# Patient Record
Sex: Male | Born: 2000 | Race: Black or African American | Hispanic: No | Marital: Single
Health system: Southern US, Academic
[De-identification: ages and names within clinical notes are randomized; demographics above are authoritative.]

## PROBLEM LIST (undated history)

## (undated) ENCOUNTER — Encounter

## (undated) ENCOUNTER — Ambulatory Visit: Payer: PRIVATE HEALTH INSURANCE

## (undated) ENCOUNTER — Telehealth: Attending: Pediatrics | Primary: Pediatrics

## (undated) ENCOUNTER — Telehealth

## (undated) ENCOUNTER — Telehealth: Attending: Pediatric Gastroenterology | Primary: Pediatric Gastroenterology

## (undated) ENCOUNTER — Encounter: Attending: "Endocrinology | Primary: "Endocrinology

## (undated) ENCOUNTER — Non-Acute Institutional Stay
Payer: PRIVATE HEALTH INSURANCE | Attending: Student in an Organized Health Care Education/Training Program | Primary: Student in an Organized Health Care Education/Training Program

## (undated) ENCOUNTER — Ambulatory Visit

## (undated) ENCOUNTER — Telehealth: Attending: Family | Primary: Family

## (undated) ENCOUNTER — Inpatient Hospital Stay

## (undated) ENCOUNTER — Encounter
Attending: Student in an Organized Health Care Education/Training Program | Primary: Student in an Organized Health Care Education/Training Program

## (undated) ENCOUNTER — Encounter: Payer: PRIVATE HEALTH INSURANCE | Attending: Clinical | Primary: Clinical

## (undated) ENCOUNTER — Telehealth: Attending: Pediatric Pulmonology | Primary: Pediatric Pulmonology

## (undated) ENCOUNTER — Encounter: Attending: Family | Primary: Family

## (undated) ENCOUNTER — Encounter
Payer: PRIVATE HEALTH INSURANCE | Attending: Student in an Organized Health Care Education/Training Program | Primary: Student in an Organized Health Care Education/Training Program

## (undated) ENCOUNTER — Encounter: Attending: Pediatrics | Primary: Pediatrics

## (undated) ENCOUNTER — Ambulatory Visit
Payer: PRIVATE HEALTH INSURANCE | Attending: Student in an Organized Health Care Education/Training Program | Primary: Student in an Organized Health Care Education/Training Program

## (undated) ENCOUNTER — Encounter: Payer: PRIVATE HEALTH INSURANCE | Attending: Pediatric Gastroenterology | Primary: Pediatric Gastroenterology

## (undated) ENCOUNTER — Non-Acute Institutional Stay: Payer: PRIVATE HEALTH INSURANCE

## (undated) ENCOUNTER — Encounter: Attending: Pediatric Gastroenterology | Primary: Pediatric Gastroenterology

## (undated) ENCOUNTER — Encounter: Payer: PRIVATE HEALTH INSURANCE | Attending: Nutritionist | Primary: Nutritionist

## (undated) ENCOUNTER — Ambulatory Visit: Payer: PRIVATE HEALTH INSURANCE | Attending: Pediatric Gastroenterology | Primary: Pediatric Gastroenterology

## (undated) ENCOUNTER — Encounter: Attending: Anesthesiology | Primary: Anesthesiology

## (undated) ENCOUNTER — Ambulatory Visit: Payer: PRIVATE HEALTH INSURANCE | Attending: Family | Primary: Family

## (undated) ENCOUNTER — Telehealth: Attending: Certified Registered" | Primary: Certified Registered"

## (undated) ENCOUNTER — Encounter: Payer: PRIVATE HEALTH INSURANCE | Attending: Dermatology | Primary: Dermatology

## (undated) ENCOUNTER — Encounter: Attending: Certified Registered" | Primary: Certified Registered"

## (undated) ENCOUNTER — Encounter: Payer: PRIVATE HEALTH INSURANCE | Attending: Family | Primary: Family

## (undated) ENCOUNTER — Encounter: Payer: PRIVATE HEALTH INSURANCE | Attending: "Endocrinology | Primary: "Endocrinology

## (undated) DIAGNOSIS — I8500 Esophageal varices without bleeding: Secondary | ICD-10-CM

## (undated) DIAGNOSIS — K7460 Unspecified cirrhosis of liver: Secondary | ICD-10-CM

## (undated) DIAGNOSIS — Z944 Liver transplant status: Secondary | ICD-10-CM

## (undated) DIAGNOSIS — K200 Eosinophilic esophagitis: Secondary | ICD-10-CM

## (undated) DIAGNOSIS — R740 Nonspecific elevation of levels of transaminase and lactic acid dehydrogenase [LDH]: Secondary | ICD-10-CM

## (undated) DIAGNOSIS — J302 Other seasonal allergic rhinitis: Secondary | ICD-10-CM

## (undated) DIAGNOSIS — I8501 Esophageal varices with bleeding: Secondary | ICD-10-CM

## (undated) DIAGNOSIS — D573 Sickle-cell trait: Secondary | ICD-10-CM

## (undated) DIAGNOSIS — R7401 Elevation of levels of liver transaminase levels: Secondary | ICD-10-CM

## (undated) DIAGNOSIS — F329 Major depressive disorder, single episode, unspecified: Secondary | ICD-10-CM

## (undated) DIAGNOSIS — F32A Depression, unspecified: Secondary | ICD-10-CM

## (undated) DIAGNOSIS — T50902A Poisoning by unspecified drugs, medicaments and biological substances, intentional self-harm, initial encounter: Secondary | ICD-10-CM

## (undated) HISTORY — PX: TIPS PROCEDURE: SHX808

## (undated) HISTORY — PX: LIVER TRANSPLANT: SHX410

## (undated) HISTORY — PX: OTHER SURGICAL HISTORY: SHX169

---
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
## 2019-01-06 ENCOUNTER — Other Ambulatory Visit (HOSPITAL_COMMUNITY)
Admission: AD | Admit: 2019-01-06 | Discharge: 2019-01-06 | Disposition: A | Discharge status: Home / Self Care | Payer: Medicaid Other | Source: Ambulatory Visit | Attending: Pediatric Gastroenterology | Admitting: Pediatric Gastroenterology

## 2019-01-06 ENCOUNTER — Other Ambulatory Visit (HOSPITAL_COMMUNITY)
Admission: AD | Admit: 2019-01-06 | Payer: Medicaid Other | Source: Ambulatory Visit | Admitting: Pediatric Gastroenterology

## 2019-01-06 DIAGNOSIS — Z944 Liver transplant status: Secondary | ICD-10-CM | POA: Diagnosis present

## 2019-01-06 LAB — CBC WITH DIFFERENTIAL/PLATELET
Abs Immature Granulocytes: 0 10*3/uL (ref 0.00–0.07)
Basophils Absolute: 0 10*3/uL (ref 0.0–0.1)
Basophils Relative: 1 %
EOS ABS: 0 10*3/uL (ref 0.0–0.5)
EOS PCT: 1 %
HCT: 43.2 % (ref 39.0–52.0)
Hemoglobin: 14 g/dL (ref 13.0–17.0)
Immature Granulocytes: 0 %
Lymphocytes Relative: 29 %
Lymphs Abs: 0.4 10*3/uL — ABNORMAL LOW (ref 0.7–4.0)
MCH: 24.2 pg — ABNORMAL LOW (ref 26.0–34.0)
MCHC: 32.4 g/dL (ref 30.0–36.0)
MCV: 74.7 fL — ABNORMAL LOW (ref 80.0–100.0)
Monocytes Absolute: 0.3 10*3/uL (ref 0.1–1.0)
Monocytes Relative: 20 %
Neutro Abs: 0.7 10*3/uL — ABNORMAL LOW (ref 1.7–7.7)
Neutrophils Relative %: 49 %
Platelets: 190 10*3/uL (ref 150–400)
RBC: 5.78 MIL/uL (ref 4.22–5.81)
RDW: 13.2 % (ref 11.5–15.5)
WBC Morphology: INCREASED
WBC: 1.5 10*3/uL — ABNORMAL LOW (ref 4.0–10.5)
nRBC: 0 % (ref 0.0–0.2)

## 2019-01-06 LAB — COMPREHENSIVE METABOLIC PANEL
ALT: 109 U/L — ABNORMAL HIGH (ref 0–44)
AST: 89 U/L — ABNORMAL HIGH (ref 15–41)
Albumin: 4.1 g/dL (ref 3.5–5.0)
Alkaline Phosphatase: 293 U/L — ABNORMAL HIGH (ref 38–126)
Anion gap: 11 (ref 5–15)
BUN: 5 mg/dL — ABNORMAL LOW (ref 6–20)
CO2: 25 mmol/L (ref 22–32)
Calcium: 9.2 mg/dL (ref 8.9–10.3)
Chloride: 104 mmol/L (ref 98–111)
Creatinine, Ser: 1.15 mg/dL (ref 0.61–1.24)
GFR calc Af Amer: >60 mL/min (ref >60)
GFR calc non Af Amer: >60 mL/min (ref >60)
Glucose, Bld: 107 mg/dL — ABNORMAL HIGH (ref 70–99)
POTASSIUM: 4 mmol/L (ref 3.5–5.1)
Sodium: 140 mmol/L (ref 135–145)
Total Bilirubin: 0.8 mg/dL (ref 0.3–1.2)
Total Protein: 7.5 g/dL (ref 6.5–8.1)

## 2019-01-06 LAB — MAGNESIUM: Magnesium: 1.5 mg/dL — ABNORMAL LOW (ref 1.7–2.4)

## 2019-01-06 LAB — PHOSPHORUS: Phosphorus: 4.6 mg/dL (ref 2.5–4.6)

## 2019-01-06 LAB — GAMMA GT: GGT: 132 U/L — ABNORMAL HIGH (ref 7–50)

## 2019-01-08 LAB — TACROLIMUS LEVEL: Tacrolimus (FK506) - LabCorp: 14.7 ng/mL (ref 2.0–20.0)

## 2019-01-08 LAB — PATHOLOGIST SMEAR REVIEW

## 2019-01-11 ENCOUNTER — Encounter: Payer: Self-pay | Admitting: Pediatrics

## 2019-01-11 ENCOUNTER — Ambulatory Visit (INDEPENDENT_AMBULATORY_CARE_PROVIDER_SITE_OTHER): Payer: Medicaid Other | Admitting: Pediatrics

## 2019-01-11 ENCOUNTER — Emergency Department (HOSPITAL_COMMUNITY): Payer: Medicaid Other

## 2019-01-11 ENCOUNTER — Emergency Department (HOSPITAL_COMMUNITY)
Admission: EM | Admit: 2019-01-11 | Discharge: 2019-01-11 | Disposition: A | Discharge status: Home / Self Care | Payer: Medicaid Other | Attending: Emergency Medicine | Admitting: Emergency Medicine

## 2019-01-11 ENCOUNTER — Other Ambulatory Visit: Payer: Self-pay

## 2019-01-11 ENCOUNTER — Encounter (HOSPITAL_COMMUNITY): Payer: Self-pay

## 2019-01-11 VITALS — BP 98/78 | HR 112 | Temp 98.2°F | Resp 24 | Wt 122.2 lb

## 2019-01-11 DIAGNOSIS — D849 Immunodeficiency, unspecified: Secondary | ICD-10-CM

## 2019-01-11 DIAGNOSIS — Z7982 Long term (current) use of aspirin: Secondary | ICD-10-CM | POA: Diagnosis not present

## 2019-01-11 DIAGNOSIS — J069 Acute upper respiratory infection, unspecified: Secondary | ICD-10-CM | POA: Insufficient documentation

## 2019-01-11 DIAGNOSIS — R509 Fever, unspecified: Secondary | ICD-10-CM | POA: Diagnosis not present

## 2019-01-11 DIAGNOSIS — D899 Disorder involving the immune mechanism, unspecified: Secondary | ICD-10-CM

## 2019-01-11 DIAGNOSIS — Z79899 Other long term (current) drug therapy: Secondary | ICD-10-CM | POA: Insufficient documentation

## 2019-01-11 LAB — URINALYSIS, ROUTINE W REFLEX MICROSCOPIC
Bilirubin Urine: NEGATIVE
Glucose, UA: NEGATIVE mg/dL
Hgb urine dipstick: NEGATIVE
Ketones, ur: NEGATIVE mg/dL
Leukocytes,Ua: NEGATIVE
Nitrite: NEGATIVE
Protein, ur: NEGATIVE mg/dL
Specific Gravity, Urine: 1.012 (ref 1.005–1.030)
pH: 5 (ref 5.0–8.0)

## 2019-01-11 LAB — CBC WITH DIFFERENTIAL/PLATELET
Abs Immature Granulocytes: 0 10*3/uL (ref 0.00–0.07)
Basophils Absolute: 0 10*3/uL (ref 0.0–0.1)
Basophils Relative: 0 %
Eosinophils Absolute: 0 10*3/uL (ref 0.0–0.5)
Eosinophils Relative: 1 %
HCT: 42 % (ref 39.0–52.0)
Hemoglobin: 13.7 g/dL (ref 13.0–17.0)
Lymphocytes Relative: 46 %
Lymphs Abs: 0.9 10*3/uL (ref 0.7–4.0)
MCH: 24.2 pg — ABNORMAL LOW (ref 26.0–34.0)
MCHC: 32.6 g/dL (ref 30.0–36.0)
MCV: 74.1 fL — ABNORMAL LOW (ref 80.0–100.0)
Monocytes Absolute: 0.2 10*3/uL (ref 0.1–1.0)
Monocytes Relative: 9 %
NRBC: 0 % (ref 0.0–0.2)
Neutro Abs: 0.9 10*3/uL — ABNORMAL LOW (ref 1.7–7.7)
Neutrophils Relative %: 44 %
Platelets: 101 10*3/uL — ABNORMAL LOW (ref 150–400)
RBC: 5.67 MIL/uL (ref 4.22–5.81)
RDW: 13.2 % (ref 11.5–15.5)
WBC: 2 10*3/uL — ABNORMAL LOW (ref 4.0–10.5)

## 2019-01-11 LAB — COMPREHENSIVE METABOLIC PANEL
ALT: 93 U/L — ABNORMAL HIGH (ref 0–44)
AST: 100 U/L — ABNORMAL HIGH (ref 15–41)
Albumin: 3.7 g/dL (ref 3.5–5.0)
Alkaline Phosphatase: 314 U/L — ABNORMAL HIGH (ref 38–126)
Anion gap: 8 (ref 5–15)
BUN: 5 mg/dL — ABNORMAL LOW (ref 6–20)
CO2: 27 mmol/L (ref 22–32)
Calcium: 8.9 mg/dL (ref 8.9–10.3)
Chloride: 102 mmol/L (ref 98–111)
Creatinine, Ser: 1.2 mg/dL (ref 0.61–1.24)
GFR calc non Af Amer: >60 mL/min (ref >60)
Glucose, Bld: 96 mg/dL (ref 70–99)
POTASSIUM: 3.3 mmol/L — AB (ref 3.5–5.1)
Sodium: 137 mmol/L (ref 135–145)
Total Bilirubin: 1.5 mg/dL — ABNORMAL HIGH (ref 0.3–1.2)
Total Protein: 7 g/dL (ref 6.5–8.1)

## 2019-01-11 LAB — RESPIRATORY PANEL BY PCR
Adenovirus: NOT DETECTED
Bordetella pertussis: NOT DETECTED
CHLAMYDOPHILA PNEUMONIAE-RVPPCR: NOT DETECTED
Coronavirus 229E: NOT DETECTED
Coronavirus HKU1: NOT DETECTED
Coronavirus NL63: NOT DETECTED
Coronavirus OC43: NOT DETECTED
Influenza A: NOT DETECTED
Influenza B: NOT DETECTED
Metapneumovirus: NOT DETECTED
Mycoplasma pneumoniae: NOT DETECTED
Parainfluenza Virus 1: NOT DETECTED
Parainfluenza Virus 2: NOT DETECTED
Parainfluenza Virus 3: NOT DETECTED
Parainfluenza Virus 4: NOT DETECTED
RESPIRATORY SYNCYTIAL VIRUS-RVPPCR: NOT DETECTED
Rhinovirus / Enterovirus: NOT DETECTED

## 2019-01-11 LAB — PROTIME-INR
INR: 1.1 (ref 0.8–1.2)
Prothrombin Time: 14.3 seconds (ref 11.4–15.2)

## 2019-01-11 LAB — LACTIC ACID, PLASMA: LACTIC ACID, VENOUS: 1.4 mmol/L (ref 0.5–1.9)

## 2019-01-11 LAB — POC INFLUENZA A&B (BINAX/QUICKVUE)
Influenza A, POC: NEGATIVE
Influenza B, POC: NEGATIVE

## 2019-01-11 LAB — GROUP A STREP BY PCR: Group A Strep by PCR: NOT DETECTED

## 2019-01-11 LAB — MONONUCLEOSIS SCREEN: Mono Screen: NEGATIVE

## 2019-01-11 LAB — AMMONIA: Ammonia: 42 umol/L — ABNORMAL HIGH (ref 9–35)

## 2019-01-11 MED ORDER — SODIUM CHLORIDE 0.9 % IV SOLN
500.0000 mg | Freq: Once | INTRAVENOUS | Status: AC
  Administered 2019-01-11: 500 mg via INTRAVENOUS
  Filled 2019-01-11: qty 500

## 2019-01-11 MED ORDER — ONDANSETRON HCL 4 MG/2ML IJ SOLN
4.0000 mg | Freq: Once | INTRAMUSCULAR | Status: AC
  Administered 2019-01-11: 4 mg via INTRAVENOUS
  Filled 2019-01-11: qty 2

## 2019-01-11 MED ORDER — SODIUM CHLORIDE 0.9 % IV SOLN
1.0000 g | Freq: Once | INTRAVENOUS | Status: AC
  Administered 2019-01-11: 1 g via INTRAVENOUS
  Filled 2019-01-11: qty 10

## 2019-01-11 MED ORDER — AZITHROMYCIN 250 MG PO TABS: 250.0000 mg | ORAL_TABLET | Freq: Every day | ORAL | 0 refills | Status: DC

## 2019-01-11 MED ORDER — SODIUM CHLORIDE 0.9 % IV BOLUS
1000.0000 mL | Freq: Once | INTRAVENOUS | Status: AC
  Administered 2019-01-11: 1000 mL via INTRAVENOUS

## 2019-01-11 NOTE — Addendum Note (Signed)
Addended by: Theadore Nan on: 01/11/2019 02:11 PM   Modules accepted: Level of Service

## 2019-01-11 NOTE — ED Notes (Signed)
ED Provider at bedside. 

## 2019-01-11 NOTE — Discharge Instructions (Addendum)
Call the transplant center at [name] for a follow-up appointment on Thursday, January 13, 2019 or sooner as needed  Continue all of your other medications including your antirejection medications

## 2019-01-11 NOTE — ED Triage Notes (Addendum)
Pt here with mom- has been running fever- hx of liver transplant in 2017. Fever this morning 101.2. Liver enzymes were elevated per mom last week. Mother gave 325 of tylenol around 0830.

## 2019-01-11 NOTE — ED Notes (Signed)
Patient verbalizes understanding of discharge instructions. Opportunity for questioning and answers were provided. Armband removed by staff, pt discharged from ED ambulatory to home.  

## 2019-01-11 NOTE — Assessment & Plan Note (Signed)
-  Stable to transport by wheel-chair to ED

## 2019-01-11 NOTE — Progress Notes (Signed)
  Subjective:     Patient ID: Joel Peterson, male   DOB: Jul 01, 2001, 18 y.o.   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 He is accompanied by his mother today who provided the majority of history.  Mother says Joel Peterson has avoided the hospital for the last 6 months. His last appointment with [facility] ped GI was in December of 2019.  He is adherent to his Prograf and Myfortic for autoimmune hepatitis.  Onset of general malaise, severe sore thraot began 2 days ago. He also felt chills and mother reports a TMAX of 101.56F 2 hours prior to being seen (she did give 325 mg Tylenol prior to arrival). She called the transplant clinic and was advised to be seen here. Ronrico denies SOB, CP, N/V/D, abdominal pain.     Objective:   Vitals:   01/11/19 1006  BP: 98/78  Pulse: (!) 112  Resp: (!) 24  Temp: 98.2 F (36.8 C)   General: well nourished, well developed, NAD with non-toxic appearance HEENT: normocephalic, atraumatic, moist mucous membranes Neck: supple, non-tender without lymphadenopathy Cardiovascular: regular rate and rhythm without murmurs, rubs, or gallops Lungs: clear to auscultation bilaterally with normal work of breathing Abdomen: soft, non-tender, non-distended, normoactive bowel sounds Skin: warm, dry, no rashes or lesions, cap refill < 2 seconds Extremities: warm and well perfused, normal tone, no edema    Assessment:     Adair is presenting with fever with immunosuppression. He has generalized symptoms of feeling unwell with clear signs of infect based on vitals with tachycardia and tachypnea, MAP 85. He is afebrile at present but has received antipyretics. No history of volume loss. He is clearly tired appearing and needs evaluation in the ED for infectious work-up.    Plan:      Fever of unknown origin -Stable to transport by wheel-chair to ED  Durward Parcel, DO [facility], PGY-3

## 2019-01-11 NOTE — ED Provider Notes (Signed)
[facility] EMERGENCY DEPARTMENT Provider Note   CSN: [id number] Arrival date & time: 01/11/19  1100    History   Chief Complaint Chief Complaint  Patient presents with  . Fever    HPI Joel Peterson is a 18 y.o. male.     HPI Patient is an 18 year old male status post liver transplant in 2017 who presents the emergency department with complaints of feeling poorly over the past several days and was found this morning to have a fever of 101.2.  He was given Tylenol by mom prior to arrival.  He reports generalized fatigue and feeling poor.  He has mild sore throat.  Some cough.  No shortness of breath.  Denies nausea vomiting diarrhea.  Denies abdominal pain.  No urinary symptoms.  No recent sick contacts.  Patient was seen in his clinic and tested for flu and was flu negative.  Patient sent to the ER by his primary care team.  No recent change in his medications.  He is compliant with his antirejection meds.   Past Medical History:  Diagnosis Date  . Cirrhosis (HCC)   . Depression   . Eosinophilic esophagitis   . Esophageal varices with bleeding (HCC)   . Seasonal allergies   . Sickle cell trait (HCC)   . Status post liver transplant (HCC)   . Suicide attempt by drug ingestion (HCC)   . Transaminitis   . Varices, esophageal [facility])     Patient Active Problem List   Diagnosis Date Noted  . Immunocompromised (HCC) 01/11/2019  . Fever of unknown origin 01/11/2019  . Electrolyte disturbance 02/06/2018  . Ingestion of substance 01/07/2018  . Acute rejection of liver transplant (HCC) 08/20/2017  . History of suicide attempt 03/30/2017  . Liver transplant recipient [facility])   . Urinary retention   . Overdose 01/20/2017  . Severe episode of recurrent major depressive disorder, without psychotic features (HCC)   . Suicide attempt (HCC)   . Eosinophilic esophagitis 09/24/2016  . Status post liver transplant (HCC) 09/22/2016  . Adjustment disorder with  anxious mood 05/20/2016  . Anemia 05/12/2016  . Hypotension due to blood loss 05/12/2016  . Bleeding esophageal varices (HCC)   . Lactic acidosis   . Cirrhosis (HCC) 05/04/2016  . Hepatic cirrhosis (HCC) 05/03/2016  . Hematemesis 05/02/2016  . Orthostatic hypotension 05/02/2016  . Transaminitis 05/02/2016  . Depression 03/19/2016  . Sickle cell trait (HCC) 06/08/2014  . BMI (body mass index), pediatric, 5% to less than 85% for age 53/16/2015  . Failed vision screen 05/25/2014  . Acne 05/25/2014  . Allergic rhinitis 02/26/2014    Past Surgical History:  Procedure Laterality Date  . esophageal banding    . LIVER TRANSPLANT    . TIPS PROCEDURE          Home Medications    Prior to Admission medications   Medication Sig Start Date End Date Taking? Authorizing Provider  aspirin EC 81 MG tablet Take 81 mg by mouth daily.    [provider]  cholestyramine light (PREVALITE) 4 g packet Take 1 packet (4 g total) by mouth 2 (two) times daily. Patient not taking: Reported on 01/19/2017 08/08/16   Earley Favor, NP  escitalopram (LEXAPRO) 5 MG tablet Take 5 mg by mouth daily. 01/09/17 04/09/17  [provider]  lactulose (CHRONULAC) 10 GM/15ML solution Take by mouth 3 (three) times daily.    [provider]  mycophenolate (MYFORTIC) 360 MG TBEC EC tablet Take 720 mg  by mouth 2 (two) times daily.    [provider]  omeprazole (PRILOSEC) 20 MG capsule Take 40 mg by mouth. 05/06/16 01/19/17  [provider]  pantoprazole (PROTONIX) 40 MG tablet Take 1 tablet (40 mg total) by mouth daily. Patient not taking: Reported on 01/19/2017 05/02/16   Mittie Bodo, MD  ranitidine (ZANTAC) 150 MG tablet Take 1 tablet (150 mg total) by mouth at bedtime. Patient not taking: Reported on 01/19/2017 05/02/16   Mittie Bodo, MD  rifaximin (XIFAXAN) 550 MG TABS tablet Take 550 mg by mouth.    [provider]  spironolactone (ALDACTONE) 50 MG tablet  Take 50 mg by mouth. 05/06/16 06/05/16  [provider]  sucralfate (CARAFATE) 1 g tablet Take 1 tablet (1 g total) by mouth 4 (four) times daily -  with meals and at bedtime. Patient not taking: Reported on 01/19/2017 05/02/16   Mittie Bodo, MD  sulfamethoxazole-trimethoprim (BACTRIM,SEPTRA) 400-80 MG tablet Take 1 tablet by mouth 3 (three) times a week. Monday Wednesday Friday    [provider]  tacrolimus (PROGRAF) 1 MG capsule Take 4-5 mg by mouth 2 (two) times daily. 5 mg in the morning; 4 mg in evening    [provider]    Family History Family History  Problem Relation Age of Onset  . Hypertension Maternal Grandmother   . Diabetes Maternal Grandmother   . Hypertension Maternal Grandfather   . Diabetes Maternal Grandfather   . Diabetes Paternal Grandmother   . Hypertension Paternal Grandmother   . Diabetes Paternal Grandfather   . Hypertension Paternal Grandfather     Social History Social History   Tobacco Use  . Smoking status: Never Smoker  . Smokeless tobacco: Never Used  Substance Use Topics  . Alcohol use: No    Alcohol/week: 0.0 standard drinks    Frequency: Never  . Drug use: No     Allergies   Pollen extract   Review of Systems Review of Systems  All other systems reviewed and are negative.    Physical Exam Updated Vital Signs BP 103/73   Pulse 67   Temp 98.3 F (36.8 C) (Oral)   Resp (!) 23   SpO2 100%   Physical Exam Vitals signs and nursing note reviewed.  Constitutional:      Appearance: He is well-developed.  HENT:     Head: Normocephalic and atraumatic.     Comments: Uvula midline.  Posterior pharynx with mild erythema.  No tonsillar exudate.  Tolerating secretions.  Oral airway patent.  Anterior neck normal. Neck:     Musculoskeletal: Normal range of motion.  Cardiovascular:     Rate and Rhythm: Normal rate and regular rhythm.     Heart sounds: Normal heart sounds.  Pulmonary:     Effort:  Pulmonary effort is normal. No respiratory distress.     Breath sounds: Normal breath sounds.  Abdominal:     General: There is no distension.     Palpations: Abdomen is soft.     Tenderness: There is no abdominal tenderness.  Musculoskeletal: Normal range of motion.  Skin:    General: Skin is warm and dry.  Neurological:     Mental Status: He is alert and oriented to person, place, and time.  Psychiatric:        Judgment: Judgment normal.      ED Treatments / Results  Labs (all labs ordered are listed, but only abnormal results are displayed) Labs Reviewed  COMPREHENSIVE METABOLIC  PANEL - Abnormal; Notable for the following components:      Result Value   Potassium 3.3 (*)    BUN 5 (*)    AST 100 (*)    ALT 93 (*)    Alkaline Phosphatase 314 (*)    Total Bilirubin 1.5 (*)    All other components within normal limits  CBC WITH DIFFERENTIAL/PLATELET - Abnormal; Notable for the following components:   WBC 2.0 (*)    MCV 74.1 (*)    MCH 24.2 (*)    Platelets 101 (*)    Neutro Abs 0.9 (*)    All other components within normal limits  AMMONIA - Abnormal; Notable for the following components:   Ammonia 42 (*)    All other components within normal limits  GROUP A STREP BY PCR  CULTURE, BLOOD (ROUTINE X 2)  CULTURE, BLOOD (ROUTINE X 2)  CULTURE, GROUP A STREP ([facility])  RESPIRATORY PANEL BY PCR  PROTIME-INR  LACTIC ACID, PLASMA  MONONUCLEOSIS SCREEN  URINALYSIS, ROUTINE W REFLEX MICROSCOPIC    EKG None  Radiology Dg Chest 2 View  Result Date: 01/11/2019 CLINICAL DATA:  Fever with immunosuppression. Weakness. EXAM: CHEST - 2 VIEW COMPARISON:  Thoracic spine radiographs 05/16/2013 FINDINGS: The cardiomediastinal silhouette is within normal limits. The lungs are well inflated. There is mild hazy right infrahilar density. The left lung is clear. No pleural effusion or pneumothorax is identified. Surgical clips are present in the upper abdomen. No acute osseous abnormality is  seen. IMPRESSION: Hazy right infrahilar density which could reflect early pneumonia. Electronically Signed   By: Sebastian Ache M.D.   On: 01/11/2019 12:25    Procedures Procedures (including critical care time)  Medications Ordered in ED Medications  sodium chloride 0.9 % bolus 1,000 mL (1,000 mLs Intravenous New Bag/Given 01/11/19 1228)  ondansetron (ZOFRAN) injection 4 mg (4 mg Intravenous Given 01/11/19 1227)     Initial Impression / Assessment and Plan / ED Course  I have reviewed the triage vital signs and the nursing notes.  Pertinent labs & imaging results that were available during my care of the patient were reviewed by me and considered in my medical decision making (see chart for details).        Come the emergency department demonstrates possible hazy opacity which could represent pneumonia in the setting of fever cough and immunosuppression.  IV antibiotics now.  Case will be discussed with his transplant team at [facility].  Awaiting callback at this time.  Patient is flu negative.  Strep negative.  Mono negative.  Respiratory viral panel pending.  4:07 PM Spoke to transplant team at [facility]. They will follow up the patient on Thursday March 5th or sooner as needed.  They agree with azithromycin as an outpatient.  Viral respiratory panel pending.     Final Clinical Impressions(s) / ED Diagnoses   Final diagnoses:  Fever, unspecified fever cause  Upper respiratory tract infection, unspecified type    ED Discharge Orders    None       Azalia Bilis, MD 01/11/19 1609

## 2019-01-11 NOTE — Progress Notes (Signed)
History of liver transplant on immunosuppressive therapy with new fever documented today.  On exam patient seems tired does not currently have a fever. Review of recent labs shows ANC less than 1000, increasing heart rate increasing respiratory rate.   High risk patient for sepsis due to immunosuppression.  Transfer to emergency room for further evaluation, treatment and probable admission

## 2019-01-16 LAB — CULTURE, BLOOD (ROUTINE X 2)
Culture: NO GROWTH
Culture: NO GROWTH
Special Requests: ADEQUATE

## 2019-01-21 ENCOUNTER — Encounter
Admit: 2019-01-21 | Discharge: 2019-01-22 | Payer: PRIVATE HEALTH INSURANCE | Attending: Pediatric Gastroenterology | Primary: Pediatric Gastroenterology

## 2019-01-21 DIAGNOSIS — J302 Other seasonal allergic rhinitis: Principal | ICD-10-CM

## 2019-01-21 DIAGNOSIS — D573 Sickle-cell trait: Principal | ICD-10-CM

## 2019-01-21 DIAGNOSIS — F329 Major depressive disorder, single episode, unspecified: Principal | ICD-10-CM

## 2019-01-21 DIAGNOSIS — Z944 Liver transplant status: Principal | ICD-10-CM

## 2019-01-21 DIAGNOSIS — T8641 Liver transplant rejection: Principal | ICD-10-CM

## 2019-01-21 DIAGNOSIS — Z4823 Encounter for aftercare following liver transplant: Principal | ICD-10-CM

## 2019-01-26 DIAGNOSIS — Z79899 Other long term (current) drug therapy: Principal | ICD-10-CM

## 2019-01-26 DIAGNOSIS — B259 Cytomegaloviral disease, unspecified: Principal | ICD-10-CM

## 2019-01-26 DIAGNOSIS — Z944 Liver transplant status: Principal | ICD-10-CM

## 2019-01-26 MED ORDER — MYCOPHENOLATE SODIUM 180 MG TABLET,DELAYED RELEASE
ORAL_TABLET | Freq: Two times a day (BID) | ORAL | 11 refills | 0 days | Status: CP
Start: 2019-01-26 — End: ?

## 2019-01-26 MED ORDER — VALGANCICLOVIR 450 MG TABLET
ORAL_TABLET | Freq: Two times a day (BID) | ORAL | 2 refills | 0 days | Status: CP
Start: 2019-01-26 — End: 2019-04-26

## 2019-01-31 DIAGNOSIS — Z944 Liver transplant status: Principal | ICD-10-CM

## 2019-01-31 DIAGNOSIS — B259 Cytomegaloviral disease, unspecified: Principal | ICD-10-CM

## 2019-01-31 DIAGNOSIS — Z79899 Other long term (current) drug therapy: Principal | ICD-10-CM

## 2019-02-07 DIAGNOSIS — Z944 Liver transplant status: Principal | ICD-10-CM

## 2019-02-07 DIAGNOSIS — B259 Cytomegaloviral disease, unspecified: Principal | ICD-10-CM

## 2019-02-07 DIAGNOSIS — Z79899 Other long term (current) drug therapy: Principal | ICD-10-CM

## 2019-03-04 ENCOUNTER — Ambulatory Visit (INDEPENDENT_AMBULATORY_CARE_PROVIDER_SITE_OTHER): Payer: Medicaid Other | Admitting: Pediatrics

## 2019-03-04 ENCOUNTER — Other Ambulatory Visit: Payer: Self-pay

## 2019-03-04 ENCOUNTER — Encounter: Payer: Self-pay | Admitting: Pediatrics

## 2019-03-04 DIAGNOSIS — J029 Acute pharyngitis, unspecified: Secondary | ICD-10-CM | POA: Diagnosis not present

## 2019-03-04 MED ORDER — FLUTICASONE PROPIONATE 50 MCG/ACT NA SUSP: 1.0000 | Freq: Every day | NASAL | 12 refills | Status: AC

## 2019-03-04 MED ORDER — CETIRIZINE HCL 10 MG PO TABS: 10.0000 mg | ORAL_TABLET | Freq: Every day | ORAL | 11 refills | Status: AC | PRN

## 2019-03-04 NOTE — Progress Notes (Signed)
Virtual Visit via Video Note  I connected with Joel Peterson on 03/04/19 at  3:00 PM EDT by a video enabled telemedicine application and verified that I am speaking with the correct person using two identifiers.   Location of patient/parent: home   I discussed the limitations of evaluation and management by telemedicine and the availability of in person appointments.  I discussed that the purpose of this phone visit is to provide medical care while limiting exposure to the novel coronavirus.  The patient expressed understanding and agreed to proceed.  Reason for visit: sore throat  History of Present Illness: Throat hurts when he talks for the past week or so.  No pain with swallowing. He is hoarse also.  No  Cough, congestion, runny nose or fever.  No headaches or stomachaches.  No change in appetite or activity. He has been drinking tea with honey which helps a little.  Mom thinks he might have allergies. No medications tried at home.  Mom is worried because he has a history of liver transplant and is on chronic immunosuppression.  Mom reports that Joel Peterson recently was sick with fever and diagnosed with pneumonia about 2 months ago.  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  Mom also reports that Joel Peterson doesn't talk to her about his health or tell her what is going on until his symptoms get worse.   Observations/Objective: Well appearing adolescent male.  Limited exam of oropharynx with video shows mild erythema of the posterior oropharynx and no exudates.  Otherwise normal oral mucosa.  No nasal discharge.  Assessment and Plan: 18 year old male with history of liver transplant and chronic immune suppresion now with symptoms consistent with laryngitis - may be due to viral illness vs allergies.  Given history of allergic rhinitis, will trial cetirizine and flonase for possible allergic trigger.  No sign of systemic infection at this time.  Supportive cares, return precautions, and emergency procedures reviewed.  Follow Up Instructions: prn    I discussed the assessment and treatment plan with the patient and/or parent/guardian. They were provided an opportunity to ask questions and all were answered. They agreed with the plan and demonstrated an understanding of the instructions.   They were advised to call back or seek an in-person evaluation in the emergency room if the symptoms worsen or if the condition fails to improve as anticipated.  I provided 20 minutes of non-face-to-face time and 4 minutes of care coordination during this encounter I was located at clinic during this encounter.  Clifton Custard, MD

## 2019-07-18 DIAGNOSIS — Z944 Liver transplant status: Secondary | ICD-10-CM

## 2019-07-25 DIAGNOSIS — Z944 Liver transplant status: Secondary | ICD-10-CM

## 2019-07-25 DIAGNOSIS — B259 Cytomegaloviral disease, unspecified: Secondary | ICD-10-CM

## 2019-07-25 DIAGNOSIS — Z79899 Other long term (current) drug therapy: Secondary | ICD-10-CM

## 2019-07-26 DIAGNOSIS — Z944 Liver transplant status: Secondary | ICD-10-CM

## 2019-07-26 DIAGNOSIS — B259 Cytomegaloviral disease, unspecified: Secondary | ICD-10-CM

## 2019-07-26 DIAGNOSIS — Z79899 Other long term (current) drug therapy: Secondary | ICD-10-CM

## 2019-07-26 MED ORDER — TACROLIMUS 1 MG CAPSULE
ORAL_CAPSULE | Freq: Two times a day (BID) | ORAL | 11 refills | 30.00000 days | Status: CP
Start: 2019-07-26 — End: 2019-08-02

## 2019-07-26 MED ORDER — TACROLIMUS 5 MG CAPSULE
ORAL_CAPSULE | Freq: Two times a day (BID) | ORAL | 11 refills | 30.00000 days | Status: CP
Start: 2019-07-26 — End: 2019-08-02

## 2019-07-26 MED ORDER — MYCOPHENOLATE SODIUM 180 MG TABLET,DELAYED RELEASE
ORAL_TABLET | Freq: Two times a day (BID) | ORAL | 11 refills | 30.00000 days | Status: CP
Start: 2019-07-26 — End: 2019-08-02

## 2019-08-02 DIAGNOSIS — B259 Cytomegaloviral disease, unspecified: Secondary | ICD-10-CM

## 2019-08-02 DIAGNOSIS — Z944 Liver transplant status: Secondary | ICD-10-CM

## 2019-08-02 DIAGNOSIS — Z79899 Other long term (current) drug therapy: Secondary | ICD-10-CM

## 2019-08-02 MED ORDER — TACROLIMUS 1 MG CAPSULE
ORAL_CAPSULE | Freq: Two times a day (BID) | ORAL | 11 refills | 30.00000 days | Status: CP
Start: 2019-08-02 — End: 2019-08-03
  Filled 2019-08-03: qty 60, 30d supply, fill #0

## 2019-08-02 MED ORDER — MYCOPHENOLATE SODIUM 180 MG TABLET,DELAYED RELEASE
ORAL_TABLET | Freq: Two times a day (BID) | ORAL | 11 refills | 30.00000 days | Status: CP
Start: 2019-08-02 — End: 2019-08-03
  Filled 2019-08-03: qty 180, 30d supply, fill #0

## 2019-08-02 MED ORDER — TACROLIMUS 5 MG CAPSULE
ORAL_CAPSULE | Freq: Two times a day (BID) | ORAL | 11 refills | 30 days | Status: CP
Start: 2019-08-02 — End: 2019-08-03

## 2019-08-03 DIAGNOSIS — Z79899 Other long term (current) drug therapy: Secondary | ICD-10-CM

## 2019-08-03 DIAGNOSIS — B259 Cytomegaloviral disease, unspecified: Secondary | ICD-10-CM

## 2019-08-03 DIAGNOSIS — Z944 Liver transplant status: Secondary | ICD-10-CM

## 2019-08-03 MED ORDER — MYCOPHENOLATE SODIUM 180 MG TABLET,DELAYED RELEASE
ORAL_TABLET | Freq: Two times a day (BID) | ORAL | 11 refills | 30.00000 days | Status: SS
Start: 2019-08-03 — End: ?

## 2019-08-03 MED ORDER — TACROLIMUS 5 MG CAPSULE
ORAL_CAPSULE | Freq: Two times a day (BID) | ORAL | 11 refills | 30 days | Status: SS
Start: 2019-08-03 — End: ?

## 2019-08-03 MED FILL — TACROLIMUS 5 MG CAPSULE: 30 days supply | Qty: 60 | Fill #0 | Status: AC

## 2019-08-03 MED FILL — MYCOPHENOLATE SODIUM 180 MG TABLET,DELAYED RELEASE: 30 days supply | Qty: 180 | Fill #0 | Status: AC

## 2019-08-03 NOTE — Unmapped (Signed)
[facility]   Patient Onboarding/Medication Counseling    [name] Gross is a 18 y.o. male with liver transplant who I am counseling today on continuation of therapy.  I am speaking to the patient.    Verified patient's date of birth / HIPAA.    Specialty medication(s) to be sent: Transplant:  mycophenolic acid 180mg  and tacrolimus 5mg       Non-specialty medications/supplies to be sent: n/a      Medications not needed at this time: na         Myfortic (mycophenolic acid)    Medication & Administration     Dosage:   ? rx sent over says: 180mg  tablets take 2 tablets (360mg ) twice daily  ? Pt states he takes 180mg  tablets: 3 tablets (540mg ) twice daily  ? Contacting clinic to clarify today    Administration:   ? Take with or without food, although taking with food helps minimize GI side effects.  ? Swallow the pills whole, do not chew or crush    Adherence/Missed dose instructions:  ? Take a missed dose as soon as you think about it.  ? If it is less than 2 hours until your next dose, skip the missed dose and go back to your normal time.  ? Do not take 2 doses at the same time or extra doses.    Goals of Therapy     ? To prevent organ rejection    Side Effects & Monitoring Parameters     ? Common side effects  ? Back or joint pain  ? Constipation  ? Headache/dizziness  ? Not hungry  ? Stomach pain, diarrhea, constipation, gas, upset stomach, vomiting, nausea  ? Feeling tired or weak  ? Shakiness  ? Trouble sleeping  ? Increased risk of infection    ? The following side effects should be reported to the provider:  ? Allergic reaction  ? High blood sugar (confusion, feeling sleepy, more thirst, more hungry, passing urine more often, flushing, fast breathing, or breath that smells like fruit)  ? Electrolyte issues (mood changes, confusion, muscle pain or weakness, a heartbeat that does not feel normal, seizures, not hungry, or very bad upset stomach or throwing up)  ? High or low blood pressure (bad headache or dizziness, passing out, or change in eyesight)  ? Kidney issues (unable to pass urine, change in how much urine is passed, blood in the urine, or a big weight gain)  ? Skin (oozing, heat, swelling, redness, or pain), UTI and other infections   ? Chest pain or pressure  ? Abnormal heartbeat  ? Unexplained bleeding or bruising  ? Abnormal burning, numbness, or tingling  ? Muscle cramps,  ? Yellowing of skin or eyes    ? Monitoring parameters  ? Pregnancy test initially prior to treatment and 8-10 days later then as needed)  ? CBC weekly for first month then twice monthly for next 2 months, then monthly)  ? Monitor Renal and liver functions  ? Signs of organ rejection    Contraindications, Warnings, & Precautions     ? *This is a REMS drug and an FDA-approved patient medication guide will be printed with each dispensation  ? Black Box Warning: Infections   ? Black Box Warning: Lymphoproliferative disorders - risk of development of lymphoma and skin malignancy is increased  ? Black Box Warning: Use during pregnancy is associated with increased risks of first trimester pregnancy loss and congenital malformations.   ? Black  Box Warning: Females of reproductive potential should use contraception during treatment and for 6 weeks after therapy is discontinued  ? CNS depression  ? New or reactivated viral infections  ? Neutropenia  ? Male patients and/or their male partners should use effective contraception during treatment of the male patient and for at least 3 months after last dose.  ? Breastfeeding is not recommended during therapy and for 6 weeks after last dose    Drug/Food Interactions     ? Medication list reviewed in Epic. The patient was instructed to inform the care team before taking any new medications or supplements. no interactions noted that clinic is not already monitoring.   ? Do not take Echinacea while on this medication  ? Check with your doctor before getting any vaccinations (live or inactivated)    Storage, Handling Precautions, & Disposal     ? Store at room temperature  ? Keep away from children and pets  ? This drug is considered hazardous and should be handled as little as possible.  Wash hands before and after touching pills. If someone else helps with medication administration, they should wear gloves.      Current Medications (including OTC/herbals), Comorbidities and Allergies     Current Outpatient Medications   Medication Sig Dispense Refill   ??? ACCU-CHEK FASTCLIX LANCET DRUM Misc U TO CHECK BS 6 TIMES D  3   ??? blood sugar diagnostic Strp Dispense 100 blood glucose test strips, ok to sub any brand preferred by insurance/patient, use 3x/day.  Patient has accuchek guide. 100 strip 12   ??? famotidine (PEPCID) 20 MG tablet Take 1 tablet (20 mg total) by mouth Two (2) times a day. 60 tablet 0   ??? insulin glargine (LANTUS) 100 unit/mL injection Inject 0.08 mL (8 Units total) under the skin nightly. 3 mL 3   ??? insulin lispro (HUMALOG) 100 unit/mL injection Inject 0.12 mL (12 Units total) under the skin Three (3) times a day before meals. Take 6u with breakfast, 12u with lunch, & 8u with dinner. 9 mL 3   ??? insulin syringe-needle U-100 1 mL 31 gauge x 5/16 Syrg U QID  12   ??? lactulose (CHRONULAC) 10 gram/15 mL solution Take by mouth Three (3) times a day.     ??? magnesium oxide (MAG-OX) 400 mg (241.3 mg magnesium) tablet Take 2 tablets (800 mg total) by mouth Two (2) times a day. (Patient not taking: Reported on 10/13/2018) 120 tablet 11   ??? multivitamins, therapeutic with minerals 9 mg iron-400 mcg tablet Take 1 tablet by mouth daily. (Patient not taking: Reported on 10/13/2018) 60 tablet 0   ??? mycophenolate (MYFORTIC) 180 MG EC tablet Take 2 tablets (360 mg total) by mouth two (2) times a day. 120 tablet 11   ??? nystatin (MYCOSTATIN) 100,000 unit/mL suspension Take 5 mL (500,000 Units total) by mouth Four (4) times a day. (Patient not taking: Reported on 10/13/2018) 60 mL 0   ??? predniSONE (DELTASONE) 20 MG tablet Take 2 tablets (40 mg total) by mouth daily. (Patient not taking: Reported on 10/13/2018) 20 tablet 0   ??? tacrolimus (PROGRAF) 1 MG capsule Take 1 capsule (1 mg total) by mouth two (2) times a day. Take with 1 (5 mg) capsule for a total dose of 6 mg two times a day. 60 capsule 11   ??? tacrolimus (PROGRAF) 5 MG capsule Take 1 capsule (5 mg total) by mouth two (2) times a day. 60 capsule 11  Current Facility-Administered Medications   Medication Dose Route Frequency Provider Last Rate Last Dose   ??? albuterol (PROVENTIL HFA;VENTOLIN HFA) 90 mcg/actuation inhaler 2-4 puff  2-4 puff Inhalation Once Glorianne Manchester, MD           Allergies   Allergen Reactions   ??? Bee Pollen Rash     Pt has seasonal allergies that cause excessive sneezing and running nose- [name], NAII   ??? Pollen Extracts Rash     Pt has seasonal allergies that cause excessive sneezing and running noseCardell Peach       Patient Active Problem List   Diagnosis   ??? Adjustment disorder with anxious mood   ??? Recurrent major depressive disorder, in partial remission (CMS-HCC)   ??? Malnutrition of mild degree (CMS-HCC)   ??? History of liver transplant (CMS-HCC)   ??? Allergic rhinitis   ??? Sickle cell trait (CMS-HCC)   ??? Transaminitis   ??? History of suicide attempt   ??? 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 Acute rejection of liver transplant (CMS-HCC)   ??? Ingestion of multiple medications   ??? Liver transplant rejection (CMS-HCC)   ??? Anemia   ??? AKI (acute kidney injury) (CMS-HCC)   ??? Electrolyte disturbance   ??? Hypoalbuminemia   ??? H/O liver transplant (CMS-HCC)   ??? 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 High serum gamma glutamyl transferase (GGT)   ??? Steroid-induced hyperglycemia   ??? Pityrosporum folliculitis   ??? Bilateral knee swelling   ??? Cholangitis of transplanted liver (CMS-HCC)       Reviewed and up to date in Epic. Appropriateness of Therapy     Is medication and dose appropriate based on diagnosis? Yes    Baseline Quality of Life Assessment      How many days over the past month did your transplant keep you from your normal activities? 0    Financial Information     Medication Assistance provided: None Required    Anticipated copay of $0 reviewed with patient. Verified delivery address.    Delivery Information     Scheduled delivery date: 08/04/2019, however pt is aware that since shipping out of state could be longer    Expected start date: pt is currently already taking    Medication will be delivered via UPS to the prescription address in Epic [place].  This shipment will not require a signature. Got ok from [facility] and [facility] to ship to [place] address.     Explained the services we provide at [facility] and that each month we would call to set up refills.  Stressed importance of returning phone calls so that we could ensure they receive their medications in time each month.  Informed patient that we should be setting up refills 7-10 days prior to when they will run out of medication.  A pharmacist will reach out to perform a clinical assessment periodically.  Informed patient that a welcome packet and a drug information handout will be sent.      Patient verbalized understanding of the above information as well as how to contact the pharmacy at [phone] option 4 with any questions/concerns.  The pharmacy is open Monday through Friday 8:30am-4:30pm.  A pharmacist is available 24/7 via pager to answer any clinical questions they may have.    Patient Specific Needs     ? Does the patient have any physical,  cognitive, or cultural barriers? No    ? Patient prefers to have medications discussed with  Patient     ? Is the patient able to read and understand education materials at a high school level or above? Yes    ? Patient's primary language is  English     ? Is the patient high risk? Yes, patient taking a REMS drug     ? Does the patient require a Care Management Plan? No     ? Does the patient require physician intervention or other additional services (i.e. nutrition, smoking cessation, social work)? No      Thad Ranger  [facility] Specialty Pharmacist

## 2019-08-03 NOTE — Unmapped (Signed)
Call received from patient stating that he thought he needed to be admitted to hospital.  He reports black stools, feeling lightheaded and tired x 1 week.  He recently left his parent's house in [place] to stay with someone in [place].  Earlier today I coordinated with [facility] pharmacy to provide his medications while he resided there.      I discussed his options given that his insurance is [place] [employer]; discussed that if he went to a local ED they will see him and treat him regardless of insurance.  Alternatively if he felt he could tolerate the trip he could come to a [facility] hospital.  He confirmed that someone could drive him.  I identified [facility] in [place] as the nearest [facility] hospital to his current location, about 1 hour [address].  He stated he preferred this option.  I advised him to report to the ED there and to inform them of his [facility] transplant status.

## 2019-08-03 NOTE — Unmapped (Signed)
[facility]   Patient Onboarding/Medication Counseling    [name] Gross is a 18 y.o. male with liver transplant who I am counseling today on continuation of therapy.  I am speaking to the patient.    Verified patient's date of birth / HIPAA.    Specialty medication(s) to be sent: Transplant:  mycophenolic acid 180mg  and tacrolimus 5mg       Non-specialty medications/supplies to be sent: na      Medications not needed at this time: na       Prograf (tacrolimus)      [facility] Pharmacist Intervention    Type of intervention: mfg change of nti drug    Medication: tacrolimus    Problem: pt is unsure of current tac mfg    Intervention: counseled patient that we only have accord mfg available - he will need to contact clinic for possible labwork on date he switches to our mfg. I will message clinic as well    Follow up needed: n/a    Approximate time spent: 10 minutes    Thad Ranger   [facility] Specialty Pharmacist        Medication & Administration     Dosage:   rxs sent in say: 6mg  bid using 1mg  and 5mg  capsules  Pt states he only takes 5mg  bid using 5mg  caps only  Messaging coordinator and cpp now     Administration:   ? May take with or without food  ? Take 12 hours apart    Adherence/Missed dose instructions:  ? Take a missed dose as soon as you think about it.  ? If it is close to the time for your next dose, skip the missed dose and go back to your normal time.  ? Do not take 2 doses at the same time or extra doses.    Goals of Therapy     ? To prevent organ rejection    Side Effects & Monitoring Parameters     ? Common side effects  ? Dizziness  ? Fatigue  ? Headache  ? Stuffy nose or sore throat  ? Nausea, vomiting, stomach pain, diarrhea, constipation  ? Heartburn  ? Back or joint pain  ? Increased risk of infection    ? The following side effects should be reported to the provider:  ? Allergic reaction  ? Kidney issues (change in quantity or urine passed, blood in urine, or weight gain)  ? High blood pressure (dizziness, change in eyesight, headache)  ? Electrolyte issues (change in mood, confusion, muscle pain, or weakness)  ? Abnormal breathing  ? Shakiness  ? Unexplained bleeding or bruising (gums bleeding, blood in urine, nosebleeds, any abnormal bleeding)  ? Signs of infection  ? Skin changes (sores, paleness, new or changed bumps or moles)    ? Monitoring Parameters  ? Renal function  ? Liver function  ? Glucose levels  ? Blood pressure  ? Tacrolimus trough levels  ? Cardiac monitoring (for QT prolongation)      Contraindications, Warnings, & Precautions     ? Black Box Warning: Infections - immunosuppressant agents increase the risk of infection that may lead to hospitalization or death  ? Black Box Warning: Malignancy - immunosuppressant agents may be associated with the development of malignancies that may lead to hospitalization or death  ? Limit or avoid sun and ultraviolet light exposure, use appropriate sun protection  ? Myocardial hypertrophy -avoid use  in patients with congenital long QT syndrome  ? Diabetes mellitus - the risk for new-onset diabetes and insulin-dependent post-transplant diabetes mellitus is increased with tacrolimus use after transplantation  ? GI perforation  ? Hyperkalemia  ? Hypertension  ? Nephrotoxicity  ? Neurotoxicity  ? This is a narrow therapeutic index drug. Do not switch manufacturers without first talking to the provider.    Drug/Food Interactions     ? Medication list reviewed in Epic. The patient was instructed to inform the care team before taking any new medications or supplements. no interactions noted that clinic is not already monitoring.   ? Avoid alcohol  ? Avoid grapefruit or grapefruit juice  ? Avoid live vaccines    Storage, Handling Precautions, & Disposal     ? Store at room temperature  ? Keep away from children and pets      Current Medications (including OTC/herbals), Comorbidities and Allergies     Current Outpatient Medications   Medication Sig Dispense Refill   ??? ACCU-CHEK FASTCLIX LANCET DRUM Misc U TO CHECK BS 6 TIMES D  3   ??? blood sugar diagnostic Strp Dispense 100 blood glucose test strips, ok to sub any brand preferred by insurance/patient, use 3x/day.  Patient has accuchek guide. 100 strip 12   ??? famotidine (PEPCID) 20 MG tablet Take 1 tablet (20 mg total) by mouth Two (2) times a day. 60 tablet 0   ??? insulin glargine (LANTUS) 100 unit/mL injection Inject 0.08 mL (8 Units total) under the skin nightly. 3 mL 3   ??? insulin lispro (HUMALOG) 100 unit/mL injection Inject 0.12 mL (12 Units total) under the skin Three (3) times a day before meals. Take 6u with breakfast, 12u with lunch, & 8u with dinner. 9 mL 3   ??? insulin syringe-needle U-100 1 mL 31 gauge x 5/16 Syrg U QID  12   ??? lactulose (CHRONULAC) 10 gram/15 mL solution Take by mouth Three (3) times a day.     ??? magnesium oxide (MAG-OX) 400 mg (241.3 mg magnesium) tablet Take 2 tablets (800 mg total) by mouth Two (2) times a day. (Patient not taking: Reported on 10/13/2018) 120 tablet 11   ??? multivitamins, therapeutic with minerals 9 mg iron-400 mcg tablet Take 1 tablet by mouth daily. (Patient not taking: Reported on 10/13/2018) 60 tablet 0   ??? mycophenolate (MYFORTIC) 180 MG EC tablet Take 2 tablets (360 mg total) by mouth two (2) times a day. 120 tablet 11   ??? nystatin (MYCOSTATIN) 100,000 unit/mL suspension Take 5 mL (500,000 Units total) by mouth Four (4) times a day. (Patient not taking: Reported on 10/13/2018) 60 mL 0   ??? predniSONE (DELTASONE) 20 MG tablet Take 2 tablets (40 mg total) by mouth daily. (Patient not taking: Reported on 10/13/2018) 20 tablet 0   ??? tacrolimus (PROGRAF) 1 MG capsule Take 1 capsule (1 mg total) by mouth two (2) times a day. Take with 1 (5 mg) capsule for a total dose of 6 mg two times a day. 60 capsule 11   ??? tacrolimus (PROGRAF) 5 MG capsule Take 1 capsule (5 mg total) by mouth two (2) times a day. 60 capsule 11     Current Facility-Administered Medications   Medication Dose Route Frequency Provider Last Rate Last Dose   ??? albuterol (PROVENTIL HFA;VENTOLIN HFA) 90 mcg/actuation inhaler 2-4 puff  2-4 puff Inhalation Once Glorianne Manchester, MD           Allergies   Allergen  Reactions   ??? Bee Pollen Rash     Pt has seasonal allergies that cause excessive sneezing and running nose- [name], NAII   ??? Pollen Extracts Rash     Pt has seasonal allergies that cause excessive sneezing and running noseCardell Peach       Patient Active Problem List   Diagnosis   ??? Adjustment disorder with anxious mood   ??? Recurrent major depressive disorder, in partial remission (CMS-HCC)   ??? Malnutrition of mild degree (CMS-HCC)   ??? History of liver transplant (CMS-HCC)   ??? Allergic rhinitis   ??? Sickle cell trait (CMS-HCC)   ??? Transaminitis   ??? History of suicide attempt   ??? 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 Acute rejection of liver transplant (CMS-HCC)   ??? Ingestion of multiple medications   ??? Liver transplant rejection (CMS-HCC)   ??? Anemia   ??? AKI (acute kidney injury) (CMS-HCC)   ??? Electrolyte disturbance   ??? Hypoalbuminemia   ??? H/O liver transplant (CMS-HCC)   ??? 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 High serum gamma glutamyl transferase (GGT)   ??? Steroid-induced hyperglycemia   ??? Pityrosporum folliculitis   ??? Bilateral knee swelling   ??? Cholangitis of transplanted liver (CMS-HCC)       Reviewed and up to date in Epic.    Appropriateness of Therapy     Is medication and dose appropriate based on diagnosis? Yes    Baseline Quality of Life Assessment      How many days over the past month did your transplant keep you from your normal activities? none    Financial Information     Medication Assistance provided: None Required    Anticipated copay of $0 reviewed with patient. Verified delivery address.    Delivery Information     Scheduled delivery date: 08/04/2019- however pt is aware that med is going out of state so could possibly be longer    Expected start date: pt is currently already taking    Medication will be delivered via UPS to the prescription address in Epic [place].  This shipment will not require a signature.    Verified per [facility] and [facility] that ok to ship to [place] address for pt    Explained the services we provide at [facility] and that each month we would call to set up refills.  Stressed importance of returning phone calls so that we could ensure they receive their medications in time each month.  Informed patient that we should be setting up refills 7-10 days prior to when they will run out of medication.  A pharmacist will reach out to perform a clinical assessment periodically.  Informed patient that a welcome packet and a drug information handout will be sent.      Patient verbalized understanding of the above information as well as how to contact the pharmacy at [phone] option 4 with any questions/concerns.  The pharmacy is open Monday through Friday 8:30am-4:30pm.  A pharmacist is available 24/7 via pager to answer any clinical questions they may have.    Patient Specific Needs     ? Does the patient have any physical, cognitive, or cultural barriers? No    ? Patient prefers to have medications discussed with  Patient     ? Is the patient able to read and understand education materials at a high school level  or above? Yes    ? Patient's primary language is  English     ? Is the patient high risk? Yes, patient taking a REMS drug     ? Does the patient require a Care Management Plan? No     ? Does the patient require physician intervention or other additional services (i.e. nutrition, smoking cessation, social work)? No      Thad Ranger  [facility] Specialty Pharmacist

## 2019-08-03 NOTE — Unmapped (Signed)
[facility] Specialty Medication Referral: No PA required    Medication (Brand/Generic): Mycophenolate 180 mg tablets and Tacrolimus 1 mg and 5 mg capsules    Initial Benefits Investigation Claim completed with resulted information below:  No PA required  Patient ABLE to fill at [facility] Company:  [facility]  Anticipated Copay: $0.00    As Co-pay is under $25 defined limit, per policy there will be no further investigation of need for financial assistance at this time unless patient requests. This referral has been communicated to the provider and handed off to the [facility] team for further processing and filling of prescribed medication.   ______________________________________________________________________  Please utilize this referral for viewing purposes as it will serve as the central location for all relevant documentation and updates.

## 2019-08-03 NOTE — Unmapped (Signed)
Pt request for RX Refill

## 2019-08-04 ENCOUNTER — Encounter
Admit: 2019-08-04 | Discharge: 2019-09-10 | Disposition: A | Discharge status: Home / Self Care | Payer: PRIVATE HEALTH INSURANCE | Source: Other Acute Inpatient Hospital | Attending: Student in an Organized Health Care Education/Training Program | Admitting: Pediatric Gastroenterology

## 2019-08-04 ENCOUNTER — Encounter
Admit: 2019-08-04 | Discharge: 2019-09-10 | Disposition: A | Discharge status: Home / Self Care | Payer: PRIVATE HEALTH INSURANCE | Source: Other Acute Inpatient Hospital | Attending: Anesthesiology | Admitting: Pediatric Gastroenterology

## 2019-08-04 ENCOUNTER — Ambulatory Visit
Admit: 2019-08-04 | Discharge: 2019-09-10 | Disposition: A | Discharge status: Home / Self Care | Payer: PRIVATE HEALTH INSURANCE | Source: Other Acute Inpatient Hospital | Admitting: Pediatric Gastroenterology

## 2019-08-04 ENCOUNTER — Encounter
Admit: 2019-08-04 | Discharge: 2019-09-10 | Disposition: A | Discharge status: Home / Self Care | Payer: PRIVATE HEALTH INSURANCE | Source: Other Acute Inpatient Hospital | Admitting: Pediatric Gastroenterology

## 2019-08-04 LAB — CBC W/ AUTO DIFF
BASOPHILS ABSOLUTE COUNT: 0 10*9/L (ref 0.0–0.1)
EOSINOPHILS ABSOLUTE COUNT: 0.2 10*9/L (ref 0.0–0.4)
EOSINOPHILS RELATIVE PERCENT: 2.6 %
HEMATOCRIT: 37.1 % — ABNORMAL LOW (ref 41.0–53.0)
HEMOGLOBIN: 12 g/dL — ABNORMAL LOW (ref 13.5–17.5)
LARGE UNSTAINED CELLS: 2 % (ref 0–4)
LYMPHOCYTES ABSOLUTE COUNT: 2.6 10*9/L (ref 1.5–5.0)
LYMPHOCYTES RELATIVE PERCENT: 44.3 %
MEAN CORPUSCULAR HEMOGLOBIN CONC: 32.4 g/dL (ref 31.0–37.0)
MEAN CORPUSCULAR HEMOGLOBIN: 25.7 pg — ABNORMAL LOW (ref 26.0–34.0)
MEAN CORPUSCULAR VOLUME: 79.4 fL — ABNORMAL LOW (ref 80.0–100.0)
MEAN PLATELET VOLUME: 8.1 fL (ref 7.0–10.0)
MONOCYTES ABSOLUTE COUNT: 0.4 10*9/L (ref 0.2–0.8)
MONOCYTES RELATIVE PERCENT: 7.4 %
NEUTROPHILS ABSOLUTE COUNT: 2.5 10*9/L (ref 2.0–7.5)
NEUTROPHILS RELATIVE PERCENT: 43 %
PLATELET COUNT: 195 10*9/L (ref 150–440)
RED CELL DISTRIBUTION WIDTH: 21.9 % — ABNORMAL HIGH (ref 12.0–15.0)
WBC ADJUSTED: 5.9 10*9/L (ref 4.5–11.0)

## 2019-08-04 LAB — COMPREHENSIVE METABOLIC PANEL
ALBUMIN: 2.5 g/dL — ABNORMAL LOW (ref 3.5–5.0)
ALKALINE PHOSPHATASE: 1499 U/L — ABNORMAL HIGH (ref 65–260)
ALT (SGPT): 424 U/L — ABNORMAL HIGH (ref <50)
ANION GAP: 5 mmol/L — ABNORMAL LOW (ref 7–15)
AST (SGOT): 748 U/L — ABNORMAL HIGH (ref 19–55)
BILIRUBIN TOTAL: 15.5 mg/dL — ABNORMAL HIGH (ref 0.0–1.2)
BLOOD UREA NITROGEN: 8 mg/dL (ref 7–21)
BUN / CREAT RATIO: 12
CHLORIDE: 104 mmol/L (ref 98–107)
CO2: 23 mmol/L (ref 22.0–30.0)
CREATININE: 0.66 mg/dL — ABNORMAL LOW (ref 0.70–1.30)
EGFR CKD-EPI NON-AA MALE: >90 mL/min/{1.73_m2} (ref >=60)
GLUCOSE RANDOM: 78 mg/dL (ref 70–179)
POTASSIUM: 3.3 mmol/L — ABNORMAL LOW (ref 3.5–5.0)
PROTEIN TOTAL: 9.8 g/dL — ABNORMAL HIGH (ref 6.5–8.3)
SODIUM: 132 mmol/L — ABNORMAL LOW (ref 135–145)

## 2019-08-04 LAB — URINALYSIS
BLOOD UA: NEGATIVE
KETONES UA: NEGATIVE
LEUKOCYTE ESTERASE UA: NEGATIVE
NITRITE UA: NEGATIVE
PH UA: 6 (ref 5.0–9.0)
RBC UA: 2 /HPF (ref <=3)
SPECIFIC GRAVITY UA: 1.024 (ref 1.003–1.030)
SQUAMOUS EPITHELIAL: <1 /HPF (ref 0–5)
UROBILINOGEN UA: 4 — AB
WBC UA: 2 /HPF (ref <=2)

## 2019-08-04 LAB — BASIC METABOLIC PANEL
ANION GAP: 4 mmol/L — ABNORMAL LOW (ref 7–15)
BLOOD UREA NITROGEN: 8 mg/dL (ref 7–21)
BUN / CREAT RATIO: 12
CALCIUM: 7.9 mg/dL — ABNORMAL LOW (ref 8.5–10.2)
CO2: 24 mmol/L (ref 22.0–30.0)
CREATININE: 0.67 mg/dL — ABNORMAL LOW (ref 0.70–1.30)
EGFR CKD-EPI AA MALE: >90 mL/min/{1.73_m2} (ref >=60)
EGFR CKD-EPI NON-AA MALE: >90 mL/min/{1.73_m2} (ref >=60)
GLUCOSE RANDOM: 96 mg/dL (ref 70–179)
SODIUM: 135 mmol/L (ref 135–145)

## 2019-08-04 LAB — MAGNESIUM
Magnesium:MCnc:Pt:Ser/Plas:Qn:: 1.8
Magnesium:MCnc:Pt:Ser/Plas:Qn:: 2

## 2019-08-04 LAB — COLOR

## 2019-08-04 LAB — PHOSPHORUS: Phosphate:MCnc:Pt:Ser/Plas:Qn:: 3.6

## 2019-08-04 LAB — MEAN PLATELET VOLUME: Lab: 8.1

## 2019-08-04 LAB — CO2: Carbon dioxide:SCnc:Pt:Ser/Plas:Qn:: 23

## 2019-08-04 LAB — TARGET CELLS

## 2019-08-04 LAB — SLIDE REVIEW

## 2019-08-04 LAB — SODIUM: Sodium:SCnc:Pt:Ser/Plas:Qn:: 135

## 2019-08-04 LAB — PROTIME-INR: PROTIME: 19 s — ABNORMAL HIGH (ref 10.2–13.1)

## 2019-08-04 LAB — HEPATITIS B CORE IGM ANTIBODY: Hepatitis B virus core Ab.IgM:PrThr:Pt:Ser:Ord:: NONREACTIVE

## 2019-08-04 LAB — PROTIME: Coagulation tissue factor induced:Time:Pt:PPP:Qn:Coag: 19 — ABNORMAL HIGH

## 2019-08-04 LAB — HEPATITIS PANEL, ACUTE: HEPATITIS A IGM ANTIBODY: NONREACTIVE

## 2019-08-04 NOTE — Unmapped (Signed)
Pediatric Transfer Center Request Note    Requesting (OSH) Physician: [facility]: [facility]    Requested Service: peds GI    Reason for Transfer: jaundice, elevated bilirubin, homelessness, noncompliance    ----------------------------------------------------------------------------------------------------------------------  Brief History/Clinical Course: [name] Gross is a 18 year old followed by Peds GI s/p liver transplant in 2017, been homeless for the last 5 months, noncompliant with medications (takes every 3-4 days) who is now jaundiced with ascites in belly. Hypotensive (SBP 80) on arrival, reportedly not eating or drinking well (nothing in last 3 days). Given 1L NS bolus via IV and some potassium supplementation. Did endorse blood in stool, but hemoccult negative.    Objective Data:  Weight: ?55-60kg? No weight in computer  Vitals: HR 84, RR 18, T 99.58F, sats 99%, BP 98/52  Current respiratory support: RA  Access: PIV    Labs: bilirubin 16.6, AST 572, ALT 338, alk phos 1046, ammonia 43, CBC: WBC 6, Hgb 12, Plt 185, PT 17.4, INR 1.4, PTT 35.9, albumin 2.3, K 2.8, rapid COVID pending, marijuana positive  Imaging: CT abdomen: trace ascites, peripheral adenopathy    ----------------------------------------------------------------------------------------------------------------------  Transport method: ground    Plan Upon Arrival: likely will need rejection protocol, will need to talk over with Dr. Melrose Nakayama; will also need discussion of risk factors while homeless (drug use, sexual history, etc), will need HIV screen, UDS, nutrition consult for malnutrition/concern of refeeding    Bed Type: 5CH, acute    Accepting Service: PMG, Dr. Lesia Sago, MD  Pediatric Hospital Medicine  Pager [phone]    August 03, 2019 11:27 PM

## 2019-08-04 NOTE — Unmapped (Signed)
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 Of note, he has a hx of homelessness x past ~5 months, difficulty with medication adherence and food access.     Anthropometrics:  Current Wt: 51.8 kg (114 lb 3.2 oz) 2 %ile (Z= -2.16) based on CDC (Boys, 2-20 Years) weight-for-age data using vitals from 08/04/2019.  Length or Height: 174.6 cm (5' 8.74) 38 %ile (Z= -0.30) based on CDC (Boys, 2-20 Years) Stature-for-age data based on Stature recorded on 08/04/2019.  Current BMI:  Body mass index is 16.99 kg/m??. <1 %ile (Z= -2.87) based on CDC (Boys, 2-20 Years) BMI-for-age based on BMI available as of 08/04/2019.     Admission Wt: 51.8 kg (114 lb 3.2 oz)  Ideal Body Wt: 69.2 kg (BMI/age at 50%ile)    Growth velocity/trends: -6.7 kg (11%) since 10/13/2018    Nutrition-Focused Physical Exam:    Nutrition Evaluation  Overall Impressions: Unable to perform Nutrition-Focused Physical Exam at this time due to (comment)(deferred until next visit) (08/04/19 1147)    Nutritionally Relevant Data:  Meds: Nutritionally-relevant medications reviewed. Medications include famotidine, methylprednisolone, vitamin K 10 mg x 1.   IV Fluids: D5NS at 100 mL/hr  Labs:  Reviewed 08/04/2019 labs. Noted Na 132 (L), K 3.3 (L), total bilirubin 15.5 (H), alk phos 1499 (H), PT 19.0 (H), Ca 7.7 (L), albumin 2.5    Emesis: none documented   Stools: none documented  UOP: none documented    Home Nutrition History:  Food Allergies/Intolerances: NKFA per chart review     PO Diet: Regular diet PO ad lib with hx of food insecurity and poor appetite    Current Medical Nutrition Therapy:  Enteral/Parenteral Access: PIV    Pt is currently NPO    Estimated Nutrient Needs:  Nutrition Calculation Weight: Current  Calculation Method: Catch up growth  Calculation Consideration : Catch up growth, Disease State    Estimated Energy Needs: (45-50 kcal/kg)  Total Protein Estimated Needs: 1.5-2 grams/kg  Total Fluid Estimated Needs: Per Holliday-Segar(41 mL/kg or per medical team)    Weight and Growth Goal:  Recommended body mass index (BMI): 25-50 %    Pediatric AND/ASPEN Malnutrition Screening:  Primary Indicator of Malnutrition  Weight loss (43-59 years of age): 10% of usual body weight, indicating SEVERE protein-calorie malnutrition    Chronicity: Chronic (>/equal to 3 months)    Etiology  Illness-related: Decreased nutrient intake, Increased nutrient requirement  Non-Illness related: Decreased nutrient intake due to limited resources, Decreased nutrient intake due to limited access to food    Overall Impression: Patient meets criteria for SEVERE protein-calorie malnutrition (08/04/19 1120)    Nutrition Assessment:  ?? Pt is not meeting established goals for growth. He meets criteria for severe malnutrition in the setting of poor intake, possibly compounded by decreased appetite in the setting of liver disease. Nutrition status may be worse than appears from BMI/age given ascites.   ?? Pt is at mild risk of refeeding syndrome given BMI/age z-score change >1, mild hypokalemia on admission, and hx of food insecurity x past several months. Micronutrient supplementation  and lyte monitoring indicated.   ?? Reviewed labs, noted elevated alk phos likely due to liver disease vs metabolic bone disease. Pt receiving vitamin K supplementation for elevated PT. Team planning on checking INR tonight.   ?? Pt is at increased risk of fat-soluble vitamin deficiencies. For now will plan to start regular adult multivitamin to provide more minerals due to risk of refeeding, and will determine need to transition to water-miscible formulation based on additional labs.   ?? Pt has increased Ca++ and vitamin D needs with steroid therapy.    Nutrition Interventions/Recommendations:   ?? Advance diet when medically feasible.   ?? Refeeding syndrome prophylaxis/monitoring:   ?? Daily adult MVI with minerals tablet  ?? Thiamine 100 mg/day x 3 days  ?? Daily BMP, mag, phos x 3 days. Replete lytes aggressively.   ?? Consider additional K+ repletion today and adjust dose PRN  ?? Additional micronutrient supplementation:  ?? Cholecalciferol 1000 units/day  ?? Calcium 300 mg BID  ?? Additional recommended nutrition labs: 25-OH vitamin D  ?? Daily weights.   ?? Consider trending abdominal girth.   ?? Consider social work consult.   ?? Service RD will continue to monitor hospital course and update nutrition POC as needed.     Loraine Maple, MPH, RD, LDN  Pager: [phone]

## 2019-08-04 NOTE — Unmapped (Signed)
[place] INTERVENTIONAL RADIOLOGY - General Biopsy Consultation Note      Requesting Attending Physician: Glorianne Manchester, *  Service Requesting Consult: Ped Gastroenterology [facility])    Date of Service: 08/04/2019  Consulting Interventional Radiologist: Dr. Melynda Ripple    Subjective:       Biopsy Site:  Transplant liver, non-target    HPI:  Mr. Duplantis is a 18 y.o. male with hx of OLT (10/17) c/b multiple episodes of rejection (last biopsy confirmed 06/30/18). Hx of primary sclerosing cholangitis. He presents this admission with multiple episodes black stools, abdominal pain and jaundice concerning for acute rejection. He has had dispositional problems and has not been taking his transplant medications for the last month. VIR consulted for repeat biopsy.    Objective:      Pertinent Imaging:  [place] 08/04/19, reviewed by Dr. Melynda Ripple    Pertinent Laboratory Values:  WBC   Date Value Ref Range Status   08/04/2019 5.9 4.5 - 11.0 10*9/L Final   02/11/2019 2.8 (L) 3.4 - 10.8 x10E3/uL Final     HGB   Date Value Ref Range Status   08/04/2019 12.0 (L) 13.5 - 17.5 g/dL Final   08/65/7846 96.2 13.0 - 17.7 g/dL Final     Hemoglobin   Date Value Ref Range Status   09/22/2016 9.6 (L) 13.5 - 17.5 g/dL Final     Comment:     Point of Care Testing performed at the point of care by trained personnel per documented policies.     HCT   Date Value Ref Range Status   08/04/2019 37.1 (L) 41.0 - 53.0 % Final   02/11/2019 40.4 37.5 - 51.0 % Final     Platelet   Date Value Ref Range Status   08/04/2019 195 150 - 440 10*9/L Final   02/11/2019 197 150 - 450 x10E3/uL Final     INR   Date Value Ref Range Status   08/04/2019 1.64  Final     Creatinine   Date Value Ref Range Status   08/04/2019 0.66 (L) 0.70 - 1.30 mg/dL Final   95/28/4132 4.40 (H) 0.76 - 1.27 mg/dL Final       Anticoaguation: NO  If yes, type of anticoagulation     Allergies:     Allergies   Allergen Reactions   ??? Bee Pollen Rash     Pt has seasonal allergies that cause excessive sneezing and running nose- [name], NAII   ??? Pollen Extracts Rash     Pt has seasonal allergies that cause excessive sneezing and running noseCardell Peach       Physical Exam:    Vitals:    08/04/19 0825   BP: 90/48   Pulse: 70   Resp: 16   Temp: 37.2 ??C (99 ??F)   SpO2: 100%       Assessment:     Mr. Russey is a 18 y.o. male with history of liver transplant and multiple episodes of rejection. He presents with concern for rejection, VIR consulted for repeat non-target liver biopsy.  Pertinent history, imaging and laboratory values in patient's medical record have been reviewed.     Plan/Recommendations:         - VIR recommends proceeding with biopsy of liver percutaneous with Ultrasound.  - Anticipated procedure date: 08/04/19  - Please make NPO night prior to procedure  - Please ensure recent CBC, Creatinine, and INR are available    Informed Consent:  This procedure has been fully reviewed  with the patient/patient???s authorized representative. The risks, benefits and alternatives have been explained, and the patient/patient???s authorized representative has consented to the procedure.  --The patient will accept blood products in an emergent situation.  --The patient does not have a Do Not Resuscitate order in effect.    The patient was discussed with Dr. Mittie Bodo, August 04, 2019, 12:26 PM

## 2019-08-04 NOTE — Unmapped (Addendum)
Care Management  Initial Transition Planning Assessment    CAT: SW met with patient in room. SW wore mask and face shield. SW waiting for confirmation as to whether patient will be covered by Transplant team for SW/CM or not. Patient was dancing in his room when SW entered. He was engaged in conversation with this Clinical research associate. Patient has significant MH Hx but at this time does not endorse any MH issues or SI.               General  Care Manager assessed the patient by : In person interview with patient, Medical record review  Orientation Level: Oriented X4  Who provides care at home?: N/A  Reason for referral: Discharge Planning, Psychosocial/Community Resource Concerns    Contact/Decision Maker  Patient wants all emergency contacts removed on file, keeping only his personal cell phone number and no emergency contacts.    Patient Information  Lives with: Other (Comment)(Currently Homeless)   Per patient, he was living with his grandmother, whose address he put on file today for mail/M'caid purposes. He left her home about a 2 weeks ago with intent to move to [place] but it did not work out. He is now back in [place] with no source of income, no family support, and no friends, and nowhere to live.     When asked about his plans, patient requested homeless shelter information.     Type of Residence: Homeless     Location/Detail: Patient requests his Grandmother's home be put on file for mail receipt. [address] in [place]    Support Systems: None    Responsibilities/Dependents at home?: No    Home Care services in place prior to admission?: No    Equipment Currently Used at Home: none    Currently receiving outpatient dialysis?: No    Financial Information    Need for financial assistance?: Yes(PAtient reports no source of income.)  Type of financial assistance required: [employer]    Social Determinants of Health  Social Determinants of Health were addressed in provider documentation.  Please refer to patient history.    Discharge Needs Assessment  Concerns to be Addressed: basic needs, discharge planning, financial/insurance, homelessness, mental health    Clinical Risk Factors: Principal Diagnosis: Cancer, Stroke, COPD, Heart Failure, AMI, Pneumonia, Joint Replacment    Barriers to taking medications: Yes (Comment)(Patient has not had money to eat and has been travelling with all his possessions in suitcases - homeless. Patient has not been taking meds due to moving around all the time and having no food.)    Prior overnight hospital stay or ED visit in last 90 days: No    Readmission Within the Last 30 Days: no previous admission in last 30 days    Anticipated Changes Related to Illness: (At this time, patient is unable to care for his basic needs.)    Equipment Needed After Discharge: none    Discharge Facility/Level of Care Needs: other (see comments)(TBD)    Readmission  Risk of Unplanned Readmission Score: UNPLANNED READMISSION SCORE: 10%  Predictive Model Details           10% (Low) Factors Contributing to Score   Calculated 08/04/2019 12:04 26% Number of active Rx orders is 22   [facility] Risk of Unplanned Readmission Model 16% Latest calcium is low (7.7 mg/dL)     57% Diagnosis of electrolyte disorder is present     11% Imaging order is present in last 6 months  10% Latest hemoglobin is low (12.0 g/dL)     8% Diagnosis of deficiency anemia is present     7% Active corticosteroid Rx order is present     4% Charlson Comorbidity Index is 2     Readmitted Within the Last 30 Days? (No if blank)   Patient at risk for readmission?: Yes    Discharge Plan  Screen findings are: Care Manager reviewed the plan of the patient's care with the Multidisciplinary Team. No discharge planning needs identified at this time. Care Manager will continue to manage plan and monitor patient's progress with the team.    Patient and/or family were provided with choice of facilities / services that are available and appropriate to meet post hospital care needs?: N/A     Initial Assessment complete?: Yes

## 2019-08-04 NOTE — Unmapped (Signed)
Pediatric History and Physical      Assessment/Plan:   Principal Problem:    Liver transplant rejection (CMS-HCC)  Active Problems:    Recurrent major depressive disorder, in partial remission (CMS-HCC)    Malnutrition of mild degree (CMS-HCC)    History of liver transplant (CMS-HCC)    Sickle cell trait (CMS-HCC)    Transaminitis    Anemia    Hypoalbuminemia    Cholangitis of transplanted liver (CMS-HCC)    Abdominal pain    Melena    Homeless    Dark stools  Resolved Problems:    * No resolved hospital problems. [name] Gross is a 18 y.o. male with history of primary sclerosing cholangitis s/p liver transplant in 2017 with multiple threats of rejection who now presents with black stools, abdominal pain, and icteric sclera most likely due to acute liver transplant rejection/failure. Given Zebedee' history of difficulty with liver transplant medication adherence due to social concerns and mood changes, hepatosplenomegaly on exam, mild tenderness to palpation in LUQ, transaminitis, hyperbilirubinemia, and icteric sclera his is likely experiencing acute vs subacute rejection of his transplanted liver. Other items on the differential include portal hypertensive gastropathy (typically less likely cause of bleeding compared to varices), gastroenteritis (unlikely given lack of fever and normal WBC), peptic ulcer (less likely given that pain localizes mostly to LLQ and LUQ), AVM, and hemobilia 2/2 liver tissue damage. Could also consider viral hepatitis (Hep A, B, C, EBV) but these are less likely without a fever or other signs of infection. Budd-Chiari is another possible cause of his transaminitis and hepatosplenomegaly, especially given his increased risk due to sickle cell trait, but is less likely at his age and compared to the more likely dx of acute rejection. [name] Gross also has a history of depression. He would likely benefit from inpatient psych consult, as his depressed mood is also likely contributing to medication non-adherence.     He requires care in the hospital for transplant rejection protocol treatment.    # Abdominal Pain, dark stools  -Repeat FOBT    # Hx Liver Transplant - Subacute/Acute Liver Failure:   -GI/transplant following, appreciate recs  -Solumedrol 40mg  IV qd  -Mycophenolate 540mg  bid  -Tacrolimus 5mg  bid   -possible liver biopsy  -AM labs: CMP, INR, CBC    #FEN/GI  -NPO for liver biopsy  -can resume normal diet after procedure    # Depression: Stable. Marland Kitchen He denies any SI, passive suicidality, self-harm, or HI at this time.  -Consider psych consult    # Homelessness - Social Concerns:   -[name] Gross has experienced homelessness for ~5 months and though he denies cost barriers to his medication, he often skips days and it is not clear whether it is indeed related to cost. He has not eaten in 2-4 days due to decreased appetite and cost concerns. Additionally, he is routinely exposed to alcohol and drugs (marijuana, cocaine, etc.) second-hand through his friendships though he denies current use himself.   -Nutrition Consult   -Consider social work/case management consult    Access: PIV    Discharge criteria: liver transplant work-up    History:   Primary Care Provider: [name], MD    History provided by: patient    An interpreter was not used during the visit.     I have personally reviewed outside and/or ED records.     Chief Complaint: dark stools    HPI:     [name] Gross is a  18 yo male with PMHx of PSC s/p liver transplant with acute rejection presenting with 1 day history of melena.     He first noted black stools without obvious blood this morning. He had two bowel movements that were black. He then called his transplant coordinator who told him to go to the hospital. Has endorses pain with bowel movements, rated as 5/10, that has been going on for ~1 week. He also reports night sweats (1 month), nausea (few days), diarrhea (3 wks), jaundiced conjunctiva (5 months). He states that he has not gone to the provider for these concerns because he does not like going to the hospital.    Regarding his mood, he reports feeling down, not wanting to do anything, and lack of energy more days than not. He states that other days he feels like he has pent-up anxiety that is overwhelming. Has previously tried therapy but never found one that he liked (tried fifteen therapists). He does not endorse any SI, self-harm, or HI.     Has been skipping meals due to low appetite as well as limited access to food. His last meal prior to arriving at the hospital was 2-4 days ago. He was able to eat 1 meal at the outside hospital. He reports that he does not have a stable place to stay. He had recently traveled to [place] to stay with an ex-partner, but there was a recent falling out/conflict and he was not sure where he was going to sleep tonight. He states that he has been homeless, transitioning between various friend's homes for ~5 months.     He reports taking his medications around 4 out of 7 days.     He denies fever, cough, SOB, constipation, LOC, abdominal swelling, or skin changes. He denies any alcohol, tobacco, vaping, or elicit substance use. States positive UDS at OSH [place] likely because he spends time with lots of people who smoke.     He initially presented to [facility]) yesterday evening (08/23). There, he had intermittent tachypnea (RR 25-35) with other VS wnl and stable. CBC had WBC 6.00, Hb 12.0 (L), Hct 32.5 (L), MCV 74.9 (L), MCHC 36.9 (H), and RDW 23.2 (H) consistent with microcytic anemia. Monocytes were elevated to 13.  CMP revealed hyponatremia (128), hypokalemia (2.8), hypocalcemia (7.5), transaminitis (AST 572, ALT 338, AlkPhos 1046), hyperbilirubinemia (16.6), and hypoalbuminemia (2.3). Ammonia was elevated to 43. Lactate was negative. PT was elevated to 17.4, INR wnl,  APTT wnl. Urinalysis had proteinuria (100 mg/dl) and hematuria (54 RBC). Hemoccult stool test was negative. UDS was positive for cannabis.   An EKG was done and was NSR with T wave flattening. A CT abdomen and pelvis revealed trace ascites, periportal adenopathy up to 14mm, and no evidence of acute changes. Negative covid test.     At OSH, he was provided bolus NS, 1L bolus NS, iohexol 35k IV, 10 meq KCl x3.       PAST MEDICAL HISTORY:   Past Medical History:   Diagnosis Date   ??? 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 Depression    ??? Seasonal allergies    ??? Sickle cell trait (CMS-HCC)        PAST SURGICAL HISTORY:  Past Surgical History:   Procedure Laterality Date   ??? FRENULECTOMY, LINGUAL     ???  PR ERCP REMOVE FOREIGN BODY/STENT BILIARY/PANC DUCT N/A 08/20/2017    Procedure: ENDOSCOPIC RETROGRADE CHOLANGIOPANCREATOGRAPHY (ERCP); W/ REMOVAL OF FOREIGN BODY/STENT FROM BILIARY/PANCREATIC DUCT(S);  Surgeon: Vonda Antigua, MD;  Location: GI PROCEDURES MEMORIAL [facility];  Service: Gastroenterology   ??? PR ERCP REMOVE FOREIGN BODY/STENT BILIARY/PANC DUCT N/A 06/04/2018    Procedure: ENDOSCOPIC RETROGRADE CHOLANGIOPANCREATOGRAPHY (ERCP); W/ REMOVAL OF FOREIGN BODY/STENT FROM BILIARY/PANCREATIC DUCT(S);  Surgeon: Chriss Driver, MD;  Location: GI PROCEDURES MEMOR[facility];  Service: Gastroenterology   ??? PR ERCP STENT PLACEMENT BILIARY/PANCREATIC DUCT N/A 11/21/2016    Procedure: ENDOSCOPIC RETROGRADE CHOLANGIOPANCREATOGRAPHY (ERCP); WITH PLACEMENT OF ENDOSCOPIC STENT INTO BILIARY OR PANCREATIC DUCT;  Surgeon: Vonda Antigua, MD;  Location: GI PROCEDURES MEMOR[facility];  Service: Gastroenterology   ??? PR ERCP,W/REMOVAL STONE,BIL/PANCR DUCTS N/A 08/20/2017    Procedure: ERCP; W/ENDOSCOPIC RETROGRADE REMOVAL OF CALCULUS/CALCULI FROM BILIARY &/OR PANCREATIC DUCTS;  Surgeon: Vonda Antigua, MD;  Location: GI PROCEDURES MEMORIAL [facility];  Service: Gastroenterology   ??? PR ERCP,W/REMOVAL STONE,BIL/PANCR DUCTS N/A 06/04/2018    Procedure: ERCP; W/ENDOSCOPIC RETROGRADE REMOVAL OF CALCULUS/CALCULI FROM BILIARY &/OR PANCREATIC DUCTS;  Surgeon: Chriss Driver, MD;  Location: GI PROCEDURES [facility]OR[facility];  Service: Gastroenterology   ??? PR TRANSPLANT LIVER,ALLOTRANSPLANT N/A 09/22/2016    Procedure: LIVER ALLOTRANSPLANTATION; ORTHOTOPIC, PARTIAL OR WHOLE, FROM CADAVER OR LIVING DONOR, ANY AGE;  Surgeon: Doyce Loose, MD;  Location: MAIN OR [facility];  Service: Transplant   ??? PR TRANSPLANT,PREP DONOR LIVER, WHOLE N/A 09/22/2016    Procedure: [facility] STD PREP CAD DONOR WHOLE LIVER GFT PRIOR TNSPLNT,INC CHOLE,DISS/REM SURR TISSU WO TRISEG/LOBE SPLT;  Surgeon: Doyce Loose, MD;  Location: MAIN OR [facility];  Service: Transplant   ??? PR UPGI ENDOSCOPY W/US FN BX N/A 08/20/2017    Procedure: UGI W/ TRANSENDOSCOPIC ULTRASOUND GUIDED INTRAMURAL/TRANSMURAL FINE NEEDLE ASPIRATION/BIOPSY(S), ESOPHAGUS;  Surgeon: Vonda Antigua, MD;  Location: GI PROCEDURES MEMOR[facility];  Service: Gastroenterology   ??? PR UPPER GI ENDOSCOPY,BIOPSY N/A 07/15/2016    Procedure: UGI ENDOSCOPY; WITH BIOPSY, SINGLE OR MULTIPLE;  Surgeon: Arnold Long Mir, MD;  Location: PEDS PROCEDURE ROOM [facility];  Service: Gastroenterology   ??? PR UPPER GI ENDOSCOPY,CTRL BLEED Left 05/05/2016    Procedure: UGI ENDOSCOPY; WITH CONTROL OF BLEEDING, ANY METHOD;  Surgeon: Arnold Long Mir, MD;  Location: PEDS PROCEDURE ROOM [facility];  Service: Gastroenterology   ??? PR UPPER GI ENDOSCOPY,CTRL BLEED N/A 05/12/2016    Procedure: UGI ENDOSCOPY; WITH CONTROL OF BLEEDING, ANY METHOD;  Surgeon: Annie Paras, MD;  Location: CHILDRENS OR [facility];  Service: Gastroenterology       FAMILY HISTORY:  Family History   Problem Relation Age of Onset   ??? Diabetes Maternal Grandmother    ??? Diabetes Paternal Grandmother    ??? Hypertension Maternal Grandfather    ??? Hypertension Paternal Grandfather    ??? Asthma Brother    ??? Anesthesia problems Neg Hx        SOCIAL HISTORY:  Homeless.  Denies smoking marijuana, says he's around people who do. Denies other recreational drugs. Not currently sexually active. Previous male sexual partner. Denies alcohol use, reports accidentally drinking tequila once at a party recently. Denies tobacco and vaping use.     ALLERGIES:  Bee pollen and Pollen extracts     MEDICATIONS:  Current Facility-Administered Medications   Medication Dose Route Frequency Provider Last Rate Last Dose   ??? dextrose 5 % and sodium chloride 0.45 % infusion  100 mL/hr Intravenous Continuous David Stall, MD 100 mL/hr at  08/04/19 0500 100 mL/hr at 08/04/19 0500   ??? famotidine (PEPCID) tablet 20 mg  20 mg Oral BID David Stall, MD       ??? methylPREDNISolone sodium succinate (PF) (Solu-MEDROL) injection 40 mg  40 mg Intravenous Daily David Stall, MD   40 mg at 08/04/19 0535   ??? mycophenolate (MYFORTIC) EC tablet 540 mg  540 mg Oral BID David Stall, MD       ??? tacrolimus (PROGRAF) capsule 5 mg  5 mg Oral BID David Stall, MD           IMMUNIZATIONS: up to date and documented    ROS:  The remainder of 10 systems reviewed were negative except as mentioned in the HPI.       Physical:   Vital signs: BP 93/64  - Pulse 88  - Temp 37.1 ??C (Axillary)  - Resp 18  - Ht 174.6 cm (5' 8.74)  - Wt 51.8 kg (114 lb 3.2 oz)  - SpO2 100%  - BMI 16.99 kg/m??   Vitals:    08/04/19 0345   Weight: 51.8 kg (114 lb 3.2 oz)   , 2 %ile (Z= -2.16) based on CDC (Boys, 2-20 Years) weight-for-age data using vitals from 08/04/2019.   Ht Readings from Last 1 Encounters:   08/04/19 174.6 cm (5' 8.74) (38 %, Z= -0.30)*     * Growth percentiles are based on CDC (Boys, 2-20 Years) data.   , 38 %ile (Z= -0.30) based on CDC (Boys, 2-20 Years) Stature-for-age data based on Stature recorded on 08/04/2019.  HC Readings from Last 1 Encounters:   No data found for [facility]    No head circumference on file for this encounter.  Body mass index is 16.99 kg/m??., <1 %ile (Z= -2.87) based on CDC (Boys, 2-20 Years) BMI-for-age based on BMI available as of 08/04/2019.  General:   alert, active, in no acute distress, affect was  down  Head:  atraumatic and normocephalic  Eyes:   pupils equal, round, reactive to light and sclera icteric   Oropharynx:   moist mucous membranes without erythema, exudates or petechiae  Lungs:   clear to auscultation, no wheezing, crackles or rhonchi, breathing unlabored  Heart:   Normal PMI. regular rate and rhythm, normal S1, S2, no murmurs or gallops.  Abdomen:   soft, umbilicus normal, symmetric, spleen non-palpable, scar from prior liver transplant present, hepatomegaly, liver edge palpable 2 below costal margin  Extremities:   moves all extremities equally, capillary refill:  fair , no swelling  Rectal:  not examined  Skin:   warm, no rashes, no ecchymosis and jaundice (noticeable at fingertips)    Labs/Studies:  Labs and Studies from the last 24hrs per EMR and Reviewed and All lab results last 24 hours:    Recent Results (from the past 24 hour(s))   Comprehensive Metabolic Panel    Collection Time: 08/04/19  4:56 AM   Result Value Ref Range    Sodium 132 (L) 135 - 145 mmol/L    Potassium 3.3 (L) 3.5 - 5.0 mmol/L    Chloride 104 98 - 107 mmol/L    Anion Gap 5 (L) 7 - 15 mmol/L    CO2 23.0 22.0 - 30.0 mmol/L    BUN 8 7 - 21 mg/dL    Creatinine 1.61 (L) 0.70 - 1.30 mg/dL    BUN/Creatinine Ratio 12     EGFR CKD-EPI Non-African American, Male >90 >=60 mL/min/1.20m2    EGFR  CKD-EPI African American, Male >90 >=60 mL/min/1.53m2    Glucose 78 70 - 179 mg/dL    Calcium 7.7 (L) 8.5 - 10.2 mg/dL    Albumin 2.5 (L) 3.5 - 5.0 g/dL    Total Protein 9.8 (H) 6.5 - 8.3 g/dL    Total Bilirubin 08.6 (H) 0.0 - 1.2 mg/dL    AST 578 (H) 19 - 55 U/L    ALT 424 (H) <50 U/L    Alkaline Phosphatase 1,499 (H) 65 - 260 U/L   PT-INR    Collection Time: 08/04/19  4:56 AM   Result Value Ref Range PT 19.0 (H) 10.2 - 13.1 sec    INR 1.64    CBC w/ Differential    Collection Time: 08/04/19  4:56 AM   Result Value Ref Range    WBC 5.9 4.5 - 11.0 10*9/L    RBC 4.67 4.50 - 5.90 10*12/L    HGB 12.0 (L) 13.5 - 17.5 g/dL    HCT 46.9 (L) 62.9 - 53.0 %    MCV 79.4 (L) 80.0 - 100.0 fL    MCH 25.7 (L) 26.0 - 34.0 pg    MCHC 32.4 31.0 - 37.0 g/dL    RDW 52.8 (H) 41.3 - 15.0 %    MPV 8.1 7.0 - 10.0 fL    Platelet 195 150 - 440 10*9/L    Neutrophils % 43.0 %    Lymphocytes % 44.3 %    Monocytes % 7.4 %    Eosinophils % 2.6 %    Basophils % 0.4 %    Absolute Neutrophils 2.5 2.0 - 7.5 10*9/L    Absolute Lymphocytes 2.6 1.5 - 5.0 10*9/L    Absolute Monocytes 0.4 0.2 - 0.8 10*9/L    Absolute Eosinophils 0.2 0.0 - 0.4 10*9/L    Absolute Basophils 0.0 0.0 - 0.1 10*9/L    Large Unstained Cells 2 0 - 4 %    Microcytosis Marked (A) Not Present    Anisocytosis Moderate (A) Not Present         Herbie Saxon, MS3    I attest that I have reviewed the student note and that the components of the history of the present illness, the physical exam, and the assessment and plan documented were performed by me or were performed in my presence by the student where I verified the documentation and performed (or re-performed) the exam and medical decision making.     Gerrie Nordmann, MD   Pediatrics, PGY-1  Pager Number [phone]

## 2019-08-04 NOTE — Unmapped (Addendum)
Social Work  Psychosocial Assessment    PSA: SW met with patient at the bedside. He was engaged in conversation with SW and was pleasant. Conversation cut short as patient needed to be transported to ultrasound. SW wore mask and face shield. Patient was not masked.     Patient Name: [name] Gross   Medical Record Number: [id number]   Date of Birth: [birth year]  Sex: Male     Referral  Referred by: Nurse  Reason for Referral: Homeless Patient / Family    PATIENT WANTS ALL THE FOLLOWING EMERGENCY CONTACTS REMOVED FROM Epic. He wants only his phone number to remain in chart. Due to patient's MH issues, and current health issues, SW is following patient's request but is saving info in this note from chart at this time in case patient changes mind during admission.    Extended Emergency Contact Information  Mother: Diggs,[name]  Address: [address]           [place], [place] [address] [place]  Mobile Phone: [phone]    GrandparentIrene Shipper  Mobile Phone: [phone]    Father: Hetty Ely III  Home Phone: [phone]  Mobile Phone: [phone]    Discharge Planning  Discharge Planning Information: TBD    Type of Residence   Homeless: Homeless shelter list being provided.    Financial Information   Primary Insurance: Payor: MEDICAID [place] / Plan: MEDICAID [place] ACCESS / Product Type: *No Product type* /    Secondary Insurance: Secondary Insurance  MEDICAID [facility]*   Prescription Coverage: Medicaid   Preferred Pharmacy: [id number] - [place], [place] - [address] AT [facility] OF [address]  [facility] - [place] - [address]  [facility] - [place] - [address]  [facility] [phone] - [place], [place] - [address] AT [facility] OF [address]  [place] [facility]    Barriers to taking medication: Yes - Financial barriers, transportation barriers, food insecurity     Transition Home   Transportation at time of discharge: Other: Patient has no form of transportation    Anticipated changes related to Illness: inability to care for self   Services in place prior to admission: [facility]: Unknown when this service lapsed.   Services anticipated for DC:     Hemodialysis Prior to Admission: No    Readmission  Risk of Unplanned Readmission Score: UNPLANNED READMISSION SCORE: 10%  Readmitted Within the Last 30 Days?   Readmission Factors include: lack of support    Social Determinants of Health  Social Determinants of Health were addressed in provider documentation.  Please refer to patient history. Patient is food insecure and homeless.    Social History  Support Systems: None  Patient would not go into detail about social history except to say that he had severed all ties with his family and wished all contact information needed to be removed from Epic except for his phone number. Patient reports that he has no options for returning to the home of any family members nor receiving any support. Patient states he was living with his MGM in [place] prior to moving to [place] where he lived with friends for a couple of weeks. Patient did not have money for food and was unable to secure medications. He was transferred to [facility] from OSH in [place]. PAtient  reports travelling on busses and generally on the move for a while. SW placed MGM's address on file at patient's request so he can get his mail.     At this time, patient has no plans except to get set up in a homeless shelter and look for a job.  Medical and Psychiatric History     Psychological Issues/Information: Comment(Patient has significant MH Hx in Epic. Per patient, he claims to have no SI or MH needs at this time.)       Comment: Hx of depression and SI per chart review, however patient does not endorse these issues currently.  Chemical Dependency: None      Legal: No legal issues Ability to [employer]: Unfamiliar with options/procedures for obtaining      Patient has significant psych history and [facility] psych consult was placed by team.    Interventions:  Homeless shelter lists provided to patient.

## 2019-08-04 NOTE — Unmapped (Signed)
[facility]  Initial Psychiatry Consult Note     Service Date: August 04, 2019  LOS:  LOS: 0 days      Assessment:   [name] Gross is a 18 y.o. male with pertinent past medical and psychiatric diagnoses of primary sclerosing cholangitis s/p liver transplant in 2017 with multiple threats of rejection, and depression, anxiety, and suicide attempt (x2) admitted 08/04/2019  3:39 AM for black stools, abdominal pain, and icteric sclera most likely due to acute liver transplant rejection/failure.  Patient was seen in consultation by Psychiatry at the request of Glorianne Manchester, * with [facility]) for evaluation of Depression.     The patient's current presentation of depressed mood, poor sleep, poor appetite, feelings of isolation, and nightmares is most consistent with a known diagnosis of majory depressive disorder and likely exacerbated by current primary medical illness. Patient has a history of intentional overdose on fluoxetine in the past, then more recently on tamiflu, prednisone, ibuprofen, and vitamin D. He was hospitalized on the Adolescent Psychiatry unit at this center for the latter attempt, and discharged 03/03/18 on Lexapro 20 mg, melatonin 9 mg for depression and insomnia. He reports mental health care has been unhelpful in the past and he is not interested in medication for depression or therapy- he has seen Psychology previously as inpatient. He has not engaged with mental health resources or taken medication for depression or insomnia in 8-10 months. He denies suicidal ideation or worsening depression. Recommend melatonin for sleep regulation, to which patient is amenable. Will continue to work with patient this admission to build rapport and insight, given psychiatric history, difficult social situation, lack of social support, and serious illness. Patient would likely benefit from therapy and will continue to offer resources and provide therapeutic validation and support. Please see below for detailed recommendations.    Diagnoses:   Active Hospital problems:  Principal Problem:    Liver transplant rejection (CMS-HCC)  Active Problems:    Recurrent major depressive disorder, in partial remission (CMS-HCC)    Malnutrition of mild degree (CMS-HCC)    History of liver transplant (CMS-HCC)    Sickle cell trait (CMS-HCC)    Transaminitis    Anemia    Hypoalbuminemia    Cholangitis of transplanted liver (CMS-HCC)    Abdominal pain    Melena    Homeless    Dark stools     Problems edited/added by me:  No problems updated.    Safety Risk Assessment:  A suicide and violence risk assessment was performed as part of this evaluation. Risk factors for self-harm/suicide: previous suicide attempt(s), lack of social support, sense of isolation, current diagnosis of depression, history of depression, poor adherence to treatment , male age 35-35 and chronic severe medical condition.  Protective factors against self-harm/suicide:  lack of active SI, no known access to weapons or firearms, no previous suicide attempts in the last 6 months, has access to clinical interventions and support and supportive family.  Risk factors for harm to others: N/A.  Protective factors against harm to others: no known history of violence towards others, no known homicidal ideation in the last 6 months, no commands hallucinations to harm others in the last 6 months, no active symptoms of psychosis, no active symptoms of mania, no previous acts of violence in current setting and positive social orientation.  While future psychiatric events cannot be accurately predicted, the patient is not currently at elevated acute risk, and is at elevated chronic risk of  harm to self and is not currently at elevated acute risk, and is not at elevated chronic risk of harm to others.       Recommendations:   ## Safety:   -- No acute safey concerns.  Please see safety assessment for further discussion.  -- If pt attempts to leave against medical advice and it is felt to be unsafe for them to leave, please call a Behavioral Response and page Psychiatry at [phone].    ## Medications:   -- Defer initiation of SSRI or other antidepressant as patient is not interested today, will continue to assess  -- START Melatonin 3 mg q1800 for sleep and circadian rhythm regulation.    ## Medical Decision Making Capacity:   -- A formal capacity assessement was not performed as a part of this evaluation.  If specific capacity questions arise, please contact our team as below.     ## Further Work-up:   -- No recommendations at this time.    ## Disposition:   -- There are no psychiatric contraindications to discharging this patient when medically appropriate.  -- We will continue to encourage patient to consider outpatient mental health care    ## Behavioral / Environmental:   -- No specific recommendations at this time.    Thank you for this consult request. Recommendations have been communicated to the primary team.  We will follow as needed at this time. Please page [phone] or [phone] (after hours)  for any questions or concerns.     I saw the patient in person or via face-to-face video conference.    Discussed with and seen by Attending, Breck Coons, MD, who agrees with the assessment and plan.    Nichola Sizer, MD      I saw and evaluated the patient, participating in the key portions of the service.  I reviewed the resident???s note.  I agree with the resident???s findings and plan.     Cloria Spring, MD        History:   Relevant Aspects of Hospital Course:   Admitted on 08/04/2019 for c/f acute liver transplant rejection. Patient had family removed from emergency contacts, told SW their relationship cannot be repaired. Long hospitalization anticipated, pt requested shelter information for housing options. Medication non-adherence may play a role in current presentation, social concerns affecting medication access and mood changes are concerns. Patient Report: The patient was seen in person by the resident.  Patient reports he feels content. Came to hospital for dark stools, on his own. Validated patient making this choice and coming to care. Was staying in [place] with a friend, has been getting rides from kind strangers, didn't realize how unreliable these friends would be. They couldn't cover rent, water, power. Had trouble accessing food outside the hospital, and at times had poor appetite due he believes to illness. Was studying at [employer] near his family's home, wants to find a place to stay and work.     Hasn't been in mental health care in 8-10 months. Not interested in therapy or medication today. Does not report benefit when recalling interactions with psychiatry and psychology at this center in the past. Writes stories on his computer and finds this helpful. Recalls taking Remeron and Lexapro, did not find them helpful. Describes wanting to be independent adult, felt like he was caged by family expectations, couldn't do anything that he wanted as a kid. He describes dad and paternal grandmother as negative, not caring, trying  to get access to him, manipulative or with an agenda. Feels maybe mom cares about him, sister and cousin care genuinely about him. These two relate to his feeling pressure as an only child, reports he always has to be the most mature. Mom's oldest son is irresponsible and difficult, part of the reason he left home. Does not wish to discuss family further. Not in contact now. Not ok to call them.     Open to Melatonin for sleep. Denies SI/HI, denies AH/VH here, in the past saw shadows in the context of nightmares, before his transplant surgery while in the ICU and in hypovolemic shock. Now nightmares r/t falling off buildings, giant spiders, death by MVA, sometimes watching himself from a distance.     No tobacco, no cannabis reports 2nd hand smoke from others using around him, no other substance use or recent misuse of prescription medications. Addresses prior intentional overdoses, states he is not currently misusing prescribed meds, which he has with him in his luggage- multiple suitcases at bedside.     ROS:   All systems reviewed as negative/unremarkable aside from the following pertinent positives and negatives: no acute pain or discomfort, some nausea leading to reduced appetite.    Collateral information:   - Reviewed medical records in Epic  - Patient prefers that team not contact family at this time.    Psychiatric History:   Prior psychiatric diagnoses: anxiety, depression  Psychiatric hospitalizations:  Adolescent Unit 2019  Suicide attempts / Non-suicidal self-injury: two previous, by intentional overdose  Medication trials/compliance: Lexapro, Remeron, melatonin, Prozac  Most recent regimen: Lexapro 20 mg daily, melatonin 9 mg nightly  Family history unknown    Medical History:  Past Medical History:   Diagnosis Date   ??? 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 Depression    ??? Seasonal allergies    ??? Sickle cell trait (CMS-HCC)        Surgical History:  Past Surgical History:   Procedure Laterality Date   ??? FRENULECTOMY, LINGUAL     ??? PR ERCP REMOVE FOREIGN BODY/STENT BILIARY/PANC DUCT N/A 08/20/2017    Procedure: ENDOSCOPIC RETROGRADE CHOLANGIOPANCREATOGRAPHY (ERCP); W/ REMOVAL OF FOREIGN BODY/STENT FROM BILIARY/PANCREATIC DUCT(S);  Surgeon: Vonda Antigua, MD;  Location: GI PROCEDURES MEMORIAL [facility];  Service: Gastroenterology   ??? PR ERCP REMOVE FOREIGN BODY/STENT BILIARY/PANC DUCT N/A 06/04/2018    Procedure: ENDOSCOPIC RETROGRADE CHOLANGIOPANCREATOGRAPHY (ERCP); W/ REMOVAL OF FOREIGN BODY/STENT FROM BILIARY/PANCREATIC DUCT(S);  Surgeon: Chriss Driver, MD;  Location: GI PROCEDURES MEMORIAL [facility];  Service: Gastroenterology   ??? PR ERCP STENT PLACEMENT BILIARY/PANCREATIC DUCT N/A 11/21/2016    Procedure: ENDOSCOPIC RETROGRADE CHOLANGIOPANCREATOGRAPHY (ERCP); WITH PLACEMENT OF ENDOSCOPIC STENT INTO BILIARY OR PANCREATIC DUCT;  Surgeon: Vonda Antigua, MD;  Location: GI PROCEDURES MEMORIAL [facility];  Service: Gastroenterology   ??? PR ERCP,W/REMOVAL STONE,BIL/PANCR DUCTS N/A 08/20/2017    Procedure: ERCP; W/ENDOSCOPIC RETROGRADE REMOVAL OF CALCULUS/CALCULI FROM BILIARY &/OR PANCREATIC DUCTS;  Surgeon: Vonda Antigua, MD;  Location: GI PROCEDURES [facility];  Service: Gastroenterology   ??? PR ERCP,W/REMOVAL STONE,BIL/PANCR DUCTS N/A 06/04/2018    Procedure: ERCP; W/ENDOSCOPIC RETROGRADE REMOVAL OF CALCULUS/CALCULI FROM BILIARY &/OR PANCREATIC DUCTS;  Surgeon: Chriss Driver, MD;  Location: GI PROCEDURES [facility];  Service: Gastroenterology   ??? PR TRANSPLANT LIVER,ALLOTRANSPLANT N/A 09/22/2016    Procedure: LIVER ALLOTRANSPLANTATION; ORTHOTOPIC,  PARTIAL OR WHOLE, FROM CADAVER OR LIVING DONOR, ANY AGE;  Surgeon: Doyce Loose, MD;  Location: MAIN OR [facility];  Service: Transplant   ??? PR TRANSPLANT,PREP DONOR LIVER, WHOLE N/A 09/22/2016    Procedure: [facility] STD PREP CAD DONOR WHOLE LIVER GFT PRIOR TNSPLNT,INC CHOLE,DISS/REM SURR TISSU WO TRISEG/LOBE SPLT;  Surgeon: Doyce Loose, MD;  Location: MAIN OR [facility];  Service: Transplant   ??? PR UPGI ENDOSCOPY W/US FN BX N/A 08/20/2017    Procedure: UGI W/ TRANSENDOSCOPIC ULTRASOUND GUIDED INTRAMURAL/TRANSMURAL FINE NEEDLE ASPIRATION/BIOPSY(S), ESOPHAGUS;  Surgeon: Vonda Antigua, MD;  Location: GI PROCEDURES [facility];  Service: Gastroenterology   ??? PR UPPER GI ENDOSCOPY,BIOPSY N/A 07/15/2016    Procedure: UGI ENDOSCOPY; WITH BIOPSY, SINGLE OR MULTIPLE;  Surgeon: Arnold Long Mir, MD;  Location: PEDS PROCEDURE ROOM [facility];  Service: Gastroenterology   ??? PR UPPER GI ENDOSCOPY,CTRL BLEED Left 05/05/2016    Procedure: UGI ENDOSCOPY; WITH CONTROL OF BLEEDING, ANY METHOD;  Surgeon: Arnold Long Mir, MD;  Location: PEDS PROCEDURE ROOM [facility];  Service: Gastroenterology   ??? PR UPPER GI ENDOSCOPY,CTRL BLEED N/A 05/12/2016    Procedure: UGI ENDOSCOPY; WITH CONTROL OF BLEEDING, ANY METHOD;  Surgeon: Annie Paras, MD;  Location: CHILDRENS OR [facility];  Service: Gastroenterology       Medications:     Current Facility-Administered Medications:   ???  calcium carbonate (OS-CAL) tablet 300 mg elem calcium, 300 mg elem calcium, Oral, BID, Glorianne Manchester, MD, 300 mg elem calcium at 08/04/19 1232  ???  cholecalciferol (vitamin D3) tablet 1,000 Units, 1,000 Units, Oral, Daily, Glorianne Manchester, MD, 1,000 Units at 08/04/19 1225  ???  dextrose 5 % and sodium chloride 0.9 % infusion, 100 mL/hr, Intravenous, Continuous, Ejiofor A Lucky Cowboy., MD, Last Rate: 100 mL/hr at 08/04/19 0854, 100 mL/hr at 08/04/19 0854  ???  famotidine (PEPCID) tablet 20 mg, 20 mg, Oral, BID, David Stall, MD, 20 mg at 08/04/19 1610  ???  influenza vaccine quad (FLUARIX, FLULAVAL, FLUZONE) (6 MOS & UP) 2020-21, 0.5 mL, Intramuscular, During hospitalization, Glorianne Manchester, MD  ???  lidocaine (LMX) 4 % cream, , , ,   ???  methylPREDNISolone sodium succinate (PF) (Solu-MEDROL) injection 40 mg, 40 mg, Intravenous, Daily, David Stall, MD, 40 mg at 08/04/19 0535  ???  multivitamins, therapeutic with minerals tablet 1 tablet, 1 tablet, Oral, Daily, Glorianne Manchester, MD, 1 tablet at 08/04/19 1225  ???  mycophenolate (MYFORTIC) EC tablet 540 mg, 540 mg, Oral, BID, David Stall, MD, 540 mg at 08/04/19 0943  ???  tacrolimus (PROGRAF) capsule 5 mg, 5 mg, Oral, BID, David Stall, MD, 5 mg at 08/04/19 9604  ???  thiamine (B-1) tablet 100 mg, 100 mg, Oral, Daily, Glorianne Manchester, MD, 100 mg at 08/04/19 1226    Allergies:  Allergies   Allergen Reactions   ??? Bee Pollen Rash     Pt has seasonal allergies that cause excessive sneezing and running nose- [name], NAII   ??? Pollen Extracts Rash     Pt has seasonal allergies that cause excessive sneezing and running nose- [name]       Social History:   Living situation: the patient is homeless.  Guardian/Payee: None  Relationship Status: Single   Education: Some community college    Tobacco use: Denies historical use.  Alcohol use: Denies current use.  Drug use: Denies use of marijuana, heroin, cocaine or recent misuse of prescription drugs.    Family History:  Reviewed and updated  The patient's family history includes Asthma in his brother; Diabetes in his maternal grandmother and paternal grandmother; Hypertension in his maternal grandfather and paternal grandfather..      Objective:   Vital signs:   Temp:  [37.1 ??C-37.2 ??C] 37.2 ??C  Heart Rate:  [70-88] 70  Resp:  [16-18] 16  BP: (90-93)/(48-64) 90/48  MAP (mmHg):  [62-74] 62  SpO2:  [100 %] 100 %  BMI (Calculated):  [16.99] 16.99    Physical Exam:  Gen: No acute distress.  Pulm: Normal work of breathing.  Neuro/MSK: Reduced muscle bulk and tone, thin appearing, gait deferred.  Skin: not assessed.   HEENT: sleral icterus noted    Mental Status Exam:  Appearance:  appears stated age, well-developed, unkept, lying in bed, ill-appearing and poorly nourished   Attitude:   calm, cooperative and polite   Behavior/Psychomotor:  appropriate eye contact and no abnormal movements   Speech/Language:   normal rate, volume, tone, fluency and language intact, well formed   Mood:  ???content???   Affect:  full, mood congruent and generally euthymic, laughing at times   Thought process:  logical, linear, clear, coherent, goal directed   Thought content:    denies thoughts of self-harm. Denies SI, plans, or intent. Denies HI.  No grandiose, self-referential, persecutory, or paranoid delusions noted.   Perceptual disturbances:   denies auditory and visual hallucinations and behavior not concerning for response to internal stimuli   Attention:  able to fully attend without fluctuations in consciousness   Concentration:  Able to fully concentrate and attend   Orientation:  Oriented to person, place, city, day, month, year and situation.   Memory:  not formally tested, but grossly intact   Fund of knowledge:   not formally assessed   Insight:    Impaired   Judgment:   Impaired   Impulse Control:  Fair     Data Reviewed:  I reviewed labs from the last 24 hours.  I obtained history from the collateral sources noted above in the history.    Additional Psychometric Testing:  Not applicable.

## 2019-08-05 LAB — PHOSPHORUS: Phosphate:MCnc:Pt:Ser/Plas:Qn:: 3

## 2019-08-05 LAB — CMV QUANT LOG10: Lab: 0

## 2019-08-05 LAB — COMPREHENSIVE METABOLIC PANEL
ALBUMIN: 2 g/dL — ABNORMAL LOW (ref 3.5–5.0)
ALKALINE PHOSPHATASE: 1192 U/L — ABNORMAL HIGH (ref 65–260)
ALT (SGPT): 319 U/L — ABNORMAL HIGH (ref <50)
ANION GAP: 3 mmol/L — ABNORMAL LOW (ref 7–15)
AST (SGOT): 450 U/L — ABNORMAL HIGH (ref 19–55)
BILIRUBIN TOTAL: 14 mg/dL — ABNORMAL HIGH (ref 0.0–1.2)
BUN / CREAT RATIO: 17
CALCIUM: 7.4 mg/dL — ABNORMAL LOW (ref 8.5–10.2)
CHLORIDE: 111 mmol/L — ABNORMAL HIGH (ref 98–107)
CO2: 25 mmol/L (ref 22.0–30.0)
CREATININE: 0.84 mg/dL (ref 0.70–1.30)
EGFR CKD-EPI NON-AA MALE: >90 mL/min/{1.73_m2} (ref >=60)
GLUCOSE RANDOM: 116 mg/dL (ref 70–179)
POTASSIUM: 4.2 mmol/L (ref 3.5–5.0)
PROTEIN TOTAL: 8 g/dL (ref 6.5–8.3)
SODIUM: 139 mmol/L (ref 135–145)

## 2019-08-05 LAB — MAGNESIUM: Magnesium:MCnc:Pt:Ser/Plas:Qn:: 2.3 — ABNORMAL HIGH

## 2019-08-05 LAB — POTASSIUM: Potassium:SCnc:Pt:Ser/Plas:Qn:: 4.2

## 2019-08-05 LAB — GAMMA GLUTAMYL TRANSFERASE: Gamma glutamyl transferase:CCnc:Pt:Ser/Plas:Qn:: 88

## 2019-08-05 LAB — CMV DNA, QUANTITATIVE, PCR

## 2019-08-05 LAB — PROTIME: Coagulation tissue factor induced:Time:Pt:PPP:Qn:Coag: 16.9 — ABNORMAL HIGH

## 2019-08-05 NOTE — Unmapped (Addendum)
[name] Gross is a 18 y.o. 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     # Hx Liver Transplant - Subacute/Acute Liver Failure:   Patient had not been taking his medications regularly since leaving home over a month prior to admission. On admission, he was started on solumedrol, mycophenolate, and tacrolimus. He was also given IV Vit K.  INR was 1.6 on admission. Liver Transplant US showed patent vasculature, liver heterogeneous in echotexture with periportal edema, which is a nonspecific finding that can be seen in hepatitis or rejection. Small volume ascites was also present. Biopsy was performed on 9/25 and showed plasma cell-rich hepatitis/severe acute cellular rejection with periportal, perivenular, and focal confluent hepatocyte necrosis.Tacrolimus was adjusted throughout hospital stay to remain within therapeutic range (8-10). Liver labs were not improving, so on 10/1 he was starting on Thymoglobulin w/ Methylprednisolone, he remained on this regimen from 10/1 to 10/12. Thymoglobulin was held starting 10/13 in light of his strep mitis bacteremia (see ID section). He was placed on Valganciclovir, Bactrim, and Nystatin for infection prophylaxis. EBV, HIV, CMV negative. MRCP(10/9) showed central hepatic bile ducts slightly more prominent than previous w/out obvious stenosis or stricture; heterogeneous enhancement of liver, porta hepatis lymph nodes, large volume ascites. He was treated with a 3 day course of vitamin K (IV) 10/12-10/14 given his elevated INR. He was then continued on daily IV vitamin K until 10/25. He was treated with Rifaximin for a moderately high ammonia level in the setting of his liver dysfunction, however he did not have encephalopathy. On 10/13 his steroid dose was tapered given his CLABSI infection, he was weaned to 60mg  PO prednisone (10/17), then 40mg  on 10/19. His tacrolimus and Myfortic were held as well, and restarted on 10/19. He was taken for a repeat liver biopsy with VIR on 10/21 and showed lymphoplasmacytic infiltrate was markedly decreased compared to biopsy 9/25. Patient had ascites 2/2 his liver dysfunction. Ascites still present but much improved s/p 25 g albumin and 40 mg lasix 10/26 and stable leading up to discharge.    #Transplant planning:10/21 liver transplant committee determined patient should proceed with complete liver transplant evaluation. 10/28 liver transplant team met with patient and grandmother and Alexandar would like to proceed with preparations for liver transplant. Patient will require PFTs outpatient. Jamey Reas, RN, is his transplant coordinator: [phone]. Luna Kitchens is in charge of appointment scheduling for transplant: [phone]. For urgent calls he can call [phone] and ask for the on-call liver transplant coordinator - they have 24/7 coverage.     #Dark stools:  Repeat fecal occult blood test was negative x 2. His dark stool resolved shortly after admission.     #Severe Malnutrition:  Patient had not had a stable source of food and before presenting to [facility], and he had not eaten in 3-4 days. He is not meeting established goals for growth. He meets criteria for severe malnutrition in the setting of poor intake, possibly compounded by decreased appetite in the setting of liver disease. Nutrition status may be worse than appears from BMI/age given ascites. Elevated alk phos likely due to liver disease vs metabolic bone disease. Pt received IV vitamin K supplementation for elevated PT/INR.  Pt is at increased risk of fat-soluble vitamin deficiencies. We started the patient on regular adult multivitamin. Also started Ca and  Vit D supplementation given patient has increased Ca++, hypophosphatemia, and vitamin D needs with steroid therapy. He was treated with also treated with IV magnesium as needed for low magnesium and was started on oral magnesium supplements. He was also started on oral k-phos (10/16) given low phosphorous levels. He was treated with Aquadeks multivitamin daily.     #Thrombocytopenia   Platelet steadily downtrended during hospital stay, attributed to liver disease and to course of Unasyn for bacteremia as Unasyn can cause myelosuppression. He was given platelet transfusion 10/12 and 10/15 for platelets in the 50k. Platelet count was uptrending on last CBC (Drawn 2 days after completing unasyn course), plt 40 10/29 (from 16 10/26).    #Infectious Disease  On 10/13 Hades was noted to have a sharp increase in his wbc count from normal to abnormal without any localizing sings of infection. His workup included blood cultures (peripheral and PICC line culture) both of which rsulted positive for strep mitis at 12 hours concerning for a true infection. He was started on empiric Ceftriaxone, Vancomycin, Micafungin, and Flagyl (10/13-10/16) given his ascites and immunosuppressed state (given thymoglobulin, steroid, tacrolimus, and Myfortic treatment). His cultures grew strep mitis and one peripheral culture with MSSA at which point his antibiotics were switched from Ceftriaxone, Vancomycin, and Flagyl to Unasyn monotherapy (10/16-10/27). His PICC line was removed on day of discharge, 10/31.     #Endocrine: Hyperglycemia  Isidoro was normoglycemic on admission, and had worsening hyperglycemia during his hospital stay secondary to treatment with high dose steroids. Adult Endocrinology was consulted and he was managed on Lantus 10 units nighty, 3-6 units with meals (3 units if he ate 1/2 of his meal), and sliding scale with meals. Finley and his grandmother received diabetes education prior to discharge. Manford was able to demonstrate proper administration of his insulin to staff prior to discharge, and grandmother was trained to administer insulin as well.     #AKI  Creatinine bumped at beginning of hospitalization, multifactorial etiology. Nephrology was consulted and he was found to have ATN, which resolved with hydration (IV and oral). His creatinine slowly improved and was monitored closely during his hospital stay. On 10/3, 10/4, and 10/14 he received 5% albumin infusion. Creatinine on day of discharge was improved, 0.88, but not back to baseline. Continued to encourage patient to drink PO fluids.      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   ??  Irritant contact dermatitis, hands treated with ammonium lactate 12% lotion twice daily to hands.   ??  Impetigo of the right superior auricular sulcus, crusted papule on the left neck and right ear with MSSA on culture treated with topical mupirocin until healed.     Oral Thrush: Nystatin oral suspension 1,000,000 units TID    #Major Depressive Disorder  Has history of attempted overdoses. Psych consulted, per patient preference, only likes to be seen by adult Psych team. His Melatonin dose was increased to 5mg  nightly. On 10/30 he was started Effexor 24 hr capsule 37.5 mg with plans to titrate it up outpatient (please see instructions on PCP f/u at bottom of this note).     #Homelessness: Kysen has experienced homelessness for ~5 months when he left his mother's house. He  has severed all ties with his family and states his relationship with his mother cannot be reconciled. He also requested contact information of family members to be erased from his chart (this has been completed, but social worker did drop contact info into her note on 9/24) and he more recently has reconciled with some family members. ??In addition to food insecurity, he has not been able to secure medications. He is routinely exposed to alcohol and drugs (marijuana, cocaine, etc.) second-hand through his friendships though he denies current use himself. On urine drug screen, he tested positive for marijuana only. He designated a personal friend [name]) and grandfather to be his HCPOAs (Friend is the Primary). He spoke with sister on 10/16 (has not been in regular contact). He will be discharged in the care of his paternal grandmother.      Artist and his grandmother were given a medication schedule by our pharmacist and a pill bottle was prepared for help with a smooth transition to home and to try to help with medical compliance.

## 2019-08-05 NOTE — Unmapped (Signed)
[name] Gross has been doing well today. No c/o pain or nausea. His VSS. He has been eating and drinking well. He had an liver ultrasound today. His PIV is infusing D5NS@100 . Urinalysis and occult stool sent today. Urine is dark. Stool is black/brown and solid. No family at bedside, plan continue to monitor.

## 2019-08-05 NOTE — Unmapped (Signed)
3 cores taken, bedrest until 1900. To pacu with anesthesia.

## 2019-08-05 NOTE — Unmapped (Signed)
Pt remained afebrile, VSS throughout shift. No complaints of pain. L AC PIV c.d.I, infusing IVF as ordered. NPO at midnight for liver biopsy in AM. Sclera remain jaundice. L ab incision c.d.I. Mom called for update, pt refused. Will continue to monitor.    Problem: Adult Inpatient Plan of Care  Goal: Plan of Care Review  Outcome: Ongoing - Unchanged  Goal: Patient-Specific Goal (Individualization)  Outcome: Ongoing - Unchanged  Goal: Absence of Hospital-Acquired Illness or Injury  Outcome: Ongoing - Unchanged  Goal: Optimal Comfort and Wellbeing  Outcome: Ongoing - Unchanged  Goal: Readiness for Transition of Care  Outcome: Ongoing - Unchanged  Goal: Rounds/Family Conference  Outcome: Ongoing - Unchanged     Problem: Wound  Goal: Optimal Wound Healing  Outcome: Ongoing - Unchanged

## 2019-08-05 NOTE — Unmapped (Signed)
[facility] Interventional Radiology  Post-procedure Note    Patient: [name] Gross    DOB: September 25, 2001  Medical Record Number: [id number]   Note Date/Time: August 05, 2019 4:52 PM     Procedure: Ultrasound-guided percutaneous liver biopsy (non-targeted)    Diagnosis: Rejection    Attending: Orlando Penner, MD, PhD     Fellow/Resident: Coletta Memos    Time out: Prior to the procedure, a time out was performed with all team members present.  During the time out, the patient, procedure and procedure site when applicable were verbally verified by the team members including Dr. Orlando Penner.      Anesthesia:  General Anesthesia    Complications: NONE    Estimated Blood Loss:  5 mL     Specimens: Surgical pathology.     Major Findings: Successful percutaneous liver biopsy with 3 core samples obtained under ultrasound guidance.  Hemostasis of the tract was achieved using Gelfoam slurry.    Plan: Two hours bedrest. Then, per referring provider.    The patient tolerated the procedure well without incident or complication.     Please see detailed procedure note with images in PACS.    Georgian Co, MD, PhD  August 05, 2019 4:52 PM

## 2019-08-05 NOTE — Unmapped (Signed)
Pediatric Daily Progress Note     Assessment/Plan:     Principal Problem:    Liver transplant rejection (CMS-HCC)  Active Problems:    Recurrent major depressive disorder, in partial remission (CMS-HCC)    Malnutrition of mild degree (CMS-HCC)    History of liver transplant (CMS-HCC)    Sickle cell trait (CMS-HCC)    Transaminitis    Anemia    Hypoalbuminemia    Cholangitis of transplanted liver (CMS-HCC)    Abdominal pain    Melena    Homeless    Dark stools  Resolved Problems:    * No resolved hospital problems. [name] Gross is a 18 y.o. male with history of unconfirmed primary sclerosing cholangitis s/p liver transplant (2017) with multiple threats of rejection who now presents with black stools, abdominal pain, and icteric sclera most likely due to acute liver transplant rejection/failure.   He requires care in the hospital for transplant rejection protocol treatment.    # Hx Liver Transplant - Subacute/Acute Liver Failure:  Given [name] Gross' history of difficulty with liver transplant medication adherence due to social concerns and mood changes, hepatosplenomegaly on exam, mild tenderness to palpation in LUQ, transaminitis, hyperbilirubinemia, and icteric sclera his is likely experiencing acute vs subacute rejection of his transplanted liver. Viral hepatitis was also a consideration, but hepatitis panel was negative.  Given recent weight loss and night sweats, HIV testing is underway.  GGT wnl so less likely biliary involvement.      -GI/transplant following, appreciate recs   -Solumedrol 40mg  IV qd   -Mycophenolate 540mg  bid   -Tacrolimus 5mg  bid   - liver biopsy 9/25   - Vit K injection 10 mg 1x 9/24 (INR 1.6 on 9/24 --> 1.46 on 9/25)  - HIV pending   -  CMV viral load not detected, EBV pending   -  Vit D level pending  - Daily BMP, Mg, Ph   - Tacrolimus trough scheduled for 9/26   - MELD score 21 - estimated 3 month mortality of 19.6%    # Abdominal Pain, dark stools:  Unclear etiology, as FOBT x2 negative.  One possibility is recent use of bismuth-containing medications.  Patient did not assess stool color during last BM    #Severe Malnutrition: ??Patient has been skipping meals due to poor appetite and food insecurity. He had not eaten for 2-4 days prior to arriving at [facility].  Low risk of refeeding syndrome   - Nutrition consult, appreciate recs  - Multivitamin, thiamine, Ca 300 mg BID, vit D 1000u QD   - Monitor phosphorous level.  Ph wnl 9/24, 9/25    # Major Depressive Disorder: Stable. Has hx of attempted overdoses, with the last attempt in April 2019 at which point he was hospitalized in the Adolescent Psychiatry unit and discharged on Lexapro 20 mg, melatonin 9 mg.  He has not engaged with mental health resources or taken medication for depression or insomnia in 8-10 months.  Denies SI, passive suicidality, self-harm, or HI at this time.   - Psych consult   -  Melatonin nightly  - consider c/s supportive care     # Homelessness - Social Concerns: [name] Gross has experienced homelessness for ~5 months when he left his mother's house.  He has severed all ties ties with his family and states his relationship with his mother cannot be reconciled.  He also requested contact information of family members to be erased from his chart (this has been completed, but social  worker did drop contact info into her note on 9/24 in case he changes his mind during admission).  In addition to food insecurity, he has not been able to secure medications.  He is routinely exposed to alcohol and drugs (marijuana, cocaine, etc.) second-hand through his friendships though he denies current use himself.  On urine drug screen, he tested positive for marijuana only.  -  Patient plans to be discharged to a local homeless shelter and begin job hunting  -  SW will Chief Technology Officer for [employer] and homeless shelters in [place], [place], and [place]    Access: PIV  ??  Discharge criteria: liver transplant work-up    Plan of care discussed with caregiver(s) at bedside.      Subjective:     Interval History: Had BM yesterday but did not check color.  Denies pain.  No overnight events.    Objective:     Vital signs in last 24 hours:  Temp:  [36.4 ??C (97.5 ??F)-36.7 ??C (98.1 ??F)] 36.4 ??C (97.5 ??F)  Heart Rate:  [74-83] 78  SpO2 Pulse:  [81] 81  Resp:  [16-20] 16  BP: (95-98)/(53-59) 98/59  MAP (mmHg):  [66-69] 69  SpO2:  [99 %-100 %] 100 %  Intake/Output last 3 shifts:  I/O last 3 completed shifts:  In: 2576 [P.O.:1206; I.V.:1370]  Out: 951 [Urine:951]    Physical Exam:  General:   alert, active, in no acute distress, cachetic  Head:  atraumatic and normocephalic  Eyes:   pupils equal, round, reactive to light and sclera icteric   Oropharynx:   moist mucous membranes without erythema, exudates or petechiae  Lungs:   clear to auscultation, no wheezing, crackles or rhonchi, breathing unlabored  Heart:   regular rate and rhythm, normal S1, S2, no murmurs or gallops.  Abdomen:   soft, umbilicus normal, symmetric, spleen non-palpable, scar from prior liver transplant present, hepatomegaly, liver edge palpable 2 cm below costal margin  Extremities:   moves all extremities equally, capillary refill:  fair , no swelling  Rectal:  not examined  Skin:   warm, no rashes, no ecchymosis, jaundice noticeable at fingertips    Active Medications reviewed and KEY Medications include:     Current Facility-Administered Medications:   ???  calcium carbonate (OS-CAL) tablet 300 mg elem calcium, 300 mg elem calcium, Oral, BID, Anastasia Pall, MD, 300 mg elem calcium at 08/05/19 1002  ???  cholecalciferol (vitamin D3) tablet 1,000 Units, 1,000 Units, Oral, Daily, Anastasia Pall, MD, 1,000 Units at 08/05/19 1003  ???  dextrose 5 % and sodium chloride 0.9 % infusion, 100 mL/hr, Intravenous, Continuous, Anastasia Pall, MD, Last Rate: 100 mL/hr at 08/05/19 1448, 100 mL/hr at 08/05/19 1448  ???  famotidine (PEPCID) tablet 20 mg, 20 mg, Oral, BID, Anastasia Pall, MD, 20 mg at 08/05/19 1003  ???  influenza vaccine quad (FLUARIX, FLULAVAL, FLUZONE) (6 MOS & UP) 2020-21, 0.5 mL, Intramuscular, During hospitalization, Anastasia Pall, MD  ???  melatonin tablet 3 mg, 3 mg, Oral, QPM, Anastasia Pall, MD  ???  methylPREDNISolone sodium succinate (PF) (Solu-MEDROL) injection 40 mg, 40 mg, Intravenous, Daily, Anastasia Pall, MD, 40 mg at 08/05/19 1003  ???  multivitamins, therapeutic with minerals tablet 1 tablet, 1 tablet, Oral, Daily, Anastasia Pall, MD, 1 tablet at 08/05/19 1003  ???  mycophenolate (MYFORTIC) EC tablet 540 mg, 540 mg, Oral, BID, Anastasia Pall, MD, 540 mg at 08/05/19 1002  ???  tacrolimus (PROGRAF) capsule 5 mg, 5 mg, Oral, BID, Anastasia Pall, MD, 5 mg at 08/05/19 1002  ???  thiamine (B-1) tablet 100 mg, 100 mg, Oral, Daily, Anastasia Pall, MD, 100 mg at 08/05/19 1002        Studies: Personally reviewed and interpreted.  Labs/Studies:  Labs and Studies from the last 24hrs per EMR and Reviewed     Liver Transplant [place] 9/24:    IMPRESSION:  -  Patent hepatic transplant vasculature.  -  Mild interval increase in the left hepatic transplant artery resistive index, now with all hepatic transplant artery resistive indices within normal limits.   -  The liver is heterogeneous in echotexture with periportal edema, nonspecific however can be seen in hepatitis or rejection.  -  Small volume ascites.    ========================================    Peggye Pitt, MS3    I attest that I have reviewed the student note and that the components of the history of the present illness, the physical exam, and the assessment and plan documented were performed by me or were performed in my presence by the student where I verified the documentation and performed (or re-performed) the exam and medical decision making.   Fayola Meckes A [employer].

## 2019-08-06 LAB — COMPREHENSIVE METABOLIC PANEL
ALBUMIN: 2.1 g/dL — ABNORMAL LOW (ref 3.5–5.0)
ALBUMIN: 1.9 g/dL — ABNORMAL LOW (ref 3.5–5.0)
ALKALINE PHOSPHATASE: 1275 U/L — ABNORMAL HIGH (ref 65–260)
ALKALINE PHOSPHATASE: 1193 U/L — ABNORMAL HIGH (ref 65–260)
ALT (SGPT): 319 U/L — ABNORMAL HIGH (ref <50)
ALT (SGPT): 342 U/L — ABNORMAL HIGH (ref <50)
ANION GAP: 5 mmol/L — ABNORMAL LOW (ref 7–15)
ANION GAP: 4 mmol/L — ABNORMAL LOW (ref 7–15)
AST (SGOT): 380 U/L — ABNORMAL HIGH (ref 19–55)
AST (SGOT): 457 U/L — ABNORMAL HIGH (ref 19–55)
BILIRUBIN TOTAL: 14 mg/dL — ABNORMAL HIGH (ref 0.0–1.2)
BILIRUBIN TOTAL: 12.8 mg/dL — ABNORMAL HIGH (ref 0.0–1.2)
BLOOD UREA NITROGEN: 15 mg/dL (ref 7–21)
BLOOD UREA NITROGEN: 15 mg/dL (ref 7–21)
BUN / CREAT RATIO: 20
BUN / CREAT RATIO: 19
CALCIUM: 7.5 mg/dL — ABNORMAL LOW (ref 8.5–10.2)
CALCIUM: 7.3 mg/dL — ABNORMAL LOW (ref 8.5–10.2)
CO2: 20 mmol/L — ABNORMAL LOW (ref 22.0–30.0)
CO2: 22 mmol/L (ref 22.0–30.0)
CREATININE: 0.75 mg/dL (ref 0.70–1.30)
CREATININE: 0.8 mg/dL (ref 0.70–1.30)
EGFR CKD-EPI AA MALE: >90 mL/min/{1.73_m2} (ref >=60)
EGFR CKD-EPI AA MALE: >90 mL/min/{1.73_m2} (ref >=60)
EGFR CKD-EPI NON-AA MALE: >90 mL/min/{1.73_m2} (ref >=60)
EGFR CKD-EPI NON-AA MALE: >90 mL/min/{1.73_m2} (ref >=60)
GLUCOSE RANDOM: 227 mg/dL — ABNORMAL HIGH (ref 70–179)
GLUCOSE RANDOM: 124 mg/dL (ref 70–179)
POTASSIUM: 3.7 mmol/L (ref 3.5–5.0)
POTASSIUM: 3.6 mmol/L (ref 3.5–5.0)
PROTEIN TOTAL: 8.2 g/dL (ref 6.5–8.3)
PROTEIN TOTAL: 7.4 g/dL (ref 6.5–8.3)
SODIUM: 138 mmol/L (ref 135–145)

## 2019-08-06 LAB — BASIC METABOLIC PANEL
ANION GAP: 8 mmol/L (ref 7–15)
BLOOD UREA NITROGEN: 15 mg/dL (ref 7–21)
BUN / CREAT RATIO: 16
CALCIUM: 7.9 mg/dL — ABNORMAL LOW (ref 8.5–10.2)
CO2: 21 mmol/L — ABNORMAL LOW (ref 22.0–30.0)
CREATININE: 0.95 mg/dL (ref 0.70–1.30)
EGFR CKD-EPI AA MALE: >90 mL/min/{1.73_m2} (ref >=60)
EGFR CKD-EPI NON-AA MALE: >90 mL/min/{1.73_m2} (ref >=60)
POTASSIUM: 3.4 mmol/L — ABNORMAL LOW (ref 3.5–5.0)
SODIUM: 142 mmol/L (ref 135–145)

## 2019-08-06 LAB — MAGNESIUM
Magnesium:MCnc:Pt:Ser/Plas:Qn:: 2.2
Magnesium:MCnc:Pt:Ser/Plas:Qn:: 2.3 — ABNORMAL HIGH

## 2019-08-06 LAB — CBC
HEMATOCRIT: 34.1 % — ABNORMAL LOW (ref 41.0–53.0)
HEMOGLOBIN: 11 g/dL — ABNORMAL LOW (ref 13.5–17.5)
MEAN CORPUSCULAR HEMOGLOBIN CONC: 32.1 g/dL (ref 31.0–37.0)
MEAN CORPUSCULAR HEMOGLOBIN: 26 pg (ref 26.0–34.0)
MEAN CORPUSCULAR VOLUME: 81 fL (ref 80.0–100.0)
MEAN PLATELET VOLUME: 8.4 fL (ref 7.0–10.0)
PLATELET COUNT: 265 10*9/L (ref 150–440)
RED BLOOD CELL COUNT: 4.21 10*12/L — ABNORMAL LOW (ref 4.50–5.90)
WBC ADJUSTED: 10.2 10*9/L (ref 4.5–11.0)

## 2019-08-06 LAB — VITAMIN D, TOTAL (25OH): Lab: 4.7 — ABNORMAL LOW

## 2019-08-06 LAB — PHOSPHORUS
Phosphate:MCnc:Pt:Ser/Plas:Qn:: 2.5 — ABNORMAL LOW
Phosphate:MCnc:Pt:Ser/Plas:Qn:: 3.3

## 2019-08-06 LAB — CHLORIDE: Chloride:SCnc:Pt:Ser/Plas:Qn:: 111 — ABNORMAL HIGH

## 2019-08-06 LAB — SPECIFIC GRAVITY UA: Specific gravity:Rden:Pt:Urine:Qn:: 1.015

## 2019-08-06 LAB — GLUCOSE RANDOM: Glucose:MCnc:Pt:Ser/Plas:Qn:: 227 — ABNORMAL HIGH

## 2019-08-06 LAB — SODIUM URINE: Lab: 65

## 2019-08-06 LAB — TACROLIMUS, TROUGH: Lab: 9

## 2019-08-06 LAB — OSMOLALITY URINE: Lab: 509

## 2019-08-06 LAB — EBV VIRAL LOAD RESULT: Lab: NOT DETECTED

## 2019-08-06 LAB — CREATININE, URINE: Lab: 102

## 2019-08-06 LAB — BLOOD UREA NITROGEN: Urea nitrogen:MCnc:Pt:Ser/Plas:Qn:: 15

## 2019-08-06 LAB — PLATELET COUNT: Platelets:NCnc:Pt:Bld:Qn:Automated count: 265

## 2019-08-06 NOTE — Unmapped (Signed)
Status post liver biopsy yesterday, returned to unit @ 1900, alert and responsive with no c/o discomfort. Was able to drink and eat 100% of meal. Three hours after eating meal patient had large emesis of undigested food while in bathroom having a bowel movement, noted to be weak and trembling requiring two nurse assist back to bed. VS obtained B/P noted to be elevated above baseline. MD made aware and to bedside assessing patient. Ordered for AM labs to be collected at time of event and IV Zofran ordered and administered after second episode of emesis noted. Nausea slowly improving, and patient able to fall asleep. Liver biopsy site remained dry and intact, no c/o tenderness noted to site. Maintained on IVF @ 100 ml/hr. No further events noted throughout the night, rested quietly, will continue to monitor.   Problem: Adult Inpatient Plan of Care  Goal: Plan of Care Review  Outcome: Progressing  Goal: Patient-Specific Goal (Individualization)  Outcome: Progressing  Goal: Absence of Hospital-Acquired Illness or Injury  Outcome: Progressing  Goal: Optimal Comfort and Wellbeing  Outcome: Progressing  Goal: Readiness for Transition of Care  Outcome: Progressing  Goal: Rounds/Family Conference  Outcome: Progressing     Problem: Wound  Goal: Optimal Wound Healing  Outcome: Progressing

## 2019-08-06 NOTE — Unmapped (Signed)
[name] Gross has been doing well today. No c/o pain or nausea. His VSS. Scleras yellow. He has been NPO today for his liver biopsy. His PIV is infusing D5NS@100 . No family at bedside, plan continue to monitor.

## 2019-08-06 NOTE — Unmapped (Signed)
Pediatric Daily Progress Note     Assessment/Plan:     Principal Problem:    Liver transplant rejection (CMS-HCC)  Active Problems:    Recurrent major depressive disorder, in partial remission (CMS-HCC)    Malnutrition of mild degree (CMS-HCC)    History of liver transplant (CMS-HCC)    Sickle cell trait (CMS-HCC)    Transaminitis    Anemia    Hypoalbuminemia    Cholangitis of transplanted liver (CMS-HCC)    Abdominal pain    Melena    Homeless    Dark stools  Resolved Problems:    * No resolved hospital problems. [name] Gross is a 18 y.o. 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 He remains clinically stable however fluid electrolyte shifts continue to be managed while immune suppression increases in light of significant liver injury. He requires care in the hospital for transplant rejection protocol treatment.    # Hx Liver Transplant - Subacute/Acute Liver Failure:  Given [name] Gross' history of difficulty with liver transplant medication adherence due to social concerns and mood changes, hepatosplenomegaly on exam, mild tenderness to palpation in LUQ, transaminitis, hyperbilirubinemia, and icteric sclera his is likely experiencing acute vs subacute rejection of his transplanted liver. Viral hepatitis was also a consideration, but hepatitis panel was negative.  Given recent weight loss and night sweats, HIV testing is underway.  GGT wnl so less likely biliary involvement.      -GI/transplant following, appreciate recs   -Solumedrol 40mg  IV qd --> Increased to 540 mg pulse  -Mycophenolate 540mg  bid   - Tacrolimus trough 9/26 elevated. Reduced Tacrolimus 5mg  bid ->3mg  BID  - liver biopsy completed 9/25; RAI 9/9  - 3x Vit K injection 10 mg. Give Vit K today 9/26 and tomorrow 9/27. Recheck INR on 9/27.   - HIV pending   - CMV viral load not detected, EBV pending   - Vit D level pending  - Daily BMP, Mg, Ph   - MELD score 21 - estimated 3 month mortality of 19.6%    # Abdominal Pain, dark stools:  Unclear etiology, as FOBT x2 negative.  One possibility is recent use of bismuth-containing medications.  Patient did not assess stool color during last BM    #Severe Malnutrition: ??Patient has been skipping meals due to poor appetite and food insecurity. He had not eaten for 2-4 days prior to arriving at [facility].  Low risk of refeeding syndrome   - Nutrition consult, appreciate recs  - Thiamine completed 9/26  - Multivitamin, Ca 300 mg BID, vit D 1000u QD   - Monitor phosphorous level. Replenish with nutriphos today. Now normalized  - Refeeding labs to continue  - Consider EKG repeat    # Major Depressive Disorder: Stable. Has hx of attempted overdoses, with the last attempt in April 2019 at which point he was hospitalized in the Adolescent Psychiatry unit and discharged on Lexapro 20 mg, melatonin 9 mg.  He has not engaged with mental health resources or taken medication for depression or insomnia in 8-10 months.  Denies SI, passive suicidality, self-harm, or HI at this time.   - Psych consulted   - Melatonin nightly  - consider c/s supportive care     # Homelessness - Social Concerns: [name] Gross has experienced homelessness  for ~5 months when he left his mother's house.  He has severed all ties ties with his family and states his relationship with his mother cannot be reconciled.  He also requested contact information of family members to be erased from his chart (this has been completed, but social worker did drop contact info into her note on 9/24 in case he changes his mind during admission).  In addition to food insecurity, he has not been able to secure medications.  He is routinely exposed to alcohol and drugs (marijuana, cocaine, etc.) second-hand through his friendships though he denies current use himself.  On urine drug screen, he tested positive for marijuana only.  - Patient plans to be discharged to a local homeless shelter and begin job hunting  - SW will Chief Technology Officer for [employer] and homeless shelters in [place], [place], and [place]    FEN/GI:??Abnormal electrolyte levels in setting of recent emesis and normal saline (patients with liver disease should be on 1/2NS). Will discontinue fluids, encourage PO intake, and recheck chem10 in the afternoon.   - DISCONTINUE fluids today and encourage PO intake. Monitor fluid status and daily weights. Low threshold for ab U/S to assess for ascites  - Recheck Chem10 in PM and tomorrow am  - Renal work up for possible Liver disease/TAC kidney injury explaining electrolyte derangements. Urine lytes and serum labs ordered this evening however Tevon refused. Will retry in the AM.   - Avoid D5 fluids, as patient has developed diabetes in the past (bg 227 on night of 9/25)  -Zofran q8 hours as needed  -Regular diet     Access: PIV  ??  Discharge criteria: liver transplant work-up    Plan of care discussed with caregiver(s) at bedside.      Subjective:     Interval History: Had episode of emesis overnight after eating large meal.  Solid food was visible in emesis. Switched maintenance IV fluids from D5NS to??D5 1/2NS due to hyperchloremia/acidemia.      Objective:     Vital signs in last 24 hours:  Temp:  [36.2 ??C-36.6 ??C] 36.6 ??C  Heart Rate:  [73-100] 76  SpO2 Pulse:  [76] 76  Resp:  [16-34] 16  BP: (92-123)/(54-77) 115/58  MAP (mmHg):  [65-74] 74  SpO2:  [100 %] 100 %  Intake/Output last 3 shifts:  I/O last 3 completed shifts:  In: 4155.7 [P.O.:2040; I.V.:2115.7]  Out: 1501 [Urine:1500; Emesis/NG output:1]    Physical Exam:  General:   alert, active, in no acute distress, cachetic  Head:  atraumatic and normocephalic  Eyes:   pupils equal, round, reactive to light and sclera icteric   Oropharynx:   moist mucous membranes without erythema, exudates or petechiae  Lungs:   clear to auscultation, no wheezing, crackles or rhonchi, breathing unlabored  Heart:   regular rate and rhythm, normal S1, S2, no murmurs or gallops.  Abdomen:   soft, umbilicus normal, symmetric, spleen non-palpable, scar from prior liver transplant present, hepatomegaly, liver edge palpable 2 cm below costal margin  Extremities:   moves all extremities equally, capillary refill:  fair , no swelling  Rectal:  not examined  Skin:   warm, no rashes, no ecchymosis, jaundice noticeable at fingertips    Active Medications reviewed and KEY Medications include:     Current Facility-Administered Medications:   ???  calcium carbonate (OS-CAL) tablet 300 mg elem calcium, 300 mg elem calcium, Oral, BID, Anastasia Pall, MD, 300 mg elem calcium at 08/06/19  2956  ???  cholecalciferol (vitamin D3) tablet 1,000 Units, 1,000 Units, Oral, Daily, Anastasia Pall, MD, 1,000 Units at 08/06/19 0956  ???  influenza vaccine quad (FLUARIX, FLULAVAL, FLUZONE) (6 MOS & UP) 2020-21, 0.5 mL, Intramuscular, During hospitalization, Anastasia Pall, MD  ???  melatonin tablet 3 mg, 3 mg, Oral, QPM, Anastasia Pall, MD, 3 mg at 08/06/19 0014  ???  methylPREDNISolone sodium succinate (PF) (Solu-MEDROL) injection 500 mg, 500 mg, Intravenous, Once, Almon Register, MD  ???  multivitamins, therapeutic with minerals tablet 1 tablet, 1 tablet, Oral, Daily, Anastasia Pall, MD, 1 tablet at 08/06/19 2130  ???  mycophenolate (MYFORTIC) EC tablet 540 mg, 540 mg, Oral, BID, Anastasia Pall, MD, 540 mg at 08/06/19 1009  ???  pantoprazole (PROTONIX) EC tablet 40 mg, 40 mg, Oral, Daily, Almon Register, MD  ???  [START ON 08/07/2019] phytonadione (vitamin K1) (AQUA-MEPHYTON) injection 10 mg, 10 mg, Intramuscular, Once, Curtis Sites, MD  ???  potassium & sodium phosphates 250mg  (PHOS-NAK/NEUTRA PHOS) packet 1 packet, 1 packet, Oral, 4x Daily, Val Eagle, 1 packet at 08/06/19 1653  ???  tacrolimus (PROGRAF) capsule 3 mg, 3 mg, Oral, BID, Almon Register, MD        Studies: Personally reviewed and interpreted.  Labs/Studies:  Labs and Studies from the last 24hrs per EMR and Reviewed     Liver Transplant [place] 9/24:    IMPRESSION:  -  Patent hepatic transplant vasculature.  -  Mild interval increase in the left hepatic transplant artery resistive index, now with all hepatic transplant artery resistive indices within normal limits.   -  The liver is heterogeneous in echotexture with periportal edema, nonspecific however can be seen in hepatitis or rejection.  -  Small volume ascites.    ========================================    Peggye Pitt, MS3    RESIDENT ATTESTATION  I attest that I have reviewed the student note and that the components of the history of the present illness, the physical exam, and the assessment and plan documented were performed by me or were performed in my presence by the student where I verified the documentation and performed (or re-performed) the exam and medical decision making.     Salvatore Decent Belva Chimes, MD  PGY-1, Anesthesiology   [phone]    I supervised the medical student and resident physician on subsequent day care who spent at least 35 minutes on the floor or unit in direct patient care. The direct patient care time included face-to-face time with the patient, reviewing the patient's chart, communicating with the family and/or other professionals and coordinating care. Greater than 50% of this time was spent in counseling or coordinating care with the patient regarding management of liver rejection with associated malnutrition and electrolyte derangements. I was available throughout care provided.    Pablo Ledger MD, MPH, MMSc.  Attending Physician; Peds GI/Hepatology/Nutrition  [facility]

## 2019-08-06 NOTE — Unmapped (Signed)
Pediatric Tacrolimus Therapeutic Monitoring Pharmacy Note    [name] Gross is a 18 y.o. male continuing tacrolimus.     Indication: Liver transplant     Date of Transplant: 09/22/2016      Prior Dosing Information: Tacrolimus capsules 5 mg (0.09 mg/kg/DOSE) PO every 12 hours    Dosing Weight: 55.9 kg    Goals:  Therapeutic Drug Levels  Tacrolimus trough goal: 5-7 ng/mL    Additional Clinical Monitoring/Outcomes  ?? Monitor renal function (SCr and urine output) and liver function (LFTs)  ?? Monitor for signs/symptoms of adverse events (e.g., hyperglycemia, hyperkalemia, hypomagnesemia, hypertension, headache, tremor)    Results:   Tacrolimus level: 9 ng/mL, drawn slightly late (~12.6 hours post-dose). True 12-hour trough is likely higher and above the desired range of 5-7 ng/mL.    Longitudinal Dose Monitoring:  Date Dose (mg)  AM / PM AM Scr (mg/dL) Level  (ng/mL) Key Drug Interactions   08/06/19 5 mg / 3 mg 0.8 9 None     Pharmacokinetic Considerations and Significant Drug Interactions:  ? Concurrent hepatotoxic medications: None identified  ? Concurrent CYP3A4 substrates/inhibitors: None identified  ? Concurrent nephrotoxic medications: None identified    Assessment/Plan:  Recommendedation(s)  ?? Decrease regimen to tacrolimus capsules 3 mg (0.05 mg/kg/DOSE) PO every 12 hours  ?? Repeat tacrolimus level in 2-3 days to ensure steady state concentration         Follow-up  ? Next level ordered: 08/08/19 at 1000  ? A pharmacist will continue to monitor and recommend levels as appropriate    The above plan was discussed and approved by Blenda Mounts, MD    Please page service pharmacist with questions/clarifications.    Aura Fey, PharmD  Pediatric Clinical Pharmacist  [phone] (pager)  [phone] (pediatric pharmacy)

## 2019-08-07 LAB — INR: Lab: 1.31

## 2019-08-07 LAB — CREATININE
CREATININE: 1.38 mg/dL — ABNORMAL HIGH (ref 0.70–1.30)
Creatinine:MCnc:Pt:Ser/Plas:Qn:: 1.38 — ABNORMAL HIGH

## 2019-08-07 LAB — COMPREHENSIVE METABOLIC PANEL
ALBUMIN: 2.2 g/dL — ABNORMAL LOW (ref 3.5–5.0)
ALKALINE PHOSPHATASE: 1441 U/L — ABNORMAL HIGH (ref 65–260)
ALT (SGPT): 358 U/L — ABNORMAL HIGH (ref <50)
ANION GAP: 2 mmol/L — ABNORMAL LOW (ref 7–15)
AST (SGOT): 352 U/L — ABNORMAL HIGH (ref 19–55)
BILIRUBIN TOTAL: 14.1 mg/dL — ABNORMAL HIGH (ref 0.0–1.2)
BLOOD UREA NITROGEN: 23 mg/dL — ABNORMAL HIGH (ref 7–21)
CALCIUM: 7.5 mg/dL — ABNORMAL LOW (ref 8.5–10.2)
CHLORIDE: 112 mmol/L — ABNORMAL HIGH (ref 98–107)
CO2: 23 mmol/L (ref 22.0–30.0)
CREATININE: 1.38 mg/dL — ABNORMAL HIGH (ref 0.70–1.30)
EGFR CKD-EPI AA MALE: 85 mL/min/{1.73_m2} (ref >=60)
EGFR CKD-EPI NON-AA MALE: 74 mL/min/{1.73_m2} (ref >=60)
GLUCOSE RANDOM: 182 mg/dL — ABNORMAL HIGH (ref 70–179)
POTASSIUM: 4.3 mmol/L (ref 3.5–5.0)
PROTEIN TOTAL: 8 g/dL (ref 6.5–8.3)
SODIUM: 137 mmol/L (ref 135–145)

## 2019-08-07 LAB — CREATININE, URINE
CREATININE, URINE: 77.2 mg/dL
Lab: 77.2

## 2019-08-07 LAB — URINALYSIS
BLOOD UA: NEGATIVE
GLUCOSE UA: NEGATIVE
KETONES UA: NEGATIVE
LEUKOCYTE ESTERASE UA: NEGATIVE
NITRITE UA: NEGATIVE
PH UA: 5 (ref 5.0–9.0)
PROTEIN UA: NEGATIVE
RBC UA: <1 /HPF (ref <=3)
SPECIFIC GRAVITY UA: 1.016 (ref 1.003–1.030)
SQUAMOUS EPITHELIAL: 1 /HPF (ref 0–5)
UROBILINOGEN UA: 4 — AB
WBC UA: 3 /HPF — ABNORMAL HIGH (ref <=2)

## 2019-08-07 LAB — PROTIME-INR: PROTIME: 15.1 s — ABNORMAL HIGH (ref 10.2–13.1)

## 2019-08-07 LAB — UREA NITROGEN URINE: Lab: 560

## 2019-08-07 LAB — OSMOLALITY MEASURED: Osmolality:Osmol:Pt:Ser/Plas:Qn:: 296 — ABNORMAL HIGH

## 2019-08-07 LAB — MAGNESIUM: Magnesium:MCnc:Pt:Ser/Plas:Qn:: 2.1

## 2019-08-07 LAB — HIV ANTIGEN/ANTIBODY COMBO: HIV 1+2 Ab+HIV1 p24 Ag:PrThr:Pt:Ser/Plas:Ord:IA: NONREACTIVE

## 2019-08-07 LAB — ALKALINE PHOSPHATASE: Alkaline phosphatase:CCnc:Pt:Ser/Plas:Qn:: 1441 — ABNORMAL HIGH

## 2019-08-07 LAB — PHOSPHORUS: Phosphate:MCnc:Pt:Ser/Plas:Qn:: 4.9 — ABNORMAL HIGH

## 2019-08-07 LAB — SODIUM URINE: Lab: 62

## 2019-08-07 LAB — UREA NITROGEN, URINE: UREA NITROGEN URINE: 560 mg/dL

## 2019-08-07 LAB — PROTEIN UA: Protein:MCnc:Pt:Urine:Qn:Test strip: NEGATIVE

## 2019-08-07 NOTE — Unmapped (Signed)
Pediatric Daily Progress Note     Assessment/Plan:     Principal Problem:    Liver transplant rejection (CMS-HCC)  Active Problems:    Recurrent major depressive disorder, in partial remission (CMS-HCC)    Malnutrition of mild degree (CMS-HCC)    History of liver transplant (CMS-HCC)    Sickle cell trait (CMS-HCC)    Transaminitis    Anemia    Hypoalbuminemia    Cholangitis of transplanted liver (CMS-HCC)    Abdominal pain    Melena    Homeless    Dark stools  Resolved Problems:    * No resolved hospital problems. 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 He??requires care in the hospital for transplant rejection protocol treatment.  ??  # Hx Liver Transplant - Subacute/Acute Liver Failure:  Pt has biopsy confirmed severe acute rejection.     -GI/transplant following, appreciate recs   -Solumedrol 40mg  IV qd --> Increased to 560 mg pulse today 9/27  -Mycophenolate 540mg  bid   -Tacrolimus trough 9/26 elevated. Reduced Tacrolimus 5mg  bid ->3mg  BID  -Liver biopsy completed 9/25; RAI 9/9  - 3x Vit K injection 10 mg. last dose 9/27. INR improved from 1.6 on admission to 1.3 (9/27),  Recheck INR on 9/28.   - HIV negative   - CMV viral load not detected, EBV pending   - Vit D level pending  - Daily CMP, Mg, Ph -- Discussed the utility of a PICC placement  - MELD score 21 - estimated 3 month mortality of 19.6%  ??  # Abdominal Pain, dark stools:  Unclear etiology, as FOBT x2 negative.  One possibility is recent use of bismuth-containing medications.      #Severe Malnutrition: ??Patient has been skipping meals due to poor appetite and food insecurity. He had not eaten for 2-4 days prior to arriving at [facility].   - Nutrition consult, appreciate recs  - Thiamine completed 9/26  - Multivitamin, Ca 300 mg BID, vit D 1000u QD   - Monitor phosphorous level. Replenish with nutriphos today. Now normalized  - Refeeding labs to continue  - Consider EKG repeat  ??  # Major Depressive Disorder: Stable. Has hx of attempted overdoses, with the last attempt in April 2019 at which point he was hospitalized in the Adolescent Psychiatry unit and discharged on Lexapro 20 mg, melatonin 9 mg.  He has not engaged with mental health resources or taken medication for depression or insomnia in 8-10 months.  Denies SI, passive suicidality, self-harm, or HI at this time.   - Psych consulted   - Melatonin nightly  - consider c/s supportive care   ??  # Homelessness - Social Concerns:??Yahye has experienced homelessness for ~5 months when he left his mother's house.  He has severed all ties ties with his family and states his relationship with his mother cannot be reconciled.  He also requested contact information of family members to be erased from his chart (this has been completed, but social worker did drop contact info into her note on 9/24 in case he changes his mind during admission).  In addition to food insecurity, he has not been able to secure medications.  He is routinely exposed to alcohol and drugs (marijuana, cocaine, etc.) second-hand through his friendships though he denies current use himself.  On urine drug screen, he tested positive for marijuana only.  - Patient plans to be discharged to a local homeless shelter and begin job hunting  - SW will Chief Technology Officer for [employer] and homeless shelters in [place], [place], and [place]  ??  FEN/GI:??Abnormal electrolyte levels in setting of recent emesis and normal saline (patients with liver disease should be on 1/2NS). Will discontinue fluids, encourage PO intake, and recheck chem10 in the afternoon.   - Monitor fluid status and daily weights closely. Low threshold for ab U/S to assess for ascites  - Daily chemistries  - Renal work up for possible Liver disease/TAC kidney injury explaining electrolyte derangements. Creatinine up-trending  Urine lytes and serum labs ordered. Will request for a nephrology consult  - Avoid D5 fluids, as patient has developed diabetes in the past (bg 227 on night of 9/25)  -Zofran q8 hours as needed  -Protonix 40 mg daily  -Regular diet   ??  Access:??PIV  ??  Discharge criteria:??liver transplant work-up and treatment of acute rejection      Subjective:     Interval History: No acute events overnight.     Objective:     Vital signs in last 24 hours:  Temp:  [36.3 ??C-36.9 ??C] 36.9 ??C  Heart Rate:  [72-85] 72  Resp:  [16-18] 16  BP: (92-115)/(54-58) 112/57  MAP (mmHg):  [65-74] 73  SpO2:  [100 %] 100 %  Intake/Output last 3 shifts:  I/O last 3 completed shifts:  In: 3458.7 [P.O.:2040; I.V.:1418.7]  Out: 1251 [Urine:1250; Emesis/NG output:1]    Physical Exam:  Gen: Thin young male lying in bed in no acute distress  Eyes: Sclera icteric, conjunctivae non-injected.  ENT: External ear normal, mucous membranes moist  Heart: Regular rate and rhythm without murmurs, rubs, or gallops.  S1 and S2 normal. 2+ radial pulses   Pulmonary: Good air movement. Clear to auscultation bilaterally. Normal work of breathing  Skin: No rashes  Abd: Normal bowel sounds, soft, non-tender. Hepatomegaly with right upper quadrant tenderness on exam.   Ext: Warm and well perfused without cyanosis, clubbing, or edema.  Neuro: No focal findings      Active Medications reviewed and KEY Medications include:       Current Facility-Administered Medications:   ???  calcium carbonate (OS-CAL) tablet 300 mg elem calcium, 300 mg elem calcium, Oral, BID, Anastasia Pall, MD, 300 mg elem calcium at 08/07/19 0920  ??? cholecalciferol (vitamin D3) tablet 1,000 Units, 1,000 Units, Oral, Daily, Anastasia Pall, MD, 1,000 Units at 08/06/19 0956  ???  diphenhydrAMINE (BENADRYL) capsule/tablet 50 mg, 50 mg, Oral, Nightly PRN, Audrie Lia, MD, 50 mg at 08/06/19 2230  ???  influenza vaccine quad (FLUARIX, FLULAVAL, FLUZONE) (6 MOS & UP) 2020-21, 0.5 mL, Intramuscular, During hospitalization, Anastasia Pall, MD  ???  melatonin tablet 3 mg, 3 mg, Oral, QPM, Anastasia Pall, MD, 3 mg at 08/06/19 2300  ???  multivitamins, therapeutic with minerals tablet 1 tablet, 1 tablet, Oral, Daily, Anastasia Pall, MD, 1 tablet at 08/07/19 0919  ???  mycophenolate (MYFORTIC) EC tablet 540 mg, 540 mg, Oral, BID, Anastasia Pall, MD, 540 mg at 08/07/19 0920  ???  pantoprazole (PROTONIX) EC tablet 40 mg, 40 mg, Oral, Daily, Pennie Rushing  Sherre Scarlet, MD, 40 mg at 08/06/19 2211  ???  tacrolimus (PROGRAF) capsule 3 mg, 3 mg, Oral, BID, Almon Register, MD, 3 mg at 08/07/19 1610        Studies: Personally reviewed and interpreted.  Labs/Studies:  Labs and Studies from the last 24hrs per EMR and Reviewed and   All lab results last 24 hours:    Recent Results (from the past 24 hour(s))   Basic Metabolic Panel    Collection Time: 08/06/19  2:40 PM   Result Value Ref Range    Sodium 142 135 - 145 mmol/L    Potassium 3.4 (L) 3.5 - 5.0 mmol/L    Chloride 113 (H) 98 - 107 mmol/L    CO2 21.0 (L) 22.0 - 30.0 mmol/L    Anion Gap 8 7 - 15 mmol/L    BUN 15 7 - 21 mg/dL    Creatinine 9.60 4.54 - 1.30 mg/dL    BUN/Creatinine Ratio 16     EGFR CKD-EPI Non-African American, Male >90 >=60 mL/min/1.16m2    EGFR CKD-EPI African American, Male >90 >=60 mL/min/1.80m2    Glucose 163 70 - 179 mg/dL    Calcium 7.9 (L) 8.5 - 10.2 mg/dL   Magnesium Level    Collection Time: 08/06/19  2:40 PM   Result Value Ref Range    Magnesium 2.2 1.6 - 2.2 mg/dL   Phosphorus Level    Collection Time: 08/06/19  2:40 PM   Result Value Ref Range    Phosphorus 3.3 2.9 - 4.7 mg/dL   Specific Gravity, Urine Collection Time: 08/06/19  5:14 PM   Result Value Ref Range    Specific Gravity, UA 1.015 1.003 - 1.030   Sodium, Random Urine    Collection Time: 08/06/19  5:14 PM   Result Value Ref Range    Sodium, Ur 65 Undefined mmol/L   Creatinine, Urine    Collection Time: 08/06/19  5:14 PM   Result Value Ref Range    Creat U 102.0 Undefined mg/dL   Osmolality, Random Urine    Collection Time: 08/06/19  5:14 PM   Result Value Ref Range    Osmolality, Ur 509 mOsm/kg   POCT Glucose    Collection Time: 08/06/19 10:31 PM   Result Value Ref Range    Glucose, POC 141 70 - 179 mg/dL   ECG 12 Lead    Collection Time: 08/07/19 10:10 AM   Result Value Ref Range    EKG Systolic BP  mmHg    EKG Diastolic BP  mmHg    EKG Ventricular Rate 76 BPM    EKG Atrial Rate 76 BPM    EKG P-R Interval 136 ms    EKG QRS Duration 86 ms    EKG Q-T Interval 416 ms    EKG QTC Calculation 468 ms    EKG Calculated P Axis 72 degrees    EKG Calculated R Axis 65 degrees    EKG Calculated T Axis 49 degrees    QTC Fredericia 449 ms   POCT Glucose    Collection Time: 08/07/19 10:59 AM   Result Value Ref Range    Glucose, POC 113 70 - 179 mg/dL   Magnesium Level    Collection Time: 08/07/19 12:50 PM   Result Value Ref Range    Magnesium 2.1 1.6 - 2.2 mg/dL   Phosphorus Level    Collection Time: 08/07/19 12:50 PM   Result Value Ref Range    Phosphorus 4.9 (H) 2.9 -  4.7 mg/dL   PT-INR    Collection Time: 08/07/19 12:50 PM   Result Value Ref Range    PT 15.1 (H) 10.2 - 13.1 sec    INR 1.31    Creatinine    Collection Time: 08/07/19 12:50 PM   Result Value Ref Range    Creatinine 1.38 (H) 0.70 - 1.30 mg/dL    EGFR CKD-EPI Non-African American, Male 77 >=60 mL/min/1.43m2    EGFR CKD-EPI African American, Male 76 >=60 mL/min/1.55m2   Osmolality, Serum    Collection Time: 08/07/19 12:50 PM   Result Value Ref Range    Osmolality Meas 296 (H) 275 - 295 mOsm/kg   Comprehensive Metabolic Panel    Collection Time: 08/07/19 12:50 PM   Result Value Ref Range    Sodium 137 135 - 145 mmol/L    Potassium 4.3 3.5 - 5.0 mmol/L    Chloride 112 (H) 98 - 107 mmol/L    Anion Gap 2 (L) 7 - 15 mmol/L    CO2 23.0 22.0 - 30.0 mmol/L    BUN 23 (H) 7 - 21 mg/dL    Creatinine 8.11 (H) 0.70 - 1.30 mg/dL    BUN/Creatinine Ratio 17     EGFR CKD-EPI Non-African American, Male 69 >=60 mL/min/1.35m2    EGFR CKD-EPI African American, Male 78 >=60 mL/min/1.7m2    Glucose 182 (H) 70 - 179 mg/dL    Calcium 7.5 (L) 8.5 - 10.2 mg/dL    Albumin 2.2 (L) 3.5 - 5.0 g/dL    Total Protein 8.0 6.5 - 8.3 g/dL    Total Bilirubin 91.4 (H) 0.0 - 1.2 mg/dL    AST 782 (H) 19 - 55 U/L    ALT 358 (H) <50 U/L    Alkaline Phosphatase 1,441 (H) 65 - 260 U/L     ========================================  Ejiofor Ezekwe Jr. MD PhD  Pediatrics PGY3  Pager: [phone]      I supervised the resident physician on subsequent day of care who spent at least 35 minutes on the floor or unit in direct patient care. The direct patient care time included face-to-face time with the patient, reviewing the patient's chart, communicating with the family and/or other professionals and coordinating care. Greater than 50% of this time was spent in counseling or coordinating care with the patient regarding worsening liver function with severe fibrosis now on high-dose immune suppression in hopes of turning liver status around. Electrolyte derangements continue with Khoi refusing labs this morning. We were able to collect labs this afternoon and Creatinine continues to rise increasing my concerns for Hepato-renal syndrome. Nephrology consult requested. I was available throughout care provided.     Pablo Ledger MD, MPH, MMSc.  Attending Physician; Peds GI/Hepatology/Nutrition  [facility]

## 2019-08-07 NOTE — Unmapped (Signed)
[name] Gross remains afebrile and VSS. Bendaryl given once for itching overnight. Complained of abdominal pain 6/10 however due to history of liver transplant no pain meds ordered at this time. PIV saline locked. Urine continues to be dark amber and stool some grayish and black in color noted. Accu check @ hs 141mg /dL. Refused am labs despite encouragement of importance. Team notified. Will monitor closely and follow plan of care.    Problem: Wound  Goal: Optimal Wound Healing  Outcome: Progressing     Problem: Adult Inpatient Plan of Care  Goal: Plan of Care Review  Outcome: Progressing  Goal: Patient-Specific Goal (Individualization)  Outcome: Progressing  Goal: Absence of Hospital-Acquired Illness or Injury  Outcome: Progressing  Goal: Optimal Comfort and Wellbeing  Outcome: Progressing  Goal: Readiness for Transition of Care  Outcome: Progressing  Goal: Rounds/Family Conference  Outcome: Progressing

## 2019-08-07 NOTE — Unmapped (Addendum)
PT VSS and afebrile. Pt refused labs in am. PIV c/d/i and saline locked. Pt had x1 BM and when he wiped there was blood. MD notified. Md came to bedside to assess. No PRNs given. No pain or nausea. Decreased appetite. Urine output dark amber.Pt went for his renal ultrasound. UA sent. No one at bedside. Will continue to monitor.       Problem: Adult Inpatient Plan of Care  Goal: Plan of Care Review  Outcome: Ongoing - Unchanged  Goal: Patient-Specific Goal (Individualization)  Outcome: Ongoing - Unchanged  Goal: Absence of Hospital-Acquired Illness or Injury  Outcome: Ongoing - Unchanged  Goal: Optimal Comfort and Wellbeing  Outcome: Ongoing - Unchanged  Goal: Readiness for Transition of Care  Outcome: Ongoing - Unchanged  Goal: Rounds/Family Conference  Outcome: Ongoing - Unchanged     Problem: Wound  Goal: Optimal Wound Healing  Outcome: Ongoing - Unchanged
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
## 2019-08-07 NOTE — Unmapped (Signed)
Afebrile. BP outside parameters x2 this shift, MD aware, otherwise VSS. S/P liver biopsy on 9/25. Dressing to incision site remains intact w/ scant old drainage on assessment. Pt reported no pain this shift, no PRNs given. IV fluids d/c'd per orders. Continue to encourage PO intake. UO adequate; urine remains amber. BM x2 this shift. No family at bedside; pt continues to allow no visitors. Pt remains free from falls and additional injury. WCM.       Problem: Adult Inpatient Plan of Care  Goal: Plan of Care Review  Outcome: Ongoing - Unchanged  Goal: Patient-Specific Goal (Individualization)  Outcome: Ongoing - Unchanged  Goal: Absence of Hospital-Acquired Illness or Injury  Outcome: Ongoing - Unchanged  Goal: Readiness for Transition of Care  Outcome: Ongoing - Unchanged  Goal: Rounds/Family Conference  Outcome: Ongoing - Unchanged     Problem: Adult Inpatient Plan of Care  Goal: Optimal Comfort and Wellbeing  Outcome: Progressing     Problem: Wound  Goal: Optimal Wound Healing  Outcome: Progressing

## 2019-08-08 LAB — MAGNESIUM: Magnesium:MCnc:Pt:Ser/Plas:Qn:: 2

## 2019-08-08 LAB — PHOSPHORUS
PHOSPHORUS: 5.1 mg/dL — ABNORMAL HIGH (ref 2.9–4.7)
Phosphate:MCnc:Pt:Ser/Plas:Qn:: 5.1 — ABNORMAL HIGH

## 2019-08-08 LAB — COMPREHENSIVE METABOLIC PANEL
ALBUMIN: 2.2 g/dL — ABNORMAL LOW (ref 3.5–5.0)
ALKALINE PHOSPHATASE: 1444 U/L — ABNORMAL HIGH (ref 65–260)
ALT (SGPT): 346 U/L — ABNORMAL HIGH (ref <50)
ANION GAP: 9 mmol/L (ref 7–15)
AST (SGOT): 268 U/L — ABNORMAL HIGH (ref 19–55)
BILIRUBIN TOTAL: 15 mg/dL — ABNORMAL HIGH (ref 0.0–1.2)
BLOOD UREA NITROGEN: 27 mg/dL — ABNORMAL HIGH (ref 7–21)
BUN / CREAT RATIO: 18
CALCIUM: 7.4 mg/dL — ABNORMAL LOW (ref 8.5–10.2)
CHLORIDE: 106 mmol/L (ref 98–107)
CO2: 22 mmol/L (ref 22.0–30.0)
CREATININE: 1.5 mg/dL — ABNORMAL HIGH (ref 0.70–1.30)
EGFR CKD-EPI AA MALE: 77 mL/min/{1.73_m2} (ref >=60)
EGFR CKD-EPI NON-AA MALE: 67 mL/min/{1.73_m2} (ref >=60)
GLUCOSE RANDOM: 177 mg/dL (ref 70–179)
POTASSIUM: 4.5 mmol/L (ref 3.5–5.0)

## 2019-08-08 LAB — AST (SGOT): Aspartate aminotransferase:CCnc:Pt:Ser/Plas:Qn:: 268 — ABNORMAL HIGH

## 2019-08-08 LAB — PROTIME: Coagulation tissue factor induced:Time:Pt:PPP:Qn:Coag: 15.3 — ABNORMAL HIGH

## 2019-08-08 LAB — TACROLIMUS, TROUGH: Lab: 11.9

## 2019-08-08 NOTE — Unmapped (Signed)
Pediatric Daily Progress Note     Assessment/Plan:     Principal Problem:    Liver transplant rejection (CMS-HCC)  Active Problems:    Recurrent major depressive disorder, in partial remission (CMS-HCC)    Malnutrition of mild degree (CMS-HCC)    History of liver transplant (CMS-HCC)    Sickle cell trait (CMS-HCC)    Transaminitis    Anemia    Hypoalbuminemia    Cholangitis of transplanted liver (CMS-HCC)    Abdominal pain    Melena    Homeless    Dark stools  Resolved Problems:    * No resolved hospital problems. 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 He??requires care in the hospital for transplant rejection protocol treatment.    # Hx Liver Transplant - Subacute/Acute Liver Failure:  Pt has biopsy confirmed severe acute rejection.     - GI/transplant following, appreciate recs   - Solumedrol 40mg  IV qd --> Continued 560 mg pulse dose today 9/28   - Mycophenolate 540mg  bid    - Tacrolimus trough 9/26 elevated. Reduced Tacrolimus 5mg  bid ->3mg  BID  - May require anti-thymoglobulin if no improvement on pulse dose steroid   - Liver biopsy completed 9/25; RAI 9/9  - 3x Vit K injection 10 mg. last dose 9/27. INR improved from 1.6 on admission to 1.3 (9/28)  - HIV negative   - CMV viral load not detected, EBV pending   - Vit D level pending  - MELD score 24 - estimated 3 month mortality of 19.6%  - PICC placement today     FEN/GI:??Abnormal electrolyte levels in setting of ongoing liver disease and recent kidney injury  - Monitor fluid status and daily weights closely. Low threshold for ab U/S to assess for ascites  - Avoid D5 fluids, as patient has developed diabetes in the past (bg 227 on night of 9/25)  -Zofran q8 hours as needed  -Protonix 40 mg daily  -Regular diet   -Daily CMP, Mg, Ph  -giving Neutrophos today  -giving Ergocalciferol   -providing ensures BID  -starting aquadex multivitamin    ??  # AKI:  - Creatinine trending upward throughout hospital stay. Cr 1.50 on 9/28 however appearing to plateau  - Etiology uncertain, possibly 2/2 Liver disease, TAC kidney, poor PO intake, hypotension during biopsy, etc.    - Peds nehprology following: notes that Rate of creatinine rise between yesterday and today is very reassuring. Creatinine will likely plateau in the next 48H and start getting better shortly thereafter   - Encouraging PO intake (>1.6L of fluids PO/day)   - STRICT I/Os      # Abdominal Pain, dark stools:  Unclear etiology, as FOBT x2 negative.  One possibility is recent use of bismuth-containing medications.      #Severe Malnutrition: ??Prior to admission, patient has been skipping meals due to poor appetite and food insecurity. He had not eaten for 2-4 days prior to arriving at [facility].   - Nutrition consult, appreciate recs  - Thiamine completed 9/26  - Multivitamin, Ca 300 mg BID,  vit D 1000u QD   - Monitor phosphorous level. Replenish with neutrophos again today  - Repeat EKG later in admission  ??  # Major Depressive Disorder: Stable. Has hx of attempted overdoses, with the last attempt in April 2019 at which point he was hospitalized in the Adolescent Psychiatry unit and discharged on Lexapro 20 mg, melatonin 9 mg.  He has not engaged with mental health resources or taken medication for depression or insomnia in 8-10 months.  Denies SI, passive suicidality, self-harm, or HI at this time.   - Psych consulted   - Melatonin nightly  - c/s supportive care   ??  # Homelessness - Social Concerns:??[name] has experienced homelessness for ~5 months when he left his mother's house.  He has severed all ties ties with his family and states his relationship with his mother cannot be reconciled.  He also requested contact information of family members to be erased from his chart (this has been completed, but social worker did drop contact info into her note on 9/24 in case he changes his mind during admission).  In addition to food insecurity, he has not been able to secure medications.  He is routinely exposed to alcohol and drugs (marijuana, cocaine, etc.) second-hand through his friendships though he denies current use himself.  On urine drug screen, he tested positive for marijuana only.  - Patient plans to be discharged to a local homeless shelter and begin job hunting  - SW will Chief Technology Officer for [employer] and homeless shelters in [place], [place], and [employer]:??PIV  ??  Discharge criteria:??liver transplant work-up and treatment of acute rejection      Subjective:     Interval History: No acute events overnight.     Objective:     Vital signs in last 24 hours:  Temp:  [36.6 ??C-36.9 ??C] 36.9 ??C  Heart Rate:  [72-84] 73  Resp:  [18] 18  BP: (104-122)/(52-69) 104/52  MAP (mmHg):  [70-85] 78  SpO2:  [99 %-100 %] 99 %  Intake/Output last 3 shifts:  I/O last 3 completed shifts:  In: 1170 [P.O.:1080; IV Piggyback:90]  Out: 1180 [Urine:1180]    Physical Exam:  Gen: Thin young male lying in bed in no acute distress  Eyes: Sclera icteric, conjunctivae non-injected.  ENT: External ear normal, mucous membranes moist  Heart: Regular rate and rhythm without murmurs, rubs, or gallops.  S1 and S2 normal. 2+ radial pulses   Pulmonary: Good air movement. Clear to auscultation bilaterally. Normal work of breathing  Skin: No rashes  Abd: Normal bowel sounds, soft, non-tender. Hepatomegaly with right upper quadrant tenderness on exam.   Ext: Warm and well perfused without cyanosis, clubbing, or edema.  Neuro: No focal findings    Active Medications reviewed and KEY Medications include:       Current Facility-Administered Medications:   ???  calcium carbonate (OS-CAL) tablet 300 mg elem calcium, 300 mg elem calcium, Oral, BID, Anastasia Pall, MD, 300 mg elem calcium at 08/08/19 1033  ???  diphenhydrAMINE (BENADRYL) capsule/tablet 50 mg, 50 mg, Oral, Nightly PRN, Audrie Lia, MD, 50 mg at 08/06/19 2230  ???  ergocalciferol (DRISDOL) capsule 50,000 Units, 50,000 Units, Oral, Weekly, Ejiofor A Lucky Cowboy., MD, 50,000 Units at 08/08/19 1214  ???  influenza vaccine quad (FLUARIX, FLULAVAL, FLUZONE) (6 MOS & UP) 2020-21, 0.5 mL, Intramuscular, During hospitalization, Anastasia Pall, MD  ???  melatonin tablet 3 mg, 3 mg, Oral, QPM, Ellyn Hack  A Dagher, MD, 3 mg at 08/07/19 2124  ???  multivitamin, with zinc (AQUADEKS) chewable tablet, 2 tablet, Oral, Daily, Ejiofor A Lucky Cowboy., MD  ???  mycophenolate (MYFORTIC) EC tablet 540 mg, 540 mg, Oral, BID, Anastasia Pall, MD, 540 mg at 08/08/19 1033  ???  pantoprazole (PROTONIX) EC tablet 40 mg, 40 mg, Oral, Daily, Almon Register, MD, 40 mg at 08/07/19 2124  ???  tacrolimus (PROGRAF) capsule 3 mg, 3 mg, Oral, BID, Almon Register, MD, 3 mg at 08/08/19 1032        Studies: Personally reviewed and interpreted.  Labs/Studies:  Labs and Studies from the last 24hrs per EMR and Reviewed and   All lab results last 24 hours:    Recent Results (from the past 24 hour(s))   Urinalysis    Collection Time: 08/07/19  5:47 PM   Result Value Ref Range    Color, UA Amber     Clarity, UA Clear     Specific Gravity, UA 1.016 1.003 - 1.030    pH, UA 5.0 5.0 - 9.0    Leukocyte Esterase, UA Negative Negative    Nitrite, UA Negative Negative    Protein, UA Negative Negative    Glucose, UA Negative Negative    Ketones, UA Negative Negative    Urobilinogen, UA 4.0 mg/dL (A) 0.2 mg/dL, 1.0 mg/dL    Bilirubin, UA Large (A) Negative    Blood, UA Negative Negative    RBC, UA <1 <=3 /HPF    WBC, UA 3 (H) <=2 /HPF    Squam Epithel, UA 1 0 - 5 /HPF    Bacteria, UA Rare (A) None Seen /HPF    Amorphous Crystal, UA Rare /HPF    Mucus, UA Rare (A) None Seen /HPF   Sodium, Random Urine    Collection Time: 08/07/19  6:55 PM   Result Value Ref Range    Sodium, Ur 62 Undefined mmol/L   Creatinine, Urine    Collection Time: 08/07/19  6:55 PM   Result Value Ref Range    Creat U 77.2 Undefined mg/dL   Urea Nitrogen, Random Urine    Collection Time: 08/07/19  6:55 PM   Result Value Ref Range    Urea Nitrogen, Ur 560 Undefined mg/dL   POCT Glucose    Collection Time: 08/07/19  7:36 PM   Result Value Ref Range    Glucose, POC 162 70 - 179 mg/dL   POCT Glucose    Collection Time: 08/07/19 11:12 PM   Result Value Ref Range    Glucose, POC 147 70 - 179 mg/dL   Comprehensive Metabolic Panel    Collection Time: 08/08/19  6:36 AM   Result Value Ref Range    Sodium 137 135 - 145 mmol/L    Potassium 4.5 3.5 - 5.0 mmol/L    Chloride 106 98 - 107 mmol/L    Anion Gap 9 7 - 15 mmol/L    CO2 22.0 22.0 - 30.0 mmol/L    BUN 27 (H) 7 - 21 mg/dL    Creatinine 9.81 (H) 0.70 - 1.30 mg/dL    BUN/Creatinine Ratio 18     EGFR CKD-EPI Non-African American, Male 66 >=60 mL/min/1.34m2    EGFR CKD-EPI African American, Male 66 >=60 mL/min/1.42m2    Glucose 177 70 - 179 mg/dL    Calcium 7.4 (L) 8.5 - 10.2 mg/dL    Albumin 2.2 (L) 3.5 - 5.0 g/dL    Total Protein 8.0 6.5 -  8.3 g/dL    Total Bilirubin 46.9 (H) 0.0 - 1.2 mg/dL    AST 629 (H) 19 - 55 U/L    ALT 346 (H) <50 U/L    Alkaline Phosphatase 1,444 (H) 65 - 260 U/L   Magnesium Level    Collection Time: 08/08/19  6:36 AM   Result Value Ref Range    Magnesium 2.0 1.6 - 2.2 mg/dL   Phosphorus Level    Collection Time: 08/08/19  6:36 AM   Result Value Ref Range    Phosphorus 5.1 (H) 2.9 - 4.7 mg/dL   PT-INR    Collection Time: 08/08/19  6:36 AM   Result Value Ref Range    PT 15.3 (H) 10.2 - 13.1 sec    INR 1.32    POCT Glucose    Collection Time: 08/08/19 10:04 AM   Result Value Ref Range    Glucose, POC 125 70 - 179 mg/dL     ========================================  Val Eagle, MD  Anesthesiology, PGY-1  [phone]      I supervised the resident physician on subsequent day care who spent at least 35 minutes on the floor or unit in direct patient care. The direct patient care time included face-to-face time with the patient, reviewing the patient's chart, communicating with the family and/or other professionals and coordinating care. Greater than 50% of this time was spent in counseling or coordinating care with the patient regarding treatment of liver rejection complicated by acute liver injury and electrolyte abnormalities. PICC placement today to help facilitate blood draws in light of on going need for daily blood work. I was available throughout care provided.     Pablo Ledger MD, MPH, MMSc.  Attending Physician; Peds GI/Hepatology/Nutrition  [facility]

## 2019-08-08 NOTE — Unmapped (Addendum)
VSS. Remain on RA. PIV SL. Tacrolimus trough was drawn prior to dose given. IV solumedrol was given. Eating well. The team spoke to patient about drinking 2 grey containers of oral fluid to help with his kidneys. Patient agreed. PICC line was placed and ready to used. Patient vomit after the procedure (4mg  zofran). Patient stated that when he stools (9/27) that he see some blood on tissue the team aware when they came to room today. Patient stated that it was improving when the team was present. WCTM.  Problem: Adult Inpatient Plan of Care  Goal: Plan of Care Review  Outcome: Progressing  Goal: Patient-Specific Goal (Individualization)  Outcome: Progressing  Goal: Absence of Hospital-Acquired Illness or Injury  Outcome: Progressing  Goal: Optimal Comfort and Wellbeing  Outcome: Progressing  Goal: Readiness for Transition of Care  Outcome: Progressing  Goal: Rounds/Family Conference  Outcome: Progressing     Problem: Wound  Goal: Optimal Wound Healing  Outcome: Progressing

## 2019-08-08 NOTE — Unmapped (Signed)
[place]' VSS, afebrile. PIV c/d/I, saline locked. No complaints of pain. Sclera remains yellow. Blood sugar checks WNL. Poor PO  intake. Drinking some. No BM this shift. No one present at bedside. Will continue to monitor.         Problem: Adult Inpatient Plan of Care  Goal: Plan of Care Review  Outcome: Ongoing - Unchanged  Goal: Patient-Specific Goal (Individualization)  Outcome: Ongoing - Unchanged  Goal: Absence of Hospital-Acquired Illness or Injury  Outcome: Ongoing - Unchanged  Goal: Optimal Comfort and Wellbeing  Outcome: Ongoing - Unchanged  Goal: Readiness for Transition of Care  Outcome: Ongoing - Unchanged  Goal: Rounds/Family Conference  Outcome: Ongoing - Unchanged     Problem: Wound  Goal: Optimal Wound Healing  Outcome: Ongoing - Unchanged

## 2019-08-08 NOTE — Unmapped (Signed)
Rate of creatinine rise between yesterday and today is very reassuring. Creatinine will likely plateau in the next 48H and start getting better shortly thereafter.

## 2019-08-08 NOTE — Unmapped (Addendum)
PICC LINE INSERTION PROCEDURE NOTE    Indications:  Poor Vascular Access and Long Term Therapy    Consent/Time Out:    Risks, benefits and alternatives discussed with patient.  Written consent was obtained prior to the procedure and is detailed in the medical record.  Prior to the start of the procedure, a time out was taken and the identity of the patient was confirmed via name, medical record number and  date of birth. The availability of the correct equipment was verified.    Procedure Details:  The vein was identified using ultrasound guidance and measured for appropriate catheter length. Maximum sterile techniques was utilized.  Sterile field prepared with necessary supplies and equipment. Insertion site was prepped with chlorhexidine solution and allowed to dry. Vessel accessed using steel introducer, guidewire inserted without difficulty. Lidocaine 2 mL subcutaneously and intradermally administered to insertion site. Small incision made using scalpel. Vein dilated using modified seldinger technique. The catheter was primed with normal saline.  A 4 FR Single lumen was inserted to the R Basilic vein with 2 insertion attempt(s).  Catheter aspirated, blood return present. 3CG technology used. Peaked p-waves obtained. The catheter was then flushed with 20 mL of normal saline.   Insertion site cleansed, and sterile CHG dressing applied per manufacturer guidelines and labeled with date, time, and initials. Heparin, 20 units instilled into line. The Central Line Checklist was referenced.  The catheter was inserted without difficulty by Serena Croissant RN and assisted by Herschell Dimes RN. Procedure performed in Procedure Room. Anxiolysis given. Pt tolerated procedure very well.       Findings:  Manufacturer:  Bard  Lot #:  [id number]  CT Injectable (power):  yes  Arm Circumference:  23  cm  Total catheter length:  35 cm.    External length:  0 cm.  Catheter trimmed:  Yes         Vein Size:   Right arm true basilic vein compressible: yes , measurement: 3.85mm with 35% occupancy of vessel.                             Tip Placement Verification:   Chest Xray confirmed at SVC/right atrial junction.    Workup Time:    30 minutes    Recommendations:  Chest x-ray ordered to verify placement. Primary team and primary RN made aware PICC is ok for immediate use.      See vein image below: (above)

## 2019-08-09 LAB — COMPREHENSIVE METABOLIC PANEL
ALBUMIN: 2.1 g/dL — ABNORMAL LOW (ref 3.5–5.0)
ALKALINE PHOSPHATASE: 1286 U/L — ABNORMAL HIGH (ref 65–260)
ALT (SGPT): 351 U/L — ABNORMAL HIGH (ref <50)
ANION GAP: 3 mmol/L — ABNORMAL LOW (ref 7–15)
AST (SGOT): 303 U/L — ABNORMAL HIGH (ref 19–55)
BILIRUBIN TOTAL: 14.9 mg/dL — ABNORMAL HIGH (ref 0.0–1.2)
BLOOD UREA NITROGEN: 25 mg/dL — ABNORMAL HIGH (ref 7–21)
BUN / CREAT RATIO: 19
CALCIUM: 7.3 mg/dL — ABNORMAL LOW (ref 8.5–10.2)
CHLORIDE: 110 mmol/L — ABNORMAL HIGH (ref 98–107)
CO2: 23 mmol/L (ref 22.0–30.0)
CREATININE: 1.29 mg/dL (ref 0.70–1.30)
EGFR CKD-EPI AA MALE: >90 mL/min/{1.73_m2} (ref >=60)
EGFR CKD-EPI NON-AA MALE: 80 mL/min/{1.73_m2} (ref >=60)
POTASSIUM: 4.7 mmol/L (ref 3.5–5.0)
PROTEIN TOTAL: 7.5 g/dL (ref 6.5–8.3)
SODIUM: 136 mmol/L (ref 135–145)

## 2019-08-09 LAB — MAGNESIUM: Magnesium:MCnc:Pt:Ser/Plas:Qn:: 2.1

## 2019-08-09 LAB — PHOSPHORUS: Phosphate:MCnc:Pt:Ser/Plas:Qn:: 3.3

## 2019-08-09 LAB — ALKALINE PHOSPHATASE: Alkaline phosphatase:CCnc:Pt:Ser/Plas:Qn:: 1286 — ABNORMAL HIGH

## 2019-08-09 NOTE — Unmapped (Addendum)
Pt spent most of the day quiet and in bed. VSS and BS remained in normal range. PICC line in R arm, PIV in L arm, dressings dry and intact. Pt remains on room air with crackles in bases bilaterally. Abdomen rounded on assessment with pt complaints of bloating. Pt educated on incentive spirometer. At 1450, pt complained of nausea (PMG notified) and later appeared tremulous with second episode of nausea. PRN Zofran administered. Started on Pepcid r/t IV Solumedrol. Pt seen my supportive care team.  Abdominal ultrasound done. Around 1700, patient was having chills and started that he was cold. Checked temp 36.9 oral (PMG intern was Dr. Belva Chimes was notified). Benadryl x 1 for itching (night nurse will notify MD about changing benadryl dose prior to solumedrol due to patient requests)   Encouraged pt to drink 1600 mLs of fluid and ambulate in room. WCTM.  Problem: Adult Inpatient Plan of Care  Goal: Plan of Care Review  Outcome: Progressing  Goal: Patient-Specific Goal (Individualization)  Outcome: Progressing  Goal: Absence of Hospital-Acquired Illness or Injury  Outcome: Progressing  Goal: Optimal Comfort and Wellbeing  Outcome: Progressing  Goal: Readiness for Transition of Care  Outcome: Progressing  Goal: Rounds/Family Conference  Outcome: Progressing     Problem: Wound  Goal: Optimal Wound Healing  Outcome: Progressing

## 2019-08-09 NOTE — Unmapped (Addendum)
Pediatric Palliative Care Consult Note:    [name] Gross is a 18 y.o. male seen for pediatric palliative care consultation at the request of Dr. Curtis Sites for goals of care, advance care planning and family support    Primary Care Provider: [name], MD    History provided by: patient    Assessment:    [name] Gross is a 18 y.o. 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 His hospital course has been complicated by AKI, however this has appeared to start to improve today. His liver function is not yet responding to the transplant rejection treatment, and his current MELD score of 23 is concerning with associated ~20% mortality over 90 days. Palliative care consult requested for goals of care, advance care planning and family support.  [name] Gross has limited social supports, and has cut himself off from most of his family. We discussed the importance of him naming a surrogate decision maker, in the event he cannot make decisions for himself.  This is particularly pressing given his clinical status.  He would benefit from family meeting to discuss prognosis and plan of care.     Summary of Discussion and Recommendations:     1. SYMPTOMS:   - Introduced role of our team in assisting with symptom management.   - Nausea: currently on scheduled Protonix 40mg  daily, Pepcid 20mg  BID also added today, Zofran PRN   - Insomnia: Currently on scheduled melatonin nightly, also provided Filimon with ear plugs to help drown out sounds from babies crying in rooms next to his room.  - Depression: Psych following, history of suicide attempt in April 2019, denied SI to other providers during this hospital stay.   - Episodes of disorientation/difficulty with memory: concerning as possible sign of hepatic encephalopathy, however may also be symptom of severe depression. Consider discussing further with psych team and continue to monitor his mental status.      2. GOALS:   - Discussed goals of care, aligning medical decisions with care goals.   - [name] Gross hopes to make a recovery, find housing, and find a job. He is particularly interested in help finding housing in the area.   - [name] Gross has long term goals which include going back to school and becoming a Runner, broadcasting/film/video or nurse.     3. DECISIONS:   - Discussed role of our team in supporting children and families in treatment decision-making.  - Given risk of hepatic encephalopathy, and risk of his condition deteriorating, recommended Levon think about who he would want to be his surrogate decision maker if he was unable to make decisions for himself (see ACP below)   - [name] Gross likes to receive information directly. He would like to know more about what to expect if his liver does not respond to the current treatment for rejection.      4. ADVANCE CARE PLANNING  - Introduced concept of advance care planning, provided Voicing My Choices booklet.  - Recommended [name] Gross think about who he would want to be his surrogate decision maker if he was unable to make decisions for himself, informed him that if he did not, then legally his parents would be his next of kin. [name] Gross will think about this further and we will revisit.   - Code status: full code, not discussed today  - Prognosis: Guarded,  his current MELD score of 23 has an estimated 3 month mortality rate of ~20%  - Primary team mentioned that he may not be a candidate for another transplant given his history of a suicide attempt in the past, if this is true recommend that this information be shared with [name]. He would like to know what to expect if he does not recover from this rejection episode.     5. SUPPORT:   - [name] Gross describes limited personal supports including an aunt and maternal grandparents, and friends. He has not spoken to any friends/family since being admitted to the hospital. He has chosen to leave his phone off but is open to turning it back on soon.   - Discussed hospital based resources.    6. CARE COORDINATION:   - Care coordinated with primary team, nursing, and child life.       Total time spent with patient for evaluation & management (excluding ACP documented separately): 90 minutes (2-3:30).  Greater than 50% of this time spent on counseling/coordination of care: Yes.  See ACP Note from today for additional billable service: No, 0 minutes.        We appreciate the consult. The Children's Supportive Care Team can be reached by pager [name]) or email ([email or web address]).       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 His hospital course has been complicated by AKI, although this is improving today. His liver function has not yet responded to pulse dose steroids, primary team concerned about overall prognosis.     Today, [name] reports that what brought him to the hospital was initially some dark-colored stools.  He was concerned that his liver function was worsening when this happened.  He presented to an outside ED.  He notes that he did advocate for himself and told the OSH ED that he would like to be transferred to [facility].  At [facility] there was concern for acute liver failure due to rejection, and he was started on high dose steroids.    [name] Gross describes being very good about taking his liver transplant medications until a few months ago.  For a long time he had a very good organization schedule for taking his medicines at the same time.  However, recently he notes that he has fallen off of his schedule for taking his medications on time. This may be related in part to his struggles with homelessness.  He was most recently living with a friend in [place] prior to admission.     He reports that the primary reason he cut himself off from his family/moved out was that his family was too overbearing. He notes that in particular his mom and paternal grandmother were sheltering him/excessively overbearing after he received his transplant.  He felt like he was put in lockdown.  And he notes that he got very angry about being so sheltered.  He says that he reclaimed his freedom at age 21 when he decided to move out of the house.  However this has led to periods of homelessness and difficulties with food security.    Prior to this hospital stay he was still in contact with his aunt and his maternal grandparents.  He has been upset that his sister notified the rest  of his family that he was in the hospital during this admission.  He did not want the rest of his family to know that he was in the hospital and he especially did not want that someone else to be the wanted notified them.  Because of this he has kept his phone off for most of this hospital stay and has not been in contact with outside family and friends.     In terms of symptoms during this hospitalization, he reports that since he started the steroids he has had an upset stomach and some nausea.  When he is not having nausea, he reports that his appetite is just okay.  He is also bothered by the swelling in his stomach and in his feet.  Itching has been fairly well managed with Benadryl.  He has some issues falling asleep but is currently on scheduled melatonin.  However, he notes that the babies crying in the rooms nearby are also bothersome to him when he is trying to sleep at night.  He reports that he primarily gets through tough times by just putting his head down and getting through them.  Unable to discuss many coping mechanisms with our team today.  He feels as though he has significant fatigue although does have small bursts of energy.  He did mention that sometimes he feels like he is mentally here but mentally not here. For example he states that if he were to get up and do something right now he may not remember doing that thing. These episodes can be scary, and they have been happening for about a month.  He had not previously told his primary team that he is having the symptoms.  He told our team it was okay to pass along this information to his primary team.    He was able to tell our team that he is currently hospitalized due to issues with his liver, and liver rejection.  Also notes that there are problems there have been concerns with his kidney function.  And that team needs to monitor his blood work closely.  However he is not sure what to expect, and would like to learn more about what to expect if current treatments are not successful for this current rejection episode.  He has hopes for the future.  He hopes that his hospitalization will lead to a full recovery.  He is also interested in obtaining permanent housing.  He also would like to get a job.  Long-term he is interested in going back to school, thinking about becoming a Engineer, maintenance.    Past Medical History:   Past Medical History:   Diagnosis Date   ??? 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 Depression    ??? Seasonal allergies    ??? Sickle cell trait (CMS-HCC)        Past Surgical History:   Procedure Laterality Date   ??? FRENULECTOMY, LINGUAL     ??? PR ERCP REMOVE FOREIGN BODY/STENT BILIARY/PANC DUCT N/A 08/20/2017    Procedure: ENDOSCOPIC RETROGRADE CHOLANGIOPANCREATOGRAPHY (ERCP); W/ REMOVAL OF FOREIGN BODY/STENT FROM BILIARY/PANCREATIC DUCT(S);  Surgeon: Vonda Antigua, MD;  Location: GI PROC[facility]URES [facility];  Service: Gastroenterology   ??? PR ERCP REMOVE FOREIGN BODY/STENT BILIARY/PANC DUCT N/A 06/04/2018    Procedure: ENDOSCOPIC RETROGRADE CHOLANGIOPANCREATOGRAPHY (ERCP); W/ REMOVAL OF  FOREIGN BODY/STENT FROM BILIARY/PANCREATIC DUCT(S);  Surgeon: Chriss Driver, MD;  Location: GI PROC[facility]URES [facility];  Service: Gastroenterology   ??? PR ERCP STENT PLACEMENT BILIARY/PANCREATIC DUCT N/A 11/21/2016    Procedure: ENDOSCOPIC RETROGRADE CHOLANGIOPANCREATOGRAPHY (ERCP); WITH PLACEMENT OF ENDOSCOPIC STENT INTO BILIARY OR PANCREATIC DUCT;  Surgeon: Vonda Antigua, MD;  Location: GI PROC[facility]URES [facility];  Service: Gastroenterology   ??? PR ERCP,W/REMOVAL STONE,BIL/PANCR DUCTS N/A 08/20/2017    Procedure: ERCP; W/ENDOSCOPIC RETROGRADE REMOVAL OF CALCULUS/CALCULI FROM BILIARY &/OR PANCREATIC DUCTS;  Surgeon: Vonda Antigua, MD;  Location: [facility]URES [facility];  Service: Gastroenterology   ??? PR ERCP,W/REMOVAL STONE,BIL/PANCR DUCTS N/A 06/04/2018    Procedure: ERCP; W/ENDOSCOPIC RETROGRADE REMOVAL OF CALCULUS/CALCULI FROM BILIARY &/OR PANCREATIC DUCTS;  Surgeon: Chriss Driver, MD;  Location: GI [facility]URES [facility];  Service: Gastroenterology   ??? PR TRANSPLANT LIVER,ALLOTRANSPLANT N/A 09/22/2016    Procedure: LIVER ALLOTRANSPLANTATION; ORTHOTOPIC, PARTIAL OR WHOLE, FROM CADAVER OR LIVING DONOR, ANY AGE;  Surgeon: Doyce Loose, MD;  Location: MAIN OR [facility];  Service: Transplant   ??? PR TRANSPLANT,PREP DONOR LIVER, WHOLE N/A 09/22/2016    Procedure: [facility] STD PREP CAD DONOR WHOLE LIVER GFT PRIOR TNSPLNT,INC CHOLE,DISS/REM SURR TISSU WO TRISEG/LOBE SPLT;  Surgeon: Doyce Loose, MD;  Location: MAIN OR [facility];  Service: Transplant   ??? PR UPGI ENDOSCOPY W/US FN BX N/A 08/20/2017    Procedure: UGI W/ TRANSENDOSCOPIC ULTRASOUND GUIDED INTRAMURAL/TRANSMURAL FINE NEEDLE ASPIRATION/BIOPSY(S), ESOPHAGUS;  Surgeon: Vonda Antigua, MD;  Location: GI PROC[facility]URES [facility];  Service: Gastroenterology   ??? PR UPPER GI ENDOSCOPY,BIOPSY N/A 07/15/2016    Procedure: UGI ENDOSCOPY; WITH BIOPSY, SINGLE OR MULTIPLE;  Surgeon: Arnold Long Mir, MD;  Location: PEDS PROCEDURE ROOM [facility];  Service: Gastroenterology   ??? PR UPPER GI ENDOSCOPY,CTRL BLEED Left 05/05/2016    Procedure: UGI ENDOSCOPY; WITH CONTROL OF BLEEDING, ANY METHOD;  Surgeon: Arnold Long Mir, MD;  Location: PEDS PROCEDURE ROOM [facility];  Service: Gastroenterology   ??? PR UPPER GI ENDOSCOPY,CTRL BLEED N/A 05/12/2016    Procedure: UGI ENDOSCOPY; WITH CONTROL OF BLEEDING, ANY METHOD;  Surgeon: Annie Paras, MD;  Location: CHILDRENS OR [facility];  Service: Gastroenterology         Family History:   Family History   Problem Relation Age of Onset   ??? Diabetes Maternal Grandmother    ??? Diabetes Paternal Grandmother    ??? Hypertension Maternal Grandfather    ??? Hypertension Paternal Grandfather    ??? Asthma Brother    ??? Anesthesia problems Neg Hx        Social History: He has had struggles with homelessness, he would like help finding housing.  Most recently has been living with different friends. He has cut himself off from most of his family. Still in contact with 1 aunt and maternal grandparents.  Not in contact with his parents.  Currently unemployed but is interested in getting a job. He graduated high school. He is interested in going back to school, considering becoming a Runner, broadcasting/film/video or nurse.    Allergies:  Bee pollen and Pollen extracts    Medications:  Scheduled Meds:  ??? calcium carbonate  300 mg elem calcium Oral BID   ??? ergocalciferol  50,000 Units Oral Weekly   ??? famotidine  20 mg Oral BID   ??? heparin, porcine (PF)       ??? flu vacc [id number] 6mos up(PF)  0.5 mL Intramuscular During hospitalization   ??? melatonin  3 mg Oral QPM   ???  multivitamin, with zinc  2 tablet Oral Daily   ??? mycophenolate  540 mg Oral BID   ??? pantoprazole  40 mg Oral Daily   ??? tacrolimus  1 mg Oral BID     Continuous Infusions:  PRN Meds:.diphenhydrAMINE, heparin, porcine (PF)    ROS: +nausea, +itching, +insomnia, +fatigue, +depressed appetite, +LE edema, +abdominal swelling       Physical Exam:   Temp:  [35.3 ??C-36.7 ??C] 36.7 ??C  Heart Rate:  [72-84] 73  SpO2 Pulse:  [79] 79  Resp:  [16-18] 16  SpO2:  [100 %] 100 %  BP: (107-116)/(50-78) 107/50  MAP (mmHg):  [67-90] 67     Gen: Laying in bed, initially resting comfortably, later had some nausea and retching.   Skin: warm and dry.  Resp: Comfortable WOB on RA.  EXT: B/l pedal edema to just above ankles.   Psych: Flat affect, mental status appeared to be appropriate for age.      Data: Reviewed test results in Epic    Harold Hedge, MD, MPH  PGY-2 Pediatrics    Patient seen with   Jennette Dubin, DNP, CPNP  Children's Supportive Care Team  [phone] (pager)      Collaborating Physician Attestation    I was the supervising physician in the delivery of the service. I discussed Dijon's case with [facility], CPNP and was involved with making the recommendations contained in this note.     Waldon Reining, MD, MPH  Attending Physician, Children's Supportive Care Team

## 2019-08-09 NOTE — Unmapped (Signed)
Pediatric Tacrolimus Therapeutic Monitoring Pharmacy Note    [name] Gross is a 18 y.o. male continuing tacrolimus.     Indication: Liver transplant     Date of Transplant: 09/22/2016      Prior Dosing Information: Tacrolimus capsules 3 mg (0.05 mg/kg/DOSE) PO every 12 hours    Dosing Weight: 55.9 kg    Goals:  Therapeutic Drug Levels  Tacrolimus trough goal: 5-7 ng/mL    Additional Clinical Monitoring/Outcomes  ?? Monitor renal function (SCr and urine output) and liver function (LFTs)  ?? Monitor for signs/symptoms of adverse events (e.g., hyperglycemia, hyperkalemia, hypomagnesemia, hypertension, headache, tremor)    Results:   Tacrolimus level: 11.9 ng/mL, drawn late (~12.78 hours post-dose). Level obtained today is above the desired range of 5-7 ng/mL. This is likely due to drug accumulation/reduced clearance due to liver dysfunction.    Longitudinal Dose Monitoring:  Date Dose (mg)  AM / PM AM Scr (mg/dL) Level  (ng/mL) Key Drug Interactions   08/08/19 3 mg / HOLD 1.5 11.9 None   08/06/19 5 mg / 3 mg 0.8 9 None     Pharmacokinetic Considerations and Significant Drug Interactions:  ? Concurrent hepatotoxic medications: None identified  ? Concurrent CYP3A4 substrates/inhibitors: None identified  ? Concurrent nephrotoxic medications: None identified    Assessment/Plan:  Recommendedation(s)  ?? HOLD tacrolimus dose tonight (08/08/19)  ?? Restart tacrolimus on 08/09/19 at a reduced dose of 1 mg (0.017 mg/kg/DOSE) PO every 12 hours  ?? Obtain tacrolimus level after 2-3 doses to ensure level is trending down     Follow-up  ? Next level ordered: 08/10/19 at 1000  ? A pharmacist will continue to monitor and recommend levels as appropriate    The above plan was discussed and approved by Gildardo Griffes, MD    Please page service pharmacist with questions/clarifications.    Aura Fey, PharmD  Pediatric Clinical Pharmacist  [phone] (pager)  [phone] (pediatric pharmacy)

## 2019-08-09 NOTE — Unmapped (Signed)
[name] Gross' VSS, afebrile. PIV c/d/I, saline locked. PICC c/d/I, labs drawn per order, heparin locked. Pt eating some, drinking more than last night. Blood sugar checks in 180s. No complaints of pain this shift, no PRNs given. Will continue to monitor.         Problem: Adult Inpatient Plan of Care  Goal: Plan of Care Review  Outcome: Progressing  Goal: Patient-Specific Goal (Individualization)  Outcome: Progressing  Goal: Absence of Hospital-Acquired Illness or Injury  Outcome: Progressing  Goal: Optimal Comfort and Wellbeing  Outcome: Progressing  Goal: Readiness for Transition of Care  Outcome: Progressing  Goal: Rounds/Family Conference  Outcome: Progressing     Problem: Wound  Goal: Optimal Wound Healing  Outcome: Progressing

## 2019-08-10 LAB — COMPREHENSIVE METABOLIC PANEL
ALBUMIN: 2.4 g/dL — ABNORMAL LOW (ref 3.5–5.0)
ALKALINE PHOSPHATASE: 1235 U/L — ABNORMAL HIGH (ref 65–260)
ANION GAP: 9 mmol/L (ref 7–15)
AST (SGOT): 439 U/L — ABNORMAL HIGH (ref 19–55)
BILIRUBIN TOTAL: 17.1 mg/dL — ABNORMAL HIGH (ref 0.0–1.2)
BLOOD UREA NITROGEN: 29 mg/dL — ABNORMAL HIGH (ref 7–21)
BUN / CREAT RATIO: 21
CALCIUM: 7.8 mg/dL — ABNORMAL LOW (ref 8.5–10.2)
CHLORIDE: 108 mmol/L — ABNORMAL HIGH (ref 98–107)
CO2: 22 mmol/L (ref 22.0–30.0)
CREATININE: 1.4 mg/dL — ABNORMAL HIGH (ref 0.70–1.30)
EGFR CKD-EPI AA MALE: 84 mL/min/{1.73_m2} (ref >=60)
EGFR CKD-EPI NON-AA MALE: 72 mL/min/{1.73_m2} (ref >=60)
GLUCOSE RANDOM: 184 mg/dL — ABNORMAL HIGH (ref 70–179)
POTASSIUM: 5.1 mmol/L — ABNORMAL HIGH (ref 3.5–5.0)
PROTEIN TOTAL: 8.4 g/dL — ABNORMAL HIGH (ref 6.5–8.3)
SODIUM: 139 mmol/L (ref 135–145)

## 2019-08-10 LAB — URINALYSIS
BLOOD UA: NEGATIVE
GLUCOSE UA: NEGATIVE
KETONES UA: NEGATIVE
LEUKOCYTE ESTERASE UA: NEGATIVE
NITRITE UA: NEGATIVE
PH UA: 5 (ref 5.0–9.0)
PROTEIN UA: NEGATIVE
RBC UA: <1 /HPF (ref <=3)
SQUAMOUS EPITHELIAL: <1 /HPF (ref 0–5)
UROBILINOGEN UA: 4 — AB
WBC UA: 2 /HPF (ref <=2)

## 2019-08-10 LAB — UREA NITROGEN URINE: Lab: 927

## 2019-08-10 LAB — CREATININE, URINE: Lab: 73.6

## 2019-08-10 LAB — PHOSPHORUS: Phosphate:MCnc:Pt:Ser/Plas:Qn:: 4.3

## 2019-08-10 LAB — EGFR CKD-EPI NON-AA MALE: Lab: 72

## 2019-08-10 LAB — BACTERIA

## 2019-08-10 LAB — TACROLIMUS, TROUGH: Lab: 10.5

## 2019-08-10 LAB — SODIUM, URINE, RANDOM: SODIUM URINE: 45 mmol/L

## 2019-08-10 LAB — SODIUM URINE: Lab: 45

## 2019-08-10 LAB — AMMONIA
AMMONIA: 31 umol/L (ref 9–33)
Ammonia:SCnc:Pt:Plas:Qn:: 31

## 2019-08-10 LAB — MAGNESIUM
MAGNESIUM: 2.3 mg/dL — ABNORMAL HIGH (ref 1.6–2.2)
Magnesium:MCnc:Pt:Ser/Plas:Qn:: 2.3 — ABNORMAL HIGH

## 2019-08-10 NOTE — Unmapped (Signed)
[name]' VSS, afebrile. PICC c/d/I, capped per report. PIV c/d/I, saline locked. Pt's abdomen distended and bilateral feet slightly edematous. Drinking and eating some, encouraging pt to drink more. No complaints of pain or nausea. No one at bedside. Mom called unit but pt did not want to talk. Will continue to monitor.         Problem: Adult Inpatient Plan of Care  Goal: Plan of Care Review  Outcome: Ongoing - Unchanged  Goal: Patient-Specific Goal (Individualization)  Outcome: Ongoing - Unchanged  Goal: Absence of Hospital-Acquired Illness or Injury  Outcome: Ongoing - Unchanged  Goal: Optimal Comfort and Wellbeing  Outcome: Ongoing - Unchanged  Goal: Readiness for Transition of Care  Outcome: Ongoing - Unchanged  Goal: Rounds/Family Conference  Outcome: Ongoing - Unchanged     Problem: Wound  Goal: Optimal Wound Healing  Outcome: Ongoing - Unchanged

## 2019-08-10 NOTE — Unmapped (Signed)
Pediatric Palliative Care Consult Note:  ??  [name] Gross is a 18 y.o. male seen for pediatric palliative care consultation at the request of Dr. Curtis Sites for goals of care, advance care planning and family support  ??  Primary Care Provider: [name], MD  ??  History provided by: patient  ??  Assessment:  ??  [name] Gross is a 18 y.o. 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 His hospital course has been complicated by AKI, initially thought to be improving, however Cr was worsening today. His liver function is not yet responding to the high dose steroids, and his MELD score of 23 is concerning with its associated ~20% mortality over 90 days. Palliative care consult requested for goals of care, advance care planning and family support.  [name] Gross has limited social supports, and has cut himself off from most of his family. We have discussed the importance of him naming a surrogate decision maker, in the event he cannot make decisions for himself.  This is particularly pressing given his clinical status.  He would benefit from family meeting to discuss prognosis and plan of care. Ideally his nausea would be under better control prior to this meeting. We also suggested [name] Gross think about having a support person at a meeting with his providers.   ??  Summary of Discussion and Recommendations:   ??  1. SYMPTOMS:   - Introduced role of our team in assisting with symptom management.   - Nausea: currently on scheduled Protonix 40mg  daily, Pepcid 20mg  BID, Zofran 4mg  Q8hr PRN, if ok with primary team, consider trial of low dose phenergan (6.25mg  IV Q6 hours PRN) - he is nauseated but hungry and hopes to feel well enough to eat something    - Insomnia: Scheduled melatonin nightly, also provided [name] Gross with ear plugs to help drown out sounds from babies crying in rooms next to his room.  - Depression: Psych following, history of suicide attempt in Apirl 2019, denied SI to other providers during this hospital stay.   - Episodes of disorientation/difficulty with memory: concerning as possible sign of hepatic encephalopathy, however may also be symptom of severe depression. Consider discussing further with psych team and continue to monitor his mental status.    ??  2. GOALS:   - Discussed goals of care, aligning medical decisions with care goals.   - [name] Gross hopes to make a recovery, find housing, and find a job. He is particularly interested in help finding housing in the area.   - [name] Gross has long term goals which include going back to school and becoming a Runner, broadcasting/film/video or nurse.   ??  3. DECISIONS:   - Given risk of hepatic encephalopathy, and risk of his condition deteriorating, recommended [name] Gross think about who he would want to be his surrogate decision maker if he was unable to make decisions for himself (see ACP below)   - [name] Gross likes to receive information directly. He would like to know more about what to expect if his liver does not respond to the current treatment for rejection.    - Recommend family meeting once his nausea is under better control   ??  4. ADVANCE CARE PLANNING  - Introduced concept of advance care planning, provided Voicing My Choices booklet at initial visit  - Recommended [place] think about  who he would want to be his surrogate decision maker if he was unable to make decisions for himself, informed him that if he did not, then legally his parents would be his next of kin. [name] Gross will think about this further and we will revisit.   - Code status: full code, not discussed today  - Prognosis: Guarded, his current MELD score of 23 has an estimated 3 month mortality rate of ~20%  - Primary team mentioned that he may not be a candidate for another transplant given his history of a suicide attempt in the past, if this is true recommend that this information be shared with [name] Gross. He would like to know what to expect if he does not recover from this rejection episode.   ??  5. SUPPORT:   - [name] Gross describes limited personal supports including an aunt and maternal grandparents, and friends. He has not spoken to any friends/family since being admitted to the hospital. He has chosen to leave his phone off but is open to turning it back on soon.   - Discussed hospital based resources.  ??  6. CARE COORDINATION:   - Care coordinated with primary team, nursing, and child life.   - Recommend meeting with key medical providers and support person(s) of [name] Gross's choice as we learn more about his condition and treatment options. He is in agreement that this would be helpful.   ??  ??  Total time spent with patient for evaluation & management (excluding ACP documented separately): 30 minutes (2:30-3:00).  Greater than 50% of this time spent on counseling/coordination of care: Yes.  See ACP Note from today for additional billable service: No, 0 minutes.        We appreciate the consult. The Children's Supportive Care Team can be reached by pager [name]) or email ([email or web address]).   ??  ??  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 His hospital course has been complicated by AKI. His liver function has not yet responded to pulse dose steroids, primary team concerned about overall prognosis.   old his primary team that he is having the symptoms.  He told our team it was okay to pass along this information to his primary team.  ??    Interval events: [name] Gross has continued to have nausea. He notes that his nausea made it difficult to pay attention to the information the team provided him on rounds. He says his abdominal swelling/discomfort has improved.   His creatinine rose from 9/29, nephrology following.   ??    Allergies:  Bee pollen and Pollen extracts  ??  Medications:  Scheduled Meds:  ??? calcium carbonate  300 mg elem calcium Oral BID   ??? calcium carbonate  300 mg elem calcium Oral Once   ??? ergocalciferol  50,000 Units Oral Weekly   ??? famotidine  20 mg Oral Once   ??? famotidine  20 mg Oral BID   ??? flu vacc [id number] 6mos up(PF)  0.5 mL Intramuscular During hospitalization   ??? melatonin  3 mg Oral QPM   ??? multivitamin, with zinc  2 tablet Oral Daily   ??? mycophenolate  540 mg Oral BID   ??? ondansetron  4 mg Intravenous [facility]   ??? ondansetron  4 mg  Intravenous Once   ??? pantoprazole  40 mg Oral Daily   ??? [START ON 08/11/2019] tacrolimus  0.5 mg Oral Once     Continuous Infusions:  ??? sodium chloride 0.45 % 95 mL/hr (08/10/19 1145)     PRN Meds:.diphenhydrAMINE, diphenhydrAMINE, heparin, porcine (PF)      Physical Exam:   Temp:  [35.3 ??C-36.7 ??C] 36.7 ??C  Heart Rate:  [72-84] 73  SpO2 Pulse:  [79] 79  Resp:  [16-18] 16  SpO2:  [100 %] 100 %  BP: (107-116)/(50-78) 107/50  MAP (mmHg):  [67-90] 67   ??  Gen: Laying in bed, tired, nontoxic appearing. Endorsing nausea   HEENT: +scleral icterus   Psych: Flat affect, mental status appeared to be appropriate for age.    ??  Data: Reviewed test results in Epic  ??  Harold Hedge, MD, MPH  PGY-2 Pediatrics        Teaching Physician Attestation:    I saw the patient and counseled as above. I discussed the findings, assessment and plan with Dr. Alma Downs and agree with the findings and plan as documented in Dr. Deanne Coffer note. 30 min face to face as above. Care coordinated with Dr. Derry Lory from primary team.     Waldon Reining, MD, MPH  Attending Physician, Children's Supportive Care Team

## 2019-08-10 NOTE — Unmapped (Signed)
Pediatric Daily Progress Note     Assessment/Plan:     Principal Problem:    Liver transplant rejection (CMS-HCC)  Active Problems:    Recurrent major depressive disorder, in partial remission (CMS-HCC)    Malnutrition of mild degree (CMS-HCC)    History of liver transplant (CMS-HCC)    Sickle cell trait (CMS-HCC)    Transaminitis    Anemia    Hypoalbuminemia    Cholangitis of transplanted liver (CMS-HCC)    Abdominal pain    Melena    Homeless    Dark stools  Resolved Problems:    * No resolved hospital problems. 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 He??requires care in the hospital for transplant rejection protocol treatment.    # Hx Liver Transplant - Subacute/Acute Liver Failure:  Pt has biopsy confirmed severe acute rejection. Liver biopsy completed 9/25; RAI 9/9  - GI/transplant following, appreciate recs   - Solumedrol 40mg  IV qd --> Continued 560 mg pulse dose today (9/29)  - Mycophenolate 540mg  bid    - Reduced Tacrolimus 3mg  bid ->1mg  BID  - May require anti-thymoglobulin if no improvement on pulse dose steroid  - 3x Vit K injection 10 mg. last dose 9/27. INR improved from 1.6 on admission to 1.3 (9/28)  - HIV negative   - CMV viral load not detected, EBV pending   - Vit D level pending  - MELD score 24 - estimated 3 month mortality of 19.6%  - PICC line place 9/28  - Ordering ammonia tomorrow   - Seen by Supportive Care today     FEN/GI:??Abnormal electrolyte levels in setting of ongoing liver disease and recent kidney injury  - Monitor fluid status and daily weights closely.   - went for U/S today. Will f/u results   - Avoid D5 fluids, as patient has developed diabetes in the past (bg 227 on night of 9/25)  -Zofran q8 hours as needed  -Protonix 40 mg daily  -Regular diet   -Daily CMP, Mg, Ph  -giving Ergocalciferol   -providing ensures BID  -aquadex multivitamin      # AKI:  - Creatinine trending upward throughout early hospital stay. Now on the decline reflecting improving renal function  - Etiology uncertain, possibly 2/2 Liver disease, TAC kidney, poor PO intake, hypotension during biopsy, etc.    - Encouraging PO intake (>1.6L of fluids PO/day)   - STRICT I/Os      # Abdominal Pain, dark stools:  Unclear etiology, as FOBT x2 negative.  One possibility is recent use of bismuth-containing medications.      #Severe Malnutrition: ??Prior to admission, patient has been skipping meals due to poor appetite and food insecurity. He had not eaten for 2-4 days prior to arriving at [facility].   - Nutrition consult, appreciate recs  - Thiamine completed 9/26  - Multivitamin, Ca 300 mg BID, vit D 1000u QD   - Monitor phosphorous level. Replenish with neutrophos again today  - Repeat EKG later in admission  ??  # Major Depressive Disorder: Stable. Has hx  of attempted overdoses, with the last attempt in April 2019 at which point he was hospitalized in the Adolescent Psychiatry unit and discharged on Lexapro 20 mg, melatonin 9 mg.  He has not engaged with mental health resources or taken medication for depression or insomnia in 8-10 months.  Denies SI, passive suicidality, self-harm, or HI at this time.   - Psych consulted   - Melatonin nightly  - c/s supportive care   ??  # Homelessness - Social Concerns:??[name] Gross has experienced homelessness for ~5 months when he left his mother's house.  He has severed all ties ties with his family and states his relationship with his mother cannot be reconciled. He also requested contact information of family members to be erased from his chart (this has been completed, but social worker did drop contact info into her note on 9/24 in case he changes his mind during admission).  In addition to food insecurity, he has not been able to secure medications.  He is routinely exposed to alcohol and drugs (marijuana, cocaine, etc.) second-hand through his friendships though he denies current use himself.  On urine drug screen, he tested positive for marijuana only.  - Patient plans to be discharged to a local homeless shelter and begin job hunting  - SW will Chief Technology Officer for [employer] and homeless shelters in [place], [place], and [employer]:??PIV  ??  Discharge criteria:??liver transplant work-up and treatment of acute rejection      Subjective:     Interval History: No acute events overnight. Continues to have blood with BMs. Denies any ongoing abdominal pain. Continues to have nausea.    Objective:     Vital signs in last 24 hours:  Temp:  [36.4 ??C-36.9 ??C] 36.9 ??C  Heart Rate:  [72-73] 73  Resp:  [16-18] 16  BP: (107-110)/(50-59) 107/50  MAP (mmHg):  [67-74] 67  SpO2:  [100 %] 100 %  Intake/Output last 3 shifts:  I/O last 3 completed shifts:  In: 2006 [P.O.:2006]  Out: 1100 [Urine:1100]    Physical Exam:  Gen: Thin young male lying in bed in no acute distress  Eyes: Sclera icteric, conjunctivae non-injected.  ENT: External ear normal, mucous membranes moist  Heart: Regular rate and rhythm without murmurs, rubs, or gallops.  S1 and S2 normal. 2+ radial pulses   Pulmonary: Good air movement. Clear to auscultation bilaterally. Normal work of breathing  Skin: No rashes  Abd: Normal bowel sounds, soft, non-tender, non distended.  Ext: Warm and well perfused without cyanosis, clubbing, or edema.  Neuro: No focal findings    Active Medications reviewed and KEY Medications include:       Current Facility-Administered Medications:   ???  calcium carbonate (OS-CAL) tablet 300 mg elem calcium, 300 mg elem calcium, Oral, BID, Anastasia Pall, MD, 300 mg elem calcium at 08/09/19 0915  ???  diphenhydrAMINE (BENADRYL) capsule/tablet 50 mg, 50 mg, Oral, Nightly PRN, Audrie Lia, MD, 50 mg at 08/09/19 1713  ???  ergocalciferol (DRISDOL) capsule 50,000 Units, 50,000 Units, Oral, Weekly, Ejiofor A Lucky Cowboy., MD, 50,000 Units at 08/08/19 1214  ???  famotidine (PEPCID) tablet 20 mg, 20 mg, Oral, BID, Val Eagle, 20 mg at 08/09/19 1613  ???  heparin preservative-free injection 10 units/mL syringe (HEPARIN LOCK FLUSH), 20 Units, Intravenous, Daily PRN, Anastasia Pall, MD, 20 Units at 08/09/19 0507  ???  influenza vaccine quad (FLUARIX, FLULAVAL, FLUZONE) (6 MOS & UP) 2020-21, 0.5 mL, Intramuscular, During hospitalization,  Anastasia Pall, MD  ???  melatonin tablet 3 mg, 3 mg, Oral, QPM, Anastasia Pall, MD, 3 mg at 08/08/19 2113  ???  multivitamin, with zinc (AQUADEKS) chewable tablet, 2 tablet, Oral, Daily, Ejiofor A Lucky Cowboy., MD, 2 tablet at 08/09/19 0914  ???  mycophenolate (MYFORTIC) EC tablet 540 mg, 540 mg, Oral, BID, Anastasia Pall, MD, 540 mg at 08/09/19 0914  ???  pantoprazole (PROTONIX) EC tablet 40 mg, 40 mg, Oral, Daily, Almon Register, MD, 40 mg at 08/08/19 2113  ???  tacrolimus (PROGRAF) capsule 1 mg, 1 mg, Oral, BID, David Stall, MD, 1 mg at 08/09/19 1610        Studies: Personally reviewed and interpreted.  Labs/Studies:  Labs and Studies from the last 24hrs per EMR and Reviewed and   All lab results last 24 hours:    Recent Results (from the past 24 hour(s))   POCT Glucose    Collection Time: 08/09/19  1:11 AM   Result Value Ref Range    Glucose, POC 182 (H) 70 - 179 mg/dL   Comprehensive Metabolic Panel    Collection Time: 08/09/19  5:07 AM   Result Value Ref Range    Sodium 136 135 - 145 mmol/L    Potassium 4.7 3.5 - 5.0 mmol/L    Chloride 110 (H) 98 - 107 mmol/L    Anion Gap 3 (L) 7 - 15 mmol/L    CO2 23.0 22.0 - 30.0 mmol/L    BUN 25 (H) 7 - 21 mg/dL Creatinine 9.60 4.54 - 1.30 mg/dL    BUN/Creatinine Ratio 19     EGFR CKD-EPI Non-African American, Male 80 >=60 mL/min/1.32m2    EGFR CKD-EPI African American, Male >90 >=60 mL/min/1.14m2    Glucose 145 70 - 179 mg/dL    Calcium 7.3 (L) 8.5 - 10.2 mg/dL    Albumin 2.1 (L) 3.5 - 5.0 g/dL    Total Protein 7.5 6.5 - 8.3 g/dL    Total Bilirubin 09.8 (H) 0.0 - 1.2 mg/dL    AST 119 (H) 19 - 55 U/L    ALT 351 (H) <50 U/L    Alkaline Phosphatase 1,286 (H) 65 - 260 U/L   Magnesium Level    Collection Time: 08/09/19  5:07 AM   Result Value Ref Range    Magnesium 2.1 1.6 - 2.2 mg/dL   Phosphorus Level    Collection Time: 08/09/19  5:07 AM   Result Value Ref Range    Phosphorus 3.3 2.9 - 4.7 mg/dL   POCT Glucose    Collection Time: 08/09/19 10:55 AM   Result Value Ref Range    Glucose, POC 132 70 - 179 mg/dL   POCT Glucose    Collection Time: 08/09/19  2:39 PM   Result Value Ref Range    Glucose, POC 167 70 - 179 mg/dL   POCT Glucose    Collection Time: 08/09/19  5:30 PM   Result Value Ref Range    Glucose, POC 202 (H) 70 - 179 mg/dL     ========================================  Val Eagle, MD  Anesthesiology, PGY-1  [phone]    I supervised the resident physician on subsequent day care who spent at least 35 minutes on the floor or unit in direct patient care. The direct patient care time included face-to-face time with the patient, reviewing the patient's chart, communicating with the family and/or other professionals and coordinating care. Greater than 50% of this time was spent in counseling or coordinating  care with the patient regarding acute liver rejection following medication non compliance and difficult social/living situation. [name] Gross's liver status remains tenuous though stable while renal function appears to be improving while we continue maximum immune suppression and support hydration. I was available throughout care provided.     Pablo Ledger MD, MPH, MMSc.  Attending Physician; Peds GI/Hepatology/Nutrition  [facility]

## 2019-08-10 NOTE — Unmapped (Signed)
Pediatric Tacrolimus Therapeutic Monitoring Pharmacy Note  ??  [name] Gross is a 18 y.o. male continuing tacrolimus.   ??  Indication: Liver transplant   ??  Date of Transplant: 09/22/2016      Prior Dosing Information: Tacrolimus capsules 1 mg (0.017 mg/kg/DOSE) PO every 12 hours  ??  Dosing Weight: 55.9 kg  ??  Goals:  Therapeutic Drug Levels  Tacrolimus trough goal: 5-7 ng/mL  ??  Additional Clinical Monitoring/Outcomes  ?? Monitor renal function (SCr and urine output) and liver function (LFTs)  ?? Monitor for signs/symptoms of adverse events (e.g., hyperglycemia, hyperkalemia, hypomagnesemia, hypertension, headache, tremor)  ??  Results:   Tacrolimus level: 10.5 ng/mL, drawn appropriately. 9/28 PM dose held after 11.9 trough then regimen changed to 1mg  bid which started 9/29 AM. After 2 doses of 1mg  bid trough decreased very little. This is likely due to drug accumulation/reduced clearance due to liver dysfunction.  ??  Longitudinal Dose Monitoring:  Date Dose (mg)  AM / PM AM Scr (mg/dL) Level  (ng/mL) Key Drug Interactions   08/11/19 0.5 mg /      08/10/19 1 mg / HOLD 1.4 10.5 None   08/09/19 1 mg / 1 mg 1.29 --- None   08/08/19 3 mg / HOLD 1.5 11.9 None   08/06/19 5 mg / 3 mg 0.8 9 None   ??  Pharmacokinetic Considerations and Significant Drug Interactions:  ?? Concurrent hepatotoxic medications: None identified  ?? Concurrent CYP3A4 substrates/inhibitors: None identified  ?? Concurrent nephrotoxic medications: None identified  ??  Assessment/Plan:  Recommendedation(s)  ?? HOLD tacrolimus dose tonight (08/10/19)  ?? Give tacrolimus 0.5mg  PO on 08/11/19 at 1000 x 1 dose only  ??  Follow-up  ?? Next level ordered: 08/11/19 at 1000 before AM dose given  ?? A pharmacist will continue to monitor and recommend levels as appropriate  ??  The above plan was discussed and approved by Anastasia Pall, MD  ??  Please page service pharmacist with questions/clarifications.  ??  Fransico Meadow, PharmD  Pediatric Clinical Pharmacist  [phone] (pediatric pharmacy)

## 2019-08-10 NOTE — Unmapped (Addendum)
Pediatric Daily Progress Note     Assessment/Plan:     Principal Problem:    Liver transplant rejection (CMS-HCC)  Active Problems:    Recurrent major depressive disorder, in partial remission (CMS-HCC)    Malnutrition of mild degree (CMS-HCC)    History of liver transplant (CMS-HCC)    Sickle cell trait (CMS-HCC)    Transaminitis    Anemia    Hypoalbuminemia    Cholangitis of transplanted liver (CMS-HCC)    Abdominal pain    Melena    Homeless    Dark stools  Resolved Problems:    * No resolved hospital problems. 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 He??requires care in the hospital for transplant rejection protocol treatment. Is continually nauseous, making fluid intake difficult. Rise in creatinine overnight concerning for kidney un-resolved ATN, nephrology to re-examine urine and urine studies repeated today.    # Hx Liver Transplant - Subacute/Acute Liver Failure:  Pt has biopsy confirmed severe acute rejection. Liver biopsy completed 9/25; RAI 9/9  - GI/transplant following, appreciate recs   - Solumedrol 40mg  IV qd --> Last dose of 560 mg pulse dose today (9/30)  - Mycophenolate 540mg  bid    - HOLD tacrolimus dose tonight (08/10/19). Give tacrolimus 0.5mg  PO on 08/11/19 at 1000 x 1 dose only  - Considering anti-thymoglobulin if no improvement on pulse dose steroid  - 3x Vit K injection 10 mg. last dose 9/27. INR improved from 1.6 on admission to 1.3 (9/28)   -  EBV pending, HIV negative, CMV viral load not detected  - Vit D level pending  - MELD score 24 - estimated 3 month mortality of 20%  - PICC line place 9/28  - Ammonia level of 31    FEN/GI:??Abnormal electrolyte levels in setting of ongoing liver disease and recent kidney injury  - Monitor fluid status and daily weights closely.   - U/S read: 're-demonstrated heterogenous liver w/ periportal edema, slightly decreased. Small ascites, increased from prior. Right pleural effusion. Mild splenomegaly.'  - Avoid D5 fluids, as patient has developed diabetes in the past (bg 227 on night of 9/25)  -Zofran q8 hours as needed  -Protonix 40 mg daily  -Regular diet   -Daily CMP, Mg, Ph  -giving Ergocalciferol   -providing ensures BID  -aquadex multivitamin      # AKI:  - Nephro following  - Creatinine rose overnight. Nephro getting repeat UA, Urine Na, Urine Cr, Urine urea. Dr. Willis Modena will examine urine sample under microscopy.   - Etiology uncertain, possibly 2/2 Liver disease, TAC kidney, poor PO intake, hypotension during biopsy, etc.    - started on 1/2 NS maintenence  - Encouraging PO intake (>1.6L of fluids PO/day)   - STRICT I/Os      # Abdominal Pain, dark stools:  Unclear etiology, as FOBT x2 negative.  One possibility is recent use of bismuth-containing medications.      #Severe Malnutrition: ??Prior to admission, patient has been skipping meals due to poor appetite and food insecurity.  - Nutrition consult, appreciate recs  -  Thiamine completed 9/26  - Multivitamin, Ca 300 mg BID, vit D 1000u QD   - Monitor phosphorous level. Replenish with neutrophos again today  - Repeat EKG later in admission  ??  # Major Depressive Disorder: Stable. Has hx of attempted overdoses, with the last attempt in April 2019 at which point he was hospitalized in the Adolescent Psychiatry unit and discharged on Lexapro 20 mg, melatonin 9 mg.  He has not engaged with mental health resources or taken medication for depression or insomnia in 8-10 months.  Denies SI, passive suicidality, self-harm, or HI at this time.   - Psych consulted   - Melatonin nightly  - c/s supportive care   ??  # Homelessness - Social Concerns:??[name] Gross has experienced homelessness for ~5 months when he left his mother's house.  He has severed all ties ties with his family and states his relationship with his mother cannot be reconciled.  He also requested contact information of family members to be erased from his chart (this has been completed, but social worker did drop contact info into her note on 9/24 in case he changes his mind during admission).  In addition to food insecurity, he has not been able to secure medications.  He is routinely exposed to alcohol and drugs (marijuana, cocaine, etc.) second-hand through his friendships though he denies current use himself.  On urine drug screen, he tested positive for marijuana only.  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 Would like [name] Gross to work on naming a Runner, broadcasting/film/video in the event he cannot make decisions for himself.     Access:??PIV  ??  Discharge criteria:??liver transplant work-up and treatment of acute rejection      Subjective:     Interval History: No acute events overnight. Had an episode of emesis while we were in the room today. State he's still nauseous.     Objective:     Vital signs in last 24 hours:  Temp:  [36.8 ??C-36.9 ??C] 36.8 ??C  Heart Rate:  [74] 74  Resp:  [18] 18  BP: (105-114)/(44-56) 105/44  MAP (mmHg):  [62-74] 62  SpO2:  [99 %] 99 %  Intake/Output last 3 shifts:  I/O last 3 completed shifts:  In: 2120 [P.O.:2120]  Out: 1025 [Urine:1025]    Physical Exam:  Gen: Sitting upright in bed, uncomfortable and vomiting.  Eyes: EOMI, scleral icterus  Heart: regular rate and rhythm. No murmurs appreciated on auscultation   Pulmonary: clear breath sounds bilaterally, no increased work of breathing. No wheezing appreciated on auscultation.  Skin: no rashes  Abd: soft, mildly tender. Mildly distended. Bowel sounds present.  Ext: full ROM in all extremities  Neuro: no focal neurological deficits noted.     Active Medications reviewed and KEY Medications include:       Current Facility-Administered Medications:   ???  calcium carbonate (OS-CAL) tablet 300 mg elem calcium, 300 mg elem calcium, Oral, BID, Anastasia Pall, MD, 300 mg elem calcium at 08/10/19 1012  ???  diphenhydrAMINE (BENADRYL) capsule/tablet 25 mg, 25 mg, Oral, Daily PRN, Anastasia Pall, MD, 25 mg at 08/10/19 1218  ???  diphenhydrAMINE (BENADRYL) capsule/tablet 50 mg, 50 mg, Oral, Nightly PRN, Audrie Lia, MD, 50 mg at 08/09/19 1713  ???  ergocalciferol (DRISDOL) capsule 50,000 Units, 50,000 Units, Oral,  Weekly, Ejiofor A Lucky Cowboy., MD, 50,000 Units at 08/08/19 1214  ???  famotidine (PEPCID) tablet 20 mg, 20 mg, Oral, BID, Val Eagle, 20 mg at 08/10/19 1012  ???  heparin preservative-free injection 10 units/mL syringe (HEPARIN LOCK FLUSH), 20 Units, Intravenous, Daily PRN, Anastasia Pall, MD, 20 Units at 08/10/19 1012  ???  influenza vaccine quad (FLUARIX, FLULAVAL, FLUZONE) (6 MOS & UP) 2020-21, 0.5 mL, Intramuscular, During hospitalization, Anastasia Pall, MD  ???  melatonin tablet 3 mg, 3 mg, Oral, QPM, Anastasia Pall, MD, 3 mg at 08/09/19 2118  ???  multivitamin, with zinc (AQUADEKS) chewable tablet, 2 tablet, Oral, Daily, Ejiofor A Lucky Cowboy., MD, 2 tablet at 08/10/19 1011  ???  mycophenolate (MYFORTIC) EC tablet 540 mg, 540 mg, Oral, BID, Anastasia Pall, MD, 540 mg at 08/10/19 1011  ???  ondansetron (ZOFRAN) injection 4 mg, 4 mg, Intravenous, Q8H SCH, Ejiofor A Lucky Cowboy., MD, 4 mg at 08/10/19 1111  ???  ondansetron (ZOFRAN) injection 4 mg, 4 mg, Intravenous, Once, Ejiofor A Lucky Cowboy., MD  ???  pantoprazole (PROTONIX) EC tablet 40 mg, 40 mg, Oral, Daily, Almon Register, MD, 40 mg at 08/09/19 2118  ???  sodium chloride 0.45% (1/2 NS) infusion, 95 mL/hr, Intravenous, Continuous, Ejiofor A Lucky Cowboy., MD, Last Rate: 95 mL/hr at 08/10/19 1145, 95 mL/hr at 08/10/19 1145  ???  tacrolimus (PROGRAF) capsule 1 mg, 1 mg, Oral, BID, David Stall, MD, 1 mg at 08/10/19 1012        Studies: Personally reviewed and interpreted.  Labs/Studies:  Labs and Studies from the last 24hrs per EMR and Reviewed and   All lab results last 24 hours:    Recent Results (from the past 24 hour(s))   POCT Glucose    Collection Time: 08/09/19  2:39 PM   Result Value Ref Range    Glucose, POC 167 70 - 179 mg/dL   POCT Glucose    Collection Time: 08/09/19  5:30 PM   Result Value Ref Range    Glucose, POC 202 (H) 70 - 179 mg/dL   POCT Glucose    Collection Time: 08/09/19  9:34 PM   Result Value Ref Range    Glucose, POC 165 70 - 179 mg/dL   Tacrolimus Level, Trough    Collection Time: 08/10/19 10:09 AM   Result Value Ref Range    Tacrolimus, Trough 10.5 5.0 - 15.0 ng/mL   Magnesium Level    Collection Time: 08/10/19 10:09 AM   Result Value Ref Range    Magnesium 2.3 (H) 1.6 - 2.2 mg/dL   Phosphorus Level    Collection Time: 08/10/19 10:09 AM   Result Value Ref Range    Phosphorus 4.3 2.9 - 4.7 mg/dL   Comprehensive Metabolic Panel    Collection Time: 08/10/19 10:09 AM   Result Value Ref Range    Sodium 139 135 - 145 mmol/L    Potassium 5.1 (H) 3.5 - 5.0 mmol/L    Chloride 108 (H) 98 - 107 mmol/L    Anion Gap 9 7 - 15 mmol/L    CO2 22.0 22.0 - 30.0 mmol/L    BUN 29 (H) 7 - 21 mg/dL    Creatinine 1.61 (H) 0.70 - 1.30 mg/dL    BUN/Creatinine Ratio 21     EGFR CKD-EPI Non-African American, Male 72 >=60 mL/min/1.42m2    EGFR CKD-EPI African American, Male 84 >=60 mL/min/1.14m2    Glucose 184 (H) 70 - 179 mg/dL  Calcium 7.8 (L) 8.5 - 10.2 mg/dL    Albumin 2.4 (L) 3.5 - 5.0 g/dL    Total Protein 8.4 (H) 6.5 - 8.3 g/dL    Total Bilirubin 09.8 (H) 0.0 - 1.2 mg/dL    AST 119 (H) 19 - 55 U/L    ALT 480 (H) <50 U/L Alkaline Phosphatase 1,235 (H) 65 - 260 U/L   Ammonia    Collection Time: 08/10/19 10:09 AM   Result Value Ref Range    Ammonia 31 9 - 33 umol/L   POCT Glucose    Collection Time: 08/10/19 10:11 AM   Result Value Ref Range    Glucose, POC 180 (H) 70 - 179 mg/dL     ========================================  Gerrie Nordmann, MD  [facility], PGY-1  [phone]    I supervised the resident physician on subsequent day care who spent at least 35 minutes on the floor or unit in direct patient care. The direct patient care time included face-to-face time with the patient, reviewing the patient's chart, communicating with the family and/or other professionals and coordinating care. Greater than 50% of this time was spent in counseling or coordinating care with the patient regarding ongoing liver rejection management with worsening of renal function overnight. 1/2NS fluids started to ensure hydration in light of [name] Gross's nausea and vomiting this morning. He responded to Zofran. Next step for ongoing liver rejection will likely involve Thymoglobulin. Also discussed OOB and sitting upright with incentive spirometry.  I was available throughout care provided.     Pablo Ledger MD, MPH, MMSc.  Attending Physician; Peds GI/Hepatology/Nutrition  [facility]

## 2019-08-10 NOTE — Unmapped (Signed)
Creatinine rose overnight, which is not the typical pattern for resolving ATN.     Suspect tacrolimus may be high again, which could explain the creatinine rise, but would still collect a new urine sample and send for urinalysis, urine sodium, urine creatinine, and urine urea in case tacrolimus toxicity is not the reason. Please save 10ml of urine at the bedside for me to look at under the microscope as well and page me when it's there and I'll come get it. [phone]

## 2019-08-11 LAB — COMPREHENSIVE METABOLIC PANEL
ALBUMIN: 2 g/dL — ABNORMAL LOW (ref 3.5–5.0)
ALKALINE PHOSPHATASE: 1288 U/L — ABNORMAL HIGH (ref 65–260)
ANION GAP: 5 mmol/L — ABNORMAL LOW (ref 7–15)
AST (SGOT): 339 U/L — ABNORMAL HIGH (ref 19–55)
BILIRUBIN TOTAL: 15.4 mg/dL — ABNORMAL HIGH (ref 0.0–1.2)
BLOOD UREA NITROGEN: 28 mg/dL — ABNORMAL HIGH (ref 7–21)
BUN / CREAT RATIO: 22
CALCIUM: 7.4 mg/dL — ABNORMAL LOW (ref 8.5–10.2)
CHLORIDE: 106 mmol/L (ref 98–107)
CO2: 25 mmol/L (ref 22.0–30.0)
CREATININE: 1.26 mg/dL (ref 0.70–1.30)
EGFR CKD-EPI AA MALE: >90 mL/min/{1.73_m2} (ref >=60)
EGFR CKD-EPI NON-AA MALE: 82 mL/min/{1.73_m2} (ref >=60)
GLUCOSE RANDOM: 178 mg/dL (ref 70–179)
POTASSIUM: 4.9 mmol/L (ref 3.5–5.0)
PROTEIN TOTAL: 6.9 g/dL (ref 6.5–8.3)
SODIUM: 136 mmol/L (ref 135–145)

## 2019-08-11 LAB — CALCIUM: Calcium:MCnc:Pt:Ser/Plas:Qn:: 7.4 — ABNORMAL LOW

## 2019-08-11 LAB — PHOSPHORUS: Phosphate:MCnc:Pt:Ser/Plas:Qn:: 3.6

## 2019-08-11 LAB — TACROLIMUS, TROUGH: Lab: 10.1

## 2019-08-11 LAB — MAGNESIUM: Magnesium:MCnc:Pt:Ser/Plas:Qn:: 2.1

## 2019-08-11 NOTE — Unmapped (Signed)
Pt VSS and afebrile this shift; increased nausea, scheduled zofran started and phenergan x1; threw up pills this AM, discussed with team and they worked with pharmacy to reorder; tacro trough drawn as ordered; pt with BMx2 today, states they have a small amount of blood; UA and urine labs sent; started on IV fluids for hydration and encouraging drinking; will continue to monitor       Problem: Adult Inpatient Plan of Care  Goal: Plan of Care Review  Outcome: Ongoing - Unchanged  Goal: Patient-Specific Goal (Individualization)  Outcome: Ongoing - Unchanged  Goal: Absence of Hospital-Acquired Illness or Injury  Outcome: Ongoing - Unchanged  Goal: Optimal Comfort and Wellbeing  Outcome: Ongoing - Unchanged  Goal: Readiness for Transition of Care  Outcome: Ongoing - Unchanged  Goal: Rounds/Family Conference  Outcome: Ongoing - Unchanged     Problem: Wound  Goal: Optimal Wound Healing  Outcome: Ongoing - Unchanged

## 2019-08-11 NOTE — Unmapped (Signed)
Pediatric Tacrolimus Therapeutic Monitoring Pharmacy Note  ??  [name] Grossis a 18 y.o.??male??continuing??tacrolimus.   ??  Indication:??Liver transplant??  ??  Date of Transplant: 09/22/2016??  ??  Prior Dosing Information: Tacrolimus 0.5mg  cap (0.008??mg/kg/DOSE) PO given x1 08/11/19 @ 0955  ??  Dosing Weight: 55.9 kg  ??  Goals:  Therapeutic Drug Levels  Tacrolimus trough goal: 5-7 ng/mL  ??  Additional Clinical Monitoring/Outcomes  ?? Monitor renal function (SCr and urine output) and liver function (LFTs)  ?? Monitor for signs/symptoms of adverse events (e.g., hyperglycemia, hyperkalemia, hypomagnesemia, hypertension, headache, tremor)  ??  Results:   Tacrolimus level:??10.1 ng/mL after HOLDING the 08/10/19 PM dose. Tacrolimus 0.5mg  PO x1 was given 08/11/19 AM after level was drawn. Slow clearance is likely due to drug accumulation/reduced clearance due to liver dysfunction.  ??  Longitudinal Dose Monitoring:  Date Dose (mg)  AM / PM AM Scr (mg/dL) Level  (ng/mL) Key Drug Interactions   08/11/19 0.5 mg / --- ??1.26 ??10.1 ??None   08/10/19 1 mg / HOLD 1.4 10.5 None   08/09/19 1 mg / 1 mg 1.29 --- None   08/08/19 3 mg / HOLD 1.5 11.9 None   08/06/19 5 mg / 3 mg 0.8 9 None   ??  Pharmacokinetic Considerations and Significant Drug Interactions:  ?? Concurrent hepatotoxic medications: None identified  ?? Concurrent CYP3A4 substrates/inhibitors: None identified  ?? Concurrent nephrotoxic medications: None identified  ??  Assessment/Plan:  Recommendedation(s)  ?? HOLD ALL tacrolimus doses for now  ?? Order DAILY tacrolimus levels until trending toward goal range of 5-7  ?? Note, patient started on thymoglobulin and high dose steroids 08/11/19  ??  Follow-up  ?? Next level ordered: 08/12/19 at 1000 and same time daily.  ?? A pharmacist will continue to monitor and recommend levels as appropriate  ??  The above plan was discussed and approved by??Blenda Mounts, MD  ??  Please page service pharmacist with questions/clarifications.  ??  Fransico Meadow, PharmD  Pediatric Clinical Pharmacist  [phone] (pediatric pharmacy)

## 2019-08-11 NOTE — Unmapped (Signed)
Pt VSS and afebrile. PIV c/d/I and saline locked. PICC c/d/I and infusing IVF. Pt complained of tingling in his feet. Md notified. Pt had pain with BM and wiping. MD notified and came to bedside to assess. Tac level drawn at 1000. Pt given fluid goal. Scheduled medications given. Pt complained of nausea x1. Scheduled zofran given. Pt had large emesis. Tan/ pink. MD notified and came to bedside.No one at bedside. Will continue to monitor.       Problem: Adult Inpatient Plan of Care  Goal: Plan of Care Review  Outcome: Ongoing - Unchanged  Goal: Patient-Specific Goal (Individualization)  Outcome: Ongoing - Unchanged  Goal: Absence of Hospital-Acquired Illness or Injury  Outcome: Ongoing - Unchanged  Goal: Optimal Comfort and Wellbeing  Outcome: Ongoing - Unchanged  Goal: Readiness for Transition of Care  Outcome: Ongoing - Unchanged  Goal: Rounds/Family Conference  Outcome: Ongoing - Unchanged     Problem: Wound  Goal: Optimal Wound Healing  Outcome: Ongoing - Unchanged

## 2019-08-11 NOTE — Unmapped (Signed)
Pediatric Daily Progress Note     Assessment/Plan:     Principal Problem:    Liver transplant rejection (CMS-HCC)  Active Problems:    Recurrent major depressive disorder, in partial remission (CMS-HCC)    Malnutrition of mild degree (CMS-HCC)    History of liver transplant (CMS-HCC)    Sickle cell trait (CMS-HCC)    Transaminitis    Anemia    Hypoalbuminemia    Cholangitis of transplanted liver (CMS-HCC)    Abdominal pain    Melena    Homeless    Dark stools  Resolved Problems:    * No resolved hospital problems. 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 He??requires care in the hospital for transplant rejection protocol treatment. Numan seemed much improved this morning, was more engaged and sitting upright in chair although this afternoon continued to have nausea and vomiting. Will closely monitor his continued N/V and have a low threshold to get a head CT to rule out a central process. IV fluids running to maintain hydration in the setting of variable oral intake.    # Hx Liver Transplant - Subacute/Acute Liver Failure:  Pt has biopsy confirmed severe acute rejection. Liver biopsy completed 9/25; RAI 9/9  - GI/transplant following, appreciate recs   - Begin 1.5 mg/kg anti-thymoglobulin with 1 gm MethylPred per rejection protocol  - s/p Solumedrol 560 mg IV qd. Will adjust dose this afternoon if starting thymoglobulin  - Mycophenolate 540mg  bid    - 0.5mg  Tacro given this AM. Will hold off on future doses for now per Pharmacy recommendations given elevated troughs.   - Checking Tacro levels daily until in goal range of 5-7  - 3x Vit K injection 10 mg. last dose 9/27. INR improved from 1.6 on admission to 1.3 (9/28)   - EBV pending, HIV negative, CMV viral load not detected  - Vit D level pending  - MELD score 24 - estimated 3 month mortality of 20%  - PICC line place 9/28  - Ammonia level of 31    FEN/GI:??Abnormal electrolyte levels in setting of ongoing liver disease and recent kidney injury  - Monitor fluid status and daily weights closely.   - Avoid D5 fluids, as patient has developed diabetes in the past (bg 227 on night of 9/25).   -Zofran q8 hours scheduled  -Protonix 40 mg daily  -Regular diet   -Daily CMP, Mg, Ph  -giving Ergocalciferol   -providing ensures BID  -aquadex multivitamin      # AKI:  - Nephro following  - Improved creatinine today  - Etiology uncertain, possibly 2/2 Liver disease, TAC kidney, poor PO intake, hypotension during biopsy.  - continue on 1/2 NS mIVF  - Encouraging PO intake (>1.6L of fluids PO/day)   - STRICT I/Os      # Abdominal Pain, dark stools:  Unclear etiology, as FOBT x2 negative.  One possibility is recent use of bismuth-containing medications.    - Perianal exam (10/1) within normal limits with mild perianal skin breakdown. No external hemorrhoids    #Severe Malnutrition: ??Prior to admission, patient has been  skipping meals due to poor appetite and food insecurity.  - Nutrition consult, appreciate recs  - Thiamine completed 9/26  - Multivitamin, Ca 300 mg BID, vit D 1000u QD   - Monitor phosphorous level. Replenish with neutrophos again today  - Repeat EKG later in admission  ??  # Major Depressive Disorder: Stable. Has hx of attempted overdoses, with the last attempt in April 2019 at which point he was hospitalized in the Adolescent Psychiatry unit and discharged on Lexapro 20 mg, melatonin 9 mg.  He has not engaged with mental health resources or taken medication for depression or insomnia in 8-10 months.  Denies SI, passive suicidality, self-harm, or HI at this time.   - Psych consulted   - Melatonin nightly  - c/s supportive care   ??  # Homelessness - Social Concerns:??Rio has experienced homelessness for ~5 months when he left his mother's house.  He has severed all ties ties with his family and states his relationship with his mother cannot be reconciled.  He also requested contact information of family members to be erased from his chart (this has been completed, but social worker did drop contact info into her note on 9/24 in case he changes his mind during admission).  In addition to food insecurity, he has not been able to secure medications.  He is routinely exposed to alcohol and drugs (marijuana, cocaine, etc.) second-hand through his friendships though he denies current use himself.  On urine drug screen, he tested positive for marijuana only.  - Patient plans to be discharged to a local homeless shelter and begin job hunting  - SW will Chief Technology Officer for [employer] and homeless shelters in [place], [place], and [employer]  -Supportive care following. Would like [place] to work on naming a Runner, broadcasting/film/video in the event he cannot make decisions for himself.     Access:??PIV  ??  Discharge criteria:??liver transplant work-up and treatment of acute rejection      Subjective:     Interval History: No acute events overnight, was able to take better PO. This morning he was doing much better, able to chat with [place] and was sitting upright in chair. Did complain of some numbness/tingling in his feet after standing up from going to the bathroom. Also noted some blood on wipes after BM. This afternoon, he experienced more nausea/vomiting.     Objective:     Vital signs in last 24 hours:  Temp:  [36.8 ??C] 36.8 ??C  Heart Rate:  [73-78] 73  Resp:  [16-18] 16  BP: (105-110)/(44-64) 110/56  MAP (mmHg):  [62-70] 70  SpO2:  [99 %] 99 %  Intake/Output last 3 shifts:  I/O last 3 completed shifts:  In: 2770 [P.O.:1520; I.V.:1250]  Out: 1700 [Urine:1700]    Physical Exam:  Gen: Sitting upright in in chair, in no acute distress  Eyes: EOMI, scleral icterus  Heart: regular rate and rhythm. No murmurs/rubs/gallops appreciated.  Pulmonary: clear breath sounds bilaterally, no increased work of breathing. No wheezes/crackles/rales  Skin: no rashes  Abd: soft, mildly tender. Mildly distended with hepatomegaly appreciated.  Ext: full ROM in all extremities  Neuro: no focal neurological deficits noted.     Active Medications reviewed and KEY Medications include:       Current Facility-Administered Medications:   ???  calcium carbonate (OS-CAL) tablet 300 mg elem calcium, 300 mg elem calcium, Oral, BID, Anastasia Pall, MD, 300 mg elem calcium at 08/10/19 2159  ???  calcium  carbonate (TUMS) chewable tablet 300 mg elem calcium, 300 mg elem calcium, Oral, Once, Anastasia Pall, MD  ???  diphenhydrAMINE (BENADRYL) capsule/tablet 25 mg, 25 mg, Oral, Daily PRN, Anastasia Pall, MD, 25 mg at 08/10/19 1218  ???  diphenhydrAMINE (BENADRYL) capsule/tablet 50 mg, 50 mg, Oral, Nightly PRN, Audrie Lia, MD, 50 mg at 08/09/19 1713  ???  ergocalciferol (DRISDOL) capsule 50,000 Units, 50,000 Units, Oral, Weekly, Ejiofor A Lucky Cowboy., MD, 50,000 Units at 08/08/19 1214  ???  famotidine (PEPCID) oral suspension, 20 mg, Oral, Once, Anastasia Pall, MD  ???  famotidine (PEPCID) tablet 20 mg, 20 mg, Oral, BID, Val Eagle, 20 mg at 08/10/19 2047  ???  heparin preservative-free injection 10 units/mL syringe (HEPARIN LOCK FLUSH), 20 Units, Intravenous, Daily PRN, Anastasia Pall, MD, 20 Units at 08/10/19 1012  ???  influenza vaccine quad (FLUARIX, FLULAVAL, FLUZONE) (6 MOS & UP) 2020-21, 0.5 mL, Intramuscular, During hospitalization, Anastasia Pall, MD  ???  melatonin tablet 3 mg, 3 mg, Oral, QPM, Anastasia Pall, MD, 3 mg at 08/10/19 2159  ???  multivitamin, with zinc (AQUADEKS) chewable tablet, 2 tablet, Oral, Daily, Ejiofor A Lucky Cowboy., MD, 2 tablet at 08/10/19 1011  ???  mycophenolate (MYFORTIC) EC tablet 540 mg, 540 mg, Oral, BID, Anastasia Pall, MD, 540 mg at 08/10/19 2346  ???  ondansetron (ZOFRAN) injection 4 mg, 4 mg, Intravenous, Q8H SCH, Ejiofor A Lucky Cowboy., MD, 4 mg at 08/11/19 0459  ???  ondansetron (ZOFRAN) injection 4 mg, 4 mg, Intravenous, Once, Ejiofor A Lucky Cowboy., MD  ???  pantoprazole (PROTONIX) EC tablet 40 mg, 40 mg, Oral, Daily, Almon Register, MD, 40 mg at 08/10/19 2159  ???  sodium chloride 0.45% (1/2 NS) infusion, 95 mL/hr, Intravenous, Continuous, Ejiofor A Lucky Cowboy., MD, Last Rate: 95 mL/hr at 08/10/19 2152, 95 mL/hr at 08/10/19 2152  ???  tacrolimus (PROGRAF) capsule 0.5 mg, 0.5 mg, Oral, Once, Ejiofor A Lucky Cowboy., MD        Studies: Personally reviewed and interpreted.  Labs/Studies:  Labs and Studies from the last 24hrs per EMR and Reviewed and   All lab results last 24 hours:      ========================================  Gerrie Nordmann, MD  [facility], PGY-1  [phone]    I supervised the resident physician on subsequent day care who spent at least 35 minutes on the floor or unit in direct patient care. The direct patient care time included face-to-face time with the patient, reviewing the patient's chart, communicating with the family and/or other professionals and coordinating care. Greater than 50% of this time was spent in counseling or coordinating care with the patient regarding management of liver rejection and slowly recovering kidney function. Decorian was more interactive this morning and well appearing while labs also slightly improved. N/V continued throughout the day and vitals remained stable. IV 1/2NS running while N/V hampers PO hydration. We will reassess in the morning. Low threshold for head CT to evaluate central causes for persistent N/V though suspicion is low. Perianal exam WNL with mild skin break down and no external hemorrhoids present in light of reports of BPR on toilet paper. I was available throughout care provided.     Pablo Ledger MD, MPH, MMSc.  Attending Physician; Peds GI/Hepatology/Nutrition  [facility]

## 2019-08-11 NOTE — Unmapped (Signed)
Andres's VSS on RA, remains afebrile. PICC C/D/I, infusing fluids. R PIV C/D/I, SL. Pt. Voiding well, no BM noted. Drinking well and encouraging PO intake. Pt. Did not complain of pain or nausea. Continued abdominal tenderness mainly on right side. Pt. Resting well overnight, no family at bedside.     Problem: Adult Inpatient Plan of Care  Goal: Plan of Care Review  Outcome: Ongoing - Unchanged  Goal: Patient-Specific Goal (Individualization)  Outcome: Ongoing - Unchanged  Goal: Absence of Hospital-Acquired Illness or Injury  Outcome: Ongoing - Unchanged  Goal: Optimal Comfort and Wellbeing  Outcome: Ongoing - Unchanged  Goal: Readiness for Transition of Care  Outcome: Ongoing - Unchanged  Goal: Rounds/Family Conference  Outcome: Ongoing - Unchanged     Problem: Wound  Goal: Optimal Wound Healing  Outcome: Ongoing - Unchanged

## 2019-08-12 LAB — COMPREHENSIVE METABOLIC PANEL
ALBUMIN: 2.1 g/dL — ABNORMAL LOW (ref 3.5–5.0)
ALKALINE PHOSPHATASE: 1187 U/L — ABNORMAL HIGH (ref 65–260)
ALT (SGPT): 487 U/L — ABNORMAL HIGH (ref <50)
ANION GAP: 1 mmol/L — ABNORMAL LOW (ref 7–15)
AST (SGOT): 396 U/L — ABNORMAL HIGH (ref 19–55)
BILIRUBIN TOTAL: 16.6 mg/dL — ABNORMAL HIGH (ref 0.0–1.2)
BLOOD UREA NITROGEN: 31 mg/dL — ABNORMAL HIGH (ref 7–21)
BUN / CREAT RATIO: 25
CALCIUM: 7.3 mg/dL — ABNORMAL LOW (ref 8.5–10.2)
CHLORIDE: 111 mmol/L — ABNORMAL HIGH (ref 98–107)
CO2: 24 mmol/L (ref 22.0–30.0)
CREATININE: 1.25 mg/dL (ref 0.70–1.30)
EGFR CKD-EPI AA MALE: >90 mL/min/{1.73_m2} (ref >=60)
EGFR CKD-EPI NON-AA MALE: 83 mL/min/{1.73_m2} (ref >=60)
POTASSIUM: 5 mmol/L (ref 3.5–5.0)
PROTEIN TOTAL: 7 g/dL (ref 6.5–8.3)
SODIUM: 136 mmol/L (ref 135–145)

## 2019-08-12 LAB — CBC W/ AUTO DIFF
BASOPHILS ABSOLUTE COUNT: 0 10*9/L (ref 0.0–0.1)
BASOPHILS RELATIVE PERCENT: 0.1 %
EOSINOPHILS ABSOLUTE COUNT: 0 10*9/L (ref 0.0–0.4)
EOSINOPHILS RELATIVE PERCENT: 0.1 %
HEMATOCRIT: 31.1 % — ABNORMAL LOW (ref 41.0–53.0)
HEMOGLOBIN: 10.2 g/dL — ABNORMAL LOW (ref 13.5–17.5)
LARGE UNSTAINED CELLS: 0 % (ref 0–4)
LYMPHOCYTES RELATIVE PERCENT: 9.8 %
MEAN CORPUSCULAR HEMOGLOBIN CONC: 32.7 g/dL (ref 31.0–37.0)
MEAN CORPUSCULAR HEMOGLOBIN: 26.1 pg (ref 26.0–34.0)
MEAN PLATELET VOLUME: 8.1 fL (ref 7.0–10.0)
MONOCYTES ABSOLUTE COUNT: 0.3 10*9/L (ref 0.2–0.8)
NEUTROPHILS ABSOLUTE COUNT: 11.4 10*9/L — ABNORMAL HIGH (ref 2.0–7.5)
NEUTROPHILS RELATIVE PERCENT: 87 %
PLATELET COUNT: 225 10*9/L (ref 150–440)
RED BLOOD CELL COUNT: 3.9 10*12/L — ABNORMAL LOW (ref 4.50–5.90)
RED CELL DISTRIBUTION WIDTH: 24.7 % — ABNORMAL HIGH (ref 12.0–15.0)
WBC ADJUSTED: 13.1 10*9/L — ABNORMAL HIGH (ref 4.5–11.0)

## 2019-08-12 LAB — MAGNESIUM: Magnesium:MCnc:Pt:Ser/Plas:Qn:: 2

## 2019-08-12 LAB — PHOSPHORUS: Phosphate:MCnc:Pt:Ser/Plas:Qn:: 3.3

## 2019-08-12 LAB — PROTEIN TOTAL: Protein:MCnc:Pt:Ser/Plas:Qn:: 7

## 2019-08-12 LAB — TACROLIMUS, TROUGH: Lab: 5.9

## 2019-08-12 LAB — SLIDE REVIEW

## 2019-08-12 LAB — SMEAR REVIEW

## 2019-08-12 LAB — HEMOGLOBIN: Hemoglobin:MCnc:Pt:Bld:Qn:: 10.2 — ABNORMAL LOW

## 2019-08-12 NOTE — Unmapped (Signed)
Argie's VSS on RA, remains afebrile throughout shift. R arm PICC C/D/I, infusing fluids. CBC and tac trough drawn. R PIV C/D/I, SL. Pt. Voiding well, brown urine. No BM noted. Pt drinking well with encouragement. Pt ate some bfast this morning, did not eat until later in afternoon. Not c/o of pain throughout shift. Pt nauseated after afternoon meds but refused intervention.  Thymoglogulin ordered in afternoon and is infusing currently. Pt tolerating well. Glucose checks continue ACHS. No family at bedside. Will ctm and follow POC.       Problem: Wound  Goal: Optimal Wound Healing  Outcome: Progressing     Problem: Adult Inpatient Plan of Care  Goal: Absence of Hospital-Acquired Illness or Injury  Outcome: Progressing     Problem: Adult Inpatient Plan of Care  Goal: Readiness for Transition of Care  Outcome: Progressing

## 2019-08-12 NOTE — Unmapped (Signed)
Pediatric Tacrolimus Therapeutic Monitoring Pharmacy Note  ??  [name]??Gross??is a 18 y.o.??male??continuing??tacrolimus.   ??  Indication:??Liver transplant??  ??  Date of Transplant: 09/22/2016??  ??  Prior Dosing Information: Tacrolimus 0.5mg  cap (0.008??mg/kg/DOSE) PO given x1 08/11/19 @ 0955  ??  Dosing Weight: 55.9 kg  ??  Goals:  Therapeutic Drug Levels  Tacrolimus trough goal: 5-7 ng/mL  ??  Additional Clinical Monitoring/Outcomes  ?? Monitor renal function (SCr and urine output) and liver function (LFTs)  ?? Monitor for signs/symptoms of adverse events (e.g., hyperglycemia, hyperkalemia, hypomagnesemia, hypertension, headache, tremor)  ??  Results:   Tacrolimus level:??5.9??ng/m. Slow clearance is likely due to drug accumulation/reduced clearance due to liver dysfunction.  ??  Longitudinal Dose Monitoring:  Date Dose (mg)  AM / PM AM Scr (mg/dL) Level  (ng/mL) Key Drug Interactions   08/12/19 --- / 0.25 mg 1.25 5.9 None   08/11/19 0.5 mg / --- ??1.26 ??10.1 ??None   08/10/19 1 mg / HOLD 1.4 10.5 None   08/09/19 1 mg / 1 mg 1.29 --- None   08/08/19 3 mg / HOLD 1.5 11.9 None   08/06/19 5 mg / 3 mg 0.8 9 None   ??  Pharmacokinetic Considerations and Significant Drug Interactions:  ?? Concurrent hepatotoxic medications: None identified  ?? Concurrent CYP3A4 substrates/inhibitors: None identified  ?? Concurrent nephrotoxic medications: None identified  ??  Assessment/Plan:  ?? Trough level within therapeutic range but has fallen markedly since 10/01    Recommendedation(s)  ?? Start tacrolimus 0.25 mg susp PO BID  ?? Order DAILY tacrolimus levels until consistently within goal range of 5-7  ?? Note, patient started on thymoglobulin and high dose steroids 08/11/19  ??  Follow-up  ?? Next level ordered:??08/13/19 at 0930 and same time daily.   ?? A pharmacist will continue to monitor and recommend levels as appropriate  ??  The above plan was discussed and approved by??Blenda Mounts, MD  ??  Please page service pharmacist with questions/clarifications. Mabeline Caras, PharmD, BCPPS

## 2019-08-12 NOTE — Unmapped (Signed)
Pediatric Palliative Care Consult Note:  ??  [name] Gross is a 18 y.o. male seen for pediatric palliative care consultation at the request of Dr. Curtis Sites for goals of care, advance care planning and family support  ??  Primary Care Provider: [name], MD  ??  History provided by: patient  ??  Assessment:  ??  [name] Gross is a 18 y.o. 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 His hospital course has been complicated by AKI, initially thought to be improving, now stabilized Cr. His liver function is not yet responding to the high dose steroids, and his MELD score of 23 is concerning with its associated ~20% mortality over 90 days. Primary team has discussed his lack of response to initial therapy, and is concerned he may require another liver transplant. Palliative care consult requested for goals of care, advance care planning and family support.  [name] Gross has limited social supports, and has cut himself off from most of his family. We have discussed the importance of him naming a surrogate decision maker, in the event he cannot make decisions for himself. He is considering naming a friend as a Runner, broadcasting/film/video and his maternal grandparents as designated care givers/source of housing.  He would benefit from family meeting to discuss prognosis and plan of care. We also suggested Castle think about having a support person at a meeting with his providers. We appreciate that Demarkus is seeking autonomy, however he needs supportive caregivers, and to identify a surrogate decision maker, during this time while he is critically ill, at risk for further decompensation.  ??  Summary of Discussion and Recommendations:   ??  1. SYMPTOMS:   - Nausea: currently on scheduled Protonix 40mg  daily, Pepcid 20mg  BID, Zofran 4mg  Q8hr sch; phenergan PRN  - Insomnia: Scheduled melatonin nightly  - Depression: Psych following, history of suicide attempt in Apirl 2019, denied SI to other providers during this hospital stay.   ??  2. GOALS:   - [name] Gross hopes to make a recovery, find housing, and find a job. He is particularly interested in help finding housing in the area.   - [name] Gross has long term goals which include going back to school and becoming a Runner, broadcasting/film/video or nurse.   ??  3. DECISIONS:   - Given risk of hepatic encephalopathy, and risk of his condition deteriorating, recommended [name] Gross think about who he would want to be his surrogate decision maker if he was unable to make decisions for himself (see ACP below)   - [name] Gross likes to receive information directly. He has been told that since his liver function has not responded to treatment to date that he is at risk for needing another liver transplant. [name] Gross is not sure if he would like to pursue a liver transplant, and is going to think about it.   - If he would like to pursue liver transplant, [name] Gross needs social supports including stable housing, transportation, etc to be determined. He is considering his maternal grandparents as care givers but is still thinking about this.   - Recommend family meeting next week.  - [facility] Decision Making Tool given and explained.   ??  4. ADVANCE CARE PLANNING  - [name] Gross of the Voicing My Choices booklet and offered to assist him with completing book if wanted/needed.Marland Kitchen  -  Provided [facility] decision making tool to help [name] Gross think about pursing another liver transplant.   - Recommended [name] Gross think about who he would want to be his surrogate decision maker if he was unable to make decisions for himself, he is considering choosing a friend. [name] Gross will think about this further and we will revisit.   - Code status: full code. - Prognosis: Guarded, his current MELD score of 23 has an estimated 3 month mortality rate of ~20%. Medical team concerned that his liver function did not respond to high dose steroids, currently being treated with thymoglobulin, which is not without significant risk  ??  5. SUPPORT:   - [name] Gross describes limited personal supports including an aunt and maternal grandparents, and friends. He has not spoken to any friends/family since being admitted to the hospital. He has chosen to leave his phone off but is open to turning it back on soon.   - Discussed hospital based resources including chaplains.  - [name] Gross agreed to begin SSI application as suggested by transplant social worker, as the process could take months to complete.  He was also told how to access his medical records.  ??  6. CARE COORDINATION:   - Care coordinated with primary team, nursing, child life, care manager, and transplant social worker.     - Recommend meeting with key medical providers and support person(s) of [name] Gross's choice as we learn more about his condition and treatment options. He is in agreement that this would be helpful. Planning for week of Oct 5th.   ??    Total time spent with patient for evaluation & management (excluding ACP documented separately): 75 minutes (10:30-11:45am).  Greater than 50% of this time spent on counseling/coordination of care: Yes.  See ACP Note from today for additional billable service: No, 0 minutes.        We appreciate the consult. The Children's Supportive Care Team can be reached by pager [name] Gross) or email ([email or web address]).   ??  ??  History of Present Illness:  Jobin??is a 18 y.o.??male??with history primary sclerosing cholangitis s/p liver transplant (2017) with multiple??epsiodes??of rejection, depression (history of suicide attempt via over dose in April 2019)??who??was admitted with reports of??black stools, abdominal pain, and icteric sclera??most likely due to acute liver transplant rejection/failure. His hospital course has been complicated by AKI. His liver function has not yet responded to pulse dose steroids, primary team concerned about overall prognosis.   old his primary team that he is having the symptoms.  He told our team it was okay to pass along this information to his primary team.  ??  Interval events: [name] Gross has continued to have nausea, however some improvement with scheduled Zofran. He has improved appetite, but notes that when he starts to eat he is full easily.   ??  During our visit, the liver transplant social worker stopped by, [name] Binning LCSW, and she informed the team that [name] Gross had been previously told that since his liver is not responding to current treatment there is a high likelihood that he would require another liver transplant.  [name] Gross was in agreement that he had been told this information before, during this hospital stay. [name] Gross explained that in order for him to be a candidate for another liver transplant he would need stable housing, reliable transportation, and he would need two designated caregivers.  [name] Gross had been previously thinking about his maternal grandparents as possible caregivers, and reported that they have reliable transportation, as well as a cousin  who can drive him sometimes.  His maternal grandparents live in [place].  [name] Gross shared that he had previous struggles with his family members who did not respect his privacy.  Particularly his paternal grandmother, who gave him a hard time about not eating enough food, being skinny, etc.  His paternal grandmother also pressured him to talk when he did not feel like talking.  There he says he is a very private person, has always been very private and does not like when his family does not respect his boundaries.    Our team talked to Savien about thinking more about whether he would even be interested in pursuing another liver transplant.  We also talked to him about thinking about who he would want his surrogate decision maker to be.  He mentioned that he has a friend who is in college who he is close with who he believes would be a good potential option for surrogate decision maker.  [name] Gross plans to reach out to this friend over the weekend.    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  He is not associated with any one religion, but is interested in spirituality, and is open to talking to a chaplain.       Allergies:  Bee pollen and Pollen extracts  ??  Medications:  Scheduled Meds:  ??? calcium carbonate  300 mg elem calcium Oral BID   ??? calcium carbonate  300 mg elem calcium Oral Once   ??? ergocalciferol  50,000 Units Oral Weekly   ??? famotidine  20 mg Oral Once   ??? flu vacc [id number] 6mos up(PF)  0.5 mL Intramuscular During hospitalization   ??? insulin lispro  0-2.5 Units Subcutaneous Nightly   ??? insulin lispro  0-5 Units Subcutaneous TID AC   ??? melatonin  3 mg Oral QPM   ??? multivitamin, with zinc  2 tablet Oral Daily   ??? mycophenolate  540 mg Oral BID   ??? ondansetron  4 mg Intravenous [facility]   ??? ondansetron  4 mg Intravenous Once   ??? pantoprazole  40 mg Oral Daily     Continuous Infusions:  ??? sodium chloride     ??? sodium chloride 0.45 % 95 mL/hr (08/12/19 0737)     PRN Meds:.dextrose 50 % in water (D50W), Implement **AND** Care order/instruction **AND** Vital signs **AND** Nursing oxygen orders / instructions **AND** sodium chloride **AND** sodium chloride 0.9% **AND** diphenhydrAMINE **AND** famotidine **AND** methylPREDNISolone sodium succinate (PF) **AND** EPINEPHrine, heparin, porcine (PF)    Physical Exam:   Vitals:    08/12/19 0745   BP: 99/50   Pulse: 69   Resp: 18   Temp: 36.8 ??C   SpO2: 99%   ??  Gen: Laying in bed, tired, nontoxic appearing. Appears to have more energy today relative to a few days ago.  HEENT: +scleral icterus.   Psych: Reduced range of affect, some moments of pressured speech, mental status appeared to be appropriate for age.    ??  Data: Reviewed test results in Epic  ??  Harold Hedge, MD, MPH  PGY-2 Pediatrics    [facility], [place], CPNP  Children's Supportive Care Team  [phone] (pager)        Collaborating Physician Attestation    I was the supervising physician in the delivery of the service. I discussed Brantly's case with [facility], CPNP and was involved with making the recommendations contained in this  note.     Waldon Reining, MD, MPH  Attending Physician, Children's Supportive Care Team

## 2019-08-12 NOTE — Unmapped (Signed)
Pediatric Daily Progress Note     Assessment/Plan:     Principal Problem:    Liver transplant rejection (CMS-HCC)  Active Problems:    Recurrent major depressive disorder, in partial remission (CMS-HCC)    Malnutrition of mild degree (CMS-HCC)    History of liver transplant (CMS-HCC)    Sickle cell trait (CMS-HCC)    Transaminitis    Anemia    Hypoalbuminemia    Cholangitis of transplanted liver (CMS-HCC)    Abdominal pain    Melena    Homeless    Dark stools  Resolved Problems:    * No resolved hospital problems. 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 He??requires care in the hospital for transplant rejection protocol treatment.   Overall, [name] Gross seemed to be doing and feeling better this morning. Will continue with his thymoglobulin regimen today. Social work and Palliative care have been seeing him frequently to continue to support him in planning for the future (living arrangements, medical decision makers, etc).     # Hx Liver Transplant - Subacute/Acute Liver Failure:  Pt has biopsy confirmed severe acute rejection. Liver biopsy completed 9/25; RAI 9/9  - GI/transplant following, appreciate recs   - Day 2 of 1.5 mg/kg Thymoglobulin with 1 gm MethylPred  - starting on Valgancyclovir 450mg  daily (3months) and Bactrim double strength tab MWF (42mo) for prophylaxis  - starting on Nystatin 10ml TID (25mo) prophylaxis  - New labs today: Liver/Kidney Microsome type 1 antibody, Anti-smooth muscle antibody, CD3 level (Mon)  - s/p Solumedrol 560 mg IV qd.   - Mycophenolate 540mg  bid    - Restarting Tacro as last level was within range. Will start at 0.25mg  BID tonight  - Daily tacro levels  - 3x Vit K injection 10 mg  - EBV pending, HIV negative, CMV viral load not detected  - Vit D level pending  - MELD score 24 - estimated 3 month mortality of 20%  - PICC line place 9/28    FEN/GI:??Abnormal electrolyte levels in setting of ongoing liver disease and recent kidney injury  - Monitor fluid status and daily weights closely.   -Sliding scale Lispro added for BG >200  -Zofran q8 hours scheduled  -Protonix 40 mg daily  -Regular diet   -Daily CMP, Mg, Ph  -giving Ergocalciferol   -providing ensures BID  -aquadex multivitamin      # AKI:  - Nephro following: consider 25% albumin  - Etiology uncertain, possibly 2/2 Liver disease, TAC kidney, poor PO intake, hypotension during biopsy.  - Creatinine remained stable today, around his baseline  - 1/2 NS 65ml/hr  - Encouraging PO intake (>1.6L of fluids PO/day)   - STRICT I/Os      # Abdominal Pain, dark stools:  Unclear etiology, as FOBT x2 negative.  One possibility is recent use of bismuth-containing medications.    - Perianal exam (10/1) within normal limits with mild perianal skin breakdown. No external hemorrhoids    #Severe Malnutrition: ??Prior to admission, patient has been skipping meals due to poor appetite and food insecurity.  - Nutrition consult, appreciate recs  -  Thiamine completed 9/26  - Multivitamin, Ca 300 mg BID, vit D 1000u QD   - Monitor phosphorous level. Replenish with neutrophos again today  - Repeat EKG later in admission  ??  # Major Depressive Disorder: Stable. Has hx of attempted overdoses, with the last attempt in April 2019 at which point he was hospitalized in the Adolescent Psychiatry unit and discharged on Lexapro 20 mg, melatonin 9 mg.  He has not engaged with mental health resources or taken medication for depression or insomnia in 8-10 months.  Denies SI, passive suicidality, self-harm, or HI at this time.   - Psych consulted   - Melatonin nightly  - c/s supportive care   ??  # Homelessness - Social Concerns:??[name] Gross has experienced homelessness for ~5 months when he left his mother's house.  He has severed all ties ties with his family and states his relationship with his mother cannot be reconciled.  He also requested contact information of family members to be erased from his chart (this has been completed, but social worker did drop contact info into her note on 9/24 in case he changes his mind during admission).  In addition to food insecurity, he has not been able to secure medications.  He is routinely exposed to alcohol and drugs (marijuana, cocaine, etc.) second-hand through his friendships though he denies current use himself.  On urine drug screen, he tested positive for marijuana only.  - Patient plans to be discharged to a local homeless shelter and begin job hunting  - SW will Chief Technology Officer for [employer] and homeless shelters in [place], [place], and [place]  -Supportive care following. Would like [name] Gross to work on naming a Runner, broadcasting/film/video in the event he cannot make decisions for himself.     Access:??PIV  ??  Discharge criteria:??liver transplant work-up and treatment of acute rejection      Subjective:     Interval History: No acute events overnight, no episodes of emesis. Was able to tolerate Thymoglobulin. Seemed to be in good spirits today, was watching youtube. Said he was feeling okay.      Objective:     Vital signs in last 24 hours:  Temp:  [36.4 ??C-36.9 ??C] 36.9 ??C  Heart Rate:  [66-92] 79  SpO2 Pulse:  [66-90] 81  Resp:  [13-27] 21  BP: (97-117)/(51-63) 111/55  MAP (mmHg):  [66-78] 71  SpO2:  [97 %-100 %] 99 %  Intake/Output last 3 shifts:  I/O last 3 completed shifts:  In: 3756 [P.O.:1493; I.V.:2263]  Out: 2301 [Urine:1750; Emesis/NG output:551]    Physical Exam:  Gen: Laying in bed watching Youtube, in no acute distress  Eyes: EOMI, scleral icterus  Heart: regular rate and rhythm. No murmurs/rubs/gallops appreciated.  Pulmonary: clear breath sounds bilaterally, no increased work of breathing. No wheezes/crackles/rales  Skin: no rashes  Abd: soft, mildly tender. Mildly distended with hepatomegaly appreciated.  Ext: full ROM in all extremities  Neuro: no focal neurological deficits noted.     Active Medications reviewed and KEY Medications include:       Current Facility-Administered Medications:   ???  calcium carbonate (OS-CAL) tablet 300 mg elem calcium, 300 mg elem calcium, Oral, BID, Anastasia Pall, MD, 300 mg elem calcium at 08/11/19 2232  ???  calcium carbonate (TUMS) chewable tablet 300 mg elem calcium, 300 mg elem calcium, Oral, Once, Anastasia Pall, MD  ???  Implement, , , Until Discontinued **AND** Care order/instruction, , , PRN **AND** Vital signs, , ,  PRN **AND** Nursing oxygen orders / instructions, , , PRN **AND** sodium chloride (NS) 0.9 % infusion, 20 mL/hr, Intravenous, Continuous PRN **AND** sodium chloride 0.9% (NS) bolus 1,000 mL, 1,000 mL, Intravenous, Daily PRN **AND** diphenhydrAMINE (BENADRYL) injection 25 mg, 25 mg, Intravenous, Q4H PRN **AND** famotidine (PEPCID) injection 20 mg, 20 mg, Intravenous, Q4H PRN **AND** methylPREDNISolone sodium succinate (PF) (Solu-MEDROL) injection 125 mg, 125 mg, Intravenous, Q4H PRN **AND** EPINEPHrine (EPIPEN) injection 0.3 mg, 0.3 mg, Intramuscular, Daily PRN, Almon Register, MD  ???  ergocalciferol (DRISDOL) capsule 50,000 Units, 50,000 Units, Oral, Weekly, Ejiofor A Lucky Cowboy., MD, 50,000 Units at 08/08/19 1214  ???  famotidine (PEPCID) oral suspension, 20 mg, Oral, Once, Anastasia Pall, MD  ???  famotidine (PEPCID) tablet 20 mg, 20 mg, Oral, BID, Val Eagle, 20 mg at 08/11/19 2014  ???  heparin preservative-free injection 10 units/mL syringe (HEPARIN LOCK FLUSH), 20 Units, Intravenous, Daily PRN, Anastasia Pall, MD, 20 Units at 08/10/19 1012  ???  influenza vaccine quad (FLUARIX, FLULAVAL, FLUZONE) (6 MOS & UP) 2020-21, 0.5 mL, Intramuscular, During hospitalization, Anastasia Pall, MD  ???  melatonin tablet 3 mg, 3 mg, Oral, QPM, Anastasia Pall, MD, 3 mg at 08/11/19 2231  ???  multivitamin, with zinc (AQUADEKS) chewable tablet, 2 tablet, Oral, Daily, Ejiofor A Lucky Cowboy., MD, 2 tablet at 08/11/19 1610  ???  mycophenolate (MYFORTIC) EC tablet 540 mg, 540 mg, Oral, BID, Anastasia Pall, MD, 540 mg at 08/11/19 2231  ???  ondansetron (ZOFRAN) injection 4 mg, 4 mg, Intravenous, Q8H SCH, Ejiofor A Lucky Cowboy., MD, 4 mg at 08/12/19 0459  ???  ondansetron (ZOFRAN) injection 4 mg, 4 mg, Intravenous, Once, Ejiofor A Lucky Cowboy., MD  ???  pantoprazole (PROTONIX) EC tablet 40 mg, 40 mg, Oral, Daily, Almon Register, MD, 40 mg at 08/11/19 2231  ???  sodium chloride 0.45% (1/2 NS) infusion, 95 mL/hr, Intravenous, Continuous, Ejiofor A Lucky Cowboy., MD, Last Rate: 95 mL/hr at 08/11/19 2248, 95 mL/hr at 08/11/19 2248        Studies: Personally reviewed and interpreted.  Labs/Studies:  Labs and Studies from the last 24hrs per EMR and Reviewed and   All lab results last 24 hours:      ========================================  Gerrie Nordmann, MD  [facility], PGY-1  [phone]      I supervised the resident physician on subsequent day care who spent at least 35 minutes on the floor or unit in direct patient care. The direct patient care time included face-to-face time with the patient, reviewing the patient's chart, communicating with the family and/or other professionals and coordinating care. Greater than 50% of this time was spent in counseling or coordinating care with the patient regarding continued treatment of liver rejection with day 2 of ATG (and associated prophylactic anti-infectives) and improving hydration to help turn around kidney injury. I was available throughout care provided.     Pablo Ledger MD, MPH, MMSc.  Attending Physician; Peds GI/Hepatology/Nutrition  [facility]

## 2019-08-12 NOTE — Unmapped (Signed)
Tivon's VSS on RA, remains afebrile. PICC C/D/I, infusing fluids. R PIV C/D/I, SL. Pt. Voiding well, no BM noted. Drinking well and increasing PO intake. Pt. Did not complain of pain or nausea/ vomiting overnight. Continued abdominal tenderness mainly on right side. Did not complain of tingling or burning in feet. Thymoglogulin complete and pt. Tolerated well. Glucose checks continue to stay in normal range. Pt. Resting well overnight, no family at bedside.     Problem: Adult Inpatient Plan of Care  Goal: Plan of Care Review  Outcome: Ongoing - Unchanged  Goal: Patient-Specific Goal (Individualization)  Outcome: Ongoing - Unchanged  Goal: Absence of Hospital-Acquired Illness or Injury  Outcome: Ongoing - Unchanged  Goal: Optimal Comfort and Wellbeing  Outcome: Ongoing - Unchanged  Goal: Readiness for Transition of Care  Outcome: Ongoing - Unchanged  Goal: Rounds/Family Conference  Outcome: Ongoing - Unchanged     Problem: Wound  Goal: Optimal Wound Healing  Outcome: Ongoing - Unchanged

## 2019-08-13 LAB — COMPREHENSIVE METABOLIC PANEL
ALBUMIN: 1.9 g/dL — ABNORMAL LOW (ref 3.5–5.0)
ALKALINE PHOSPHATASE: 1005 U/L — ABNORMAL HIGH (ref 65–260)
ANION GAP: 3 mmol/L — ABNORMAL LOW (ref 7–15)
AST (SGOT): 331 U/L — ABNORMAL HIGH (ref 19–55)
BILIRUBIN TOTAL: 16.3 mg/dL — ABNORMAL HIGH (ref 0.0–1.2)
BLOOD UREA NITROGEN: 31 mg/dL — ABNORMAL HIGH (ref 7–21)
BUN / CREAT RATIO: 24
CALCIUM: 7.4 mg/dL — ABNORMAL LOW (ref 8.5–10.2)
CHLORIDE: 109 mmol/L — ABNORMAL HIGH (ref 98–107)
CO2: 26 mmol/L (ref 22.0–30.0)
CREATININE: 1.28 mg/dL (ref 0.70–1.30)
EGFR CKD-EPI AA MALE: >90 mL/min/{1.73_m2} (ref >=60)
GLUCOSE RANDOM: 163 mg/dL (ref 70–179)
POTASSIUM: 5.1 mmol/L — ABNORMAL HIGH (ref 3.5–5.0)
PROTEIN TOTAL: 6.4 g/dL — ABNORMAL LOW (ref 6.5–8.3)
SODIUM: 138 mmol/L (ref 135–145)

## 2019-08-13 LAB — CBC W/ AUTO DIFF
BASOPHILS ABSOLUTE COUNT: 0 10*9/L (ref 0.0–0.1)
BASOPHILS RELATIVE PERCENT: 0.1 %
EOSINOPHILS ABSOLUTE COUNT: 0 10*9/L (ref 0.0–0.4)
EOSINOPHILS RELATIVE PERCENT: 0.1 %
HEMATOCRIT: 30.1 % — ABNORMAL LOW (ref 41.0–53.0)
HEMOGLOBIN: 9.7 g/dL — ABNORMAL LOW (ref 13.5–17.5)
LARGE UNSTAINED CELLS: 1 % (ref 0–4)
LYMPHOCYTES ABSOLUTE COUNT: 0.9 10*9/L — ABNORMAL LOW (ref 1.5–5.0)
LYMPHOCYTES RELATIVE PERCENT: 9.9 %
MEAN CORPUSCULAR HEMOGLOBIN CONC: 32.3 g/dL (ref 31.0–37.0)
MEAN CORPUSCULAR HEMOGLOBIN: 25.5 pg — ABNORMAL LOW (ref 26.0–34.0)
MEAN CORPUSCULAR VOLUME: 78.9 fL — ABNORMAL LOW (ref 80.0–100.0)
MEAN PLATELET VOLUME: 7.1 fL (ref 7.0–10.0)
MONOCYTES ABSOLUTE COUNT: 0.3 10*9/L (ref 0.2–0.8)
MONOCYTES RELATIVE PERCENT: 3.6 %
NEUTROPHILS RELATIVE PERCENT: 85.6 %
PLATELET COUNT: 211 10*9/L (ref 150–440)
RED BLOOD CELL COUNT: 3.81 10*12/L — ABNORMAL LOW (ref 4.50–5.90)
RED CELL DISTRIBUTION WIDTH: 23.8 % — ABNORMAL HIGH (ref 12.0–15.0)
WBC ADJUSTED: 9.2 10*9/L (ref 4.5–11.0)

## 2019-08-13 LAB — BASOPHILS ABSOLUTE COUNT: Lab: 0

## 2019-08-13 LAB — MAGNESIUM: Magnesium:MCnc:Pt:Ser/Plas:Qn:: 2.1

## 2019-08-13 LAB — CALCIUM: Calcium:MCnc:Pt:Ser/Plas:Qn:: 7.4 — ABNORMAL LOW

## 2019-08-13 LAB — TACROLIMUS LEVEL, TROUGH: TACROLIMUS, TROUGH: 5.5 ng/mL (ref 5.0–15.0)

## 2019-08-13 LAB — TACROLIMUS, TROUGH: Lab: 5.5

## 2019-08-13 LAB — GAMMAGLOBULIN; IGG: IgG:MCnc:Pt:Ser/Plas:Qn:: 3407 — ABNORMAL HIGH

## 2019-08-13 LAB — PHOSPHORUS: Phosphate:MCnc:Pt:Ser/Plas:Qn:: 3.7

## 2019-08-13 LAB — RETICULOCYTES: RETIC HGB CONTENT: 32.1 pg (ref 29.7–36.1)

## 2019-08-13 LAB — RETICULOCYTE ABSOLUTE COUNT: Lab: 117.6

## 2019-08-13 LAB — GAMMA GLUTAMYL TRANSFERASE: Gamma glutamyl transferase:CCnc:Pt:Ser/Plas:Qn:: 357 — ABNORMAL HIGH

## 2019-08-13 NOTE — Unmapped (Signed)
Pediatric Tacrolimus Therapeutic Monitoring Pharmacy Note  ??  [name]??Gross??is a 18 y.o.??male??continuing??tacrolimus.   ??  Indication:??Liver transplant??  ??  Date of Transplant: 09/22/2016??  ??  Current Dosing Information: Tacrolimus 0.25 mg suspentison (~0.004??mg/kg/DOSE) PO q12h  ??  Dosing Weight: 55.9 kg  ??  Goals:  Therapeutic Drug Levels  Tacrolimus trough goal: 5-7 ng/mL  ??  Additional Clinical Monitoring/Outcomes  ?? Monitor renal function (SCr and urine output) and liver function (LFTs)  ?? Monitor for signs/symptoms of adverse events (e.g., hyperglycemia, hyperkalemia, hypomagnesemia, hypertension, headache, tremor)  ??  Results:   Tacrolimus level:??5.5??ng/mL. Level drawn ~12.2 hours after the previous dose, true trough may be slightly higher. Slow clearance is likely due to drug accumulation/reduced clearance due to liver dysfunction.  ??  Longitudinal Dose Monitoring:  Date Dose (mg)  AM / PM AM Scr (mg/dL) Level  (ng/mL) Key Drug Interactions   08/13/19 0.25 mg/0.25 mg 1.28 5.5 None   08/12/19 --- / 0.25 mg 1.25 5.9 None   08/11/19 0.5 mg / --- ??1.26 ??10.1 ??None   08/10/19 1 mg / HOLD 1.4 10.5 None   08/09/19 1 mg / 1 mg 1.29 --- None   08/08/19 3 mg / HOLD 1.5 11.9 None   08/06/19 5 mg / 3 mg 0.8 9 None   ??  Pharmacokinetic Considerations and Significant Drug Interactions:  ?? Concurrent hepatotoxic medications: None identified  ?? Concurrent CYP3A4 substrates/inhibitors: None identified  ?? Concurrent nephrotoxic medications: Valganciclovir, SMP/TMX,   ??  Assessment/Plan:  ?? Trough level within therapeutic range, but tomorrow's level may be more representative of true steady state    Recommendedation(s)  ?? Continue tacrolimus 0.25 mg suspension PO BID  ?? Order DAILY tacrolimus levels until consistently within goal range of 5-7  ?? Note, patient started on thymoglobulin and high dose steroids 08/11/19  ??  Follow-up  ?? Next level ordered:??08/14/19 at 0930 and same time daily.   ?? A pharmacist will continue to monitor and recommend levels as appropriate  ??  Please page service pharmacist with questions/clarifications.    Kerman Passey, PharmD  Pediatric Clinical Pharmacist   [phone] office: [phone]

## 2019-08-13 NOTE — Unmapped (Signed)
Pt remained afebrile, VSS throughout shift. No complaints of pain or nausea. R PICC c/d/I, labs drawn, infusing IVF as ordered. ATG completed this shift, tolerated well. Good PO intake, pt voiding in toilet, asked to save in urinal w future voids. BG of 191 before bed, ordered for 0.5 units of insulin, MD notified of inability to find syringe capable of safely administering dose, pharmacy called. Per MD hold insulin dose, will discuss dosing in AM. Resting comfortably. Will continue to monitor.   Problem: Adult Inpatient Plan of Care  Goal: Plan of Care Review  Outcome: Ongoing - Unchanged  Goal: Patient-Specific Goal (Individualization)  Outcome: Ongoing - Unchanged  Goal: Absence of Hospital-Acquired Illness or Injury  Outcome: Ongoing - Unchanged  Goal: Optimal Comfort and Wellbeing  Outcome: Ongoing - Unchanged  Goal: Readiness for Transition of Care  Outcome: Ongoing - Unchanged  Goal: Rounds/Family Conference  Outcome: Ongoing - Unchanged     Problem: Wound  Goal: Optimal Wound Healing  Outcome: Ongoing - Unchanged

## 2019-08-13 NOTE — Unmapped (Signed)
Pediatric Daily Progress Note     Assessment/Plan:     Principal Problem:    Liver transplant rejection (CMS-HCC)  Active Problems:    Recurrent major depressive disorder, in partial remission (CMS-HCC)    Malnutrition of mild degree (CMS-HCC)    History of liver transplant (CMS-HCC)    Sickle cell trait (CMS-HCC)    Transaminitis    Anemia    Hypoalbuminemia    Cholangitis of transplanted liver (CMS-HCC)    Abdominal pain    Melena    Homeless    Dark stools  Resolved Problems:    * No resolved hospital problems. 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   Overall, Rafeal seemed to be doing and feeling better this morning. Will continue with his thymoglobulin regimen today. Social work and Palliative care have been seeing him frequently to continue to support him in planning for the future (living arrangements, medical decision makers, etc).   ??  # Hx Liver Transplant - Subacute/Acute Liver Failure: ??Pt has biopsy confirmed severe acute rejection.??Liver biopsy??completed??9/25;??RAI 9/9  - GI/transplant following, appreciate recs   - Day 3 of 1.5 mg/kg Thymoglobulin with 1 gm MethylPred  - started on Valgancyclovir 450mg  daily (3months) and Bactrim MWF (35mo) for prophylaxis (10/2)  - started on Nystatin 10ml TID (71mo) prophylaxis (10/2)  - pending labs sent yesterday: Liver/Kidney Microsome type 1 antibody, Anti-smooth muscle antibody, CD3 level (Mon)  - s/p Solumedrol 560 mg IV qd (9/27-9/30)and solumedrol 500 mg qd (9/26)  - Mycophenolate 540mg  bid    - Restarting Tacro as last level was within range. Will start at 0.25mg  BID tonight  - Daily tacro levels  - 3x??Vit K injection 10 mg  - EBV pending, HIV negative, CMV viral load not detected  - Vit D level pending  - MELD score 24 - estimated 3 month mortality of 20%  - PICC line place 9/28  - AM coags if prolonged will do factor V and VII levels  ??  FEN/GI:??Abnormal electrolyte levels in setting of ongoing liver disease and recent kidney injury  - Monitor fluid status and daily weights closely.   - sensitive Sliding scale Lispro added for BG >200  -Zofran??q8 hours scheduled  -Protonix 40 mg daily  -Regular diet??  -Daily CMP, Mg, Ph  -giving Ergocalciferol   -providing ensures BID  -aquadex multivitamin    ??  # AKI:  - Nephro following:  5% albumin (25g) today with afternoon weight check  - Etiology uncertain, possibly 2/2 Liver disease, TAC kidney, poor PO intake, hypotension during biopsy.  - Creatinine remained stable today, around his baseline  - 1/2 NS 38ml/hr  - Encouraging PO intake (>1.6L of fluids PO/day)   - STRICT I/Os    ??  # Abdominal Pain, dark stools: ??Unclear etiology, as FOBT x2 negative. ??One possibility is recent use of bismuth-containing medications. ??  - Perianal exam (10/1) within normal limits with mild perianal skin breakdown. No external hemorrhoids    #Severe Malnutrition:????Prior to admission, patient has been skipping meals due to poor appetite and  food insecurity.  - Nutrition consult, appreciate recs  - Thiamine completed 9/26  - Multivitamin, Ca 300 mg BID, vit D 1000u QD   - Monitor phosphorous level.  - Repeat EKG later in admission  ??  # Major Depressive Disorder: Stable. Has hx of attempted overdoses, with the last attempt in April 2019??at which point he was hospitalized in the Adolescent Psychiatry unit and discharged on Lexapro 20 mg, melatonin 9 mg. ??He has not engaged with mental health resources or taken medication for depression or insomnia in 8-10 months.????Denies SI, passive suicidality, self-harm, or HI at this time.   - Psych consulted??  - Melatonin nightly  - c/s supportive care   ??  # Homelessness - Social Concerns:??Thaddeaus has experienced homelessness for ~5 months when he left his mother's house. ??He has severed all ties ties with his family and states his relationship with his mother cannot be reconciled. ??He also requested contact information of family members to be erased from his chart (this has been completed, but social worker did drop contact info into her note on 9/24 in case he changes his mind during admission). ??In addition to food insecurity, he has not been able to secure medications. ??He is routinely exposed to alcohol and drugs (marijuana, cocaine, etc.) second-hand through his friendships though he denies current use himself. ??On urine drug screen, he tested positive for marijuana only.  - Patient plans to be discharged to a local homeless shelter and begin job hunting  - SW will Chief Technology Officer for [employer] and homeless shelters in [place], [place], and [employer]  -Supportive care following. Would like [place] to work on naming a Runner, broadcasting/film/video in the event he cannot make decisions for himself.   ??  Access:??PIV  ??  Discharge criteria:??liver transplant work-up and treatment of acute rejection    Subjective:     Interval History: No acute events overnight, received his second dose of ATG without issue.     Objective:     Vital signs in last 24 hours:  Temp:  [36.3 ??C-37.1 ??C] 36.9 ??C  Heart Rate:  [65-83] 75  SpO2 Pulse:  [64-83] 66  Resp:  [15-27] 20  BP: (99-116)/(48-68) 116/53  MAP (mmHg):  [65-80] 72  SpO2:  [98 %-100 %] 100 %  Intake/Output last 3 shifts:  I/O last 3 completed shifts:  In: 4791.9 [P.O.:2580; I.V.:1654.9; IV Piggyback:557]  Out: 3450 [Urine:3450]    Physical Exam:  General:   Sitting comfortably in bed in no acute distress   Head:  atraumatic and normocephalic  Eyes:   sclera icteric  Nose:   clear, no discharge  Lungs:   clear to auscultation, no wheezing, crackles or rhonchi, breathing unlabored  Heart:  regular rate and rhythm, no murmurs or gallops.  Abdomen:   Abdomen soft, tender to palpation of both right upper and lower quadrants.  BS normal.   Neuro:   normal without focal findings  Extremities:   moves all extremities equally, 1+ edema bilateral feet   Skin:   warm, no rashes, no ecchymosis    Active Medications reviewed and KEY Medications include:     Current Facility-Administered Medications:   ???  albumin human 5 % bottle 25 g, 25 g, Intravenous, Once, Bernadean Saling A Lucky Cowboy., MD  ???  calcium carbonate (OS-CAL) tablet 300 mg elem calcium, 300 mg elem calcium, Oral, BID, Anastasia Pall, MD, 300 mg elem calcium at 08/12/19 2142  ???  calcium carbonate (TUMS) chewable  tablet 300 mg elem calcium, 300 mg elem calcium, Oral, Once, Anastasia Pall, MD  ???  dextrose 50 % in water (D50W) 50 % solution 12.5 g, 12.5 g, Intravenous, Q10 Min PRN, Siren Porrata A Lucky Cowboy., MD  ???  Implement, , , Until Discontinued **AND** Care order/instruction, , , PRN **AND** Vital signs, , , PRN **AND** Nursing oxygen orders / instructions, , , PRN **AND** sodium chloride (NS) 0.9 % infusion, 20 mL/hr, Intravenous, Continuous PRN **AND** sodium chloride 0.9% (NS) bolus 1,000 mL, 1,000 mL, Intravenous, Daily PRN **AND** diphenhydrAMINE (BENADRYL) injection 25 mg, 25 mg, Intravenous, Q4H PRN **AND** famotidine (PEPCID) injection 20 mg, 20 mg, Intravenous, Q4H PRN **AND** methylPREDNISolone sodium succinate (PF) (Solu-MEDROL) injection 125 mg, 125 mg, Intravenous, Q4H PRN **AND** EPINEPHrine (EPIPEN) injection 0.3 mg, 0.3 mg, Intramuscular, Daily PRN, Almon Register, MD  ???  ergocalciferol (DRISDOL) capsule 50,000 Units, 50,000 Units, Oral, Weekly, Cristal Qadir A Lucky Cowboy., MD, 50,000 Units at 08/08/19 1214  ???  famotidine (PEPCID) oral suspension, 20 mg, Oral, Once, Anastasia Pall, MD  ???  heparin preservative-free injection 10 units/mL syringe (HEPARIN LOCK FLUSH), 20 Units, Intravenous, Daily PRN, Anastasia Pall, MD, 20 Units at 08/10/19 1012  ???  influenza vaccine quad (FLUARIX, FLULAVAL, FLUZONE) (6 MOS & UP) 2020-21, 0.5 mL, Intramuscular, During hospitalization, Anastasia Pall, MD  ???  insulin lispro (HumaLOG) injection 0-2.5 Units, 0-2.5 Units, Subcutaneous, Nightly, Madine Sarr A Lucky Cowboy., MD  ???  insulin lispro (HumaLOG) injection 0-5 Units, 0-5 Units, Subcutaneous, TID AC, Jubal Rademaker A Lucky Cowboy., MD, 1 Units at 08/12/19 2001  ???  melatonin tablet 3 mg, 3 mg, Oral, QPM, Anastasia Pall, MD, 3 mg at 08/12/19 2141  ???  multivitamin, with zinc (AQUADEKS) chewable tablet, 2 tablet, Oral, Daily, Loren Sawaya A Lucky Cowboy., MD, 2 tablet at 08/12/19 1610  ???  mycophenolate (MYFORTIC) EC tablet 540 mg, 540 mg, Oral, BID, Anastasia Pall, MD, 540 mg at 08/12/19 2141  ???  nystatin (MYCOSTATIN) oral suspension, 1,000,000 Units, Oral, TID, Anastasia Pall, MD, 1,000,000 Units at 08/12/19 2025  ???  ondansetron (ZOFRAN) injection 4 mg, 4 mg, Intravenous, Q8H SCH, Lam Mccubbins A Lucky Cowboy., MD, 4 mg at 08/13/19 0437  ???  ondansetron (ZOFRAN) injection 4 mg, 4 mg, Intravenous, Once, Euline Kimbler A Lucky Cowboy., MD  ???  pantoprazole (PROTONIX) EC tablet 40 mg, 40 mg, Oral, Daily, Almon Register, MD, 40 mg at 08/12/19 2142  ???  sodium chloride 0.45% (1/2 NS) infusion, 45 mL/hr, Intravenous, Continuous, Rayhaan Huster A Lucky Cowboy., MD, Last Rate: 45 mL/hr at 08/12/19 1206, 45 mL/hr at 08/12/19 1206  ???  [START ON 08/15/2019] sulfamethoxazole-trimethoprim (BACTRIM) 400-80 mg tablet 80 mg of trimethoprim, 1 tablet, Oral, Q MWF, Maleki Hippe A Lucky Cowboy., MD  ???  tacrolimus (PROGRAF) oral suspension, 0.25 mg, Oral, BID, Almon Register, MD, 0.25 mg at 08/12/19 2141  ???  valGANciclovir (VALCYTE) tablet 450 mg, 450 mg, Oral, Daily, Almon Register, MD, 450 mg at 08/12/19 1706        Studies: Personally reviewed and interpreted.  Labs/Studies:  Labs and Studies from the last 24hrs per EMR and Reviewed and   All lab results last 24 hours:    Recent Results (from the past 24 hour(s))   Tacrolimus Level, Trough    Collection Time: 08/12/19 10:02 AM   Result Value Ref Range    Tacrolimus, Trough 5.9 5.0 - 15.0 ng/mL   CBC w/ Differential    Collection Time:  08/12/19 10:02 AM   Result Value Ref Range    WBC 13.1 (H) 4.5 - 11.0 10*9/L    RBC 3.90 (L) 4.50 - 5.90 10*12/L    HGB 10.2 (L) 13.5 - 17.5 g/dL    HCT 16.1 (L) 09.6 - 53.0 %    MCV 79.8 (L) 80.0 - 100.0 fL    MCH 26.1 26.0 - 34.0 pg    MCHC 32.7 31.0 - 37.0 g/dL    RDW 04.5 (H) 40.9 - 15.0 %    MPV 8.1 7.0 - 10.0 fL    Platelet 225 150 - 440 10*9/L    Neutrophils % 87.0 %    Lymphocytes % 9.8 %    Monocytes % 2.6 %    Eosinophils % 0.1 %    Basophils % 0.1 %    Neutrophil Left Shift 1+ (A) Not Present    Absolute Neutrophils 11.4 (H) 2.0 - 7.5 10*9/L    Absolute Lymphocytes 1.3 (L) 1.5 - 5.0 10*9/L    Absolute Monocytes 0.3 0.2 - 0.8 10*9/L    Absolute Eosinophils 0.0 0.0 - 0.4 10*9/L    Absolute Basophils 0.0 0.0 - 0.1 10*9/L    Large Unstained Cells 0 0 - 4 %    Microcytosis Marked (A) Not Present    Macrocytosis Slight (A) Not Present    Anisocytosis Marked (A) Not Present    Hypochromasia Slight (A) Not Present   Morphology Review    Collection Time: 08/12/19 10:02 AM   Result Value Ref Range    Smear Review Comments See Comment (A) Undefined    Target Cells Moderate (A) Not Present    Poikilocytosis Moderate (A) Not Present   POCT Glucose    Collection Time: 08/12/19  6:56 PM   Result Value Ref Range    Glucose, POC 156 70 - 179 mg/dL   POCT Glucose    Collection Time: 08/12/19 11:41 PM   Result Value Ref Range    Glucose, POC 191 (H) 70 - 179 mg/dL   Magnesium Level    Collection Time: 08/13/19  4:35 AM   Result Value Ref Range    Magnesium 2.1 1.6 - 2.2 mg/dL   Phosphorus Level    Collection Time: 08/13/19  4:35 AM   Result Value Ref Range    Phosphorus 3.7 2.9 - 4.7 mg/dL   Comprehensive Metabolic Panel    Collection Time: 08/13/19  4:35 AM   Result Value Ref Range    Sodium 138 135 - 145 mmol/L    Potassium 5.1 (H) 3.5 - 5.0 mmol/L    Chloride 109 (H) 98 - 107 mmol/L    Anion Gap 3 (L) 7 - 15 mmol/L    CO2 26.0 22.0 - 30.0 mmol/L    BUN 31 (H) 7 - 21 mg/dL    Creatinine 8.11 9.14 - 1.30 mg/dL    BUN/Creatinine Ratio 24     EGFR CKD-EPI Non-African American, Male 81 >=60 mL/min/1.60m2    EGFR CKD-EPI African American, Male >90 >=60 mL/min/1.56m2    Glucose 163 70 - 179 mg/dL    Calcium 7.4 (L) 8.5 - 10.2 mg/dL    Albumin 1.9 (L) 3.5 - 5.0 g/dL    Total Protein 6.4 (L) 6.5 - 8.3 g/dL    Total Bilirubin 78.2 (H) 0.0 - 1.2 mg/dL    AST 956 (H) 19 - 55 U/L    ALT 480 (H) <50 U/L    Alkaline Phosphatase 1,005 (H) 65 - 260  U/L   IgG    Collection Time: 08/13/19  4:35 AM   Result Value Ref Range    Total IgG 3,407 (H) 600-1,700 mg/dL   Gamma GT (GGT)    Collection Time: 08/13/19  4:35 AM   Result Value Ref Range    GGT 357 (H) 12 - 109 U/L   CBC w/ Differential    Collection Time: 08/13/19  4:36 AM   Result Value Ref Range    WBC 9.2 4.5 - 11.0 10*9/L    RBC 3.81 (L) 4.50 - 5.90 10*12/L    HGB 9.7 (L) 13.5 - 17.5 g/dL    HCT 29.5 (L) 62.1 - 53.0 %    MCV 78.9 (L) 80.0 - 100.0 fL    MCH 25.5 (L) 26.0 - 34.0 pg    MCHC 32.3 31.0 - 37.0 g/dL    RDW 30.8 (H) 65.7 - 15.0 %    MPV 7.1 7.0 - 10.0 fL    Platelet 211 150 - 440 10*9/L    Neutrophils % 85.6 %    Lymphocytes % 9.9 %    Monocytes % 3.6 %    Eosinophils % 0.1 %    Basophils % 0.1 %    Neutrophil Left Shift 1+ (A) Not Present    Absolute Neutrophils 7.8 (H) 2.0 - 7.5 10*9/L    Absolute Lymphocytes 0.9 (L) 1.5 - 5.0 10*9/L    Absolute Monocytes 0.3 0.2 - 0.8 10*9/L    Absolute Eosinophils 0.0 0.0 - 0.4 10*9/L    Absolute Basophils 0.0 0.0 - 0.1 10*9/L    Large Unstained Cells 1 0 - 4 %    Microcytosis Marked (A) Not Present    Anisocytosis Marked (A) Not Present     ========================================  Chasitee Zenker Ezekwe Jr. MD PhD  Pediatrics PGY3  Pager: [phone]

## 2019-08-13 NOTE — Unmapped (Signed)
[name] Gross' creatinine is stable but fails to improve. Looking back, it appears he has had multiple episodes of AKI which may make his recovery from this episode of ATN slower than one would expect with healthy underlying kidneys.     One consideration might be infusion of 5% albumin to improve oncotic pressure and promote perfusion of the kidneys.     Avoiding further insults, I continue to expect his creatinine to gradually improve, but it may take some time to do so.

## 2019-08-14 LAB — COMPREHENSIVE METABOLIC PANEL
ALBUMIN: 2.3 g/dL — ABNORMAL LOW (ref 3.5–5.0)
ALKALINE PHOSPHATASE: 986 U/L — ABNORMAL HIGH (ref 65–260)
ALT (SGPT): 476 U/L — ABNORMAL HIGH (ref <50)
ANION GAP: 5 mmol/L — ABNORMAL LOW (ref 7–15)
BLOOD UREA NITROGEN: 27 mg/dL — ABNORMAL HIGH (ref 7–21)
BUN / CREAT RATIO: 25
CALCIUM: 7.9 mg/dL — ABNORMAL LOW (ref 8.5–10.2)
CHLORIDE: 111 mmol/L — ABNORMAL HIGH (ref 98–107)
CO2: 24 mmol/L (ref 22.0–30.0)
CREATININE: 1.06 mg/dL (ref 0.70–1.30)
EGFR CKD-EPI AA MALE: >90 mL/min/{1.73_m2} (ref >=60)
EGFR CKD-EPI NON-AA MALE: >90 mL/min/{1.73_m2} (ref >=60)
GLUCOSE RANDOM: 185 mg/dL — ABNORMAL HIGH (ref 70–179)
POTASSIUM: 5 mmol/L (ref 3.5–5.0)
PROTEIN TOTAL: 6.3 g/dL — ABNORMAL LOW (ref 6.5–8.3)
SODIUM: 140 mmol/L (ref 135–145)

## 2019-08-14 LAB — CBC W/ AUTO DIFF
BASOPHILS ABSOLUTE COUNT: 0 10*9/L (ref 0.0–0.1)
BASOPHILS RELATIVE PERCENT: 0.1 %
EOSINOPHILS ABSOLUTE COUNT: 0 10*9/L (ref 0.0–0.4)
EOSINOPHILS RELATIVE PERCENT: 0.1 %
HEMATOCRIT: 27.8 % — ABNORMAL LOW (ref 41.0–53.0)
HEMOGLOBIN: 9.5 g/dL — ABNORMAL LOW (ref 13.5–17.5)
LARGE UNSTAINED CELLS: 1 % (ref 0–4)
LYMPHOCYTES RELATIVE PERCENT: 15 %
MEAN CORPUSCULAR HEMOGLOBIN CONC: 34 g/dL (ref 31.0–37.0)
MEAN CORPUSCULAR HEMOGLOBIN: 27.1 pg (ref 26.0–34.0)
MEAN CORPUSCULAR VOLUME: 79.8 fL — ABNORMAL LOW (ref 80.0–100.0)
MEAN PLATELET VOLUME: 7.9 fL (ref 7.0–10.0)
MONOCYTES ABSOLUTE COUNT: 0.3 10*9/L (ref 0.2–0.8)
MONOCYTES RELATIVE PERCENT: 3.9 %
NEUTROPHILS ABSOLUTE COUNT: 5.5 10*9/L (ref 2.0–7.5)
NEUTROPHILS RELATIVE PERCENT: 79.7 %
PLATELET COUNT: 197 10*9/L (ref 150–440)
RED BLOOD CELL COUNT: 3.48 10*12/L — ABNORMAL LOW (ref 4.50–5.90)
RED CELL DISTRIBUTION WIDTH: 24.5 % — ABNORMAL HIGH (ref 12.0–15.0)
WBC ADJUSTED: 6.9 10*9/L (ref 4.5–11.0)

## 2019-08-14 LAB — BASOPHILS ABSOLUTE COUNT: Lab: 0

## 2019-08-14 LAB — PHOSPHORUS: Phosphate:MCnc:Pt:Ser/Plas:Qn:: 3.1

## 2019-08-14 LAB — PROTIME: Coagulation tissue factor induced:Time:Pt:PPP:Qn:Coag: 15.7 — ABNORMAL HIGH

## 2019-08-14 LAB — APTT: Coagulation surface induced:Time:Pt:PPP:Qn:Coag: 25 — ABNORMAL LOW

## 2019-08-14 LAB — TACROLIMUS, TROUGH: Lab: 4.4 — ABNORMAL LOW

## 2019-08-14 LAB — MAGNESIUM: Magnesium:MCnc:Pt:Ser/Plas:Qn:: 2.1

## 2019-08-14 LAB — CREATININE: Creatinine:MCnc:Pt:Ser/Plas:Qn:: 1.06

## 2019-08-14 LAB — GAMMA GLUTAMYL TRANSFERASE: Gamma glutamyl transferase:CCnc:Pt:Ser/Plas:Qn:: 399 — ABNORMAL HIGH

## 2019-08-14 NOTE — Unmapped (Signed)
Pediatric Daily Progress Note     Assessment/Plan:     Principal Problem:    Liver transplant rejection (CMS-HCC)  Active Problems:    Recurrent major depressive disorder, in partial remission (CMS-HCC)    Malnutrition of mild degree (CMS-HCC)    History of liver transplant (CMS-HCC)    Sickle cell trait (CMS-HCC)    Transaminitis    Anemia    Hypoalbuminemia    Cholangitis of transplanted liver (CMS-HCC)    Abdominal pain    Melena    Homeless    Dark stools  Resolved Problems:    * No resolved hospital problems. 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 Overall, labs have been trending in the right direction and AKI seems to be improving with albumin infusion. Will continue with Thymoglobulin regimen.   ??  # Hx Liver Transplant - Subacute/Acute Liver Failure: ??Pt has biopsy confirmed severe acute rejection.??Liver biopsy??completed??9/25;??RAI 9/9  - GI/transplant following, appreciate recs   - Day 4 of 1.5 mg/kg Thymoglobulin with 500 gm MethylPred  - started on Valgancyclovir 450mg  daily (3months) and Bactrim MWF (74mo) for prophylaxis (10/2)  - started on Nystatin 10ml TID (51mo) prophylaxis (10/2)  - pending labs: Liver/Kidney Microsome type 1 antibody, Anti-smooth muscle antibody  - s/p Solumedrol 560 mg IV qd (9/27-9/30)and solumedrol 500 mg qd (9/26)  - Mycophenolate 540mg  bid    - 0.25mg  BID [place]. Daily troughs  - 3x??Vit K injection 10 mg  - EBV pending, HIV negative, CMV viral load not detected  - Vit D level pending  - MELD score 24 - estimated 3 month mortality of 20%  - PICC line place 9/28  - AM coags if prolonged will do factor V and VII levels  ??  FEN/GI:??Abnormal electrolyte levels in setting of ongoing liver disease and recent kidney injury  - Monitor fluid status and daily weights closely.   - sensitive Sliding scale Lispro added for BG >200  -Zofran??q8 hours scheduled  -Protonix 40 mg daily  -Regular diet??  -Daily CMP, Mg, Ph  -giving Ergocalciferol   -providing ensures BID  -aquadex multivitamin    ??  # AKI:  - Nephro following:    - 2nd dose of 5% albumin (25g) today  - Improved creatinine to 1.06  - Etiology uncertain, possibly 2/2 Liver disease, TAC kidney, poor PO intake, hypotension during biopsy.  - Encouraging PO intake (>1.6L of fluids PO/day)   - STRICT I/Os    ??  # Abdominal Pain, dark stools: ??Unclear etiology, as FOBT x2 negative. ??One possibility is recent use of bismuth-containing medications. ??  - Perianal exam (10/1) within normal limits with mild perianal skin breakdown. No external hemorrhoids    #Severe Malnutrition:????Prior to admission, patient has been skipping meals due to poor appetite and food insecurity.  - Nutrition consult, appreciate recs  - Thiamine completed 9/26  - Multivitamin, Ca 300 mg BID, vit D 1000u QD   - Monitor phosphorous level.  - Repeat EKG later in admission  ??  # Major Depressive Disorder: Stable. Has hx of  attempted overdoses, with the last attempt in April 2019??at which point he was hospitalized in the Adolescent Psychiatry unit and discharged on Lexapro 20 mg, melatonin 9 mg. ??He has not engaged with mental health resources or taken medication for depression or insomnia in 8-10 months.????Denies SI, passive suicidality, self-harm, or HI at this time.   - Psych consulted??  - Melatonin nightly  - c/s supportive care   ??  # Homelessness - Social Concerns:??[name] Gross has experienced homelessness for ~5 months when he left his mother's house. ??He has severed all ties ties with his family and states his relationship with his mother cannot be reconciled. ??He also requested contact information of family members to be erased from his chart (this has been completed, but social worker did drop contact info into her note on 9/24 in case he changes his mind during admission). ??In addition to food insecurity, he has not been able to secure medications. ??He is routinely exposed to alcohol and drugs (marijuana, cocaine, etc.) second-hand through his friendships though he denies current use himself. ??On urine drug screen, he tested positive for marijuana only.  - Patient plans to be discharged to a local homeless shelter and begin job hunting  - SW will Chief Technology Officer for [employer] and homeless shelters in [place], [place], and [employer]  -Supportive care following. Would like [name] Gross to work on naming a Runner, broadcasting/film/video in the event he cannot make decisions for himself.   ??  Access:??PIV  ??  Discharge criteria:??liver transplant work-up and treatment of acute rejection    Subjective:     Interval History: No acute events overnight. [name] Gross was feeling well this morning, ordering breakfast. Did not have any emesis overnight and has not been feeling nauseous. Continues to take good PO    Objective:     Vital signs in last 24 hours:  Temp:  [36.4 ??C-36.9 ??C] 36.9 ??C  Heart Rate:  [62-92] 83  SpO2 Pulse:  [63-82] 82  Resp:  [14-28] 28  BP: (107-120)/(51-80) 107/51  MAP (mmHg):  [67-88] 67  SpO2:  [99 %-100 %] 99 %  Intake/Output last 3 shifts:  I/O last 3 completed shifts:  In: 4361.8 [P.O.:2280; I.V.:1301.8; Blood:557; IV Piggyback:223]  Out: 1500 [Urine:1500]    Physical Exam:  General:   Laying in bed, in no acute distress.  Head: normocephalic, atraumatic.  Eyes: EOMI. Scleral icterus. Orbital edema.  Nose:   clear, no discharge  Lungs: clear breath sounds bilaterally. No increased work of breathing. No wheezes/crackles.  Heart: regular rate and rhythm. No murmurs appreciated on auscultation.  Abdomen:  Abdomen soft, distended, and mildly tender in RUQ to palpation. Bowel sounds present.  Neuro: no focal deficits noted.  Extremities:  Full ROM in all extremities. Pedal edema.   Skin:   warm, no rashes, no ecchymosis    Active Medications reviewed and KEY Medications include:     Current Facility-Administered Medications:   ???  calcium carbonate (OS-CAL) tablet 300 mg elem calcium, 300 mg elem calcium, Oral, BID, Anastasia Pall, MD, 300 mg elem calcium at 08/13/19 2143  ???  calcium carbonate (TUMS) chewable tablet 300 mg elem calcium, 300 mg elem calcium, Oral, Once, Anastasia Pall, MD  ???  dextrose 50 % in water (D50W) 50 % solution 12.5 g, 12.5 g, Intravenous, Q10 Min PRN, Ejiofor A Lucky Cowboy., MD  ???  Implement, , , Until Discontinued **AND** Care order/instruction, , , PRN **AND** Vital signs, , , PRN **AND** Nursing  oxygen orders / instructions, , , PRN **AND** sodium chloride (NS) 0.9 % infusion, 20 mL/hr, Intravenous, Continuous PRN **AND** sodium chloride 0.9% (NS) bolus 1,000 mL, 1,000 mL, Intravenous, Daily PRN **AND** diphenhydrAMINE (BENADRYL) injection 25 mg, 25 mg, Intravenous, Q4H PRN **AND** famotidine (PEPCID) injection 20 mg, 20 mg, Intravenous, Q4H PRN **AND** methylPREDNISolone sodium succinate (PF) (Solu-MEDROL) injection 125 mg, 125 mg, Intravenous, Q4H PRN **AND** EPINEPHrine (EPIPEN) injection 0.3 mg, 0.3 mg, Intramuscular, Daily PRN, Almon Register, MD  ???  ergocalciferol (DRISDOL) capsule 50,000 Units, 50,000 Units, Oral, Weekly, Ejiofor A Lucky Cowboy., MD, 50,000 Units at 08/08/19 1214  ???  famotidine (PEPCID) oral suspension, 20 mg, Oral, Once, Anastasia Pall, MD  ???  heparin preservative-free injection 10 units/mL syringe (HEPARIN LOCK FLUSH), 20 Units, Intravenous, Daily PRN, Anastasia Pall, MD, 20 Units at 08/10/19 1012  ???  influenza vaccine quad (FLUARIX, FLULAVAL, FLUZONE) (6 MOS & UP) 2020-21, 0.5 mL, Intramuscular, During hospitalization, Anastasia Pall, MD  ???  insulin lispro (HumaLOG) injection 0-2.5 Units, 0-2.5 Units, Subcutaneous, Nightly, Ejiofor A Lucky Cowboy., MD, 1 Units at 08/14/19 0140  ???  insulin lispro (HumaLOG) injection 0-5 Units, 0-5 Units, Subcutaneous, TID AC, Ejiofor A Lucky Cowboy., MD, 1 Units at 08/13/19 2044  ???  melatonin tablet 3 mg, 3 mg, Oral, QPM, Anastasia Pall, MD, 3 mg at 08/13/19 2143  ???  multivitamin, with zinc (AQUADEKS) chewable tablet, 2 tablet, Oral, Daily, Ejiofor A Lucky Cowboy., MD, 2 tablet at 08/13/19 [phone]  ???  mycophenolate (MYFORTIC) EC tablet 540 mg, 540 mg, Oral, BID, Anastasia Pall, MD, 540 mg at 08/13/19 2143  ???  nystatin (MYCOSTATIN) oral suspension, 1,000,000 Units, Oral, TID, Anastasia Pall, MD, 1,000,000 Units at 08/13/19 2003  ???  ondansetron (ZOFRAN) injection 4 mg, 4 mg, Intravenous, Once, Ejiofor A Lucky Cowboy., MD  ???  ondansetron (ZOFRAN) injection 4 mg, 4 mg, Intravenous, Q8H SCH, Ejiofor A Lucky Cowboy., MD, 4 mg at 08/14/19 9562  ???  pantoprazole (PROTONIX) EC tablet 40 mg, 40 mg, Oral, Daily, Almon Register, MD, 40 mg at 08/13/19 2142  ???  sodium chloride 0.45% (1/2 NS) infusion, 45 mL/hr, Intravenous, Continuous, Ejiofor A Lucky Cowboy., MD, Last Rate: 45 mL/hr at 08/14/19 0145, 45 mL/hr at 08/14/19 0145  ???  [START ON 08/15/2019] sulfamethoxazole-trimethoprim (BACTRIM) 400-80 mg tablet 80 mg of trimethoprim, 1 tablet, Oral, Q MWF, Ejiofor A Lucky Cowboy., MD  ???  tacrolimus (PROGRAF) oral suspension, 0.25 mg, Oral, BID, Almon Register, MD, 0.25 mg at 08/13/19 2142  ???  valGANciclovir (VALCYTE) tablet 450 mg, 450 mg, Oral, Daily, Almon Register, MD, 450 mg at 08/13/19 1308        Studies: Personally reviewed and interpreted.  Labs/Studies:  Labs and Studies from the last 24hrs per EMR and Reviewed and   All lab results last 24 hours:    Recent Results (from the past 24 hour(s))   Tacrolimus Level, Trough    Collection Time: 08/13/19 10:02 AM Result Value Ref Range    Tacrolimus, Trough 5.5 5.0 - 15.0 ng/mL   POCT Glucose    Collection Time: 08/13/19 11:47 AM   Result Value Ref Range    Glucose, POC 175 70 - 179 mg/dL   POCT Glucose    Collection Time: 08/13/19  3:04 PM   Result Value Ref Range    Glucose, POC 159 70 - 179 mg/dL   POCT Glucose    Collection Time: 08/13/19  8:43  PM   Result Value Ref Range    Glucose, POC 155 70 - 179 mg/dL   POCT Glucose    Collection Time: 08/14/19  1:36 AM   Result Value Ref Range    Glucose, POC 220 (H) 70 - 179 mg/dL   Magnesium Level    Collection Time: 08/14/19  6:01 AM   Result Value Ref Range    Magnesium 2.1 1.6 - 2.2 mg/dL   Phosphorus Level    Collection Time: 08/14/19  6:01 AM   Result Value Ref Range    Phosphorus 3.1 2.9 - 4.7 mg/dL   Comprehensive Metabolic Panel    Collection Time: 08/14/19  6:01 AM   Result Value Ref Range    Sodium 140 135 - 145 mmol/L    Potassium 5.0 3.5 - 5.0 mmol/L    Chloride 111 (H) 98 - 107 mmol/L    Anion Gap 5 (L) 7 - 15 mmol/L    CO2 24.0 22.0 - 30.0 mmol/L    BUN 27 (H) 7 - 21 mg/dL    Creatinine 1.47 8.29 - 1.30 mg/dL    BUN/Creatinine Ratio 25     EGFR CKD-EPI Non-African American, Male >90 >=60 mL/min/1.45m2    EGFR CKD-EPI African American, Male >90 >=60 mL/min/1.22m2    Glucose 185 (H) 70 - 179 mg/dL    Calcium 7.9 (L) 8.5 - 10.2 mg/dL    Albumin 2.3 (L) 3.5 - 5.0 g/dL    Total Protein 6.3 (L) 6.5 - 8.3 g/dL    Total Bilirubin 56.2 (H) 0.0 - 1.2 mg/dL    AST 130 (H) 19 - 55 U/L    ALT 476 (H) <50 U/L    Alkaline Phosphatase 986 (H) 65 - 260 U/L   Gamma GT (GGT)    Collection Time: 08/14/19  6:01 AM   Result Value Ref Range    GGT 399 (H) 12 - 109 U/L   CBC w/ Differential    Collection Time: 08/14/19  6:01 AM   Result Value Ref Range    WBC 6.9 4.5 - 11.0 10*9/L    RBC 3.48 (L) 4.50 - 5.90 10*12/L    HGB 9.5 (L) 13.5 - 17.5 g/dL    HCT 86.5 (L) 78.4 - 53.0 %    MCV 79.8 (L) 80.0 - 100.0 fL    MCH 27.1 26.0 - 34.0 pg    MCHC 34.0 31.0 - 37.0 g/dL    RDW 69.6 (H) 29.5 - 15.0 %    MPV 7.9 7.0 - 10.0 fL    Platelet 197 150 - 440 10*9/L    Neutrophils % 79.7 %    Lymphocytes % 15.0 %    Monocytes % 3.9 %    Eosinophils % 0.1 %    Basophils % 0.1 %    Neutrophil Left Shift 1+ (A) Not Present    Absolute Neutrophils 5.5 2.0 - 7.5 10*9/L    Absolute Lymphocytes 1.0 (L) 1.5 - 5.0 10*9/L    Absolute Monocytes 0.3 0.2 - 0.8 10*9/L    Absolute Eosinophils 0.0 0.0 - 0.4 10*9/L    Absolute Basophils 0.0 0.0 - 0.1 10*9/L    Large Unstained Cells 1 0 - 4 %    Microcytosis Marked (A) Not Present    Anisocytosis Marked (A) Not Present    Hypochromasia Slight (A) Not Present     ========================================  Gerrie Nordmann, MD  [facility], PGY-1  [phone]

## 2019-08-14 NOTE — Unmapped (Signed)
Pt remained afebrile, VSS throughout shift. No complaints of pain or nausea. R PICC c/d/I, labs drawn, infusing IVF as ordered. ATG completed this shift, tolerated well. Not much Po intake, pt voiding in toilet, asked to save in urinal w future voids. ACHS accuchecks, insulin given sliding scale as ordered, see MAR. Resting comfortably. Will continue to monitor.     Problem: Adult Inpatient Plan of Care  Goal: Plan of Care Review  Outcome: Ongoing - Unchanged  Goal: Patient-Specific Goal (Individualization)  Outcome: Ongoing - Unchanged  Goal: Absence of Hospital-Acquired Illness or Injury  Outcome: Ongoing - Unchanged  Goal: Optimal Comfort and Wellbeing  Outcome: Ongoing - Unchanged  Goal: Readiness for Transition of Care  Outcome: Ongoing - Unchanged  Goal: Rounds/Family Conference  Outcome: Ongoing - Unchanged     Problem: Wound  Goal: Optimal Wound Healing  Outcome: Ongoing - Unchanged

## 2019-08-14 NOTE — Unmapped (Addendum)
[place]'s VSS on RA, remains afebrile throughout shift. R arm PICC C/D/I, infusing fluids.Tac trough drawn. R PIV C/D/I, SL. Pt. Voiding well, brown urine. No BM noted. C/o pain only when passing stool, RN visualized, small red tag by anus. Pt denied pain when sitting. Pt drinking well with encouragement. Not c/o of pain throughout shift. No c/o nausea. Albumin given. Pt tolerated infusios well. ATG infusing currently, premed with bendryl, tylenol, and solumedrol.  Pt tolerating well. Glucose checks continue ACHS, 1 unit insulin given for bfast and lunch. No family at bedside. Will ctm and follow POC.        Problem: Adult Inpatient Plan of Care  Goal: Rounds/Family Conference  Outcome: Progressing     Problem: Wound  Goal: Optimal Wound Healing  Outcome: Progressing     Problem: Adult Inpatient Plan of Care  Goal: Absence of Hospital-Acquired Illness or Injury  Outcome: Progressing

## 2019-08-14 NOTE — Unmapped (Signed)
Pediatric Tacrolimus Therapeutic Monitoring Pharmacy Note  ??  [name]??Gross??is a 18 y.o.??male??continuing??tacrolimus.   ??  Indication:??Liver transplant??  ??  Date of Transplant: 09/22/2016??  ??  Current Dosing Information: Tacrolimus 0.25 mg suspentison (~0.004??mg/kg/DOSE) PO q12h  ??  Dosing Weight: 55.9 kg  ??  Goals:  Therapeutic Drug Levels  Tacrolimus trough goal: 5-7 ng/mL  ??  Additional Clinical Monitoring/Outcomes  ?? Monitor renal function (SCr and urine output) and liver function (LFTs)  ?? Monitor for signs/symptoms of adverse events (e.g., hyperglycemia, hyperkalemia, hypomagnesemia, hypertension, headache, tremor)  ??  Results:   Tacrolimus level:??4.4??ng/mL, level drawn ~12 hours after previous dose and is approximately at steady steady state.   ??  Longitudinal Dose Monitoring:  Date Dose (mg)  AM / PM AM Scr (mg/dL) Level  (ng/mL) Key Drug Interactions   08/14/19 0.25 mg/ 1.06 4.4 None   08/13/19 0.25 mg/0.25 mg 1.28 5.5 None   08/12/19 --- / 0.25 mg 1.25 5.9 None   08/11/19 0.5 mg / --- ??1.26 ??10.1 ??None   08/10/19 1 mg / HOLD 1.4 10.5 None   08/09/19 1 mg / 1 mg 1.29 --- None   08/08/19 3 mg / HOLD 1.5 11.9 None   08/06/19 5 mg / 3 mg 0.8 9 None   ??  Pharmacokinetic Considerations and Significant Drug Interactions:  ?? Concurrent hepatotoxic medications: None identified  ?? Concurrent CYP3A4 substrates/inhibitors: None identified  ?? Concurrent nephrotoxic medications: Valganciclovir, SMP/TMX,   ??  Assessment/Plan:  Trough level is subtherapeutic. SCr is trending down and LFTs remain elevated.     Recommendedation(s)  ?? Change dose to 0.25 mg every morning and 0.5 mg every evening. Based on patients recent history of supratherapeutic levels would recommend increasing slowly.   ?? Order DAILY tacrolimus levels until consistently within goal range of 5-7, tomorrow's level will not be at steady state and should only be adjusted if above goal  ?? Note, patient started on thymoglobulin and high dose steroids 08/11/19  ??  Follow-up  ?? Next level ordered:??08/15/19 at 0930.  ?? A pharmacist will continue to monitor and recommend levels as appropriate    Plan discussed with [name] Lex, MD  ??  Please page service pharmacist with questions/clarifications.    Kerman Passey, PharmD  Pediatric Clinical Pharmacist   [phone] office: [phone]

## 2019-08-14 NOTE — Unmapped (Signed)
[place]'s VSS on RA, remains afebrile throughout shift. R arm PICC C/D/I, infusing medications. Tac trough drawn. R PIV C/D/I, SL. Pt. UO adequate, brown urine. No BM noted. Pt denied pain throughout shift. Pt drinking well. Not c/o of pain throughout shift. No c/o nausea.  Albumin given. Pt tolerated infusion well. Day 3 of ATG given, infusing currently, premed with bendryl, tylenol, and solumedrol.  Pt tolerating well. Glucose checks continue ACHS, 1 unit insulin given for bfast and 2 for lunch. No family at bedside. Will ctm and follow POC.          Problem: Adult Inpatient Plan of Care  Goal: Plan of Care Review  Outcome: Progressing     Problem: Wound  Goal: Optimal Wound Healing  Outcome: Progressing     Problem: Fall Injury Risk  Goal: Absence of Fall and Fall-Related Injury  Outcome: Progressing

## 2019-08-15 LAB — CBC W/ AUTO DIFF
BASOPHILS ABSOLUTE COUNT: 0 10*9/L (ref 0.0–0.1)
BASOPHILS RELATIVE PERCENT: 0.1 %
EOSINOPHILS ABSOLUTE COUNT: 0 10*9/L (ref 0.0–0.4)
EOSINOPHILS RELATIVE PERCENT: 0.1 %
HEMATOCRIT: 27.9 % — ABNORMAL LOW (ref 41.0–53.0)
HEMOGLOBIN: 9.2 g/dL — ABNORMAL LOW (ref 13.5–17.5)
LARGE UNSTAINED CELLS: 5 % — ABNORMAL HIGH (ref 0–4)
LYMPHOCYTES ABSOLUTE COUNT: 0.6 10*9/L — ABNORMAL LOW (ref 1.5–5.0)
MEAN CORPUSCULAR HEMOGLOBIN CONC: 33.1 g/dL (ref 31.0–37.0)
MEAN CORPUSCULAR HEMOGLOBIN: 26.7 pg (ref 26.0–34.0)
MEAN CORPUSCULAR VOLUME: 80.5 fL (ref 80.0–100.0)
MEAN PLATELET VOLUME: 7.9 fL (ref 7.0–10.0)
MONOCYTES ABSOLUTE COUNT: 0.4 10*9/L (ref 0.2–0.8)
NEUTROPHILS ABSOLUTE COUNT: 6.8 10*9/L (ref 2.0–7.5)
NEUTROPHILS RELATIVE PERCENT: 82.4 %
PLATELET COUNT: 181 10*9/L (ref 150–440)
RED BLOOD CELL COUNT: 3.46 10*12/L — ABNORMAL LOW (ref 4.50–5.90)
WBC ADJUSTED: 8.2 10*9/L (ref 4.5–11.0)

## 2019-08-15 LAB — MAGNESIUM: Magnesium:MCnc:Pt:Ser/Plas:Qn:: 1.9

## 2019-08-15 LAB — COMPREHENSIVE METABOLIC PANEL
ALKALINE PHOSPHATASE: 828 U/L — ABNORMAL HIGH (ref 65–260)
ALT (SGPT): 494 U/L — ABNORMAL HIGH (ref <50)
ANION GAP: 3 mmol/L — ABNORMAL LOW (ref 7–15)
BILIRUBIN TOTAL: 16.3 mg/dL — ABNORMAL HIGH (ref 0.0–1.2)
BLOOD UREA NITROGEN: 21 mg/dL (ref 7–21)
BUN / CREAT RATIO: 20
CALCIUM: 7.8 mg/dL — ABNORMAL LOW (ref 8.5–10.2)
CHLORIDE: 112 mmol/L — ABNORMAL HIGH (ref 98–107)
CO2: 24 mmol/L (ref 22.0–30.0)
CREATININE: 1.04 mg/dL (ref 0.70–1.30)
EGFR CKD-EPI AA MALE: >90 mL/min/{1.73_m2} (ref >=60)
EGFR CKD-EPI NON-AA MALE: >90 mL/min/{1.73_m2} (ref >=60)
GLUCOSE RANDOM: 217 mg/dL — ABNORMAL HIGH (ref 70–179)
PROTEIN TOTAL: 6.3 g/dL — ABNORMAL LOW (ref 6.5–8.3)
SODIUM: 139 mmol/L (ref 135–145)

## 2019-08-15 LAB — TACROLIMUS, TROUGH: Lab: 4.2 — ABNORMAL LOW

## 2019-08-15 LAB — CD3 TX WORKUP, FLOW
ABSOLUTE CD3 CNT: 141 {cells}/uL — ABNORMAL LOW (ref 915–3400)
ABSOLUTE CD8 CNT: 91 {cells}/uL — ABNORMAL LOW (ref 180–1520)
CD3% (T CELLS)": 14 % — ABNORMAL LOW (ref 61–86)
CD4% (T HELPER)": <1 % — ABNORMAL LOW (ref 34–58)
CD4:CD8 RATIO: <0.1 — ABNORMAL LOW (ref 0.9–4.8)

## 2019-08-15 LAB — BILIRUBIN DIRECT: Bilirubin.glucuronidated+Bilirubin.albumin bound:MCnc:Pt:Ser/Plas:Qn:: 13.1 — ABNORMAL HIGH

## 2019-08-15 LAB — PHOSPHORUS: Phosphate:MCnc:Pt:Ser/Plas:Qn:: 3.2

## 2019-08-15 LAB — GAMMA GT: GAMMA GLUTAMYL TRANSFERASE: 431 U/L — ABNORMAL HIGH (ref 12–109)

## 2019-08-15 LAB — GAMMA GLUTAMYL TRANSFERASE: Gamma glutamyl transferase:CCnc:Pt:Ser/Plas:Qn:: 431 — ABNORMAL HIGH

## 2019-08-15 LAB — BASOPHILS RELATIVE PERCENT: Lab: 0.1

## 2019-08-15 LAB — CD4% (T HELPER)": Cells.CD3+CD4+/100 cells:NFr:Pt:Bld:Qn:: <1 — ABNORMAL LOW

## 2019-08-15 LAB — BUN / CREAT RATIO: Urea nitrogen/Creatinine:MRto:Pt:Ser/Plas:Qn:: 20

## 2019-08-15 NOTE — Unmapped (Signed)
Pediatric Daily Progress Note     Assessment/Plan:     Principal Problem:    Liver transplant rejection (CMS-HCC)  Active Problems:    Recurrent major depressive disorder, in partial remission (CMS-HCC)    Malnutrition of mild degree (CMS-HCC)    History of liver transplant (CMS-HCC)    Sickle cell trait (CMS-HCC)    Transaminitis    Anemia    Hypoalbuminemia    Cholangitis of transplanted liver (CMS-HCC)    Abdominal pain    Melena    Homeless    Dark stools  Resolved Problems:    * No resolved hospital problems. 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     Overall, labs have been trending in the right direction and AKI seems to be improving s/p albumin infusion. Will continue with Thymoglobulin regimen. New lesion on right ear today, concerning as he is extremely immunocompromised. Derm consulted and will further evaluate this afternoon.   ??  # Hx Liver Transplant - Subacute/Acute Liver Failure: ??Pt has biopsy confirmed severe acute rejection.??Liver biopsy??completed??9/25;??RAI 9/9  - GI/transplant following, appreciate recs   - Day 5 of Thymoglobulin with MethylPred  - started on Valgancyclovir 450mg  daily (3months) and Bactrim MWF (74mo) for prophylaxis (10/2)  - started on Nystatin 10ml TID (88mo) prophylaxis (10/2)  - pending labs: Liver/Kidney Microsome type 1 antibody, Anti-smooth muscle antibody  - s/p Solumedrol 560 mg IV qd (9/27-9/30) and solumedrol 500 mg qd (9/26)  - Mycophenolate 540mg  bid    - 0.25mg  AM Tacro, 0.5mg  PM Tacro. Daily troughs  - 3x??Vit K injection 10 mg  - EBV pending, HIV negative, CMV viral load not detected  - Vit D level pending  - MELD score 24 - estimated 3 month mortality of 20%  - PICC line place 9/28  - AM coags if prolonged will do factor V and VII levels  ??  FEN/GI:??Abnormal electrolyte levels in setting of ongoing liver disease and recent kidney injury  - Monitor fluid status and daily weights closely.   - sensitive Sliding scale Lispro added for BG >200  -Zofran??q8 hours scheduled  -Protonix 40 mg daily  -Regular diet??  -Daily CMP, Mg, Ph  -giving Ergocalciferol   -providing ensures BID  -aquadex multivitamin      #Lesion on Right Ear  -Dermatology consulted, appreciate recs  -Mupirocin ointment  ??  # AKI:  - Nephro following:    - Creatinine improving  - Encouraging PO intake (>1.6L of fluids PO/day)   - STRICT I/Os    ??  # Abdominal Pain, dark stools: ??Unclear etiology, as FOBT x2 negative. ??One possibility is recent use of bismuth-containing medications. ??  - Perianal exam (10/1) within normal limits with mild perianal skin breakdown. No external hemorrhoids    #Severe Malnutrition:????Prior to admission, patient has been skipping meals due to poor appetite and food insecurity.  - Nutrition consult, appreciate recs  - Thiamine completed 9/26  - Multivitamin, Ca 300 mg BID, vit D 1000u QD   - Monitor phosphorous level.  - Repeat EKG later in  admission  ??  # Major Depressive Disorder: Stable. Has hx of attempted overdoses, with the last attempt in April 2019??at which point he was hospitalized in the Adolescent Psychiatry unit and discharged on Lexapro 20 mg, melatonin 9 mg. ??He has not engaged with mental health resources or taken medication for depression or insomnia in 8-10 months.????Denies SI, passive suicidality, self-harm, or HI at this time.   - Psych consulted??  - Melatonin nightly  - c/s supportive care   ??  # Homelessness - Social Concerns:??[name] has experienced homelessness for ~5 months when he left his mother's house. ??He has severed all ties ties with his family and states his relationship with his mother cannot be reconciled. ??He also requested contact information of family members to be erased from his chart (this has been completed, but social worker did drop contact info into her note on 9/24 in case he changes his mind during admission). ??In addition to food insecurity, he has not been able to secure medications. ??He is routinely exposed to alcohol and drugs (marijuana, cocaine, etc.) second-hand through his friendships though he denies current use himself. ??On urine drug screen, he tested positive for marijuana only.  - Patient plans to be discharged to a local homeless shelter and begin job hunting  - SW will Chief Technology Officer for [employer] and homeless shelters in [place], [place], and [place]  -Supportive care following. Would like [name] to work on naming a Runner, broadcasting/film/video in the event he cannot make decisions for himself.   ??  Access:??PIV  ??  Discharge criteria:??liver transplant work-up and treatment of acute rejection    Subjective:     Interval History: No acute events overnight. Did not complain of nausea or vomiting. Feels about the same as he did yesterday, says he's working hard on meeting his fluid goals.     Objective:     Vital signs in last 24 hours:  Temp:  [36.8 ??C-37.1 ??C] 37.1 ??C  Heart Rate:  [63-83] 70  SpO2 Pulse:  [78-81] 78  Resp:  [17-34] 20  BP: (113-122)/(58-69) 116/58  MAP (mmHg):  [74-83] 75  SpO2:  [98 %-100 %] 100 %  Intake/Output last 3 shifts:  I/O last 3 completed shifts:  In: 5070 [P.O.:2880; I.V.:1410; Blood:557; IV Piggyback:223]  Out: 2250 [Urine:2250]    Physical Exam:  General:   Laying in bed, watching video games on his laptop  Head: normocephalic, atraumatic.  Eyes: EOMI. Scleral icterus present. Mild orbital edema.  Nose:   clear, no discharge  Lungs: clear breath sounds bilaterally. No increased work of breathing. No wheezes/crackles.  Heart: regular rate and rhythm. No murmurs appreciated on auscultation.  Abdomen:  Abdomen soft, distended, and mildly tender in RUQ to palpation. Bowel sounds present.  Neuro: no focal deficits noted.  Extremities:  Full ROM in all extremities. Pedal edema.   Skin:   warm, no rashes, no ecchymosis    Active Medications reviewed and KEY Medications include:     Current Facility-Administered Medications:   ???  calcium carbonate (OS-CAL) tablet 300 mg elem calcium, 300 mg elem calcium, Oral, BID, Anastasia Pall, MD, 300 mg elem calcium at 08/14/19 2203  ???  calcium carbonate (TUMS) chewable tablet 300 mg elem calcium, 300 mg elem calcium, Oral, Once, Anastasia Pall, MD  ???  dextrose 50 % in water (D50W) 50 % solution 12.5 g, 12.5 g, Intravenous, Q10 Min PRN, Ejiofor A Lucky Cowboy., MD  ???  Implement, , ,  Until Discontinued **AND** Care order/instruction, , , PRN **AND** Vital signs, , , PRN **AND** Nursing oxygen orders / instructions, , , PRN **AND** sodium chloride (NS) 0.9 % infusion, 20 mL/hr, Intravenous, Continuous PRN **AND** sodium chloride 0.9% (NS) bolus 1,000 mL, 1,000 mL, Intravenous, Daily PRN **AND** diphenhydrAMINE (BENADRYL) injection 25 mg, 25 mg, Intravenous, Q4H PRN **AND** famotidine (PEPCID) injection 20 mg, 20 mg, Intravenous, Q4H PRN **AND** methylPREDNISolone sodium succinate (PF) (Solu-MEDROL) injection 125 mg, 125 mg, Intravenous, Q4H PRN **AND** EPINEPHrine (EPIPEN) injection 0.3 mg, 0.3 mg, Intramuscular, Daily PRN, Almon Register, MD  ???  ergocalciferol (DRISDOL) capsule 50,000 Units, 50,000 Units, Oral, Weekly, Ejiofor A Lucky Cowboy., MD, 50,000 Units at 08/08/19 1214  ???  famotidine (PEPCID) oral suspension, 20 mg, Oral, Once, Anastasia Pall, MD  ???  heparin preservative-free injection 10 units/mL syringe (HEPARIN LOCK FLUSH), 20 Units, Intravenous, Daily PRN, Anastasia Pall, MD, 20 Units at 08/10/19 1012  ???  influenza vaccine quad (FLUARIX, FLULAVAL, FLUZONE) (6 MOS & UP) 2020-21, 0.5 mL, Intramuscular, During hospitalization, Anastasia Pall, MD  ???  insulin lispro (HumaLOG) injection 0-2.5 Units, 0-2.5 Units, Subcutaneous, Nightly, Ejiofor A Lucky Cowboy., MD, 1 Units at 08/14/19 2113  ???  insulin lispro (HumaLOG) injection 0-5 Units, 0-5 Units, Subcutaneous, TID AC, Ejiofor A Lucky Cowboy., MD, 1 Units at 08/14/19 1523  ???  melatonin tablet 3 mg, 3 mg, Oral, QPM, Anastasia Pall, MD, 3 mg at 08/14/19 2208  ???  multivitamin, with zinc (AQUADEKS) chewable tablet, 2 tablet, Oral, Daily, Ejiofor A Lucky Cowboy., MD, 2 tablet at 08/14/19 [phone]  ???  mycophenolate (MYFORTIC) EC tablet 540 mg, 540 mg, Oral, BID, Anastasia Pall, MD, 540 mg at 08/14/19 2205  ???  nystatin (MYCOSTATIN) oral suspension, 1,000,000 Units, Oral, TID, Anastasia Pall, MD, 1,000,000 Units at 08/14/19 2116  ???  ondansetron [facility]) injection 4 mg, 4 mg, Intravenous, Once, Ejiofor A Lucky Cowboy., MD  ???  ondansetron (ZOFRAN) injection 4 mg, 4 mg, Intravenous, Q8H SCH, Ejiofor A Lucky Cowboy., MD, 4 mg at 08/14/19 2335  ???  pantoprazole (PROTONIX) EC tablet 40 mg, 40 mg, Oral, Daily, Almon Register, MD, 40 mg at 08/14/19 2203  ???  sodium chloride 0.45% (1/2 NS) infusion, 10 mL/hr, Intravenous, Continuous, Val Eagle, Last Rate: 10 mL/hr at 08/14/19 2249, 10 mL/hr at 08/14/19 2249  ???  sulfamethoxazole-trimethoprim (BACTRIM) 400-80 mg tablet 80 mg of trimethoprim, 1 tablet, Oral, Q MWF, Ejiofor A Lucky Cowboy., MD  ???  tacrolimus (PROGRAF) oral suspension, 0.25 mg, Oral, Daily, Prathipa Santhanam, MD  ???  tacrolimus (PROGRAF) oral suspension, 0.5 mg, Oral, Nightly (2000), Prathipa Santhanam, MD, 0.5 mg at 08/14/19 2203  ???  valGANciclovir (VALCYTE) tablet 450 mg, 450 mg, Oral, Daily, Almon Register, MD, 450 mg at 08/14/19 9604        Studies: Personally reviewed and interpreted.  Labs/Studies:  Labs and Studies from the last 24hrs per EMR and Reviewed and   All lab results last 24 hours:    Recent Results (from the past 24 hour(s))   POCT Glucose    Collection Time: 08/14/19  9:37 AM   Result Value Ref Range    Glucose, POC 151 70 - 179 mg/dL   Tacrolimus Level, Trough    Collection Time: 08/14/19  9:40 AM   Result Value Ref Range    Tacrolimus, Trough 4.4 (L) 5.0 - 15.0 ng/mL   APTT    Collection Time: 08/14/19  9:40 AM  Result Value Ref Range    APTT 25.0 (L) 25.3 - 37.1 sec    Heparin Correlation <0.2    PT-INR    Collection Time: 08/14/19  9:40 AM   Result Value Ref Range    PT 15.7 (H) 10.2 - 13.1 sec    INR 1.36    POCT Glucose    Collection Time: 08/14/19 11:48 AM   Result Value Ref Range    Glucose, POC 202 (H) 70 - 179 mg/dL   POCT Glucose    Collection Time: 08/14/19  3:20 PM   Result Value Ref Range    Glucose, POC 185 (H) 70 - 179 mg/dL   POCT Glucose    Collection Time: 08/14/19  8:31 PM   Result Value Ref Range    Glucose, POC 222 (H) 70 - 179 mg/dL     ========================================  Gerrie Nordmann, MD  [facility], PGY-1  [phone]

## 2019-08-15 NOTE — Unmapped (Signed)
Pediatric Tacrolimus Therapeutic Monitoring Pharmacy Note  ??  Firas??[name]??is a 18 y.o.??male??continuing??tacrolimus.   ??  Indication:??Liver transplant??  ??  Date of Transplant: 09/22/2016??  ??  Current Dosing Information: Tacrolimus??0.25 mg QAM / 0.5 mg QPM  ??  Dosing Weight: 55.9 kg  ??  Goals:  Therapeutic Drug Levels  Tacrolimus trough goal: 5-7 ng/mL  ??  Additional Clinical Monitoring/Outcomes  ?? Monitor renal function (SCr and urine output) and liver function (LFTs)  ?? Monitor for signs/symptoms of adverse events (e.g., hyperglycemia, hyperkalemia, hypomagnesemia, hypertension, headache, tremor)  ??  Results:   Tacrolimus level:??4.2??ng/mL, level drawn ~12 hours after previous dose and is approximately at steady steady state.   ??  Longitudinal Dose Monitoring:  Date Dose (mg)  AM / PM AM Scr (mg/dL) Level  (ng/mL) Key Drug Interactions   08/15/19 0.25 mg/  1.04 4.2 None   08/14/19 0.25 mg/0.5 mg 1.06 4.4 None   08/13/19 0.25 mg/0.25 mg 1.28 5.5 None   08/12/19 --- / 0.25 mg 1.25 5.9 None   08/11/19 0.5 mg /??--- ??1.26 ??10.1 ??None   08/10/19 1 mg / HOLD 1.4 10.5 None   08/09/19 1 mg / 1 mg 1.29 --- None   08/08/19 3 mg / HOLD 1.5 11.9 None   08/06/19 5 mg / 3 mg 0.8 9 None   ??  Pharmacokinetic Considerations and Significant Drug Interactions:  ?? Concurrent hepatotoxic medications: None identified  ?? Concurrent CYP3A4 substrates/inhibitors: None identified  ?? Concurrent nephrotoxic medications: Valganciclovir, SMP/TMX,   ??  Assessment/Plan:  Trough level is subtherapeutic. SCr is trending down and LFTs remain elevated. U/O is steady.    ??  Recommendedation(s)  ?? Continue current dosing at 0.25 mg every morning and 0.5 mg every evening. Based on patients recent history of supratherapeutic levels would recommend increasing slowly.   ?? Order DAILY tacrolimus levels until consistently within goal range of 5-7.   ?? Note, patient started on thymoglobulin and high dose steroids 08/11/19  ??  Follow-up  ?? Next level ordered:??08/16/19 at 0930.  ?? A pharmacist will continue to monitor and recommend levels as appropriate  ??  Please page service pharmacist with questions/clarifications.    Mabeline Caras, PharmD, BCPPS

## 2019-08-15 NOTE — Unmapped (Signed)
Pt afebrile and VSS throughout shift. Pt completed scheduled ATG and PICC CDI on KVO IVF. Labs drawn. BG checked as ordered-1 unit insulin given. Good I/O. No family at bedside. WCTM and intervene as needed.     Problem: Adult Inpatient Plan of Care  Goal: Plan of Care Review  Outcome: Progressing  Goal: Patient-Specific Goal (Individualization)  Outcome: Progressing  Goal: Absence of Hospital-Acquired Illness or Injury  Outcome: Progressing  Goal: Optimal Comfort and Wellbeing  Outcome: Progressing  Goal: Readiness for Transition of Care  Outcome: Progressing  Goal: Rounds/Family Conference  Outcome: Progressing     Problem: Wound  Goal: Optimal Wound Healing  Outcome: Progressing     Problem: Fall Injury Risk  Goal: Absence of Fall and Fall-Related Injury  Outcome: Progressing     Problem: Self-Care Deficit  Goal: Improved Ability to Complete Activities of Daily Living  Outcome: Progressing

## 2019-08-15 NOTE — Unmapped (Signed)
[name] Gross has remained afebrile, all other VSS. Tolerating all meds as ordered. ACHS bld glc, insulin x1 this shift. PICC site remains c/d/i. Eating well, voiding appropriately. Plan for ATG this evening after steroids and premeds. No family at bedside.           Problem: Adult Inpatient Plan of Care  Goal: Plan of Care Review  Outcome: Ongoing - Unchanged  Goal: Patient-Specific Goal (Individualization)  Outcome: Ongoing - Unchanged  Goal: Absence of Hospital-Acquired Illness or Injury  Outcome: Ongoing - Unchanged  Goal: Optimal Comfort and Wellbeing  Outcome: Ongoing - Unchanged  Goal: Readiness for Transition of Care  Outcome: Ongoing - Unchanged  Goal: Rounds/Family Conference  Outcome: Ongoing - Unchanged     Problem: Wound  Goal: Optimal Wound Healing  Outcome: Ongoing - Unchanged     Problem: Fall Injury Risk  Goal: Absence of Fall and Fall-Related Injury  Outcome: Ongoing - Unchanged     Problem: Self-Care Deficit  Goal: Improved Ability to Complete Activities of Daily Living  Outcome: Ongoing - Unchanged

## 2019-08-15 NOTE — Unmapped (Signed)
Pediatric Palliative Care Consult Note:  ??  [name] Gross is a 18 y.o. male seen for pediatric palliative care consultation at the request of [name] Gross for goals of care, advance care planning and family support  ??  [name] Care Provider: [name], MD  ??  History provided by: patient  ??  Assessment:  ??  [name] Gross is a 18 y.o. 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 His hospital course has been complicated by AKI, which is improving. His liver function did not respond to high dose steroids, now being tried with thymoglobulin, and his MELD score of 21 is concerning with its associated ~20% mortality over 90 days. [name] Gross has discussed his lack of response to initial therapy, and is concerned he may require another liver transplant. Palliative care consult requested for goals of care, advance care planning and family support.  [name] Gross has limited social supports, and has cut himself off from most of his family. We have discussed the importance of him naming a surrogate decision maker, in the event he cannot make decisions for himself; as well as need for caregivers if he chose to pursue liver transplant. He is considering naming a friend as a Runner, broadcasting/film/video and his maternal grandparents as designated care givers/source of housing. However, today he mentioned he is re-considering if he would sacrifice his autonomy to live with his maternal grandparents.  He would benefit from family meeting to discuss prognosis and plan of care. We also suggested [name] Gross think about having a support person at a meeting with his providers. We appreciate that [name] Gross is seeking autonomy, however he needs supportive caregivers, and to identify a surrogate decision maker, during this time while he is critically ill and at risk for further decompensation.  ??  Summary of Discussion and Recommendations:   ??  1. SYMPTOMS:   - Nausea: Reported to be improved on current regimen.  Appetite also increased.  Has been able to choose from a variety of foods off menu.  - Insomnia: Scheduled melatonin nightly.  Earplugs available at bedside.  - Depression: History of suicide attempt in Apirl 2019, denied SI to other providers during this hospital stay.  Amenable to Psychiatry consult from adult provider.  [name] stated that he was less open to pediatric providers based on previous experience/encounters.  ??  2. GOALS:   - [name] hopes to make a recovery, find housing, and find a job. He is particularly interested in help finding housing in the area.   - [name] Gross has long term goals which include going back to school and becoming a Runner, broadcasting/film/video or nurse.   ??  3. DECISIONS:   - Given risk of hepatic encephalopathy, and risk of his condition deteriorating, recommended [name] Gross think about who he would want to be his surrogate decision maker if he was unable to make decisions for himself (see ACP below)   - [name] Gross likes to receive information directly. He has been told that since his liver function has not responded to treatment to date that he is at risk for needing another liver transplant. [name] Gross is not sure if he would like to pursue a liver transplant, and is going to think about it.   - If he would like to be a candidate for redo liver transplant, [name] Gross needs social supports including  stable housing, transportation, etc to be determined. He is considering his maternal grandparents as care givers but is still thinking about this.   - Recommend family meeting later this week.  - [name] Children's Decision Making Tool given and explained.   ??  4. ADVANCE CARE PLANNING  - Reminded [name] of the Voicing My Choices booklet and offered to assist him with completing book if wanted/needed..  - Provided [name] Children's decision making tool to help [name] think about pursing another liver transplant.   - Recommended [name] think about who he would want to be his surrogate decision maker if he was unable to make decisions for himself, he is considering choosing a friend. [name] will think about this further and we will revisit.   - Code status: full code. - Prognosis: Guarded, his current MELD score of 21 has an estimated 3 month mortality rate of ~20%. Medical Gross concerned that his liver function did not respond to high dose steroids, currently being treated with thymoglobulin, which is not without significant risk  ??  5. SUPPORT:   - [name] Gross describes limited personal supports including an aunt and maternal grandparents, and friends. He has not spoken to any friends/family since being admitted to the hospital. He has chosen to leave his phone off but is open to turning it back on soon.   - Discussed hospital based resources including chaplains.  - [name] Gross agreed to begin SSI application as suggested by transplant social worker, as the process could take months to complete.  He has since decided to wait until he was on a secure network instead of the an open/public network at the hospital due to security concerns.  He was also told how to access his medical records.  ??  6. CARE COORDINATION:   - Care coordinated with [name] Gross and nursing.   - Recommend meeting with key medical providers and support person(s) of [name] Gross's choice as we learn more about his condition and treatment options. He is in agreement that this would be helpful. [name] Gross plans to reach out to people starting tomorrow (Tues Oct 6th), meeting potentially could occur later this week.    ??    Total time spent with patient for evaluation & management (excluding ACP documented separately): 75 minutes (2-3:15pm).  Greater than 50% of this time spent on counseling/coordination of care: Yes.  See ACP Note from today for additional billable service: No, 0 minutes.        We appreciate the consult. The [facility] can be reached by pager [name]) or email [email or web address]).   ??  ??  History of Present Illness:  [name] Gross??is a 18 y.o.??male??with history [name] sclerosing cholangitis s/p liver transplant (2017) with multiple??epsiodes??of rejection, depression (history of suicide attempt via over dose in April 2019)??who??was admitted with reports of??black stools, abdominal pain, and icteric sclera??most likely due to acute liver transplant rejection/failure. His hospital course has been complicated by AKI. His liver function has not yet responded to pulse dose steroids, [name] Gross concerned about overall prognosis and need for repeat liver transplant.   ??  Interval events:  [name] Gross reports improved nausea and appetite. His nausea is not completely resolved, comes/goes in waves, and he continues to feel full early but he did note that the Gross told him his PO intake has objectively improved.      At the end of our visit last week, [name] Gross was aware of multiple tasks recommended by his transplant Gross, including applying for disability, reaching out  to a potential surrogate decision maker (likely a friend) and reaching out to potential caregivers (likely maternal grandparents).  He reports that over the weekend he would not work on calling people because he is concerned and anxious about being agitated.  He is stressed about suddenly putting a lot on other people's plates in regards to his current medical condition, and assisting with medical decision making.  He set a goal to turn his phone on tomorrow, Tuesday (10/6), and plans to start discussions with family and friends.    Today [name] Gross expressed a lot of frustration with his family, again around his lack of independence.  He is reconsidering if he truly would like to live with his maternal grandparents, because he knows that they will likely inform his mother about his condition, and involve his mother in his care again.  He does not like the lack of independence that his family has given him in the past, and this is making him consider whether he would even want to pursue another liver transplant.  He is well aware of the psychosocial barriers that would preclude him from being considered for another liver transplant.      Our Gross offered to be present with [name] Gross to provide support and assist with language if needed when he calls family/friends.  He would need to identify windows of time so that we could make plans to be available.  He expressed appreciation and understanding.  Once family/friends had been contacted, we discussed addressing advance planning further as to not overwhelmed with too many tasks at once.      Allergies:  Bee pollen and Pollen extracts  ??  Medications:  Scheduled Meds:  ??? acetaminophen  650 mg Oral Once   ??? anti-thymocyte globulin (THYMOGLOBIN-rabbit) IVPB CENTRAL LINE FORMULATION  100 mg Intravenous Once   ??? calcium carbonate  300 mg elem calcium Oral BID   ??? calcium carbonate  300 mg elem calcium Oral Once   ??? diphenhydrAMINE  50 mg Oral Once   ??? ergocalciferol  50,000 Units Oral Weekly   ??? famotidine  20 mg Oral Once   ??? flu vacc [id number] 6mos up(PF)  0.5 mL Intramuscular During hospitalization   ??? insulin lispro  0-2.5 Units Subcutaneous Nightly   ??? insulin lispro  0-5 Units Subcutaneous TID AC   ??? melatonin  3 mg Oral QPM   ??? methylPREDNISolone sodium succinate  500 mg Intravenous Once   ??? multivitamin, with zinc  2 tablet Oral Daily   ??? mupirocin   Topical TID   ??? mycophenolate  540 mg Oral BID   ??? nystatin  1,000,000 Units Oral TID   ??? ondansetron  4 mg Intravenous Once   ??? ondansetron  4 mg Intravenous [facility]   ??? pantoprazole  40 mg Oral Daily   ??? sulfamethoxazole-trimethoprim  1 tablet Oral Q MWF   ??? Tacrolimus  0.25 mg Oral Daily   ??? Tacrolimus  0.5 mg Oral Nightly (2000)   ??? valGANciclovir  450 mg Oral Daily     Continuous Infusions:  ??? sodium chloride ??? sodium chloride 0.45 % 10 mL/hr (08/14/19 2249)     PRN Meds:.dextrose 50 % in water (D50W), Implement **AND** Care order/instruction **AND** Vital signs **AND** Nursing oxygen orders / instructions **AND** sodium chloride **AND** sodium chloride 0.9% **AND** diphenhydrAMINE **AND** famotidine **AND** methylPREDNISolone sodium succinate (PF) **AND** EPINEPHrine, heparin, porcine (PF)    Physical Exam:   Vitals:    08/15/19 1550  BP: 110/60   Pulse: 78   Resp: 16   Temp: 37.2 ??C   SpO2: 99%   ??  Gen: Sitting in bed, NAD. Appears to have more energy today relative to a few days ago.  HEENT: +scleral icterus.   Psych: Longer stretches of pressured speech, more challenging to interrupt/redirect today. Appeared oriented, did not complete full mental status testing.    ??  Data: Reviewed test results in Epic.  ??  Harold Hedge, MD, MPH  PGY-2 Pediatrics    [facility], [place], CPNP  [facility]  [phone] (pager)        Collaborating Physician Attestation    I was the supervising physician in the delivery of the service. I discussed Bluford's case at length with Dr. Alma Downs and [facility], [place] and was involved with making the recommendations contained in this note.     Waldon Reining, MD, MPH  Attending Physician, [facility]

## 2019-08-15 NOTE — Unmapped (Signed)
Dermatology Inpatient Consult Note    Requesting Attending Physician :  Lazaro Arms  Service Requesting Consult : Ped Gastroenterology [facility])  Consulting Attending Physician: Dr. Heloise Beecham    Reason for Consult: [name] Gross is seen in consultation today by Dr. Heloise Beecham at the request of Dr. Ciro Backer of the [facility] G[facility]enterology [facility]) service for evaluation of skin lesion of concer.    Assessment/Recommendations:    Impetigo of the R superior auricular sulcus   - aerobic culture from surface swab obtained today   - mupirocin ointment BID to AA until healed   - further treatment decisions based on culture results.    Thank you for the consult. Please page [phone] with any questions or concerns.  ______________________________________________________________________    History of Present Illness: :  [name] Gross is a 18 y.o. 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  We have been consulted to evaluate skin lesion of concern. Patient currently receiving thymoglobulin, Solumedrol, tacrolimus and Mycophenolate for acute rejection.      Patient reports a one week history of a sore on the R ear. This was initially itchy. He admits to scratching at it. Now it is more painful and draining. No treatments tried. No similar areas anywhere else on the body.    Allergies:  Bee pollen and Pollen extracts    Medications:   Current medication list reviewed in Epic.    Medical History:  Past Medical History:   Diagnosis Date   ??? 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 Depression    ??? Seasonal allergies    ??? Sickle cell trait (CMS-HCC)        Social History:  Social History     Tobacco Use   ??? Smoking status: Never Smoker   ??? Smokeless tobacco: Never Used   Substance Use Topics   ??? Alcohol use: Never     Alcohol/week: 0.0 standard drinks     Frequency: Never     Comment: 1/2 glass of wine on rare occasions   ??? Drug use: Never       Family History:  Negative for chronic skin disease.      Review of Systems:  Pertinent positives in HPI.  All systems were reviewed and were negative unless mentioned in HPI.       Objective: :    Vitals:  Vitals:    08/15/19 0933   BP: 112/61   Pulse: 62   Resp: 16   Temp: 36.9 ??C   SpO2:        Physical Exam:  GEN: Well-appearing in NAD  NEURO: Alert and oriented  SKIN: Examination of the scalp, face, neck, chest, back, abdomen, bilateral upper and lower extremities including palms, soles, and nails was performed today and unremarkable except as below:  - R superior auricular sulcus with fissure with yellow honey-colored crust  - All other areas not specifically commented on are within normal limits.        Labs:  Lab Results   Component Value Date    WBC 8.2 08/15/2019    HGB 9.2 (L) 08/15/2019    HCT 27.9 (L) 08/15/2019    PLT 181 08/15/2019  Lab Results   Component Value Date    NA 139 08/15/2019    K 4.5 08/15/2019    CL 112 (H) 08/15/2019    CO2 24.0 08/15/2019    BUN 21 08/15/2019    CREATININE 1.04 08/15/2019    GLU 217 (H) 08/15/2019    CALCIUM 7.8 (L) 08/15/2019    MG 1.9 08/15/2019    PHOS 3.2 08/15/2019       Lab Results   Component Value Date    BILITOT 16.3 (H) 08/15/2019    BILIDIR 0.40 01/21/2019    PROT 6.3 (L) 08/15/2019    ALBUMIN 2.3 (L) 08/15/2019    ALT 494 (H) 08/15/2019    AST 299 (H) 08/15/2019    ALKPHOS 828 (H) 08/15/2019    GGT 431 (H) 08/15/2019       Lab Results   Component Value Date    PT 15.7 (H) 08/14/2019    INR 1.36 08/14/2019    APTT 25.0 (L) 08/14/2019

## 2019-08-16 LAB — COMPREHENSIVE METABOLIC PANEL
ALBUMIN: 2.3 g/dL — ABNORMAL LOW (ref 3.5–5.0)
ALKALINE PHOSPHATASE: 851 U/L — ABNORMAL HIGH (ref 65–260)
ALT (SGPT): 498 U/L — ABNORMAL HIGH (ref <50)
ANION GAP: 3 mmol/L — ABNORMAL LOW (ref 7–15)
AST (SGOT): 248 U/L — ABNORMAL HIGH (ref 19–55)
BILIRUBIN TOTAL: 17.7 mg/dL — ABNORMAL HIGH (ref 0.0–1.2)
BLOOD UREA NITROGEN: 22 mg/dL — ABNORMAL HIGH (ref 7–21)
BUN / CREAT RATIO: 20
CALCIUM: 7.8 mg/dL — ABNORMAL LOW (ref 8.5–10.2)
CREATININE: 1.09 mg/dL (ref 0.70–1.30)
EGFR CKD-EPI AA MALE: >90 mL/min/{1.73_m2} (ref >=60)
EGFR CKD-EPI NON-AA MALE: >90 mL/min/{1.73_m2} (ref >=60)
POTASSIUM: 4.5 mmol/L (ref 3.5–5.0)
PROTEIN TOTAL: 6 g/dL — ABNORMAL LOW (ref 6.5–8.3)
SODIUM: 137 mmol/L (ref 135–145)

## 2019-08-16 LAB — CBC W/ AUTO DIFF
BASOPHILS ABSOLUTE COUNT: 0 10*9/L (ref 0.0–0.1)
BASOPHILS RELATIVE PERCENT: 0.2 %
EOSINOPHILS ABSOLUTE COUNT: 0 10*9/L (ref 0.0–0.4)
EOSINOPHILS RELATIVE PERCENT: 0.3 %
HEMATOCRIT: 28.5 % — ABNORMAL LOW (ref 41.0–53.0)
HEMOGLOBIN: 9.4 g/dL — ABNORMAL LOW (ref 13.5–17.5)
LARGE UNSTAINED CELLS: 1 % (ref 0–4)
LYMPHOCYTES ABSOLUTE COUNT: 0.9 10*9/L — ABNORMAL LOW (ref 1.5–5.0)
LYMPHOCYTES RELATIVE PERCENT: 9.6 %
MEAN CORPUSCULAR HEMOGLOBIN CONC: 33 g/dL (ref 31.0–37.0)
MEAN CORPUSCULAR HEMOGLOBIN: 26.3 pg (ref 26.0–34.0)
MEAN PLATELET VOLUME: 7.8 fL (ref 7.0–10.0)
MONOCYTES ABSOLUTE COUNT: 0.4 10*9/L (ref 0.2–0.8)
MONOCYTES RELATIVE PERCENT: 4.4 %
NEUTROPHILS RELATIVE PERCENT: 84.5 %
PLATELET COUNT: 171 10*9/L (ref 150–440)
RED BLOOD CELL COUNT: 3.58 10*12/L — ABNORMAL LOW (ref 4.50–5.90)
RED CELL DISTRIBUTION WIDTH: 23.9 % — ABNORMAL HIGH (ref 12.0–15.0)
WBC ADJUSTED: 8.9 10*9/L (ref 4.5–11.0)

## 2019-08-16 LAB — PHOSPHORUS: Phosphate:MCnc:Pt:Ser/Plas:Qn:: 2.9

## 2019-08-16 LAB — TACROLIMUS, TROUGH: Lab: 3.2 — ABNORMAL LOW

## 2019-08-16 LAB — ANION GAP: Anion gap 3:SCnc:Pt:Ser/Plas:Qn:: 3 — ABNORMAL LOW

## 2019-08-16 LAB — LIVER-KIDNEY MICROSOMAL AB: Liver kidney microsomal 1 Ab:ACnc:Pt:Ser:Qn:: <5

## 2019-08-16 LAB — MAGNESIUM: Magnesium:MCnc:Pt:Ser/Plas:Qn:: 1.8

## 2019-08-16 LAB — MEAN PLATELET VOLUME: Lab: 7.8

## 2019-08-16 NOTE — Unmapped (Signed)
Pediatric Palliative Care Consult Note:  ??  [name] Gross is a 18 y.o. male seen for pediatric palliative care consultation at the request of Dr. Curtis Sites for goals of care, advance care planning and family support  ??  Primary Care Provider: [name], MD  ??  History provided by: patient  ??  Assessment:  ??  [name] Gross is a 18 y.o. 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 His hospital course has been complicated by AKI, which is improving. His liver function did not respond to high dose steroids, now being tried with thymoglobulin.  He would benefit from ongoing discussions about prognosis and plan of care, ideally with a supportive caregiver present if one can be identified. We also suggested [name] Gross think about having a support person at a meeting with his providers, and he has reached out to two friends. We appreciate that [name] Gross is seeking autonomy, however he needs supportive caregivers, and to identify a surrogate decision maker, during this time while he is critically ill and at risk for further decompensation.    ??  Summary of Discussion and Recommendations:   ??  1. SYMPTOMS:   - Nausea: Reported to be improved on current regimen.  Appetite also increased.  Has been able to choose from a variety of foods off menu.  - Insomnia: Scheduled melatonin nightly.  Earplugs available at bedside.  - Depression: History of suicide attempt in Apirl 2019, denied SI to other providers during this hospital stay.  Amenable to Psychiatry consult from adult provide - to see today.  Cherry stated that he was less open to pediatric providers based on previous experience/encounters.  ??  2. GOALS:   - [name] Gross hopes to make a recovery, find housing, and find a job. He is particularly interested in help finding housing in the area.   - [name] Gross has long term goals which include going back to school and becoming a Runner, broadcasting/film/video or nurse.   ??  3. DECISIONS:   - Given risk of hepatic encephalopathy, and risk of his condition deteriorating, recommended [name] Gross think about who he would want to be his surrogate decision maker if he was unable to make decisions for himself (see ACP below)   - [name] Gross likes to receive information directly. He has been told that since his liver function has not responded to treatment to date that he is at risk for needing another liver transplant. [name] Gross is not sure if he would like to pursue a liver transplant even if he is a candidate, and is thinking about it. He has been told that if he would like to be a candidate for liver transplant, he needs social supports including stable housing, transportation, etc - all is to be determined. He is considering trying to engage his maternal grandparents as caregivers but is still thinking about this.   - [name] Gross Decision Making Tool given and explained at a previous visit   ??  4. ADVANCE CARE PLANNING  - [name] Gross of the Vo[employer] My Choices booklet and offered to assist him with completing book if wanted/needed.  - Recommended [name] Gross think about who he would want to be his surrogate decision maker if he was unable to make decisions for himself, he is considering choosing a friend. [name] Gross will think about this further and we will revisit.   -  Code status: full code.   - Prognosis: Guarded. Medical team concerned that his liver function did not respond to high dose steroids, currently being treated with thymoglobulin, which is not without significant risk and may not be successful.   ??  5. SUPPORT:   - [name] Gross describes limited personal supports including an aunt and maternal grandparents, and friends. He has not spoken to any friends/family since being admitted to the hospital. He has chosen to leave his phone off but is open to turning it back on soon.   - Discussed hospital based resources including chaplains.  - [name] Gross agreed to begin SSI application as suggested by transplant social worker, as the process could take months to complete.  He has since decided to wait until he was on a secure network instead of the an open/public network at the hospital due to security concerns.  He was also told how to access his medical records.  ??  6. CARE COORDINATION:   - Care coordinated with primary team and nursing.   - Recommend meeting with key medical providers and support person(s) of Valentine's choice as we learn more about his condition and treatment options. He is in agreement that this would be helpful. [name] Gross started to reach out to potential supports/surrogate decision makers.     ??    Total time spent with patient for evaluation & management (excluding ACP documented separately): 75 minutes (11-12:15pm).  Greater than 50% of this time spent on counseling/coordination of care: Yes.  See ACP Note from today for additional billable service: No, 0 minutes.        We appreciate the consult. The Gross Supportive Care Team can be reached by pager [name]) or email ([email or web address]).   ??  ??  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 His hospital course has been complicated by AKI. His liver function has not yet responded to pulse dose steroids, primary team concerned about overall prognosis and need for repeat liver transplant.   ??  Interval events:  [name] Gross denies any bothersome symptoms today.     He again shared with [place] that he is not entirely confident he would like to live with his grandparents again.  He is concerned that his maternal grandparents would share information with his mother even if he instructed them not to.  His concern is that even if his maternal grandmother tried not to share information with his mother, she would accidentally share some information, and he is not happy about this possibility.     We asked [name] Gross about the possibility of extended family who could potentially be caregivers, he denied any extended family connections.  We offered to help him have discussions with family, and to investigate if there could be a potential third-party mediator to help resolve some of these stresses with his family.  [name] Gross has heard that one of the reasons the caregiver situation is being emphasized is because it would be unfortunate for him to not be eligible for a liver transplant for this reason if he would otherwise choose to pursue transplant and no other contraindications are identified.    Today [name] Gross turned on his phone while we were in the room, and texted two friends who may be able to support him and  participate in his care. One friend, [name] Gross, is currently in [employer] for college. The other, Nyah, lives in [place], near [place]. He texted them that he would like to speak to them on the phone and asked when would be a good time to talk.     Ashad also shared concerns about the safety of his siblings, please see social work Geophysical data processor [place], [place]) documentation for further details.     Allergies:  Bee pollen and Pollen extracts  ??  Medications:  Scheduled Meds:  ??? anti-thymocyte globulin (THYMOGLOBIN-rabbit) IVPB CENTRAL LINE FORMULATION  100 mg Intravenous Once   ??? calcium carbonate  300 mg elem calcium Oral BID   ??? calcium carbonate  300 mg elem calcium Oral Once   ??? ergocalciferol  50,000 Units Oral Weekly   ??? flu vacc [id number] 6mos up(PF)  0.5 mL Intramuscular During hospitalization   ??? insulin lispro  0-2.5 Units Subcutaneous Nightly   ??? insulin lispro  0-5 Units Subcutaneous TID AC   ??? melatonin  3 mg Oral QPM   ??? multivitamin, with zinc  2 tablet Oral Daily   ??? mupirocin   Topical TID   ??? mycophenolate  540 mg Oral BID   ??? nystatin  1,000,000 Units Oral TID   ??? ondansetron  4 mg Intravenous Once   ??? pantoprazole  40 mg Oral Daily   ??? sulfamethoxazole-trimethoprim  1 tablet Oral Q MWF   ??? Tacrolimus  0.5 mg Oral BID   ??? valGANciclovir  450 mg Oral Daily     Continuous Infusions:  ??? sodium chloride     ??? sodium chloride 0.45 % 10 mL/hr (08/14/19 2249)     PRN Meds:.dextrose 50 % in water (D50W), Implement **AND** Care order/instruction **AND** Vital signs **AND** Nursing oxygen orders / instructions **AND** sodium chloride **AND** sodium chloride 0.9% **AND** diphenhydrAMINE **AND** famotidine **AND** methylPREDNISolone sodium succinate (PF) **AND** EPINEPHrine, heparin, porcine (PF), ondansetron    Physical Exam:   Vitals:    08/16/19 1600   BP: 105/67   Pulse: 76   Resp: 20   Temp: 36.8 ??C   SpO2: 100%   ??  Gen: Sitting up in bed, NAD.  HEENT: +scleral icterus.   Psych: Appropriate mental status for age. Normal range of affect. Less pressured speech today.   ??  Data: Reviewed test results in Epic.  ??  Harold Hedge, MD, MPH  PGY-2 Pediatrics    [facility], [place], CPNP  Gross Supportive Care Team  [phone] (pager)        Collaborating Physician Attestation    I was the supervising physician in the delivery of the service. I discussed Olamide's case with [facility], CPNP and was involved with making the recommendations contained in this note.     Waldon Reining, MD, MPH  Attending Physician, Gross Supportive Care Team

## 2019-08-16 NOTE — Unmapped (Addendum)
Pediatric Daily Progress Note     Assessment/Plan:     Principal Problem:    Liver transplant rejection (CMS-HCC)  Active Problems:    Recurrent major depressive disorder, in partial remission (CMS-HCC)    Malnutrition of mild degree (CMS-HCC)    History of liver transplant (CMS-HCC)    Sickle cell trait (CMS-HCC)    Transaminitis    Anemia    Hypoalbuminemia    Cholangitis of transplanted liver (CMS-HCC)    Abdominal pain    Melena    Homeless    Dark stools  Resolved Problems:    * No resolved hospital problems. *    [name] Gross??is a 18 y.o.??male??with history of??unconfirmed primary sclerosing cholangitis s/p liver transplant (2017) with multiple??epsiodes??of rejection??who??was admitted on 08/04/19 with reports of??black stools, abdominal pain, and icteric sclera??most likely due to acute liver transplant rejection/failure.??He is currently undergoing treatment for rejection with thyroglobulin and steroids, with no change in his liver labs to date. We will continue to monitor for changes daily with the course of thyroglobulin/steroid treatment to be determined based on response. If we fail to see a response, we will continue to work-up for alternative diagnoses for liver dysfunction. His active issues are a tenuous fluid balance, with maintaining intravascular volume and fluid overload. He has recently recovered from ATN, with normal renal function. He recently received albumin infusion for hypoalbuminemia and edema. He has worsening ascites over the past few days. We will encourage oral PO, and continue to monitor his fluid status for now. He has steroid-induced hyperglycemia, for which we have consulted adult Endocrine and will start meal coverage insulin. Today he has a new skin lesion seen on left neck today for which Derm was consulted, given his immunocompromised state.  ??  # Hx Liver Transplant - Subacute/Acute Liver Failure: ??Pt has biopsy confirmed severe acute rejection (9/25), RAI 9/9  - Day 6 of Thymoglobulin with MethylPrednisolone (10/1-)   - monitor labs daily for response (CBC, CMP, mag, phos, GGT)  - Increase current tacrolimus dosing to 0.5 mg twice daily (given subtherapeutic today), and obtain daily tacrolimus levels until consisently within goal range of 5-7 per pharmacy recommendations  - C4D staining of liver biopsy sent today; will confirm with Surg Pathology 10/8 that it was sent   - Continue Valgancyclovir 450mg  daily (3months) and Bactrim MWF (48mo) for prophylaxis (10/2-)  - Continue on Nystatin 10ml TID (6mo) prophylaxis (10/2-)  - f//u pending labs: Liver/Kidney Microsome type 1 antibody, Anti-smooth muscle antibody (at risk for autoimmune liver disease)  - Mycophenolate 540mg  bid    - MELD score 24 - estimated 3 month mortality of 20%  - PICC line placed 9/28  ??  FEN/GI:??Hyperchloridemia, elevated BUN, hyperglycemia on most recent electrolytes   - Monitor fluid status and daily weights  - Encouraging PO intake (>1.6L of fluids PO/day)   - STRICT I/Os    - Hyperglycemia    - Endo consulted 10/7    - Humalog 3 units w/meals, senstive sliding scale with meals only    - glucose checks qAC and HS  - Zofran??q8 hours PO PRN  - Protonix 40 mg PO daily  - Regular diet, ensure supplement BID  - Ergocalciferol qMon   - aquadex multivitamin    - Daily CMP, Mg, Ph    #Lesion on Right Ear and Left Neck  -Dermatology consulted, appreciate recs  -Mupirocin ointment   - f/u aerobic culture of ear lesion 10/5   - f/u derm recs for  neck lesion   ??  # Abdominal Pain, dark stools: ??Unclear etiology, as FOBT x2 negative. ??One possibility is recent use of bismuth-containing medications. ??  - Perianal exam (10/1) within normal limits with mild perianal skin breakdown. No external hemorrhoids    #Severe Malnutrition:????Prior to admission, patient has been skipping meals due to poor appetite and food insecurity.  - Nutrition consult, appreciate recs  - Thiamine completed 9/26  - Multivitamin, Ca 300 mg BID, vit D 1000u QD   - Monitor phosphorous level  ??  # Major Depressive Disorder, insomnia: Stable. Has hx of attempted overdoses, with the last attempt in April 2019??at which point he was hospitalized in the Adolescent Psychiatry unit and discharged on Lexapro 20 mg. ??He has not engaged with mental health resources or taken medication for depression or insomnia in 8-10 months.????Denies SI, passive suicidality, self-harm, or HI at this time.   - adult psychiatry consulted, seen 10/6    - patient amenable to see adult psych   - Melatonin qHS    # Homelessness - Social Concerns:??Gilberto has experienced homelessness for ~5 months when he left his mother's house. ??He has severed all ties with his family and states his relationship with his mother cannot be reconciled. ??He also requested contact information of family members to be erased from his chart (this has been completed, but social worker did drop contact info into her note on 9/24 in case he changes his mind during admission). ??In addition to food insecurity, he has not been able to secure medications. ??He is routinely exposed to alcohol and drugs (marijuana, cocaine, etc.) second-hand through his friendships though he denies current use himself. ??On urine drug screen, he tested positive for marijuana only.  - Patient plans to be discharged to a local homeless shelter and begin job hunting  - SW will Chief Technology Officer for [employer] and homeless shelters in [place], [place], and [place]  -Supportive care following. Would like [place] to work on naming a Runner, broadcasting/film/video in the event he cannot make decisions for himself. He has identified a few friends as possibilities.   ??  Access:??PIV, PICC (placed 9/28)  ??  Discharge criteria:??liver transplant work-up and treatment of acute rejection    Subjective:     Interval History: No acute events overnight. Patient states he has nausea but has not been vomiting, reports he has been able to eat and drink his usual amount despite nausea. Patient also pointed out a new skin lesion on his neck. States there is no itchiness or pain but that he noticed it last night while touching his neck. Patient continuing to use mupirocin ointment on his right ear. Supportive care is continuing to see the patient and encouraging him to name a surrogate decision maker. He endorses right-sided abdominal pain.       Objective:     Vital signs in last 24 hours:  Temp:  [36.8 ??C-37 ??C] 36.8 ??C  Heart Rate:  [70-89] 77  SpO2 Pulse:  [79-95] 79  Resp:  [18-20] 20  BP: (100-121)/(48-75) 118/75  MAP (mmHg):  [64-88] 88  SpO2:  [98 %-100 %] 100 %  Intake/Output last 3 shifts:  I/O last 3 completed shifts:  In: 1735.7 [P.O.:1080; I.V.:153.7; IV Piggyback:502]  Out: 2750 [Urine:2750]    Physical Exam:  General:   Laying in bed, watching video games on his laptop  Head: normocephalic, atraumatic.  Eyes: EOMI. Scleral icterus present. Mild orbital edema.  Nose:   clear, no  discharge  Lungs: clear breath sounds bilaterally. No increased work of breathing. No wheezes/crackles.  Heart: regular rate and rhythm. No murmurs appreciated on auscultation.  Abdomen:  Abdomen soft, distended, and tender to palpation in RUQ, RLQ. Bowel sounds present.  Neuro: no focal deficits noted. Liver and spleen not palpated d/t tenderness.   Extremities:  Full ROM in all extremities. Patient appeared to be having bilateral UE tremors during exam. Non-pitting pedal edema.   Skin:WWP Skin lesion present on right ear and left neck (see photos in Epic).    Active Medications reviewed and KEY Medications include:     Current Facility-Administered Medications:   ???  antithymocyte globulin (rabbit) (THYMOGLOBULIN) 100 mg in sodium chloride (NS) 0.9 % 250 mL CENTRAL LINE formulation, 100 mg, Intravenous, Once, Sallyanne Havers, MD, Last Rate: 51.7 mL/hr at 08/16/19 1622, 100 mg at 08/16/19 1622  ???  calcium carbonate (OS-CAL) tablet 300 mg elem calcium, 300 mg elem calcium, Oral, BID, Anastasia Pall, MD, 300 mg elem calcium at 08/16/19 0940  ???  calcium carbonate (TUMS) chewable tablet 300 mg elem calcium, 300 mg elem calcium, Oral, Once, Anastasia Pall, MD  ???  dextrose 50 % in water (D50W) 50 % solution 12.5 g, 12.5 g, Intravenous, Q10 Min PRN, Ejiofor A Lucky Cowboy., MD  ???  Implement, , , Until Discontinued **AND** Care order/instruction, , , PRN **AND** Vital signs, , , PRN **AND** Nursing oxygen orders / instructions, , , PRN **AND** sodium chloride (NS) 0.9 % infusion, 20 mL/hr, Intravenous, Continuous PRN **AND** sodium chloride 0.9% (NS) bolus 1,000 mL, 1,000 mL, Intravenous, Daily PRN **AND** diphenhydrAMINE (BENADRYL) injection 25 mg, 25 mg, Intravenous, Q4H PRN **AND** famotidine (PEPCID) injection 20 mg, 20 mg, Intravenous, Q4H PRN **AND** methylPREDNISolone sodium succinate (PF) (Solu-MEDROL) injection 125 mg, 125 mg, Intravenous, Q4H PRN **AND** EPINEPHrine (EPIPEN) injection 0.3 mg, 0.3 mg, Intramuscular, Daily PRN, Almon Register, MD  ???  ergocalciferol (DRISDOL) capsule 50,000 Units, 50,000 Units, Oral, Weekly, Ejiofor A Lucky Cowboy., MD, 50,000 Units at 08/15/19 [phone]  ???  heparin preservative-free injection 10 units/mL syringe (HEPARIN LOCK FLUSH), 20 Units, Intravenous, Daily PRN, Anastasia Pall, MD, 20 Units at 08/16/19 0939  ???  influenza vaccine quad (FLUARIX, FLULAVAL, FLUZONE) (6 MOS & UP) 2020-21, 0.5 mL, Intramuscular, During hospitalization, Anastasia Pall, MD  ???  insulin lispro (HumaLOG) injection 0-5 Units, 0-5 Units, Subcutaneous, TID AC, David Stall, MD, 2 Units at 08/16/19 1501  ???  insulin lispro (HumaLOG) injection 3 Units, 3 Units, Subcutaneous, TID AC, David Stall, MD  ???  melatonin tablet 3 mg, 3 mg, Oral, QPM, Anastasia Pall, MD, 3 mg at 08/15/19 2119  ???  multivitamin, with zinc (AQUADEKS) chewable tablet, 2 tablet, Oral, Daily, Ejiofor A Lucky Cowboy., MD, 2 tablet at 08/16/19 0941  ???  mupirocin (BACTROBAN) 2 % ointment, , Topical, TID, Ejiofor A Lucky Cowboy., MD  ???  mycophenolate (MYFORTIC) EC tablet 540 mg, 540 mg, Oral, BID, Anastasia Pall, MD, 540 mg at 08/16/19 9604  ???  nystatin (MYCOSTATIN) oral suspension, 1,000,000 Units, Oral, TID, Anastasia Pall, MD, 1,000,000 Units at 08/16/19 1415  ???  ondansetron (ZOFRAN) injection 4 mg, 4 mg, Intravenous, Once, Ejiofor A Lucky Cowboy., MD  ???  ondansetron (ZOFRAN-ODT) disintegrating tablet 4 mg, 4 mg, Oral, Q8H PRN, David Stall, MD  ???  pantoprazole (PROTONIX) EC tablet 40 mg, 40 mg, Oral, Daily, Almon Register, MD, 40 mg at 08/15/19 2119  ???  sodium chloride 0.45% (1/2 NS) infusion, 10 mL/hr, Intravenous, Continuous, Val Eagle, Last Rate: 10 mL/hr at 08/14/19 2249, 10 mL/hr at 08/14/19 2249  ???  sulfamethoxazole-trimethoprim (BACTRIM) 400-80 mg tablet 80 mg of trimethoprim, 1 tablet, Oral, Q MWF, Ejiofor A Lucky Cowboy., MD, 80 mg of trimethoprim at 08/15/19 0942  ???  tacrolimus (PROGRAF) oral suspension, 0.5 mg, Oral, BID, Sallyanne Havers, MD  ???  valGANciclovir (VALCYTE) tablet 450 mg, 450 mg, Oral, Daily, Almon Register, MD, 450 mg at 08/16/19 0940        Studies: Personally reviewed and interpreted.  Labs/Studies:  Labs and Studies from the last 24hrs per EMR and Reviewed and   All lab results last 24 hours:    Recent Results (from the past 24 hour(s))   Aerobic Culture    Collection Time: 08/15/19  4:54 PM    Specimen: Ear, Right; Surface Swab   Result Value Ref Range    Aerobic Culture Too Young To Read; No Predominant Pathogen.     Gram Stain Result 1+ Polymorphonuclear leukocytes     Gram Stain Result No organisms seen    POCT Glucose    Collection Time: 08/15/19  8:16 PM   Result Value Ref Range    Glucose, POC 270 (H) 70 - 179 mg/dL   Magnesium Level    Collection Time: 08/16/19  5:41 AM   Result Value Ref Range    Magnesium 1.8 1.6 - 2.2 mg/dL   Phosphorus Level    Collection Time: 08/16/19  5:41 AM   Result Value Ref Range    Phosphorus 2.9 2.9 - 4.7 mg/dL   Comprehensive Metabolic Panel Collection Time: 08/16/19  5:41 AM   Result Value Ref Range    Sodium 137 135 - 145 mmol/L    Potassium 4.5 3.5 - 5.0 mmol/L    Chloride 108 (H) 98 - 107 mmol/L    Anion Gap 3 (L) 7 - 15 mmol/L    CO2 26.0 22.0 - 30.0 mmol/L    BUN 22 (H) 7 - 21 mg/dL    Creatinine 4.54 0.98 - 1.30 mg/dL    BUN/Creatinine Ratio 20     EGFR CKD-EPI Non-African American, Male >90 >=60 mL/min/1.62m2    EGFR CKD-EPI African American, Male >90 >=60 mL/min/1.58m2    Glucose 221 (H) 70 - 179 mg/dL    Calcium 7.8 (L) 8.5 - 10.2 mg/dL    Albumin 2.3 (L) 3.5 - 5.0 g/dL    Total Protein 6.0 (L) 6.5 - 8.3 g/dL    Total Bilirubin 11.9 (H) 0.0 - 1.2 mg/dL    AST 147 (H) 19 - 55 U/L    ALT 498 (H) <50 U/L    Alkaline Phosphatase 851 (H) 65 - 260 U/L   CBC w/ Differential    Collection Time: 08/16/19  5:41 AM   Result Value Ref Range    WBC 8.9 4.5 - 11.0 10*9/L    RBC 3.58 (L) 4.50 - 5.90 10*12/L    HGB 9.4 (L) 13.5 - 17.5 g/dL    HCT 82.9 (L) 56.2 - 53.0 %    MCV 79.6 (L) 80.0 - 100.0 fL    MCH 26.3 26.0 - 34.0 pg    MCHC 33.0 31.0 - 37.0 g/dL    RDW 13.0 (H) 86.5 - 15.0 %    MPV 7.8 7.0 - 10.0 fL    Platelet 171 150 - 440 10*9/L    Neutrophils % 84.5 %    Lymphocytes %  9.6 %    Monocytes % 4.4 %    Eosinophils % 0.3 %    Basophils % 0.2 %    Neutrophil Left Shift 1+ (A) Not Present    Absolute Neutrophils 7.5 2.0 - 7.5 10*9/L    Absolute Lymphocytes 0.9 (L) 1.5 - 5.0 10*9/L    Absolute Monocytes 0.4 0.2 - 0.8 10*9/L    Absolute Eosinophils 0.0 0.0 - 0.4 10*9/L    Absolute Basophils 0.0 0.0 - 0.1 10*9/L    Large Unstained Cells 1 0 - 4 %    Microcytosis Marked (A) Not Present    Anisocytosis Marked (A) Not Present   POCT Glucose    Collection Time: 08/16/19  9:37 AM   Result Value Ref Range    Glucose, POC 259 (H) 70 - 179 mg/dL   Tacrolimus Level, Trough    Collection Time: 08/16/19  9:46 AM   Result Value Ref Range    Tacrolimus, Trough 3.2 (L) 5.0 - 15.0 ng/mL   POCT Glucose    Collection Time: 08/16/19  2:52 PM   Result Value Ref Range Glucose, POC 247 (H) 70 - 179 mg/dL     ========================================      Emmie Niemann, MS3    I attest that I have reviewed the student note and that the components of the history of the present illness, the physical exam, and the assessment and plan documented were performed by me or were performed in my presence by the student where I verified the documentation and performed (or re-performed) the exam and medical decision making.       Gildardo Griffes, MD  Pediatrics, PGY-3  Pager: [phone]
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
## 2019-08-16 NOTE — Unmapped (Signed)
Dermatology Progress Note     Assessment and Plan:     Suspect excoriated papule on the L posterior neck, no suppuration   - would recommend starting mupirocin ointment BID to lesion until healed   - will continue to closely monitor and if enlarging, actively evolving or developing new lesions with consider skin biopsy for cultures to rule out infection.     Impetigo of the R superior auricular sulcus, stable  - aerobic culture with NGTD  - continue mupirocin ointment BID to AA.  ??  Thank you for the consult. Please page [phone] with any questions or concerns.    Subjective:   R ear fissure stable with topical mupirocin. No increased pain or purulence. Primary team asking for evaluation of lesion on the L posterior neck. This was noticed by the patient yesterday during a shower. Not pruritic or tender. Unsure if he scratched it. No recent fevers and no leukocytosis.    Preliminary aerobic culture from R ear lesion with NGTD.     ROS:   No fevers, chills, headaches, joint pains or other skin concerns.     Objective:     Vitals:    08/15/19 2300 08/15/19 2359 08/16/19 0828 08/16/19 1151   BP: 116/64 114/67 110/52 (P) 121/65   Pulse:  70 72 (P) 71   Resp:   18 (P) 20   Temp: 36.9 ??C 36.9 ??C 36.8 ??C (P) 36.8 ??C   TempSrc: Oral Oral Oral (P) Oral   SpO2: 99% 100% 99% (P) 100%   Weight:   61.3 kg (135 lb 2.3 oz)    Height:           Temp (24hrs), Avg:36.9 ??C, Min:36.8 ??C, Max:37.2 ??C      PHYSICAL EXAM:  GEN: Well-appearing in NAD  NEURO: Alert and oriented  SKIN: Examination of the scalp, face, neck, chest, back, bilateral upper lower extremities was performed today and unremarkable except as below:  - L posterior neck with a small sub centimeter ulceration without surrounding erythema, fluctuance or purulence  -  R superior auricular sulcus with fissure, no purulence  - All other areas not specifically commented on are within normal limits.    STUDIES:    Labs:  Lab Results   Component Value Date    NA 137 08/16/2019 K 4.5 08/16/2019    CL 108 (H) 08/16/2019    CO2 26.0 08/16/2019    ANIONGAP 3 (L) 08/16/2019    BUN 22 (H) 08/16/2019    CREATININE 1.09 08/16/2019      Lab Results   Component Value Date    AST 248 (H) 08/16/2019    ALT 498 (H) 08/16/2019    ALKPHOS 851 (H) 08/16/2019    GGT 431 (H) 08/15/2019    ALB 4.1 02/11/2019       Lab Results   Component Value Date    WBC 8.9 08/16/2019    HGB 9.4 (L) 08/16/2019    HCT 28.5 (L) 08/16/2019    MCV 79.6 (L) 08/16/2019    PLT 171 08/16/2019    LYMPHSABS 0.9 (L) 08/16/2019    MONOSABS 0.4 08/16/2019    EOSABS 0.0 08/16/2019    BASOSABS 0.0 08/16/2019

## 2019-08-16 NOTE — Unmapped (Signed)
Afebrile, VSS on room air. Atgam completed @2357 . PICC C/D/I, dressing changed. Infusing KVO. UOP adequate, BM x1. Pt is eating and drinking. 1.5 units of insulin given for bedtime glucose 270. Will continue to monitor and follow plan of care.    Problem: Adult Inpatient Plan of Care  Goal: Plan of Care Review  Outcome: Progressing  Goal: Patient-Specific Goal (Individualization)  Outcome: Progressing  Goal: Absence of Hospital-Acquired Illness or Injury  Outcome: Progressing  Goal: Optimal Comfort and Wellbeing  Outcome: Progressing  Goal: Readiness for Transition of Care  Outcome: Progressing  Goal: Rounds/Family Conference  Outcome: Progressing     Problem: Wound  Goal: Optimal Wound Healing  Outcome: Progressing     Problem: Fall Injury Risk  Goal: Absence of Fall and Fall-Related Injury  Outcome: Progressing     Problem: Self-Care Deficit  Goal: Improved Ability to Complete Activities of Daily Living  Outcome: Progressing

## 2019-08-16 NOTE — Unmapped (Signed)
Pt afebrile and VSS this shift. Pt did not complain of pain. No PRN meds administered. PIV removed. PICC in place. Site and dressing clean, dry, and intact. Fluids infusing and pt receiving Atgam. Pt eating, drinking, and voiding well. ACHS blood sugars checked and insulin administered. Pt resting in bed, no family at bedside. Will continue to monitor pt and follow POC.

## 2019-08-16 NOTE — Unmapped (Signed)
Pediatric Tacrolimus Therapeutic Monitoring Pharmacy Note  ??  Lilton??[name]??is a 18 y.o.??male??continuing??tacrolimus.   ??  Indication:??Liver transplant??  ??  Date of Transplant: 09/22/2016??  ??  Current Dosing Information: Tacrolimus??0.25 mg QAM / 0.5 mg QPM  ??  Dosing Weight: 55.9 kg  ??  Goals:  Therapeutic Drug Levels  Tacrolimus trough goal: 5-7 ng/mL  ??  Additional Clinical Monitoring/Outcomes  ?? Monitor renal function (SCr and urine output) and liver function (LFTs)  ?? Monitor for signs/symptoms of adverse events (e.g., hyperglycemia, hyperkalemia, hypomagnesemia, hypertension, headache, tremor)  ??  Results:   Tacrolimus level:??3.2??ng/mL, level drawn ~12 hours after previous dose and is approximately at steady steady state.   ??  Longitudinal Dose Monitoring:  Date Dose (mg)  AM / PM AM Scr (mg/dL) Level  (ng/mL) Key Drug Interactions   08/17/19 0.5 mg /       08/16/19 0.25 mg / 0.5 mg 1.09 3.2 None   08/15/19 0.25 mg/0.5 mg 1.04 4.2 None   08/14/19 0.25 mg/0.5 mg 1.06 4.4 None   08/13/19 0.25 mg/0.25 mg 1.28 5.5 None   08/12/19 --- / 0.25 mg 1.25 5.9 None   08/11/19 0.5 mg /??--- ??1.26 ??10.1 ??None   08/10/19 1 mg / HOLD 1.4 10.5 None   08/09/19 1 mg / 1 mg 1.29 --- None   08/08/19 3 mg / HOLD 1.5 11.9 None   08/06/19 5 mg / 3 mg 0.8 9 None   ??  Pharmacokinetic Considerations and Significant Drug Interactions:  ?? Concurrent hepatotoxic medications: None identified  ?? Concurrent CYP3A4 substrates/inhibitors: None identified  ?? Concurrent nephrotoxic medications: Valganciclovir, SMP/TMX,   ??  Assessment/Plan:  Trough level is subtherapeutic. SCr is trending down and LFTs remain elevated. U/O is steady.    ??  Recommendedation(s)  ?? Increase current dosing to 0.5 mg twice daily. Based on patients recent history of supratherapeutic levels would recommend increasing slowly.   ?? Order DAILY tacrolimus levels until consistently within goal range of 5-7.   ?? Note, patient started on thymoglobulin and high dose steroids 08/11/19  ??  Follow-up  ?? Next level ordered:??08/17/19 at 0930.  ?? A pharmacist will continue to monitor and recommend levels as appropriate  ??  Please page service pharmacist with questions/clarifications.    Kimber Relic, Pharm.D.

## 2019-08-17 LAB — COMPREHENSIVE METABOLIC PANEL
ALBUMIN: 2.2 g/dL — ABNORMAL LOW (ref 3.5–5.0)
ALKALINE PHOSPHATASE: 848 U/L — ABNORMAL HIGH (ref 65–260)
ALT (SGPT): 471 U/L — ABNORMAL HIGH (ref <50)
ANION GAP: 5 mmol/L — ABNORMAL LOW (ref 7–15)
AST (SGOT): 213 U/L — ABNORMAL HIGH (ref 19–55)
BILIRUBIN TOTAL: 18 mg/dL — ABNORMAL HIGH (ref 0.0–1.2)
BUN / CREAT RATIO: 23
CHLORIDE: 107 mmol/L (ref 98–107)
CO2: 27 mmol/L (ref 22.0–30.0)
CREATININE: 1.12 mg/dL (ref 0.70–1.30)
EGFR CKD-EPI AA MALE: >90 mL/min/{1.73_m2} (ref >=60)
EGFR CKD-EPI NON-AA MALE: >90 mL/min/{1.73_m2} (ref >=60)
GLUCOSE RANDOM: 207 mg/dL — ABNORMAL HIGH (ref 70–179)
POTASSIUM: 4.9 mmol/L (ref 3.5–5.0)
PROTEIN TOTAL: 6.1 g/dL — ABNORMAL LOW (ref 6.5–8.3)
SODIUM: 139 mmol/L (ref 135–145)

## 2019-08-17 LAB — CD8% T SUPPRESR": Lab: 7 — ABNORMAL LOW

## 2019-08-17 LAB — CBC W/ AUTO DIFF
BASOPHILS ABSOLUTE COUNT: 0 10*9/L (ref 0.0–0.1)
BASOPHILS RELATIVE PERCENT: 0.2 %
EOSINOPHILS ABSOLUTE COUNT: 0 10*9/L (ref 0.0–0.4)
EOSINOPHILS RELATIVE PERCENT: 0.1 %
HEMATOCRIT: 28.5 % — ABNORMAL LOW (ref 41.0–53.0)
HEMOGLOBIN: 9.2 g/dL — ABNORMAL LOW (ref 13.5–17.5)
LARGE UNSTAINED CELLS: 1 % (ref 0–4)
LYMPHOCYTES ABSOLUTE COUNT: 0.2 10*9/L — ABNORMAL LOW (ref 1.5–5.0)
LYMPHOCYTES RELATIVE PERCENT: 1.9 %
MEAN CORPUSCULAR HEMOGLOBIN CONC: 32.4 g/dL (ref 31.0–37.0)
MEAN CORPUSCULAR HEMOGLOBIN: 25.8 pg — ABNORMAL LOW (ref 26.0–34.0)
MEAN CORPUSCULAR VOLUME: 79.7 fL — ABNORMAL LOW (ref 80.0–100.0)
MEAN PLATELET VOLUME: 8.4 fL (ref 7.0–10.0)
MONOCYTES ABSOLUTE COUNT: 0.4 10*9/L (ref 0.2–0.8)
MONOCYTES RELATIVE PERCENT: 4.1 %
NEUTROPHILS ABSOLUTE COUNT: 8.4 10*9/L — ABNORMAL HIGH (ref 2.0–7.5)
NEUTROPHILS RELATIVE PERCENT: 93.1 %
RED BLOOD CELL COUNT: 3.57 10*12/L — ABNORMAL LOW (ref 4.50–5.90)
RED CELL DISTRIBUTION WIDTH: 24.5 % — ABNORMAL HIGH (ref 12.0–15.0)
WBC ADJUSTED: 9 10*9/L (ref 4.5–11.0)

## 2019-08-17 LAB — GAMMA GLUTAMYL TRANSFERASE: Gamma glutamyl transferase:CCnc:Pt:Ser/Plas:Qn:: 496 — ABNORMAL HIGH

## 2019-08-17 LAB — MAGNESIUM: Magnesium:MCnc:Pt:Ser/Plas:Qn:: 1.8

## 2019-08-17 LAB — TACROLIMUS, TROUGH: Lab: 3.5 — ABNORMAL LOW

## 2019-08-17 LAB — SODIUM: Sodium:SCnc:Pt:Ser/Plas:Qn:: 139

## 2019-08-17 LAB — PHOSPHORUS: Phosphate:MCnc:Pt:Ser/Plas:Qn:: 3.9

## 2019-08-17 LAB — CD3 TX WORKUP, FLOW
ABSOLUTE CD3 CNT: 32 {cells}/uL — ABNORMAL LOW (ref 915–3400)
ABSOLUTE CD4 CNT: <10 {cells}/uL — ABNORMAL LOW (ref 510–2320)
CD4% (T HELPER)": <1 % — ABNORMAL LOW (ref 34–58)
CD4:CD8 RATIO: <0.1 — ABNORMAL LOW (ref 0.9–4.8)

## 2019-08-17 LAB — NEUTROPHIL LEFT SHIFT

## 2019-08-17 LAB — GAMMA GT: GAMMA GLUTAMYL TRANSFERASE: 496 U/L — ABNORMAL HIGH (ref 12–109)

## 2019-08-17 LAB — HEMOGLOBIN A1C: Hemoglobin A1c/Hemoglobin.total:MFr:Pt:Bld:Qn:: 5

## 2019-08-17 LAB — SMOOTH MUSCLE ANTIBODY: Smooth muscle Ab:PrThr:Pt:Ser:Ord:IF: NEGATIVE
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
## 2019-08-17 NOTE — Unmapped (Signed)
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 Currently stable on 3 units TID AC with sliding scale. Not on insulin at home.   ??  Plan  -Continue 3units TIDAC lispro with meals   -continue sensitive lispro sliding scale  -obtain hemoglobin A1c      Primary team informed of recommendations. We will continue to follow patient as needed. Please call/page [phone] with questions or concerns.     Rosaria Ferries, MD  [facility] Endocrinology Fellow      Subjective/24 hour events:    Blood sugars improving over past 24 hours. Low 200s this morning    Objective: :  BP 115/62  - Pulse 79  - Temp 36.8 ??C (Oral)  - Resp 20  - Ht 174.6 cm (5' 8.74)  - Wt 61.3 kg (135 lb 2.3 oz)  - SpO2 99%  - BMI 20.11 kg/m??     Physical Exam:  General: NAD  Eyes: EOMI. Scleral icterus present  Lungs: No increased work of breathing.   Neuro: no focal deficits noted.  Extremities: ??Full ROM in all extremities.  Skin:WWP??  Test Results    Lab Results   Component Value Date    POCGLU 206 (H) 08/16/2019    POCGLU 171 08/16/2019    POCGLU 247 (H) 08/16/2019    POCGLU 259 (H) 08/16/2019    POCGLU 270 (H) 08/15/2019     Lab Results   Component Value Date    CREATININE 1.12 08/17/2019    NA 139 08/17/2019    K 4.9 08/17/2019    CL 107 08/17/2019    CO2 27.0 08/17/2019    ANIONGAP 5 (L) 08/17/2019    CALCIUM 8.0 (L) 08/17/2019    ALBUMIN 2.2 (L) 08/17/2019

## 2019-08-17 NOTE — Unmapped (Signed)
VSS. No complaints of pain or nausea. BS taken as ordered. Insulin coverage given as ordered. Bedtime BS 206, MD notified, no new orders. PICC c/d/I and infusing, labs drawn. Thymoglobulin infusion finished this evening, tolerated well. Bactroban applied to ear and back of neck. Voiding well, urine is amber in color. Mom called for an update, this RN did not provide any information because Patient's wishes not to have any contact with family or give any updates. Refer to password in his chart. Only [name], the Patient's friend, may call for updates. No visitors bedside, will continue to monitor.    Problem: Adult Inpatient Plan of Care  Goal: Plan of Care Review  Outcome: Ongoing - Unchanged  Goal: Patient-Specific Goal (Individualization)  Outcome: Ongoing - Unchanged  Goal: Absence of Hospital-Acquired Illness or Injury  Outcome: Ongoing - Unchanged  Goal: Optimal Comfort and Wellbeing  Outcome: Ongoing - Unchanged  Goal: Readiness for Transition of Care  Outcome: Ongoing - Unchanged  Goal: Rounds/Family Conference  Outcome: Ongoing - Unchanged     Problem: Wound  Goal: Optimal Wound Healing  Outcome: Ongoing - Unchanged     Problem: Fall Injury Risk  Goal: Absence of Fall and Fall-Related Injury  Outcome: Ongoing - Unchanged     Problem: Self-Care Deficit  Goal: Improved Ability to Complete Activities of Daily Living  Outcome: Ongoing - Unchanged

## 2019-08-17 NOTE — Unmapped (Signed)
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 We will continue to monitor for changes daily with the course of thyroglobulin/steroid treatment to be determined based on response. If we fail to see a response, we will continue to work-up for alternative diagnoses for liver dysfunction (repeat Anti-smooth muscle, liver-kidney microsome negative). His active issues are a tenuous fluid balance, balancing intravascular volume and edema, currently encouraging exclusive PO intake. He has recently recovered from ATN, with improved creatinine, however it reamins above his admission baseline. He recently received albumin infusion (10/4) for hypoalbuminemia and edema with some reponse. He has worsening ascites over the past few days, with a stable weight over the past 24 hours. We will encourage oral PO, and continue to monitor his fluid status for now. He has steroid-induced hyperglycemia, for which we have consulted adult Endocrine and started meal coverage and sliding scale lispro and will continue to monitor his sugars. His R ear and L posterior neck lesions are improving with Mupirocin. Remaining plan as follows:   ??  # Hx Liver Transplant - Subacute/Acute Liver Failure: ??Pt has biopsy confirmed severe acute rejection (9/25), RAI 9/9  - Day 7 of Thymoglobulin with MethylPrednisolone (10/1-)   - monitor labs daily for response (CBC, CMP, mag, phos, GGT)  - Continue tacrolimus 0.5 mg BID. Trough was decreased today. Will maintain current dose as it was just adjusted, per pharmacy rec  - Daily tacrolimus levels until consisently within goal range of 5-7 per pharmacy recommendations   - C4D staining of liver biopsy sent; will confirm with Surg Pathology 10/8 that it was sent  - Continue Valgancyclovir 450mg  daily (3months) and Bactrim MWF (66mo) for prophylaxis (10/2-)  - Continue on Nystatin 10ml TID (75mo) prophylaxis (10/2-)  - Mycophenolate 540mg  bid    - MELD score 24 - estimated 3 month mortality of 20%  - PICC line placed 9/28  ??  FEN/GI:elevated BUN, Cr above baseline (0.36->1.13), steroid induced hyperglycemia   - Monitor fluid status and daily weights  - Encouraging PO intake (>1.6L of fluids PO/day)   - STRICT I/Os    - Hyperglycemia    - Endo consulted 10/7    - Humalog 3 units w/meals, senstive sliding scale with meals only    - glucose checks qAC and HS  - Zofran??q8 hours PO PRN  - Protonix 40 mg PO daily  - Regular diet, ensure supplement BID  - Ergocalciferol qMon   - aquadex multivitamin    - Daily CMP, Mg, Ph    #Lesion on Right Ear and Left posterior Neck  -Dermatology consulted, appreciate recs  -Mupirocin ointment for both  lesions until healed, per derm rec  - f/u aerobic culture of ear lesion 10/5. Currently NGTD     #Constipation  -Patient states that he has not had a bowel movement in a few days  -Start Miralax 17 g BID PRN     #Deconditioning  -Patient endorses feeling weak and having difficulty in moving around  -PT consult  ??  # Abdominal Pain, dark stools: ??Unclear etiology, as FOBT x2 negative. ??One possibility is recent use of bismuth-containing medications. ??  - Perianal exam (10/1) within normal limits with mild perianal skin breakdown. No external hemorrhoids    #Severe Malnutrition:????Prior to admission, patient has been skipping meals due to poor appetite and food insecurity.  - Nutrition consult, appreciate recs  - Thiamine completed 9/26  - Multivitamin, Ca 300 mg BID, vit D 1000u QD   - Monitor phosphorous level  ??  # Major Depressive Disorder, insomnia: Stable. Has hx of attempted overdoses, with the last attempt in April 2019??at which point he was hospitalized in the Adolescent Psychiatry unit and discharged on Lexapro 20 mg. ??He has not engaged with mental health resources or taken medication for depression or insomnia in 8-10 months.????Denies SI, passive suicidality, self-harm, or HI at this time.   - adult psychiatry consulted, seen 10/6    - patient amenable to see adult psych   - Melatonin qHS    # Homelessness - Social Concerns:??Chip has experienced homelessness for ~5 months when he left his mother's house. ??He has severed all ties with his family and states his relationship with his mother cannot be reconciled. ??He also requested contact information of family members to be erased from his chart (this has been completed, but social worker did drop contact info into her note on 9/24 in case he changes his mind during admission). ??In addition to food insecurity, he has not been able to secure medications. ??He is routinely exposed to alcohol and drugs (marijuana, cocaine, etc.) second-hand through his friendships though he denies current use himself. ??On urine drug screen, he tested positive for marijuana only.  - Patient plans to be discharged to a local homeless shelter and begin job hunting  - SW will Chief Technology Officer for [employer] and homeless shelters in [place], [place], and [employer]  -Supportive care following. Would like [place] to work on naming a Runner, broadcasting/film/video in the event he cannot make decisions for himself. He has identified a few friends as possibilities.   ??  Access:??PIV, PICC (placed 9/28)  ??  Discharge criteria:??liver transplant work-up and treatment of acute rejection    Subjective:     Interval History: No acute events overnight. Patient states that abdominal pain and bloating decreased overnight, pt no longer having TTP. Patient reports that he has not had a bowel movement in a few days. Patient stated desire to work with PT to improve his strength and mobility. He continues to have nausea but says that he has been able to eat about the same amount of food and drink the same amount of fluids.     Objective:     Vital signs in last 24 hours:  Temp:  [36.7 ??C (98.1 ??F)-37.1 ??C (98.8 ??F)] 36.7 ??C (98.1 ??F)  Heart Rate:  [67-93] 76  SpO2 Pulse:  [85-150] 97  Resp:  [20-22] 20  BP: (102-122)/(51-79) 109/64  MAP (mmHg):  [68-91] 78  SpO2:  [97 %-100 %] 99 %  Intake/Output last 3 shifts:  I/O last 3 completed shifts:  In: 1309.1 [P.O.:480; I.V.:459.1; IV Piggyback:370]  Out: 2780 [Urine:2780]    Physical Exam:  General:   Laying in bed, sleepy when visited in the morning  Head: normocephalic, atraumatic.  Eyes: EOMI. Scleral icterus present.   Nose:   clear, no discharge  Lungs: clear breath sounds bilaterally. No increased work of breathing. No wheezes/crackles.  Heart: regular rate and rhythm. No murmurs appreciated on auscultation.  Abdomen:  Abdomen soft, distended, non-tender to palpation. Bowel sounds present.  Neuro: deconditioned, moves all extremities with decreased strength  .Extremities:. Patient appeared to be having bilateral UE tremors during exam. Non-pitting pedal edema.   Skin:WWP Skin lesion present on right ear and left neck, healing well.    Active Medications reviewed and KEY Medications include:     Current Facility-Administered Medications:   ???  acetaminophen (TYLENOL) tablet 650 mg, 650 mg, Oral, Once, Ciro Backer, MD  ???  antithymocyte globulin (rabbit) (THYMOGLOBULIN) 100 mg in sodium chloride (NS) 0.9 % 250 mL CENTRAL LINE formulation, 100 mg, Intravenous, Once, Ciro Backer, MD  ???  calcium carbonate (OS-CAL) tablet 300 mg elem calcium, 300 mg elem calcium, Oral, BID, Anastasia Pall, MD, 300 mg elem calcium at 08/17/19 0908  ???  calcium carbonate (TUMS) chewable tablet 300 mg elem calcium, 300 mg elem calcium, Oral, Once, Anastasia Pall, MD  ???  dextrose 50 % in water (D50W) 50 % solution 12.5 g, 12.5 g, Intravenous, Q10 Min PRN, Ejiofor A Lucky Cowboy., MD  ???  diphenhydrAMINE (BENADRYL) capsule/tablet 50 mg, 50 mg, Oral, Once, Ciro Backer, MD  ???  Implement, , , Until Discontinued **AND** Care order/instruction, , , PRN **AND** Vital signs, , , PRN **AND** Nursing oxygen orders / instructions, , , PRN **AND** sodium chloride (NS) 0.9 % infusion, 20 mL/hr, Intravenous, Continuous PRN **AND** sodium chloride 0.9% (NS) bolus 1,000 mL, 1,000 mL, Intravenous, Daily PRN **AND** diphenhydrAMINE (BENADRYL) injection 25 mg, 25 mg, Intravenous, Q4H PRN **AND** famotidine (PEPCID) injection 20 mg, 20 mg, Intravenous, Q4H PRN **AND** methylPREDNISolone sodium succinate (PF) (Solu-MEDROL) injection 125 mg, 125 mg, Intravenous, Q4H PRN **AND** EPINEPHrine (EPIPEN) injection 0.3 mg, 0.3 mg, Intramuscular, Daily PRN, Almon Register, MD  ???  ergocalciferol (DRISDOL) capsule 50,000 Units, 50,000 Units, Oral, Weekly, Ejiofor A Lucky Cowboy., MD, 50,000 Units at 08/15/19 [phone]  ???  heparin preservative-free injection 10 units/mL syringe (HEPARIN LOCK FLUSH), 20 Units, Intravenous, Daily PRN, Anastasia Pall, MD, 20 Units at 08/17/19 0914  ???  influenza vaccine quad (FLUARIX, FLULAVAL, FLUZONE) (6 MOS & UP) 2020-21, 0.5 mL, Intramuscular, During hospitalization, Anastasia Pall, MD  ???  insulin lispro (HumaLOG) injection 0-5 Units, 0-5 Units, Subcutaneous, TID AC, David Stall, MD, 2 Units at 08/17/19 1221  ???  insulin lispro (HumaLOG) injection 3 Units, 3 Units, Subcutaneous, TID AC, David Stall, MD, 3 Units at 08/17/19 1222  ???  melatonin tablet 3 mg, 3 mg, Oral, QPM, David Stall, MD, 3 mg at 08/16/19 2139  ???  methylPREDNISolone sodium succinate (PF) (Solu-MEDROL) injection 500 mg, 500 mg, Intravenous, Once, Ciro Backer, MD  ???  multivitamin, with zinc (AQUADEKS) chewable tablet, 2 tablet, Oral, Daily, Ejiofor A Lucky Cowboy., MD, 2 tablet at 08/17/19 9604  ???  mupirocin (BACTROBAN) 2 % ointment, , Topical, TID, David Stall, MD  ???  mycophenolate (MYFORTIC) EC tablet 540 mg, 540 mg, Oral, BID, Anastasia Pall, MD, 540 mg at 08/17/19 0910  ???  nystatin (MYCOSTATIN) oral  suspension, 1,000,000 Units, Oral, TID, Anastasia Pall, MD, 1,000,000 Units at 08/17/19 0912  ???  ondansetron (ZOFRAN) injection 4 mg, 4 mg, Intravenous, Once, Ejiofor A Lucky Cowboy., MD  ???  ondansetron (ZOFRAN-ODT) disintegrating tablet 4 mg, 4 mg, Oral, Q8H PRN, David Stall, MD  ???  pantoprazole (PROTONIX) EC tablet 40 mg, 40 mg, Oral, Daily, Almon Register, MD, 40 mg at 08/16/19 2139  ???  polyethylene glycol (MIRALAX) packet 17 g, 17 g, Oral, BID PRN, David Stall, MD  ???  sodium chloride 0.45% (1/2 NS) infusion, 10 mL/hr, Intravenous, Continuous, Val Eagle, Last Rate: 10 mL/hr at 08/14/19 2249, 10 mL/hr at 08/14/19 2249  ???  sulfamethoxazole-trimethoprim (BACTRIM) 400-80 mg tablet 80 mg of trimethoprim, 1 tablet, Oral, Q MWF, Ejiofor A Lucky Cowboy., MD, 80 mg of trimethoprim at 08/17/19 0913  ???  tacrolimus (PROGRAF) oral suspension, 0.5 mg, Oral, BID, Sallyanne Havers, MD, 0.5 mg at 08/17/19 1610  ???  valGANciclovir (VALCYTE) tablet 450 mg, 450 mg, Oral, Daily, Almon Register, MD, 450 mg at 08/17/19 0908        Studies: Personally reviewed and interpreted.  Labs/Studies:  Labs and Studies from the last 24hrs per EMR and Reviewed and   All lab results last 24 hours:    Recent Results (from the past 24 hour(s))   POCT Glucose    Collection Time: 08/16/19  2:52 PM Result Value Ref Range    Glucose, POC 247 (H) 70 - 179 mg/dL   POCT Glucose    Collection Time: 08/16/19  9:16 PM   Result Value Ref Range    Glucose, POC 171 70 - 179 mg/dL   POCT Glucose    Collection Time: 08/16/19 11:55 PM   Result Value Ref Range    Glucose, POC 206 (H) 70 - 179 mg/dL   Magnesium Level    Collection Time: 08/17/19  5:15 AM   Result Value Ref Range    Magnesium 1.8 1.6 - 2.2 mg/dL   Phosphorus Level    Collection Time: 08/17/19  5:15 AM   Result Value Ref Range    Phosphorus 3.9 2.9 - 4.7 mg/dL   Comprehensive Metabolic Panel    Collection Time: 08/17/19  5:15 AM   Result Value Ref Range    Sodium 139 135 - 145 mmol/L    Potassium 4.9 3.5 - 5.0 mmol/L    Chloride 107 98 - 107 mmol/L    Anion Gap 5 (L) 7 - 15 mmol/L    CO2 27.0 22.0 - 30.0 mmol/L    BUN 26 (H) 7 - 21 mg/dL    Creatinine 9.60 4.54 - 1.30 mg/dL    BUN/Creatinine Ratio 23     EGFR CKD-EPI Non-African American, Male >90 >=60 mL/min/1.66m2    EGFR CKD-EPI African American, Male >90 >=60 mL/min/1.8m2    Glucose 207 (H) 70 - 179 mg/dL    Calcium 8.0 (L) 8.5 - 10.2 mg/dL    Albumin 2.2 (L) 3.5 - 5.0 g/dL    Total Protein 6.1 (L) 6.5 - 8.3 g/dL    Total Bilirubin 09.8 (H) 0.0 - 1.2 mg/dL    AST 119 (H) 19 - 55 U/L    ALT 471 (H) <50 U/L    Alkaline Phosphatase 848 (H) 65 - 260 U/L   Gamma GT (GGT)    Collection Time: 08/17/19  5:15 AM   Result Value Ref Range    GGT 496 (H) 12 - 109 U/L  CBC w/ Differential    Collection Time: 08/17/19  5:15 AM   Result Value Ref Range    WBC 9.0 4.5 - 11.0 10*9/L    RBC 3.57 (L) 4.50 - 5.90 10*12/L    HGB 9.2 (L) 13.5 - 17.5 g/dL    HCT 84.6 (L) 96.2 - 53.0 %    MCV 79.7 (L) 80.0 - 100.0 fL    MCH 25.8 (L) 26.0 - 34.0 pg    MCHC 32.4 31.0 - 37.0 g/dL    RDW 95.2 (H) 84.1 - 15.0 %    MPV 8.4 7.0 - 10.0 fL    Platelet 153 150 - 440 10*9/L    Neutrophils % 93.1 %    Lymphocytes % 1.9 %    Monocytes % 4.1 %    Eosinophils % 0.1 %    Basophils % 0.2 %    Neutrophil Left Shift 1+ (A) Not Present Absolute Neutrophils 8.4 (H) 2.0 - 7.5 10*9/L    Absolute Lymphocytes 0.2 (L) 1.5 - 5.0 10*9/L    Absolute Monocytes 0.4 0.2 - 0.8 10*9/L    Absolute Eosinophils 0.0 0.0 - 0.4 10*9/L    Absolute Basophils 0.0 0.0 - 0.1 10*9/L    Large Unstained Cells 1 0 - 4 %    Microcytosis Marked (A) Not Present    Anisocytosis Marked (A) Not Present    Hypochromasia Slight (A) Not Present   Hemoglobin A1c    Collection Time: 08/17/19  5:15 AM   Result Value Ref Range    Hemoglobin A1C 5.0 4.8 - 5.6 %    Estimated Average Glucose 97 mg/dL   Tacrolimus Level, Trough    Collection Time: 08/17/19  9:21 AM   Result Value Ref Range    Tacrolimus, Trough 3.5 (L) 5.0 - 15.0 ng/mL   POCT Glucose    Collection Time: 08/17/19 12:13 PM   Result Value Ref Range    Glucose, POC 228 (H) 70 - 179 mg/dL     ========================================      Emmie Niemann, MS3    I attest that I have reviewed the student note and that the components of the history of the present illness, the physical exam, and the assessment and plan documented were performed by me or were performed in my presence by the student where I verified the documentation and performed (or re-performed) the exam and medical decision making.     Gildardo Griffes, MD  Pediatrics, PGY-3  Pager: [phone]

## 2019-08-17 NOTE — Unmapped (Signed)
Dermatology Progress Note     Assessment and Plan:     Impetigo of the R superior auricular sulcus, crusted papule on the L neck   - lesions improving with application of topical mupirocin, no indication for biopsy at this time  - continue topical mupirocin BID to these areas until healed then stop  - please let us know if he develops any new concerning skin lesions and we will be happy to reevaluated.     Thank you for the consult. Dermatology will sign off. Please page [phone] should any additional questions arise or change in patients status requiring re-evaluation.    Subjective:     No pain or drainage from areas on the R ear or L neck. No new areas of concern today. Areas are being treated with mupirocin ointment BID.     ROS:   A balance of 10 systems was reviewed and negative.     Objective:     Vitals:    08/16/19 2200 08/16/19 2232 08/16/19 2348 08/17/19 0756   BP: 117/64 116/60 109/51 115/62   Pulse: 75 93 74 79   Resp: 20 20 20 20    Temp: 37.1 ??C 37 ??C 36.9 ??C 36.8 ??C   TempSrc: Oral Oral Oral Oral   SpO2: 98% 99% 99% 99%   Weight:       Height:           Temp (24hrs), Avg:36.9 ??C, Min:36.8 ??C, Max:37.1 ??C      PHYSICAL EXAM:  GEN: Well-appearing in NAD  NEURO: Alert and oriented  SKIN: Examination of the scalp, face, neck, chest, upper back, bilateral upper extremities including palms, soles, and nails was performed today and unremarkable except as below:  - dry crusted plaque on the superior auricular sulcus   - crusted papule on the L posterior neck  - All other areas not specifically commented on are within normal limits.    STUDIES:    Labs:  Lab Results   Component Value Date    NA 139 08/17/2019    K 4.9 08/17/2019    CL 107 08/17/2019    CO2 27.0 08/17/2019    ANIONGAP 5 (L) 08/17/2019    BUN 26 (H) 08/17/2019    CREATININE 1.12 08/17/2019      Lab Results   Component Value Date    AST 213 (H) 08/17/2019    ALT 471 (H) 08/17/2019    ALKPHOS 848 (H) 08/17/2019    GGT 496 (H) 08/17/2019    ALB 4.1 02/11/2019       Lab Results   Component Value Date    WBC 9.0 08/17/2019    HGB 9.2 (L) 08/17/2019    HCT 28.5 (L) 08/17/2019    MCV 79.7 (L) 08/17/2019    PLT 153 08/17/2019    LYMPHSABS 0.2 (L) 08/17/2019    MONOSABS 0.4 08/17/2019    EOSABS 0.0 08/17/2019    BASOSABS 0.0 08/17/2019

## 2019-08-17 NOTE — Unmapped (Signed)
Pediatric Tacrolimus Therapeutic Monitoring Pharmacy Note  ??  [name] Grossis a 18 y.o.??male??continuing??tacrolimus.   ??  Indication:??Liver transplant??  ??  Date of Transplant: 09/22/2016??  ??  Current Dosing Information: Tacrolimus??0.5 mg PO BID  ??  Dosing Weight: 55.9 kg  ??  Goals:  Therapeutic Drug Levels  Tacrolimus trough goal: 5-7 ng/mL  ??  Additional Clinical Monitoring/Outcomes  ?? Monitor renal function (SCr and urine output) and liver function (LFTs)  ?? Monitor for signs/symptoms of adverse events (e.g., hyperglycemia, hyperkalemia, hypomagnesemia, hypertension, headache, tremor)  ??  Results:   Tacrolimus level:??3.4??ng/mL, level drawn ~11.5 hours after previous dose and is approximately at steady steady state.??  ??  Longitudinal Dose Monitoring:  Date Dose (mg)  AM / PM AM Scr (mg/dL) Level  (ng/mL) Key Drug Interactions   08/17/19 0.5 mg /  ??1.12 3.4 None   08/16/19 0.25 mg / 0.5 mg 1.09 3.2 None   08/15/19 0.25 mg/0.5 mg 1.04 4.2 None   08/14/19 0.25 mg/0.5 mg 1.06 4.4 None   08/13/19 0.25 mg/0.25 mg 1.28 5.5 None   08/12/19 --- / 0.25 mg 1.25 5.9 None   08/11/19 0.5 mg /??--- ??1.26 ??10.1 ??None   08/10/19 1 mg / HOLD 1.4 10.5 None   08/09/19 1 mg / 1 mg 1.29 --- None   08/08/19 3 mg / HOLD 1.5 11.9 None   08/06/19 5 mg / 3 mg 0.8 9 None   ??  Pharmacokinetic Considerations and Significant Drug Interactions:  ?? Concurrent hepatotoxic medications: None identified  ?? Concurrent CYP3A4 substrates/inhibitors: None identified  ?? Concurrent nephrotoxic medications: Valganciclovir, SMP/TMX,   ??  Assessment/Plan:  ?? Trough level??is subtherapeutic but dose??was just increased yesterday.   ??  Recommendedation(s)  ?? Maintain current dosing to 0.5 mg twice daily.??Based on patients recent history of supratherapeutic levels would recommend increasing slowly.   ?? Order DAILY tacrolimus levels until consistently within goal range of 5-7.   ?? Note, patient started on thymoglobulin and high dose steroids 08/11/19  ??  Follow-up ?? Next level ordered:??08/18/19 at 0930.  ?? A pharmacist will continue to monitor and recommend levels as appropriate  ??  Please page service pharmacist with questions/clarifications.    Mabeline Caras PharmD, BCPPS

## 2019-08-17 NOTE — Unmapped (Addendum)
Afebrile, VSS. No complaints of pain or N/V. Patient reports that anal fissure has improved. No PRNs given during shift. Patient met shift goal for PO intake, and is voiding well. BM x1 during shift. Guaze dressing on side is CDI. R ear laceration is crusting, seen by derm today. Bactroban applied to site. Neck laceration also crusting, and seen by derm. Bactroban applied to site. Edema in feet remains non-pitting. PT consult ordered for patient per patients request. BS obtained prior to meals and insulin administered per order. R arm PICC CDI and infusing Thymoglobulin. No visitors at bedside, no one called for updates during shift. WCTM and continue with POC.    Problem: Adult Inpatient Plan of Care  Goal: Plan of Care Review  Outcome: Progressing  Goal: Patient-Specific Goal (Individualization)  Outcome: Progressing  Goal: Absence of Hospital-Acquired Illness or Injury  Outcome: Progressing  Goal: Optimal Comfort and Wellbeing  Outcome: Progressing  Goal: Readiness for Transition of Care  Outcome: Progressing  Goal: Rounds/Family Conference  Outcome: Progressing     Problem: Wound  Goal: Optimal Wound Healing  Outcome: Progressing     Problem: Fall Injury Risk  Goal: Absence of Fall and Fall-Related Injury  Outcome: Progressing     Problem: Self-Care Deficit  Goal: Improved Ability to Complete Activities of Daily Living  Outcome: Progressing

## 2019-08-18 LAB — CBC W/ AUTO DIFF
BASOPHILS RELATIVE PERCENT: 0.1 %
EOSINOPHILS ABSOLUTE COUNT: 0 10*9/L (ref 0.0–0.4)
EOSINOPHILS RELATIVE PERCENT: 0 %
HEMATOCRIT: 28.6 % — ABNORMAL LOW (ref 41.0–53.0)
LARGE UNSTAINED CELLS: 1 % (ref 0–4)
LYMPHOCYTES ABSOLUTE COUNT: 0.2 10*9/L — ABNORMAL LOW (ref 1.5–5.0)
LYMPHOCYTES RELATIVE PERCENT: 3 %
MEAN CORPUSCULAR HEMOGLOBIN CONC: 34 g/dL (ref 31.0–37.0)
MEAN CORPUSCULAR HEMOGLOBIN: 27.2 pg (ref 26.0–34.0)
MEAN CORPUSCULAR VOLUME: 80.1 fL (ref 80.0–100.0)
MEAN PLATELET VOLUME: 8.1 fL (ref 7.0–10.0)
MONOCYTES ABSOLUTE COUNT: 0.3 10*9/L (ref 0.2–0.8)
MONOCYTES RELATIVE PERCENT: 3.4 %
NEUTROPHILS ABSOLUTE COUNT: 7.4 10*9/L (ref 2.0–7.5)
NEUTROPHILS RELATIVE PERCENT: 93 %
PLATELET COUNT: 140 10*9/L — ABNORMAL LOW (ref 150–440)
RED BLOOD CELL COUNT: 3.56 10*12/L — ABNORMAL LOW (ref 4.50–5.90)
RED CELL DISTRIBUTION WIDTH: 24.2 % — ABNORMAL HIGH (ref 12.0–15.0)
WBC ADJUSTED: 8 10*9/L (ref 4.5–11.0)

## 2019-08-18 LAB — COMPREHENSIVE METABOLIC PANEL
ALBUMIN: 2.3 g/dL — ABNORMAL LOW (ref 3.5–5.0)
ALT (SGPT): 442 U/L — ABNORMAL HIGH (ref <50)
ANION GAP: 11 mmol/L (ref 7–15)
AST (SGOT): 202 U/L — ABNORMAL HIGH (ref 19–55)
BILIRUBIN TOTAL: 20.1 mg/dL — ABNORMAL HIGH (ref 0.0–1.2)
BLOOD UREA NITROGEN: 29 mg/dL — ABNORMAL HIGH (ref 7–21)
BUN / CREAT RATIO: 26
CALCIUM: 8 mg/dL — ABNORMAL LOW (ref 8.5–10.2)
CHLORIDE: 104 mmol/L (ref 98–107)
CO2: 22 mmol/L (ref 22.0–30.0)
CREATININE: 1.13 mg/dL (ref 0.70–1.30)
EGFR CKD-EPI AA MALE: >90 mL/min/{1.73_m2} (ref >=60)
GLUCOSE RANDOM: 260 mg/dL — ABNORMAL HIGH (ref 70–179)
POTASSIUM: 4.8 mmol/L (ref 3.5–5.0)
PROTEIN TOTAL: 6.1 g/dL — ABNORMAL LOW (ref 6.5–8.3)
SODIUM: 137 mmol/L (ref 135–145)

## 2019-08-18 LAB — BILIRUBIN DIRECT: Bilirubin.glucuronidated+Bilirubin.albumin bound:MCnc:Pt:Ser/Plas:Qn:: 18 — ABNORMAL HIGH

## 2019-08-18 LAB — ANTI-MITOCHONDRIAL LEVEL: Lab: 6.1

## 2019-08-18 LAB — RETICULOCYTE COUNT PCT: Lab: 2.4

## 2019-08-18 LAB — LACTATE DEHYDROGENASE: Lactate dehydrogenase:CCnc:Pt:Ser/Plas:Qn:Reaction: pyruvate to lactate: 950 — ABNORMAL HIGH

## 2019-08-18 LAB — BASOPHILS RELATIVE PERCENT: Basophils/100 leukocytes:NFr:Pt:Bld:Qn:Automated count: 0.1

## 2019-08-18 LAB — TACROLIMUS, TROUGH: Lab: 2.6 — ABNORMAL LOW

## 2019-08-18 LAB — GAMMA GLUTAMYL TRANSFERASE: Gamma glutamyl transferase:CCnc:Pt:Ser/Plas:Qn:: 557 — ABNORMAL HIGH

## 2019-08-18 LAB — MAGNESIUM
MAGNESIUM: 1.8 mg/dL (ref 1.6–2.2)
Magnesium:MCnc:Pt:Ser/Plas:Qn:: 1.8

## 2019-08-18 LAB — PHOSPHORUS: Phosphate:MCnc:Pt:Ser/Plas:Qn:: 3.4

## 2019-08-18 LAB — HAPTOGLOBIN: Haptoglobin:MCnc:Pt:Ser/Plas:Qn:: <20 — ABNORMAL LOW

## 2019-08-18 LAB — RETICULOCYTES: RETIC HGB CONTENT: 34 pg (ref 29.7–36.1)

## 2019-08-18 LAB — BLOOD UREA NITROGEN: Urea nitrogen:MCnc:Pt:Ser/Plas:Qn:: 29 — ABNORMAL HIGH

## 2019-08-18 NOTE — Unmapped (Signed)
Pediatric Palliative Care Consult Note:  ??  Gilford is a 18 y.o. male seen for pediatric palliative care consultation at the request of Dr. Curtis Sites for goals of care, advance care planning and family support  ??  Primary Care Provider: [name], MD  ??  History provided by: patient  ??  Assessment:  ??  Taelor is a 18 y.o. 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 His liver function has not yet responded to high dose steroids and thymoglobulin, which is concerning for possible need for another liver transplant if he could be eligible. Kavir would benefit from ongoing discussions about prognosis and plan of care. Our team appreciates that many conversations have been had with Berle about what he may be facing; however today he had some difficulty telling our team details about his diagnosis and what he may be facing. Dany assumed his liver must be responding to current treatment since he physically feels better.     Zahki has started to have conversations with his friend, [name], about naming him as a Runner, broadcasting/film/video. [name] was agreeable, however Adit has not yet discussed details of what this would entail, or the severity of his illness. Keyon is considering naming his maternal grandfather and an aunt as his two caregivers, however he has not yet reached out to any family members during this hospital stay. We appreciate that Frank is seeking autonomy, however he needs supportive caregivers, and to formally designate a surrogate decision maker (he does not wish for this to default to next of kin) during this time while he is critically ill and at risk for further decompensation.    ??  Summary of Discussion and Recommendations:   - Tyleek would benefit from another conversation with primary hepatologist (he listed both Dr. Melrose Nakayama and Dr. Darnelle Bos as physicians who have been following him for awhile) to discuss his current disease status and the range of possible outcomes. This information will be helpful Allie to have a better understanding of his disease and to have conversations with his potential surrogate decision maker  - Following this meeting, recommend Alanmichael have a conversation with surrogate decison maker, and complete legal paperwork to formally name a HCPOA if he is able to identify one   ??  1. SYMPTOMS:   - Nausea: Reported to be improved on current regimen.  Appetite also increased.  Has been able to choose from a variety of foods off menu.  - Insomnia: Scheduled melatonin nightly.  Earplugs available at bedside.  - Depression: History of suicide attempt in Apirl 2019, denied SI to other providers during this hospital stay. Adult psychiatry last saw Govanni 10/6 (he would benefit from therapy however this is not offered by adult providers in the hospital and Yahya stated that he was less open to pediatric providers based on previous experience/encounters). Librado should be connected to adult outpt therapy providers at time of discharge. Also consider re-connecting with adult inpt psychiatry if he continues to share paranoid thoughts (see interval events from today).   ??  2. GOALS:   - Kaveon hopes to make a recovery, find housing, and find a job. He is particularly interested in help finding housing in the area.   - Gopal has long term goals which include going back to school and becoming a Runner, broadcasting/film/video or nurse.   ??  3. DECISIONS:   -  Given risk of hepatic encephalopathy, and general risk of his condition deteriorating, recommended Christion think about who he would want to be his surrogate decision maker if he was unable to make decisions for himself (see ACP below)   - Loring likes to receive information directly. He has been told that since his liver function has not responded to treatment to date that he is at risk for needing another liver transplant; however he was not able to re-state this to our team today.   - Dartanyon has been told that if he would like to be a candidate for liver transplant, he needs social supports including stable housing, transportation, source of income, etc - all is to be determined. He is considering trying to engage his maternal grandparents and an aunt as caregivers but is still thinking about this.   - [facility] Decision Making Tool given and explained at a previous visit   ??  4. ADVANCE CARE PLANNING  - Reminded Huriel of the Voicing My Choices booklet and offered to assist him with completing book if wanted/needed. Bartholomew plans to work through [employer] My Choices more today   - Recommended Salih think about who he would want to be his surrogate decision maker if he was unable to make decisions for himself. Atari has started this conversation with a friend, [name], who is amenable but has not yet been given all of the information about what that role may look like and not updated on Danyell's current clinical status   - Code status: full code, not discussed during this consult   - Prognosis: Guarded. Medical team concerned that his liver function has not yet responded to high dose steroids and thymoglobulin. His MELD score has an associated 90 day mortality of ~20%.   ??  5. SUPPORT:   - Dondre describes limited personal supports including an aunt and maternal grandparents, and friends. He has not spoken to any family since being admitted to the hospital. He has reached out to two friends, and started having more conversations with his friend [place].    - Discussed hospital based resources including chaplains.  - Lukus agreed to begin SSI application as suggested by transplant social worker, as the process could take months to complete.  He has since decided to wait until he was on a secure network instead of the an open/public network at the hospital due to security concerns.  He was also told how to access his medical records.  ??  6. CARE COORDINATION:   - Care coordinated with primary team and nursing.   - Recommend meeting with key medical providers and support person(s) of Ariq's choice as we learn more about his condition and treatment options. He is in agreement that this would be helpful. Rafan started to reach out to potential supports/surrogate decision makers.     ??    Total time spent with patient for evaluation & management (excluding ACP documented separately): 45 minutes (11-11:45am).  Greater than 50% of this time spent on counseling/coordination of care: Yes.  See ACP Note from today for additional billable service: No, 0 minutes.     We appreciate the consult. The [facility] Supportive Care Team can be reached by pager [name]) or email ([email or web address]).   ??  ??  History of Present Illness:  Sneijder??is a 18 y.o.??male??with history primary sclerosing cholangitis s/p liver transplant (2017) with multiple??epsiodes??of rejection, depression (history of suicide attempt via over dose in April 2019)??who??was admitted with reports of??black stools, abdominal pain,  and icteric sclera??most likely due to acute liver transplant rejection/failure. His hospital course has been complicated by AKI, significant ascites. His liver function has not yet responded to pulse dose steroids, primary team concerned about overall prognosis and need for repeat liver transplant.   ??  Interval events:  Edwards denies any bothersome symptoms today. He feels a little fatigued from PT. He says he thinks the thymoglobulin is working because he is physically feeling better and because it has always worked in the past.      Tamika has reached out to his friend [name] and reports that he asked [name] to be a Runner, broadcasting/film/video (if I wrote some things down (referring to Voicing My Choices), and he needed to make decisions, and [name] said yes). We asked Husain if he had shared any information about his current condition and he says [name] has been his friend from before he received his initial diagnosis, so [name] has some idea of Akeem having a liver disease, etc. But Vasilios acknowledged that he didn't give [place] any more details.    When our team asked Ram how he would inform a friend about his current clinic status if they knew nothing about his prior liver disease, he acknowledged that he wouldn't know where to start or how to explain his current illness. Christopherjohn agreed that more information from his primary team would be helpful, including information about range of possible outcomes.     Kiree has continued to have many thoughts about reaching out to his family. He is hesitant, he is concerned that his maternal grandmother would share information with his mother. Beulah's relationship with his mother is unlikely to be easily repaired. Valente trusts his maternal grandfather and an aunt but not his maternal grandmother. If he chose maternal grandfather as a caregiver then it would involve living with his maternal grandmother and he isn't sure how that would work (how he could live with his maternal grandmother and not share information with her about his health). He discussed this with his friend [place] who encouraged him, and essentially said well if it's what you need to do, then you should do it.     Edker also shared a few paranoid thoughts with our team today. He received texts from his maternal grandfather but he didn't answer them because he is suspicious that someone is using his grandfather's phone since his grandfather doesn't usually text. He was also suspicious of a text from his mom's best friend (assumed it was his mom).     Allergies:  Bee pollen and Pollen extracts  ??  Medications:  Scheduled Meds:  ??? acetaminophen  650 mg Oral Once   ??? anti-thymocyte globulin (THYMOGLOBIN-rabbit) IVPB CENTRAL LINE FORMULATION  100 mg Intravenous Once   ??? calcium carbonate  300 mg elem calcium Oral BID   ??? calcium carbonate  300 mg elem calcium Oral Once   ??? diphenhydrAMINE  50 mg Oral Once   ??? ergocalciferol  50,000 Units Oral Weekly   ??? flu vacc [id number] 6mos up(PF)  0.5 mL Intramuscular During hospitalization   ??? insulin lispro  0-5 Units Subcutaneous ACHS   ??? insulin lispro  3 Units Subcutaneous TID AC   ??? melatonin  3 mg Oral QPM   ??? methylPREDNISolone sodium succinate  500 mg Intravenous Once   ??? multivitamin, with zinc  2 tablet Oral Daily   ??? mupirocin   Topical TID   ??? mycophenolate  540 mg Oral BID   ???  nystatin  1,000,000 Units Oral TID   ??? ondansetron  4 mg Intravenous Once   ??? pantoprazole  40 mg Oral Daily   ??? sulfamethoxazole-trimethoprim  1 tablet Oral Q MWF   ??? Tacrolimus  0.5 mg Oral BID   ??? valGANciclovir  450 mg Oral Daily     Continuous Infusions:  ??? sodium chloride     ??? sodium chloride 0.45 % 10 mL/hr (08/14/19 2249)     PRN Meds:.dextrose 50 % in water (D50W), Implement **AND** Care order/instruction **AND** Vital signs **AND** Nursing oxygen orders / instructions **AND** sodium chloride **AND** sodium chloride 0.9% **AND** diphenhydrAMINE **AND** famotidine **AND** methylPREDNISolone sodium succinate (PF) **AND** EPINEPHrine, heparin, porcine (PF), ondansetron, polyethylen glycol    Physical Exam:   Vitals:    08/18/19 1200   BP:    Pulse:    Resp:    Temp:    SpO2: (P) 100%   ??  Gen: Thin young man sitting up in chair, NAD.  HEENT: +scleral icterus.   Chest: normal work of breathing  Abd: protruberant  Psych: Wide range of speech patterns (whispering to pressured speech)   ??  Data: Reviewed test results in Epic.  ??  Harold Hedge, MD, MPH  PGY-2 Pediatrics          Teaching Physician Attestation:    I saw the patient with Dr. Alma Downs and counseled his as above. I discussed the findings, assessment and plan with Dr. Alma Downs and agree with the findings and plan as documented in Dr. Deanne Coffer note.    Waldon Reining, MD, MPH  Attending Physician, Children's Supportive Care Team

## 2019-08-18 NOTE — Unmapped (Signed)
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 We will continue to monitor for changes daily with the course of thyroglobulin/steroid treatment to be determined based on response. If we fail to see a response, we will continue to work-up for alternative diagnoses for liver dysfunction (repeat Anti-smooth muscle, liver-kidney microsome negative). His active issues are a tenuous fluid balance, balancing intravascular volume and edema, currently encouraging exclusive PO intake. He has recently recovered from ATN, with improved creatinine, however it reamins above his admission baseline. He recently received albumin infusion (10/4) for hypoalbuminemia and edema with some reponse. He has worsening ascites over the past few days, with a stable weight over the past 48 hours. MRCP performed(10/8) to assess for liver pathology in addition to acute rejection. We will encourage oral PO, and continue to monitor his fluid status. He has steroid-induced hyperglycemia, for which we have consulted adult Endocrine and started meal coverage and sliding scale lispro and will continue to monitor his sugars. His R ear and L posterior neck lesions are improving with Mupirocin. Remaining plan as follows:   ??  # Hx Liver Transplant - Subacute/Acute Liver Failure: ??Pt has biopsy confirmed severe acute rejection (9/25), RAI 9/9  - Day 8 of Thymoglobulin with MethylPrednisolone (10/1-)   - monitor labs daily for response (CBC, CMP, mag, phos, GGT)  - Continue tacrolimus 0.5 mg BID. Will consider increasing target trough goal to 8-10 ng/mL from current 5-7 ng/mL  - MRCP planned for today  - soluble liver antigen testing, antimitochondrial antibody, and haptoglobin ordered today  - C4D staining of liver biopsy sent 10/7. Confirmed with surg path  - consider rebiopsy of liver if LFTs remain elevated or uptrend  - Continue Valgancyclovir 450mg  daily (3months) and Bactrim MWF (74mo) for prophylaxis (10/2-)  - Continue on Nystatin 10ml TID (55mo) prophylaxis (10/2-)  - Mycophenolate 540mg  bid    - MELD score 24 - estimated 3 month mortality of 20%  - PICC line placed 9/28  ??  FEN/GI:elevated BUN, Cr above baseline (0.36->1.13), steroid induced hyperglycemia   - Monitor fluid status and daily weights  - Encouraging PO intake (>1.6L of fluids PO/day)   - STRICT I/Os    - Hyperglycemia    - Increase to 4 units TIDAC lispro w/meals   - Add PRN dose for overnight meal; if a normal sized meal give 4 units, if smaller meal give 2 units   - continue sliding scale with meals and in the evenings   - glucose checks qAC and HS  - Zofran??q8 hours PO PRN  - Protonix 40 mg PO daily  - Regular diet, ensure  supplement BID  - Trial of Glucerna shake instead of ensure for carbohydrate control. Advised pt he could continue getting ensure if he does not like the glucerna  - Ergocalciferol qMon   - aquadex multivitamin    - Daily CMP, Mg, Ph    #Lesion on Right Ear and Left posterior Neck  -Dermatology consulted, appreciate recs  -Mupirocin ointment for both lesions until healed, per derm rec  - f/u aerobic culture of ear lesion 10/5. Current 1+ S. aureus     #Constipation  -Continue Miralax 17 g BID PRN     #Deconditioning  -Patient endorses feeling weak and having difficulty in moving around  -PT following. Will work with pt 2-3x week during admission  ??  # Abdominal Pain, dark stools: ??Unclear etiology, as FOBT x2 negative. ??One possibility is recent use of bismuth-containing medications. ??  - Perianal exam (10/1) within normal limits with mild perianal skin breakdown. No external hemorrhoids    #Severe Malnutrition:????Prior to admission, patient has been skipping meals due to poor appetite and food insecurity.  - Nutrition consult, appreciate recs  - Thiamine completed 9/26  - Multivitamin, Ca 300 mg BID, vit D 1000u QD   - Monitor phosphorous level  ??  # Major Depressive Disorder, insomnia: Stable. Has hx of attempted overdoses, with the last attempt in April 2019??at which point he was hospitalized in the Adolescent Psychiatry unit and discharged on Lexapro 20 mg. ??He has not engaged with mental health resources or taken medication for depression or insomnia in 8-10 months.????Denies SI, passive suicidality, self-harm, or HI at this time.   - adult psychiatry consulted, seen 10/6    - patient amenable to see adult psych   - Melatonin qHS    # Homelessness - Social Concerns:??[name] Gross has experienced homelessness for ~5 months when he left his mother's house. ??He has severed all ties with his family and states his relationship with his mother cannot be reconciled. ??He also requested contact information of family members to be erased from his chart (this has been completed, but social worker did drop contact info into her note on 9/24 in case he changes his mind during admission). ??In addition to food insecurity, he has not been able to secure medications. ??He is routinely exposed to alcohol and drugs (marijuana, cocaine, etc.) second-hand through his friendships though he denies current use himself. ??On urine drug screen, he tested positive for marijuana only.  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 Would like [name] Gross to work on naming a Runner, broadcasting/film/video in the event he cannot make decisions for himself. He has identified a few friends as possibilities.   ??  Access:??PIV, PICC (placed 9/28)  ??  Discharge criteria:??liver transplant work-up and treatment of acute rejection    Subjective:     Interval History: No acute overnight events. Patient states that he feels about the same today as he did yesterday. Pt did have a bowel movement yesterday afternoon with solid, well-formed, brown colored stools and no blood. Pt had elevated blood glucose at 260 at 5:15am. He says he ate a tuna sandwhich around 3am yesterday. Says he typically eats around 3am,8am,12pm, 9pm daily. Pt says he feels his muscles have gotten weakers since hospital stay. Feels no pain or itchiness from skin lesions  on R ear and L  posterior neck     Objective:     Vital signs in last 24 hours:  Temp:  [36.5 ??C-37 ??C] 37 ??C  Heart Rate:  [65-106] 87  SpO2 Pulse:  [71] 71  Resp:  [19-25] 22  BP: (101-125)/(53-75) 104/62  MAP (mmHg):  [71-89] 74  SpO2:  [97 %-100 %] 100 %  Intake/Output last 3 shifts:  I/O last 3 completed shifts:  In: 2620 [P.O.:2176; I.V.:444]  Out: 4500 [Urine:4500]    Physical Exam:  General:   Laying in bed, awake and watching videos on phone when visited in the morning  Head: normocephalic, atraumatic.  Eyes: EOMI. Scleral icterus present.   Nose:   clear, no discharge  Lungs: clear breath sounds bilaterally. No increased work of breathing. No wheezes/crackles.  Heart: regular rate and rhythm. No murmurs appreciated on auscultation.  Abdomen:  Abdomen soft, distended, TTP in RUQ and RLQ. Bowel sounds present.  Neuro: deconditioned, moves all extremities with decreased strength .Extremities:. Patient appeared to be having bilateral UE tremors during exam. Non-pitting pedal edema.   Skin:WWP Skin lesion present on right ear and left neck, healing well.    Active Medications reviewed and KEY Medications include:     Current Facility-Administered Medications:   ???  calcium carbonate (OS-CAL) tablet 300 mg elem calcium, 300 mg elem calcium, Oral, BID, Anastasia Pall, MD, 300 mg elem calcium at 08/18/19 2152  ???  calcium carbonate (TUMS) chewable tablet 300 mg elem calcium, 300 mg elem calcium, Oral, Once, Anastasia Pall, MD  ???  dextrose 50 % in water (D50W) 50 % solution 12.5 g, 12.5 g, Intravenous, Q10 Min PRN, Ejiofor A Lucky Cowboy., MD  ???  Implement, , , Until Discontinued **AND** Care order/instruction, , , PRN **AND** Vital signs, , , PRN **AND** Nursing oxygen orders / instructions, , , PRN **AND** sodium chloride (NS) 0.9 % infusion, 20 mL/hr, Intravenous, Continuous PRN, Last Rate: 20 mL/hr at 08/19/19 0420, 20 mL/hr at 08/19/19 0420 **AND** sodium chloride 0.9% (NS) bolus 1,000 mL, 1,000 mL, Intravenous, Daily PRN **AND** diphenhydrAMINE (BENADRYL) injection 25 mg, 25 mg, Intravenous, Q4H PRN **AND** famotidine (PEPCID) injection 20 mg, 20 mg, Intravenous, Q4H PRN **AND** methylPREDNISolone sodium succinate (PF) (Solu-MEDROL) injection 125 mg, 125 mg, Intravenous, Q4H PRN **AND** EPINEPHrine (EPIPEN) injection 0.3 mg, 0.3 mg, Intramuscular, Daily PRN, Almon Register, MD  ???  ergocalciferol (DRISDOL) capsule 50,000 Units, 50,000 Units, Oral, Weekly, Ejiofor A Lucky Cowboy., MD, 50,000 Units at 08/15/19 [phone]  ???  heparin preservative-free injection 10 units/mL syringe (HEPARIN LOCK FLUSH), 20 Units, Intravenous, Daily PRN, Anastasia Pall, MD, 20 Units at 08/18/19 [phone]  ???  influenza vaccine quad (FLUARIX, FLULAVAL, FLUZONE) (6 MOS & UP) 2020-21, 0.5 mL, Intramuscular, During hospitalization, Anastasia Pall, MD  ???  insulin lispro (HumaLOG) injection 0-5 Units, 0-5 Units, Subcutaneous, ACHS, David Stall, MD, 3 Units at 08/18/19 [phone]  ???  insulin lispro (HumaLOG) injection 3 Units, 3 Units, Subcutaneous, TID AC, David Stall, MD, 3 Units at 08/18/19 0935  ???  melatonin tablet 3 mg, 3 mg, Oral, QPM, David Stall, MD, 3 mg at 08/18/19 2046  ???  multivitamin, with zinc (AQUADEKS) chewable tablet, 2 tablet, Oral, Daily, Ejiofor A Lucky Cowboy., MD, 2 tablet at 08/18/19 8119  ???  mupirocin (BACTROBAN) 2 % ointment, , Topical, TID, David Stall, MD  ???  mycophenolate (MYFORTIC) EC tablet 540 mg, 540 mg, Oral, BID, Anastasia Pall, MD, 540 mg at  08/18/19 2213  ???  nystatin (MYCOSTATIN) oral suspension, 1,000,000 Units, Oral, TID, Anastasia Pall, MD, 1,000,000 Units at 08/18/19 2046  ???  ondansetron (ZOFRAN) injection 4 mg, 4 mg, Intravenous, Once, Ejiofor A Lucky Cowboy., MD  ???  ondansetron (ZOFRAN-ODT) disintegrating tablet 4 mg, 4 mg, Oral, Q8H PRN, David Stall, MD  ???  pantoprazole (PROTONIX) EC tablet 40 mg, 40 mg, Oral, Daily, Almon Register, MD, 40 mg at 08/18/19 2151  ???  polyethylene glycol (MIRALAX) packet 17 g, 17 g, Oral, BID PRN, David Stall, MD  ???  sodium chloride 0.45% (1/2 NS) infusion, 10 mL/hr, Intravenous, Continuous, Val Eagle, Stopped at 08/18/19 2240  ???  sulfamethoxazole-trimethoprim (BACTRIM) 400-80 mg tablet 80 mg of trimethoprim, 1 tablet, Oral, Q MWF, Ejiofor A Lucky Cowboy., MD, 80 mg of trimethoprim at 08/17/19 0913  ???  tacrolimus (PROGRAF) oral suspension, 0.75 mg, Oral, BID, David Stall, MD, 0.75 mg at 08/18/19 2153  ???  valGANciclovir (VALCYTE) tablet 450 mg, 450 mg, Oral, Daily, Almon Register, MD, 450 mg at 08/17/19 1651        Studies: Personally reviewed and interpreted.  Labs/Studies:  Labs and Studies from the last 24hrs per EMR and Reviewed and   All lab results last 24 hours:    Recent Results (from the past 24 hour(s))   POCT Glucose    Collection Time: 08/18/19  9:19 AM   Result Value Ref Range    Glucose, POC 266 (H) 70 - 179 mg/dL   Tacrolimus Level, Trough    Collection Time: 08/18/19  9:35 AM   Result Value Ref Range    Tacrolimus, Trough 2.6 (L) 5.0 - 15.0 ng/mL   Haptoglobin    Collection Time: 08/18/19 11:36 AM   Result Value Ref Range    Haptoglobin <20 (L) 30 - 200 mg/dL   Lactate dehydrogenase    Collection Time: 08/18/19 11:36 AM   Result Value Ref Range    LDH 950 (H) 340 - 670 U/L   Antimitochondrial Antibody    Collection Time: 08/18/19 11:36 AM   Result Value Ref Range    Anti Mitochondrial Ab Negative Negative    Anti-Mitochondrial Level 6.1    Reticulocytes    Collection Time: 08/18/19 11:36 AM   Result Value Ref Range    Reticulocyte Auto % 2.4 0.5 - 2.7 %    Absolute Auto Reticulocyte 100.9 27.0 - 120.0 10*9/L    Retic HGB Content 34.0 29.7 - 36.1 pg   Direct Antiglobulin Test    Collection Time: 08/18/19 11:36 AM   Result Value Ref Range    Direct [place] Anti-Human Globulin NEG      ========================================      Emmie Niemann, MS3    ???I attest that I have reviewed the student note and that the components of the history of the present illness, the physical exam, and the assessment and plan documented were performed by me or were performed in my presence by the student where I verified the documentation and performed (or re-performed) the exam and medical decision making.???    Harlow Mares, MD  Med/Peds, PGY-1

## 2019-08-18 NOTE — Unmapped (Signed)
Endocrinology Consult - Follow Up Note    Requesting Attending Physician :  Ciro Backer,*  Service Requesting Consult : Ped Gastroenterology [facility])  Primary Care Provider: [name], MD  Outpatient Endocrinologist: [facility]    Assessment/Recommendations:    Glucocorticoids induced hyperglycemia- A1c 4.9% (10/2018)  Currently on methylpredisone 500mg  daily since 10/01. Currently stable on 3 units TID AC with sliding scale. Not on insulin at home.   ??  Plan  - Increase 4units TIDAC lispro with meals   - Can add PRN dose for overnight meal: if a normal sized meal give 4 units, if smaller give 2 units lispro  -continue sensitive lispro sliding scale with meals and in the evening.   - I would recommend sugar free drinks or water to meet his fluid goal. Encouraged decrease in sugar drinks.   ??  ??  Primary team informed of recommendations. We will continue to follow patient as needed. Please call/page [phone] with questions or concerns.   ??  T. Gillie Manners, MD  [facility] Endocrinology Fellow      Subjective/24 hour events:    Patient is doing well. Blood glucose levels still elevated in 200s overnight. He is eating well but also drinking a large amount of sugary drinks (juice, peach tea etc) because he is trying to reach a daily fluid goal.   Denies polyuria, polydipsia or abd pain.     Objective: :  BP 107/63  - Pulse 65  - Temp 36.8 ??C (Oral)  - Resp 20  - Ht 174.6 cm (5' 8.74)  - Wt 61.1 kg (134 lb 11.2 oz)  - SpO2 98%  - BMI 20.04 kg/m??     Physical Exam:  General:??NAD  Eyes: EOMI. Scleral icterus present  Lungs: No increased work of breathing.   Neuro: no focal deficits noted.  Extremities: ??Full ROM in all extremities.  Skin:WWP??    Test Results    Lab Results   Component Value Date    POCGLU 266 (H) 08/18/2019    POCGLU 222 (H) 08/17/2019    POCGLU 156 08/17/2019    POCGLU 228 (H) 08/17/2019    POCGLU 206 (H) 08/16/2019     Lab Results   Component Value Date    CREATININE 1.13 08/18/2019    NA 137 08/18/2019    K 4.8 08/18/2019    CL 104 08/18/2019    CO2 22.0 08/18/2019    ANIONGAP 11 08/18/2019    CALCIUM 8.0 (L) 08/18/2019    ALBUMIN 2.3 (L) 08/18/2019

## 2019-08-18 NOTE — Unmapped (Signed)
PHYSICAL THERAPY  Evaluation (08/18/19 1201)     Patient Name:  [name] Gross       Medical Record Number: [id number]   Date of Birth: 2001-05-03  Sex: Male            Treatment Diagnosis: [name] Gross is a 18 y.o. 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    ASSESSMENT  Problem List: Decreased endurance, Impaired balance, Decreased mobility, Fall Risk, Gait deviation, Decreased strength     Assessment : [name] Gross is a 18 y.o. 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 He presents to physical therapy with decreased functional mobility as compared to baseline. He tolerates OOB for ambulation x 200 ft and for completion of exercises and stretching for LE's. He has appropriate balance throughout with only 1 instance of a mild LOB from which he recovers with CGA only. He is educated on an HEP to maximize his strength and endurance, including seated and supine exercises, OOB to chair at least 2hrs/day, and ambulation with assist. He will benefit from skilled acute PT to progress and maximize functional mobility.     Today's Interventions: Evaluation, ther act, education on PT POC and HEP (seated/supine exercises, ambulation w/ assist, OOB to chair)    Personal Factors/Comorbidities Present: 3   Specific Comorbidities : h/o unconfirmed primary sclerosing cholangitis, h/o liver transplant (2017), multiple epsiodes of rejection   Examination of Body System: 3-5 elements   Body System: neuromuscular, cardiopulmonary, musculoskeletal   Clinical Decision Making: Moderate     PLAN  Planned Frequency of Treatment:  1x per day for: 2-3x week Planned Treatment Duration: duration of admission or until STGs met    Planned Interventions: Education - Patient, Functional mobility, Therapeutic activity, Therapeutic exercise, Stair training, Self-care / Home training, Gait training, Home exercise program, Endurance activities    Post-Discharge Physical Therapy Recommendations:  PT services not indicated  PT DME Recommendations: None           Goals:   Patient and Family Goals: start moving a little bit more    Long Term Goal #1: Pt will return to independent community ambulation in 1 month.    SHORT GOAL #1: Pt will be able to ambulate x 500 ft independently.              Time Frame : 2 weeks  SHORT GOAL #2: Pt will be able to navigate 1 flight of stairs with 0-1 handrail and SBA.              Time Frame : 2 weeks  SHORT GOAL #3: Pt will be independent and compliant with HEP.              Time Frame : 2 weeks    Prognosis:  Good  Barriers to Discharge: Other    SUBJECTIVE  Patient reports: RN and pt agreeable to PT. Pt very pleasant and happy to work with PT, notes ongoing cramping in calves with standing/ambulation to bathroom.  Current Functional Status: Activity Level: No Restrictions  Prior Functional Status: Pt reports independence w/ ambulation and functional mobility at baseline. Ambulating to/from  bathroom and to chair this admission.  Equipment available at home: None     Past Medical History:   Diagnosis Date   ??? 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 Depression    ??? Seasonal allergies    ??? Sickle cell trait (CMS-HCC)     Social History     Tobacco Use   ??? Smoking status: Never Smoker   ??? Smokeless tobacco: Never Used   Substance Use Topics   ??? Alcohol use: Never Alcohol/week: 0.0 standard drinks     Frequency: Never     Comment: 1/2 glass of wine on rare occasions      Past Surgical History:   Procedure Laterality Date   ??? FRENULECTOMY, LINGUAL     ??? PR ERCP REMOVE FOREIGN BODY/STENT BILIARY/PANC DUCT N/A 08/20/2017    Procedure: ENDOSCOPIC RETROGRADE CHOLANGIOPANCREATOGRAPHY (ERCP); W/ REMOVAL OF FOREIGN BODY/STENT FROM BILIARY/PANCREATIC DUCT(S);  Surgeon: Vonda Antigua, MD;  Location: [facility];  Service: Gastroenterology   ??? PR ERCP REMOVE FOREIGN BODY/STENT BILIARY/PANC DUCT N/A 06/04/2018    Procedure: ENDOSCOPIC RETROGRADE CHOLANGIOPANCREATOGRAPHY (ERCP); W/ REMOVAL OF FOREIGN BODY/STENT FROM BILIARY/PANCREATIC DUCT(S);  Surgeon: Chriss Driver, MD;  Location: [facility];  Service: Gastroenterology   ??? PR ERCP STENT PLACEMENT BILIARY/PANCREATIC DUCT N/A 11/21/2016    Procedure: ENDOSCOPIC RETROGRADE CHOLANGIOPANCREATOGRAPHY (ERCP); WITH PLACEMENT OF ENDOSCOPIC STENT INTO BILIARY OR PANCREATIC DUCT;  Surgeon: Vonda Antigua, MD;  Location: [facility];  Service: Gastroenterology   ??? PR ERCP,W/REMOVAL STONE,BIL/PANCR DUCTS N/A 08/20/2017    Procedure: ERCP; W/ENDOSCOPIC RETROGRADE REMOVAL OF CALCULUS/CALCULI FROM BILIARY &/OR PANCREATIC DUCTS;  Surgeon: Vonda Antigua, MD;  Location: [facility];  Service: Gastroenterology   ??? PR ERCP,W/REMOVAL STONE,BIL/PANCR DUCTS N/A 06/04/2018    Procedure: ERCP; W/ENDOSCOPIC RETROGRADE REMOVAL OF CALCULUS/CALCULI FROM BILIARY &/OR PANCREATIC DUCTS;  Surgeon: Chriss Driver, MD;  Location: [facility];  Service: Gastroenterology   ??? PR TRANSPLANT LIVER,ALLOTRANSPLANT N/A 09/22/2016    Procedure: LIVER ALLOTRANSPLANTATION; ORTHOTOPIC, PARTIAL OR WHOLE, FROM CADAVER OR LIVING DONOR, ANY AGE;  Surgeon: Doyce Loose, MD;  Location: MAIN OR [facility];  Service: Transplant   ??? PR TRANSPLANT,PREP DONOR LIVER, WHOLE N/A 09/22/2016    Procedure: [facility] STD PREP CAD DONOR WHOLE LIVER GFT PRIOR TNSPLNT,INC CHOLE,DISS/REM SURR TISSU WO TRISEG/LOBE SPLT;  Surgeon: Doyce Loose, MD;  Location: MAIN OR [facility];  Service: Transplant   ??? PR UPGI ENDOSCOPY W/US FN BX N/A 08/20/2017    Procedure: UGI W/ TRANSENDOSCOPIC ULTRASOUND GUIDED INTRAMURAL/TRANSMURAL FINE NEEDLE ASPIRATION/BIOPSY(S), ESOPHAGUS;  Surgeon: Vonda Antigua, MD;  Location: [facility];  Service: Gastroenterology   ??? PR UPPER GI ENDOSCOPY,BIOPSY N/A 07/15/2016    Procedure: UGI ENDOSCOPY; WITH BIOPSY, SINGLE OR MULTIPLE;  Surgeon: Arnold Long Mir, MD;  Location: PEDS PROCEDURE ROOM [facility];  Service: Gastroenterology   ??? PR UPPER GI ENDOSCOPY,CTRL BLEED Left 05/05/2016    Procedure: UGI ENDOSCOPY; WITH CONTROL OF BLEEDING, ANY METHOD;  Surgeon: Arnold Long Mir, MD;  Location: PEDS PROCEDURE ROOM [facility];  Service: Gastroenterology   ??? PR UPPER GI ENDOSCOPY,CTRL BLEED N/A 05/12/2016    Procedure: UGI ENDOSCOPY; WITH CONTROL OF BLEEDING, ANY METHOD;  Surgeon: Annie Paras, MD;  Location: CHILDRENS OR [facility];  Service: Gastroenterology    Family History  Problem Relation Age of Onset   ??? Diabetes Maternal Grandmother    ??? Diabetes Paternal Grandmother    ??? Hypertension Maternal Grandfather    ??? Hypertension Paternal Grandfather    ??? Asthma Brother    ??? Anesthesia problems Neg Hx         Allergies: Bee pollen and Pollen extracts     Objective Findings  Precautions / Restrictions  Precautions: Falls precautions  Weight Bearing Status: Non-applicable  Required Braces or Orthoses: Non-applicable    Communication Preference: Verbal   Pain Comments: no c/o pain, has mild nausea at end of session, RN made aware  Medical Tests / Procedures: reviewed EMR  Equipment / Environment: Vascular access (PIV, TLC, Port-a-cath, PICC)    At Rest: VSS  With Activity: VSS  Orthostatics: Asymptomatic    Living Situation  Living Environment: House  Lives With: Family Home Living: One level home, Stairs to enter without rails  Number of Stairs: 3     Cognition: Alert & oriented, age-appropriate  Visual / Perception Status: Grossly intact  Skin Inspection: Visible skin intact, noted abrasion to R ear    UE ROM: WFL  UE Strength: WFL grossly, pt moving B UE's against gravity  LE ROM: WFL  LE Strength: WFL, no instability or buckling noted                Coordination: Grossly intact   Proprioception: Grossly intact  Sensation: Denies abnormal sensation  Balance: Appropriate balance for seated, standing, and ambulation; 1 mild LOB noted with performing 360 turn that requires CGA but pt demonstrates other turning with no LOB   Posture: Mildly rounded shoulders and FHP  Other: Performed x 20 ankle pumps, x 5 LAQ's/marches, and calf stretching x 30 sec each LE  Bed Mobility: Independent supine <> sit with increased time  Transfers: SBA for sit <> stand   Gait  Level of Assistance: Standby assist, set-up cues, supervision of patient - no hands on  Assistive Device: None  Distance Ambulated (ft): 200 ft  Gait: Pt ambulates x 200 ft in hallway with SBA throughout with exception of 1 mild LOB in which he recovers with CGA (occurs while turning around); pt performs other turns without LOB. He has slow gait speed and shortened step length.  Stairs: NT         Physical Therapy Session Duration  PT Individual - Duration: 35    Medical Staff Made Aware: RN aware    I attest that I have reviewed the above information.  Signed: Curt Jews, PT  Filed 08/18/2019

## 2019-08-18 NOTE — Unmapped (Addendum)
VSS and afebrile on RA. PICC c/d/I  thymoglobulin finished around midnight, q 1 hour VS performed during infusion. AM labs drawn and hep locked. C/L changed. Pt expressed desire to work w/ PT today. ACHS accuchecks performed. CHG performed. Guaze dressing on side c/d/I. R ear / neck laceration crusting over, bactroban applied. BLE edematous. No visitors at bedside, no one called for updates. WCTM and follow POC.       Problem: Adult Inpatient Plan of Care  Goal: Plan of Care Review  Outcome: Ongoing - Unchanged  Goal: Patient-Specific Goal (Individualization)  Outcome: Ongoing - Unchanged  Goal: Absence of Hospital-Acquired Illness or Injury  Outcome: Ongoing - Unchanged  Goal: Optimal Comfort and Wellbeing  Outcome: Ongoing - Unchanged  Goal: Readiness for Transition of Care  Outcome: Ongoing - Unchanged  Goal: Rounds/Family Conference  Outcome: Ongoing - Unchanged     Problem: Wound  Goal: Optimal Wound Healing  Outcome: Ongoing - Unchanged     Problem: Fall Injury Risk  Goal: Absence of Fall and Fall-Related Injury  Outcome: Ongoing - Unchanged     Problem: Self-Care Deficit  Goal: Improved Ability to Complete Activities of Daily Living  Outcome: Ongoing - Unchanged

## 2019-08-18 NOTE — Unmapped (Signed)
Pediatric Tacrolimus Therapeutic Monitoring Pharmacy Note  ??  Yuuki??[name]??is a 18 y.o.??male??continuing??tacrolimus.   ??  Indication:??Liver transplant??  ??  Date of Transplant: 09/22/2016??  ??  Current Dosing Information: Tacrolimus??0.5 mg PO BID  ??  Dosing Weight: 55.9 kg  ??  Goals:  Therapeutic Drug Levels  Tacrolimus trough goal: increased today to 8-10 ng/mL  ??  Additional Clinical Monitoring/Outcomes  ?? Monitor renal function (SCr and urine output) and liver function (LFTs)  ?? Monitor for signs/symptoms of adverse events (e.g., hyperglycemia, hyperkalemia, hypomagnesemia, hypertension, headache, tremor)  ??  Results:   Tacrolimus level:??2.6??ng/mL, level drawn ~12 hours after previous dose and is approximately at steady steady state.??  ??  Longitudinal Dose Monitoring:  Date Dose (mg)  AM / PM AM Scr (mg/dL) Level  (ng/mL) Key Drug Interactions   08/18/19 0.5 mg 1.13 2.6 None   08/17/19 0.5 mg /  ??1.12 3.4 None   08/16/19 0.25 mg / 0.5 mg 1.09 3.2 None   08/15/19 0.25 mg/0.5 mg 1.04 4.2 None   08/14/19 0.25 mg/0.5 mg 1.06 4.4 None   08/13/19 0.25 mg/0.25 mg 1.28 5.5 None   08/12/19 --- / 0.25 mg 1.25 5.9 None   08/11/19 0.5 mg /??--- ??1.26 ??10.1 ??None   08/10/19 1 mg / HOLD 1.4 10.5 None   08/09/19 1 mg / 1 mg 1.29 --- None   08/08/19 3 mg / HOLD 1.5 11.9 None   08/06/19 5 mg / 3 mg 0.8 9 None   ??  Pharmacokinetic Considerations and Significant Drug Interactions:  ?? Concurrent hepatotoxic medications: None identified  ?? Concurrent CYP3A4 substrates/inhibitors: None identified  ?? Concurrent nephrotoxic medications: Valganciclovir, SMP/TMX  ??  Assessment/Plan:  ?? Trough level??is subtherapeutic.   ??  Recommendedation(s)  ?? Increase current dosing to 0.75 mg twice daily.??Based on patients recent history of supratherapeutic levels would recommend increasing slowly.   ?? Order DAILY tacrolimus levels until consistently within goal range of 8-10.   ?? Note, patient started on thymoglobulin and high dose steroids 08/11/19. ??  Follow-up  ?? Next level ordered:??08/19/19 at 0930.  ?? A pharmacist will continue to monitor and recommend levels as appropriate  ??  Please page service pharmacist with questions/clarifications.    Selmer Dominion, PharmD

## 2019-08-19 LAB — COMPREHENSIVE METABOLIC PANEL
ALBUMIN: 2.3 g/dL — ABNORMAL LOW (ref 3.5–5.0)
ALKALINE PHOSPHATASE: 778 U/L — ABNORMAL HIGH (ref 65–260)
ALT (SGPT): 458 U/L — ABNORMAL HIGH (ref <50)
ANION GAP: 6 mmol/L — ABNORMAL LOW (ref 7–15)
AST (SGOT): 202 U/L — ABNORMAL HIGH (ref 19–55)
BLOOD UREA NITROGEN: 27 mg/dL — ABNORMAL HIGH (ref 7–21)
BUN / CREAT RATIO: 28
CALCIUM: 8.1 mg/dL — ABNORMAL LOW (ref 8.5–10.2)
CHLORIDE: 108 mmol/L — ABNORMAL HIGH (ref 98–107)
CREATININE: 0.95 mg/dL (ref 0.70–1.30)
EGFR CKD-EPI AA MALE: >90 mL/min/{1.73_m2} (ref >=60)
EGFR CKD-EPI NON-AA MALE: >90 mL/min/{1.73_m2} (ref >=60)
GLUCOSE RANDOM: 344 mg/dL — ABNORMAL HIGH (ref 70–179)
POTASSIUM: 5 mmol/L (ref 3.5–5.0)
PROTEIN TOTAL: 6.3 g/dL — ABNORMAL LOW (ref 6.5–8.3)
SODIUM: 136 mmol/L (ref 135–145)

## 2019-08-19 LAB — CBC W/ AUTO DIFF
BASOPHILS ABSOLUTE COUNT: 0 10*9/L (ref 0.0–0.1)
BASOPHILS RELATIVE PERCENT: 0.1 %
EOSINOPHILS ABSOLUTE COUNT: 0 10*9/L (ref 0.0–0.4)
EOSINOPHILS RELATIVE PERCENT: 0.1 %
HEMATOCRIT: 30.3 % — ABNORMAL LOW (ref 41.0–53.0)
HEMOGLOBIN: 9.8 g/dL — ABNORMAL LOW (ref 13.5–17.5)
LARGE UNSTAINED CELLS: 0 % (ref 0–4)
LYMPHOCYTES ABSOLUTE COUNT: 0.3 10*9/L — ABNORMAL LOW (ref 1.5–5.0)
MEAN CORPUSCULAR HEMOGLOBIN: 26.2 pg (ref 26.0–34.0)
MEAN CORPUSCULAR VOLUME: 81.1 fL (ref 80.0–100.0)
MEAN PLATELET VOLUME: 8.7 fL (ref 7.0–10.0)
MONOCYTES ABSOLUTE COUNT: 0.3 10*9/L (ref 0.2–0.8)
MONOCYTES RELATIVE PERCENT: 3.9 %
NEUTROPHILS ABSOLUTE COUNT: 6.8 10*9/L (ref 2.0–7.5)
NEUTROPHILS RELATIVE PERCENT: 91.7 %
PLATELET COUNT: 114 10*9/L — ABNORMAL LOW (ref 150–440)
RED BLOOD CELL COUNT: 3.74 10*12/L — ABNORMAL LOW (ref 4.50–5.90)
RED CELL DISTRIBUTION WIDTH: 24.4 % — ABNORMAL HIGH (ref 12.0–15.0)
WBC ADJUSTED: 7.4 10*9/L (ref 4.5–11.0)

## 2019-08-19 LAB — GLUCOSE RANDOM: Glucose:MCnc:Pt:Ser/Plas:Qn:: 344 — ABNORMAL HIGH

## 2019-08-19 LAB — MEAN PLATELET VOLUME: Lab: 8.7

## 2019-08-19 LAB — TACROLIMUS, TROUGH: Lab: 3.4 — ABNORMAL LOW

## 2019-08-19 LAB — PHOSPHORUS: Phosphate:MCnc:Pt:Ser/Plas:Qn:: 3.7

## 2019-08-19 LAB — GAMMA GLUTAMYL TRANSFERASE: Gamma glutamyl transferase:CCnc:Pt:Ser/Plas:Qn:: 561 — ABNORMAL HIGH

## 2019-08-19 LAB — MAGNESIUM: Magnesium:MCnc:Pt:Ser/Plas:Qn:: 1.8

## 2019-08-19 NOTE — Unmapped (Signed)
Pediatric Tacrolimus Therapeutic Monitoring Pharmacy Note  ??  [name]??Gross??is a 18 y.o.??male??continuing??tacrolimus.   ??  Indication:??Liver transplant??  ??  Date of Transplant: 09/22/2016??  ??  Current Dosing Information: Tacrolimus??0.5 mg PO BID  ??  Dosing Weight: 55.9 kg  ??  Goals:  Therapeutic Drug Levels  Tacrolimus trough goal: increased today to 8-10 ng/mL  ??  Additional Clinical Monitoring/Outcomes  ?? Monitor renal function (SCr and urine output) and liver function (LFTs)  ?? Monitor for signs/symptoms of adverse events (e.g., hyperglycemia, hyperkalemia, hypomagnesemia, hypertension, headache, tremor)  ??  Results:   Tacrolimus level:??3.4??ng/mL, level drawn ~11.6 hours after previous dose and is approximately at steady steady state.??  ??  Longitudinal Dose Monitoring:  Date Dose (mg)  AM / PM AM Scr (mg/dL) Level  (ng/mL) Key Drug Interactions   08/19/19 0.75 mg / 0.75 mg 0.95 3.4 None   08/18/19 0.5 mg / 0.75 mg 1.13 2.6 None   08/17/19 0.5 mg / 0.5 mg ??1.12 3.4 None   08/16/19 0.25 mg / 0.5 mg 1.09 3.2 None   08/15/19 0.25 mg/0.5 mg 1.04 4.2 None   08/14/19 0.25 mg/0.5 mg 1.06 4.4 None   08/13/19 0.25 mg/0.25 mg 1.28 5.5 None   08/12/19 --- / 0.25 mg 1.25 5.9 None   08/11/19 0.5 mg /??--- ??1.26 ??10.1 ??None   08/10/19 1 mg / HOLD 1.4 10.5 None   08/09/19 1 mg / 1 mg 1.29 --- None   08/08/19 3 mg / HOLD 1.5 11.9 None   08/06/19 5 mg / 3 mg 0.8 9 None   ??  Pharmacokinetic Considerations and Significant Drug Interactions:  ?? Concurrent hepatotoxic medications: None identified  ?? Concurrent CYP3A4 substrates/inhibitors: None identified  ?? Concurrent nephrotoxic medications: Valganciclovir, SMP/TMX  ??  Assessment/Plan:  ?? Trough level??is subtherapeutic.   ??  Recommendedation(s)  ?? Continue current dose of 0.75 mg twice daily.??Based on patients recent history of supratherapeutic levels would recommend increasing slowly.   ?? Order DAILY tacrolimus levels until consistently within goal range of 8-10.   ?? Note, patient started on thymoglobulin and high dose steroids 08/11/19.  ??  Follow-up  ?? Next level ordered:??08/20/19 at 0930.  ?? A pharmacist will continue to monitor and recommend levels as appropriate  ??  Please page service pharmacist with questions/clarifications.    Kimber Relic, PharmD

## 2019-08-19 NOTE — Unmapped (Signed)
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 We will continue to monitor for changes daily with the course of thyroglobulin/steroid treatment to be determined based on response. If we fail to see a response, we will continue to work-up for alternative diagnoses for liver dysfunction (repeat Anti-smooth muscle, liver-kidney microsome negative). His active issues are a tenuous fluid balance, balancing intravascular volume and edema, currently encouraging exclusive PO intake. He has recently recovered from ATN, with improved creatinine, however it reamins above his admission baseline. He recently received albumin infusion (10/4) for hypoalbuminemia and edema with some reponse. He has worsening ascites over the past few days, with a stable weight over the past 48 hours. MRCP performed(10/8) to assess for liver pathology in addition to acute rejection. We will encourage oral PO, and continue to monitor his fluid status. He has steroid-induced hyperglycemia, for which we have consulted adult Endocrine and started meal coverage and sliding scale lispro and will continue to monitor his sugars. His R ear and L posterior neck lesions are improving with Mupirocin. Remaining plan as follows:   ??  # Hx Liver Transplant - Subacute/Acute Liver Failure: ??Pt has biopsy confirmed severe acute rejection (9/25), RAI 9/9. Labs today essentially unchanged w/out robust response to Thymo, however, overall trend of AST and alk phos during this admission has been downward.   - Day 9 of Thymoglobulin with MethylPrednisolone (10/1-)   - monitor labs daily for response (CBC, CMP, mag, phos, GGT)  - Tacro level increased 10/8. Continue tacrolimus 0.75 mg BID. Goal trough 8-10 ng/mL. Per discussion with nursing will hold on changing dose today and recheck level tomorrow  - MRCP today, central hepatic bile ducts slightly more prominent than previous w/out obvious stenosis or stricture; heterogeneous enhancement of liver, porta hepatis lymph nodes, large volume ascites   - soluble liver antigen testing, antimitochondrial antibody, and haptoglobin  - C4D staining of liver biopsy sent 10/7. Confirmed with surg path  - consider rebiopsy of liver if LFTs remain elevated or uptrend  - Continue Valgancyclovir 450mg  daily (3months) and Bactrim MWF (60mo) for prophylaxis (10/2-)  - Continue on Nystatin 10ml TID (80mo) prophylaxis (10/2-)  - Mycophenolate 540mg  bid    - MELD score 22 today  - PICC line placed 9/28    FEN/GI:elevated BUN, Cr above baseline (0.36->1.13), steroid induced hyperglycemia   - Monitor fluid status and daily weights  - Encouraging PO intake (>1.6L of fluids PO/day)   - STRICT I/Os    - Hyperglycemia    - Increase to 4 units TIDAC lispro w/meals   - Add PRN dose for overnight meal; if a normal  sized meal give 4 units, if smaller meal give 2 units   - continue sliding scale with meals and in the evenings   - glucose checks qAC and HS  - Zofran??q8 hours PO PRN  - Protonix 40 mg PO daily  - Regular diet  - Trial of Glucerna shake instead of ensure for carbohydrate control. Advised pt he could continue getting ensure if he does not like the glucerna  - Ergocalciferol qMon   - aquadex multivitamin    - Daily CMP, Mg, Ph    #Lesion on Right Ear and Left posterior Neck  -Dermatology consulted, appreciate recs  -Mupirocin ointment for both lesions until healed, per derm rec  - f/u aerobic culture of ear lesion 10/5. Current 1+ S. aureus     #Constipation  -Continue Miralax 17 g BID PRN     #Deconditioning  -Patient endorses feeling weak and having difficulty in moving around  -PT following. Will work with pt 2-3x week during admission  ??  # Abdominal Pain, dark stools (resolved): ??Unclear etiology, as FOBT x2 negative. ??One possibility is recent use of bismuth-containing medications. ??  - Perianal exam (10/1) within normal limits with mild perianal skin breakdown. No external hemorrhoids    #Severe Malnutrition:????Prior to admission, patient has been skipping meals due to poor appetite and food insecurity.  - Nutrition consult, appreciate recs  - Thiamine added back 10/8  - Multivitamin, Ca 300 mg BID, vit D 1000u QD   - Monitor phosphorous level  ??  # Major Depressive Disorder, insomnia: Stable. Has hx of attempted overdoses, with the last attempt in April 2019??at which point he was hospitalized in the Adolescent Psychiatry unit and discharged on Lexapro 20 mg. ??He has not engaged with mental health resources or taken medication for depression or insomnia in 8-10 months.????Denies SI, passive suicidality, self-harm, or HI at this time.   - adult psychiatry consulted, seen 10/6    - patient amenable to see adult psych   - Melatonin qHS    # Homelessness - Social Concerns:??Cheyne has experienced homelessness for ~5 months when he left his mother's house. ??He has severed all ties with his family and states his relationship with his mother cannot be reconciled. ??He also requested contact information of family members to be erased from his chart (this has been completed, but social worker did drop contact info into her note on 9/24 in case he changes his mind during admission). ??In addition to food insecurity, he has not been able to secure medications. ??He is routinely exposed to alcohol and drugs (marijuana, cocaine, etc.) second-hand through his friendships though he denies current use himself. ??On urine drug screen, he tested positive for marijuana only.  - Patient plans to be discharged to a local homeless shelter and begin job hunting  - SW will Chief Technology Officer for [employer] and homeless shelters in [place], [place], and [place]  -Supportive care following. Would like [place] to work on naming a Runner, broadcasting/film/video in the event he cannot make decisions for himself. He has identified a few friends as possibilities.   ??  Access:??PIV, PICC (placed 9/28)  ??  Discharge criteria:??liver transplant work-up and treatment of acute rejection    Subjective:     Interval History: Labs essentially unchanged, but overall trend of AST and Alk phos is downward since earlier in admission. Patient NPO throughout most of the night for MRCP. MRCP completed this morning, results as above. Dalonte states he is feeling okay  today. Has been working with PT, and feels a bit stronger. Plan for meeting today with supportive care at 5 pm      Objective:     Vital signs in last 24 hours:  Temp:  [36.5 ??C-37.1 ??C] 37.1 ??C  Heart Rate:  [70-106] 82  SpO2 Pulse:  [71] 71  Resp:  [20-25] 22  BP: (101-125)/(55-81) 109/57  MAP (mmHg):  [71-92] 72  SpO2:  [97 %-100 %] 100 %  Intake/Output last 3 shifts:  I/O last 3 completed shifts:  In: 1582.3 [P.O.:836; I.V.:436.3; IV Piggyback:310]  Out: 4200 [Urine:4200]    Physical Exam:  General:   Laying in bed, awake and watching videos on phone when visited in the morning  Head: normocephalic, atraumatic.  Eyes: EOMI. Scleral icterus present.   Nose:   clear, no discharge  Lungs: clear breath sounds bilaterally. No increased work of breathing. No wheezes/crackles.  Heart: regular rate and rhythm. No murmurs appreciated on auscultation.  Abdomen:  Abdomen soft, distended, TTP in RUQ and RLQ. Bowel sounds present.  Neuro: deconditioned, moves all extremities with decreased strength  .Extremities:. Patient appeared to be having bilateral UE tremors during exam. Non-pitting pedal edema.   Skin:WWP Skin lesion present on right ear and left neck, healing well.    Active Medications reviewed and KEY Medications include:     Current Facility-Administered Medications:   ???  acetaminophen (TYLENOL) tablet 650 mg, 650 mg, Oral, Once, Luz Lex, MD  ???  antithymocyte globulin (rabbit) (THYMOGLOBULIN) 100 mg in sodium chloride (NS) 0.9 % 250 mL CENTRAL LINE formulation, 100 mg, Intravenous, Once, Luz Lex, MD  ???  calcium carbonate (OS-CAL) tablet 300 mg elem calcium, 300 mg elem calcium, Oral, BID, Anastasia Pall, MD, 300 mg elem calcium at 08/19/19 1012  ???  calcium carbonate (TUMS) chewable tablet 300 mg elem calcium, 300 mg elem calcium, Oral, Once, Anastasia Pall, MD  ???  dextrose 50 % in water (D50W) 50 % solution 12.5 g, 12.5 g, Intravenous, Q10 Min PRN, Ejiofor A Lucky Cowboy., MD  ???  diphenhydrAMINE (BENADRYL) capsule/tablet 50 mg, 50 mg, Oral, Once, Prathipa Santhanam, MD  ???  Implement, , , Until Discontinued **AND** Care order/instruction, , , PRN **AND** Vital signs, , , PRN **AND** Nursing oxygen orders / instructions, , , PRN **AND** sodium chloride (NS) 0.9 % infusion, 20 mL/hr, Intravenous, Continuous PRN, Stopped at 08/19/19 0700 **AND** sodium chloride 0.9% (NS) bolus 1,000 mL, 1,000 mL, Intravenous, Daily PRN **AND** diphenhydrAMINE (BENADRYL) injection 25 mg, 25 mg, Intravenous, Q4H PRN **AND** famotidine (PEPCID) injection 20 mg, 20 mg, Intravenous, Q4H PRN **AND** methylPREDNISolone sodium succinate (PF) (Solu-MEDROL) injection 125 mg, 125 mg, Intravenous, Q4H PRN **AND** EPINEPHrine (EPIPEN) injection 0.3 mg, 0.3 mg, Intramuscular, Daily PRN, Almon Register, MD  ???  ergocalciferol (DRISDOL) capsule 50,000 Units, 50,000 Units, Oral, Weekly, Ejiofor A Lucky Cowboy., MD, 50,000 Units at 08/15/19 [phone]  ???  heparin preservative-free injection 10 units/mL syringe (HEPARIN LOCK FLUSH), 20 Units, Intravenous, Daily PRN, Anastasia Pall, MD, 20 Units at 08/19/19 0653  ???  influenza vaccine quad (FLUARIX, FLULAVAL, FLUZONE) (6 MOS & UP) 2020-21, 0.5 mL, Intramuscular, During hospitalization, Anastasia Pall, MD  ???  insulin lispro (HumaLOG) injection 0-5 Units, 0-5 Units, Subcutaneous, ACHS, David Stall, MD, 2 Units at 08/19/19 1514  ???  insulin lispro (HumaLOG) injection 4 Units, 4 Units, Subcutaneous, TID AC, Dannielle Huh, MD, 4 Units at 08/19/19 1513  ???  melatonin tablet 3  mg, 3 mg, Oral, QPM, David Stall, MD, 3 mg at 08/18/19 2046  ???  methylPREDNISolone sodium succinate (PF) (Solu-MEDROL) injection 500 mg, 500 mg, Intravenous, Once, Prathipa Santhanam, MD  ???  multivitamin, with zinc (AQUADEKS) chewable tablet, 2 tablet, Oral, Daily, Ejiofor A Lucky Cowboy., MD, 2 tablet at 08/19/19 0943  ???  mupirocin (BACTROBAN) 2 % ointment, , Topical, TID, David Stall, MD  ???  mycophenolate (MYFORTIC) EC tablet 540 mg, 540 mg, Oral, BID, Anastasia Pall, MD, 540 mg at 08/19/19 1011  ???  nystatin (MYCOSTATIN) oral suspension, 1,000,000 Units, Oral, TID, Anastasia Pall, MD, 1,000,000 Units at 08/19/19 1345  ???  ondansetron (ZOFRAN) injection 4 mg, 4 mg, Intravenous, Once, Ejiofor A Lucky Cowboy., MD  ???  ondansetron (ZOFRAN-ODT) disintegrating tablet 4 mg, 4 mg, Oral, Q8H PRN, David Stall, MD  ???  pantoprazole (PROTONIX) EC tablet 40 mg, 40 mg, Oral, Daily, Almon Register, MD, 40 mg at 08/18/19 2151  ???  polyethylene glycol (MIRALAX) packet 17 g, 17 g, Oral, BID PRN, David Stall, MD  ???  sodium chloride 0.45% (1/2 NS) infusion, 10 mL/hr, Intravenous, Continuous, Val Eagle, Stopped at 08/18/19 2240  ???  sulfamethoxazole-trimethoprim (BACTRIM) 400-80 mg tablet 80 mg of trimethoprim, 1 tablet, Oral, Q MWF, Ejiofor A Lucky Cowboy., MD, 80 mg of trimethoprim at 08/19/19 0942  ???  tacrolimus (PROGRAF) oral suspension, 0.75 mg, Oral, BID, David Stall, MD, 0.75 mg at 08/19/19 1012  ???  valGANciclovir (VALCYTE) tablet 450 mg, 450 mg, Oral, Daily, Almon Register, MD, 450 mg at 08/17/19 1651        Studies: Personally reviewed and interpreted.  Labs/Studies:  Labs and Studies from the last 24hrs per EMR and Reviewed and   All lab results last 24 hours:    Recent Results (from the past 24 hour(s))   Tacrolimus Level, Trough    Collection Time: 08/19/19  9:29 AM   Result Value Ref Range    Tacrolimus, Trough 3.4 (L) 5.0 - 15.0 ng/mL   CBC w/ Differential    Collection Time: 08/19/19  9:29 AM   Result Value Ref Range    WBC 7.4 4.5 - 11.0 10*9/L    RBC 3.74 (L) 4.50 - 5.90 10*12/L    HGB 9.8 (L) 13.5 - 17.5 g/dL    HCT 16.1 (L) 09.6 - 53.0 %    MCV 81.1 80.0 - 100.0 fL    MCH 26.2 26.0 - 34.0 pg    MCHC 32.4 31.0 - 37.0 g/dL    RDW 04.5 (H) 40.9 - 15.0 %    MPV 8.7 7.0 - 10.0 fL    Platelet 114 (L) 150 - 440 10*9/L    Neutrophils % 91.7 %    Lymphocytes % 3.8 %    Monocytes % 3.9 %    Eosinophils % 0.1 %    Basophils % 0.1 %    Neutrophil Left Shift 1+ (A) Not Present    Absolute Neutrophils 6.8 2.0 - 7.5 10*9/L    Absolute Lymphocytes 0.3 (L) 1.5 - 5.0 10*9/L    Absolute Monocytes 0.3 0.2 - 0.8 10*9/L    Absolute Eosinophils 0.0 0.0 - 0.4 10*9/L    Absolute Basophils 0.0 0.0 - 0.1 10*9/L    Large Unstained Cells 0 0 - 4 %    Microcytosis Marked (A) Not Present    Macrocytosis Slight (A) Not Present    Anisocytosis Marked (A) Not Present  Hypochromasia Slight (A) Not Present   Comprehensive Metabolic Panel    Collection Time: 08/19/19  9:30 AM   Result Value Ref Range    Sodium 136 135 - 145 mmol/L    Potassium 5.0 3.5 - 5.0 mmol/L    Chloride 108 (H) 98 - 107 mmol/L    Anion Gap 6 (L) 7 - 15 mmol/L    CO2 22.0 22.0 - 30.0 mmol/L    BUN 27 (H) 7 - 21 mg/dL Creatinine 7.25 3.66 - 1.30 mg/dL    BUN/Creatinine Ratio 28     EGFR CKD-EPI Non-African American, Male >90 >=60 mL/min/1.63m2    EGFR CKD-EPI African American, Male >90 >=60 mL/min/1.57m2    Glucose 344 (H) 70 - 179 mg/dL    Calcium 8.1 (L) 8.5 - 10.2 mg/dL    Albumin 2.3 (L) 3.5 - 5.0 g/dL    Total Protein 6.3 (L) 6.5 - 8.3 g/dL    Total Bilirubin 44.0 (H) 0.0 - 1.2 mg/dL    AST 347 (H) 19 - 55 U/L    ALT 458 (H) <50 U/L    Alkaline Phosphatase 778 (H) 65 - 260 U/L   Gamma GT (GGT)    Collection Time: 08/19/19  9:30 AM   Result Value Ref Range    GGT 561 (H) 12 - 109 U/L   Magnesium Level    Collection Time: 08/19/19  9:30 AM   Result Value Ref Range    Magnesium 1.8 1.6 - 2.2 mg/dL   Phosphorus Level    Collection Time: 08/19/19  9:30 AM   Result Value Ref Range    Phosphorus 3.7 2.9 - 4.7 mg/dL   POCT Glucose    Collection Time: 08/19/19 10:38 AM   Result Value Ref Range    Glucose, POC 295 (H) 70 - 179 mg/dL   POCT Glucose    Collection Time: 08/19/19  3:08 PM   Result Value Ref Range    Glucose, POC 218 (H) 70 - 179 mg/dL     ========================================        Harlow Mares, MD  Med/Peds, PGY-1

## 2019-08-19 NOTE — Unmapped (Signed)
Endocrinology Consult - Follow Up Note    Requesting Attending Physician :  Ciro Backer,*  Service Requesting Consult : Ped Gastroenterology [facility])  Primary Care Provider: [name], MD  Outpatient Endocrinologist: [place]  ??  Assessment/Recommendations:  ??  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 Currently stable on 4 units TID AC with sliding scale. Not on insulin at home.??  ??  Plan  - Continue??4units TIDAC lispro with meals   - Can add PRN dose for overnight meal: if a normal sized meal give 4 units, if smaller give 2 units lispro  - continue sensitive lispro sliding scale with meals and in the evening.   - I would recommend sugar free drinks or water to meet his fluid goal. Encouraged decrease in sugar drinks.   ??  ??  Primary team informed of recommendations. We will continue to follow patient as needed. Please call/page [phone] with questions or concerns.   ??  T. Gillie Manners, MD  [facility] Endocrinology Fellow      Subjective/24 hour events:    Patient was npo yesterday evening and overnight. Refused BG checks due to this. Patient this morning is feeling well and denies any issues. He is mixing ice/water with juice now so he will not be taking in as much sugar.     Objective: :  BP 116/81  - Pulse 82  - Temp 36.5 ??C (Oral)  - Resp 22  - Ht 174.6 cm (5' 8.74)  - Wt 61.2 kg (134 lb 14.7 oz)  - SpO2 100%  - BMI 20.08 kg/m??     Physical Exam:  General:??NAD  Eyes: EOMI. Scleral icterus present  Lungs: No increased work of breathing.   Neuro: no focal deficits noted.  Extremities: ??Full ROM in all extremities.  Skin:WWP??    Test Results    Lab Results   Component Value Date    POCGLU 266 (H) 08/18/2019    POCGLU 222 (H) 08/17/2019    POCGLU 156 08/17/2019    POCGLU 228 (H) 08/17/2019    POCGLU 206 (H) 08/16/2019     Lab Results   Component Value Date    CREATININE 1.13 08/18/2019    NA 137 08/18/2019    K 4.8 08/18/2019    CL 104 08/18/2019    CO2 22.0 08/18/2019    ANIONGAP 11 08/18/2019    CALCIUM 8.0 (L) 08/18/2019    ALBUMIN 2.3 (L) 08/18/2019

## 2019-08-19 NOTE — Unmapped (Signed)
Assumed care from [place], Charity fundraiser. Vital signs stable and patient remained afebrile throughout the shift. Patient medicated with scheduled medications-see mar for times. Afternoon meds held due to patient being NPO and waiting to go to MRCP. Patients PICC site is clean, dry and intact. Hep locked this morning after labs were drawn. Patient voiding and passing flatus. BG checked this morning and insulin given. BG held at this time do to patient being NPO. Patient out of bed and steady on his feet. He worked with PT and states he will continue to work on exercised provided. Patient has been in contact with social worker with things going on at home. Patient states everything seems to be well managed at this time. All needs are met at this time. Call bell within reach. Will continue to monitor.       Problem: Adult Inpatient Plan of Care  Goal: Plan of Care Review  Outcome: Progressing  Goal: Patient-Specific Goal (Individualization)  Outcome: Progressing  Goal: Absence of Hospital-Acquired Illness or Injury  Outcome: Progressing  Goal: Optimal Comfort and Wellbeing  Outcome: Progressing  Goal: Readiness for Transition of Care  Outcome: Progressing  Goal: Rounds/Family Conference  Outcome: Progressing     Problem: Wound  Goal: Optimal Wound Healing  Outcome: Progressing     Problem: Fall Injury Risk  Goal: Absence of Fall and Fall-Related Injury  Outcome: Progressing     Problem: Self-Care Deficit  Goal: Improved Ability to Complete Activities of Daily Living  Outcome: Progressing

## 2019-08-19 NOTE — Unmapped (Signed)
Patient is afebrile and vss.Patient received scheduled thymoglobulin as ordered this shift and tolerated it well.Tolerating regular diet and no emesis noted overnight.Voiding adequately but no BM noted as of this time.Patient was upset last night and refused some care and MD's aware.No c/o pain overnight and no prn pain medication given @ this time.No family noted @ bs overnight.Will continue to monitor.  Problem: Adult Inpatient Plan of Care  Goal: Plan of Care Review  Outcome: Progressing  Goal: Patient-Specific Goal (Individualization)  Outcome: Progressing  Goal: Absence of Hospital-Acquired Illness or Injury  Outcome: Progressing  Goal: Optimal Comfort and Wellbeing  Outcome: Progressing  Goal: Readiness for Transition of Care  Outcome: Progressing  Goal: Rounds/Family Conference  Outcome: Progressing

## 2019-08-20 LAB — COMPREHENSIVE METABOLIC PANEL
ALBUMIN: 2.2 g/dL — ABNORMAL LOW (ref 3.5–5.0)
ALKALINE PHOSPHATASE: 768 U/L — ABNORMAL HIGH (ref 65–260)
ALT (SGPT): 430 U/L — ABNORMAL HIGH (ref <50)
ANION GAP: 9 mmol/L (ref 7–15)
AST (SGOT): 179 U/L — ABNORMAL HIGH (ref 19–55)
BLOOD UREA NITROGEN: 28 mg/dL — ABNORMAL HIGH (ref 7–21)
BUN / CREAT RATIO: 29
CALCIUM: 7.8 mg/dL — ABNORMAL LOW (ref 8.5–10.2)
CHLORIDE: 105 mmol/L (ref 98–107)
CO2: 22 mmol/L (ref 22.0–30.0)
CREATININE: 0.98 mg/dL (ref 0.70–1.30)
EGFR CKD-EPI AA MALE: >90 mL/min/{1.73_m2} (ref >=60)
EGFR CKD-EPI NON-AA MALE: >90 mL/min/{1.73_m2} (ref >=60)
GLUCOSE RANDOM: 323 mg/dL — ABNORMAL HIGH (ref 70–179)
POTASSIUM: 4.7 mmol/L (ref 3.5–5.0)
PROTEIN TOTAL: 5.7 g/dL — ABNORMAL LOW (ref 6.5–8.3)
SODIUM: 136 mmol/L (ref 135–145)

## 2019-08-20 LAB — CBC W/ AUTO DIFF
BASOPHILS RELATIVE PERCENT: 0.1 %
EOSINOPHILS ABSOLUTE COUNT: 0 10*9/L (ref 0.0–0.4)
EOSINOPHILS RELATIVE PERCENT: 0.1 %
HEMATOCRIT: 28.6 % — ABNORMAL LOW (ref 41.0–53.0)
HEMOGLOBIN: 9.4 g/dL — ABNORMAL LOW (ref 13.5–17.5)
LARGE UNSTAINED CELLS: 1 % (ref 0–4)
LYMPHOCYTES ABSOLUTE COUNT: 0.1 10*9/L — ABNORMAL LOW (ref 1.5–5.0)
LYMPHOCYTES RELATIVE PERCENT: 1.6 %
MEAN CORPUSCULAR HEMOGLOBIN CONC: 32.7 g/dL (ref 31.0–37.0)
MEAN CORPUSCULAR HEMOGLOBIN: 26.6 pg (ref 26.0–34.0)
MEAN CORPUSCULAR VOLUME: 81.2 fL (ref 80.0–100.0)
MEAN PLATELET VOLUME: 8.6 fL (ref 7.0–10.0)
MONOCYTES RELATIVE PERCENT: 3.5 %
NEUTROPHILS ABSOLUTE COUNT: 6.9 10*9/L (ref 2.0–7.5)
NEUTROPHILS RELATIVE PERCENT: 94.3 %
PLATELET COUNT: 101 10*9/L — ABNORMAL LOW (ref 150–440)
RED CELL DISTRIBUTION WIDTH: 25.2 % — ABNORMAL HIGH (ref 12.0–15.0)
WBC ADJUSTED: 7.4 10*9/L (ref 4.5–11.0)

## 2019-08-20 LAB — PLATELET COUNT: Platelets:NCnc:Pt:Bld:Qn:Automated count: 101 — ABNORMAL LOW

## 2019-08-20 LAB — TACROLIMUS, TROUGH: Lab: 2.7 — ABNORMAL LOW

## 2019-08-20 LAB — PHOSPHORUS: Phosphate:MCnc:Pt:Ser/Plas:Qn:: 4

## 2019-08-20 LAB — SLIDE REVIEW

## 2019-08-20 LAB — MAGNESIUM: Magnesium:MCnc:Pt:Ser/Plas:Qn:: 1.7

## 2019-08-20 LAB — POIKILOCYTES

## 2019-08-20 LAB — BILIRUBIN DIRECT: Bilirubin.glucuronidated+Bilirubin.albumin bound:MCnc:Pt:Ser/Plas:Qn:: 19.6 — ABNORMAL HIGH

## 2019-08-20 LAB — ALBUMIN: Albumin:MCnc:Pt:Ser/Plas:Qn:: 2.2 — ABNORMAL LOW

## 2019-08-20 LAB — GAMMA GLUTAMYL TRANSFERASE: Gamma glutamyl transferase:CCnc:Pt:Ser/Plas:Qn:: 550 — ABNORMAL HIGH

## 2019-08-20 NOTE — Unmapped (Signed)
Pt tolerating plan of care. VSS. Pt returned from MRI this morning and did not eat until around 1500. Humalog given per order. Pt did have multiple juices today and RN encouraged pt to either drink water or at least water down the juice. Thymoglobulin running and pt tolerating well. Pt able to ambulate to BR and have a bowel movement. No contact from family or friends this shift. WCM.   Problem: Adult Inpatient Plan of Care  Goal: Plan of Care Review  Outcome: Progressing  Goal: Patient-Specific Goal (Individualization)  Outcome: Progressing  Goal: Absence of Hospital-Acquired Illness or Injury  Outcome: Progressing  Goal: Optimal Comfort and Wellbeing  Outcome: Progressing  Goal: Readiness for Transition of Care  Outcome: Progressing  Goal: Rounds/Family Conference  Outcome: Progressing     Problem: Wound  Goal: Optimal Wound Healing  Outcome: Progressing     Problem: Fall Injury Risk  Goal: Absence of Fall and Fall-Related Injury  Outcome: Progressing     Problem: Self-Care Deficit  Goal: Improved Ability to Complete Activities of Daily Living  Outcome: Progressing

## 2019-08-20 NOTE — Unmapped (Signed)
Pediatric Tacrolimus Therapeutic Monitoring Pharmacy Note  ??  [name]??Gross??is a 18 y.o.??male??continuing??tacrolimus.   ??  Indication:??Liver transplant??  ??  Date of Transplant: 09/22/2016??  ??  Current Dosing Information: Tacrolimus??0.75 mg PO BID  ??  Dosing Weight: 55.9 kg  ??  Goals:  Therapeutic Drug Levels  Tacrolimus trough goal: increased today to 8-10 ng/mL  ??  Additional Clinical Monitoring/Outcomes  ?? Monitor renal function (SCr and urine output) and liver function (LFTs)  ?? Monitor for signs/symptoms of adverse events (e.g., hyperglycemia, hyperkalemia, hypomagnesemia, hypertension, headache, tremor)  ??  Results:   Tacrolimus level:??2.7??ng/mL, level drawn slightly late (~12.45 hours post-dose). True 12-hour trough is likely slightly higher, but still below the desired range of 8-10 ng/mL.  ??  Longitudinal Dose Monitoring:  Date Dose (mg)  AM / PM AM Scr (mg/dL) Level  (ng/mL) Key Drug Interactions   08/20/19 0.75 mg / 1.5 mg 0.98 2.7 None   08/19/19 0.75 mg / 0.75 mg 0.95 3.4 None   08/18/19 0.5 mg / 0.75 mg 1.13 2.6 None   08/17/19 0.5 mg / 0.5 mg ??1.12 3.4 None   08/16/19 0.25 mg / 0.5 mg 1.09 3.2 None   08/15/19 0.25 mg/0.5 mg 1.04 4.2 None   08/14/19 0.25 mg/0.5 mg 1.06 4.4 None   08/13/19 0.25 mg/0.25 mg 1.28 5.5 None   08/12/19 --- / 0.25 mg 1.25 5.9 None   08/11/19 0.5 mg /??--- ??1.26 ??10.1 ??None   08/10/19 1 mg / HOLD 1.4 10.5 None   08/09/19 1 mg / 1 mg 1.29 --- None   08/08/19 3 mg / HOLD 1.5 11.9 None   08/06/19 5 mg / 3 mg 0.8 9 None   ??  Pharmacokinetic Considerations and Significant Drug Interactions:  ?? Concurrent hepatotoxic medications: Acetaminophen as pre-med for Thymoglobulin  ?? Concurrent CYP3A4 substrates/inhibitors: None identified  ?? Concurrent nephrotoxic medications: Valganciclovir, SMP/TMX  ??  Assessment/Plan:   Recommendedation(s)  ?? Increase regimen to 1.5 mg (0.02 mg/kg) every 12 hours    ?? Obtain tacrolimus level on Monday to assess dose increase.  ?? Note, patient started on thymoglobulin and high dose steroids 08/11/19.  ??  Follow-up  ?? Next level ordered: 08/22/19 at 1000.  ?? A pharmacist will continue to monitor and recommend levels as appropriate  ??  The above plan was discussed and approved by Blenda Mounts, MD    Please page service pharmacist with questions/clarifications.    Aura Fey, PharmD  Pediatric Clinical Pharmacist  [phone] (pager)  [phone] (pediatric pharmacy)

## 2019-08-20 NOTE — Unmapped (Signed)
Endocrinology Consult - Follow Up Note    Requesting Attending Physician :  Ciro Backer,*  Service Requesting Consult : Ped Gastroenterology [facility])  Primary Care Provider: [name], MD    Assessment/Recommendations:    Glucocorticoids induced hyperglycemia- A1c 4.9% (10/2018)  Currently on methylpredisone 500mg  daily since 10/01.  Blood sugars have been uncontrolled due to variable insulin administration and variable PO intake in setting of several days of high dose steroids.     Plan  - Start 5 units lantus this evening to help cover his drinks and snacking throughout day. It has been hard to time his insulin dosing with his snacking.   -??Continue??4units TIDAC lispro with meals??  - Can add PRN dose for overnight meal: if a normal sized meal give 4 units, if smaller give 2 units lispro  - continue sensitive lispro sliding scale??with meals and in the evening.??  -??I would recommend sugar free drinks or water to meet his fluid goal. Encouraged decrease in sugar drinks.??  ??  Discussed with Dr. Naoma Diener.   ??  Primary team informed of recommendations. We will continue to follow patient as needed. Please call/page [phone] with questions or concerns.   ??  T. Gillie Manners, MD  [facility] Endocrinology Fellow  ??      Subjective/24 hour events:    Blood sugars elevated to 200-300s for past 24 hours. It seems he is not getting insulin with all of his oral intake. He is drinking ensures throughout the day and juice throughout the day.   Patient denies polyura, polydipsia or abd pain.       Objective: :  BP 111/46  - Pulse 78  - Temp 37 ??C (Oral)  - Resp 20  - Ht 174.6 cm (5' 8.74)  - Wt 59.7 kg (131 lb 9.8 oz)  - SpO2 99%  - BMI 19.58 kg/m??     Physical Exam:  General:??NAD  Eyes: EOMI. Scleral icterus present  Lungs: No increased work of breathing.   Neuro: no focal deficits noted.  Extremities: ??Full ROM in all extremities.  Skin:WWP??    Test Results    Lab Results   Component Value Date    POCGLU 220 (H) 08/19/2019 POCGLU 218 (H) 08/19/2019    POCGLU 295 (H) 08/19/2019    POCGLU 266 (H) 08/18/2019    POCGLU 222 (H) 08/17/2019     Lab Results   Component Value Date    CREATININE 0.98 08/20/2019    NA 136 08/20/2019    K 4.7 08/20/2019    CL 105 08/20/2019    CO2 22.0 08/20/2019    ANIONGAP 9 08/20/2019    CALCIUM 7.8 (L) 08/20/2019    ALBUMIN 2.2 (L) 08/20/2019

## 2019-08-20 NOTE — Unmapped (Signed)
Pediatric Palliative Care Consult Note:  ??  Matheau is a 18 y.o. male seen for pediatric palliative care consultation at the request of Dr. Curtis Sites for goals of care, advance care planning and family support  ??  Primary Care Provider: [name], MD  ??  History provided by: patient  ??  Assessment:  ??  Kenden is a 18 y.o. 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 His liver function has not yet responded to high dose steroids and thymoglobulin, which is concerning for possible need for another liver transplant if he could be eligible, which providers have expressed is unlikely for psychosocial reasons. Thoams is seeking autonomy but is at risk for losing his ability to care for himself/make decisions for himself. He has been encouraged to identify a surrogate decision maker (he does not wish for this to default to next of kin) during this time while he is at risk for further decompensation/further decline in health.    ??  Summary of Discussion and Recommendations:   ??  1. SYMPTOMS:   - Nausea: Reported to be improved on current regimen.  Appetite also increased.  Has been able to choose from a variety of foods off menu.  - Insomnia: Scheduled melatonin nightly, currently taking 3 mg and has used higher dose in the past. He would like to try a higher dose - would increase to 5 mg 1 hour before bedtime. Also agrees he would benefit from working more with Psych on managing anxiety which is interfering with sleep - hopes to see Dr. Ike Bene again next week.  Earplugs available at bedside.  - Mood: History of depression and suicide attempt in Apirl 2019, denied SI to other providers during this hospital stay. Also endorses feeling anxious about his condition and about his social situation. Adult psychiatry last saw Saverio 10/6 and plans to f/u per note.   ??  2. GOALS:   - Lillard hopes to make a recovery, find housing, and find a job. He shared that he anticipates discharging to a shelter but notes is friends are encouraging him to move in with family - he believes this is because they are concerned about his safety.   - Hadley has long term goals which include going back to school and becoming a Runner, broadcasting/film/video or nurse.   ??  3. DECISIONS:   - Given risk of hepatic encephalopathy, and general risk of his condition deteriorating, recommended Loel think about who he would want to be his surrogate decision maker if he was unable to make decisions for himself (see ACP below)   - Hafiz likes to receive information directly.   - Dr. Melrose Nakayama reviewed with [place] that redo liver transplant can be an option for some people and inquired whether this is something Estil knows about. Eythan acknowledged knowing that he may not be a candidate and that he doesn't know if he would choose to pursue another transplant. He did not wish to discuss this further today.  Note that he has been told by transplant SW that if he would like to be a candidate for liver transplant, he needs social supports including stable housing, transportation, source of income, et.    - [facility] Decision Making Tool given and explained at a previous visit. He was encouraged to use this tool along with an ACP guide to document information he receives  from providers   ??  4. ADVANCE CARE PLANNING  - Reminded Raygen of the [employer] My Choices booklet and offered to assist him with completing book if wanted/needed. Bret plans to work through [employer] My Choices more over the weekend and was encouraged to take notes about wishes, questions, etc  - Recommended Elridge think about who he would want to be his surrogate decision maker if he was unable to make decisions for himself. He has repeatedly shared that he would not trust his mother to make decisions for him. Husein has started this conversation with friends, [name] and [name], who he agrees would need more information about what that role may look like and not updated on Dmonte's current clinical status   - Code status: full code, not discussed today  - Prognosis: Guarded given severity of liver disease and potential for lack of response to salvage therapies, AKI, and complex social situation with lack of caregiver support  ??  5. SUPPORT:   - Cesario describes limited personal supports including an aunt and maternal grandparents, and friends. He has not spoken to any family since being admitted to the hospital. He has reached out to two friends who have been good supports throughout his illness  - Discussed hospital based resources including chaplains.  - Raymond agreed to begin SSI application as suggested by transplant social worker, as the process could take months to complete.  He has since decided to wait until he was on a secure network instead of the an open/public network at the hospital due to security concerns.  He was also told how to access his medical records.  ??  6. CARE COORDINATION:   - Care coordinated with Dr. Melrose Nakayama (primary GI specialist) and nursing   - Dr. Melrose Nakayama to f/u next week - we would be happy to meet jointly to continue this conversation    ??    Total time spent with patient for evaluation & management (excluding ACP documented separately): 50 minutes.  Greater than 50% of this time spent on counseling/coordination of care: Yes.  See ACP Note from today for additional billable service: No, 0 minutes.     We appreciate the consult. The Children[facility] Supportive Care Team can be reached by pager [name]) or email ([email or web address]).   ??  ??  History of Present Illness:  Danish??is a 18 y.o.??male??with history primary sclerosing cholangitis s/p liver transplant (2017) with multiple??epsiodes??of rejection, depression (history of suicide attempt via over dose in April 2019)??who??was admitted with reports of??black stools, abdominal pain, and icteric sclera??most likely due to acute liver transplant rejection/failure. His hospital course has been complicated by AKI, significant ascites. His liver function has not yet responded to pulse dose steroids, primary team concerned about overall prognosis and need for repeat liver transplant.   ??  Interval events:  Rithy is tried from a long day. Had MRCP which showed multiple abnormalities related to graft injury/liver disease. Met with him along with Dr. Melrose Nakayama to review his understanding of his current condition and what the future may hold. Summary:    - He knows his liver is very sick and that the team is trying to reverse the injury. Thymo was successful in the past and may be again, but may not be  - Ajani doesn't know what went wrong to cause his liver injury and notes that apparently there is a delicate balance between a healthy graft and graft dysfunction  - Dr. Melrose Nakayama reviewed complications of liver disease - encephalopathy, infection,  bleeding primarily - and Mandela has a hard time imagining what encephalopathy would be like. He acknowledged it means he wouldn't be able to think clearly and wouldn't be in control of things. He would not be able to make decisions for himself is he became encephalopatic.   - He has wondered if he could have another transplant but knows this isn't necessarily an option. He did not engage in much conversation about how we would know. If he can't have another transplant, he anticipates giving up   - He would like to designate a surrogate decision maker that is not his mother and can't identify another family member who he would trust to do this. He has two friends in mind who have been a part of his life throughout his illness and transplant course and has brought this up with them. One lives out of state. Both are around his age. He knows that it would be a lot to ask of someone to take on this responsibility and agreed that they would need to talk with his providers along with him to have a better understanding of his illness and the decisions he/they may face, and that there would have to be a way for them to remain in contact so that they're aware of his condition and able to assist if he gets sick    Rani spent some time reviewing the ACP guide we left for him but right now doesn't remember what questions he had. He would like to talk about this next week.    He is having trouble sleeping, noting melatonin helps but only for about an hour. His mind is racing at night, lots of worries. Doesn't share what he's most worried about during these times. He knows he could benefit from learning some strategies for coping with these worries and gettting himself to relax but has struggled to connect with and trust mental health specialists. He appreciated visit with Dr. Ike Bene earlier this week and hopes to work further with her during this admission.         Allergies:  Bee pollen and Pollen extracts  ??  Medications:  Scheduled Meds:  ??? anti-thymocyte globulin (THYMOGLOBIN-rabbit) IVPB CENTRAL LINE FORMULATION  100 mg Intravenous Once   ??? calcium carbonate  300 mg elem calcium Oral BID   ??? calcium carbonate  300 mg elem calcium Oral Once   ??? ergocalciferol  50,000 Units Oral Weekly   ??? flu vacc [id number] 6mos up(PF)  0.5 mL Intramuscular During hospitalization   ??? insulin lispro  0-5 Units Subcutaneous ACHS   ??? insulin lispro  4 Units Subcutaneous TID AC   ??? melatonin  3 mg Oral QPM   ??? multivitamin, with zinc  2 tablet Oral Daily   ??? mupirocin   Topical TID   ??? mycophenolate  540 mg Oral BID   ??? nystatin  1,000,000 Units Oral TID   ??? ondansetron  4 mg Intravenous Once   ??? pantoprazole  40 mg Oral Daily   ??? sulfamethoxazole-trimethoprim  1 tablet Oral Q MWF   ??? Tacrolimus  0.75 mg Oral BID   ??? valGANciclovir  450 mg Oral Daily     Continuous Infusions:  ??? sodium chloride Stopped (08/19/19 0700)   ??? sodium chloride 0.45 % Stopped (08/18/19 2240)     PRN Meds:.dextrose 50 % in water (D50W), Implement **AND** Care order/instruction **AND** Vital signs **AND** Nursing oxygen orders / instructions **AND** sodium chloride **AND** sodium chloride 0.9% **AND** diphenhydrAMINE **  AND** famotidine **AND** methylPREDNISolone sodium succinate (PF) **AND** EPINEPHrine, heparin, porcine (PF), ondansetron, polyethylen glycol    Physical Exam:   Vitals:    08/19/19 2115   BP: 111/66   Pulse: 83   Resp: 19   Temp: 36.8 ??C (98.2 ??F)   SpO2: 100%   ??  Gen: Thin young man sitting up in chair, NAD.  HEENT: +scleral icterus.   Chest: normal work of breathing  Abd: protruberant  Psych: reserved, flat affect, converses fluently   ??  Data: Reviewed test results in Epic.      Waldon Reining, MD, MPH  Attending Physician, Children's Supportive Care Team

## 2019-08-20 NOTE — Unmapped (Signed)
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 Patient has discussed prognosis with Dr. Melrose Nakayama and palliative care (10/9). Patient understands that transplant is and option but that he may not be a candidate.We will continue to discuss his options with him. His active issues are a tenuous fluid balance, balancing intravascular volume and edema. He has ascites and now with pitting edema on exam. We will continue to encouraging exclusive PO intake, and may consider further albumin infusion for hypoalbuminemia in the future. He has steroid-induced hyperglycemia, for which we have consulted adult Endocrine and started meal coverage and sliding scale lispro and will continue to monitor his sugars. His R ear and L posterior neck lesions are improving with Mupirocin. Remaining plan as follows:   ??  # Hx Liver Transplant - Subacute/Acute Liver Failure: ??Pt has biopsy confirmed severe acute rejection (9/25), RAI 9/9. Labs today essentially unchanged w/out robust response to Thymo, however, overall trend of AST and alk phos during this admission has been downward a bit.   - Day 10 of Thymoglobulin with MethylPrednisolone (10/1-)   - monitor labs daily for response (CBC, CMP, mag, phos, GGT)  - Increae tacrolimus to 1.5 mg BID. Goal trough 8-10 ng/mL.    - next level Monday 10/12  - Continue Valgancyclovir 450mg  daily (3months) and Bactrim MWF (39mo) for prophylaxis (10/2-)  - Continue on Nystatin 10ml TID (61mo) prophylaxis (10/2-)  - Mycophenolate 540mg  bid    - PT/INR qod  - Protonix 40 mg PO daily    Edema, ascites, Elevated BUN, Cr above baseline (0.6)  - Monitor fluid status and daily weights  - Encouraging PO intake (>1.6L of fluids PO/day)   - STRICT I/Os        Nausea/emesis  - Zofran??q8 hours PO PRN    Nutrition  - Regular diet  - Glucerna or Ensure supplements   - Ergocalciferol qMon   - aquadex multivitamin    - Daily CMP, Mg, Ph    Hyperglycemia    - Start 5 units Lantus this evening   - Continue 4 units TIDAC lispro w/meals   - PRN dose for overnight meal; if a normal sized meal give 4 units, if smaller meal give 2 units   - continue sensitive lispro sliding scale with meals and in the evenings   - glucose checks qAC (including nighttime meal)    #Lesion on Right Ear and Left posterior Neck, culture with staph aureus   -Dermatology consulted, appreciate recs  -Mupirocin ointment for both lesions until healed, per derm rec    #Constipation  -Continue Miralax 17 g BID PRN     #  Deconditioning  -Patient endorses feeling weak and having difficulty in moving around  -PT following  ??  #Severe Malnutrition:????Prior to admission, patient has been skipping meals due to poor appetite and food insecurity.  - Nutrition consult, appreciate recs  - Thiamine   - Multivitamin, Ca 300 mg BID, vit D 1000u QD   - Monitor phosphorous level  ??  # Major Depressive Disorder, insomnia: Stable. Has hx of attempted overdoses, with the last attempt in April 2019??at which point he was hospitalized in the Adolescent Psychiatry unit and discharged on Lexapro 20 mg. ??He has not engaged with mental health resources or taken medication for depression or insomnia in 8-10 months.????Denies SI, passive suicidality, self-harm, or HI at this time.   - Patient willing to work with psych. States he would like to see Dr. Ike Bene again.  - melatonin 5 mg 1 hour before bedtime      # Homelessness - Social Concerns:??Sylar has experienced homelessness for ~5 months when he left his mother's house. ??He has severed all ties with his family and states his relationship with his mother cannot be reconciled. ??He also requested contact information of family members to be erased from his chart (this has been completed, but social worker did drop contact info into her note on 9/24 in case he changes his mind during admission). ??In addition to food insecurity, he has not been able to secure medications. ??He is routinely exposed to alcohol and drugs (marijuana, cocaine, etc.) second-hand through his friendships though he denies current use himself. ??On urine drug screen, he tested positive for marijuana only.  -Supportive care following. Would like Kaileb to work on naming a Runner, broadcasting/film/video in the event he cannot make decisions for himself. He has identified a few friends as possibilities.   ??  Access:??PIV, PICC (placed 9/28)  ??  Discharge criteria:??liver transplant work-up and treatment of acute rejection    Subjective:     Interval History: On 10/9: LFTs continue to show signs of hepatic damage/rejection. AST downtrending but ALT, Alk phos, GGT, and bilirubin remain elevated and uptrending. Pt met with Dr. Melrose Nakayama and palliative care to discuss current prognosis and possible outcomes.     The patient continued to have elevated blood sugar, endocrine increased insulin yesterday. Apettite remained at the same level and patient denied nausea/vomiting, diarrhea/constipation. Patient had good bowel movement yesterday with well-formed, brown-colored stool.       Objective:     Vital signs in last 24 hours: Temp:  [36.8 ??C-37.3 ??C] 37 ??C  Heart Rate:  [75-88] 78  SpO2 Pulse:  [77-105] 105  Resp:  [19-33] 20  BP: (100-116)/(46-75) 111/46  MAP (mmHg):  [62-87] 65  SpO2:  [97 %-100 %] 99 %  Intake/Output last 3 shifts:  I/O last 3 completed shifts:  In: 1552.6 [P.O.:850; I.V.:81.6; IV Piggyback:621]  Out: 3275 [Urine:3275]    Physical Exam:  General:   Laying in bed, awake, very thin and weak appearing   Head: normocephalic, atraumatic.  Eyes: EOMI. Scleral icterus present.   Nose:   clear, no discharge  Lungs: clear breath sounds bilaterally. No increased work of breathing. No wheezes/crackles.  Heart: regular rate and rhythm. No murmurs appreciated on auscultation.  Abdomen:  Abdomen soft, distended, soft, TTP in RUQ and RLQ. Bowel sounds present.  Neuro: deconditioned, moves all extremities with decreased strength  Extremities:. Pitting edema in the LE, [facility]  Skin:Skin lesion present on right ear and left neck, healing well.  Active Medications reviewed and KEY Medications include:     Current Facility-Administered Medications:   ???  acetaminophen (TYLENOL) tablet 650 mg, 650 mg, Oral, Once, Almon Register, MD  ???  antithymocyte globulin (rabbit) (THYMOGLOBULIN) 100 mg in sodium chloride (NS) 0.9 % 250 mL CENTRAL LINE formulation, 100 mg, Intravenous, Once, Almon Register, MD  ???  calcium carbonate (OS-CAL) tablet 300 mg elem calcium, 300 mg elem calcium, Oral, BID, Anastasia Pall, MD, 300 mg elem calcium at 08/20/19 0940  ???  dextrose 50 % in water (D50W) 50 % solution 12.5 g, 12.5 g, Intravenous, Q10 Min PRN, Ejiofor A Lucky Cowboy., MD  ???  diphenhydrAMINE (BENADRYL) capsule/tablet 50 mg, 50 mg, Oral, Once, Almon Register, MD  ???  Implement, , , Until Discontinued **AND** Care order/instruction, , , PRN **AND** Vital signs, , , PRN **AND** Nursing oxygen orders / instructions, , , PRN **AND** sodium chloride (NS) 0.9 % infusion, 20 mL/hr, Intravenous, Continuous PRN, Stopped at 08/19/19 0700 **AND** sodium chloride 0.9% (NS) bolus 1,000 mL, 1,000 mL, Intravenous, Daily PRN **AND** diphenhydrAMINE (BENADRYL) injection 25 mg, 25 mg, Intravenous, Q4H PRN **AND** famotidine (PEPCID) injection 20 mg, 20 mg, Intravenous, Q4H PRN **AND** methylPREDNISolone sodium succinate (PF) (Solu-MEDROL) injection 125 mg, 125 mg, Intravenous, Q4H PRN **AND** EPINEPHrine (EPIPEN) injection 0.3 mg, 0.3 mg, Intramuscular, Daily PRN, Almon Register, MD  ???  ergocalciferol (DRISDOL) capsule 50,000 Units, 50,000 Units, Oral, Weekly, Ejiofor A Lucky Cowboy., MD, 50,000 Units at 08/15/19 [id number]  ???  heparin preservative-free injection 10 units/mL syringe (HEPARIN LOCK FLUSH), 20 Units, Intravenous, Daily PRN, Anastasia Pall, MD, 20 Units at 08/19/19 0653  ???  influenza vaccine quad (FLUARIX, FLULAVAL, FLUZONE) (6 MOS & UP) 2020-21, 0.5 mL, Intramuscular, During hospitalization, Anastasia Pall, MD  ???  insulin lispro (HumaLOG) injection 0-5 Units, 0-5 Units, Subcutaneous, ACHS, David Stall, MD, 2 Units at 08/19/19 2109  ???  insulin lispro (HumaLOG) injection 2-4 Units, 2-4 Units, Subcutaneous, Nightly PRN, David Stall, MD  ???  insulin lispro (HumaLOG) injection 4 Units, 4 Units, Subcutaneous, TID AC, Dannielle Huh, MD, Stopped at 08/19/19 1548  ???  melatonin tablet 3 mg, 3 mg, Oral, QPM, David Stall, MD, 3 mg at 08/19/19 2108  ???  methylPREDNISolone sodium succinate (PF) (Solu-MEDROL) injection 500 mg, 500 mg, Intravenous, Once, Almon Register, MD  ???  multivitamin, with zinc (AQUADEKS) chewable tablet, 2 tablet, Oral, Daily, Ejiofor A Lucky Cowboy., MD, 2 tablet at 08/20/19 0940  ???  mupirocin (BACTROBAN) 2 % ointment, , Topical, TID, David Stall, MD  ???  mycophenolate (MYFORTIC) EC tablet 540 mg, 540 mg, Oral, BID, Anastasia Pall, MD, 540 mg at 08/20/19 0940  ???  nystatin (MYCOSTATIN) oral suspension, 1,000,000 Units, Oral, TID, Anastasia Pall, MD, 1,000,000 Units at 08/20/19 0940  ???  ondansetron (ZOFRAN) injection 4 mg, 4 mg, Intravenous, Once, Ejiofor A Lucky Cowboy., MD  ???  ondansetron (ZOFRAN-ODT) disintegrating tablet 4 mg, 4 mg, Oral, Q8H PRN, David Stall, MD  ???  pantoprazole (PROTONIX) EC tablet 40 mg, 40 mg, Oral, Daily, Almon Register, MD, 40 mg at 08/19/19 2108  ???  polyethylene glycol (MIRALAX) packet 17 g, 17 g, Oral, BID PRN, David Stall, MD  ???  sodium chloride 0.45% (1/2 NS) infusion, 10 mL/hr, Intravenous, Continuous, Val Eagle, Last Rate: 10 mL/hr at 08/19/19 2348, 10 mL/hr at 08/19/19 2348  ???  sulfamethoxazole-trimethoprim (BACTRIM) 400-80 mg tablet 80  mg of trimethoprim, 1 tablet, Oral, Q MWF, Ejiofor A Lucky Cowboy., MD, 80 mg of trimethoprim at 08/19/19 0942  ???  tacrolimus (PROGRAF) oral suspension, 0.75 mg, Oral, BID, David Stall, MD, 0.75 mg at 08/20/19 0941  ???  valGANciclovir (VALCYTE) tablet 450 mg, 450 mg, Oral, Daily, Almon Register, MD, 450 mg at 08/19/19 1738        Studies: Personally reviewed and interpreted.  Labs/Studies:  Labs and Studies from the last 24hrs per EMR and Reviewed and   All lab results last 24 hours:    Recent Results (from the past 24 hour(s))   POCT Glucose    Collection Time: 08/19/19  3:08 PM   Result Value Ref Range    Glucose, POC 218 (H) 70 - 179 mg/dL   POCT Glucose    Collection Time: 08/19/19  8:57 PM   Result Value Ref Range    Glucose, POC 220 (H) 70 - 179 mg/dL   Comprehensive Metabolic Panel    Collection Time: 08/20/19  5:42 AM   Result Value Ref Range    Sodium 136 135 - 145 mmol/L    Potassium 4.7 3.5 - 5.0 mmol/L    Chloride 105 98 - 107 mmol/L    Anion Gap 9 7 - 15 mmol/L    CO2 22.0 22.0 - 30.0 mmol/L    BUN 28 (H) 7 - 21 mg/dL    Creatinine 1.61 0.96 - 1.30 mg/dL    BUN/Creatinine Ratio 29     EGFR CKD-EPI Non-African American, Male >90 >=60 mL/min/1.24m2    EGFR CKD-EPI African American, Male >90 >=60 mL/min/1.70m2    Glucose 323 (H) 70 - 179 mg/dL    Calcium 7.8 (L) 8.5 - 10.2 mg/dL    Albumin 2.2 (L) 3.5 - 5.0 g/dL    Total Protein 5.7 (L) 6.5 - 8.3 g/dL    Total Bilirubin 04.5 (H) 0.0 - 1.2 mg/dL    AST 409 (H) 19 - 55 U/L    ALT 430 (H) <50 U/L    Alkaline Phosphatase 768 (H) 65 - 260 U/L   Gamma GT (GGT)    Collection Time: 08/20/19  5:42 AM   Result Value Ref Range    GGT 550 (H) 12 - 109 U/L   Magnesium Level    Collection Time: 08/20/19  5:42 AM   Result Value Ref Range    Magnesium 1.7 1.6 - 2.2 mg/dL   Phosphorus Level    Collection Time: 08/20/19  5:42 AM   Result Value Ref Range    Phosphorus 4.0 2.9 - 4.7 mg/dL   Bilirubin, Direct    Collection Time: 08/20/19  5:42 AM   Result Value Ref Range    Bilirubin, Direct 19.60 (H) 0.00 - 0.40 mg/dL   CBC w/ Differential    Collection Time: 08/20/19  5:42 AM   Result Value Ref Range    WBC 7.4 4.5 - 11.0 10*9/L    RBC 3.53 (L) 4.50 - 5.90 10*12/L    HGB 9.4 (L) 13.5 - 17.5 g/dL    HCT 81.1 (L) 91.4 - 53.0 %    MCV 81.2 80.0 - 100.0 fL    MCH 26.6 26.0 - 34.0 pg    MCHC 32.7 31.0 - 37.0 g/dL    RDW 78.2 (H) 95.6 - 15.0 %    MPV 8.6 7.0 - 10.0 fL    Platelet 101 (L) 150 - 440 10*9/L    Neutrophils % 94.3 %  Lymphocytes % 1.6 %    Monocytes % 3.5 %    Eosinophils % 0.1 %    Basophils % 0.1 %    Neutrophil Left Shift 1+ (A) Not Present    Absolute Neutrophils 6.9 2.0 - 7.5 10*9/L    Absolute Lymphocytes 0.1 (L) 1.5 - 5.0 10*9/L    Absolute Monocytes 0.3 0.2 - 0.8 10*9/L    Absolute Eosinophils 0.0 0.0 - 0.4 10*9/L    Absolute Basophils 0.0 0.0 - 0.1 10*9/L    Large Unstained Cells 1 0 - 4 %    Microcytosis Marked (A) Not Present    Macrocytosis Slight (A) Not Present    Anisocytosis Marked (A) Not Present    Hypochromasia Slight (A) Not Present   Morphology Review    Collection Time: 08/20/19  5:42 AM   Result Value Ref Range    Smear Review Comments See Comment (A) Undefined    Target Cells Moderate (A) Not Present    Poikilocytosis Moderate (A) Not Present   Tacrolimus Level, Trough    Collection Time: 08/20/19  9:38 AM   Result Value Ref Range    Tacrolimus, Trough 2.7 (L) 5.0 - 15.0 ng/mL     ========================================        Emmie Niemann, MS3      I attest that I have reviewed the student note and that the components of the history of the present illness, the physical exam, and the assessment and plan documented were performed by me or were performed in my presence by the student where I verified the documentation and performed (or re-performed) the exam and medical decision making.       Gildardo Griffes, MD  Pediatrics, PGY-3  Pager: [phone]

## 2019-08-20 NOTE — Unmapped (Addendum)
[name] Gross' VSS, afebrile. PICC c/d/I with fluids infusing, labs drawn per order. Glucose 323 on AML. PER MD, no need to recheck with Accu check or cover with sliding scale insulin. ATG finished around 2300, pt tolerated well. Eating and drinking. Voiding well, no BM this shift. No complaints of pain or nausea this shift. Will continue to monitor.         Problem: Adult Inpatient Plan of Care  Goal: Plan of Care Review  Outcome: Progressing  Goal: Patient-Specific Goal (Individualization)  Outcome: Progressing  Goal: Absence of Hospital-Acquired Illness or Injury  Outcome: Progressing  Goal: Optimal Comfort and Wellbeing  Outcome: Progressing  Goal: Readiness for Transition of Care  Outcome: Progressing  Goal: Rounds/Family Conference  Outcome: Progressing     Problem: Wound  Goal: Optimal Wound Healing  Outcome: Progressing     Problem: Fall Injury Risk  Goal: Absence of Fall and Fall-Related Injury  Outcome: Progressing     Problem: Self-Care Deficit  Goal: Improved Ability to Complete Activities of Daily Living  Outcome: Progressing

## 2019-08-21 LAB — CBC W/ AUTO DIFF
BASOPHILS RELATIVE PERCENT: 0.1 %
EOSINOPHILS ABSOLUTE COUNT: 0 10*9/L (ref 0.0–0.4)
EOSINOPHILS RELATIVE PERCENT: 0.1 %
HEMATOCRIT: 30.2 % — ABNORMAL LOW (ref 41.0–53.0)
HEMOGLOBIN: 10.1 g/dL — ABNORMAL LOW (ref 13.5–17.5)
LARGE UNSTAINED CELLS: 0 % (ref 0–4)
LYMPHOCYTES ABSOLUTE COUNT: 0.8 10*9/L — ABNORMAL LOW (ref 1.5–5.0)
LYMPHOCYTES RELATIVE PERCENT: 10.3 %
MEAN CORPUSCULAR HEMOGLOBIN CONC: 33.3 g/dL (ref 31.0–37.0)
MEAN CORPUSCULAR HEMOGLOBIN: 26.8 pg (ref 26.0–34.0)
MEAN CORPUSCULAR VOLUME: 80.6 fL (ref 80.0–100.0)
MEAN PLATELET VOLUME: 8.9 fL (ref 7.0–10.0)
MONOCYTES ABSOLUTE COUNT: 0.2 10*9/L (ref 0.2–0.8)
MONOCYTES RELATIVE PERCENT: 2.6 %
NEUTROPHILS ABSOLUTE COUNT: 6.6 10*9/L (ref 2.0–7.5)
NEUTROPHILS RELATIVE PERCENT: 86.7 %
PLATELET COUNT: 116 10*9/L — ABNORMAL LOW (ref 150–440)
RED BLOOD CELL COUNT: 3.75 10*12/L — ABNORMAL LOW (ref 4.50–5.90)
WBC ADJUSTED: 7.6 10*9/L (ref 4.5–11.0)

## 2019-08-21 LAB — COMPREHENSIVE METABOLIC PANEL
ALBUMIN: 2.4 g/dL — ABNORMAL LOW (ref 3.5–5.0)
ALKALINE PHOSPHATASE: 824 U/L — ABNORMAL HIGH (ref 65–260)
ALT (SGPT): 461 U/L — ABNORMAL HIGH (ref <50)
ANION GAP: 9 mmol/L (ref 7–15)
AST (SGOT): 189 U/L — ABNORMAL HIGH (ref 19–55)
BILIRUBIN TOTAL: 24.4 mg/dL — ABNORMAL HIGH (ref 0.0–1.2)
BLOOD UREA NITROGEN: 30 mg/dL — ABNORMAL HIGH (ref 7–21)
BUN / CREAT RATIO: 32
CALCIUM: 8.1 mg/dL — ABNORMAL LOW (ref 8.5–10.2)
CHLORIDE: 108 mmol/L — ABNORMAL HIGH (ref 98–107)
CO2: 23 mmol/L (ref 22.0–30.0)
CREATININE: 0.94 mg/dL (ref 0.70–1.30)
EGFR CKD-EPI AA MALE: >90 mL/min/{1.73_m2} (ref >=60)
EGFR CKD-EPI NON-AA MALE: >90 mL/min/{1.73_m2} (ref >=60)
POTASSIUM: 4.6 mmol/L (ref 3.5–5.0)
PROTEIN TOTAL: 6.1 g/dL — ABNORMAL LOW (ref 6.5–8.3)
SODIUM: 140 mmol/L (ref 135–145)

## 2019-08-21 LAB — CALCIUM: Calcium:MCnc:Pt:Ser/Plas:Qn:: 8.1 — ABNORMAL LOW

## 2019-08-21 LAB — PHOSPHORUS
PHOSPHORUS: 4 mg/dL (ref 2.9–4.7)
Phosphate:MCnc:Pt:Ser/Plas:Qn:: 4

## 2019-08-21 LAB — MAGNESIUM: Magnesium:MCnc:Pt:Ser/Plas:Qn:: 1.7

## 2019-08-21 LAB — RED CELL DISTRIBUTION WIDTH: Lab: 25.8 — ABNORMAL HIGH

## 2019-08-21 LAB — GAMMA GLUTAMYL TRANSFERASE: Gamma glutamyl transferase:CCnc:Pt:Ser/Plas:Qn:: 578 — ABNORMAL HIGH

## 2019-08-21 LAB — BILIRUBIN DIRECT: Bilirubin.glucuronidated+Bilirubin.albumin bound:MCnc:Pt:Ser/Plas:Qn:: 19.6 — ABNORMAL HIGH

## 2019-08-21 NOTE — Unmapped (Signed)
Endocrinology Consult - Follow Up Note    Requesting Attending Physician :  Ciro Backer,*  Service Requesting Consult : [facility])  Primary Care Provider: [name], MD    Assessment/Recommendations:    ??  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  Blood sugars have been uncontrolled due to variable insulin administration and variable PO intake in setting of several days of high dose steroids.   ??  Plan  - Continu 5 U lantus nightly   -??Continue??4units TIDAC lispro with meals??  -??Can add??PRN dose for overnight meal: if a normal sized meal give 4 units, if smaller give 2 units lispro  - continue sensitive lispro sliding scale??with meals and in the evening.??  -??I would recommend sugar free drinks or water to meet his fluid goal. Encouraged decrease in sugar drinks.??  ??  Discussed with Dr. Naoma Diener.   ??  Primary team informed of recommendations. We will continue to follow patient as needed. Please call/page [phone] with questions or concerns.   ??  T. Gillie Manners, MD  [facility] Endocrinology Fellow  ??    Subjective/24 hour events:    No acute events. Received lantus last night. No extra insulin given for presumed snacks. Still variable eating. But BG stable in low 200s.       Objective: :  BP 110/72  - Pulse 87  - Temp 36.8 ??C (Oral)  - Resp 20  - Ht 174.6 cm (5' 8.74)  - Wt 60.1 kg (132 lb 7.9 oz)  - SpO2 100%  - BMI 19.71 kg/m??     Physical Exam:  General:??NAD  Eyes: EOMI. Scleral icterus present  Lungs: No increased work of breathing.   Neuro: no focal deficits noted.  Skin:WWP??    Test Results    Lab Results   Component Value Date    POCGLU 232 (H) 08/21/2019    POCGLU 277 (H) 08/20/2019    POCGLU 232 (H) 08/20/2019    POCGLU 220 (H) 08/19/2019    POCGLU 218 (H) 08/19/2019     Lab Results   Component Value Date    CREATININE 0.94 08/21/2019    NA 140 08/21/2019    K 4.6 08/21/2019    CL 108 (H) 08/21/2019    CO2 23.0 08/21/2019    ANIONGAP 9 08/21/2019    CALCIUM 8.1 (L) 08/21/2019    ALBUMIN 2.4 (L) 08/21/2019

## 2019-08-21 NOTE — Unmapped (Addendum)
VSS, afebrile. PICC c/d/I with fluids infusing. Had low appetitie this shift, glucose was 323 at shift change from the AML. Per MD, no need to recheck with accu or cover with sliding scale, was told to give insulin when he ate today. Pt did not eat till around 1700, was given insulin. Pt was voiding well, and did not complain of pain, complained of nausea after eating, refused zofran and has since resolved. Pt was given all medications per [facility] and tolerated them well. Pre med for the thymoglobulin tolerated that well. No one is at the bedside, will continue to monitor.       Problem: Adult Inpatient Plan of Care  Goal: Plan of Care Review  Outcome: Ongoing - Unchanged  Goal: Patient-Specific Goal (Individualization)  Outcome: Ongoing - Unchanged  Goal: Absence of Hospital-Acquired Illness or Injury  Outcome: Ongoing - Unchanged  Goal: Optimal Comfort and Wellbeing  Outcome: Ongoing - Unchanged  Goal: Readiness for Transition of Care  Outcome: Ongoing - Unchanged  Goal: Rounds/Family Conference  Outcome: Ongoing - Unchanged     Problem: Wound  Goal: Optimal Wound Healing  Outcome: Ongoing - Unchanged     Problem: Fall Injury Risk  Goal: Absence of Fall and Fall-Related Injury  Outcome: Ongoing - Unchanged     Problem: Self-Care Deficit  Goal: Improved Ability to Complete Activities of Daily Living  Outcome: Ongoing - Unchanged

## 2019-08-21 NOTE — Unmapped (Signed)
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 Patient has discussed prognosis with Dr. Melrose Nakayama and palliative care (10/9). Patient understands that transplant is and option but that he may not be a candidate.We will continue to discuss his options with him. His active issues are a tenuous fluid balance, balancing intravascular volume and edema. He has ascites and now with pitting edema on exam. We will continue to encouraging exclusive PO intake, and may consider further albumin infusion for hypoalbuminemia in the future. He has steroid-induced hyperglycemia, for which we have consulted adult Endocrine and started meal coverage and sliding scale lispro and will continue to monitor his sugars. His R ear and L posterior neck lesions are improving with Mupirocin. Remaining plan as follows:   ??  # Hx Liver Transplant - Subacute/Acute Liver Failure: ??Pt has biopsy confirmed severe acute rejection (9/25), RAI 9/9. Labs today essentially unchanged w/out robust response to Thymo, however, overall trend of AST and alk phos during this admission has been downward a bit. Per tranplant team recs, will consider repeating liver biopsy to confirm that his livery disease is still consistent with rejection in the setting of poor response to immunosuppression  - Day 11 of Thymoglobulin with MethylPrednisolone (10/1-)   - monitor labs daily for response (CBC, CMP, mag, phos, GGT), overall poor response thus far  - Increae tacrolimus to 1.5 mg BID 10/10. Goal trough 10-12 ng/mL, per transplant team recs   - next level Monday 10/12  - Continue Valgancyclovir 450mg  daily (3months) and Bactrim MWF (622mo) for prophylaxis (10/2-)  - Continue on Nystatin 10ml TID (22mo) prophylaxis (10/2-)  - Mycophenolate 540mg  bid    - PT/INR qod  - Protonix 40 mg PO daily  - Repeat CD3 today    Edema, ascites, Elevated BUN, Cr above baseline (0.6)  - Monitor fluid status and daily weights  - Encouraging PO intake (>1.6L of fluids PO/day)   - STRICT I/Os      Nausea/emesis  - Zofran??q8 hours PO PRN    Nutrition  - Regular diet  - Glucerna or Ensure supplements   - Ergocalciferol qMon   - aquadex multivitamin    - Daily CMP, Mg, Ph    Hyperglycemia    - Start 5 units Lantus 10/10   - Continue 4 units TIDAC lispro w/meals   - PRN dose for overnight meal; if a normal sized meal give 4 units, if smaller meal give 2 units   - continue sensitive lispro sliding scale with meals and in the evenings   - glucose checks qAC (including nighttime meal)    #Lesion on Right  Ear and Left posterior Neck, culture +staph aureus   -Dermatology consulted, appreciate recs  -Mupirocin ointment for both lesions until healed, per derm rec    #Constipation  -Continue Miralax 17 g BID PRN     #Deconditioning  Patient endorses feeling weak and having difficulty in moving around  -PT following  -Encourage OOB and ambulation  ??  #Severe Malnutrition:????Prior to admission, patient has been skipping meals due to poor appetite and food insecurity. Albumin persistently low in setting of liver disease.  - Nutrition consult, appreciate recs  - Thiamine   - Multivitamin, Ca 300 mg BID, vit D 1000u QD   - Monitor phosphorous level  ??  # Major Depressive Disorder, insomnia: Stable. Has hx of attempted overdoses, with the last attempt in April 2019??at which point he was hospitalized in the Adolescent Psychiatry unit and discharged on Lexapro 20 mg. ??He has not engaged with mental health resources or taken medication for depression or insomnia in 8-10 months.????Denies SI, passive suicidality, self-harm, or HI at this time.   - Patient willing to work with psych. States he would like to see Dr. Ike Bene again.  - melatonin 5 mg 1 hour before bedtime    # Homelessness - Social Concerns:??Dorsie has experienced homelessness for ~5 months when he left his mother's house. ??He has severed all ties with his family and states his relationship with his mother cannot be reconciled. ??He also requested contact information of family members to be erased from his chart (this has been completed, but social worker did drop contact info into her note on 9/24 in case he changes his mind during admission). ??In addition to food insecurity, he has not been able to secure medications. ??He is routinely exposed to alcohol and drugs (marijuana, cocaine, etc.) second-hand through his friendships though he denies current use himself. ??On urine drug screen, he tested positive for marijuana only.  -Supportive care following. Would like Kayleb to work on naming a Runner, broadcasting/film/video in the event he cannot make decisions for himself. He has identified a few friends as possibilities.   ??  Access:??PIV, PICC (placed 9/28)  ??  Discharge criteria:??liver transplant work-up and treatment of acute rejection    Subjective:     Interval History: Lantus 5U started overnight, blood sugars continue to be >200. Reported visual hallucinations overnight but was alert and oriented, stated he was still sleepy when these occurred. Continues to be alerta dn oriented this morning. Refused breakfast this morning after receiving insulin because he did not like what he ordered.    Recommendations made from transplant team via Dr. Gean Quint include repeat CD3, increase Tacro trough goal possible repeat liver biopsy.      Objective:     Vital signs in last 24 hours:  Temp:  [36.8 ??C-37.2 ??C] 36.8 ??C  Heart Rate:  [69-91] 87  SpO2 Pulse:  [69-89] 69  Resp:  [13-31] 20  BP: (101-123)/(60-81) 110/72  MAP (mmHg):  [72-93] 83  SpO2:  [99 %-100 %] 100 %  Intake/Output last 3 shifts:  I/O last 3 completed shifts:  In: 3189.8 [P.O.:2361; I.V.:517.8; IV Piggyback:311]  Out: 3600 [Urine:3600]    Physical Exam:  General:   Laying in bed, awake, very thin and weak appearing   Head: normocephalic, atraumatic.  Eyes: EOMI. Scleral icterus present.   Nose:   clear, no discharge  Lungs: clear breath sounds bilaterally. No increased work of breathing. No wheezes/crackles.  Heart: regular rate and rhythm. No murmurs appreciated on  auscultation.  Abdomen:  Abdomen soft, distended, soft, TTP in RUQ and RLQ. Ascites present  Neuro: deconditioned, moves all extremities with decreased strength  Extremities:. Pitting edema in the LE, [facility]  Skin:Skin lesion present on right ear and left neck, healing well.    Active Medications reviewed and KEY Medications include:     Current Facility-Administered Medications:   ???  acetaminophen (TYLENOL) tablet 650 mg, 650 mg, Oral, Once, Almon Register, MD  ???  antithymocyte globulin (rabbit) (THYMOGLOBULIN) 100 mg in sodium chloride (NS) 0.9 % 250 mL CENTRAL LINE formulation, 100 mg, Intravenous, Once, Almon Register, MD  ???  calcium carbonate (OS-CAL) tablet 300 mg elem calcium, 300 mg elem calcium, Oral, BID, Anastasia Pall, MD, 300 mg elem calcium at 08/21/19 0917  ???  dextrose 50 % in water (D50W) 50 % solution 12.5 g, 12.5 g, Intravenous, Q10 Min PRN, Ejiofor A Lucky Cowboy., MD  ???  diphenhydrAMINE (BENADRYL) capsule/tablet 50 mg, 50 mg, Oral, Once, Almon Register, MD  ???  Implement, , , Until Discontinued **AND** Care order/instruction, , , PRN **AND** Vital signs, , , PRN **AND** Nursing oxygen orders / instructions, , , PRN **AND** sodium chloride (NS) 0.9 % infusion, 20 mL/hr, Intravenous, Continuous PRN, Stopped at 08/19/19 0700 **AND** sodium chloride 0.9% (NS) bolus 1,000 mL, 1,000 mL, Intravenous, Daily PRN **AND** diphenhydrAMINE (BENADRYL) injection 25 mg, 25 mg, Intravenous, Q4H PRN **AND** famotidine (PEPCID) injection 20 mg, 20 mg, Intravenous, Q4H PRN **AND** methylPREDNISolone sodium succinate (PF) (Solu-MEDROL) injection 125 mg, 125 mg, Intravenous, Q4H PRN **AND** EPINEPHrine (EPIPEN) injection 0.3 mg, 0.3 mg, Intramuscular, Daily PRN, Almon Register, MD  ???  ergocalciferol (DRISDOL) capsule 50,000 Units, 50,000 Units, Oral, Weekly, Ejiofor A Lucky Cowboy., MD, 50,000 Units at 08/15/19 [phone]  ???  heparin preservative-free injection 10 units/mL syringe (HEPARIN LOCK FLUSH), 20 Units, Intravenous, Daily PRN, Anastasia Pall, MD, 20 Units at 08/21/19 0507  ???  influenza vaccine quad (FLUARIX, FLULAVAL, FLUZONE) (6 MOS & UP) 2020-21, 0.5 mL, Intramuscular, During hospitalization, Anastasia Pall, MD  ???  insulin glargine (LANTUS) injection 5 Units, 5 Units, Subcutaneous, Nightly, David Stall, MD, 5 Units at 08/20/19 2132  ???  insulin lispro (HumaLOG) injection 0-5 Units, 0-5 Units, Subcutaneous, ACHS, David Stall, MD, Stopped at 08/21/19 1130  ???  insulin lispro (HumaLOG) injection 2-4 Units, 2-4 Units, Subcutaneous, Nightly PRN, David Stall, MD  ???  insulin lispro (HumaLOG) injection 4 Units, 4 Units, Subcutaneous, TID AC, Dannielle Huh, MD, Stopped at 08/21/19 1130  ???  melatonin tablet 3 mg, 3 mg, Oral, QPM, David Stall, MD, 3 mg at 08/20/19 2113  ???  methylPREDNISolone sodium succinate (PF) (Solu-MEDROL) injection 500 mg, 500 mg, Intravenous, Once, Almon Register, MD  ???  multivitamin, with zinc (AQUADEKS) chewable tablet, 2 tablet, Oral, Daily, Ejiofor A Lucky Cowboy., MD, 2 tablet at 08/21/19 9604  ???  mupirocin (BACTROBAN) 2 % ointment, , Topical, TID, David Stall, MD, 1 application at 08/21/19 1337  ???  mycophenolate (MYFORTIC) EC tablet 540 mg, 540 mg, Oral, BID, Anastasia Pall, MD, 540 mg at 08/21/19 5409  ???  nystatin (MYCOSTATIN) oral suspension, 1,000,000 Units, Oral, TID, Anastasia Pall, MD, 1,000,000 Units at 08/21/19 1336  ???  ondansetron (ZOFRAN) injection 4 mg, 4 mg, Intravenous, Once, Ejiofor A Lucky Cowboy., MD  ???  ondansetron (ZOFRAN-ODT) disintegrating tablet 4 mg, 4 mg, Oral, Q8H PRN, David Stall, MD  ???  pantoprazole (PROTONIX) EC tablet 40 mg, 40 mg, Oral, Daily, Almon Register, MD, 40 mg at 08/20/19 2113  ???  polyethylene glycol (MIRALAX) packet 17 g, 17 g, Oral, BID PRN, David Stall, MD  ???  sodium chloride 0.45% (1/2 NS) infusion, 10 mL/hr, Intravenous, Continuous, Val Eagle, Last Rate: 10 mL/hr at 08/19/19 2348, 10 mL/hr at 08/19/19 2348  ???  sulfamethoxazole-trimethoprim (BACTRIM) 400-80 mg tablet 80 mg of trimethoprim, 1 tablet, Oral, Q MWF, Ejiofor A Lucky Cowboy., MD, 80 mg of trimethoprim at 08/19/19 0942  ???  tacrolimus (PROGRAF) 1.5mg  combo product, 1.5 mg, Oral, BID, Almon Register, MD, 1.5 mg at 08/21/19 0910  ???  valGANciclovir (VALCYTE) tablet 450 mg, 450 mg, Oral, Daily, Almon Register, MD, 450 mg at 08/20/19 1658        Studies: Personally reviewed and interpreted.  Labs/Studies:  Labs and Studies from the last 24hrs per EMR and Reviewed and   All lab results last 24 hours:    Recent Results (from the past 24 hour(s))   POCT Glucose    Collection Time: 08/20/19  3:36 PM   Result Value Ref Range    Glucose, POC 232 (H) 70 - 179 mg/dL   POCT Glucose    Collection Time: 08/20/19  9:24 PM   Result Value Ref Range    Glucose, POC 277 (H) 70 - 179 mg/dL   Comprehensive Metabolic Panel    Collection Time: 08/21/19  5:13 AM   Result Value Ref Range    Sodium 140 135 - 145 mmol/L Potassium 4.6 3.5 - 5.0 mmol/L    Chloride 108 (H) 98 - 107 mmol/L    Anion Gap 9 7 - 15 mmol/L    CO2 23.0 22.0 - 30.0 mmol/L    BUN 30 (H) 7 - 21 mg/dL    Creatinine 8.46 9.62 - 1.30 mg/dL    BUN/Creatinine Ratio 32     EGFR CKD-EPI Non-African American, Male >90 >=60 mL/min/1.75m2    EGFR CKD-EPI African American, Male >90 >=60 mL/min/1.67m2    Glucose 244 (H) 70 - 179 mg/dL    Calcium 8.1 (L) 8.5 - 10.2 mg/dL    Albumin 2.4 (L) 3.5 - 5.0 g/dL    Total Protein 6.1 (L) 6.5 - 8.3 g/dL    Total Bilirubin 95.2 (H) 0.0 - 1.2 mg/dL    AST 841 (H) 19 - 55 U/L    ALT 461 (H) <50 U/L    Alkaline Phosphatase 824 (H) 65 - 260 U/L   Gamma GT (GGT)    Collection Time: 08/21/19  5:13 AM   Result Value Ref Range    GGT 578 (H) 12 - 109 U/L   Magnesium Level    Collection Time: 08/21/19  5:13 AM   Result Value Ref Range    Magnesium 1.7 1.6 - 2.2 mg/dL   Phosphorus Level    Collection Time: 08/21/19  5:13 AM   Result Value Ref Range    Phosphorus 4.0 2.9 - 4.7 mg/dL   CBC w/ Differential    Collection Time: 08/21/19  5:13 AM   Result Value Ref Range    WBC 7.6 4.5 - 11.0 10*9/L    RBC 3.75 (L) 4.50 - 5.90 10*12/L    HGB 10.1 (L) 13.5 - 17.5 g/dL    HCT 32.4 (L) 40.1 - 53.0 %    MCV 80.6 80.0 - 100.0 fL    MCH 26.8 26.0 - 34.0 pg    MCHC  33.3 31.0 - 37.0 g/dL    RDW 45.4 (H) 09.8 - 15.0 %    MPV 8.9 7.0 - 10.0 fL    Platelet 116 (L) 150 - 440 10*9/L    Neutrophils % 86.7 %    Lymphocytes % 10.3 %    Monocytes % 2.6 %    Eosinophils % 0.1 %    Basophils % 0.1 %    Neutrophil Left Shift 1+ (A) Not Present    Absolute Neutrophils 6.6 2.0 - 7.5 10*9/L    Absolute Lymphocytes 0.8 (L) 1.5 - 5.0 10*9/L    Absolute Monocytes 0.2 0.2 - 0.8 10*9/L    Absolute Eosinophils 0.0 0.0 - 0.4 10*9/L    Absolute Basophils 0.0 0.0 - 0.1 10*9/L    Large Unstained Cells 0 0 - 4 %    Microcytosis Marked (A) Not Present    Macrocytosis Slight (A) Not Present    Anisocytosis Marked (A) Not Present    Hypochromasia Slight (A) Not Present   Bilirubin, Direct    Collection Time: 08/21/19  5:13 AM   Result Value Ref Range    Bilirubin, Direct 19.60 (H) 0.00 - 0.40 mg/dL   POCT Glucose    Collection Time: 08/21/19  7:41 AM   Result Value Ref Range    Glucose, POC 232 (H) 70 - 179 mg/dL     ========================================      Harlow Mares, MD  Med/Peds, PGY-1

## 2019-08-21 NOTE — Unmapped (Signed)
VSS on room air. Afebrile. Tolerated completion of ATG infusion. PICC cdi, heparin locked. No c/o pain or nausea. Evening BG 277, pt not eating evening snack. Started lantus, Per MD held sliding scale. Good PO intake. Good UOP, no BM this shift. No family at bedside, will continue to monitor.    Problem: Adult Inpatient Plan of Care  Goal: Plan of Care Review  Outcome: Ongoing - Unchanged  Goal: Optimal Comfort and Wellbeing  Outcome: Ongoing - Unchanged  Goal: Readiness for Transition of Care  Outcome: Ongoing - Unchanged

## 2019-08-22 LAB — AMMONIA: Ammonia:SCnc:Pt:Plas:Qn:: 46 — ABNORMAL HIGH

## 2019-08-22 LAB — COMPREHENSIVE METABOLIC PANEL
ALBUMIN: 2.3 g/dL — ABNORMAL LOW (ref 3.5–5.0)
ALKALINE PHOSPHATASE: 793 U/L — ABNORMAL HIGH (ref 65–260)
ALT (SGPT): 465 U/L — ABNORMAL HIGH (ref <50)
ANION GAP: 7 mmol/L (ref 7–15)
AST (SGOT): 197 U/L — ABNORMAL HIGH (ref 19–55)
BILIRUBIN TOTAL: 25.1 mg/dL — ABNORMAL HIGH (ref 0.0–1.2)
BLOOD UREA NITROGEN: 27 mg/dL — ABNORMAL HIGH (ref 7–21)
BUN / CREAT RATIO: 34
CALCIUM: 8 mg/dL — ABNORMAL LOW (ref 8.5–10.2)
CHLORIDE: 111 mmol/L — ABNORMAL HIGH (ref 98–107)
CO2: 22 mmol/L (ref 22.0–30.0)
EGFR CKD-EPI AA MALE: >90 mL/min/{1.73_m2} (ref >=60)
EGFR CKD-EPI NON-AA MALE: >90 mL/min/{1.73_m2} (ref >=60)
GLUCOSE RANDOM: 241 mg/dL — ABNORMAL HIGH (ref 70–179)
POTASSIUM: 4.3 mmol/L (ref 3.5–5.0)
PROTEIN TOTAL: 6.1 g/dL — ABNORMAL LOW (ref 6.5–8.3)
SODIUM: 140 mmol/L (ref 135–145)

## 2019-08-22 LAB — MAGNESIUM: Magnesium:MCnc:Pt:Ser/Plas:Qn:: 1.8

## 2019-08-22 LAB — CBC W/ AUTO DIFF
BASOPHILS ABSOLUTE COUNT: 0 10*9/L (ref 0.0–0.1)
BASOPHILS RELATIVE PERCENT: 0.1 %
EOSINOPHILS RELATIVE PERCENT: 0.2 %
HEMATOCRIT: 30.3 % — ABNORMAL LOW (ref 41.0–53.0)
HEMOGLOBIN: 10 g/dL — ABNORMAL LOW (ref 13.5–17.5)
LARGE UNSTAINED CELLS: 0 % (ref 0–4)
LYMPHOCYTES ABSOLUTE COUNT: 0.8 10*9/L — ABNORMAL LOW (ref 1.5–5.0)
LYMPHOCYTES RELATIVE PERCENT: 4.7 %
MEAN CORPUSCULAR HEMOGLOBIN CONC: 33 g/dL (ref 31.0–37.0)
MEAN CORPUSCULAR HEMOGLOBIN: 26.9 pg (ref 26.0–34.0)
MEAN CORPUSCULAR VOLUME: 81.5 fL (ref 80.0–100.0)
MEAN PLATELET VOLUME: 7.8 fL (ref 7.0–10.0)
MONOCYTES ABSOLUTE COUNT: 0.5 10*9/L (ref 0.2–0.8)
MONOCYTES RELATIVE PERCENT: 2.9 %
NEUTROPHILS ABSOLUTE COUNT: 15.9 10*9/L — ABNORMAL HIGH (ref 2.0–7.5)
NEUTROPHILS RELATIVE PERCENT: 92.1 %
PLATELET COUNT: 81 10*9/L — ABNORMAL LOW (ref 150–440)
RED BLOOD CELL COUNT: 3.71 10*12/L — ABNORMAL LOW (ref 4.50–5.90)
RED CELL DISTRIBUTION WIDTH: 26.6 % — ABNORMAL HIGH (ref 12.0–15.0)
WBC ADJUSTED: 17.3 10*9/L — ABNORMAL HIGH (ref 4.5–11.0)

## 2019-08-22 LAB — CD3 TX WORKUP, FLOW
ABSOLUTE CD3 CNT: 88 {cells}/uL — ABNORMAL LOW (ref 915–3400)
ABSOLUTE CD4 CNT: <10 {cells}/uL — ABNORMAL LOW (ref 510–2320)
ABSOLUTE CD8 CNT: 56 {cells}/uL — ABNORMAL LOW (ref 180–1520)
CD3% (T CELLS)": 11 % — ABNORMAL LOW (ref 61–86)
CD4% (T HELPER)": <1 % — ABNORMAL LOW (ref 34–58)
CD8% T SUPPRESR": 7 % — ABNORMAL LOW (ref 12–38)

## 2019-08-22 LAB — URINALYSIS
BLOOD UA: NEGATIVE
KETONES UA: NEGATIVE
LEUKOCYTE ESTERASE UA: NEGATIVE
NITRITE UA: NEGATIVE
PH UA: 6 (ref 5.0–9.0)
PROTEIN UA: NEGATIVE
RBC UA: <1 /HPF (ref <=3)
SPECIFIC GRAVITY UA: 1.014 (ref 1.003–1.030)
SQUAMOUS EPITHELIAL: <1 /HPF (ref 0–5)
UROBILINOGEN UA: 4 — AB

## 2019-08-22 LAB — GAMMA GLUTAMYL TRANSFERASE: Gamma glutamyl transferase:CCnc:Pt:Ser/Plas:Qn:: 542 — ABNORMAL HIGH

## 2019-08-22 LAB — PROTIME: Coagulation tissue factor induced:Time:Pt:PPP:Qn:Coag: 16.4 — ABNORMAL HIGH

## 2019-08-22 LAB — TACROLIMUS, TROUGH: Lab: 3.9 — ABNORMAL LOW

## 2019-08-22 LAB — BILIRUBIN DIRECT: Bilirubin.glucuronidated+Bilirubin.albumin bound:MCnc:Pt:Ser/Plas:Qn:: 22.6 — ABNORMAL HIGH

## 2019-08-22 LAB — ERYTHROCYTE SEDIMENTATION RATE: Lab: 25 — ABNORMAL HIGH

## 2019-08-22 LAB — EOSINOPHILS ABSOLUTE COUNT: Eosinophils:NCnc:Pt:Bld:Qn:Automated count: 0

## 2019-08-22 LAB — POTASSIUM: Potassium:SCnc:Pt:Ser/Plas:Qn:: 4.3

## 2019-08-22 LAB — C-REACTIVE PROTEIN: C reactive protein:MCnc:Pt:Ser/Plas:Qn:: 17.9 — ABNORMAL HIGH

## 2019-08-22 LAB — CD3% (T CELLS)": Lab: 11 — ABNORMAL LOW

## 2019-08-22 LAB — KETONES UA: Ketones:MCnc:Pt:Urine:Qn:Test strip: NEGATIVE

## 2019-08-22 LAB — PHOSPHORUS: Phosphate:MCnc:Pt:Ser/Plas:Qn:: 3.6

## 2019-08-22 NOTE — Unmapped (Signed)
VSS on room air. Afebrile. Tolerated completion of ATG. No hallucinations or neuro changes. PICC cdi. Good UOP, minimal food intake. No BM this shift. No family at bedside. Will continue to monitor.    Problem: Adult Inpatient Plan of Care  Goal: Plan of Care Review  Outcome: Ongoing - Unchanged  Goal: Optimal Comfort and Wellbeing  Outcome: Ongoing - Unchanged  Goal: Readiness for Transition of Care  Outcome: Ongoing - Unchanged

## 2019-08-22 NOTE — Unmapped (Addendum)
Pediatric Tacrolimus Therapeutic Monitoring Pharmacy Note  ??  [name] Gross??[name] Gross??is a 18 y.o.??male??continuing??tacrolimus.   ??  Indication:??Liver transplant??  ??  Date of Transplant: 09/22/2016??  ??  Current Dosing Information: Tacrolimus??1.5 mg PO BID  ??  Dosing Weight: 55.9 kg  ??  Goals:  Therapeutic Drug Levels  Tacrolimus trough goal: 8-10 ng/mL  ??  Additional Clinical Monitoring/Outcomes  ?? Monitor renal function (SCr and urine output) and liver function (LFTs)  ?? Monitor for signs/symptoms of adverse events (e.g., hyperglycemia, hyperkalemia, hypomagnesemia, hypertension, headache, tremor)  ??  Results:   Tacrolimus level:??3.9??ng/mL, level drawn ~12 hours.  ??  Longitudinal Dose Monitoring:  Date Dose (mg)  AM / PM AM Scr (mg/dL) Level  (ng/mL) Key Drug Interactions   08/22/19 1.5 mg/ 3 mg 0.79 3.9 None   08/20/19 0.75 mg / 1.5 mg 0.98 2.7 None   08/19/19 0.75 mg / 0.75 mg 0.95 3.4 None   08/18/19 0.5 mg / 0.75 mg 1.13 2.6 None   08/17/19 0.5 mg / 0.5 mg ??1.12 3.4 None   08/16/19 0.25 mg / 0.5 mg 1.09 3.2 None   08/15/19 0.25 mg/0.5 mg 1.04 4.2 None   08/14/19 0.25 mg/0.5 mg 1.06 4.4 None   08/13/19 0.25 mg/0.25 mg 1.28 5.5 None   08/12/19 --- / 0.25 mg 1.25 5.9 None   08/11/19 0.5 mg /??--- ??1.26 ??10.1 ??None   08/10/19 1 mg / HOLD 1.4 10.5 None   08/09/19 1 mg / 1 mg 1.29 --- None   08/08/19 3 mg / HOLD 1.5 11.9 None   08/06/19 5 mg / 3 mg 0.8 9 None   ??  Pharmacokinetic Considerations and Significant Drug Interactions:  ?? Concurrent hepatotoxic medications: Acetaminophen as pre-med for Thymoglobulin  ?? Concurrent CYP3A4 substrates/inhibitors: None identified  ?? Concurrent nephrotoxic medications: Valganciclovir, SMP/TMX  ??  Assessment/Plan:   Recommendedation(s)  ?? Increase regimen to 3 mg (0.05 mg/kg) every 12 hours   ?? Obtain next tacrolimus level on Thursday.  ?? Note, patient started on thymoglobulin and high dose steroids 08/11/19.  ??  Follow-up  ?? Next level ordered: 08/25/19 at 1000.  ?? A pharmacist will continue to monitor and recommend levels as appropriate  ??  The above plan was discussed and approved by Gildardo Griffes, MD.    Please page service pharmacist with questions/clarifications.    Selmer Dominion, PharmD  Pediatric Clinical Pharmacist  [phone] (pager)  [phone] (pediatric pharmacy)

## 2019-08-22 NOTE — Unmapped (Signed)
Torre' VSS, afebrile today. No complaints of pain or n/v. R. Arm PICC c/d/I, infusing IVF as ordered. Caps and lines changed with labs in AM. BC X 1 drawn from PICC per orders d/t elevated WBC. S/p albumin, vit K and ATG today. Received pre-med X3 before the ATG. Voiding well, no BM today. Corrected BG X2 before meals. No family at bedside, no calls for updates. Will continue to monitor.   Problem: Adult Inpatient Plan of Care  Goal: Plan of Care Review  Outcome: Ongoing - Unchanged  Goal: Patient-Specific Goal (Individualization)  Outcome: Ongoing - Unchanged  Goal: Absence of Hospital-Acquired Illness or Injury  Outcome: Ongoing - Unchanged  Goal: Optimal Comfort and Wellbeing  Outcome: Ongoing - Unchanged  Goal: Readiness for Transition of Care  Outcome: Ongoing - Unchanged  Goal: Rounds/Family Conference  Outcome: Ongoing - Unchanged     Problem: Wound  Goal: Optimal Wound Healing  Outcome: Ongoing - Unchanged     Problem: Fall Injury Risk  Goal: Absence of Fall and Fall-Related Injury  Outcome: Ongoing - Unchanged     Problem: Self-Care Deficit  Goal: Improved Ability to Complete Activities of Daily Living  Outcome: Ongoing - Unchanged

## 2019-08-22 NOTE — Unmapped (Signed)
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    He is eating/snacking frequently and not always gettign insulin for PO intake. Two options: We can either increase his lantus to cover for frequent PO intake. If doing this would increase to 8units nightly. OR we can be more insistent that he speaks with nursing every time he eats to get a dose of PRN insulin. He seems like he does not want more shots, so my recommendation would be increase lantus.   ??  Plan  -??Increase lantus to 8U nightly. If he is NPO or sick, decrease back to 5 U nightly.   -??Continue??4units TIDAC lispro with meals??  -??Can add??PRN dose for overnight meal: if a normal sized meal give 4 units, if smaller give 2 units lispro  - continue sensitive lispro sliding scale??with meals and in the evening.??  -??I would recommend sugar free drinks or water to meet his fluid goal. Encouraged decrease in sugar drinks.??  ??  Discussed with Dr. Marcello Fennel??  ??  Primary team informed of recommendations. We will continue to follow patient as needed. Please call/page [phone] with questions or concerns.   ??  T. Gillie Manners, MD  [facility] Endocrinology Fellow     I was immediately available via phone/pager or present on site.  I reviewed and discussed the case with the resident, but did not see the patient.  I agree with the assessment and plan as documented in the resident's note.     Maximino Greenland, MD, MPH  Attending - Endocrinology, Diabetes, and Metabolism    Time spent reviewing chart 10 minutes  Time spent with fellow 10 minutes        Subjective/24 hour events:    Sugars in 200s over past 24 hours. Getting correction but seems to stay in stable numbers after. Definitely eating overnight and not getting insulin for all meals or snacks.     Objective: :  BP (P) 95/54  - Pulse (P) 76  - Temp (P) 37.1 ??C (Oral)  - Resp (P) 26  - Ht 174.6 cm (5' 8.74)  - Wt 59.4 kg (130 lb 14.4 oz)  - SpO2 (P) 98%  - BMI 19.48 kg/m??     Physical Exam:  General:??NAD  Eyes: EOMI. Scleral icterus present  Lungs: No increased work of breathing.   Neuro: no focal deficits noted.  Skin:WWP??  ??    Test Results    Lab Results   Component Value Date    POCGLU 238 (H) 08/21/2019    POCGLU 221 (H) 08/21/2019    POCGLU 232 (H) 08/21/2019    POCGLU 277 (H) 08/20/2019    POCGLU 232 (H) 08/20/2019     Lab Results   Component Value Date    CREATININE 0.94 08/21/2019    NA 140 08/21/2019    K 4.6 08/21/2019    CL 108 (H) 08/21/2019    CO2 23.0 08/21/2019    ANIONGAP 9 08/21/2019    CALCIUM 8.1 (L) 08/21/2019    ALBUMIN 2.4 (L) 08/21/2019

## 2019-08-22 NOTE — Unmapped (Signed)
[name] Gross' VSS, afebrile today. No complaint of pain. R. Arm PICC c/d/I. Remained heparin locked for most of day until ATG begun. S/p premed X3 before ATG. Tolerating infusion well, no adverse events noted. Needed insulin correction X2 before meals today.  All other meds tolerated well. Voiding appropriately, urine remains amber in color. No BM. Good PO intake. No family/friends at bedside, no calls for updates. Will continue to monitor.    Problem: Adult Inpatient Plan of Care  Goal: Plan of Care Review  Outcome: Ongoing - Unchanged  Goal: Patient-Specific Goal (Individualization)  Outcome: Ongoing - Unchanged  Goal: Absence of Hospital-Acquired Illness or Injury  Outcome: Ongoing - Unchanged  Goal: Optimal Comfort and Wellbeing  Outcome: Ongoing - Unchanged  Goal: Readiness for Transition of Care  Outcome: Ongoing - Unchanged  Goal: Rounds/Family Conference  Outcome: Ongoing - Unchanged     Problem: Wound  Goal: Optimal Wound Healing  Outcome: Ongoing - Unchanged     Problem: Fall Injury Risk  Goal: Absence of Fall and Fall-Related Injury  Outcome: Ongoing - Unchanged     Problem: Self-Care Deficit  Goal: Improved Ability to Complete Activities of Daily Living  Outcome: Ongoing - Unchanged

## 2019-08-22 NOTE — Unmapped (Signed)
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 Patient has discussed prognosis with Dr. Melrose Nakayama and palliative care (10/9). Patient understands that transplant is and option but that he may not be a candidate.We will continue to discuss his options with him. His active issues are a tenuous fluid balance, balancing intravascular volume and edema. He has ascites and now with pitting edema on exam. We will continue to encouraging exclusive PO intake, and may consider further albumin infusion for hypoalbuminemia in the future. He has steroid-induced hyperglycemia, for which we have consulted adult Endocrine and started meal coverage and sliding scale lispro and will continue to monitor his sugars. His R ear and L posterior neck lesions are improving with Mupirocin.  Today patient developed leukocytosis concerning for infection and possible SBP.  Bedside paracentesis was attempted but no sufficient fluid pocket was noted.  Plan for paracentesis with liver biopsy as soon as VIR is able to. Remaining plan as follows:   ??  # Hx Liver Transplant - Subacute/Acute Liver Failure: ??Pt has biopsy confirmed severe acute rejection (9/25), RAI 9/9. Labs today essentially unchanged w/out robust response to Thymo, however, overall trend of AST and alk phos during this admission has been downward a bit. Per tranplant team recs, will consider repeating liver biopsy to confirm that his livery disease is still consistent with rejection in the setting of poor response to immunosuppression  - Day 11 of Thymoglobulin with MethylPrednisolone (10/1-)   - monitor labs daily for response (CBC, CMP, mag, phos, GGT), overall poor response thus far  - Increae tacrolimus to 3 mg BID 10/12 per pharmacy recs. Goal trough 10-12 ng/mL, per transplant team recs   - next level Thursday 10/15  - Continue Valgancyclovir 450mg  daily (3months) and Bactrim MWF (3mo) for prophylaxis (10/2-)  - Continue on Nystatin 10ml TID (1mo) prophylaxis (10/2-)  - Mycophenolate 540mg  bid    - PT/INR qod  - Protonix 40 mg PO daily  - Repeat CD3 today    Leukocytosis  Developed sudden leukocytosis with white count elevated to 17.3 today.  Differential includes infections of unknown source including SBP.  Patient has no localizing symptoms of infection and no fever.  Will complete infectious work-up today.  Lung sounds diminished to right lower lobe, so will include chest x-ray in work-up today.  - Blood culture x2  - UA  - CXR  - Diagnostic paracentesis attempted at bedside, however no sufficient fluid pocket was found.  Patient will need a biopsy by VIR and plan to perform paracentesis at the same time.    Edema, ascites, Elevated BUN, Cr above baseline (0.6)  -  Monitor fluid status and daily weights  - Encouraging PO intake (>1.6L of fluids PO/day)   - STRICT I/Os      Nausea/emesis  - Zofran??q8 hours PO PRN    Nutrition  - Regular diet  - Glucerna or Ensure supplements   - Ergocalciferol qMon   - aquadex multivitamin    - Daily CMP, Mg, Ph    Hyperglycemia    - Start 5 units Lantus 10/10   - Continue 4 units TIDAC lispro w/meals   - PRN dose for overnight meal; if a normal sized meal give 4 units, if smaller meal give 2 units   - continue sensitive lispro sliding scale with meals and in the evenings   - glucose checks qAC (including nighttime meal)    #Lesion on Right Ear and Left posterior Neck, culture +staph aureus   -Dermatology consulted, appreciate recs  -Mupirocin ointment for both lesions until healed, per derm rec    #Constipation  -Continue Miralax 17 g BID PRN     #Deconditioning  Patient endorses feeling weak and having difficulty in moving around  -PT following  -Encourage OOB and ambulation  ??  #Severe Malnutrition:????Prior to admission, patient has been skipping meals due to poor appetite and food insecurity. Albumin persistently low in setting of liver disease.  - Nutrition consult, appreciate recs  - Thiamine   - Multivitamin, Ca 300 mg BID, vit D 1000u QD   - Monitor phosphorous level  ??  # Major Depressive Disorder, insomnia: Stable. Has hx of attempted overdoses, with the last attempt in April 2019??at which point he was hospitalized in the Adolescent Psychiatry unit and discharged on Lexapro 20 mg. ??He has not engaged with mental health resources or taken medication for depression or insomnia in 8-10 months.????Denies SI, passive suicidality, self-harm, or HI at this time.   - Patient willing to work with psych. States he would like to see Dr. Ike Bene again.  - melatonin 5 mg 1 hour before bedtime    # Homelessness - Social Concerns:??Jery has experienced homelessness for ~5 months when he left his mother's house. ??He has severed all ties with his family and states his relationship with his mother cannot be reconciled. ??He also requested contact information of family members to be erased from his chart (this has been completed, but social worker did drop contact info into her note on 9/24 in case he changes his mind during admission). ??In addition to food insecurity, he has not been able to secure medications. ??He is routinely exposed to alcohol and drugs (marijuana, cocaine, etc.) second-hand through his friendships though he denies current use himself. ??On urine drug screen, he tested positive for marijuana only.  -Supportive care following. Would like Riordan to work on naming a Runner, broadcasting/film/video in the event he cannot make decisions for himself. He has identified a few friends as possibilities.   ??  Access:??PIV, PICC (placed 9/28)  ??  Discharge criteria:??liver transplant work-up and treatment of acute rejection    Subjective:     Interval History:   Patient sitting up, eating her to eat breakfast this morning.  States he feels well.  However, he developed leukocytosis this morning concerning for infection and potentially SBP.      Objective:     Vital signs in last 24 hours:  Temp:  [36.9 ??C-37.2 ??C] 37.1 ??C  Heart Rate:  [76-98] 98  Resp:  [24-29] 26  BP: (95-113)/(51-66) 113/66  MAP (mmHg):  [65-82] 82  SpO2:  [97 %-100 %] 100 %  Intake/Output last 3 shifts:  I/O last 3 completed shifts:  In: 3192.4 [P.O.:2420; I.V.:603.4; IV Piggyback:169]  Out: 4000 [Urine:4000]    Physical Exam:  General:   Laying in bed, awake, very thin and weak appearing  Head: normocephalic, atraumatic.  Eyes: EOMI. Scleral icterus present.   Nose:   clear, no discharge  Lungs: clear breath sounds bilaterally. No increased work of breathing. No wheezes/crackles.  Heart: regular rate and rhythm. No murmurs appreciated on auscultation.  Abdomen:  Abdomen soft, distended, soft, TTP in RUQ and RLQ. Ascites present  Neuro: deconditioned, moves all extremities with decreased strength  Extremities:. Pitting edema in the LE, [facility]  Skin:Skin lesion present on right ear and left neck, healing well. Jaundiced throughout. Acne present on face, back, chest    Active Medications reviewed and KEY Medications include:     Current Facility-Administered Medications:   ???  acetaminophen (TYLENOL) tablet 650 mg, 650 mg, Oral, Once, Sallyanne Havers, MD  ???  antithymocyte globulin (rabbit) (THYMOGLOBULIN) 100 mg in sodium chloride (NS) 0.9 % 250 mL CENTRAL LINE formulation, 100 mg, Intravenous, Once, Sallyanne Havers, MD  ???  calcium carbonate (OS-CAL) tablet 300 mg elem calcium, 300 mg elem calcium, Oral, BID, Anastasia Pall, MD, 300 mg elem calcium at 08/22/19 0943  ???  dextrose 50 % in water (D50W) 50 % solution 12.5 g, 12.5 g, Intravenous, Q10 Min PRN, Ejiofor A Lucky Cowboy., MD  ???  diphenhydrAMINE (BENADRYL) capsule/tablet 50 mg, 50 mg, Oral, Once, Sallyanne Havers, MD  ???  Implement, , , Until Discontinued **AND** Care order/instruction, , , PRN **AND** Vital signs, , , PRN **AND** Nursing oxygen orders / instructions, , , PRN **AND** sodium chloride (NS) 0.9 % infusion, 20 mL/hr, Intravenous, Continuous PRN, Stopped at 08/19/19 0700 **AND** sodium chloride 0.9% (NS) bolus 1,000 mL, 1,000 mL, Intravenous, Daily PRN **AND** diphenhydrAMINE (BENADRYL) injection 25 mg, 25 mg, Intravenous, Q4H PRN **AND** famotidine (PEPCID) injection 20 mg, 20 mg, Intravenous, Q4H PRN **AND** methylPREDNISolone sodium succinate (PF) (Solu-MEDROL) injection 125 mg, 125 mg, Intravenous, Q4H PRN **AND** EPINEPHrine (EPIPEN) injection 0.3 mg, 0.3 mg, Intramuscular, Daily PRN, Almon Register, MD  ???  ergocalciferol (DRISDOL) capsule 50,000 Units, 50,000 Units, Oral, Weekly, Ejiofor A Lucky Cowboy., MD, 50,000 Units at 08/22/19 0848  ???  heparin preservative-free injection 10 units/mL syringe (HEPARIN LOCK FLUSH), 20 Units, Intravenous, Daily PRN, Anastasia Pall, MD, 20 Units at 08/22/19 0925  ???  influenza vaccine quad (FLUARIX, FLULAVAL, FLUZONE) (6 MOS & UP) 2020-21, 0.5 mL, Intramuscular, During hospitalization, Anastasia Pall, MD  ???  insulin glargine (LANTUS) injection 5 Units, 5 Units, Subcutaneous, Nightly, Johnanna Schneiders, MD  ???  insulin lispro (HumaLOG) injection 0-5 Units, 0-5 Units, Subcutaneous, ACHS, David Stall, MD, 2 Units at 08/22/19 1058  ??? insulin lispro (HumaLOG) injection 2-4 Units, 2-4 Units, Subcutaneous, Nightly PRN, David Stall, MD  ???  insulin lispro (HumaLOG) injection 4 Units, 4 Units, Subcutaneous, TID AC, Dannielle Huh, MD, 4 Units at 08/22/19 1059  ???  melatonin tablet 6 mg, 6 mg, Oral, QPM, David Stall, MD  ???  methylPREDNISolone sodium succinate (PF) (Solu-MEDROL) injection 500 mg, 500 mg, Intravenous, Once, Sallyanne Havers, MD  ???  multivitamin, with zinc (AQUADEKS) chewable tablet, 2 tablet, Oral, Daily, Ejiofor A Lucky Cowboy., MD, 2 tablet at 08/22/19 0848  ???  mupirocin (BACTROBAN) 2 % ointment, , Topical, TID, Darden Dates  Gutierrez-Wu, MD, 1 application at 08/22/19 1405  ???  mycophenolate (MYFORTIC) EC tablet 540 mg, 540 mg, Oral, BID, Anastasia Pall, MD, 540 mg at 08/22/19 0940  ???  nystatin (MYCOSTATIN) oral suspension, 1,000,000 Units, Oral, TID, Anastasia Pall, MD, 1,000,000 Units at 08/22/19 1405  ???  ondansetron (ZOFRAN) injection 4 mg, 4 mg, Intravenous, Once, Ejiofor A Lucky Cowboy., MD  ???  ondansetron (ZOFRAN-ODT) disintegrating tablet 4 mg, 4 mg, Oral, Q8H PRN, David Stall, MD  ???  pantoprazole (PROTONIX) EC tablet 40 mg, 40 mg, Oral, Daily, Almon Register, MD, 40 mg at 08/21/19 2105  ???  phytonadione (vitamin K1) (AQUA-MEPHYTON) 7.5 mg in sodium chloride (NS) 0.9 % 50 mL IVPB, 7.5 mg, Intravenous, Daily, David Stall, MD  ???  polyethylene glycol (MIRALAX) packet 17 g, 17 g, Oral, BID PRN, David Stall, MD  ???  rifAXIMin (XIFAXAN) tablet 550 mg, 550 mg, Oral, BID, David Stall, MD  ???  sodium chloride 0.45% (1/2 NS) infusion, 10 mL/hr, Intravenous, Continuous, Val Eagle, Last Rate: 10 mL/hr at 08/22/19 0711, 10 mL/hr at 08/22/19 1610  ???  sulfamethoxazole-trimethoprim (BACTRIM) 400-80 mg tablet 80 mg of trimethoprim, 1 tablet, Oral, Q MWF, Ejiofor A Lucky Cowboy., MD, 80 mg of trimethoprim at 08/22/19 0849  ???  tacrolimus (PROGRAF) capsule 3 mg, 3 mg, Oral, BID, David Stall, MD  ???  valGANciclovir (VALCYTE) tablet 450 mg, 450 mg, Oral, Daily, Almon Register, MD, 450 mg at 08/21/19 1755        Studies: Personally reviewed and interpreted.  Labs/Studies:  Labs and Studies from the last 24hrs per EMR and Reviewed and   All lab results last 24 hours:    Recent Results (from the past 24 hour(s))   POCT Glucose    Collection Time: 08/21/19  3:42 PM   Result Value Ref Range    Glucose, POC 221 (H) 70 - 179 mg/dL   POCT Glucose    Collection Time: 08/21/19  8:58 PM   Result Value Ref Range    Glucose, POC 238 (H) 70 - 179 mg/dL   Bilirubin, Direct    Collection Time: 08/22/19  9:34 AM   Result Value Ref Range    Bilirubin, Direct 22.60 (H) 0.00 - 0.40 mg/dL   Comprehensive Metabolic Panel    Collection Time: 08/22/19  9:34 AM   Result Value Ref Range    Sodium 140 135 - 145 mmol/L    Potassium 4.3 3.5 - 5.0 mmol/L    Chloride 111 (H) 98 - 107 mmol/L    Anion Gap 7 7 - 15 mmol/L    CO2 22.0 22.0 - 30.0 mmol/L    BUN 27 (H) 7 - 21 mg/dL    Creatinine 9.60 4.54 - 1.30 mg/dL    BUN/Creatinine Ratio 34     EGFR CKD-EPI Non-African American, Male >90 >=60 mL/min/1.38m2    EGFR CKD-EPI African American, Male >90 >=60 mL/min/1.6m2    Glucose 241 (H) 70 - 179 mg/dL    Calcium 8.0 (L) 8.5 - 10.2 mg/dL    Albumin 2.3 (L) 3.5 - 5.0 g/dL    Total Protein 6.1 (L) 6.5 - 8.3 g/dL    Total Bilirubin 09.8 (H) 0.0 - 1.2 mg/dL    AST 119 (H) 19 - 55 U/L    ALT 465 (H) <50 U/L    Alkaline Phosphatase 793 (H) 65 - 260 U/L   Gamma GT (GGT)    Collection Time:  08/22/19  9:34 AM   Result Value Ref Range    GGT 542 (H) 12 - 109 U/L   Magnesium Level    Collection Time: 08/22/19  9:34 AM   Result Value Ref Range    Magnesium 1.8 1.6 - 2.2 mg/dL   Phosphorus Level    Collection Time: 08/22/19  9:34 AM   Result Value Ref Range    Phosphorus 3.6 2.9 - 4.7 mg/dL   Tacrolimus Level, Trough    Collection Time: 08/22/19  9:34 AM   Result Value Ref Range    Tacrolimus, Trough 3.9 (L) 5.0 - 15.0 ng/mL   Ammonia    Collection Time: 08/22/19  9:34 AM   Result Value Ref Range    Ammonia 46 (H) 9 - 33 umol/L   CBC w/ Differential    Collection Time: 08/22/19  9:34 AM   Result Value Ref Range    WBC 17.3 (H) 4.5 - 11.0 10*9/L    RBC 3.71 (L) 4.50 - 5.90 10*12/L    HGB 10.0 (L) 13.5 - 17.5 g/dL    HCT 16.1 (L) 09.6 - 53.0 %    MCV 81.5 80.0 - 100.0 fL    MCH 26.9 26.0 - 34.0 pg    MCHC 33.0 31.0 - 37.0 g/dL    RDW 04.5 (H) 40.9 - 15.0 %    MPV 7.8 7.0 - 10.0 fL    Platelet 81 (L) 150 - 440 10*9/L    Neutrophils % 92.1 %    Lymphocytes % 4.7 %    Monocytes % 2.9 %    Eosinophils % 0.2 %    Basophils % 0.1 %    Neutrophil Left Shift 1+ (A) Not Present    Absolute Neutrophils 15.9 (H) 2.0 - 7.5 10*9/L    Absolute Lymphocytes 0.8 (L) 1.5 - 5.0 10*9/L    Absolute Monocytes 0.5 0.2 - 0.8 10*9/L    Absolute Eosinophils 0.0 0.0 - 0.4 10*9/L    Absolute Basophils 0.0 0.0 - 0.1 10*9/L    Large Unstained Cells 0 0 - 4 %    Microcytosis Marked (A) Not Present    Macrocytosis Slight (A) Not Present    Anisocytosis Marked (A) Not Present    Hypochromasia Slight (A) Not Present   PT-INR    Collection Time: 08/22/19  9:34 AM   Result Value Ref Range    PT 16.4 (H) 10.2 - 13.1 sec    INR 1.42    C-reactive protein    Collection Time: 08/22/19  9:34 AM   Result Value Ref Range    CRP 17.9 (H) <10.0 mg/L   POCT Glucose    Collection Time: 08/22/19  9:36 AM   Result Value Ref Range    Glucose, POC 214 (H) 70 - 179 mg/dL   Sedimentation Rate    Collection Time: 08/22/19 12:57 PM   Result Value Ref Range    Sed Rate 25 (H) 0 - 15 mm/h     ========================================      Harlow Mares, MD  Med/Peds, PGY-1  [phone]

## 2019-08-22 NOTE — Unmapped (Signed)
[facility] - Liver Biopsy Consultation Note    Subjective:       Reason for Liver Biopsy:  2 OLT concerns for rejection    Requesting team:  Pediatrics- GI    HPI:   Fontaine is a 18 y.o. 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  Pt s/p liver biopsy 9/25.  Pt currently undergoing treatment for rejection with no significant changes.  Pt. Now with ascites noted on MRCP and elevated WBC at 17.  Team requesting evaluation for liver biopsy and diagnostic/therapeutic paracentesis to r/o SBP.        Objective:      Imaging Studies:  MRI Abd (MRCP), 08/19/2019 reviewed     Pertinent Laboratory Values:  WBC   Date Value Ref Range Status   08/22/2019 17.3 (H) 4.5 - 11.0 10*9/L Final   02/11/2019 2.8 (L) 3.4 - 10.8 x10E3/uL Final     HGB   Date Value Ref Range Status   08/22/2019 10.0 (L) 13.5 - 17.5 g/dL Final   16/08/9603 54.0 13.0 - 17.7 g/dL Final     Hemoglobin   Date Value Ref Range Status   09/22/2016 9.6 (L) 13.5 - 17.5 g/dL Final     Comment:     Point of Care Testing performed at the point of care by trained personnel per documented policies.     HCT   Date Value Ref Range Status   08/22/2019 30.3 (L) 41.0 - 53.0 % Final   02/11/2019 40.4 37.5 - 51.0 % Final     Platelet   Date Value Ref Range Status   08/22/2019 81 (L) 150 - 440 10*9/L Final   02/11/2019 197 150 - 450 x10E3/uL Final     INR   Date Value Ref Range Status   08/22/2019 1.42  Final     Creatinine   Date Value Ref Range Status   08/22/2019 0.79 0.70 - 1.30 mg/dL Final   98/09/9146 8.29 (H) 0.76 - 1.27 mg/dL Final     Total Bilirubin   Date Value Ref Range Status   08/22/2019 25.1 (H) 0.0 - 1.2 mg/dL Final   56/21/3086 1.2 0.0 - 1.2 mg/dL Final     AST   Date Value Ref Range Status   08/22/2019 197 (H) 19 - 55 U/L Final   02/11/2019 214 (H) 0 - 40 IU/L Final       Anticoaguation: No      Allergies:     Allergies   Allergen Reactions   ??? Bee Pollen Rash     Pt has seasonal allergies that cause excessive sneezing and running nose- [name], NAII   ??? Pollen Extracts Rash     Pt has seasonal allergies that cause excessive sneezing and running noseCardell Peach       Physical Exam:    Vitals:    08/22/19 0800   BP: 95/54   Pulse: 76   Resp: 26   Temp: 37.1 ??C (98.8 ??F)   SpO2: 98%     General: male in NAD.  Airway assessment: per general anesthesia   Lungs: respirations even, unlabored   ASA Grade: ASA 3 - Patient with moderate systemic disease with functional limitations    Assessment:     Mr. Schreck is a 18 y.o. 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  Pt currently being treated for rejection with no improvement.  No with ascites noted on MRCP.  Team requesting repeat biopsy for staging of rejection.  Pertinent history, imaging and laboratory values in patient's medical record have been reviewed.     Plan/Recommendations:         Spoke with team (Ped GI- Sarah).  Given patient has ascites, it may not be safe to proceed with liver biopsy from percutaneous approach, we may have to proceed from transjugular approach.  Given timing with General anesthesia, team plans to attempt diagnostic/therapeutic paracentesis at the bedside.  We will determine percutaneous versus transjugular with pre U/S  at the time of procedure.       -VIR recommends proceeding with liver biopsy with general anesthesia.  Percutaneous possible transjugular pending pre U/S at time of procedure.    - Anticipated procedure date: Tomorrow with GA   - Please make NPO night prior to procedure  - Please ensure recent CBC, Creatinine, and INR are available        Informed Consent:  This procedure has been fully reviewed with the patient/patient???s authorized representative. The risks, benefits and alternatives have been explained, and the patient/patient???s authorized representative has consented to the procedure.  --The patient will accept blood products in an emergent situation.  --The patient does not have a Do Not Resuscitate order in effect.      The patient was discussed with  Dr. Claretta Fraise.     Thank you for involving [place] in the care of this patient. Please page the VIR consult pager [phone]) with further questions, concerns, or if new issues arise.      Burney Gauze, FNP, August 22, 2019, 11:29 AM

## 2019-08-23 LAB — ANION GAP: Anion gap 3:SCnc:Pt:Ser/Plas:Qn:: 0

## 2019-08-23 LAB — HLA DS POST TRANSPLANT
ANTI-DONOR DRW #2 MFI: 53 MFI
ANTI-DONOR HLA-A #2 MFI: 149 MFI
ANTI-DONOR HLA-B #1 MFI: 179 MFI
ANTI-DONOR HLA-B #2 MFI: 103 MFI
ANTI-DONOR HLA-C #1 MFI: 102 MFI
ANTI-DONOR HLA-C #2 MFI: 346 MFI
ANTI-DONOR HLA-DP #2 MFI: 61 MFI
ANTI-DONOR HLA-DQB #2 MFI: 9019 MFI — ABNORMAL HIGH
ANTI-DONOR HLA-DR #1 MFI: 55 MFI
ANTI-DONOR HLA-DR #2 MFI: 87 MFI

## 2019-08-23 LAB — COMPREHENSIVE METABOLIC PANEL
ALBUMIN: 2.3 g/dL — ABNORMAL LOW (ref 3.5–5.0)
ALKALINE PHOSPHATASE: 675 U/L — ABNORMAL HIGH (ref 65–260)
AST (SGOT): 154 U/L — ABNORMAL HIGH (ref 19–55)
BILIRUBIN TOTAL: 25.6 mg/dL — ABNORMAL HIGH (ref 0.0–1.2)
BLOOD UREA NITROGEN: 26 mg/dL — ABNORMAL HIGH (ref 7–21)
BUN / CREAT RATIO: 33
CALCIUM: 7.9 mg/dL — ABNORMAL LOW (ref 8.5–10.2)
CHLORIDE: 110 mmol/L — ABNORMAL HIGH (ref 98–107)
CREATININE: 0.8 mg/dL (ref 0.70–1.30)
EGFR CKD-EPI AA MALE: >90 mL/min/{1.73_m2} (ref >=60)
EGFR CKD-EPI NON-AA MALE: >90 mL/min/{1.73_m2} (ref >=60)
POTASSIUM: 4 mmol/L (ref 3.5–5.0)
SODIUM: 138 mmol/L (ref 135–145)

## 2019-08-23 LAB — CBC W/ AUTO DIFF
BASOPHILS ABSOLUTE COUNT: 0 10*9/L (ref 0.0–0.1)
BASOPHILS RELATIVE PERCENT: 0 %
EOSINOPHILS ABSOLUTE COUNT: 0 10*9/L (ref 0.0–0.4)
EOSINOPHILS RELATIVE PERCENT: 0.2 %
HEMATOCRIT: 26.6 % — ABNORMAL LOW (ref 41.0–53.0)
HEMOGLOBIN: 8.9 g/dL — ABNORMAL LOW (ref 13.5–17.5)
LYMPHOCYTES ABSOLUTE COUNT: 0.7 10*9/L — ABNORMAL LOW (ref 1.5–5.0)
LYMPHOCYTES RELATIVE PERCENT: 6.9 %
MEAN CORPUSCULAR HEMOGLOBIN CONC: 33.3 g/dL (ref 31.0–37.0)
MEAN CORPUSCULAR HEMOGLOBIN: 27.2 pg (ref 26.0–34.0)
MEAN CORPUSCULAR VOLUME: 81.7 fL (ref 80.0–100.0)
MEAN PLATELET VOLUME: 7.5 fL (ref 7.0–10.0)
MONOCYTES ABSOLUTE COUNT: 0.2 10*9/L (ref 0.2–0.8)
MONOCYTES RELATIVE PERCENT: 2.5 %
NEUTROPHILS ABSOLUTE COUNT: 8.4 10*9/L — ABNORMAL HIGH (ref 2.0–7.5)
NEUTROPHILS RELATIVE PERCENT: 87 %
PLATELET COUNT: 58 10*9/L — ABNORMAL LOW (ref 150–440)
RED BLOOD CELL COUNT: 3.25 10*12/L — ABNORMAL LOW (ref 4.50–5.90)
RED CELL DISTRIBUTION WIDTH: 26.1 % — ABNORMAL HIGH (ref 12.0–15.0)
WBC ADJUSTED: 9.6 10*9/L (ref 4.5–11.0)

## 2019-08-23 LAB — MAGNESIUM: Magnesium:MCnc:Pt:Ser/Plas:Qn:: 1.6

## 2019-08-23 LAB — GAMMA GLUTAMYL TRANSFERASE: Gamma glutamyl transferase:CCnc:Pt:Ser/Plas:Qn:: 459 — ABNORMAL HIGH

## 2019-08-23 LAB — PHOSPHORUS
PHOSPHORUS: 3 mg/dL (ref 2.9–4.7)
Phosphate:MCnc:Pt:Ser/Plas:Qn:: 3

## 2019-08-23 LAB — HYPOCHROMIA

## 2019-08-23 LAB — FSAB CLASS 1 ANTIBODY SPECIFICITY: HLA CLASS 1 ANTIBODY RESULT: POSITIVE

## 2019-08-23 LAB — BILIRUBIN DIRECT: Bilirubin.glucuronidated+Bilirubin.albumin bound:MCnc:Pt:Ser/Plas:Qn:: 22.1 — ABNORMAL HIGH

## 2019-08-23 LAB — ANTI-DONOR HLA-DQB #2 MFI: Lab: 9019 — ABNORMAL HIGH

## 2019-08-23 LAB — INR: Coagulation tissue factor induced.INR:RelTime:Pt:PPP:Qn:Coag: 1.36

## 2019-08-23 LAB — HLA CL2 AB RESULT: Lab: POSITIVE

## 2019-08-23 LAB — HLA CL1 ANTIBODY COMM: Lab: 0

## 2019-08-23 LAB — PROTIME-INR: INR: 1.36

## 2019-08-23 NOTE — Unmapped (Signed)
Pt afebrile, VSS. Received ATG overnight, tolerated well. No c/o pain overnight. Voiding adequately. IV abx started once ATG came down at 0330 d/t +bld cx. Resting well. No changes in vitals despite infection at this time. PICC c/d/I labs drawn, IVF running KVO. Paged MD about possibly increasing IVF d/t vanc, waiting on response. Will continue to monitor.

## 2019-08-23 NOTE — Unmapped (Signed)
Pediatric Infectious Disease Inpatient Consult Note    I attest that I have reviewed the student note and that the components of the history of the present illness, the physical exam, and the assessment and plan documented were performed by me or were performed in my presence by the student where I verified the documentation and performed (or re-performed) the exam and medical decision making.  Assessment:  [name] Gross is a 18 y.o. male with leukocytosis and strep mitis bacteremia in the setting of acute liver transplant rejection/failure. [name] Gross has been hospitalized with culture-confirmed acute rejection since 9/24, and has been receiving high-dose immunosuppression with thymoglobulin, steroids, tacrolimus, and mycophenolate, with only modest improvement of his liver function. Ascites has increased throughout this time as well. On 10/12 he was found to have leukocytosis without any localizing symptoms of infections, including no fever, no new/changed abdominal pain, and no N/V/D. Broad coverage was initiated with ceftriaxone, flagyl, micafungin, and vancomycin. Peripheral as well as central line blood cultures have been positive for strep mitis, which suggest a systemic source.  Patient has a PICC line which has been increased for the last 15 days however with ascites, would need to ensure that fluid is not infected as a strep viridans species have been causative agents for spontaneous bacterial peritonitis.  He does have abdominal tenderness on exam today in the right upper quadrant but per primary team his abdominal tenderness has been improving.  Would still recommend sampling of fluid and sending for bacterial culture to ensure  not infected and possible source of bacteremia.    Problem List  -Liver transplant rejection/failure  -Sickle cell trait  -Leukocytosis  -Strep mitis bacteremia  -Major depressive disorder  -Malnutrition  -Homelessness, food insecurity     Recommendations:  -Ceftriaxone 2g q24h to cover for strep mitis bacteremia.  Will follow clinical course to determine antibiotic duration.  This will also provide additional broad-spectrum gram-negative coverage for intra-abdominal process.  -Recommend sampling of ascitic fluid to send for Gram stain, white cells and cultures.  -Can continue-Vancomycin 15mg /kg q8h: may discontinue at 48hrs, if no new growth on blood cultures (10/15 am)  -Would discontinue micafungin at this time as no concerns for fungal infection at this time.  -Continue Flagyl  -Recommend echo to rule out vegetation  Thank you for asking [place] to see [name] Gross. We will continue to follow.   Merlinda Frederick, MS4  Pediatric Infectious Diseases    History of Present Illness:    History obtained from patient, primary team, review of records.     [name] Gross is a 18 y.o. 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 [name] Gross presented to care on 9/24 with several months of scleral icterus night sweats, diarrhea, and nausea, accompanied by 1 day of black stools and pain with bowel movements. On presentation, he had significant transaminitis, hyperbilirubinemia, hepatosplenomegaly, hypoalbuminemia, and hyperammonemia. Initial CT demonstrated trace ascites and a liver biopsy confirmed severe acute rejection. His acute rejection has been treated with 12 days of Thymoglobulin and high-dose methylprednisolone as well as tacrolimus and mycophenolate. Unfortunately, response has not been robust, with only modest down-trend of LFTs during this admission. He has also been receiving prophylactic valgancyclovir, bactrim, and nystatin throughout this hospitalization.     On 10/12, [name] Gross had a sudden leukocytosis with max WBC 17.3, which has since  downtrended to 9.6. He did not have any clinical localizing symptoms of infection, including no fever, URI symptoms, urinary symptoms, cough, shortness of breath, N/V/D, new skin changes, or new/different abdominal pain. He has had some mild abdominal discomfort and tenderness to palpation, which has actually been improved over the recent days. A CXR was obtained given concern for diminished lung sounds in the RLL, demonstrating R basilar atelectasis vs airspace disease with trace pleural effusion. Blood cultures were obtained from his R single lumen PICC and peripherally: both cultures were positive for GPC in chains. Bedside paracentesis was attempted on 10/12 but no adequate fluid pocket was found. Early on 10/13, broad coverage was initiated given unknown pathogen and unknown source of infection in an immunocompromised patient: ceftriaxone, flagyl, micafungin, and vancomycin. In addition, liver biopsy and paracentesis planned for 10/13 was held, immunosuppressive therapy was discontinued, and steroid dose was reduced. Repeat blood cultures (central line and peripheral) were drawn on 10/13 after initiating antibiotics and are pending. Both cultures from 10/12 have since speciated strep mitis.     The patient denies any recent international travel or contact with cats, dogs, or other animals before this admission. He has travelled out of state to [place] recently. He denies a history of frequent or unusual fungal infections. [name] Gross' care is unfortunately complicated by homelessness, food insecurity, and difficulty obtaining medications.     Allergies:   Bee pollen and Pollen extracts    Medications:    Current antibiotics:   Anti-infectives (From admission, onward)    Start     Dose/Rate Route Frequency Ordered Stop    08/23/19 2300  vancomycin dilution (VANCOCIN) 5 mg/mL injection 906 mg      15 mg/kg ?? 60.4 kg (Adjusted)  181.2 mL/hr over 60 Minutes Intravenous Every 8 hours 08/23/19 1525 08/30/19 0659    08/23/19 1000  micafungin (MYCAMINE) 150 mg in sodium chloride (NS) 0.9 % 100 mL IVPB      150 mg  125 mL/hr over 60 Minutes Intravenous Every 24 hours scheduled 08/23/19 0938      08/23/19 0200  metroNIDAZOLE (FLAGYL) IVPB 500 mg      500 mg  100 mL/hr over 60 Minutes Intravenous Every 8 hours 08/23/19 0121 08/30/19 0459    08/23/19 0200  cefTRIAXone (ROCEPHIN) 2 g in sodium chloride 0.9 % (NS) 100 mL IVPB-connector bag      2 g  200 mL/hr over 30 Minutes Intravenous Every 24 hours 08/23/19 0121 08/30/19 0559    08/22/19 2100  rifAXIMin (XIFAXAN) tablet 550 mg      550 mg Oral 2 times a day (standard) 08/22/19 1417 09/21/19 2059    08/15/19 0900  sulfamethoxazole-trimethoprim (BACTRIM) 400-80 mg tablet 80 mg of trimethoprim      1 tablet Oral Every Mon-Wed-Fri 08/13/19 0924 02/13/20 0859    08/12/19 1515  valGANciclovir (VALCYTE) tablet 450 mg      450 mg Oral Daily (standard) 08/12/19 1512 11/09/19 1659    08/12/19 1500  nystatin (MYCOSTATIN) oral suspension      1,000,000 Units Oral 3 times a day (standard) 08/12/19 1453 09/11/19 1359          Medical History:   Past Medical History:   Diagnosis Date   ??? 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 Depression    ??? Seasonal allergies    ???  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 Adherence difficult, takes 4/7 days.   -On chronic ppx: most recent regimen bactrim, acyclovir. Adherence difficult, takes 4/7 days.     Surgical History:   Past Surgical History:   Procedure Laterality Date   ??? FRENULECTOMY, LINGUAL     ??? PR ERCP REMOVE FOREIGN BODY/STENT BILIARY/PANC DUCT N/A 08/20/2017    Procedure: ENDOSCOPIC RETROGRADE CHOLANGIOPANCREATOGRAPHY (ERCP); W/ REMOVAL OF FOREIGN BODY/STENT FROM BILIARY/PANCREATIC DUCT(S);  Surgeon: Vonda Antigua, MD;  Location: GI PROCEDURES MEMORIAL [facility];  Service: Gastroenterology   ??? PR ERCP REMOVE FOREIGN BODY/STENT BILIARY/PANC DUCT N/A 06/04/2018    Procedure: ENDOSCOPIC RETROGRADE CHOLANGIOPANCREATOGRAPHY (ERCP); W/ REMOVAL OF FOREIGN BODY/STENT FROM BILIARY/PANCREATIC DUCT(S);  Surgeon: Chriss Driver, MD;  Location: GI PROCEDURES MEMORIAL [facility];  Service: Gastroenterology   ??? PR ERCP STENT PLACEMENT BILIARY/PANCREATIC DUCT N/A 11/21/2016    Procedure: ENDOSCOPIC RETROGRADE CHOLANGIOPANCREATOGRAPHY (ERCP); WITH PLACEMENT OF ENDOSCOPIC STENT INTO BILIARY OR PANCREATIC DUCT;  Surgeon: Vonda Antigua, MD;  Location: GI PROCEDURES MEMORIAL [facility];  Service: Gastroenterology   ??? PR ERCP,W/REMOVAL STONE,BIL/PANCR DUCTS N/A 08/20/2017    Procedure: ERCP; W/ENDOSCOPIC RETROGRADE REMOVAL OF CALCULUS/CALCULI FROM BILIARY &/OR PANCREATIC DUCTS;  Surgeon: Vonda Antigua, MD;  Location: GI PROCEDURES MEMORIAL [facility];  Service: Gastroenterology   ??? PR ERCP,W/REMOVAL STONE,BIL/PANCR DUCTS N/A 06/04/2018    Procedure: ERCP; W/ENDOSCOPIC RETROGRADE REMOVAL OF CALCULUS/CALCULI FROM BILIARY &/OR PANCREATIC DUCTS;  Surgeon: Chriss Driver, MD;  Location: GI PROCEDURES MEMORIAL [facility];  Service: Gastroenterology   ??? PR TRANSPLANT LIVER,ALLOTRANSPLANT N/A 09/22/2016    Procedure: LIVER ALLOTRANSPLANTATION; ORTHOTOPIC, PARTIAL OR WHOLE, FROM CADAVER OR LIVING DONOR, ANY AGE;  Surgeon: Doyce Loose, MD;  Location: MAIN OR [facility];  Service: Transplant   ??? PR TRANSPLANT,PREP DONOR LIVER, WHOLE N/A 09/22/2016    Procedure: [facility] STD PREP CAD DONOR WHOLE LIVER GFT PRIOR TNSPLNT,INC CHOLE,DISS/REM SURR TISSU WO TRISEG/LOBE SPLT;  Surgeon: Doyce Loose, MD;  Location: MAIN OR [facility];  Service: Transplant   ??? PR UPGI ENDOSCOPY W/US FN BX N/A 08/20/2017    Procedure: UGI W/ TRANSENDOSCOPIC ULTRASOUND GUIDED INTRAMURAL/TRANSMURAL FINE NEEDLE ASPIRATION/BIOPSY(S), ESOPHAGUS;  Surgeon: Vonda Antigua, MD;  Location: GI PROCEDURES [facility];  Service: Gastroenterology   ??? PR UPPER GI ENDOSCOPY,BIOPSY N/A 07/15/2016    Procedure: UGI ENDOSCOPY; WITH BIOPSY, SINGLE OR MULTIPLE;  Surgeon: Arnold Long Mir, MD;  Location: PEDS PROCEDURE ROOM [facility];  Service: Gastroenterology   ??? PR UPPER GI ENDOSCOPY,CTRL BLEED Left 05/05/2016    Procedure: UGI ENDOSCOPY; WITH CONTROL OF BLEEDING, ANY METHOD;  Surgeon: Arnold Long Mir, MD;  Location: PEDS PROCEDURE ROOM [facility];  Service: Gastroenterology   ??? PR UPPER GI ENDOSCOPY,CTRL BLEED N/A 05/12/2016    Procedure: UGI ENDOSCOPY; WITH CONTROL OF BLEEDING, ANY METHOD;  Surgeon: Annie Paras, MD;  Location: CHILDRENS OR [facility];  Service: Gastroenterology       Social History:   Tobacco Use: Never smoker  Alcohol Use: No alcohol use; reports recent accidental tequila consumption  Living situation: The patient is currently homeless and estranged from his immediate family. He has been staying with various friends over the past 5 months. He also has marked food insecurity.   [place] travel: None  International travel: None  Pets and animal exposure: None  Insect exposure: None  TB exposures: Unknown    Other significant  exposures: None specified. Does not report time in homeless shelters.     Family History:  Family History   Problem Relation Age of Onset   ??? Diabetes Maternal Grandmother    ??? Diabetes Paternal Grandmother    ??? Hypertension Maternal Grandfather    ??? Hypertension Paternal Grandfather    ??? Asthma Brother    ??? Anesthesia problems Neg Hx      Immunizations:  Immunizations reviewed and up to date.    Review of Systems:  All other systems reviewed are negative.    Objective:     Vital signs last 24 hours: Temp:  [36.6 ??C (97.9 ??F)-37.3 ??C (99.1 ??F)] 36.8 ??C (98.2 ??F)  Heart Rate:  [77-105] 88  SpO2 Pulse:  [104] 104  Resp:  [20-30] 22  BP: (97-136)/(51-87) 136/87  MAP (mmHg): [66-102] 102  SpO2:  [98 %-100 %] 99 %     Physical Exam:  Constitutional: Laying in bed, alert, interactive, pleasant.  Head: Normocephalic atraumatic  Eyes: Icteric sclera, no discharge  Ears/Nose/Mouth/Throat: no nasal discharge, no intraoral lesions or plaques  Neck: No masses or tenderness, supple, no LAD   Respiratory: Clear to auscultation, good air entry in all fields. No wheezing, crackles or rhonchi, breathing unlabored.   Cardiovascular: Regular rate and rhythm, normal S1/S2, no murmurs appreciated. No edema, peripheral perfusion intact.   Gastrointestinal: Distended, taut, no fluid wave elicited. Tenderness to deeper palpation of LUQ and RUQ.   Neurologic: Alert, oriented. Speech is halting, unsure if this is baseline. CN II-XII grossly intact. Moderate tremor of hands. Sensation, motor intact.   Musculoskeletal: Extremities warm and well-perfused, no edema, moves all extremities equally.   Skin: Significant jaundice throughout. 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   Psychiatric: Appropriate for age, pleasant, interactive.     Relevant Test Results:   Labs:  WBC 9.6 (down 17.3 on 10/12)  87% neuts, ANC 8400, ALC 0.7  Hgb 8.9, plt 58  Na 138, K 4.0, Cl 110, BUN 26, Cr 0.8  Ca 7.9, Mg 1.6, Phos 2.3   Alb 2.3  Tbili 25.6, dbili 22.1  AST 154, AP 675, GGT 459   PT/INR 15.7/1.36     Microbiology:    Culture results reviewed:  10/12 bcx central line: GPC in chains, strep mitis +  10/12 bcx peripheral: GPC in chains, strep mitis +  10/13 bcx peripheral: pending  10/13 bcx central line: pending     Imaging:   MRI abdomen 10/9: The central hepatic bile ducts appear slightly prominent and this slightly increased compared to prior. Otherwise no overt dilatation of the biliary ductal system. The common bile duct is normal caliber. No obvious stenosis or stricture is identified. No obvious bile duct wall enhancement is seen. Heterogeneous enhancement of the liver. There is abnormal mildly high T2 signal along the portal triads. These findings are not specific but could be secondary to hepatocellular injury including rejection, acute or chronic liver disease or cholangitis. Porta hepatis lymph nodes measuring up to 1.0 cm are also identified (series 15 image 40) prominent portacaval lymph nodes are also seen. These are likely reactive. Unchanged dilatation of the right and left portal vein which is more prominent on the right side measuring up to 2.2 cm. Large volume ascites. Mild wall thickening of small bowel loops. This is not specific and could be secondary to edema/ascites. Subcutaneous questionable emphysema in the right flank, likely from recent liver biopsy. Anasarca.  CXR 10/12: New right basilar atelectasis versus airspace  disease and trace right pleural effusion.    [name] Gross is being seen in consultation at the request of Ciro Backer,* for evaluation of strep mitis bacteremia in the setting of acute liver transplant rejection/failure.   I personally spent 110 minutes on the floor or unit in direct patient care. The direct patient care time included face-to-face time with the patient, reviewing the patient's chart, communicating with the family and/or other professionals and coordinating care. Greater than 50% of this time was spent in counseling or coordinating care with the patient regarding strep mitis bacteremia.

## 2019-08-23 NOTE — Unmapped (Signed)
ADVANCE CARE PLANNING NOTE    Discussion Date: August 23, 2019    Patient has decisional capacity: Yes    Patient has selected a Health Care Decision-Maker if loses capacity: Yes    Health Care Decision Maker as of 08/23/2019    HCDM (patient stated preference): Stephenie Acres - Friend - [phone]    HCDM (patient stated preference): Marja Kays - [phone]    Discussion Participants:  Sheran Spine, Patient  Winfred Redel Lindwood Qua, PNP  Mariah Goldfield, [place]  Tery Sanfilippo, [place]    Communication of Medical Status/Prognosis:   Based on previous conversation with primary GI specialist (Dr. Melrose Nakayama) on 10/9, Octavio is aware his liver is very sick and that the team is trying to reverse the injury.  Complications of liver disease, including encephalopathy, infection, and bleeding have been discussed.  His prognosis is guarded given severity of liver disease and potential for lack of response to salvage therapies, AKI, and complex social situation with lack of caregiver support.    Communication of Treatment Goals/Options:   Abenezer is aware that Thymo was successful in the past and may be again, but may not be.  He had positive blood cultures (peripheral??and??central) on 10/12 which has interrupted several aspects of his current treatment as he is treated for bacteremia; liver biopsy and paracentesis canceled, immunosuppression therapy put on hold.    Treatment Decisions:   Derrien has wondered if he could have another transplant but knows this isn't necessarily an option. Up to present, he has not engaged in much conversation about how we would know.  Additionally, he has not discussed alternatives to transplant (e.g. what life may look) when prompted.     Though Toshio remains unsure of his wishes (currently working through [employer]), he has consistently named his friend Stephenie Acres as his preferred/primary HCDM, and this week he has decided that he would also like his maternal grandfather Liana Gerold to be his backup/secondary HCDM.  We discussed making this official in the [facility] system, which was done today per Teigan' wishes.  He was informed  that more steps would need to be taken (such as getting a notary and two witnesses) to have a legal HCPOA document that would be honored at other institutions if needed.    Shaughn reported he had spoken to [place] about how seriously ill he was, and [name] agreed to be his Runner, broadcasting/film/video.  Although Mccoy has not communicated his wishes to [name] (still deciding what he wants for himself), he trusts the decisions [name] would make for him.  Sharlet Salina was named as a Runner, broadcasting/film/video but has no current knowledge of Lenzy' condition.     I spent 30 providing voluntary advance care planning services for this patient.    Jennette Dubin, [place], CPNP  Children's Supportive Care Team  [phone] (pager)

## 2019-08-23 NOTE — Unmapped (Signed)
**  This patient was not seen in person today. The Endocrinology service has moved to a virtual model to minimize potential spread of COVID-19, protect patients/providers and reduced PPE utilization.  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    His hyperglycemia is being driven by lack of appropriate insulin administration. He needs insulin each time he eats, even if it is not at the scheduled time of insulin administration. In addition, he should always get correctional insulin, even if NPO  ??  Plan  -??Increase lantus to 10U nightly, half dose if NPO  -??Continue??4units TIDAC lispro with meals??  -??Can add??PRN dose for overnight meal: if a normal sized meal give 4 units, if smaller give 2 units lispro  - Continue sensitive sliding scale QACHS  -??I would recommend sugar free drinks or water to meet his fluid goal. Encouraged decrease in sugar drinks.??  ??  Discussed with Dr. Marcello Fennel??  ??  Primary team informed of recommendations. We will continue to follow patient as needed. Please call/page [phone] with questions or concerns.   ??  T. Gillie Manners, MD  [facility] Endocrinology Fellow     I was immediately available via phone/pager or present on site.  I reviewed and discussed the case with the resident, but did not see the patient.  I agree with the assessment and plan as documented in the resident's note.     Maximino Greenland, MD, MPH  Attending - Endocrinology, Diabetes, and Metabolism    Time spent reviewing chart 10 minutes  Time spent with fellow 10 minutes      Subjective/24 hour events:    Sugars ranging 200-300. Was NPO last night for procedure. Did not get meal insulin last night or correctional insulin this AM.   Has GPC in chains growing in blood.     Objective: :  BP 100/59  - Pulse 85  - Temp 36.6 ??C (Temporal)  - Resp 24  - Ht 174.6 cm (5' 8.74)  - Wt 60.4 kg (133 lb 2.5 oz)  - SpO2 99%  - BMI 19.81 kg/m??         Test Results    Lab Results   Component Value Date    POCGLU 315 (H) 08/22/2019    POCGLU 202 (H) 08/22/2019    POCGLU 214 (H) 08/22/2019    POCGLU 238 (H) 08/21/2019    POCGLU 221 (H) 08/21/2019     Lab Results   Component Value Date    CREATININE 0.80 08/23/2019    NA 138 08/23/2019    K 4.0 08/23/2019    CL 110 (H) 08/23/2019    CO2  08/23/2019      Comment:      Specimen icteric.      ANIONGAP  08/23/2019      Comment:      Unable to calculate      CALCIUM 7.9 (L) 08/23/2019    ALBUMIN 2.3 (L) 08/23/2019

## 2019-08-23 NOTE — Unmapped (Addendum)
Pediatric Vancomycin Therapeutic Monitoring Pharmacy Note    [name] Gross is a 18 y.o. male starting vancomycin. Date of therapy initiation: 08/23/19    Indication: Central line associated bloodstream infection (+ cultures showing gram + cocci in chains, suspicious of strep vs staph )    Prior Dosing Information: None/new initiation     Dosing Weight: 59.5 kg    Goals:  Therapeutic Drug Levels  Vancomycin trough goal: 10-15 mg/L    Additional Clinical Monitoring/Outcomes  Renal function, volume status (intake and output)    Results: Not applicable    Wt Readings from Last 1 Encounters:   08/22/19 59.5 kg (131 lb 2.8 oz) (14 %, Z= -1.08)*     * Growth percentiles are based on CDC (Boys, 2-20 Years) data.     Creatinine   Date Value Ref Range Status   08/22/2019 0.79 0.70 - 1.30 mg/dL Final   84/69/6295 2.84 0.70 - 1.30 mg/dL Final   13/24/4010 2.72 0.70 - 1.30 mg/dL Final        Pharmacokinetic Considerations and Significant Drug Interactions:  ? Dosed per pediatric guideline  ? Concurrent nephrotoxic meds: not applicable and Bactrim, valgancyclovir, tacro (increase in creatinine)    Assessment/Plan:  Recommendation(s)  ? Start vancomycin 892.5 mg (15 mg/kg) IV every 8 hours    Follow-up  ? Next level ordered: prior to 4th scheduled dose on 08/24/19 @ 0300  ? A pharmacist will continue to monitor and order levels as appropriate    Please page service pharmacist with questions/clarifications.    Vanita Panda, PharmD

## 2019-08-23 NOTE — Unmapped (Signed)
[facility]  Follow-Up Psychiatry Consult Note     Service Date: August 24, 2019  LOS:  LOS: 20 days      Assessment:   [name] Gross is a 18 y.o. male with pertinent past medical and psychiatric diagnoses of primary sclerosing cholangitis s/p liver transplant in 2017 with multiple threats of rejection, and depression, anxiety, and suicide attempt (x2) admitted 08/04/2019  3:39 AM for black stools, abdominal pain, and icteric sclera most likely due to acute liver transplant rejection/failure.  Patient was seen in consultation by Psychiatry at the request of Melba Coon, MD with [facility]) for evaluation of Depression. Today he is being seen in follow-up for this issue, at patient request.     The patient's current presentation of depressed mood, poor sleep, poor appetite, feelings of isolation, and nightmares is most consistent with a known diagnosis of majory depressive disorder and likely exacerbated by current primary medical illness. Patient has a history of intentional overdose on fluoxetine in the past, then more recently on tamiflu, prednisone, ibuprofen, and vitamin D. He was hospitalized on the Adolescent Psychiatry unit at this center for the latter attempt, and discharged 03/03/18 on Lexapro 20 mg, melatonin 9 mg for depression and insomnia. He reports mental health care has been unhelpful in the past and he is not interested in medication for depression or therapy- he has seen Psychology previously as inpatient. He has not engaged with mental health resources or taken medication for depression or insomnia in 8-10 months.     Today, he reports interest in establishing therapy care, to work through his difficult medical diagnoses and family situation, as well as pastoral care. We will work to connect patient with appropriate therapy resources. Our Psychiatry staff remains available as needed for continued support.     Please see below for detailed recommendations. Diagnoses:   Active Hospital problems:  Principal Problem:    Liver transplant rejection (CMS-HCC)  Active Problems:    Recurrent major depressive disorder, in partial remission (CMS-HCC)    Malnutrition of mild degree (CMS-HCC)    History of liver transplant (CMS-HCC)    Sickle cell trait (CMS-HCC)    Transaminitis    Anemia    Hypoalbuminemia    Abdominal pain    Homeless    Dark stools     Problems edited/added by me:  No problems updated.    Safety Risk Assessment:  A full risk assessment was previously performed on 9/24.  Risk assessment remains essentially unchanged. Patient remains not at acutely elevated risk.     Recommendations:   ## Safety:   -- No acute safey concerns.  Please see safety assessment for further discussion.  -- If pt attempts to leave against medical advice and it is felt to be unsafe for them to leave, please call a Behavioral Response and page Psychiatry at [phone].    ## Medications:   -- Defer initiation of SSRI or other antidepressant as patient is not interested at this time  -- Melatonin 3 mg q1800 for sleep and circadian rhythm regulation.    ## Therapeutic Support: Will evaluate available inpatient resources for consistent therapy care, as unfortunately the adult psychiatry service does not have therapists on staff  -- Recommend pastoral care    ## Medical Decision Making Capacity:   -- A formal capacity assessement was not performed as a part of this evaluation.  If specific capacity questions arise, please contact our team as below.     ##  Further Work-up:   -- No recommendations at this time.    ## Disposition:   -- There are no psychiatric contraindications to discharging this patient when medically appropriate.  -- We will continue to encourage patient to consider outpatient mental health care    ## Behavioral / Environmental:   -- No specific recommendations at this time.    Thank you for this consult request. Recommendations have been communicated to the primary team.  We will follow as needed at this time. Please page [phone] or [phone] (after hours)  for any questions or concerns.     I saw the patient in person or via face-to-face video conference.    Discussed with  Attending, Breck Coons, MD, who agrees with the assessment and plan.    Nichola Sizer, MD      I was available. I discussed the resident's findings, assessment and plan. I edited and agree with the above as documented by the resident.    Cloria Spring, MD          Interval History:     Updates to hospital course since last seen by psychiatry: Pt has been seen by palliative care and SW to discuss care planning and his desire for autonomy vs need for supportive caregivers. Liver biopsy planned for 10/13, but cancelled due to bacteremia noted overnight. Stopped immunosuppression to start broad spectrum IV antibiotics.    Patient Interview: The patient was seen in person by the resident.    Patient reports mood is good, but that his body isn't cooperating with treatment as hard as he tries. Lost 2 IV's overnight, was in constant pain, has new infection. Designated maternal grandfather, friend [name] Gross as Research officer, political party overnight. Discussed ongoing family difficulty, anger and sadness as mother kicked pt out of the house. Discussed opportunities for small pleasures in the hospital and to provide mental breaks from acute concerns. Would love to go outside, order food via [employer], see chaplain services (endorses interest in Buddhism). Therapeutic support provided, no acute safety concerns identified.     Relevant Updates to past psychiatric, medical/surgical, family, or social history: N/A    Collateral:   - Reviewed medical records in Epic  - Primary team: Discussion with transplant that he may not be a candidate for re-transplant in the future. Has been considering surrogate decision makers.     ROS:   All systems reviewed as negative/unremarkable aside from the following pertinent positives and negatives: no acute pain or discomfort, fair appetite.     Current Medications:  Scheduled Meds:  ??? calcium carbonate  300 mg elem calcium Oral BID   ??? cefTRIAXone  2 g Intravenous Q24H   ??? ergocalciferol  50,000 Units Oral Weekly   ??? flu vacc [id number] 6mos up(PF)  0.5 mL Intramuscular During hospitalization   ??? insulin glargine  10 Units Subcutaneous Nightly   ??? insulin lispro  0-5 Units Subcutaneous ACHS   ??? insulin lispro  4 Units Subcutaneous TID AC   ??? melatonin  6 mg Oral QPM   ??? methylPREDNISolone sodium succinate  100 mg Intravenous Once   ??? metronidazole  500 mg Intravenous Q8H   ??? micafungin  150 mg Intravenous Q24H SCH   ??? multivitamin, with zinc  2 tablet Oral Daily   ??? mupirocin   Topical TID   ??? nystatin  1,000,000 Units Oral TID   ??? ondansetron  4 mg Intravenous Once   ??? pantoprazole  40 mg Oral Daily   ??? [  START ON 08/25/2019] phytonadione (AQUA-MEPHYTON) intravenous  7.5 mg Intravenous Daily   ??? rifAXIMin  550 mg Oral BID   ??? sulfamethoxazole-trimethoprim  1 tablet Oral Q MWF   ??? valGANciclovir  450 mg Oral Daily   ??? vancomycin dilution  15 mg/kg (Adjusted) Intravenous Q8H     Continuous Infusions:  ??? sodium chloride       PRN Meds:.dextrose 50 % in water (D50W), heparin, porcine (PF), insulin lispro, ondansetron, polyethylen glycol      Objective:   Vital signs:   Temp:  [36.5 ??C-37.1 ??C] 36.5 ??C  Heart Rate:  [81-102] 102  SpO2 Pulse:  [84-89] 84  Resp:  [20-24] 20  BP: (118-136)/(71-87) 118/71  MAP (mmHg):  [86-102] 86  SpO2:  [99 %-100 %] 100 %    Physical Exam:  Gen: No acute distress.  Pulm: Normal work of breathing.  Neuro/MSK: Reduced muscle bulk and tone, thin appearing, gait deferred.  Skin: not assessed.     Mental Status Exam:  Appearance:  appears stated age, well-developed, unkept, lying in bed, ill-appearing and poorly nourished   Attitude:   calm, cooperative and polite   Behavior/Psychomotor:  appropriate eye contact and no abnormal movements   Speech/Language:   normal rate, volume, tone, fluency and language intact, well formed   Mood:  ???good???   Affect:  constricted, dysthymic, sad and briefly tearful   Thought process:  logical, linear, clear, coherent, goal directed   Thought content:    Does not endorse thoughts of self-harm, SI, plans, or intent. Denies HI.  No grandiose, self-referential, persecutory, or paranoid delusions noted.   Perceptual disturbances:   Does not endorse auditory and visual hallucinations and behavior not concerning for response to internal stimuli   Attention:  able to fully attend without fluctuations in consciousness   Concentration:  Able to fully concentrate and attend   Orientation:  grossly oriented.   Memory:  not formally tested, but grossly intact   Fund of knowledge:   not formally assessed   Insight:    Impaired   Judgment:   Impaired   Impulse Control:  Fair     Data Reviewed:  I reviewed labs from the last 24 hours.  I obtained history from the collateral sources noted above in the history.    Additional Psychometric Testing:  Not applicable.

## 2019-08-23 NOTE — Unmapped (Signed)
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 Patient has discussed prognosis with Dr. Melrose Nakayama and palliative care (10/9). Patient understands that transplant is and option but that he may not be a candidate.We will continue to discuss his options with him. His active issues are a tenuous fluid balance, balancing intravascular volume and edema. He has ascites and now with pitting edema on exam. We will continue to encouraging exclusive PO intake, and may consider further albumin infusion for hypoalbuminemia in the future. He has steroid-induced hyperglycemia, for which we have consulted adult Endocrine and started meal coverage and sliding scale lispro and will continue to monitor his sugars. His R ear and L posterior neck lesions are improving with Mupirocin.  On 10/12 patient developed leukocytosis concerning for infection.  Bedside paracentesis attempted due to c/f SBP but no sufficient fluid pocket was noted.  Infectious workup revealed blood cultures positive for GPC's, and empiric antibiotics were started overnight.  As a result of bacteremia, liver biopsy had to be canceled.  We are now stopping immunosuppression therapy, and treating with Pat spectrum antibiotics.    Hx Liver Transplant - Subacute/Acute Liver Failure: ??Pt has biopsy confirmed severe acute rejection (9/25), RAI 9/9. Labs today essentially unchanged w/out robust response to Thymo, however, overall trend of AST and alk phos during this admission has been downward a bit. Per tranplant team recs, liver biopsy was planned for today with paracentesis in conjunction, however patient became bacteremic overnight which precluded liver biopsy.  We will stop immunosuppressive therapy today, taper down steroid dose and start empiric antibiotics as below.   - S/p 12 days of Thymoglobulin with high dose MethylPrednisolone (10/1-10/12) with poor overall repsonse to therapy    - Today, will taper steroids from 500 mg to 100 mg daily   - DC Tacro and Mycophenolate  - Continue Valgancyclovir 450mg  daily (3months) and Bactrim MWF (59mo) for prophylaxis (10/2-)  - Continue on Nystatin 10ml TID (9mo) prophylaxis (10/2-)  - PT/INR every other day, giving vit K  - Protonix 40 mg PO daily    GPC Bacteremia  Developed sudden leukocytosis with white count elevated to 17.3 10/12, afebrile, non-toxic appearing, without localizing symptoms.  Differential included SBP or other source of infection.  Lung sounds noted to right lower lobe but chest x-ray showed only atelectasis.  UA unremarkable. blood culture 2/2 positive for GPC's in chains overnight 10/12 -10/13, started on empiric broad-spectrum antibiotics.  Unknown source of infection.  Will repeat blood cultures today and monitor for clearance.  Will consult ID and consider pulling a PICC line.  Differential includes infections of unknown source including  SBP. Patient has no localizing symptoms of infection and no fever.  Will complete infectious work-up today.  Lung sounds diminished to right lower lobe, so will include chest x-ray in work-up today.  - Blood culture x2  -Consult ID, appreciate recs  -Consider pulling PICC line.    Edema, ascites  Ascites seems to have worsened over the past week.  Noted as large volume ascites on MRCP.  Concerned that patient could easily become fluid overloaded, so monitoring fluid status closely.  - Monitor fluid status and daily weights  - Encouraging PO intake (>1.6L of fluids PO/day)   - STRICT I/Os      Nausea/emesis  - Zofran??q8 hours PO PRN    Nutrition  - Regular diet  - Glucerna or Ensure supplements   - Ergocalciferol qMon   - aquadex multivitamin    - Daily CMP, Mg, Ph    Hyperglycemia   - Lantus 10 U nightly; will half if being made NPO   - Continue 4 units TIDAC lispro w/meals  - PRN dose for overnight meal; if a normal sized meal give 4 units, if smaller meal give 2 units  - continue sensitive lispro sliding scale with meals and in the evenings  - glucose checks qAC (including nighttime meal)    Lesion on Right Ear and Left posterior Neck, culture +staph aureus   Lesions healing well.  -Mupirocin ointment for both lesions until healed, per derm rec  ??  Major Depressive Disorder, insomnia: Stable. Has hx of attempted overdoses, with the last attempt in April 2019??at which point he was hospitalized in the Adolescent Psychiatry unit and discharged on Lexapro 20 mg. ??He has not engaged with mental health resources or taken medication for depression or insomnia in 8-10 months.????Denies SI, passive suicidality, self-harm, or HI at this time.  Patient has been in good spirits, however today his mood is frustrated after liver biopsy was canceled this morning.  - Patient asking to psychiatry team again today.  - melatonin 5 mg 1 hour before bedtime    Constipation  -Continue Miralax 17 g BID PRN     Deconditioning  Patient endorses feeling weak and having difficulty in moving around.  He has been working hard to get out of bed and do exercises in his room.  -PT following  -Encourage OOB and ambulation  ??  Severe Malnutrition:????Prior to admission, patient has been skipping meals due to poor appetite and food insecurity. Albumin persistently low in setting of liver disease.  - Nutrition consult, appreciate recs  - Thiamine   - Multivitamin, Ca 300 mg BID, vit D 1000u QD   - Monitor phosphorous level    Homelessness - Social Concerns:??Sevin has experienced homelessness for ~5 months when he left his mother's house. ??He has severed all ties with his family and states his relationship with his mother cannot be reconciled. ??He also requested contact information of family members to be erased from his chart (this has been completed, but social worker did drop contact info into her note on 9/24 in case he changes his mind during admission). ??In addition to food insecurity, he has not been able to secure medications. ??He is routinely exposed to alcohol and drugs (marijuana, cocaine, etc.) second-hand through his friendships though he denies current use himself. ??On urine drug screen, he tested positive for marijuana only.  -Supportive care following. Would like Pancho to work on naming a Runner, broadcasting/film/video in the event he cannot make decisions for himself. He  has identified a few friends as possibilities.   - 10/13: Patient voiced that he may reach out to his grandmother and grandfather today  ??  Access:??PIV, PICC (placed 9/28)  ??  Discharge criteria:??liver transplant work-up and treatment of acute rejection    Subjective:     Interval History:   Patient sitting up, eating her to eat breakfast this morning.  States he feels well.  However, he developed leukocytosis this morning concerning for infection and potentially SBP.      Objective:     Vital signs in last 24 hours:  Temp:  [36.6 ??C-37.3 ??C] 36.6 ??C  Heart Rate:  [77-105] 85  SpO2 Pulse:  [104] 104  Resp:  [20-30] 24  BP: (97-120)/(51-80) 100/59  MAP (mmHg):  [66-91] 72  SpO2:  [98 %-100 %] 99 %  Intake/Output last 3 shifts:  I/O last 3 completed shifts:  In: 1931.4 [P.O.:720; I.V.:1211.4]  Out: 4150 [Urine:4150]    Physical Exam:  General:   Laying in bed, awake, very thin and weak appearing  Head: normocephalic, atraumatic.  Eyes: EOMI. Scleral icterus present.   Nose:   clear, no discharge  Lungs: clear breath sounds bilaterally. No increased work of breathing. No wheezes/crackles.  Heart: regular rate and rhythm. No murmurs appreciated on auscultation.  Abdomen:  Abdomen soft, distended, soft, TTP in RUQ and RLQ. Ascites present  Neuro: deconditioned, moves all extremities with decreased strength  Extremities:. Pitting edema in the LE, [facility]  Skin:Skin lesion present on right ear and left neck, healing well. Jaundiced throughout. Acne present on face, back, chest    Active Medications reviewed and KEY Medications include:     Current Facility-Administered Medications:   ???  albumin human 5 % bottle 25 g, 25 g, Intravenous, Once, David Stall, MD  ???  calcium carbonate (OS-CAL) tablet 300 mg elem calcium, 300 mg elem calcium, Oral, BID, David Stall, MD, 300 mg elem calcium at 08/23/19 1258  ???  cefTRIAXone (ROCEPHIN) 2 g in sodium chloride 0.9 % (NS) 100 mL IVPB-connector bag, 2 g, Intravenous, Q24H, David Stall, MD, Last Rate: 200 mL/hr at 08/23/19 0549, 2 g at 08/23/19 0549  ???  dextrose 50 % in water (D50W) 50 % solution 12.5 g, 12.5 g, Intravenous, Q10 Min PRN, David Stall, MD  ???  Implement, , , Until Discontinued **AND** Care order/instruction, , , PRN **AND** Vital signs, , , PRN **AND** Nursing oxygen orders / instructions, , , PRN **AND** sodium chloride (NS) 0.9 % infusion, 20 mL/hr, Intravenous, Continuous PRN, Stopped at 08/19/19 0700 **AND** sodium chloride 0.9% (NS) bolus 1,000 mL, 1,000 mL, Intravenous, Daily PRN **AND** diphenhydrAMINE (BENADRYL) injection 25 mg, 25 mg, Intravenous, Q4H PRN **AND** famotidine (PEPCID) injection 20 mg, 20 mg, Intravenous, Q4H PRN **AND** methylPREDNISolone sodium succinate (PF) (Solu-MEDROL) injection 125 mg, 125 mg, Intravenous, Q4H PRN **AND** EPINEPHrine (EPIPEN) injection 0.3 mg, 0.3 mg, Intramuscular, Daily PRN, David Stall, MD  ???  ergocalciferol (DRISDOL) capsule 50,000 Units, 50,000 Units, Oral, Weekly, David Stall, MD, 50,000 Units at 08/22/19 0848  ???  furosemide (LASIX) injection 40 mg, 40 mg, Intravenous, Once, David Stall, MD  ???  heparin preservative-free injection 10 units/mL syringe (HEPARIN LOCK FLUSH), 20 Units, Intravenous, Daily PRN, David Stall, MD, 20 Units at 08/22/19 0925  ???  influenza vaccine quad (FLUARIX, FLULAVAL, FLUZONE) (6 MOS & UP) 2020-21, 0.5 mL, Intramuscular, During hospitalization, David Stall, MD  ???  insulin glargine (LANTUS) injection  10 Units, 10 Units, Subcutaneous, Nightly, David Stall, MD  ???  insulin lispro (HumaLOG) injection 0-5 Units, 0-5 Units, Subcutaneous, ACHS, David Stall, MD, 4 Units at 08/22/19 2144  ???  insulin lispro (HumaLOG) injection 2-4 Units, 2-4 Units, Subcutaneous, Nightly PRN, David Stall, MD  ???  insulin lispro (HumaLOG) injection 4 Units, 4 Units, Subcutaneous, TID AC, David Stall, MD, 4 Units at 08/22/19 1459  ???  melatonin tablet 6 mg, 6 mg, Oral, QPM, David Stall, MD, 6 mg at 08/22/19 2156  ???  methylPREDNISolone sodium succinate (PF) (Solu-MEDROL) injection 100 mg, 100 mg, Intravenous, Once, Sallyanne Havers, MD  ???  metroNIDAZOLE (FLAGYL) IVPB 500 mg, 500 mg, Intravenous, Q8H, David Stall, MD, Last Rate: 100 mL/hr at 08/23/19 0438, 500 mg at 08/23/19 0438  ???  micafungin (MYCAMINE) 150 mg in sodium chloride (NS) 0.9 % 100 mL IVPB, 150 mg, Intravenous, Q24H SCH, David Stall, MD  ???  multivitamin, with zinc (AQUADEKS) chewable tablet, 2 tablet, Oral, Daily, David Stall, MD, 2 tablet at 08/23/19 1259  ???  mupirocin (BACTROBAN) 2 % ointment, , Topical, TID, David Stall, MD  ???  nystatin (MYCOSTATIN) oral suspension, 1,000,000 Units, Oral, TID, David Stall, MD, 1,000,000 Units at 08/22/19 2100  ???  ondansetron (ZOFRAN) injection 4 mg, 4 mg, Intravenous, Once, David Stall, MD  ???  ondansetron (ZOFRAN-ODT) disintegrating tablet 4 mg, 4 mg, Oral, Q8H PRN, David Stall, MD  ???  pantoprazole (PROTONIX) EC tablet 40 mg, 40 mg, Oral, Daily, David Stall, MD, 40 mg at 08/22/19 2235  ???  phytonadione (vitamin K1) (AQUA-MEPHYTON) 7.5 mg in sodium chloride (NS) 0.9 % 50 mL IVPB, 7.5 mg, Intravenous, Daily, David Stall, MD, Last Rate: 173.3 mL/hr at 08/22/19 1803, 7.5 mg at 08/22/19 1803  ???  polyethylene glycol (MIRALAX) packet 17 g, 17 g, Oral, BID PRN, David Stall, MD  ???  rifAXIMin (XIFAXAN) tablet 550 mg, 550 mg, Oral, BID, David Stall, MD, 550 mg at 08/23/19 1300  ???  sodium chloride (NS) 0.9 % infusion, , Intravenous, Continuous, David Stall, MD  ???  sulfamethoxazole-trimethoprim (BACTRIM) 400-80 mg tablet 80 mg of trimethoprim, 1 tablet, Oral, Q MWF, David Stall, MD, 80 mg of trimethoprim at 08/22/19 0849  ???  valGANciclovir (VALCYTE) tablet 450 mg, 450 mg, Oral, Daily, David Stall, MD, 450 mg at 08/22/19 1708  ???  vancomycin dilution (VANCOCIN) 10 mg/mL (CENTRAL LINE) injection 892.5 mg, 15 mg/kg, Intravenous, Q8H, David Stall, MD, 892.5 mg at 08/23/19 1610        Studies: Personally reviewed and interpreted.  Labs/Studies:  Labs and Studies from the last 24hrs per EMR and Reviewed and   All lab results last 24 hours:    Recent Results (from the past 24 hour(s))   POCT Glucose    Collection Time: 08/22/19  2:58 PM   Result Value Ref Range    Glucose, POC 202 (H) 70 - 179 mg/dL   Urinalysis    Collection Time: 08/22/19  6:56 PM   Result Value Ref Range    Color, UA Amber     Clarity, UA Clear     Specific Gravity, UA 1.014 1.003 - 1.030    pH, UA 6.0 5.0 - 9.0 Leukocyte Esterase, UA Negative Negative    Nitrite, UA Negative Negative    Protein, UA Negative Negative    Glucose, UA >/=500 mg/dL (A) Negative  Ketones, UA Negative Negative    Urobilinogen, UA 4.0 mg/dL (A) 0.2 mg/dL, 1.0 mg/dL    Bilirubin, UA Large (A) Negative    Blood, UA Negative Negative    RBC, UA <1 <=3 /HPF    WBC, UA 1 <=2 /HPF    Squam Epithel, UA <1 0 - 5 /HPF    Bacteria, UA Rare (A) None Seen /HPF    Amorphous Crystal, UA Rare /HPF    Mucus, UA Rare (A) None Seen /HPF   POCT Glucose    Collection Time: 08/22/19  9:30 PM   Result Value Ref Range    Glucose, POC 315 (H) 70 - 179 mg/dL   Comprehensive Metabolic Panel    Collection Time: 08/23/19  5:51 AM   Result Value Ref Range    Sodium 138 135 - 145 mmol/L    Potassium 4.0 3.5 - 5.0 mmol/L    Chloride 110 (H) 98 - 107 mmol/L    Anion Gap      CO2      BUN 26 (H) 7 - 21 mg/dL    Creatinine 1.61 0.96 - 1.30 mg/dL    BUN/Creatinine Ratio 33     EGFR CKD-EPI Non-African American, Male >90 >=60 mL/min/1.62m2    EGFR CKD-EPI African American, Male >90 >=60 mL/min/1.6m2    Glucose 303 (H) 70 - 99 mg/dL    Calcium 7.9 (L) 8.5 - 10.2 mg/dL    Albumin 2.3 (L) 3.5 - 5.0 g/dL    Total Protein      Total Bilirubin 25.6 (H) 0.0 - 1.2 mg/dL    AST 045 (H) 19 - 55 U/L    ALT      Alkaline Phosphatase 675 (H) 65 - 260 U/L   Gamma GT (GGT)    Collection Time: 08/23/19  5:51 AM   Result Value Ref Range    GGT 459 (H) 12 - 109 U/L   Magnesium Level    Collection Time: 08/23/19  5:51 AM   Result Value Ref Range    Magnesium 1.6 1.6 - 2.2 mg/dL   Phosphorus Level    Collection Time: 08/23/19  5:51 AM   Result Value Ref Range    Phosphorus 3.0 2.9 - 4.7 mg/dL   CBC w/ Differential    Collection Time: 08/23/19  5:51 AM   Result Value Ref Range    WBC 9.6 4.5 - 11.0 10*9/L    RBC 3.25 (L) 4.50 - 5.90 10*12/L    HGB 8.9 (L) 13.5 - 17.5 g/dL    HCT 40.9 (L) 81.1 - 53.0 %    MCV 81.7 80.0 - 100.0 fL    MCH 27.2 26.0 - 34.0 pg    MCHC 33.3 31.0 - 37.0 g/dL    RDW 91.4 (H) 78.2 - 15.0 %    MPV 7.5 7.0 - 10.0 fL    Platelet 58 (L) 150 - 440 10*9/L    Neutrophils % 87.0 %    Lymphocytes % 6.9 %    Monocytes % 2.5 %    Eosinophils % 0.2 %    Basophils % 0.0 %    Neutrophil Left Shift 1+ (A) Not Present    Absolute Neutrophils 8.4 (H) 2.0 - 7.5 10*9/L    Absolute Lymphocytes 0.7 (L) 1.5 - 5.0 10*9/L    Absolute Monocytes 0.2 0.2 - 0.8 10*9/L    Absolute Eosinophils 0.0 0.0 - 0.4 10*9/L    Absolute Basophils 0.0 0.0 - 0.1 10*9/L  Large Unstained Cells 3 0 - 4 %    Microcytosis Marked (A) Not Present    Macrocytosis Slight (A) Not Present    Anisocytosis Marked (A) Not Present    Hypochromasia Slight (A) Not Present   Bilirubin, Direct    Collection Time: 08/23/19  5:51 AM   Result Value Ref Range    Bilirubin, Direct 22.10 (H) 0.00 - 0.40 mg/dL   PT-INR    Collection Time: 08/23/19  1:25 PM   Result Value Ref Range    PT 15.7 (H) 10.2 - 13.1 sec    INR 1.36      ========================================      Harlow Mares, MD  Med/Peds, PGY-1  [phone]

## 2019-08-24 LAB — CBC W/ AUTO DIFF
BASOPHILS ABSOLUTE COUNT: 0 10*9/L (ref 0.0–0.1)
BASOPHILS RELATIVE PERCENT: 0.1 %
EOSINOPHILS ABSOLUTE COUNT: 0 10*9/L (ref 0.0–0.4)
EOSINOPHILS RELATIVE PERCENT: 0.1 %
HEMATOCRIT: 25.7 % — ABNORMAL LOW (ref 41.0–53.0)
HEMOGLOBIN: 8.4 g/dL — ABNORMAL LOW (ref 13.5–17.5)
LARGE UNSTAINED CELLS: 0 % (ref 0–4)
LYMPHOCYTES ABSOLUTE COUNT: 0.7 10*9/L — ABNORMAL LOW (ref 1.5–5.0)
LYMPHOCYTES RELATIVE PERCENT: 8 %
MEAN CORPUSCULAR HEMOGLOBIN CONC: 32.7 g/dL (ref 31.0–37.0)
MEAN CORPUSCULAR HEMOGLOBIN: 26.8 pg (ref 26.0–34.0)
MEAN CORPUSCULAR VOLUME: 81.9 fL (ref 80.0–100.0)
MEAN PLATELET VOLUME: 8 fL (ref 7.0–10.0)
MONOCYTES ABSOLUTE COUNT: 0.3 10*9/L (ref 0.2–0.8)
MONOCYTES RELATIVE PERCENT: 2.7 %
NEUTROPHILS ABSOLUTE COUNT: 8.2 10*9/L — ABNORMAL HIGH (ref 2.0–7.5)
NEUTROPHILS RELATIVE PERCENT: 88.9 %
PLATELET COUNT: 68 10*9/L — ABNORMAL LOW (ref 150–440)
RED BLOOD CELL COUNT: 3.14 10*12/L — ABNORMAL LOW (ref 4.50–5.90)
RED CELL DISTRIBUTION WIDTH: 26.6 % — ABNORMAL HIGH (ref 12.0–15.0)
WBC ADJUSTED: 9.2 10*9/L (ref 4.5–11.0)

## 2019-08-24 LAB — COMPREHENSIVE METABOLIC PANEL
ALBUMIN: 2.9 g/dL — ABNORMAL LOW (ref 3.5–5.0)
ALKALINE PHOSPHATASE: 631 U/L — ABNORMAL HIGH (ref 65–260)
ALT (SGPT): 383 U/L — ABNORMAL HIGH (ref <50)
ANION GAP: 9 mmol/L (ref 7–15)
AST (SGOT): 162 U/L — ABNORMAL HIGH (ref 19–55)
BILIRUBIN TOTAL: 29.1 mg/dL — ABNORMAL HIGH (ref 0.0–1.2)
BUN / CREAT RATIO: 31
CALCIUM: 8.2 mg/dL — ABNORMAL LOW (ref 8.5–10.2)
CHLORIDE: 107 mmol/L (ref 98–107)
CO2: 23 mmol/L (ref 22.0–30.0)
CREATININE: 0.87 mg/dL (ref 0.70–1.30)
EGFR CKD-EPI AA MALE: >90 mL/min/{1.73_m2} (ref >=60)
EGFR CKD-EPI NON-AA MALE: >90 mL/min/{1.73_m2} (ref >=60)
GLUCOSE RANDOM: 317 mg/dL — ABNORMAL HIGH (ref 70–179)
PROTEIN TOTAL: 6.2 g/dL — ABNORMAL LOW (ref 6.5–8.3)
SODIUM: 139 mmol/L (ref 135–145)

## 2019-08-24 LAB — PROTIME-INR: PROTIME: 17.5 s — ABNORMAL HIGH (ref 10.2–13.1)

## 2019-08-24 LAB — PHOSPHORUS: Phosphate:MCnc:Pt:Ser/Plas:Qn:: 3

## 2019-08-24 LAB — SOLUBLE LIVER ANTIGEN: Lab: <20.1

## 2019-08-24 LAB — INR: Coagulation tissue factor induced.INR:RelTime:Pt:PPP:Qn:Coag: 1.51

## 2019-08-24 LAB — BILIRUBIN DIRECT: Bilirubin.glucuronidated+Bilirubin.albumin bound:MCnc:Pt:Ser/Plas:Qn:: 25.8 — ABNORMAL HIGH

## 2019-08-24 LAB — NEUTROPHILS ABSOLUTE COUNT: Neutrophils:NCnc:Pt:Bld:Qn:Automated count: 8.2 — ABNORMAL HIGH

## 2019-08-24 LAB — GAMMA GLUTAMYL TRANSFERASE: Gamma glutamyl transferase:CCnc:Pt:Ser/Plas:Qn:: 409 — ABNORMAL HIGH

## 2019-08-24 LAB — CO2: Carbon dioxide:SCnc:Pt:Ser/Plas:Qn:: 23

## 2019-08-24 LAB — VANCOMYCIN TROUGH: Vancomycin^trough:MCnc:Pt:Ser/Plas:Qn:: 13.8

## 2019-08-24 LAB — BILIRUBIN, DIRECT: BILIRUBIN DIRECT: 25.8 mg/dL — ABNORMAL HIGH (ref 0.00–0.40)

## 2019-08-24 LAB — MAGNESIUM: Magnesium:MCnc:Pt:Ser/Plas:Qn:: 1.5 — ABNORMAL LOW

## 2019-08-24 NOTE — Unmapped (Signed)
Pt afebrile, VSS. No c/o pain or nausea. No prns given. Voiding adequately, 1x bm during shift. Pt went down at ~0800 to VIR for liver biopsy, but was cancelled when downstairs. Pt returned to floor at ~1230. MD notified RN that cannot use PICC d/t bacteremia.  Fluids d/ced and PICC hl'd at around 1300. 2x peripheral Bcx drawn by phlebotomy. Peds spec. To bedside and 1 PIV established (2 attempts with [place]). Needed to go to another room but would return to establish a second PIV. MD notified, RN reviewed med prioritization with Mds. Upon flush, PIV infiltrated and removed. Md notified. Peds specialty to bedside more attempts with [place], pt asked specialty to leave after 2 sticks saying he had enough for the day. MD notified x2. Md to bedside to encourage PIV but agreed at 1800 to use PICC until tomorrow when pt agreed to additional PIV attempts. Med prioritization with MD, vancy infusing. PICC c/d/I, blood return noted, dressing change due today but MD okayed delay until line dc'd tomorrow. ACHS POC and insulin attempted, though pt stated spoke with mds that no longer needed. RN had thorough conversation with pt about importance of monitoring and insulin. Pt said he would consider it in evening. RN continued to encourage bs and insulin.  MD notified about starting fluids with vancy, no orders placed. No family at bedside, will ctm and follow POC. CHG given.       Problem: Adult Inpatient Plan of Care  Goal: Plan of Care Review  Outcome: Progressing     Problem: Wound  Goal: Optimal Wound Healing  Outcome: Progressing     Problem: Fall Injury Risk  Goal: Absence of Fall and Fall-Related Injury  Outcome: Progressing

## 2019-08-24 NOTE — Unmapped (Addendum)
Pt VSS and afebrile. Pt with decreased appetite. Pt had good po fluid intake today. Pt had x1 loose BM. Pt with adequate amber urine output. Pt refused BG checks. MD notified. No new orders. Pt did not eat meals and refused lispro insulin. MD notified. No new orders. Per MD orders PICC dressing c/d/I and infusing IVF. RN asked the team to discuss pt going outside to in the play atrium with child life. MD to decide and get back to nursing.  No PRNs given. No one at bedside. Will continue to monitor.         Problem: Adult Inpatient Plan of Care  Goal: Plan of Care Review  Outcome: Ongoing - Unchanged  Goal: Patient-Specific Goal (Individualization)  Outcome: Ongoing - Unchanged  Goal: Absence of Hospital-Acquired Illness or Injury  Outcome: Ongoing - Unchanged  Goal: Optimal Comfort and Wellbeing  Outcome: Ongoing - Unchanged  Goal: Readiness for Transition of Care  Outcome: Ongoing - Unchanged  Goal: Rounds/Family Conference  Outcome: Ongoing - Unchanged     Problem: Wound  Goal: Optimal Wound Healing  Outcome: Ongoing - Unchanged     Problem: Fall Injury Risk  Goal: Absence of Fall and Fall-Related Injury  Outcome: Ongoing - Unchanged     Problem: Self-Care Deficit  Goal: Improved Ability to Complete Activities of Daily Living  Outcome: Ongoing - Unchanged

## 2019-08-24 NOTE — Unmapped (Signed)
Endocrinology Consult - Follow Up Note    Requesting Attending Physician :  Melba Coon, MD  Service Requesting Consult : Ped Gastroenterology [facility])  Primary Care Provider: [name], MD  Assessment/Recommendations:      Glucocorticoids induced hyperglycemia- A1c 4.9% (10/2018)- Discussed his refusal of insulin and glycemic checks today at length. He agreed to our team that he would take insulin and check sugars.  ??  Plan  - Lantus 10U nightly, half dose if NPO  -??Lispro??4units TIDAC with meals??  -????PRN Lispro dose for overnight meal: if a normal sized meal give 4 units, if smaller give 2 units lispro  - Continue sensitive sliding scale QACHS     ??  Seen and discussed with Dr. Marcello Fennel??  ??  Primary team informed of recommendations. We will continue to follow patient as needed. Please call/page [phone] with questions or concerns.     Elpidio Galea, MD  PGY5 Endocrinology Fellow    I saw and evaluated the patient, participating in the key portions of the service.  I reviewed the resident???s note.  I agree with the resident???s findings and plan.     Thane Edu, MD, MPH  Attending - Endocrinology and Metabolism    I saw and evaluated the patient, participating in the key portions of the service.  I reviewed the resident???s note.  I agree with the resident???s findings and plan.     Thane Edu, MD, MPH  Attending - Endocrinology and Metabolism    Time spent reviewing chart 10 minutes  Time spent with fellow 10 minutes  Time spent at bedside 10 minutes      Subjective/24 hour events:      Refused accuchecks and insulin yesterday. Glucose in the 300s. Talked at length today about why he would not take insulin or glycemic checks.    Objective: :  BP 126/71  - Pulse 84  - Temp 36.9 ??C (Oral)  - Resp 20  - Ht 174.6 cm (5' 8.74)  - Wt 60.4 kg (133 lb 2.5 oz)  - SpO2 100%  - BMI 19.81 kg/m??         General: chronically ill appearing, NAD  Eyes: EOMI  Lungs: non labored respirations on room air  Neuro: The patient was alert and appropriate for conversation.  Psych: Flat affect.      Test Results    Lab Results   Component Value Date    POCGLU 315 (H) 08/22/2019    POCGLU 202 (H) 08/22/2019    POCGLU 214 (H) 08/22/2019    POCGLU 238 (H) 08/21/2019    POCGLU 221 (H) 08/21/2019     Lab Results   Component Value Date    CREATININE 0.87 08/24/2019    NA 139 08/24/2019    K 3.8 08/24/2019    CL 107 08/24/2019    CO2 23.0 08/24/2019    ANIONGAP 9 08/24/2019    CALCIUM 8.2 (L) 08/24/2019    ALBUMIN 2.9 (L) 08/24/2019

## 2019-08-24 NOTE — Unmapped (Signed)
Pediatric Infectious Disease Progress Note    I attest that I have reviewed the student note and that the components of the history of the present illness, the physical exam, and the assessment and plan documented were performed by me or were performed in my presence by the student where I verified the documentation and performed (or re-performed) the exam and medical decision making.  ASSESSMENT  [name] Gross is a 18 y.o. male with leukocytosis and strep mitis bacteremia in the setting of acute liver transplant rejection/failure. [name] has been hospitalized with culture-confirmed acute rejection since 9/24, and has been receiving high-dose immunosuppression with thymoglobulin, steroids, tacrolimus, and mycophenolate, with only modest improvement of his liver function. Ascites has increased throughout this time as well. On 10/12 he was found to have leukocytosis without any localizing symptoms of infections, including no fever, no new/changed abdominal pain, and no N/V/D. Broad coverage was initiated with ceftriaxone, flagyl, micafungin, and vancomycin. Peripheral as well as central line blood cultures have been positive for strep mitis, which suggests a systemic source. Although the patient does have a PICC in place (since 9/28), it is also possible that there is a systemic source of infection, including SBP, as strep mitis is a known causative agent of SBP in patients with liver failure. However, given that a paracentesis would required sedation or anesthesia, at this time we can continue monitoring closely while on ceftriaxone and flagyl. In addition, a bacterial peritonitis would likely be monomicrobial and covered by the current antibiotic regimen. May also leave the PICC in place and treat through the line, given the difficulty of obtaining alternative access at this time.     RECOMMENDATIONS  -Ceftriaxone 2g q24h to cover for strep mitis bacteremia. Will follow clinical course to determine antibiotic duration. This will also provide additional broad-spectrum gram-negative coverage for intra-abdominal process.  -Continue Flagyl 500mg  q8h, to provide additional coverage of intra-abdominal process given difficulty of paracentesis at this time.   -May discontinue vancomycin  -Given difficulty of paracentesis, closely monitor for signs of bacterial peritonitis including fever, worsening abdominal pain, leukocytosis, altered mentation, or diarrhea.   -Would discontinue micafungin as no concerns for fungal infection at this time.  -May continue treating through the PICC line, no need to discontinue unless new positive cultures or clinical worsening.   -Repeat bcx until confirmed negative: please obtain a repeat central line bcx today  -Recommend echo to rule out vegetation    Thank you for asking [place] to see [name] Gross. We will continue to follow.   Merlinda Frederick, MS4  Pediatric Infectious Diseases    SUBJECTIVE  Interval History: [name] is alert and feeling so-so this morning. He was able to eat dinner last night but is not feeling hungry this morning. He reports discomfort and tightness in his abdomen but otherwise denies any new symptoms or pain. He has been afebrile over the past 24 hrs. Peripheral blood cultures x2 from 10/13 are no growth x 24 hrs.     History provided by patient, primary team, review of records.    Current antibiotics:  Anti-infectives (From admission, onward)    Start     Dose/Rate Route Frequency Ordered Stop    08/23/19 0200  metroNIDAZOLE (FLAGYL) IVPB 500 mg      500 mg  100 mL/hr over 60 Minutes Intravenous Every 8 hours 08/23/19 0121 08/30/19 0459    08/23/19 0200  cefTRIAXone (ROCEPHIN) 2 g in sodium chloride 0.9 % (NS) 100 mL IVPB-connector bag  2 g  200 mL/hr over 30 Minutes Intravenous Every 24 hours 08/23/19 0121 08/30/19 0559    08/22/19 2100  rifAXIMin (XIFAXAN) tablet 550 mg      550 mg Oral 2 times a day (standard) 08/22/19 1417 09/21/19 2059    08/15/19 0900  sulfamethoxazole-trimethoprim (BACTRIM) 400-80 mg tablet 80 mg of trimethoprim      1 tablet Oral Every Mon-Wed-Fri 08/13/19 0924 02/13/20 0859    08/12/19 1515  valGANciclovir (VALCYTE) tablet 450 mg      450 mg Oral Daily (standard) 08/12/19 1512 11/09/19 1659    08/12/19 1500  nystatin (MYCOSTATIN) oral suspension      1,000,000 Units Oral 3 times a day (standard) 08/12/19 1453 09/11/19 1359        Other medications reviewed    OBJECTIVE    Vital signs in last 24 hours:  Temp:  [36.5 ??C (97.7 ??F)-37.1 ??C (98.8 ??F)] 36.5 ??C (97.7 ??F)  Heart Rate:  [81-102] 102  SpO2 Pulse:  [84-89] 84  Resp:  [20-24] 20  BP: (118-136)/(71-87) 118/71  MAP (mmHg):  [86-102] 86  SpO2:  [99 %-100 %] 100 %    Physical Exam:  Constitutional: Laying in bed, alert, interactive, pleasant. Appears more energetic this morning, speech is more clear.   Head: Normocephalic atraumatic  Eyes: Icteric sclera, no discharge  Ears/Nose/Mouth/Throat: no nasal discharge, significant white/pink plaques on hard palate, posterior oropharynx. Mild gingival inflammation noted.   Neck: No masses or tenderness, supple, no LAD   Respiratory: Clear to auscultation, good air entry in all fields. No wheezing, crackles or rhonchi, breathing unlabored.   Cardiovascular: Regular rate and rhythm, normal S1/S2, no murmurs appreciated. Peripheral perfusion intact. Edema of lower extremities bilaterally, L>R   Gastrointestinal: Distended, taut, no fluid wave elicited. Tenderness to deeper palpation of LUQ and RUQ. Difficult to appreciate hepatomegaly given ascites. No other masses appreciated.   Neurologic: Alert, oriented. Speech is more clear, less halting than previous. CN II-XII grossly intact. Moderate tremor of hands unchanged from previous. No asterixis. Sensation, motor intact.   Musculoskeletal: Extremities warm and well-perfused, no edema, moves all extremities equally.   Skin: Significant jaundice throughout. 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   Psychiatric: Appropriate for age, pleasant, interactive.     Labs:  Labs:  WBC 9.2 (down from 17.3 on 10/12)  88.97% neuts, ANC 8200  Hgb 8.4, plt 68  Na 139, K 3.8, Cl 107, BUN 23, Cr 0.87  Glucose 317  Ca 8.2, Mg 1.5, Phos 3.0  Alb 2.0  Tbili 29.1, dbili 25.8  AST 162, ALT 383, AP 631  PT/INR 17.5/1.51    Microbiology:    Culture results reviewed:  10/12 bcx central line: GPC in chains, strep mitis +  10/12 bcx peripheral: GPC in chains, strep mitis +  10/13 bcx peripheral 1: NG x 24 hrs  10/13 bcx peripheral 2: NG x 24 hrs  10/14 bcx central line: pending     Imaging:   MRI abdomen 10/9: The central hepatic bile ducts appear slightly prominent and this slightly increased compared to prior. Otherwise no overt dilatation of the biliary ductal system. The common bile duct is normal caliber. No obvious stenosis or stricture is identified. No obvious bile duct wall enhancement is seen. Heterogeneous enhancement of the liver. There is abnormal mildly high T2 signal along the portal triads. These findings are not specific but could be secondary to hepatocellular injury including rejection, acute or chronic liver disease or cholangitis. Porta hepatis  lymph nodes measuring up to 1.0 cm are also identified (series 15 image 40) prominent portacaval lymph nodes are also seen. These are likely reactive. Unchanged dilatation of the right and left portal vein which is more prominent on the right side measuring up to 2.2 cm. Large volume ascites. Mild wall thickening of small bowel loops. This is not specific and could be secondary to edema/ascites. Subcutaneous questionable emphysema in the right flank, likely from recent liver biopsy. Anasarca.  CXR 10/12: New right basilar atelectasis versus airspace disease and trace right pleural effusion.  I personally spent 25 minutes on the floor or unit in direct patient care. The direct patient care time included face-to-face time with the patient, reviewing the patient's chart, communicating with the family and/or other professionals and coordinating care. Greater than 50% of this time was spent in counseling or coordinating care with the patient regarding strep mitis bacteremia.

## 2019-08-24 NOTE — Unmapped (Signed)
Pediatric Palliative Care Consult Note:  ??  [name] Gross is a 18 y.o. male seen for pediatric palliative care consultation at the request of Dr. Curtis Sites for goals of care, advance care planning, and family support.  ??  Primary Care Provider: [name], MD  ??  History provided by: patient  ??  Assessment:  ??  [name] Gross is a 18 y.o. 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 His liver function has not yet responded to high dose steroids and thymoglobulin, which is concerning for possible need for another liver transplant if he could be eligible, which providers have expressed is unlikely for psychosocial reasons. [name] Gross is atrisk for losing his ability t o care for himself/make decisions for himself. He has been encouraged to identify a surrogate decision maker (he does not wish for this to default to next of kin) during this time while he is at risk for further decompensation/further decline in health.    ??  Summary of Discussion and Recommendations:   ??  1. SYMPTOMS:   - Nausea: Reported to be improved on current regimen.  Appetite also increased.  Has been able to choose from a variety of foods off menu.  - Insomnia: Scheduled melatonin nightly.  Dose increased from 3 mg to 6 mg on 10/12.  Also agrees he would benefit from working more with Psych on managing anxiety which is interfering with sleep - hopes to see Dr. Ike Bene again this week.  Earplugs available at bedside.  - Mood: History of depression and suicide attempt in Apirl 2019, denied SI to other providers during this hospital stay. Also endorses feeling anxious about his condition and about his social situation. Adult psychiatry last saw [place] 10/6 and plans to f/u per note.  - ID: Peripheral and central line blood cultures positive for strep mitis, suggestive of systemic source; has PICC line. Liver biopsy and paracentesis canceled.  Immunosuppression therapy put on hold with additional plan to taper steroids.  ??  2. GOALS:   - [name] hopes to make a recovery, find housing, and find a job. He shared that he anticipates discharging to a shelter but notes is friends are encouraging him to move in with family - he believes this is because they are concerned about his safety.   - [name] has long term goals which include going back to school and becoming a Runner, broadcasting/film/video or nurse.   ??  3. DECISIONS:   - Given risk of hepatic encephalopathy, and general risk of his condition deteriorating, recommended Aldyn think about who he would want to be his surrogate decision maker if he was unable to make decisions for himself (see ACP below)   - Siddiq likes to receive information directly.   - Dr. Melrose Nakayama has reviewed with Aysen that redo liver transplant can be an option for some people and inquired whether this is something [name] knows about. Joshaua acknowledged knowing that he may not be a candidate and that he doesn't know if he would choose to pursue another transplant. Note that he has been told by transplant SW that if he would like to be a candidate for liver transplant, he needs social supports including stable housing, transportation, source of income, et.    - [facility] Decision Making Tool given and explained at a previous visit. He was encouraged to use this tool along with an ACP guide  to document information he receives from providers   ??  4. ADVANCE CARE PLANNING  - Reminded [name] of the Voicing My Choices booklet and offered to assist him with completing book if wanted/needed. [name] worked on Voicing My Choices more over the weekend and would like to designate a Runner, broadcasting/film/video. See ACP note.   - Recommended [name] think about who he would want to be his surrogate decision maker if he was unable to make decisions for himself. He has repeatedly shared that he would not trust his mother to make decisions for him. [name] has started this conversation with friends, [name] and [name], who he agrees would need more information about what that role may look like and not updated on Edge's current clinical status   - Code status: full code, not discussed today  - Prognosis: Guarded given severity of liver disease and potential for lack of response to salvage therapies, AKI, and complex social situation with lack of caregiver support  ??  5. SUPPORT:   - Jarone describes limited personal supports including an aunt and maternal grandparents, and friends. He has not spoken to any family since being admitted to the hospital. He has reached out to two friends who have been good supports throughout his illness  - Discussed hospital based resources including chaplains.  - Estevan agreed to begin SSI application as suggested by transplant social worker, as the process could take months to complete.  He has since decided to wait until he was on a secure network instead of the an open/public network at the hospital due to security concerns.  He was also told how to access his medical records.  ??  6. CARE COORDINATION:   - Care coordinated with Dr. Darnelle Bos (GI Fellow) and nursing.   - Dr. Melrose Nakayama (primary GI specialist) to f/u this week.  We would be happy to meet jointly to continue this conversation.    ??    Total time spent with patient for evaluation & management (excluding ACP documented separately): 15 minutes.  Greater than 50% of this time spent on counseling/coordination of care: Yes.  See ACP Note from today for additional billable service: Yes, 30 minutes.     We appreciate the consult. The Children's Supportive Care Team can be reached by pager [name]) or email ([email or web address]).   ??  ??  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 His hospital course has been complicated by AKI, significant ascites. His liver function has not yet responded to pulse dose steroids, primary team concerned about overall prognosis and need for repeat liver transplant.   ??  Interval events:  [name] Gross was downstairs in VIR for planned liver biopsy and paracentesis today when he learned the procedures were canceled due to diagnosis of bacteremia.  When asked about the plan for his care moving forward, he stated that all he could do was wait and see.  Coran did not say more beyond this statement, so we were unsure if he was aware that immunosuppression therapy had also been put on hold in addition to tapering steroids, or if he just did not want discuss his clinical status further because of the implications of this finding.    See ACP note as Jacaden was ready to name  surrogate decision makers today.  Jerred met with with Dr. Melrose Nakayama last week to review his understanding of his current condition and what the future may hold.  See 10/9 note for summary.    Allergies:  Bee pollen and Pollen extracts  ??  Medications:  Scheduled Meds:  ??? calcium carbonate  300 mg elem calcium Oral BID   ??? cefTRIAXone  2 g Intravenous Q24H   ??? ergocalciferol  50,000 Units Oral Weekly   ??? flu vacc [id number] 6mos up(PF)  0.5 mL Intramuscular During hospitalization   ??? insulin glargine  10 Units Subcutaneous Nightly   ??? insulin lispro  0-5 Units Subcutaneous ACHS   ??? insulin lispro  4 Units Subcutaneous TID AC   ??? melatonin  6 mg Oral QPM   ??? methylPREDNISolone sodium succinate  100 mg Intravenous Once   ??? metronidazole  500 mg Intravenous Q8H   ??? micafungin  150 mg Intravenous Q24H SCH   ??? multivitamin, with zinc  2 tablet Oral Daily   ??? mupirocin   Topical TID   ??? nystatin  1,000,000 Units Oral TID   ??? ondansetron  4 mg Intravenous Once   ??? pantoprazole  40 mg Oral Daily   ??? phytonadione (vitamin K1)  7.5 mg Oral Once   ??? rifAXIMin  550 mg Oral BID   ??? sulfamethoxazole-trimethoprim  1 tablet Oral Q MWF   ??? valGANciclovir  450 mg Oral Daily   ??? vancomycin dilution  15 mg/kg (Adjusted) Intravenous Q8H     Continuous Infusions:  ??? sodium chloride       PRN Meds:.dextrose 50 % in water (D50W), heparin, porcine (PF), insulin lispro, ondansetron, polyethylen glycol    Physical Exam:   Vitals:    08/23/19 2321   BP: 126/71   Pulse: 84   Resp: 20   Temp: 36.9 ??C (98.5 ??F)   SpO2: 100%   ??  Gen: thin young man sitting up in chair eating lunch, NAD.  Skin: jaundiced.  HEENT: +scleral icterus.   Chest: normal work of breathing  Abd: protruberant  Psych: reserved, flat affect, converses fluently.   ??  Data: Reviewed test results in Epic.    La Lindwood Qua, [place], CPNP  Children[facility] Supportive Care Team  [phone] (pager)        Collaborating Physician Attestation    I was the supervising physician in the delivery of the service. I was involved with making the recommendations contained in this note.     Waldon Reining, MD, MPH  Attending Physician, Children's Supportive Care Team

## 2019-08-24 NOTE — Unmapped (Signed)
Pediatric Vancomycin Therapeutic Monitoring Pharmacy Note    [name] Gross is a 18 y.o. male starting vancomycin. Date of therapy initiation: 08/23/19    Indication: Central line associated bloodstream infection Strep viridans  Prior Dosing Information: None/new initiation     Dosing Weight: 60.4 kg    Goals:  Therapeutic Drug Levels  Vancomycin trough goal: 10-15 mg/L    Additional Clinical Monitoring/Outcomes  Renal function, volume status (intake and output)    Results: Vancomycin level 13.8 mg/L, drawn appropriately    Wt Readings from Last 1 Encounters:   08/24/19 60.4 kg (133 lb 2.5 oz) (17 %, Z= -0.97)*     * Growth percentiles are based on CDC (Boys, 2-20 Years) data.     Creatinine   Date Value Ref Range Status   08/24/2019 0.87 0.70 - 1.30 mg/dL Final   16/08/9603 5.40 0.70 - 1.30 mg/dL Final   98/09/9146 8.29 0.70 - 1.30 mg/dL Final        Pharmacokinetic Considerations and Significant Drug Interactions:  ? Dosed per pediatric guideline  ? Concurrent nephrotoxic meds: Bactrim, valgancyclovir    Assessment/Plan:  Recommendation(s)  ? Continue current regimen of vancomycin 906 mg (15 mg/kg) IV every 8 hours    Follow-up  ? Next level due: in 3-5 days  ? A pharmacist will continue to monitor and order levels as appropriate    Please page service pharmacist with questions/clarifications.    Darrick Grinder, PharmD, BCPS, BCPPS  [phone]

## 2019-08-24 NOTE — Unmapped (Addendum)
Pt afebrile, VSS. No c/o pain overnight. Asked MD about adding IVF d/t vanc, MD wanted to hold off for now. Caught up on medications overnight. Prioritized IV abx first then gave platelets followed by albumin. Tolerated well. Lasix given following plt and albumin. Pt voiding great, 3+ L overnight. PICC c/d/I labs drawn, infusing. Pt continues to refuse accu checks and insulin. Spent >30 minutes educating patient on importance of monitoring BG and taking insulin, but pt continued to refuse. MD notified and said to hold lantus for the night. Pt resting well overnight. Will continue to monitor.

## 2019-08-24 NOTE — Unmapped (Signed)
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 Patient has discussed prognosis with Dr. Melrose Nakayama and palliative care (10/9). Patient understands that transplant is an option but that he may not be a candidate.We will continue to discuss his options with him. His active issues are a tenuous fluid balance, showing ascites and pitting edema on exam. He has steroid-induced hyperglycemia, for which we have consulted adult Endocrine and started meal coverage and sliding scale lispro and will continue to monitor his sugars. His R ear and L posterior neck lesions are improving with Mupirocin.  On 10/12 patient developed leukocytosis concerning for infection. Infectious workup revealed blood cultures positive for GPC's, and empiric antibiotics were started.  As a result of bacteremia, liver biopsy was canceled and immunosuppression therapy was held.     #Hx Liver Transplant - Subacute/Acute Liver Failure: ??Pt has biopsy confirmed severe acute rejection (9/25), RAI 9/9. Labs today essentially unchanged w/out robust response to Thymo, however, overall trend of AST and alk phos during this admission has been downward a bit.   - tranplant team recommending liver biopsy with paracentesis at that time, but this was delayed due to concern for bacteremia. Plan to reschedule once bacteremia adequately addressed  - Will continue to hold immunosuppressive therapy    - DC'd Tacro and Mycophenolate   - S/p 12 days of Thymoglobulin with high dose MethylPrednisolone (10/1-10/12) with poor overall repsonse to therapy.    - Tapering methylpred: 100 mg today, 80mg  tomorrow   - Continue Valgancyclovir 450mg  daily (3months) and Bactrim MWF (27mo) for prophylaxis (10/2-)  - Continue on Nystatin 10ml TID (58mo) prophylaxis (10/2-)  - PT/INR every other day. INR 1.51 today--giving vit K1 7.5mg  IV   - Protonix 40 mg PO daily  - Consider albumin infusion for hypoalbuminemia prn   - Continue Rifaximin     #Strep mitis Bacteremia  Developed sudden leukocytosis with white count elevated to 17.3 10/12, afebrile, non-toxic appearing, without localizing symptoms.  Differential included SBP or other source of infection.  Lung sounds noted to right lower lobe but CXR showed only atelectasis.  UA unremarkable. BCx positive for GPC's in chains overnight 10/12 -10/13, started on empiric broad-spectrum antibiotics.  Unknown source of infection.   - f/u Blood culture x2, Ordered PICC line Cx today   - Per ID recs: will discontinue vanc and mica. Continuing ctx and flagyl  - Currently planning to treat through PICC line infection. Will consider pulling PICC in future   - f/u Echo for endocarditis     #Edema, ascites  Ascites seems to have worsened over the past week.  Noted as large volume  ascites on MRCP.  Concerned that patient could easily become fluid overloaded, so monitoring fluid status closely.  - Monitor fluid status and daily weights  - Encouraging PO intake (>1.6L of fluids PO/day)   - STRICT I/Os      #Nausea/emesis  - Zofran??q8 hours PO PRN    #Nutrition  - Regular diet  - Glucerna or Ensure supplements   - Ergocalciferol qMon   - aquadex multivitamin    - Daily CMP, Mg, Ph    #Hyperglycemia   - Lantus 10 U nightly; will half if being made NPO. Continue 4 units TIDAC lispro w/meals.   - Continues to have elevated BG. Endocrine discussed with pt and he agreed to better compliance going forward   - PRN dose for overnight meal; if a normal sized meal give 4 units, if smaller meal give 2 units  - continue sensitive lispro sliding scale with meals and in the evenings  - glucose checks qAC (including nighttime meal)    #Lesion on Right Ear and Left posterior Neck, culture +staph aureus   -Lesions healing well.  -Mupirocin ointment for both lesions until healed, per derm rec  ??  #Major Depressive Disorder, insomnia: Stable. Has hx of attempted overdoses, with the last attempt in April 2019??at which point he was hospitalized in the Adolescent Psychiatry unit and discharged on Lexapro 20 mg. ??He has not engaged with mental health resources or taken medication for depression or insomnia in 8-10 months.????Denies SI, passive suicidality, self-harm, or HI at this time.    - Patient saw adult psychiatry team again on 10/13. Offered SSRI, pt considering   - melatonin 5 mg 1 hour before bedtime    #Constipation  -Continue Miralax 17 g BID PRN     #Deconditioning. Patient endorses feeling weak and having difficulty in moving around.  He has been working hard to get out of bed and do exercises in his room.  -PT following  -Encourage OOB and ambulation  ??  #Severe Malnutrition:????Prior to admission, patient has been skipping meals due to poor appetite and food insecurity. Albumin persistently low in setting of liver disease.  - Nutrition consult, appreciate recs  - Thiamine   - Multivitamin, Ca 300 mg BID, vit D 1000u QD   - Monitor phosphorous level    #Homelessness - Social Concerns:??Jadore has experienced homelessness for ~5 months when he left his mother's house. ??He has severed all ties with his family and states his relationship with his mother cannot be reconciled. ??He also requested contact information of family members to be erased from his chart (this has been completed, but social worker did drop contact info into her note on 9/24 in case he changes his mind during admission). ??In addition to food insecurity, he has not been able to secure medications. ??He is routinely exposed to alcohol and drugs (marijuana, cocaine, etc.) second-hand through his friendships though he denies current use himself. ??On urine drug screen, he tested positive for marijuana only.  -Supportive care following. Planning to name surrogate decision maker if he is incapacitated (likely a close friend)  -Patient voiced that he may reach out to his grandmother and grandfather  -Will follow up on desired HCPOA   ??  Access: PICC (placed 9/28)  ??  Discharge criteria:??liver transplant work-up and treatment of acute rejection    Subjective:     Interval History:   Patient lying in bed. Conversational, but with flat affect. States he feels well.     Objective:  Vital signs in last 24 hours:  Temp:  [36.5 ??C-37.1 ??C] 36.8 ??C  Heart Rate:  [81-102] 88  SpO2 Pulse:  [84-89] 84  Resp:  [18-24] 18  BP: (118-126)/(71-80) 124/75  MAP (mmHg):  [86-94] 90  SpO2:  [100 %] 100 %  Intake/Output last 3 shifts:  I/O last 3 completed shifts:  In: 4207 [P.O.:1880; I.V.:1963; Blood:364]  Out: 6120 [Urine:6120]    Physical Exam:  General:   Laying in bed, awake, very thin and weak appearing  Head: normocephalic, atraumatic.  Eyes: EOMI. Scleral icterus present.   Nose:   clear, no discharge  Lungs: clear breath sounds bilaterally. No increased work of breathing. No wheezes/crackles.  Heart: regular rate and rhythm. No murmurs appreciated on auscultation.  Abdomen:  Abdomen soft, distended, soft, mild TTP in RUQ and RLQ. Ascites present  Neuro: deconditioned, moves all extremities with decreased strength  Extremities:. Pitting edema in the LE, [facility]  Skin:Skin lesion present on right ear and left neck, healing well. Jaundiced throughout. Acne present on face, back, chest    Active Medications reviewed and KEY Medications include:     Current Facility-Administered Medications:   ???  calcium carbonate (OS-CAL) tablet 300 mg elem calcium, 300 mg elem calcium, Oral, BID, David Stall, MD, 300 mg elem calcium at 08/24/19 1027  ???  cefTRIAXone (ROCEPHIN) 2 g in sodium chloride 0.9 % (NS) 100 mL IVPB-connector bag, 2 g, Intravenous, Q24H, David Stall, MD, Last Rate: 200 mL/hr at 08/24/19 0625, 2 g at 08/24/19 0981  ???  dextrose 50 % in water (D50W) 50 % solution 12.5 g, 12.5 g, Intravenous, Q10 Min PRN, David Stall, MD  ???  ergocalciferol (DRISDOL) capsule 50,000 Units, 50,000 Units, Oral, Weekly, David Stall, MD, 50,000 Units at 08/22/19 0848  ???  heparin preservative-free injection 10 units/mL syringe (HEPARIN LOCK FLUSH), 20 Units, Intravenous, Daily PRN, David Stall, MD, 20 Units at 08/23/19 1300  ???  influenza vaccine quad (FLUARIX, FLULAVAL, FLUZONE) (6 MOS & UP) 2020-21, 0.5 mL, Intramuscular, During hospitalization, David Stall, MD  ???  insulin glargine (LANTUS) injection 10 Units, 10 Units, Subcutaneous, Nightly, David Stall, MD, Stopped at 08/23/19 2244  ???  insulin lispro (HumaLOG) injection 0-5 Units, 0-5 Units, Subcutaneous, ACHS, David Stall, MD, Stopped at 08/23/19 2243  ???  insulin lispro (HumaLOG) injection 2-4 Units, 2-4 Units, Subcutaneous, Nightly PRN, David Stall, MD  ???  insulin lispro (HumaLOG) injection 4 Units, 4 Units, Subcutaneous, TID AC, David Stall, MD, 4 Units at 08/22/19 1459  ???  melatonin tablet 6 mg, 6 mg, Oral, QPM, David Stall, MD, 6 mg at 08/23/19 2031  ???  methylPREDNISolone sodium succinate (PF) (Solu-MEDROL) injection 100 mg, 100 mg, Intravenous, Once, Sallyanne Havers, MD  ???  [START ON 08/25/2019] methylPREDNISolone sodium succinate (PF) (Solu-MEDROL) injection 80 mg, 80 mg, Intravenous, Once, Lewie Loron, MD  ???  metroNIDAZOLE (FLAGYL) IVPB 500 mg, 500 mg, Intravenous, Q8H, David Stall, MD, Last Rate: 100 mL/hr at 08/24/19 1352, 500 mg at 08/24/19 1352  ???  multivitamin, with zinc (AQUADEKS) chewable tablet, 2 tablet, Oral, Daily, David Stall, MD, 2 tablet at 08/24/19 0845  ???  mupirocin (BACTROBAN) 2 % ointment, , Topical, TID, David Stall, MD  ???  nystatin (MYCOSTATIN) oral suspension, 1,000,000 Units, Oral, TID, David Stall, MD, 1,000,000 Units at 08/24/19 0844  ???  ondansetron (ZOFRAN) injection 4 mg, 4 mg, Intravenous, Once, [employer]  C Gutierrez-Wu, MD  ???  ondansetron (ZOFRAN-ODT) disintegrating tablet 4 mg, 4 mg, Oral, Q8H PRN, David Stall, MD  ???  pantoprazole (PROTONIX) EC tablet 40 mg, 40 mg, Oral, Daily, David Stall, MD, 40 mg at 08/23/19 2141  ???  [START ON 08/25/2019] phytonadione (vitamin K1) (AQUA-MEPHYTON) 7.5 mg in sodium chloride (NS) 0.9 % 50 mL IVPB, 7.5 mg, Intravenous, Daily, Lewie Loron, MD  ???  polyethylene glycol (MIRALAX) packet 17 g, 17 g, Oral, BID PRN, David Stall, MD  ???  rifAXIMin (XIFAXAN) tablet 550 mg, 550 mg, Oral, BID, David Stall, MD, 550 mg at 08/24/19 0845  ???  sodium chloride (NS) 0.9 % infusion, , Intravenous, Continuous, David Stall, MD  ???  sulfamethoxazole-trimethoprim (BACTRIM) 400-80 mg tablet 80 mg of trimethoprim, 1 tablet, Oral, Q MWF, David Stall, MD, 80 mg of trimethoprim at 08/24/19 0845  ???  valGANciclovir (VALCYTE) tablet 450 mg, 450 mg, Oral, Daily, David Stall, MD, 450 mg at 08/23/19 1722        Studies: Personally reviewed and interpreted.  Labs/Studies:  Labs and Studies from the last 24hrs per EMR and Reviewed and   All lab results last 24 hours:    Recent Results (from the past 24 hour(s))   Prepare Platelet Pheresis    Collection Time: 08/23/19  8:52 PM   Result Value Ref Range    Unit Blood Type A Pos     ISBT Number 6200     Unit # [id number]     Status Issued     Product ID Platelets     PRODUCT CODE [id number]    Comprehensive Metabolic Panel    Collection Time: 08/24/19  5:10 AM   Result Value Ref Range    Sodium 139 135 - 145 mmol/L    Potassium 3.8 3.5 - 5.0 mmol/L    Chloride 107 98 - 107 mmol/L    Anion Gap 9 7 - 15 mmol/L    CO2 23.0 22.0 - 30.0 mmol/L    BUN 27 (H) 7 - 21 mg/dL    Creatinine 4.03 4.74 - 1.30 mg/dL    BUN/Creatinine Ratio 31     EGFR CKD-EPI Non-African American, Male >90 >=60 mL/min/1.80m2    EGFR CKD-EPI African American, Male >90 >=60 mL/min/1.95m2    Glucose 317 (H) 70 - 179 mg/dL    Calcium 8.2 (L) 8.5 - 10.2 mg/dL    Albumin 2.9 (L) 3.5 - 5.0 g/dL    Total Protein 6.2 (L) 6.5 - 8.3 g/dL    Total Bilirubin 25.9 (H) 0.0 - 1.2 mg/dL    AST 563 (H) 19 - 55 U/L    ALT 383 (H) <50 U/L    Alkaline Phosphatase 631 (H) 65 - 260 U/L   Magnesium Level    Collection Time: 08/24/19  5:10 AM   Result Value Ref Range    Magnesium 1.5 (L) 1.6 - 2.2 mg/dL   Phosphorus Level    Collection Time: 08/24/19  5:10 AM   Result Value Ref Range    Phosphorus 3.0 2.9 - 4.7 mg/dL   PT-INR    Collection Time: 08/24/19  5:10 AM   Result Value Ref Range    PT 17.5 (H) 10.2 - 13.1 sec    INR 1.51    CBC w/ Differential    Collection Time: 08/24/19  5:10 AM   Result Value Ref Range    WBC 9.2 4.5 - 11.0 10*9/L  RBC 3.14 (L) 4.50 - 5.90 10*12/L    HGB 8.4 (L) 13.5 - 17.5 g/dL    HCT 81.1 (L) 91.4 - 53.0 %    MCV 81.9 80.0 - 100.0 fL    MCH 26.8 26.0 - 34.0 pg    MCHC 32.7 31.0 - 37.0 g/dL    RDW 78.2 (H) 95.6 - 15.0 %    MPV 8.0 7.0 - 10.0 fL    Platelet 68 (L) 150 - 440 10*9/L    Neutrophils % 88.9 %    Lymphocytes % 8.0 %    Monocytes % 2.7 %    Eosinophils % 0.1 %    Basophils % 0.1 %    Absolute Neutrophils 8.2 (H) 2.0 - 7.5 10*9/L    Absolute Lymphocytes 0.7 (L) 1.5 - 5.0 10*9/L    Absolute Monocytes 0.3 0.2 - 0.8 10*9/L    Absolute Eosinophils 0.0 0.0 - 0.4 10*9/L    Absolute Basophils 0.0 0.0 - 0.1 10*9/L    Large Unstained Cells 0 0 - 4 %    Microcytosis Marked (A) Not Present    Macrocytosis Slight (A) Not Present    Anisocytosis Marked (A) Not Present    Hypochromasia Slight (A) Not Present   Bilirubin, Direct    Collection Time: 08/24/19  5:10 AM   Result Value Ref Range    Bilirubin, Direct 25.80 (H) 0.00 - 0.40 mg/dL   Gamma GT (GGT)    Collection Time: 08/24/19 10:46 AM   Result Value Ref Range    GGT 409 (H) 12 - 109 U/L   Vancomycin, Trough    Collection Time: 08/24/19 10:46 AM   Result Value Ref Range    Vancomycin Tr 13.8 10.0 - 20.0 ug/mL     ========================================    Val Eagle, MD  Anesthesiology, PGY-1

## 2019-08-25 LAB — CBC W/ AUTO DIFF
BASOPHILS ABSOLUTE COUNT: 0 10*9/L (ref 0.0–0.1)
EOSINOPHILS ABSOLUTE COUNT: 0 10*9/L (ref 0.0–0.4)
EOSINOPHILS RELATIVE PERCENT: 0.4 %
HEMATOCRIT: 26.3 % — ABNORMAL LOW (ref 41.0–53.0)
HEMOGLOBIN: 8.6 g/dL — ABNORMAL LOW (ref 13.5–17.5)
LARGE UNSTAINED CELLS: 0 % (ref 0–4)
LYMPHOCYTES ABSOLUTE COUNT: 1.1 10*9/L — ABNORMAL LOW (ref 1.5–5.0)
LYMPHOCYTES RELATIVE PERCENT: 13.2 %
MEAN CORPUSCULAR HEMOGLOBIN CONC: 32.6 g/dL (ref 31.0–37.0)
MEAN CORPUSCULAR HEMOGLOBIN: 26.9 pg (ref 26.0–34.0)
MEAN PLATELET VOLUME: 7.3 fL (ref 7.0–10.0)
MONOCYTES ABSOLUTE COUNT: 0.2 10*9/L (ref 0.2–0.8)
NEUTROPHILS ABSOLUTE COUNT: 6.9 10*9/L (ref 2.0–7.5)
NEUTROPHILS RELATIVE PERCENT: 83.7 %
PLATELET COUNT: 57 10*9/L — ABNORMAL LOW (ref 150–440)
RED BLOOD CELL COUNT: 3.19 10*12/L — ABNORMAL LOW (ref 4.50–5.90)
RED CELL DISTRIBUTION WIDTH: 26.7 % — ABNORMAL HIGH (ref 12.0–15.0)
WBC ADJUSTED: 8.3 10*9/L (ref 4.5–11.0)

## 2019-08-25 LAB — COMPREHENSIVE METABOLIC PANEL
ALBUMIN: 2.4 g/dL — ABNORMAL LOW (ref 3.5–5.0)
ALKALINE PHOSPHATASE: 643 U/L — ABNORMAL HIGH (ref 65–260)
AST (SGOT): 156 U/L — ABNORMAL HIGH (ref 19–55)
BLOOD UREA NITROGEN: 25 mg/dL — ABNORMAL HIGH (ref 7–21)
BUN / CREAT RATIO: 35
CALCIUM: 7.8 mg/dL — ABNORMAL LOW (ref 8.5–10.2)
CHLORIDE: 105 mmol/L (ref 98–107)
CREATININE: 0.71 mg/dL (ref 0.70–1.30)
EGFR CKD-EPI AA MALE: >90 mL/min/{1.73_m2} (ref >=60)
EGFR CKD-EPI NON-AA MALE: >90 mL/min/{1.73_m2} (ref >=60)
GLUCOSE RANDOM: 254 mg/dL — ABNORMAL HIGH (ref 70–179)
POTASSIUM: 3.7 mmol/L (ref 3.5–5.0)
SODIUM: 136 mmol/L (ref 135–145)

## 2019-08-25 LAB — GLUCOSE RANDOM: Glucose:MCnc:Pt:Ser/Plas:Qn:: 254 — ABNORMAL HIGH

## 2019-08-25 LAB — RED BLOOD CELL COUNT: Lab: 3.19 — ABNORMAL LOW

## 2019-08-25 LAB — MAGNESIUM
Magnesium:MCnc:Pt:Ser/Plas:Qn:: 1.4 — ABNORMAL LOW
Magnesium:MCnc:Pt:Ser/Plas:Qn:: 1.9

## 2019-08-25 LAB — PHOSPHORUS: Phosphate:MCnc:Pt:Ser/Plas:Qn:: 3.2

## 2019-08-25 LAB — PROTIME-INR: PROTIME: 19.6 s — ABNORMAL HIGH (ref 10.2–13.1)

## 2019-08-25 LAB — BILIRUBIN DIRECT: Bilirubin.glucuronidated+Bilirubin.albumin bound:MCnc:Pt:Ser/Plas:Qn:: 23.3 — ABNORMAL HIGH

## 2019-08-25 LAB — PROTIME: Coagulation tissue factor induced:Time:Pt:PPP:Qn:Coag: 19.6 — ABNORMAL HIGH

## 2019-08-25 LAB — GAMMA GLUTAMYL TRANSFERASE: Gamma glutamyl transferase:CCnc:Pt:Ser/Plas:Qn:: 418 — ABNORMAL HIGH

## 2019-08-25 NOTE — Unmapped (Signed)
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 Patients who are being treated with steroids often develop Pityrosporum folliculitis, so would additionally recommend continuing ketoconazole shampoo.   - Recommend clindamycin 1% lotion daily to face, chest, back  - Would also recommend starting benzoyl peroxide gel 5% daily. OK to use on all affected areas, however please make patient aware that it will bleach clothing.   - Recommend continuing ketoconazole shampoo to face, chest, back daily   - A scraping of representative lesions was performed and examined microscopically using a chlorazol black preparation: Positive for fungal hyphae and spores    Irritant contact dermatitis, hands:  - Recommend ammonium lactate 12% lotion twice daily to hands    Impetigo of the R superior auricular sulcus, crusted papule on the L neck   - Lesions improving with application of topical mupirocin, no indication for biopsy at this time  - Continue topical mupirocin BID to these areas until healed then stop  - Prior aerobic culture with 1+ MSSA    Thank you for the consult. Dermatology will sign off. Please page [phone] should any additional questions arise or change in patients status requiring re-evaluation.    Subjective:     Team called back for continued rash on chest, abdomen, face and back. Wonders if this could be associated with his antibiotics. Per chart review, pt was started on ceftriaxone and flagyl 10/13 for strep mitis bacteremia.     Per patient, rash feels similar to rash he has previously. Reports alternating between ketoconazole and BPO has made his rash feel much better in his prior hospitalization. Has not been able to start it during this admission. Currently on systemic steroids.    ROS:   A balance of 10 systems was reviewed and negative.     Objective:     Vitals:    08/25/19 1034 08/25/19 1106 08/25/19 1115 08/25/19 1130   BP: 116/75 120/71 128/80 123/82   Pulse: 67 79  78   Resp: 18 18  18    Temp: 36.9 ??C 37.1 ??C  36.9 ??C   TempSrc: Oral Oral  Oral   SpO2: 100% 100% 100% 100%   Weight:       Height:           Temp (24hrs), Avg:36.9 ??C, Min:36.4 ??C, Max:37.1 ??C      PHYSICAL EXAM:  GEN: Well-appearing in NAD  HEENT: +Sceral icterus  NEURO: Alert and oriented  SKIN: Examination of the scalp, face, neck, chest, upper back, bilateral upper extremities including palms, soles, and nails was performed today and unremarkable except as below:  - There are numerous, folliculocentric monomorphic erythematous papules and pustules on the V of chest, upper back, forehead, nose, cheeks  - Erosion with overlying hemorrhagic crusting on the R superior auricular sulcus  - Xerosis and lichenification of b/l palmar hands  - All other areas not specifically commented on are within normal limits.

## 2019-08-25 NOTE — Unmapped (Signed)
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 Along with steroids, tacrolimus and cellcept, thyroglobulin treatment was initiated (on 10/01) but yielded limited benefit in liver studies to date. Patient has discussed prognosis with Dr. Melrose Nakayama and palliative care (10/9). Patient understands that transplant is an option but that he may not be a candidate. His active issues are a tenuous fluid balance, showing ascites and pitting edema on exam. He has steroid-induced hyperglycemia, for which we have consulted adult Endocrinology and started meal coverage and sliding scale lispro. His R ear and L posterior neck lesions are improving with Mupirocin.  On 10/12 patient developed leukocytosis concerning for infection, and blood cultures positive for GPC's and is currently on antibiotics. As a result of bacteremia, liver biopsy was canceled and immunosuppression therapy was held.     #Hx Liver Transplant - Subacute/Acute Liver Failure: ??Pt has biopsy confirmed severe acute rejection (9/25), RAI 9/9. Labs today essentially unchanged w/out robust response to Thymo, however, overall trend of AST and alk phos during this admission has been downward a bit.   - tranplant team recommending liver biopsy with paracentesis at that time, but this was delayed due to concern for bacteremia. Plan to reschedule once bacteremia adequately addressed  - Will continue to hold immunosuppressive therapy    - DC'd Tacro and Mycophenolate   - S/p 12 days of ATG and high dose MethylPrednisolone (10/1-10/12) with poor overall repsonse to therapy.    - Tapering methylpred: 80mg  tomorrow   - Continue Valgancyclovir 450mg  daily (3months) and Bactrim MWF (43mo) for prophylaxis (10/2-)  - Continue on Nystatin 10ml TID (81mo) prophylaxis (10/2-)  - PT/INR every other day. INR 1.51 today--giving vit K1 7.5mg  IV   - Protonix 40 mg PO daily  - Consider albumin infusion for hypoalbuminemia prn   - Continue Rifaximin     #Strep mitis Bacteremia  Developed sudden leukocytosis with white count elevated to 17.3 10/12, afebrile, non-toxic appearing, without localizing symptoms.  Differential included SBP or other source of infection.  Lung sounds noted to right lower lobe but CXR showed only atelectasis.  UA unremarkable. BCx positive for GPC's in chains overnight 10/12 -10/13, started on empiric broad-spectrum antibiotics.  Unknown source of infection.   - Peripheral Blood culture x2 on 10/13, Ordered PICC line Cx on 10/14  - 10/13 Cx grew Staph aureus on 10/15. Restarted Vanc (10/15-)   - Continuing ctx and flagyl  - Currently planning to treat through PICC line infection. Will consider pulling PICC in future   - Echo negative for endocarditis     #Edema, ascites   Moderate ascites still present. Noted as large volume ascites on MRCP.  Concern that patient could quickly become fluid overloaded, will monitor fluid status closely  -  Following daily weights  - Encouraging PO intake (>1.6L of fluids PO/day)   - STRICT I/Os      #Nausea/emesis  - Zofran??q8 hours PO PRN    #Nutrition  - Regular diet  - Glucerna or Ensure supplements   - Ergocalciferol qMon   - aquadex multivitamin    - Daily CMP, Mg, Ph    #Hyperglycemia   - Lantus 10 U nightly; will half if being made NPO. Continue 4 units TIDAC lispro w/meals.   - BG still elevated but improved today. Patient has agreed to be more compliant with insulin regimen. Took lantus last night and humalog with breakfast today  - PRN dose for overnight meal; if a normal sized meal give 4 units, if smaller meal give 2 units  - continue sensitive lispro sliding scale with meals and in the evenings  - glucose checks qAC (including nighttime meal)    #Thrombocytopenia  - s/p plt transfusion on 10/13  - Low plt count again on 10/15 (57). Giving second platelet transfusion today      #Skin Lesions on Right Ear and Left posterior Neck, culture +staph aureus   - Derm consulted. Appreciate recs   - Mupirocin ointment for both lesions until healed, per derm  - Ordered topical clindamycin, benzoyl peroxide today for folliculitis on chest/back. Ketoconazole shampoo    ??  #Major Depressive Disorder, insomnia: Stable. Has hx of attempted overdoses, with the last attempt in April 2019??at which point he was hospitalized in the Adolescent Psychiatry unit and discharged on Lexapro 20 mg. ??He has not engaged with mental health resources or taken medication for depression or insomnia in 8-10 months.????Denies SI, passive suicidality, self-harm, or HI at this time.    - Patient saw adult psychiatry team again on 10/13. Offered SSRI, pt considering   - melatonin 5 mg 1 hour before bedtime    #Constipation  -Continue Miralax 17 g BID PRN     #Deconditioning. Patient endorses feeling weak and having difficulty in moving around.  He has been working hard to get out of bed and do exercises in his room.  -PT following  -Encourage OOB and ambulation  ??  #Severe Malnutrition:????Prior to admission, patient has been skipping meals due to poor appetite and food insecurity. Albumin persistently low in setting of liver disease.  - Nutrition consult, appreciate recs  - Thiamine   - Multivitamin, Ca 300 mg BID, vit D 1000u QD   - Monitor phosphorous level  - Low mag today. Will replete with 2g IV Mag sulphate today     #Homelessness - Social Concerns:??Duel has experienced homelessness for ~5 months when he left his mother's house. ??He has severed all ties with his family and states his relationship with his mother cannot be reconciled. ??He also requested contact information of family members to be erased from his chart (this has been completed, but social worker did drop contact info into her note on 9/24 in case he changes his mind during admission). ??In addition to food insecurity, he has not been able to secure medications. ??He is routinely exposed to alcohol and drugs (marijuana, cocaine, etc.) second-hand through his friendships though he denies current use himself. ??On urine drug screen, he tested positive for marijuana only.  -Supportive care following. Planning to name surrogate decision maker if he is incapacitated (likely a close friend)  -States the he would like to designate a personal friend [name]) and/or grandfather to be his HCPOA. Planning to hold phone conversation today   ??  Access: PICC (placed 9/28)  ??  Discharge criteria:??liver transplant work-up and treatment of acute rejection    Subjective:     Interval History:   Patient lying in bed. Eating breakfast during today's interview. Conversational, but with flat affect. States he feels well.     Objective:     Vital signs in last 24 hours:  Temp:  [36.4 ??C-37.1 ??C] 36.9 ??C  Heart Rate:  [65-88] 78  Resp:  [18-20] 18  BP: (103-128)/(52-82) 123/82  MAP (mmHg):  [67-95] 95  SpO2:  [100 %] 100 %  Intake/Output last 3 shifts:  I/O last 3 completed shifts:  In: 5508.9 [P.O.:3090; I.V.:2054.9; Blood:364]  Out: 6120 [Urine:6120]    Physical Exam:  General:   Laying in bed, awake, very thin and weak appearing  Head: normocephalic, atraumatic.  Ears: Skin lesion present on right ear  Eyes: EOMI. Significant scleral icterus present.   Nose:   clear, no discharge  Lungs: clear breath sounds bilaterally. No increased work of breathing. No wheezes/crackles.  Heart: regular rate and rhythm. No murmurs appreciated on auscultation.  Abdomen:  Abdomen soft, distended, soft, mild TTP in RUQ and RLQ. Ascites present  Neuro: deconditioned, moves all extremities with decreased strength  Extremities:. Pitting edema in the LE, WWP  Skin: Skin lesions on left neck, healing well. Jaundiced throughout. Acneform rash present on face, back, chest, appears to be worsening.     Active Medications reviewed and KEY Medications include:     Current Facility-Administered Medications:   ???  benzoyl peroxide 5 % gel, , Topical, Nightly, Val Eagle  ???  calcium carbonate (OS-CAL) tablet 300 mg elem calcium, 300 mg elem calcium, Oral, BID, David Stall, MD, 300 mg elem calcium at 08/25/19 1610  ???  cefTRIAXone (ROCEPHIN) 2 g in sodium chloride 0.9 % (NS) 100 mL IVPB-connector bag, 2 g, Intravenous, Q24H, David Stall, MD, Last Rate: 200 mL/hr at 08/25/19 0514, 2 g at 08/25/19 0514  ???  clindamycin (CLEOCIN T) 1 % lotion, , Topical, daily, Val Eagle  ???  dextrose 50 % in water (D50W) 50 % solution 12.5 g, 12.5 g, Intravenous, Q10 Min PRN, David Stall, MD  ???  ergocalciferol (DRISDOL) capsule 50,000 Units, 50,000 Units, Oral, Weekly, David Stall, MD, 50,000 Units at 08/22/19 0848  ???  heparin preservative-free injection 10 units/mL syringe (HEPARIN LOCK FLUSH), 20 Units, Intravenous, Daily PRN, David Stall, MD, 20 Units at 08/23/19 1300  ???  influenza vaccine quad (FLUARIX, FLULAVAL, FLUZONE) (6 MOS & UP) 2020-21, 0.5 mL, Intramuscular, During hospitalization, David Stall, MD  ???  insulin glargine (LANTUS) injection 10 Units, 10 Units, Subcutaneous, Nightly, David Stall, MD, 10 Units at 08/24/19 2118  ???  insulin lispro (HumaLOG) injection 0-5 Units, 0-5 Units, Subcutaneous, ACHS, David Stall, MD, 2 Units at 08/25/19 1108  ???  insulin lispro (HumaLOG) injection 2-4 Units, 2-4 Units, Subcutaneous, Nightly PRN, David Stall, MD  ???  insulin lispro (HumaLOG) injection 4 Units, 4 Units, Subcutaneous, TID AC, David Stall, MD, 4 Units at 08/25/19 1108  ???  [START ON 08/26/2019] ketoconazole (NIZORAL) 2 % shampoo, , Topical, daily, Alfredia Ferguson, MD  ???  magnesium sulfate dilution 100 mg/mL (CENTRAL LINE) injection 2,000 mg, 50 mg/kg, Intravenous, Once, Melba Coon, MD  ???  melatonin tablet 6 mg, 6 mg, Oral, QPM, David Stall, MD, 6 mg at 08/24/19 2122  ???  methylPREDNISolone sodium succinate (PF) (Solu-MEDROL) injection 100  mg, 100 mg, Intravenous, Once, Sallyanne Havers, MD  ???  methylPREDNISolone sodium succinate (PF) (Solu-MEDROL) injection 80 mg, 80 mg, Intravenous, Daily, Alfredia Ferguson, MD, 80 mg at 08/25/19 1313  ???  metroNIDAZOLE (FLAGYL) IVPB 500 mg, 500 mg, Intravenous, Q8H, David Stall, MD, Last Rate: 100 mL/hr at 08/25/19 1200, 500 mg at 08/25/19 1200  ???  multivitamin, with zinc (AQUADEKS) chewable tablet, 2 tablet, Oral, Daily, David Stall, MD, 2 tablet at 08/25/19 1610  ???  mupirocin (BACTROBAN) 2 % ointment, , Topical, TID, David Stall, MD  ???  nystatin (MYCOSTATIN) oral suspension, 1,000,000 Units, Oral, TID, David Stall, MD, 1,000,000 Units at 08/25/19 9604  ???  ondansetron (ZOFRAN) injection 4 mg, 4 mg, Intravenous, Once, David Stall, MD  ???  ondansetron (ZOFRAN-ODT) disintegrating tablet 4 mg, 4 mg, Oral, Q8H PRN, David Stall, MD  ???  pantoprazole (PROTONIX) EC tablet 40 mg, 40 mg, Oral, Daily, David Stall, MD, 40 mg at 08/24/19 2122  ???  phytonadione (vitamin K1) (AQUA-MEPHYTON) 7.5 mg in sodium chloride (NS) 0.9 % 50 mL IVPB, 7.5 mg, Intravenous, Daily, Lewie Loron, MD, Last Rate: 173.3 mL/hr at 08/25/19 0945, 7.5 mg at 08/25/19 0945  ???  polyethylene glycol (MIRALAX) packet 17 g, 17 g, Oral, BID PRN, David Stall, MD  ???  rifAXIMin (XIFAXAN) tablet 550 mg, 550 mg, Oral, BID, David Stall, MD, 550 mg at 08/25/19 5409  ???  sodium chloride (NS) 0.9 % infusion, , Intravenous, Continuous, David Stall, MD, Last Rate: 10 mL/hr at 08/24/19 1928, 1,000 mL at 08/24/19 1928  ???  sulfamethoxazole-trimethoprim (BACTRIM) 400-80 mg tablet 80 mg of trimethoprim, 1 tablet, Oral, Q MWF, David Stall, MD, 80 mg of trimethoprim at 08/24/19 0845  ???  valGANciclovir (VALCYTE) tablet 450 mg, 450 mg, Oral, Daily, David Stall, MD, 450 mg at 08/24/19 1722        Studies: Personally reviewed and interpreted.  Labs/Studies:  Labs and Studies from the last 24hrs per EMR and Reviewed and   All lab results last 24 hours:    Recent Results (from the past 24 hour(s))   Comprehensive Metabolic Panel    Collection Time: 08/25/19  5:15 AM   Result Value Ref Range    Sodium 136 135 - 145 mmol/L    Potassium 3.7 3.5 - 5.0 mmol/L    Chloride 105 98 - 107 mmol/L    Anion Gap      CO2      BUN 25 (H) 7 - 21 mg/dL    Creatinine 8.11 9.14 - 1.30 mg/dL    BUN/Creatinine Ratio 35     EGFR CKD-EPI Non-African American, Male >90 >=60 mL/min/1.34m2    EGFR CKD-EPI African American, Male >90 >=60 mL/min/1.14m2    Glucose 254 (H) 70 - 179 mg/dL    Calcium 7.8 (L) 8.5 - 10.2 mg/dL    Albumin 2.4 (L) 3.5 - 5.0 g/dL    Total Protein      Total Bilirubin 28.9 (H) 0.0 - 1.2 mg/dL    AST 782 (H) 19 - 55 U/L    ALT      Alkaline Phosphatase 643 (H) 65 - 260 U/L   Gamma GT (GGT)    Collection Time: 08/25/19  5:15 AM   Result Value Ref Range    GGT 418 (H) 12 - 109 U/L   Magnesium Level    Collection Time: 08/25/19  5:15 AM  Result Value Ref Range    Magnesium 1.4 (L) 1.6 - 2.2 mg/dL   Phosphorus Level    Collection Time: 08/25/19  5:15 AM   Result Value Ref Range    Phosphorus 3.2 2.9 - 4.7 mg/dL   PT-INR    Collection Time: 08/25/19  5:15 AM   Result Value Ref Range    PT 19.6 (H) 10.2 - 13.1 sec    INR 1.69    Bilirubin, Direct    Collection Time: 08/25/19  5:15 AM   Result Value Ref Range    Bilirubin, Direct 23.30 (H) 0.00 - 0.40 mg/dL   CBC w/ Differential    Collection Time: 08/25/19  5:15 AM   Result Value Ref Range    WBC 8.3 4.5 - 11.0 10*9/L    RBC 3.19 (L) 4.50 - 5.90 10*12/L    HGB 8.6 (L) 13.5 - 17.5 g/dL    HCT 45.4 (L) 09.8 - 53.0 %    MCV 82.6 80.0 - 100.0 fL    MCH 26.9 26.0 - 34.0 pg    MCHC 32.6 31.0 - 37.0 g/dL    RDW 11.9 (H) 14.7 - 15.0 %    MPV 7.3 7.0 - 10.0 fL    Platelet 57 (L) 150 - 440 10*9/L    Neutrophils % 83.7 %    Lymphocytes % 13.2 %    Monocytes % 2.4 %    Eosinophils % 0.4 %    Basophils % 0.1 %    Neutrophil Left Shift 1+ (A) Not Present    Absolute Neutrophils 6.9 2.0 - 7.5 10*9/L    Absolute Lymphocytes 1.1 (L) 1.5 - 5.0 10*9/L    Absolute Monocytes 0.2 0.2 - 0.8 10*9/L    Absolute Eosinophils 0.0 0.0 - 0.4 10*9/L    Absolute Basophils 0.0 0.0 - 0.1 10*9/L    Large Unstained Cells 0 0 - 4 %    Microcytosis Marked (A) Not Present    Macrocytosis Slight (A) Not Present    Anisocytosis Marked (A) Not Present    Hypochromasia Slight (A) Not Present   Prepare Platelet Pheresis    Collection Time: 08/25/19  7:01 AM   Result Value Ref Range    Unit Blood Type A Pos     ISBT Number 6200     Unit # [id number]     Status Transfused     Product ID Platelets     PRODUCT CODE [id number]    Prepare Platelet Pheresis    Collection Time: 08/25/19 10:54 AM   Result Value Ref Range    Unit Blood Type B Pos     ISBT Number 7300     Unit # [id number]     Status Ready     Product ID Platelets     PRODUCT CODE [id number]      ========================================    Val Eagle, MD  Anesthesiology, PGY-1

## 2019-08-25 NOTE — Unmapped (Signed)
Pediatric Vancomycin Therapeutic Monitoring Pharmacy Note    [name] Gross is a 18 y.o. male starting vancomycin. Date of therapy initiation: 08/25/2019    Indication: Bacteremia/Sepsis    Prior Dosing Information: Previous regimen (as of yesterday) 15 mg/kg every 8 hours     Dosing Weight: 60.4 kg    Goals:  Therapeutic Drug Levels  Vancomycin trough goal: 10-15 mg/L    Additional Clinical Monitoring/Outcomes  Renal function, volume status (intake and output)    Results: Not applicable    Wt Readings from Last 1 Encounters:   08/24/19 60.4 kg (133 lb 2.5 oz) (17 %, Z= -0.97)*     * Growth percentiles are based on CDC (Boys, 2-20 Years) data.     Creatinine   Date Value Ref Range Status   08/25/2019 0.71 0.70 - 1.30 mg/dL Final   13/06/6577 4.69 0.70 - 1.30 mg/dL Final   62/95/2841 3.24 0.70 - 1.30 mg/dL Final        UOP: 1.9 mL/kg/hr    Pharmacokinetic Considerations and Significant Drug Interactions:  ? Dosed per pediatric guideline  ? Concurrent nephrotoxic meds: not applicable    Assessment/Plan:  Recommendation(s)  ? Start vancomycin 906 mg (15 mg/kg) IV every 8 hours    Follow-up  ? Next level ordered: 10/16 at 0700  ? A pharmacist will continue to monitor and order levels as appropriate    Please page service pharmacist with questions/clarifications.    Kelvin Cellar, PharmD

## 2019-08-25 NOTE — Unmapped (Signed)
Pediatric Infectious Disease Progress Note  I attest that I have reviewed the student note and that the components of the history of the present illness, the physical exam, and the assessment and plan documented were performed by me or were performed in my presence by the student where I verified the documentation and performed (or re-performed) the exam and medical decision making.  Pt not feeling that great at the time of my exam . Appetite decreased and had just vomited.  New growth of S. Aureus from cultures obtained on 10/12. Vancomyicn  Had been discontinued  on 10 14 but will be restarted today until sensitivities are known.Of note pt grew MSSA from a skin lesion on 08/15/19 . It is possible as well that with his intense immunosuppression, acne that the skin could be source of bacteremia.At this time, no active lesions  on skin concerning for infection. Since unable to get paracentesis at [facility] time and with continued abdominal pain and now vomiting, continuing to treat as presumed SBP    ASSESSMENT  [name] Gross is a 18 y.o. male with leukocytosis and strep mitis bacteremia in the setting of acute liver transplant rejection/failure. Abdoulie has been hospitalized with culture-confirmed acute rejection since 9/24, and has been receiving high-dose immunosuppression with thymoglobulin, steroids, tacrolimus, and mycophenolate, with only modest improvement of his liver function. Ascites has increased throughout this time as well. On 10/12 he was found to have leukocytosis without any localizing symptoms of infections, including no fever, no new/changed abdominal pain, and no N/V/D. Broad coverage was initiated with ceftriaxone, flagyl, micafungin, and vancomycin. Peripheral as well as central line blood cultures have been positive for strep mitis, and now staph aureus which suggests a systemic source. Although the patient does have a PICC in place (since 9/28), it is also possible that there is a systemic source of infection, including SBP, as strep mitis is a known causative agent of SBP in patients with liver failure. However, given that a paracentesis would required sedation or anesthesia, at this time we can continue monitoring closely while on ceftriaxone and flagyl. In addition, a bacterial peritonitis would likely be monomicrobial and covered by the current antibiotic regimen. May also leave the PICC in place and treat through the line, given the difficulty of obtaining alternative access at this time.     Of note, today the peripheral blood culture from 10/12 has become positive for staph aureus, susceptibilities pending. Given this is from a peripheral draw and S.aureus it is unlikely to represent a contaminant, though late growth  ( > 48 hrs).Will need to restart vancomycin and draw cultures today from the central line and peripherally.     RECOMMENDATIONS  -Ceftriaxone 2g q24h to cover for strep mitis bacteremia. Will follow clinical course to determine antibiotic duration. This will also provide additional broad-spectrum gram-negative coverage for intra-abdominal process. (10/13- )  -Continue Flagyl 500mg  q8h, to provide additional coverage of intra-abdominal process given difficulty of paracentesis at this time. (10/13- )  -Restart vancomycin 1000 mg q12h to cover newly positive staph aureus (10/13-10/14, 10/15- ). Susceptibilities pending.     -Given difficulty of paracentesis, closely monitor for signs of bacterial peritonitis including fever, worsening abdominal pain, leukocytosis, altered mentation, or diarrhea.   -May continue treating through the PICC line, no need to discontinue unless new positive cultures or clinical worsening.   -Repeat bcx until confirmed negative: repeat peripheral and central line cx today 10/15.     Thank you for asking [place] to  see Tanya Q [name]. We will continue to follow.   Merlinda Frederick, MS4  Pediatric Infectious Diseases    SUBJECTIVE  Interval History: Torres is alert and feeling well this morning. He had a good dinner and is feeling hungy for breakfast. He did have diarrhea overnight, without changed abdominal pain, nausea, vomiting, or decreased appetite. He denies new symptoms such as cough, headache, or new abdominal pain. He did receive plts this morning. Remains afebrile. Stool x 2, adequate UOP. Blood cx from 10/13 remain no growth.     History provided by patient, primary team, review of records.    Current antibiotics:  Anti-infectives (From admission, onward)    Start     Dose/Rate Route Frequency Ordered Stop    08/23/19 0200  metroNIDAZOLE (FLAGYL) IVPB 500 mg      500 mg  100 mL/hr over 60 Minutes Intravenous Every 8 hours 08/23/19 0121 08/30/19 0459    08/23/19 0200  cefTRIAXone (ROCEPHIN) 2 g in sodium chloride 0.9 % (NS) 100 mL IVPB-connector bag      2 g  200 mL/hr over 30 Minutes Intravenous Every 24 hours 08/23/19 0121 08/30/19 0559    08/22/19 2100  rifAXIMin (XIFAXAN) tablet 550 mg      550 mg Oral 2 times a day (standard) 08/22/19 1417 09/21/19 2059    08/15/19 0900  sulfamethoxazole-trimethoprim (BACTRIM) 400-80 mg tablet 80 mg of trimethoprim      1 tablet Oral Every Mon-Wed-Fri 08/13/19 0924 02/13/20 0859    08/12/19 1515  valGANciclovir (VALCYTE) tablet 450 mg      450 mg Oral Daily (standard) 08/12/19 1512 11/09/19 1659    08/12/19 1500  nystatin (MYCOSTATIN) oral suspension      1,000,000 Units Oral 3 times a day (standard) 08/12/19 1453 09/11/19 1359        Other medications reviewed    OBJECTIVE  Today pt repptys that his appetite is not good and one episode of vomiting.  No fevers  Vital signs in last 24 hours:  Temp:  [36.4 ??C (97.6 ??F)-37.1 ??C (98.8 ??F)] 36.9 ??C (98.5 ??F)  Heart Rate:  [65-88] 78  Resp:  [18-20] 18  BP: (103-128)/(52-82) 123/82  MAP (mmHg):  [67-95] 95  SpO2:  [100 %] 100 %    Physical Exam:  Constitutional: Laying in bed, alert, interactive, pleasant. Appears more energetic/comfortable this morning, speech is clear.  Head: Normocephalic atraumatic  Eyes: Icteric sclera, no discharge  Ears/Nose/Mouth/Throat: No nasal discharge.   Neck: Supple, full ROM.   Respiratory: Clear to auscultation, good air entry in all fields. No wheezing, crackles or rhonchi, breathing unlabored.   Cardiovascular: Regular rate and rhythm, normal S1/S2, no murmurs appreciated. Peripheral perfusion intact. Edema of lower extremities bilaterally.   Gastrointestinal: Distended but soft, no fluid wave elicited. Tenderness to deeper palpation of LUQ and RUQ. Difficult to appreciate hepatomegaly given ascites. No other masses appreciated.   Neurologic: Alert, oriented. Speech is clear. CN II-XII grossly intact. Mild tremor of hands improved from previous. No asterixis. Sensation, motor intact.   Musculoskeletal: Extremities warm and well-perfused, moves all extremities equally. Edema of LE bilaterally.   Skin: Significant jaundice throughout. 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 fine rash hyperpigmented rash on skin  Psychiatric: Appropriate for age, pleasant, interactive.     Labs:  Labs:  WBC 9.2-->8.3  Neuts 88.9%-->83.7%  ANC 8200-->6900  Hgb 8.6, plt 57  Na 136, K 3.7, Cl 105, BUN 25, Cr 0.71  Glucose 317-->254  Ca 7.8,  Mg 1.4, Phos 3.2  Alb 2.4  Tbili 28.9, dbili 23.3  AST 156, AP 643, GGT 418  PT/INR 19.6/1.69    Microbiology:    Culture results reviewed:  10/12 bcx central line: GPC in chains, strep mitis +  10/12 bcx peripheral: GPC in chains, strep mitis +. Staph Aureus  10/13 bcx peripheral 1: NG x 24 hrs  10/13 bcx peripheral 2: NG x 24 hrs  10/14 bcx central line: pending     Imaging:   MRI abdomen 10/9: The central hepatic bile ducts appear slightly prominent and this slightly increased compared to prior. Otherwise no overt dilatation of the biliary ductal system. The common bile duct is normal caliber. No obvious stenosis or stricture is identified. No obvious bile duct wall enhancement is seen. Heterogeneous enhancement of the liver. There is abnormal mildly high T2 signal along the portal triads. These findings are not specific but could be secondary to hepatocellular injury including rejection, acute or chronic liver disease or cholangitis. Porta hepatis lymph nodes measuring up to 1.0 cm are also identified (series 15 image 40) prominent portacaval lymph nodes are also seen. These are likely reactive. Unchanged dilatation of the right and left portal vein which is more prominent on the right side measuring up to 2.2 cm. Large volume ascites. Mild wall thickening of small bowel loops. This is not specific and could be secondary to edema/ascites. Subcutaneous questionable emphysema in the right flank, likely from recent liver biopsy. Anasarca.  CXR 10/12: New right basilar atelectasis versus airspace disease and trace right pleural effusion.  ECHO 10/14:    1. No intracardiac vegetations visualized.   2. Trivial mitral valve insufficiency.   3. Central line visualized with distal tip in right atrium.   4. Normal left ventricular cavity size and systolic function.   5. Normal right ventricular cavity size and systolic function.   6. Small posterior pericardial effusion.   7. Right sided pleural effusion.  I personally spent 25 minutes on the floor or unit in direct patient care. The direct patient care time included face-to-face time with the patient, reviewing the patient's chart, communicating with the family and/or other professionals and coordinating care. Greater than 50% of this time was spent in counseling or coordinating care with the patient regarding possible SBP and syrep mitis and s.aureus infection.

## 2019-08-25 NOTE — Unmapped (Signed)
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 He agreed to our team that he would take insulin and check sugars.  ??  Plan  - Lantus 10U nightly, half dose if NPO  -??Lispro??4units TIDAC with meals??  -????PRN Lispro dose for overnight meal: if a normal sized meal give 4 units, if smaller give 2 units lispro  - Continue sensitive sliding scale QACHS     ??  Seen and discussed with Dr. Marcello Fennel??  ??  Primary team informed of recommendations. We will continue to follow patient as needed. Please call/page [phone] with questions or concerns.     Elpidio Galea, MD  PGY5 Endocrinology Fellow    I saw and evaluated the patient, participating in the key portions of the service.  I reviewed the resident???s note.  I agree with the resident???s findings and plan.     Thane Edu, MD, MPH  Attending - Endocrinology and Metabolism    Time spent reviewing chart 10 minutes  Time spent with fellow 10 minutes  Time spent at bedside 10 minutes      Subjective/24 hour events:    Took lantus 10U last night and rapid acting this morning. He is more amenable today      Objective: :  BP 118/74  - Pulse 84  - Temp 36.4 ??C (Oral)  - Resp 20  - Ht 174.6 cm (5' 8.74)  - Wt 60.4 kg (133 lb 2.5 oz)  - SpO2 100%  - BMI 19.81 kg/m??         General: chronically ill appearing, NAD, eating lunch  Eyes: EOMI  Lungs: non labored respirations on room air  Neuro: The patient was alert and appropriate for conversation.  Psych: Flat affect.      Test Results    Lab Results   Component Value Date    POCGLU 315 (H) 08/22/2019    POCGLU 202 (H) 08/22/2019    POCGLU 214 (H) 08/22/2019    POCGLU 238 (H) 08/21/2019    POCGLU 221 (H) 08/21/2019 Lab Results   Component Value Date    CREATININE 0.71 08/25/2019    NA 136 08/25/2019    K 3.7 08/25/2019    CL 105 08/25/2019    CO2  08/25/2019      Comment:      Specimen icteric.      ANIONGAP  08/25/2019      Comment:      Unable to calculate      CALCIUM 7.8 (L) 08/25/2019    ALBUMIN 2.4 (L) 08/25/2019

## 2019-08-25 NOTE — Unmapped (Signed)
Virgle' VSS, afebrile. PICC c/d/I w/ fluids infusing, labs drawn per order. Pt drinking well. Diarrhea x 1. Abdomen remains distended, bilateral lower lower extremities edematous. Pt refusing POC glucose checks. Pt was complaint with Lantus overnight. No one present at bedside. Will continue to monitor.         Problem: Adult Inpatient Plan of Care  Goal: Plan of Care Review  Outcome: Ongoing - Unchanged  Goal: Patient-Specific Goal (Individualization)  Outcome: Ongoing - Unchanged  Goal: Absence of Hospital-Acquired Illness or Injury  Outcome: Ongoing - Unchanged  Goal: Optimal Comfort and Wellbeing  Outcome: Ongoing - Unchanged  Goal: Readiness for Transition of Care  Outcome: Ongoing - Unchanged  Goal: Rounds/Family Conference  Outcome: Ongoing - Unchanged     Problem: Wound  Goal: Optimal Wound Healing  Outcome: Ongoing - Unchanged     Problem: Fall Injury Risk  Goal: Absence of Fall and Fall-Related Injury  Outcome: Ongoing - Unchanged     Problem: Self-Care Deficit  Goal: Improved Ability to Complete Activities of Daily Living  Outcome: Ongoing - Unchanged

## 2019-08-26 LAB — CBC W/ AUTO DIFF
BASOPHILS ABSOLUTE COUNT: 0 10*9/L (ref 0.0–0.1)
BASOPHILS RELATIVE PERCENT: 0.1 %
EOSINOPHILS ABSOLUTE COUNT: 0 10*9/L (ref 0.0–0.4)
EOSINOPHILS RELATIVE PERCENT: 0 %
LARGE UNSTAINED CELLS: 0 % (ref 0–4)
LYMPHOCYTES ABSOLUTE COUNT: 0.1 10*9/L — ABNORMAL LOW (ref 1.5–5.0)
LYMPHOCYTES RELATIVE PERCENT: 1.1 %
MEAN CORPUSCULAR HEMOGLOBIN CONC: 32.6 g/dL (ref 31.0–37.0)
MEAN CORPUSCULAR HEMOGLOBIN: 27.4 pg (ref 26.0–34.0)
MEAN CORPUSCULAR VOLUME: 84.1 fL (ref 80.0–100.0)
MEAN PLATELET VOLUME: 7.2 fL (ref 7.0–10.0)
MONOCYTES ABSOLUTE COUNT: 0.2 10*9/L (ref 0.2–0.8)
MONOCYTES RELATIVE PERCENT: 2.6 %
NEUTROPHILS ABSOLUTE COUNT: 5.9 10*9/L (ref 2.0–7.5)
NEUTROPHILS RELATIVE PERCENT: 95.7 %
PLATELET COUNT: 95 10*9/L — ABNORMAL LOW (ref 150–440)
RED CELL DISTRIBUTION WIDTH: 27 % — ABNORMAL HIGH (ref 12.0–15.0)
WBC ADJUSTED: 6.2 10*9/L (ref 4.5–11.0)

## 2019-08-26 LAB — COMPREHENSIVE METABOLIC PANEL
ALBUMIN: 2.5 g/dL — ABNORMAL LOW (ref 3.5–5.0)
ALKALINE PHOSPHATASE: 660 U/L — ABNORMAL HIGH (ref 65–260)
AST (SGOT): 106 U/L — ABNORMAL HIGH (ref 19–55)
BILIRUBIN TOTAL: 32.6 mg/dL — ABNORMAL HIGH (ref 0.0–1.2)
BUN / CREAT RATIO: 32
CALCIUM: 7.8 mg/dL — ABNORMAL LOW (ref 8.5–10.2)
CHLORIDE: 106 mmol/L (ref 98–107)
CREATININE: 0.73 mg/dL (ref 0.70–1.30)
EGFR CKD-EPI AA MALE: >90 mL/min/{1.73_m2} (ref >=60)
EGFR CKD-EPI NON-AA MALE: >90 mL/min/{1.73_m2} (ref >=60)
GLUCOSE RANDOM: 344 mg/dL — ABNORMAL HIGH (ref 70–179)
POTASSIUM: 4 mmol/L (ref 3.5–5.0)
SODIUM: 138 mmol/L (ref 135–145)

## 2019-08-26 LAB — PROTEIN TOTAL: Protein:MCnc:Pt:Ser/Plas:Qn:: 0

## 2019-08-26 LAB — BILIRUBIN DIRECT: Bilirubin.glucuronidated+Bilirubin.albumin bound:MCnc:Pt:Ser/Plas:Qn:: 29.8 — ABNORMAL HIGH

## 2019-08-26 LAB — VANCOMYCIN TROUGH: Vancomycin^trough:MCnc:Pt:Ser/Plas:Qn:: 14.9

## 2019-08-26 LAB — MAGNESIUM: Magnesium:MCnc:Pt:Ser/Plas:Qn:: 1.7

## 2019-08-26 LAB — GAMMA GLUTAMYL TRANSFERASE: Gamma glutamyl transferase:CCnc:Pt:Ser/Plas:Qn:: 427 — ABNORMAL HIGH

## 2019-08-26 LAB — WBC ADJUSTED: Leukocytes:NCnc:Pt:Bld:Qn:: 6.2

## 2019-08-26 LAB — PROTIME-INR: INR: 1.64

## 2019-08-26 LAB — PHOSPHORUS: Phosphate:MCnc:Pt:Ser/Plas:Qn:: 2.7 — ABNORMAL LOW

## 2019-08-26 LAB — PROTIME: Coagulation tissue factor induced:Time:Pt:PPP:Qn:Coag: 19.1 — ABNORMAL HIGH

## 2019-08-26 LAB — GAMMA GT: GAMMA GLUTAMYL TRANSFERASE: 427 U/L — ABNORMAL HIGH (ref 12–109)

## 2019-08-26 NOTE — Unmapped (Signed)
Pediatric Infectious Disease Progress Note  I attest that I have reviewed the student note and that the components of the history of the present illness, the physical exam, and the assessment and plan documented were performed by me or were performed in my presence by the student where I verified the documentation and performed (or re-performed) the exam and medical decision making.  ASSESSMENT  [name] Gross is a 18 y.o. male with leukocytosis and strep mitis/staph aureus bacteremia in the setting of acute liver transplant rejection/failure. [name] Gross has been hospitalized with culture-confirmed acute rejection since 9/24, and has been receiving high-dose immunosuppression with thymoglobulin, steroids, tacrolimus, and mycophenolate, with only modest improvement of his liver function. Ascites has increased throughout this time as well. On 10/12 he was found to have leukocytosis without any localizing symptoms of infections, including no fever, no new/changed abdominal pain, and no N/V/D. Broad coverage was initiated with ceftriaxone, flagyl, micafungin, and vancomycin. Peripheral as well as central line blood cultures have been positive for strep mitis, and now staph aureus, suggestive of a systemic source. The patient did have a culture from a R ear lesion positive for MSSA on 10/5. The source of staph aureus bacteremia might also be his steroid-induced acne, given his significantly immunosuppressed state. Although the staph aureus did have late growth (>48hrs), it is likely to represent a true infection, not a contaminant, given it was from a peripheral source (not the central line).     Although the patient does have a PICC in place (since 9/28), it is also possible that there is a systemic source  including SBP, as strep mitis is a known causative agent of SBP in patients with liver failure. However, given that a paracentesis would require sedation or anesthesia, at this time we can continue monitoring closely while on antibiotics  treatment. In addition, a bacterial peritonitis would likely be monomicrobial and covered by the current antibiotic regimen. May also leave the PICC in place and treat through the line, given the difficulty of obtaining alternative access at this time.     RECOMMENDATIONS    -May switch to antibiotic regimen of Unasyn only today (dc ceftriaxone, flagyl, vanc). Duration will be 14 days (10/13-10/27).   -If the patient continues to be afebrile with negative cultures, recommend continuing to hold immunosuppression for ten days of treatment (10/13-10/23).   -Agree with topical treatments of steroid-induced acne: ketoconazole shampoo, clindamycin 1% lotion, topical benzoyl peroxide, as well as topical bactroban to R ear lesion.   -Given difficulty of paracentesis, closely monitor for signs of bacterial peritonitis including fever, worsening abdominal pain, leukocytosis, altered mentation, or diarrhea.   -May continue treating through the PICC line, no need to discontinue unless new positive cultures or clinical worsening.   -Repeat bcx (peripheral and central) until confirmed negative.    Thank you for asking [place] to see [name] Gross. We will continue to follow.   Merlinda Frederick, MS4  Pediatric Infectious Diseases    SUBJECTIVE  Interval History: [name] Gross is alert and states he is feeling very well this morning. He was able to eat dinner and breakfast and endorses a good appetite without nausea. He has not had repeated vomiting since yesterday evening, and attributes the vomiting to emotional overwhelm. He is looking forward to his grandmother visiting today. Did receive plts overnight. Remains afebrile. BMs continue to be loose. UOP appropriate. Repeat bcx from 10/13 and 10/14 remain NGTD.     History provided by patient, primary team, review of  records.    Current antibiotics:  Anti-infectives (From admission, onward)    Start     Dose/Rate Route Frequency Ordered Stop    08/25/19 1500 vancomycin dilution (VANCOCIN) 5 mg/mL injection 910 mg      15 mg/kg ?? 60.4 kg (Adjusted)  182 mL/hr over 60 Minutes Intravenous Every 8 hours 08/25/19 1439 08/30/19 0859    08/23/19 0200  metroNIDAZOLE (FLAGYL) IVPB 500 mg      500 mg  100 mL/hr over 60 Minutes Intravenous Every 8 hours 08/23/19 0121 08/30/19 0459    08/23/19 0200  cefTRIAXone (ROCEPHIN) 2 g in sodium chloride 0.9 % (NS) 100 mL IVPB-connector bag      2 g  200 mL/hr over 30 Minutes Intravenous Every 24 hours 08/23/19 0121 08/30/19 0559    08/22/19 2100  rifAXIMin (XIFAXAN) tablet 550 mg      550 mg Oral 2 times a day (standard) 08/22/19 1417 09/21/19 2059    08/15/19 0900  sulfamethoxazole-trimethoprim (BACTRIM) 400-80 mg tablet 80 mg of trimethoprim      1 tablet Oral Every Mon-Wed-Fri 08/13/19 0924 02/13/20 0859    08/12/19 1515  valGANciclovir (VALCYTE) tablet 450 mg      450 mg Oral Daily (standard) 08/12/19 1512 11/09/19 1659    08/12/19 1500  nystatin (MYCOSTATIN) oral suspension      1,000,000 Units Oral 3 times a day (standard) 08/12/19 1453 09/11/19 1359        Other medications reviewed    OBJECTIVE    Vital signs in last 24 hours:  Temp:  [36.8 ??C (98.2 ??F)-37.1 ??C (98.7 ??F)] 37 ??C (98.6 ??F)  Heart Rate:  [62-79] 69  SpO2 Pulse:  [69-76] 69  Resp:  [18-20] 18  BP: (101-134)/(52-82) 134/73  MAP (mmHg):  [67-95] 91  SpO2:  [99 %-100 %] 100 %    Physical Exam:  Constitutional: Laying in bed, alert, interactive, pleasant. Energetic, comfortable.   Head: Normocephalic atraumatic  Eyes: Icteric sclera, no discharge  Ears/Nose/Mouth/Throat: No nasal discharge. Tongue and oropharynx icteric. Posterior oropharynx with white/pink plaques.   Neck: Supple, full ROM.   Respiratory: Clear to auscultation, good air entry in all fields. No wheezing, crackles or rhonchi, breathing unlabored.   Cardiovascular: Regular rate and rhythm, normal S1/S2, no murmurs appreciated. Peripheral perfusion intact. Edema of lower extremities bilaterally, unchanged. Gastrointestinal: Distended but soft, no fluid wave elicited. Abdomen appears more full than previous. Tenderness to deeper palpation of LUQ and RUQ. Difficult to appreciate hepatomegaly given ascites. No other masses appreciated.   Neurologic: Alert, oriented. Speech is clear. CN II-XII grossly intact. Mild tremor of hands stable from previous. Small amplitude asterixis vs tremor. Sensation, motor intact.   Musculoskeletal: Extremities warm and well-perfused, moves all extremities equally. Edema of LE bilaterally, nonpitting.   Skin: Significant jaundice throughout. 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 Chest, face with fine hyperpigmented vesicular vs papular rash. R ear dressed, dressing c/d/i. No new lesions, bruises noted.   Psychiatric: Appropriate for age, pleasant, interactive. Talkative and cheerful this morning.     Labs:  Labs:  WBC 9.2-->8.3-->6.2  ANC 8200-->6900-->5900  Hgb 8.8, plt 95  Na 138, K 4.0, Cl 106, BUN 23, Cr 0.73  Glucose 317-->254-->344  Ca 7.8, Mg 1.7, Phos 2.7  Alb 2.5  Tbili 32.6, dbili 29.8  AST 106, AP 660, GGT 427  PT/INR 19.1/1.64    Microbiology:    Culture results reviewed:  10/12 bcx central line: GPC in chains, strep mitis +  10/12 bcx peripheral: GPC in chains, strep mitis +, MSSA +  10/13 bcx peripheral 1: NG x 72 hrs  10/13 bcx peripheral 2: NG x 72 hrs  10/14 bcx central line: NG x 48 hrs  10/15 bcx peripheral: pending  10/15 bcx central line: pending     Imaging:   MRI abdomen 10/9: The central hepatic bile ducts appear slightly prominent and this slightly increased compared to prior. Otherwise no overt dilatation of the biliary ductal system. The common bile duct is normal caliber. No obvious stenosis or stricture is identified. No obvious bile duct wall enhancement is seen. Heterogeneous enhancement of the liver. There is abnormal mildly high T2 signal along the portal triads. These findings are not specific but could be secondary to hepatocellular injury including rejection, acute or chronic liver disease or cholangitis. Porta hepatis lymph nodes measuring up to 1.0 cm are also identified (series 15 image 40) prominent portacaval lymph nodes are also seen. These are likely reactive. Unchanged dilatation of the right and left portal vein which is more prominent on the right side measuring up to 2.2 cm. Large volume ascites. Mild wall thickening of small bowel loops. This is not specific and could be secondary to edema/ascites. Subcutaneous questionable emphysema in the right flank, likely from recent liver biopsy. Anasarca.  CXR 10/12: New right basilar atelectasis versus airspace disease and trace right pleural effusion.  ECHO 10/14:    1. No intracardiac vegetations visualized.   2. Trivial mitral valve insufficiency.   3. Central line visualized with distal tip in right atrium.   4. Normal left ventricular cavity size and systolic function.   5. Normal right ventricular cavity size and systolic function.   6. Small posterior pericardial effusion.   7. Right sided pleural effusion.  I personally spent 25 minutes on the floor or unit in direct patient care. The direct patient care time included face-to-face time with the patient, reviewing the patient's chart, communicating with the family and/or other professionals and coordinating care. Greater than 50% of this time was spent in counseling or coordinating care with the patient regarding s.mitis and s. aureus bacteremia.

## 2019-08-26 NOTE — Unmapped (Signed)
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 Along with steroids, tacrolimus and cellcept, thyroglobulin treatment was initiated (on 10/01) but yielded limited benefit in liver studies to date. Patient has discussed prognosis with Dr. Melrose Nakayama and palliative care (10/9). Patient understands that transplant is an option but that he may not be a candidate. His active issues are a tenuous fluid balance, showing ascites and pitting edema on exam. He has steroid-induced hyperglycemia, for which we have consulted adult Endocrinology and started meal coverage and sliding scale lispro. His R ear and L posterior neck lesions are improving with Mupirocin.  On 10/12 patient developed leukocytosis concerning for infection, and blood cultures positive for strep mitis and subsequently MSSA and is currently on Unasyn monotherapy. As a result of the bacteremia, his liver biopsy was canceled and immunosuppression therapy continues to be held.     #Hx Liver Transplant - Subacute/Acute Liver Failure: ??Pt has biopsy confirmed severe acute rejection (9/25), RAI 9/9. Labs today essentially unchanged w/out robust response to Thymo, however, overall trend of AST and alk phos during this admission has been downward a bit.   - tranplant team recommending liver biopsy with paracentesis at that time, but this was delayed due to concern for bacteremia. Plan to reschedule once bacteremia adequately addressed  - Will continue to hold immunosuppressive therapy    - Holding Tacro and Mycophenolate   - S/p 12 days of ATG and high dose MethylPrednisolone (10/1-10/12) with poor overall repsonse to therapy.    - Tapering methylpred: 80mg  again tomorrow    -  Dr. Melrose Nakayama advising on plan for methylpred taper. Likely will decrease to 60mg  tomorrow   - Continue Valgancyclovir 450mg  daily (3months) and Bactrim MWF (56mo) for prophylaxis (10/2-)  - Continue on Nystatin 10ml TID (65mo) prophylaxis (10/2-)  - PT/INR every other day. INR 1.64 today--giving vit K1 7.5mg  IV daily  - Protonix 40 mg PO daily  - Consider albumin infusion for hypoalbuminemia prn   - Continue Rifaximin     #Strep mitis and MSSA Bacteremia  Developed sudden leukocytosis with white count elevated to 17.3 10/12, afebrile, non-toxic appearing, without localizing symptoms.  Differential included SBP or other source of infection.  Lung sounds noted to right lower lobe but CXR showed only atelectasis.  UA unremarkable. BCx positive for GPC's in chains overnight 10/12 -10/13, started on empiric broad-spectrum antibiotics.    - 10/12 peripheral BCx positive for Strep mitis  - Ordered PICC line Cx on 10/12 + for strep mitis only   - Ordered repeat PICC line Cx on 10/14--NTD  - Echo negative for endocarditis   - WBC continues to decline   - 10/13 Cx subsequently grew Staph aureus on 10/15.  Confirmed MSSA on 10/16. Discontinued  Vanc (10/15-10/16)   - Per ID recs:   -Abx treatment x 14 days (10/13-10/27). Deescalated to Unasyn monotherapy today (10/16)    -Given unreliable PIV access, we will treat through his central line in attempt to salvage this PICC    -If afebrile and no new +BCx, negative Cx, may resume IS regimen s/p 10d of abx (10/13-10/23)   -Repeat bcx (peripheral and central) until confirmed negative     #Edema, ascites   Moderate ascites still present. Noted as large volume ascites on MRCP.  Concern that patient could quickly become fluid overloaded, will monitor fluid status closely  - Holding IVFs  - Following daily weights  - STRICT I/Os      #Hyperglycemia   -insulin/accucheck compliance remains an ongoing challange. We counseled the patient again today and he understands the importance of better glycemic control; he expressed willingness to take his insulin with every meal  -New Insulin regiment per endocrine recs (10/16)   - Lantus 15U nightly to take up some of the slack from missed meal time insulin (1/2 dose if NPO)   -??Lispro??4units TIDAC with meals??   -????PRN Lispro dose for overnight meal: if a normal sized meal give 4 units, if smaller give 2 units lispro   - Continue sensitive sliding scale QACHS    #FEN/GI  - Continue goal PO fluid intake of 1.6L   - Regular diet + Glucerna or Ensure supplements   - Miralax 17 g BID PRN  - Zofran??q8 hours PO PRN   - Ergocalciferol qMon   - aquadex multivitamin    - Daily CMP, Mg, Phos  - Low mag today. Will replete with 2g IV Mag sulphate today    #Thrombocytopenia  - s/p plt transfusion on 10/13  - Low plt count again on 10/15 (57). Giving second platelet transfusion today      #Skin Lesions on Right Ear and Left posterior Neck, culture +staph aureus   - Derm consulted. Appreciate recs   - Mupirocin ointment for both lesions until healed, per derm  - Ordered topical clindamycin, benzoyl peroxide today for folliculitis on chest/back. Ketoconazole shampoo    ??  #Major Depressive Disorder, insomnia: Stable. Has hx of attempted overdoses, with the last attempt in April 2019??at which point he was hospitalized in the Adolescent Psychiatry unit and discharged on Lexapro 20 mg. ??He has not engaged with mental health resources or taken medication for depression or insomnia in 8-10 months.????Denies SI, passive suicidality, self-harm, or HI at this time.    - Patient saw adult psychiatry team again on 10/13. Offered SSRI, pt still considering   - melatonin 5 mg 1 hour before bedtime    #Deconditioning. Patient endorses feeling weak and having limited mobility.   -PT following  -Encourage OOB and ambulation  ??  #Chronic Malnutrition (Preexisting):????Prior to admission, patient has been skipping meals due to poor appetite and food insecurity. Albumin persistently low in setting of liver disease.  - Nutrition consulted, appreciate recs  - Thiamine   - Multivitamin, Ca 300 mg BID, vit D 1000u QD      #Homelessness - Social Concerns:??[name] Gross has experienced homelessness for ~5 months when he left his mother's house. ??He has severed all ties with his family and states his relationship with his mother cannot be reconciled. ??He also requested contact information of family members to be erased from his chart (this has been completed, but social worker did drop contact info into her note on 9/24 in case he  changes his mind during admission). ??In addition to food insecurity, he has not been able to secure medications. ??He is routinely exposed to alcohol and drugs (marijuana, cocaine, etc.) second-hand through his friendships though he denies current use himself. ??On urine drug screen, he tested positive for marijuana only.  -Supportive care following. Planning to name surrogate decision maker if he is incapacitated (likely a close friend)  -Forensic scientist a personal friend [name]) and grandfather to be his HCPOAs (Friend is the Primary).   -Spoke with sister on 10/16 (has not been in regular contact)   -Paternal grandmother is planning to visit this afternoon     ??  Access: PICC (placed 9/28)  ??  Discharge criteria:??liver transplant work-up and treatment of acute rejection    Subjective:     Interval History:   Patient lying flat in bed. No complaints. Conversational, but with flat affect. Appeared more conversational and open to discussing his care and social issues than previous interviews.     Objective:     Vital signs in last 24 hours:  Temp:  [36.5 ??C-37.1 ??C] 36.8 ??C  Heart Rate:  [60-78] 60  SpO2 Pulse:  [69-76] 69  Resp:  [16-20] 18  BP: (97-134)/(68-82) 117/78  MAP (mmHg):  [78-95] 89  SpO2:  [99 %-100 %] 100 %  Intake/Output last 3 shifts:  I/O last 3 completed shifts:  In: 2764.9 [P.O.:1600; I.V.:840.9; Blood:324]  Out: 5050 [Urine:5050]    Physical Exam:  General:   Laying in bed, awake. Thin and weak appearing  Head: normocephalic, atraumatic.  Ears: Skin lesion present on right ear  Eyes: EOMI. Marked scleral icterus present.   Nose:   clear, no discharge  Lungs: clear breath sounds bilaterally. No increased work of breathing. No wheezes/crackles.  Heart: regular rate and rhythm. No murmurs appreciated on auscultation.  Abdomen:  Abdomen, soft, mild TTP throughout. Moderate Ascites present  Neuro: deconditioned, moves all extremities with decreased strength. Negative for asterixis   Extremities: Minimal pitting edema in the LE, WWP  Skin: Skin lesions on left neck, healing well. Jaundiced throughout. Acneform rash present on face, back, chest, appears to be worsening.     Active Medications reviewed and KEY Medications include:     Current Facility-Administered Medications:   ???  benzoyl peroxide 5 % gel, , Topical, Nightly, Alfredia Ferguson, MD  ???  calcium carbonate (OS-CAL) tablet 300 mg elem calcium, 300 mg elem calcium, Oral, BID, David Stall, MD, 300 mg elem calcium at 08/26/19 0915  ???  cefTRIAXone (ROCEPHIN) 2 g in sodium chloride 0.9 % (NS) 100 mL IVPB-connector bag, 2 g, Intravenous, Q24H, David Stall, MD, Last Rate: 200 mL/hr at 08/26/19 0538, 2 g at 08/26/19 0538  ???  clindamycin (CLEOCIN T) 1 % lotion 1 application, 1 application, Topical, daily, Alfredia Ferguson, MD  ???  dextrose 50 % in water (D50W) 50 % solution 12.5 g, 12.5 g, Intravenous, Q10 Min PRN, David Stall, MD  ???  ergocalciferol (DRISDOL) capsule 50,000 Units, 50,000 Units, Oral, Weekly, David Stall, MD, 50,000 Units at 08/22/19 0848  ???  heparin preservative-free injection 10 units/mL syringe (HEPARIN LOCK FLUSH), 20 Units, Intravenous, Daily PRN, David Stall, MD, 20 Units at 08/25/19 1838  ???  influenza vaccine quad (FLUARIX, FLULAVAL, FLUZONE) (6 MOS & UP) 2020-21, 0.5 mL, Intramuscular, During hospitalization, David Stall, MD  ???  insulin glargine (LANTUS) injection 15 Units, 15 Units, Subcutaneous, Nightly, David Stall, MD  ???  insulin lispro (HumaLOG) injection 0-5 Units, 0-5 Units, Subcutaneous, ACHS, David Stall, MD, 3 Units at 08/26/19 0800  ???  insulin lispro (HumaLOG) injection 2-4 Units, 2-4 Units, Subcutaneous, Nightly PRN, David Stall, MD  ???  insulin lispro (HumaLOG) injection 4 Units, 4 Units, Subcutaneous, TID AC, David Stall, MD, 4 Units at 08/26/19 0801  ???  ketoconazole (NIZORAL) 2 % shampoo, , Topical, daily, Alfredia Ferguson, MD  ???  melatonin tablet 6 mg, 6 mg, Oral, QPM, David Stall, MD, 6 mg at 08/25/19 2103  ???  methylPREDNISolone sodium succinate (PF) (Solu-MEDROL) injection 100 mg, 100 mg, Intravenous, Once, Sallyanne Havers, MD  ???  methylPREDNISolone sodium succinate (PF) (Solu-MEDROL) injection 80 mg, 80 mg, Intravenous, Daily, David Stall, MD, 80 mg at 08/25/19 1313  ???  metroNIDAZOLE (FLAGYL) IVPB 500 mg, 500 mg, Intravenous, Q8H, David Stall, MD, Last Rate: 100 mL/hr at 08/26/19 0425, 500 mg at 08/26/19 0425  ???  multivitamin, with zinc (AQUADEKS) chewable tablet, 2 tablet, Oral, Daily, David Stall, MD, 2 tablet at 08/26/19 0915  ???  mupirocin (BACTROBAN) 2 % ointment, , Topical, TID, David Stall, MD  ???  nystatin (MYCOSTATIN) oral suspension, 1,000,000 Units, Oral, TID, David Stall, MD, 1,000,000 Units at 08/26/19 0915  ???  ondansetron (ZOFRAN) injection 4 mg, 4 mg, Intravenous, Once, David Stall, MD  ???  ondansetron (ZOFRAN-ODT) disintegrating tablet 4 mg, 4 mg, Oral, Q8H PRN, David Stall, MD  ???  pantoprazole (PROTONIX) EC tablet 40 mg, 40 mg, Oral, Daily, David Stall, MD, 40 mg at 08/25/19 2138  ???  phytonadione (vitamin K1) (AQUA-MEPHYTON) 7.5 mg in sodium chloride (NS) 0.9 % 50 mL IVPB, 7.5 mg, Intravenous, Daily, Lewie Loron, MD, Last Rate: 173.3 mL/hr at 08/26/19 0945, 7.5 mg at 08/26/19 0945  ???  polyethylene glycol (MIRALAX) packet 17 g, 17 g, Oral, BID PRN, David Stall, MD  ???  rifAXIMin [employer]) tablet 550 mg, 550 mg, Oral, BID, David Stall, MD, 550 mg at 08/26/19 0914  ???  sodium chloride (NS) 0.9 % infusion, , Intravenous, Continuous, David Stall, MD, Last Rate: 10 mL/hr at 08/26/19 0537, 500 mL at 08/26/19 0537  ???  sulfamethoxazole-trimethoprim (BACTRIM) 400-80 mg tablet 80 mg of trimethoprim, 1 tablet, Oral, Q MWF, David Stall, MD, 80 mg of trimethoprim at 08/26/19 0915  ???  valGANciclovir (VALCYTE) tablet 450 mg, 450 mg, Oral, Daily, David Stall, MD, 450 mg at 08/25/19 1746        Studies: Personally reviewed and interpreted.  Labs/Studies:  Labs and Studies from the last 24hrs per EMR and Reviewed and   All lab results last 24 hours:    Recent Results (from the past 24 hour(s))   Magnesium Level    Collection Time: 08/25/19  6:38 PM   Result Value Ref Range    Magnesium 1.9 1.6 - 2.2 mg/dL   Prepare Platelet Pheresis    Collection Time: 08/25/19  7:50 PM   Result Value Ref Range    Unit Blood Type B Pos     ISBT Number 7300     Unit # [id number]     Status Released to Avail     Product ID Platelets     PRODUCT CODE [id number]    Prepare Platelet Pheresis    Collection Time: 08/25/19 10:13 PM   Result Value Ref Range    Unit Blood Type B Pos  ISBT Number 7300     Unit # [id number]     Status Issued     Product ID Platelets     PRODUCT CODE [id number]    Comprehensive Metabolic Panel    Collection Time: 08/26/19  5:31 AM   Result Value Ref Range    Sodium 138 135 - 145 mmol/L    Potassium 4.0 3.5 - 5.0 mmol/L    Chloride 106 98 - 107 mmol/L    Anion Gap      CO2      BUN 23 (H) 7 - 21 mg/dL    Creatinine 4.69 6.29 - 1.30 mg/dL    BUN/Creatinine Ratio 32     EGFR CKD-EPI Non-African American, Male >90 >=60 mL/min/1.8m2    EGFR CKD-EPI African American, Male >90 >=60 mL/min/1.12m2    Glucose 344 (H) 70 - 179 mg/dL    Calcium 7.8 (L) 8.5 - 10.2 mg/dL    Albumin 2.5 (L) 3.5 - 5.0 g/dL    Total Protein      Total Bilirubin 32.6 (H) 0.0 - 1.2 mg/dL    AST 528 (H) 19 - 55 U/L    ALT      Alkaline Phosphatase 660 (H) 65 - 260 U/L   Gamma GT (GGT)    Collection Time: 08/26/19  5:31 AM   Result Value Ref Range    GGT 427 (H) 12 - 109 U/L   Magnesium Level    Collection Time: 08/26/19  5:31 AM   Result Value Ref Range    Magnesium 1.7 1.6 - 2.2 mg/dL   Phosphorus Level    Collection Time: 08/26/19  5:31 AM   Result Value Ref Range    Phosphorus 2.7 (L) 2.9 - 4.7 mg/dL   PT-INR    Collection Time: 08/26/19  5:31 AM   Result Value Ref Range    PT 19.1 (H) 10.2 - 13.1 sec    INR 1.64    Bilirubin, Direct    Collection Time: 08/26/19  5:31 AM   Result Value Ref Range    Bilirubin, Direct 29.80 (H) 0.00 - 0.40 mg/dL   CBC w/ Differential    Collection Time: 08/26/19  5:31 AM   Result Value Ref Range    WBC 6.2 4.5 - 11.0 10*9/L    RBC 3.19 (L) 4.50 - 5.90 10*12/L    HGB 8.8 (L) 13.5 - 17.5 g/dL    HCT 41.3 (L) 24.4 - 53.0 %    MCV 84.1 80.0 - 100.0 fL    MCH 27.4 26.0 - 34.0 pg    MCHC 32.6 31.0 - 37.0 g/dL    RDW 01.0 (H) 27.2 - 15.0 %    MPV 7.2 7.0 - 10.0 fL    Platelet 95 (L) 150 - 440 10*9/L    Neutrophils % 95.7 %    Lymphocytes % 1.1 %    Monocytes % 2.6 %    Eosinophils % 0.0 %    Basophils % 0.1 %    Neutrophil Left Shift 1+ (A) Not Present    Absolute Neutrophils 5.9 2.0 - 7.5 10*9/L    Absolute Lymphocytes 0.1 (L) 1.5 - 5.0 10*9/L    Absolute Monocytes 0.2 0.2 - 0.8 10*9/L Absolute Eosinophils 0.0 0.0 - 0.4 10*9/L    Absolute Basophils 0.0 0.0 - 0.1 10*9/L    Large Unstained Cells 0 0 - 4 %    Microcytosis Marked (A) Not Present    Macrocytosis Moderate (A) Not Present  Anisocytosis Marked (A) Not Present    Hypochromasia Slight (A) Not Present   POCT Glucose    Collection Time: 08/26/19  7:55 AM   Result Value Ref Range    Glucose, POC 285 (H) 70 - 179 mg/dL   Vancomycin, Trough    Collection Time: 08/26/19  8:07 AM   Result Value Ref Range    Vancomycin Tr 14.9 10.0 - 20.0 ug/mL     ========================================    Val Eagle, MD  Anesthesiology, PGY-1

## 2019-08-26 NOTE — Unmapped (Signed)
Endocrinology Consult - Follow Up Note    Requesting Attending Physician :  Melba Coon, MD  Service Requesting Consult : [facility])  Primary Care Provider: [name], MD  Assessment/Recommendations:      Glucocorticoids induced hyperglycemia- A1c 4.9% (10/2018)- Discussed his refusal of insulin and glycemic checks today at length. He agreed to our team that he would take insulin and check sugars.  ??  Plan  - Lantus 15U nightly, half dose if NPO  -??Lispro??4units TIDAC with meals??if able  -????PRN Lispro dose for overnight meal: if a normal sized meal give 4 units, if smaller give 2 units lispro  - Continue sensitive sliding scale QACHS     ??  discussed with Dr. Marcello Fennel??  ??  Primary team informed of recommendations. We will continue to follow patient as needed. Please call/page [phone] with questions or concerns.     Elpidio Galea, MD  PGY5 Endocrinology Fellow     I was immediately available via phone/pager or present on site.  I reviewed and discussed the case with the resident, but did not see the patient.  I agree with the assessment and plan as documented in the resident's note.     Maximino Greenland, MD, MPH  Attending - Endocrinology, Diabetes, and Metabolism    Time spent reviewing chart 10 minutes  Time spent with fellow 10 minutes        Subjective/24 hour events:    Took long acting again but did not have mealtime insulin for dinner. Hyperglycemic. Notes he does not really want to take meal insulin today. Eating well      Objective: :  BP 134/73 Comment: best of 2, pt refused recheck - Pulse 69  - Temp 37 ??C (Oral)  - Resp 18  - Ht 174.6 cm (5' 8.74)  - Wt 60.5 kg (133 lb 6.1 oz)  - SpO2 100%  - BMI 19.85 kg/m??         General: chronically ill appearing, NAD, eating lunch  Eyes: EOMI  Lungs: non labored respirations on room air  Neuro: The patient was alert and appropriate for conversation.  Psych: Flat affect.      Test Results    Lab Results   Component Value Date    POCGLU 285 (H) 08/26/2019    POCGLU 241 (H) 08/25/2019    POCGLU 315 (H) 08/22/2019    POCGLU 202 (H) 08/22/2019    POCGLU 214 (H) 08/22/2019     Lab Results   Component Value Date    CREATININE 0.73 08/26/2019    NA 138 08/26/2019    K 4.0 08/26/2019    CL 106 08/26/2019    CO2  08/26/2019      Comment:      Specimen icteric.    ANIONGAP  08/26/2019      Comment:      Unable to Calculate.    CALCIUM 7.8 (L) 08/26/2019    ALBUMIN 2.5 (L) 08/26/2019

## 2019-08-26 NOTE — Unmapped (Signed)
Follow-up Pediatric Palliative Care Consult Note:  ??  [name] Gross is a 18 y.o. male seen for pediatric palliative care consultation at the request of Dr. Curtis Sites for goals of care, advance care planning, and family support.  ??  Primary Care Provider: [name], MD  ??  History provided by: Patient  ??  Assessment:  ??  [name] Gross is a 18 y.o. 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 His liver function has not yet responded to high dose steroids and thymoglobulin, which is concerning for possible need for another liver transplant if he could be eligible, which providers have expressed is unlikely for psychosocial reasons. [name] Gross is at risk for losing his ability to care for himself/make decisions for himself. He has been encouraged to identify a surrogate decision maker (he does not wish for this to default to next of kin) during this time while he is at risk for further decompensation/further decline in health.  Family meeting held today to update surrogate decision makers about Zurich' current state of health and plan moving forward.  See below.  Palliative care consult requested for goals of care, advance care planning, and family support.     ??  Summary of Discussion and Recommendations:   ??  1. SYMPTOMS:   - Nausea: Reported to be improved on current regimen.  Appetite also increased.  - Insomnia: Scheduled melatonin nightly.  Dose increased from 3 mg to 6 mg on 10/12.  Also agrees he would benefit from working more with Psych on managing anxiety which is interfering with sleep.  Earplugs available at bedside.  - Mood: History of depression and suicide attempt in Apirl 2019, denied SI to other providers during this hospital stay. Also endorses feeling anxious about his condition and about his social situation. Adult psychiatry last saw [name] Gross 10/14 and plans to f/u per note.  - ID: Peripheral and central line blood cultures positive for strep mitis, suggestive of systemic source; has PICC line. Liver biopsy and paracentesis canceled.  Immunosuppression therapy put on hold with additional plan to taper steroids.  ??  2. GOALS:   - Amaurie hopes to make a recovery, find housing, and find a job. He shared that he anticipates discharging to a shelter but notes is friends are encouraging him to move in with family - he believes this is because they are concerned about his safety.   - [name] Gross has long term goals which include going back to school and becoming a Runner, broadcasting/film/video or nurse.   ??  3. DECISIONS:   - Given risk of hepatic encephalopathy, and general risk of his condition deteriorating, recommended [name] Gross think about who he would want to be his surrogate decision maker if he was unable to make decisions for himself (see ACP below)   - Garren likes to receive information directly.   - Dr. Melrose Nakayama has reviewed with [name] Gross that redo liver transplant can be an option for some people and inquired whether this is something [name] Gross knows about. [name] Gross acknowledged knowing that he may not be a candidate and that he doesn't know if he would choose to pursue another transplant. Note that he has been told by transplant SW that if he would like to be a candidate for liver transplant, he needs social supports including stable housing, transportation, source of income, et.    - [facility] Decision Making Tool  given and explained at a previous visit. He was encouraged to use this tool along with an ACP guide to document information he receives from providers.   ??  4. ADVANCE CARE PLANNING  - Reminded Raymar of the Voicing My Choices booklet and offered to assist him with completing book if wanted/needed. Tyleek worked on Voicing My Choices more over the weekend and would like to designate a Runner, broadcasting/film/video. See ACP note.   - Recommended Raedyn think about who he would want to be his surrogate decision maker if he was unable to make decisions for himself. He has repeatedly shared that he would not trust his mother to make decisions for him. Nysir has started this conversation with friends, [name] Gross and [name] Gross, who he agrees would need more information about what that role may look like and not updated on Jayse's current clinical status   - Code status: Full code, not discussed today.  - Prognosis: Guarded given severity of liver disease and potential for lack of response to salvage therapies, AKI, and complex social situation with lack of caregiver support.  ??  5. SUPPORT:   - Hogan describes limited personal supports including an aunt and maternal grandparents, and friends. He has not spoken to any family since being admitted to the hospital. He has reached out to two friends who have been good supports throughout his illness  - Discussed hospital based resources including chaplains.  - Golden agreed to begin SSI application as suggested by transplant social worker, as the process could take months to complete.  He has since decided to wait until he was on a secure network instead of the an open/public network at the hospital due to security concerns.  He was also told how to access his medical records.  ??  6. CARE COORDINATION:   - Care coordinated with Dr. Darnelle Bos (GI Fellow) and nursing.     ??- Family meeting held today (10/15) with [name] Gross, [name] Gross (friend), [name] Gross (grandfather), and [name] Gross (aunt), Dr. Darnelle Bos (GI Fellow), and supportive care team.  Surrogate decision makers/family were via phone.  Dr. Darnelle Bos reported that [name] Gross had a previous liver transplant and based on liver biopsy, he was in severe rejection.  The range of severity is rated 1 to 9, and [name] Gross was a 9.  [name] Gross has been receiving immunosuppressant therapy, but daily labs indicate that he is not improving as much as his care team would like.  This week, [name] Gross developed an infection in his blood stream (not unusual given medications he is on) which required that his immunosuppressant therapy be paused so that the infection could be treated; started on antibiotics.  His most recent blood cultures have been negative but the strong immunosuppressant medications cannot be restarted until the infection has cleared.    Dr. Darnelle Bos shared that the team was very concerned that [name] Gross liver may not get better.  If his liver does not recover, then he would need a liver transplant; currently working with transplant team.  Criteria for receiving a transplant included stable housing, consistent follow-up, and support persons/caregivers.  Unfortunately, Tahmid' liver could fail before getting on the transplant list, which would mean him becoming very sick.  His symptoms would be treated with medications and procedures, therefore prolonging his life, but ultimately his liver would fail completely and [name] Gross would die.    For clarity, it was verbalized and acknowledged that [name] Gross was Kenn' primary Runner, broadcasting/film/video.  [name] Gross stated that he understood the medical  information that was presented, accepted the role, and agreed to carry out David' wishes.  [name] Gross Salina verbalized and acknowledged that he was the secondary decision maker (after [name] Gross).  He also stated that he understood the medical information that was presented, accepted the role, and agreed to carry out [name]' wishes.  They were all told that [name] Gross was working through an advance care planning tool that outlined his wishes should he not be able to make decisions for himself.  It could then be shared with them when he was ready.    Caroll' Aunt Enrique Sack expressed to Caedon that he was loved.  She stated they wanted him to live, and they wanted to support him.  She acknowledged the strained relationship between Merdith and his mother but hoped for forgiveness and reconciliation on both their parts.  Gibson agreed to place [name] Gross Salina and Enrique Sack on his visitation list, as they requested to see him.    We thanked family for their support, hoping that Junie would not have to make difficult decisions about his healthcare alone.  We also acknowledged his autonomy as an adult and reminded family that communication would continue to flow through Corney unless permission was given to speak with them/others.  They were informed that family meetings could be planned in the future to provide medical updates as needed.  Family expressed understanding and was appreciative of the information presented.    Audley was tearful during the meeting, especially when his aunt expressed their love for him.  He felt the meeting went well, and he expressed how grateful he was for [name] Gross, describing his as a reliable and dependable friend.         Total time spent with patient for evaluation & management (excluding ACP documented separately): 60 minutes (2-3:00pm).  Greater than 50% of this time spent on counseling/coordination of care: Yes.  See ACP Note from today for additional billable service: Yes.     We appreciate the consult. The Children's Supportive Care Team can be reached by pager [name]) or email ([email or web address]).   ??  ??  History of Present Illness:  Toris??is a 19 y.o.??male??with history primary sclerosing cholangitis s/p liver transplant (2017) with multiple??epsiodes??of rejection, depression (history of suicide attempt via over dose in April 2019)??who??was admitted with reports of??black stools, abdominal pain, and icteric sclera??most likely due to acute liver transplant rejection/failure. His hospital course has been complicated by AKI, significant ascites. His liver function has not yet responded to pulse dose steroids, primary team concerned about overall prognosis and need for repeat liver transplant.   ??  Interval events:  Family meeting held today with surrogate decision makers.  Deaire was an active participant.  See above and also today's ACP note    Allergies:  Bee pollen and Pollen extracts  ??  Medications:  Scheduled Meds:  ??? benzoyl peroxide   Topical Nightly   ??? calcium carbonate  300 mg elem calcium Oral BID   ??? cefTRIAXone  2 g Intravenous Q24H   ??? clindamycin  1 application Topical daily   ??? ergocalciferol  50,000 Units Oral Weekly   ??? flu vacc [id number] 6mos up(PF)  0.5 mL Intramuscular During hospitalization   ??? insulin glargine  15 Units Subcutaneous Nightly   ??? insulin lispro  0-5 Units Subcutaneous ACHS   ??? insulin lispro  4 Units Subcutaneous TID AC   ??? ketoconazole   Topical daily   ??? melatonin  6 mg Oral QPM   ??? methylPREDNISolone sodium succinate  100 mg Intravenous Once   ??? methylPREDNISolone sodium succinate  80 mg Intravenous Daily   ??? metronidazole  500 mg Intravenous Q8H   ??? multivitamin, with zinc  2 tablet Oral Daily   ??? mupirocin   Topical TID   ??? nystatin  1,000,000 Units Oral TID   ??? ondansetron  4 mg Intravenous Once   ??? pantoprazole  40 mg Oral Daily   ??? phytonadione (AQUA-MEPHYTON) intravenous  7.5 mg Intravenous Daily   ??? potassium phosphate (monobasic)  500 mg Oral daily   ??? rifAXIMin  550 mg Oral BID   ??? sulfamethoxazole-trimethoprim  1 tablet Oral Q MWF   ??? valGANciclovir  450 mg Oral Daily     Continuous Infusions:  ??? sodium chloride 500 mL (08/26/19 0537)     PRN Meds:.dextrose 50 % in water (D50W), heparin, porcine (PF), insulin lispro, ondansetron, polyethylen glycol    Physical Exam:   Vitals:    08/26/19 1115   BP: 117/78   Pulse: 60   Resp: 18   Temp: 36.8 ??C (98.2 ??F)   SpO2: 100%   ??  Gen: thin young man sitting up in bed, NAD.  Skin: jaundiced.  HEENT: +scleral icterus.   Chest: normal work of breathing  Abd: protruberant  Psych: reserved, flat affect, converses fluently.   ??  Data: Reviewed test results in Epic.    La Lindwood Qua, [place], CPNP  Children's Supportive Care Team  [phone] (pager)        Collaborating Physician Attestation    I was the supervising physician in the delivery of the service. I discussed Keigo's case with [facility], CPNP and was involved with making the recommendations contained in this note.     Waldon Reining, MD, MPH  Attending Physician, Children's Supportive Care Team

## 2019-08-26 NOTE — Unmapped (Signed)
Pediatric Vancomycin Therapeutic Monitoring Pharmacy Note    [name] Gross is a 18 y.o. male continuing vancomycin. Date of therapy initiation: 08/25/2019    Indication: Bacteremia/Sepsis and Central line associated bloodstream infection  growing Staph and Strep    Prior Dosing Information: Current regimen 906 mg (15 mg/kg) IV Q8      Dosing Weight: 60.4 kg    Goals:  Therapeutic Drug Levels  Vancomycin trough goal: 10-15 mg/L    Additional Clinical Monitoring/Outcomes  Renal function, volume status (intake and output)    Results: Vancomycin level 14.9 mg/L, drawn appropriately    Wt Readings from Last 1 Encounters:   08/26/19 62.3 kg (137 lb 5.6 oz) (23 %, Z= -0.75)*     * Growth percentiles are based on CDC (Boys, 2-20 Years) data.     Creatinine   Date Value Ref Range Status   08/26/2019 0.73 0.70 - 1.30 mg/dL Final   16/08/9603 5.40 0.70 - 1.30 mg/dL Final   98/09/9146 8.29 0.70 - 1.30 mg/dL Final        UOP: 1.9 mL/kg/hr    Pharmacokinetic Considerations and Significant Drug Interactions:  ? Dosed per pediatric guideline  ? Concurrent nephrotoxic meds: not applicable    Assessment/Plan:  Recommendation(s)  ? Continue current regimen of vancomycin 906 mg (15 mg/kg) IV every 8 hours    Follow-up  ? Next level will be ordered: in 3-5 days  ? A pharmacist will continue to monitor and order levels as appropriate    Please page service pharmacist with questions/clarifications.    Darrick Grinder, PharmD, BCPS, BCPPS

## 2019-08-26 NOTE — Unmapped (Addendum)
Ananias' VSS, afebrile. PICC c/d/I with fluids infusing. Platelets given overnight, tolerated well. Pt refused blood sugar checks. Lantus given. No complaints of pain or nausea. Pt eating and drinking some. Remains edematous. Voiding well. Pt was in good spirits most of the night, but tearful at times. Will continue to monitor.         Problem: Adult Inpatient Plan of Care  Goal: Plan of Care Review  Outcome: Ongoing - Unchanged  Goal: Patient-Specific Goal (Individualization)  Outcome: Ongoing - Unchanged  Goal: Absence of Hospital-Acquired Illness or Injury  Outcome: Ongoing - Unchanged  Goal: Optimal Comfort and Wellbeing  Outcome: Ongoing - Unchanged  Goal: Readiness for Transition of Care  Outcome: Ongoing - Unchanged  Goal: Rounds/Family Conference  Outcome: Ongoing - Unchanged     Problem: Wound  Goal: Optimal Wound Healing  Outcome: Ongoing - Unchanged     Problem: Fall Injury Risk  Goal: Absence of Fall and Fall-Related Injury  Outcome: Ongoing - Unchanged     Problem: Self-Care Deficit  Goal: Improved Ability to Complete Activities of Daily Living  Outcome: Ongoing - Unchanged

## 2019-08-26 NOTE — Unmapped (Signed)
Afebrile, VSS on room air. IV vitamin K given. Magnesium replaced. Bcx drawn, from PICC and peripheral @1538 . Vanc restarted. UOP is adequate, BM x1. Pt is eating and drinking some. Refused afternoon acucheck and insulin. No caregiver at bedside. Will continue to monitor and follow plan of care.    Problem: Adult Inpatient Plan of Care  Goal: Plan of Care Review  Outcome: Progressing  Goal: Patient-Specific Goal (Individualization)  Outcome: Progressing  Goal: Absence of Hospital-Acquired Illness or Injury  Outcome: Progressing  Goal: Optimal Comfort and Wellbeing  Outcome: Progressing  Goal: Readiness for Transition of Care  Outcome: Progressing  Goal: Rounds/Family Conference  Outcome: Progressing     Problem: Wound  Problem: Self-Care Deficit  Goal: Improved Ability to Complete Activities of Daily Living  Outcome: Progressing     Goal: Optimal Wound Healing  Outcome: Progressing     Problem: Fall Injury Risk  Goal: Absence of Fall and Fall-Related Injury  Outcome: Progressing

## 2019-08-26 NOTE — Unmapped (Signed)
Afebrile, VSS on room air. Insulin sliding scale given x1 for BG of 244. Vitamin K given. UOP adequate, BM x1. Pt eating and drinking. PICC is C/D/I, heplocked @1400 . Grandma is at bedside. Will continue to monitor and follow plan of care.     Problem: Adult Inpatient Plan of Care  Goal: Plan of Care Review  Outcome: Progressing  Goal: Patient-Specific Goal (Individualization)  Outcome: Progressing  Goal: Absence of Hospital-Acquired Illness or Injury  Outcome: Progressing  Goal: Optimal Comfort and Wellbeing  Outcome: Progressing  Goal: Readiness for Transition of Care  Outcome: Progressing  Goal: Rounds/Family Conference  Outcome: Progressing     Problem: Wound  Goal: Optimal Wound Healing  Outcome: Progressing     Problem: Fall Injury Risk  Goal: Absence of Fall and Fall-Related Injury  Outcome: Progressing     Problem: Self-Care Deficit  Goal: Improved Ability to Complete Activities of Daily Living  Outcome: Progressing

## 2019-08-27 LAB — COMPREHENSIVE METABOLIC PANEL
ALBUMIN: 2.4 g/dL — ABNORMAL LOW (ref 3.5–5.0)
ALKALINE PHOSPHATASE: 687 U/L — ABNORMAL HIGH (ref 65–260)
BILIRUBIN TOTAL: 31.5 mg/dL — ABNORMAL HIGH (ref 0.0–1.2)
BLOOD UREA NITROGEN: 18 mg/dL (ref 7–21)
BUN / CREAT RATIO: 21
CALCIUM: 7.6 mg/dL — ABNORMAL LOW (ref 8.5–10.2)
CHLORIDE: 104 mmol/L (ref 98–107)
CREATININE: 0.84 mg/dL (ref 0.70–1.30)
EGFR CKD-EPI AA MALE: >90 mL/min/{1.73_m2} (ref >=60)
EGFR CKD-EPI NON-AA MALE: >90 mL/min/{1.73_m2} (ref >=60)
GLUCOSE RANDOM: 302 mg/dL — ABNORMAL HIGH (ref 70–179)
POTASSIUM: 3.7 mmol/L (ref 3.5–5.0)
SODIUM: 132 mmol/L — ABNORMAL LOW (ref 135–145)

## 2019-08-27 LAB — CBC W/ AUTO DIFF
BASOPHILS ABSOLUTE COUNT: 0 10*9/L (ref 0.0–0.1)
BASOPHILS RELATIVE PERCENT: 0.1 %
EOSINOPHILS RELATIVE PERCENT: 0.1 %
HEMOGLOBIN: 9.4 g/dL — ABNORMAL LOW (ref 13.5–17.5)
LYMPHOCYTES ABSOLUTE COUNT: 0.1 10*9/L — ABNORMAL LOW (ref 1.5–5.0)
LYMPHOCYTES RELATIVE PERCENT: 0.9 %
MEAN CORPUSCULAR HEMOGLOBIN CONC: 32.7 g/dL (ref 31.0–37.0)
MEAN CORPUSCULAR HEMOGLOBIN: 27.4 pg (ref 26.0–34.0)
MEAN CORPUSCULAR VOLUME: 84 fL (ref 80.0–100.0)
MEAN PLATELET VOLUME: 7.3 fL (ref 7.0–10.0)
MONOCYTES ABSOLUTE COUNT: 0.1 10*9/L — ABNORMAL LOW (ref 0.2–0.8)
MONOCYTES RELATIVE PERCENT: 1.9 %
NEUTROPHILS ABSOLUTE COUNT: 5.7 10*9/L (ref 2.0–7.5)
NEUTROPHILS RELATIVE PERCENT: 96.8 %
PLATELET COUNT: 78 10*9/L — ABNORMAL LOW (ref 150–440)
RED BLOOD CELL COUNT: 3.43 10*12/L — ABNORMAL LOW (ref 4.50–5.90)
RED CELL DISTRIBUTION WIDTH: 27.5 % — ABNORMAL HIGH (ref 12.0–15.0)
WBC ADJUSTED: 5.9 10*9/L (ref 4.5–11.0)

## 2019-08-27 LAB — PHOSPHORUS: Phosphate:MCnc:Pt:Ser/Plas:Qn:: 2.7 — ABNORMAL LOW

## 2019-08-27 LAB — BILIRUBIN DIRECT: Bilirubin.glucuronidated+Bilirubin.albumin bound:MCnc:Pt:Ser/Plas:Qn:: 28.6 — ABNORMAL HIGH

## 2019-08-27 LAB — ANISOCYTOSIS

## 2019-08-27 LAB — GAMMA GT: GAMMA GLUTAMYL TRANSFERASE: 466 U/L — ABNORMAL HIGH (ref 12–109)

## 2019-08-27 LAB — CREATININE: Creatinine:MCnc:Pt:Ser/Plas:Qn:: 0.84

## 2019-08-27 LAB — MAGNESIUM: Magnesium:MCnc:Pt:Ser/Plas:Qn:: 1.3 — ABNORMAL LOW

## 2019-08-27 LAB — PROTIME-INR: PROTIME: 19.5 s — ABNORMAL HIGH (ref 10.2–13.1)

## 2019-08-27 LAB — PROTIME: Coagulation tissue factor induced:Time:Pt:PPP:Qn:Coag: 19.5 — ABNORMAL HIGH

## 2019-08-27 LAB — GAMMA GLUTAMYL TRANSFERASE: Gamma glutamyl transferase:CCnc:Pt:Ser/Plas:Qn:: 466 — ABNORMAL HIGH

## 2019-08-27 NOTE — Unmapped (Signed)
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 He agreed to our team that he would take insulin and check sugars.  ??  Plan  - Lantus 15U nightly, half dose if NPO  -??Lispro??6 units TIDAC with meals??if able  -????PRN Lispro dose for overnight meal: if a normal sized meal give 4 units, if smaller give 2 units lispro  - Continue sensitive sliding scale QACHS     ??  discussed with Dr. Marcello Fennel??  ??  Primary team informed of recommendations. We will continue to follow patient as needed. Please call/page [phone] with questions or concerns.     Elpidio Galea, MD  PGY5 Endocrinology Fellow     I was immediately available via phone/pager or present on site.  I reviewed and discussed the case with the resident, but did not see the patient.  I agree with the assessment and plan as documented in the resident's note.     Maximino Greenland, MD, MPH  Attending - Endocrinology, Diabetes, and Metabolism    Time spent reviewing chart 10 minutes  Time spent with fellow 10 minutes        Subjective/24 hour events:    Had a normal sugar in the afternoon but high before dinner. Getting lispro more consistently. Steroids at 80mg  daily now.    Objective: :  BP 110/69  - Pulse 77  - Temp 36.8 ??C (Oral)  - Resp 18  - Ht 174.6 cm (5' 8.74)  - Wt 62.3 kg (137 lb 5.6 oz)  - SpO2 100%  - BMI 20.44 kg/m??         General: chronically ill appearing, NAD, eating lunch  Eyes: EOMI  Lungs: non labored respirations on room air  Neuro: The patient was alert and appropriate for conversation.  Psych: Flat affect.      Test Results    Lab Results   Component Value Date    POCGLU 251 (H) 08/26/2019    POCGLU 144 08/26/2019    POCGLU 285 (H) 08/26/2019    POCGLU 241 (H) 08/25/2019    POCGLU 315 (H) 08/22/2019     Lab Results   Component Value Date    CREATININE 0.73 08/26/2019    NA 138 08/26/2019    K 4.0 08/26/2019    CL 106 08/26/2019    CO2  08/26/2019      Comment:      Specimen icteric.    ANIONGAP  08/26/2019      Comment:      Unable to Calculate.    CALCIUM 7.8 (L) 08/26/2019    ALBUMIN 2.5 (L) 08/26/2019

## 2019-08-27 NOTE — Unmapped (Signed)
Pediatric Daily Progress Note     Assessment/Plan:     Principal Problem:    Liver transplant rejection (CMS-HCC)  Active Problems:    Recurrent major depressive disorder, in partial remission (CMS-HCC)    Malnutrition of mild degree (CMS-HCC)    History of liver transplant (CMS-HCC)    Transaminitis    Normocytic anemia    Hypoalbuminemia    Steroid-induced hyperglycemia    Pityrosporum folliculitis    Abdominal pain    Homeless    Thrombocytopenia (CMS-HCC)    Hypomagnesemia    Hypophosphatasia    Other ascites    MSSA bacteremia    Hyperbilirubinemia    Impetigo lesions of Right Ear and Posterior Neck    Steroid-induced acne    Liver dysfunction secondary to transplant rejection     Irritant contact dermatitis of the hands    Sleep difficulties    Strep Mitis Central line-associated bloodstream infection  Resolved Problems:    Cholangitis of transplanted liver (CMS-HCC)    Melena    Dark stools    Samari??is a 18 y.o.??male??with history of??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 Along with steroids, tacrolimus, and cellcept, thyroglobulin treatment was initiated (on 10/01-10/12) but yielded limited benefit in liver studies to date. Patient has discussed prognosis with Dr. Melrose Nakayama and palliative care including the possibility of not recovering from liver rejection (10/9). Patient understands that transplant is an option but that he may not be a candidate. His main active issue currently is treating CLABSI secondary to strep mitis and MSSA (found after cultures obtained secondary to leukocytosis on daily labs) with Unasyn for a total of a 14 day course (through 10/27) and salvaging his PICC line for now given difficulty with access. We are currently holding all immunosuppression (thymoblobulin, Cellcept, tacrolimus), weaning steroids, and holding off on a repeat liver biopsy pending adequate treatment of his bloodstream infection. We will reconsider starting immunosuppression after one week of antibiotics (~10/23). His other active issues are complications secondary to liver dysfunction (hypoalbuminemia, ascites, edema, abdominal pain, elevated INR, electrolyte abnormalities). Currently his ascites/edema is stable; we will continue to monitor closely and consider albumin/lasix as needed. He also has steroid-induced hyperglycemia, currently being managed on insulin with daily adjustments. Remaining plan as follows:     #Hx Liver Transplant - Subacute/Acute Liver Failure: ??Pt has biopsy confirmed severe acute rejection (9/25), RAI 9/9. Liver labs without a significant improvement after immunosuppression.   - tranplant team recommending repeat liver biopsy, but delaying given CLABSI infection (plan to reschedule once adequately treated)  --S/p 12 days of ATG and high dose MethylPrednisolone (10/1-10/12) with poor overall repsonse to therapy   - Will continue to hold immunosuppressive therapy through 10/23 (s/p 10 days of antibiotics)   - Holding Tacro and Mycophenolate    - Steroids: Wean Solumedrol to PO 60mg  daily 10/17     - anticipate weaning again 10/19 per Dr. Star Age  - Continue Valgancyclovir 450mg  daily (3months) and Bactrim MWF (34mo) for prophylaxis (10/2-)  - Continue on Nystatin 10ml TID (59mo) prophylaxis (10/2-)  - PT/INR daily (elvated PT/INR secondary to liver dysfunction)   - vitamin K IV daily without change    - will discuss IV to PO with Dr. Melrose Nakayama   - Protonix 40 mg PO daily  - Continue Rifaximin (given elevated ammonia, no encephalopathy)     #CLABSI: Strep  mitis and MSSA   Developed sudden leukocytosis with white count elevated to 17.3 10/12, afebrile, non-toxic appearing, without localizing symptoms. Found to have positive Bcx 10/12 growing strep mitis and MSSA. Given ascites and risk for SBP secondary to strep mitis, also covering for anaerobes. Echo negative for endocarditis   - 10/12 peripheral/PICC Cx positive for Strep mitis  - 1/012 peripheral - +MSAA  - f/u Bcx:   - 10/12 - NGTD   - 10/13 - NGTD   - 10/14 - NGTD  - Per ID recs:   -Unasyn x 14 days total (10/13-10/27)   - Given unreliable PIV access, we will treat through his central line in attempt to salvage this PICC    - If afebrile and no new +BCx, negative Cx, may resume immunosupression regimen s/p 10d of abx (10/13-10/23)   - Repeat bcx if repeat culture positive     #Edema, ascites, abdominal pain secondary to liver dysfunction  Ascites still present, stable.   - Holding IVFs, taking good PO  - Following daily weights (weight down 2lb today)  - STRICT I/Os      #hypoalbuminemia  - likely contributing to ascites/edema  - responded to albumin/lasix spot doing, will consider for worsening edema    #Nutriton: severe protein-calorie malnutrition. Prior to admission, patient has been skipping meals due to poor appetite and food insecurity.  - Continue goal PO fluid intake of 1.6L   - Regular diet +  Ensure supplements   - Ergocalciferol qMon   - aquadex multivitamin    - Ca 300 mg BID    #Electrolyte abnormalities: hypomagnesemia, hypophosphatemia, hyponatremia (corrected to 137 with hyperglycemia),   - Daily CMP, Mg, Phos  - hypomagnesemia   - mag sulphate IV prn (10/15, 10/17)  - hypophosphatemia    - k-phos daily (10/16-)    #constipation  - Miralax 17 g BID PRN    #Hyperglycemia   -insulin/accucheck compliance has been an issue this week, with better compliance over the past 24 hours  -New Insulin regiment per endocrine recs (10/17)   - Lantus 15U nightly (1/2 dose if NPO) (increased 10/16)   -??Lispro??6units TIDAC with meals??(increased 10/17)   -????PRN Lispro dose for overnight meal: if a normal sized meal give 4 units, if smaller give 2 units lispro   - Continue sensitive sliding scale QACHS    #Thrombocytopenia  - s/p plt transfusion on 10/13, 10/15 for PLT of 50k     #Impetigo Skin Lesions on Right Ear and Left posterior Neck, + MSSA, healing well   - Derm consulted. Appreciate recs   - Mupirocin ointment for both lesions until healed, per derm    #steroid-induced acne, possible fungal folliculitis   - topical clindamycin, benzoyl peroxide, Ketoconazole shampoo    ??  #Major Depressive Disorder, insomnia: Stable. Has hx of attempted overdoses, with the last attempt in April 2019??at which point he was hospitalized in the Adolescent Psychiatry unit and discharged on Lexapro 20 mg. ??He has not engaged with mental health resources or taken medication for depression or insomnia in 8-10 months.????Denies SI, passive suicidality, self-harm, or HI at this time.    - Patient saw adult psychiatry team again on 10/13. Offered SSRI, pt still considering   - will only see adult psychiatry   - melatonin 6 mg 1 hour before bedtime    #Deconditioning. Patient endorses feeling weak and having limited mobility.   -PT following  -Encourage OOB and ambulation    #Homelessness -  Social Concerns:??Joneric has experienced homelessness for ~5 months when he left his mother's house. ??He has severed all ties with his family and states his relationship with his mother cannot be reconciled. ??He also requested contact information of family members to be erased from his chart (this has been completed, but social worker did drop contact info into her note on 9/24) and he more recently has reconciled with some family members. ??In addition to food insecurity, he has not been able to secure medications. ??He is routinely exposed to alcohol and drugs (marijuana, cocaine, etc.) second-hand through his friendships though he denies current use himself. ??On urine drug screen, he tested positive for marijuana only.  -Supportive care following.  -Designated a personal friend [name]) and grandfather to be his HCPOAs (Friend is the Primary).   -Spoke with sister on 10/16 (has not been in regular contact)   -Paternal grandmother visitied 10/16 & 10/17  ??  Access: PICC (placed 9/28), will need removal if he becomes unstable in the setting of CLABSI infection   ??  Discharge criteria:??liver transplant work-up and treatment of acute rejection    Subjective:     Interval History:   Patient lying flat in bed. No complaints. Conversational, but with flat affect. Appeared more conversational and open to discussing his care and social issues than previous interviews.     Objective:     Vital signs in last 24 hours:  Temp:  [36.6 ??C-36.9 ??C] 36.9 ??C  Heart Rate:  [63-82] 82  SpO2 Pulse:  [65-67] 67  Resp:  [18] 18  BP: (109-114)/(57-77) 109/76  MAP (mmHg):  [74-87] 83  SpO2:  [100 %] 100 %  Intake/Output last 3 shifts:  I/O last 3 completed shifts:  In: 3381.5 [P.O.:1440; I.V.:1617.5; Blood:324]  Out: 4830 [Urine:4830]    Physical Exam:   General:   Sitting up in bed, awake. Thin appearing. Strength appears improved today.   Head: normocephalic, atraumatic.  Eyes: EOMI. Marked scleral icterus present.   Nose:   clear, no discharge  Lungs: clear breath sounds bilaterally. No increased work of breathing. No wheezes/crackles. Good aeration at the bases.   Heart: regular rate and rhythm. No murmurs appreciated on auscultation.  Abdomen:  Abdomen, soft, mild TTP over the right side. Ascites present. No HSM.   Neuro: deconditioned, moves all extremities with decreased strength.   Extremities: non-pitting edema in the LE, WWP  Skin: Skin lesions on left neck, healing well.  Skin lesion present on right ear with scabbed. Acneform rash present on face, back, chest.     Active Medications reviewed and KEY Medications include:     Current Facility-Administered Medications:   ???  ampicillin-sulbactam dilution (UNASYN) injection 3 g, 3 g, Intravenous, Q6H, David Stall, MD, 3 g at 08/27/19 0844  ???  benzoyl peroxide 5 % gel, , Topical, Nightly, Alfredia Ferguson, MD  ???  calcium carbonate (OS-CAL) tablet 300 mg elem calcium, 300 mg elem calcium, Oral, BID, David Stall, MD, 300 mg elem calcium at 08/27/19 0928  ???  clindamycin (CLEOCIN T) 1 % lotion 1 application, 1 application, Topical, daily, Alfredia Ferguson, MD, 1 application at 08/26/19 1640  ???  dextrose 50 % in water (D50W) 50 % solution 12.5 g, 12.5 g, Intravenous, Q10 Min PRN, David Stall, MD  ???  ergocalciferol (DRISDOL) capsule 50,000 Units, 50,000 Units, Oral, Weekly, David Stall, MD, 50,000 Units at 08/22/19 0848  ???  heparin preservative-free injection 10 units/mL syringe (HEPARIN  LOCK FLUSH), 20 Units, Intravenous, Daily PRN, David Stall, MD, 20 Units at 08/26/19 1353  ???  influenza vaccine quad (FLUARIX, FLULAVAL, FLUZONE) (6 MOS & UP) 2020-21, 0.5 mL, Intramuscular, During hospitalization, David Stall, MD  ???  insulin glargine (LANTUS) injection 15 Units, 15 Units, Subcutaneous, Nightly, David Stall, MD, 15 Units at 08/26/19 2119  ???  insulin lispro (HumaLOG) injection 0-5 Units, 0-5 Units, Subcutaneous, ACHS, David Stall, MD, 3 Units at 08/27/19 1058  ???  insulin lispro (HumaLOG) injection 2-4 Units, 2-4 Units, Subcutaneous, Nightly PRN, David Stall, MD  ???  insulin lispro (HumaLOG) injection 6 Units, 6 Units, Subcutaneous, TID AC, David Stall, MD  ???  ketoconazole (NIZORAL) 2 % shampoo, , Topical, daily, Alfredia Ferguson, MD  ???  magnesium sulfate dilution 100 mg/mL (CENTRAL LINE) injection 2,000 mg, 50 mg/kg, Intravenous, Once, David Stall, MD, 2,000 mg at 08/27/19 1215  ???  melatonin tablet 6 mg, 6 mg, Oral, QPM, David Stall, MD, 6 mg at 08/26/19 2114  ???  multivitamin, with zinc (AQUADEKS) chewable tablet, 2 tablet, Oral, Daily, David Stall, MD, 2 tablet at 08/27/19 0844  ???  mupirocin (BACTROBAN) 2 % ointment, , Topical, TID, David Stall, MD, 1 application at 08/27/19 [phone]  ???  nystatin (MYCOSTATIN) oral suspension, 1,000,000 Units, Oral, TID, David Stall, MD, 1,000,000 Units at 08/27/19 0844  ???  ondansetron (ZOFRAN-ODT) disintegrating tablet 4 mg, 4 mg, Oral, Q8H PRN, David Stall, MD  ???  pantoprazole (PROTONIX) EC tablet 40 mg, 40 mg, Oral, Daily, David Stall, MD, 40 mg at 08/26/19 2117  ???  phytonadione (vitamin K1) (AQUA-MEPHYTON) 7.5 mg in sodium chloride (NS) 0.9 % 50 mL IVPB, 7.5 mg, Intravenous, Daily, Lewie Loron, MD, Last Rate: 173.3 mL/hr at 08/27/19 1025, 7.5 mg at 08/27/19 1025  ???  polyethylene glycol (MIRALAX) packet 17 g, 17 g, Oral, BID PRN, David Stall, MD  ???  potassium phosphate (monobasic) (K-PHOS) tablet 500 mg, 500 mg, Oral, daily, David Stall, MD, 500 mg at 08/27/19 1914  ???  predniSONE (DELTASONE) tablet 60 mg, 60 mg, Oral, Daily, David Stall, MD  ???  rifAXIMin (XIFAXAN) tablet 550 mg, 550 mg, Oral, BID, David Stall, MD, 550 mg at 08/27/19 0845  ???  sodium chloride (NS) 0.9 % infusion, , Intravenous, Continuous, David Stall, MD, Last Rate: 10 mL/hr at 08/26/19 0537, 1,000 mL at 08/27/19 0731  ???  sulfamethoxazole-trimethoprim (BACTRIM) 400-80 mg tablet 80 mg of trimethoprim, 1 tablet, Oral, Q MWF, David Stall, MD, 80 mg of trimethoprim at 08/26/19 0915  ???  valGANciclovir (VALCYTE) tablet 450 mg, 450 mg, Oral, Daily, David Stall, MD, 450 mg at 08/26/19 1757        Studies: Personally reviewed and interpreted.  Labs/Studies:  Labs and Studies from the last 24hrs per EMR and Reviewed and   All lab results last 24 hours:    Recent Results (from the past 24 hour(s))   POCT Glucose    Collection Time: 08/26/19  4:38 PM   Result Value Ref Range    Glucose, POC 144 70 - 179 mg/dL   POCT Glucose    Collection Time: 08/26/19  9:19 PM   Result Value Ref Range    Glucose, POC 251 (H) 70 - 179 mg/dL   Comprehensive Metabolic Panel    Collection Time: 08/27/19  5:35 AM   Result Value Ref Range Sodium  132 (L) 135 - 145 mmol/L    Potassium 3.7 3.5 - 5.0 mmol/L    Chloride 104 98 - 107 mmol/L    Anion Gap      CO2      BUN 18 7 - 21 mg/dL    Creatinine 2.95 6.21 - 1.30 mg/dL    BUN/Creatinine Ratio 21     EGFR CKD-EPI Non-African American, Male >90 >=60 mL/min/1.73m2    EGFR CKD-EPI African American, Male >90 >=60 mL/min/1.96m2    Glucose 302 (H) 70 - 179 mg/dL    Calcium 7.6 (L) 8.5 - 10.2 mg/dL    Albumin 2.4 (L) 3.5 - 5.0 g/dL    Total Protein      Total Bilirubin 31.5 (H) 0.0 - 1.2 mg/dL    AST 308 (H) 19 - 55 U/L    ALT      Alkaline Phosphatase 687 (H) 65 - 260 U/L   Gamma GT (GGT)    Collection Time: 08/27/19  5:35 AM   Result Value Ref Range    GGT 466 (H) 12 - 109 U/L   Magnesium Level    Collection Time: 08/27/19  5:35 AM   Result Value Ref Range    Magnesium 1.3 (L) 1.6 - 2.2 mg/dL   Phosphorus Level    Collection Time: 08/27/19  5:35 AM   Result Value Ref Range    Phosphorus 2.7 (L) 2.9 - 4.7 mg/dL   PT-INR    Collection Time: 08/27/19  5:35 AM   Result Value Ref Range    PT 19.5 (H) 10.2 - 13.1 sec    INR 1.68    Bilirubin, Direct    Collection Time: 08/27/19  5:35 AM   Result Value Ref Range    Bilirubin, Direct 28.60 (H) 0.00 - 0.40 mg/dL   CBC w/ Differential    Collection Time: 08/27/19  5:35 AM   Result Value Ref Range    WBC 5.9 4.5 - 11.0 10*9/L    RBC 3.43 (L) 4.50 - 5.90 10*12/L    HGB 9.4 (L) 13.5 - 17.5 g/dL    HCT 65.7 (L) 84.6 - 53.0 %    MCV 84.0 80.0 - 100.0 fL    MCH 27.4 26.0 - 34.0 pg    MCHC 32.7 31.0 - 37.0 g/dL    RDW 96.2 (H) 95.2 - 15.0 %    MPV 7.3 7.0 - 10.0 fL    Platelet 78 (L) 150 - 440 10*9/L    Neutrophils % 96.8 %    Lymphocytes % 0.9 %    Monocytes % 1.9 %    Eosinophils % 0.1 %    Basophils % 0.1 %    Neutrophil Left Shift 1+ (A) Not Present    Absolute Neutrophils 5.7 2.0 - 7.5 10*9/L    Absolute Lymphocytes 0.1 (L) 1.5 - 5.0 10*9/L    Absolute Monocytes 0.1 (L) 0.2 - 0.8 10*9/L    Absolute Eosinophils 0.0 0.0 - 0.4 10*9/L    Absolute Basophils 0.0 0.0 - 0.1 10*9/L    Large Unstained Cells 0 0 - 4 %    Microcytosis Marked (A) Not Present    Macrocytosis Moderate (A) Not Present    Anisocytosis Marked (A) Not Present    Hypochromasia Slight (A) Not Present   Prepare Platelet Pheresis    Collection Time: 08/27/19  7:00 AM   Result Value Ref Range    Unit Blood Type B Pos     ISBT Number  7300     Unit # [id number]     Status Transfused     Product ID Platelets     PRODUCT CODE [id number]    POCT Glucose    Collection Time: 08/27/19 10:38 AM   Result Value Ref Range    Glucose, POC 266 (H) 70 - 179 mg/dL     ========================================    Gildardo Griffes, MD  Pediatrics, PGY-3  Pager: [phone]      I supervised the resident physician on subsequent day care who spent at least 35 minutes on the floor or unit in direct patient care. The direct patient care time included face-to-face time with the patient, reviewing the patient's chart, communicating with the family and/or other professionals and coordinating care. Greater than 50% of this time was spent in counseling or coordinating care with the patient regarding regarding diagnoses, planned testing, relevant test results, and treatment recommendations. I was available throughout care provided.   Alita Chyle, MD

## 2019-08-27 NOTE — Unmapped (Signed)
[name], afebrile. PICC c/d/I with fluids running, labs drawn per order. Pt compliant with blood sugar checks and insulin. Good PO intake. Voiding well, BM x 1. No complaints of pain or nausea. Grandma at bedside, pt in good spirits. Will continue to monitor.         Problem: Adult Inpatient Plan of Care  Goal: Plan of Care Review  Outcome: Ongoing - Unchanged  Goal: Patient-Specific Goal (Individualization)  Outcome: Ongoing - Unchanged  Goal: Absence of Hospital-Acquired Illness or Injury  Outcome: Ongoing - Unchanged  Goal: Optimal Comfort and Wellbeing  Outcome: Ongoing - Unchanged  Goal: Readiness for Transition of Care  Outcome: Ongoing - Unchanged  Goal: Rounds/Family Conference  Outcome: Ongoing - Unchanged     Problem: Wound  Goal: Optimal Wound Healing  Outcome: Ongoing - Unchanged     Problem: Fall Injury Risk  Goal: Absence of Fall and Fall-Related Injury  Outcome: Ongoing - Unchanged     Problem: Self-Care Deficit  Goal: Improved Ability to Complete Activities of Daily Living  Outcome: Ongoing - Unchanged

## 2019-08-27 NOTE — Unmapped (Signed)
Pt VSS and afebrile. Pt received scheduled medications. PICC dressing c/d/I. Pt hep locked at 1530. Pt oob walking and in chair. Pt eating and drinking well. Pt agreeable to glucose checks and insulin. BM x2. Adequate urine output.  Grandma at bedside and active in care.         Problem: Adult Inpatient Plan of Care  Goal: Plan of Care Review  Outcome: Ongoing - Unchanged  Goal: Patient-Specific Goal (Individualization)  Outcome: Ongoing - Unchanged  Goal: Absence of Hospital-Acquired Illness or Injury  Outcome: Ongoing - Unchanged  Goal: Optimal Comfort and Wellbeing  Outcome: Ongoing - Unchanged  Goal: Readiness for Transition of Care  Outcome: Ongoing - Unchanged  Goal: Rounds/Family Conference  Outcome: Ongoing - Unchanged     Problem: Wound  Goal: Optimal Wound Healing  Outcome: Ongoing - Unchanged     Problem: Fall Injury Risk  Goal: Absence of Fall and Fall-Related Injury  Outcome: Ongoing - Unchanged     Problem: Self-Care Deficit  Goal: Improved Ability to Complete Activities of Daily Living  Outcome: Ongoing - Unchanged

## 2019-08-28 LAB — COMPREHENSIVE METABOLIC PANEL
ALBUMIN: 2.7 g/dL — ABNORMAL LOW (ref 3.5–5.0)
ALKALINE PHOSPHATASE: 790 U/L — ABNORMAL HIGH (ref 65–260)
ALT (SGPT): 284 U/L — ABNORMAL HIGH (ref <50)
AST (SGOT): 109 U/L — ABNORMAL HIGH (ref 19–55)
BILIRUBIN TOTAL: 33.1 mg/dL — ABNORMAL HIGH (ref 0.0–1.2)
BLOOD UREA NITROGEN: 15 mg/dL (ref 7–21)
BUN / CREAT RATIO: 23
CALCIUM: 7.8 mg/dL — ABNORMAL LOW (ref 8.5–10.2)
CREATININE: 0.66 mg/dL — ABNORMAL LOW (ref 0.70–1.30)
EGFR CKD-EPI AA MALE: >90 mL/min/{1.73_m2} (ref >=60)
EGFR CKD-EPI NON-AA MALE: >90 mL/min/{1.73_m2} (ref >=60)
GLUCOSE RANDOM: 274 mg/dL — ABNORMAL HIGH (ref 70–179)
POTASSIUM: 3.4 mmol/L — ABNORMAL LOW (ref 3.5–5.0)
SODIUM: 134 mmol/L — ABNORMAL LOW (ref 135–145)

## 2019-08-28 LAB — CBC W/ AUTO DIFF
BASOPHILS ABSOLUTE COUNT: 0 10*9/L (ref 0.0–0.1)
BASOPHILS RELATIVE PERCENT: 0.2 %
EOSINOPHILS ABSOLUTE COUNT: 0 10*9/L (ref 0.0–0.4)
EOSINOPHILS RELATIVE PERCENT: 0.1 %
HEMATOCRIT: 29.4 % — ABNORMAL LOW (ref 41.0–53.0)
HEMOGLOBIN: 9.8 g/dL — ABNORMAL LOW (ref 13.5–17.5)
LARGE UNSTAINED CELLS: 0 % (ref 0–4)
LYMPHOCYTES ABSOLUTE COUNT: 0.1 10*9/L — ABNORMAL LOW (ref 1.5–5.0)
LYMPHOCYTES RELATIVE PERCENT: 1.1 %
MEAN CORPUSCULAR HEMOGLOBIN: 28.3 pg (ref 26.0–34.0)
MEAN CORPUSCULAR VOLUME: 84.9 fL (ref 80.0–100.0)
MEAN PLATELET VOLUME: 8.5 fL (ref 7.0–10.0)
MONOCYTES RELATIVE PERCENT: 2.9 %
NEUTROPHILS ABSOLUTE COUNT: 5.7 10*9/L (ref 2.0–7.5)
NEUTROPHILS RELATIVE PERCENT: 95.3 %
PLATELET COUNT: 64 10*9/L — ABNORMAL LOW (ref 150–440)
RED BLOOD CELL COUNT: 3.47 10*12/L — ABNORMAL LOW (ref 4.50–5.90)
WBC ADJUSTED: 6 10*9/L (ref 4.5–11.0)

## 2019-08-28 LAB — INR: Coagulation tissue factor induced.INR:RelTime:Pt:PPP:Qn:Coag: 1.53

## 2019-08-28 LAB — SMEAR REVIEW

## 2019-08-28 LAB — BILIRUBIN DIRECT: Bilirubin.glucuronidated+Bilirubin.albumin bound:MCnc:Pt:Ser/Plas:Qn:: 30.24 — ABNORMAL HIGH

## 2019-08-28 LAB — PROTIME-INR: INR: 1.53

## 2019-08-28 LAB — GAMMA GLUTAMYL TRANSFERASE: Gamma glutamyl transferase:CCnc:Pt:Ser/Plas:Qn:: 486 — ABNORMAL HIGH

## 2019-08-28 LAB — MEAN CORPUSCULAR VOLUME: Lab: 84.9

## 2019-08-28 LAB — MAGNESIUM: Magnesium:MCnc:Pt:Ser/Plas:Qn:: 1.6

## 2019-08-28 LAB — CREATININE: Creatinine:MCnc:Pt:Ser/Plas:Qn:: 0.66 — ABNORMAL LOW

## 2019-08-28 LAB — PHOSPHORUS: Phosphate:MCnc:Pt:Ser/Plas:Qn:: 2.3 — ABNORMAL LOW

## 2019-08-28 LAB — SLIDE REVIEW

## 2019-08-28 NOTE — Unmapped (Signed)
Endocrinology Consult - Follow Up Note    Requesting Attending Physician :  Melba Coon, MD  Service Requesting Consult : Ped Gastroenterology [facility])  Primary Care Provider: [name], MD  Assessment/Recommendations:      Glucocorticoids induced hyperglycemia- A1c 4.9% (10/2018)- Discussed his refusal of insulin and glycemic checks today at length. He agreed to our team that he would take insulin and check sugars.  ??  Plan  - Lantus 15U nightly, half dose if NPO  -??Lispro??8 units TIDAC with meals??if able  -????PRN Lispro dose for overnight meal: if a normal sized meal give 4 units, if smaller give 2 units lispro  - Continue sensitive sliding scale QACHS     ??Discussed with Dr. Marcello Fennel??  ??  Primary team informed of recommendations. We will continue to follow patient as needed. Please call/page [phone] with questions or concerns.     Elpidio Galea, MD  PGY5 Endocrinology Fellow     I was immediately available via phone/pager or present on site.  I reviewed and discussed the case with the resident, but did not see the patient.  I agree with the assessment and plan as documented in the resident's note.     Maximino Greenland, MD, MPH  Attending - Endocrinology, Diabetes, and Metabolism    Time spent reviewing chart 10 minutes  Time spent with fellow 10 minutes        Subjective/24 hour events:    Hyperglycemia all day. Received 31U insulin. Now on 60mg  prednisone    Objective: :  BP 105/62  - Pulse 71  - Temp 37 ??C (Oral)  - Resp 20  - Ht 174.6 cm (5' 8.74)  - Wt 61.8 kg (136 lb 3.9 oz)  - SpO2 100%  - BMI 20.27 kg/m??       Sleeping today did not examine    Test Results    Lab Results   Component Value Date    POCGLU 288 (H) 08/27/2019    POCGLU 266 (H) 08/27/2019    POCGLU 251 (H) 08/26/2019    POCGLU 144 08/26/2019    POCGLU 285 (H) 08/26/2019     Lab Results   Component Value Date    CREATININE 0.66 (L) 08/28/2019    NA 134 (L) 08/28/2019    K 3.4 (L) 08/28/2019    CL 104 08/28/2019    CO2  08/28/2019 Comment:      Specimen icteric.     ANIONGAP  08/28/2019      Comment:      Unable to calculate     CALCIUM 7.8 (L) 08/28/2019    ALBUMIN 2.7 (L) 08/28/2019

## 2019-08-28 NOTE — Unmapped (Signed)
Pt VSS and afebrile, on RA. No c/o pain or nausea. Pt received medications as scheduled. Was not hungry for PM meal as said he ate a lot today. Nightly insulin given. PICC c/d/I infusing IVF at [facility] and abx. AML drawn. Pt oob and walking to chair earlier in night. Pt drinking well, knocked over full urinal overnight, no bm.  Grandmother at bedside earlier in shift. Will ctm and follow POC.     Problem: Adult Inpatient Plan of Care  Goal: Plan of Care Review  Outcome: Progressing     Problem: Adult Inpatient Plan of Care  Goal: Optimal Comfort and Wellbeing  Outcome: Progressing     Problem: Self-Care Deficit  Goal: Improved Ability to Complete Activities of Daily Living  Outcome: Progressing

## 2019-08-28 NOTE — Unmapped (Signed)
Pt VSS and afebrile. Pt received scheduled medications. No pain or nausea. No PRNs given. PICC dressing c/d/i.  Pt oob walking and in chair. Pt eating and drinking well. Pt agreeable to glucose checks and insulin. BM x 2. Adequate urine output.      Problem: Adult Inpatient Plan of Care  Goal: Plan of Care Review  Outcome: Ongoing - Unchanged  Goal: Patient-Specific Goal (Individualization)  Outcome: Ongoing - Unchanged  Goal: Absence of Hospital-Acquired Illness or Injury  Outcome: Ongoing - Unchanged  Goal: Optimal Comfort and Wellbeing  Outcome: Ongoing - Unchanged  Goal: Readiness for Transition of Care  Outcome: Ongoing - Unchanged  Goal: Rounds/Family Conference  Outcome: Ongoing - Unchanged     Problem: Wound  Goal: Optimal Wound Healing  Outcome: Ongoing - Unchanged     Problem: Fall Injury Risk  Goal: Absence of Fall and Fall-Related Injury  Outcome: Ongoing - Unchanged     Problem: Self-Care Deficit  Goal: Improved Ability to Complete Activities of Daily Living  Outcome: Ongoing - Unchanged

## 2019-08-28 NOTE — Unmapped (Signed)
Pediatric Daily Progress Note     Assessment/Plan:     Principal Problem:    Liver transplant rejection (CMS-HCC)  Active Problems:    Recurrent major depressive disorder, in partial remission (CMS-HCC)    Malnutrition of mild degree (CMS-HCC)    History of liver transplant (CMS-HCC)    Transaminitis    Normocytic anemia    Hypoalbuminemia    Steroid-induced hyperglycemia    Pityrosporum folliculitis    Abdominal pain    Homeless    Thrombocytopenia (CMS-HCC)    Hypomagnesemia    Hypophosphatasia    Other ascites    MSSA bacteremia    Hyperbilirubinemia    Impetigo lesions of Right Ear and Posterior Neck    Steroid-induced acne    Liver dysfunction secondary to transplant rejection     Irritant contact dermatitis of the hands    Sleep difficulties    Strep Mitis Central line-associated bloodstream infection  Resolved Problems:    Cholangitis of transplanted liver (CMS-HCC)    Melena    Dark stools    Tarius??is a 18 y.o.??male??with history of??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 Along with steroids, tacrolimus, and cellcept, thyroglobulin treatment was initiated (on 10/01-10/12) but yielded limited benefit in liver studies to date. Patient has discussed prognosis with Dr. Melrose Nakayama and palliative care including the possibility of not recovering from liver rejection (10/9). Patient understands that transplant is an option but that he may not be a candidate. His main active issue currently is treating CLABSI secondary to strep mitis and MSSA (found after cultures obtained secondary to leukocytosis on daily labs) with Unasyn for a total of a 14 day course (through 10/27) and salvaging his PICC line for now given difficulty with access. We are currently holding all immunosuppression (thymoblobulin, Cellcept, tacrolimus), weaning steroids, and holding off on a repeat liver biopsy pending adequate treatment of his bloodstream infection. We will reconsider starting immunosuppression after one week of antibiotics (~10/23). His other active issues are complications secondary to liver dysfunction (hypoalbuminemia, ascites, edema, abdominal pain, elevated INR, electrolyte abnormalities). Currently his ascites/edema is stable; we will continue to monitor closely and consider albumin/lasix as needed. He also has steroid-induced hyperglycemia, currently being managed on insulin with daily adjustments.     #Hx Liver Transplant - Subacute/Acute Liver Failure: ??Pt has biopsy confirmed severe acute rejection (9/25), RAI 9/9. Liver labs without a significant improvement after immunosuppression.   - tranplant team recommending repeat liver biopsy, but delaying given CLABSI infection (plan to reschedule once adequately treated)  -S/p 12 days of ATG and high dose MethylPrednisolone (10/1-10/12) with poor overall repsonse to therapy   - Will continue to hold IS therapy (Tacro and Mycophenolate and ATG)  - Per ID, OK to restart on 10/23 (s/p 10 days of antibiotics), but considering restarting treatment earlier if the situation warrants   - Steroids: Continue prednisone taper today, 60mg  PO today    - anticipate weaning down to 40mg  on 10/19 per Dr. Melrose Nakayama  - Planning repeat liver Bx for 10/20-10/21   - Will contact [facility] Monday for scheduling and INR/plt count pre-procedure requirements    - F/u path send outs for donor Ab staining   - Continue Valgancyclovir 450mg  daily (3months), Bactrim MWF (65mo) for prophylaxis (10/2-) and Nystatin 10ml TID (28mo) prophylaxis (10/2-)  - vitamin K IV daily without change    -  will discuss IV to PO with Dr. Melrose Nakayama   - Protonix 40 mg PO daily  - Continue Rifaximin (given elevated ammonia, no encephalopathy)   - PT/INR on M/Th     #CLABSI: Strep mitis and MSSA   Developed sudden leukocytosis with white count elevated to 17.3 10/12, afebrile, non-toxic appearing, without localizing symptoms. Found to have positive Bcx 10/12 growing strep mitis and MSSA. Given ascites and risk for SBP secondary to strep mitis, also covering for anaerobes. Echo negative for endocarditis   - 10/12 peripheral/PICC Cx positive for Strep mitis   - 1/012 peripheral - +MSAA  - f/u Bcx:   - 10/12 - NGTD   - 10/13 - NGTD   - 10/14 - NGTD  - Per ID recs:   -Unasyn x 14 days total (10/13-10/27)   - Given unreliable PIV access, will attempt to salvage his PICC while treating his infection   - If afebrile and no new +BCx, may resume immunosupression regimen s/p 10d of abx (10/13-10/23)   - Draw new bcx if any repeat cultures turn positive   - CBCd frequency reduced to M/Th    #Edema, ascites, abdominal pain secondary to liver dysfunction  Ascites still present, stable.   - Holding IVFs, taking good PO  - Following daily weights (Weight up ~1lb today)  - STRICT I/Os      #hypoalbuminemia  - likely contributing to ascites/edema  - responded to albumin/lasix spot doing, will consider for worsening edema    #Nutriton: severe protein-calorie malnutrition. Prior to admission, patient has been skipping meals due to poor appetite and food insecurity.  - Continue goal PO fluid intake of 1.6L   - Regular diet +  Ensure supplements   - Ergocalciferol qMon   - aquadex multivitamin    - Ca 300 mg BID    #Electrolyte abnormalities: hypomagnesemia, hypophosphatemia, hyponatremia (corrected to 137 with hyperglycemia),   - Daily CMP, Mg, Phos  - hypomagnesemia   - mag sulphate IV prn (10/15, 10/17)  - hypophosphatemia    - k-phos daily (10/16-), increased to BID on 10/18 (Serum phos 2.3 today)    #constipation  - Miralax 17 g BID PRN    #Hyperglycemia   -insulin/accucheck compliance has been an issue this week, with better compliance over the past 24 hours, and BG levels have been improving  -New Insulin regiment per endocrine recs (10/18)   - Lantus 15U nightly (1/2 dose if NPO) (increased 10/16)   -??Lispro?? 8 units TIDAC with meals??(increased 10/18)   -??PRN Lispro dose for overnight meal: if a normal sized meal give 4 units, if smaller give 2 units lispro   - Continue sensitive SSI QACHS    #Thrombocytopenia  - s/p plt transfusion on 10/13, 10/15 for PLT of 50k     #Impetigo Skin Lesions on Right Ear and Left posterior Neck, + MSSA, healing well   - Derm consulted. Appreciate recs   - Mupirocin ointment for both lesions until healed, per derm    #steroid-induced acne, possible fungal folliculitis   - topical clindamycin, benzoyl peroxide, Ketoconazole shampoo    ??  #Major Depressive Disorder, insomnia: Stable. Has hx of attempted overdoses, with the last attempt in April 2019??at which point he was hospitalized in the Adolescent Psychiatry unit and discharged on Lexapro 20 mg. ??He has not engaged with mental health resources or taken medication for depression or insomnia in 8-10 months.????Denies SI, passive suicidality, self-harm, or HI at this time.    -  Patient saw adult psychiatry team again on 10/13. Offered SSRI, pt still considering   - will only see adult psychiatry   - melatonin 6 mg 1 hour before bedtime    #Deconditioning. Patient endorses feeling weak and having limited mobility.   -PT following  -Encourage OOB and ambulation    #Homelessness - Social Concerns:??Kent has experienced homelessness for ~5 months when he left his mother's house. ??He has severed all ties with his family and states his relationship with his mother cannot be reconciled. ??He also requested contact information of family members to be erased from his chart (this has been completed, but social worker did drop contact info into her note on 9/24) and he more recently has reconciled with some family members. ??In addition to food insecurity, he has not been able to secure medications. ??He is routinely exposed to alcohol and drugs (marijuana, cocaine, etc.) second-hand through his friendships though he denies current use himself. ??On urine drug screen, he tested positive for marijuana only.  -Supportive care following.  -Designated a personal friend [name]) and grandfather to be his HCPOAs (Friend is the Primary).   -Spoke with sister on 10/16 (has not been in regular contact)   -Paternal grandmother now visiting regularly   ??  Access: PICC (placed 9/28), will need removal if he becomes unstable in the setting of CLABSI infection   ??  Discharge criteria:??liver transplant work-up and treatment of acute rejection    Subjective:     Interval History:   Patient lying flat in bed. In excellent mood this am, thankful for epsom salt bath. Continues to eat will and has been more compliant with insulin. He has also been more active, OOB and into chair.     Objective:     Vital signs in last 24 hours:  Temp:  [36.7 ??C-37 ??C] 37 ??C  Heart Rate:  [61-85] 81  Resp:  [18-20] 18  BP: (105-127)/(61-88) 108/61  MAP (mmHg):  [75-95] 84  SpO2:  [99 %-100 %] 100 %  Intake/Output last 3 shifts:  I/O last 3 completed shifts:  In: 2224.5 [P.O.:1300; I.V.:924.5]  Out: 3651 [Urine:3651]    Physical Exam:   General:   Sitting up in bed, awake. Thin appearing. Strength appears improved today.   Head: normocephalic, atraumatic.  Eyes: EOMI. Marked scleral icterus present.   Nose:   clear, no discharge  Lungs: clear breath sounds bilaterally. No increased work of breathing. No wheezes/crackles. Good aeration at the bases.   Heart: regular rate and rhythm. No murmurs appreciated on auscultation.  Abdomen:  Abdomen, soft, mild TTP over the right side. Ascites present. No HSM.   Neuro: deconditioned, moves all extremities with decreased strength.   Extremities: non-pitting edema in the LE, WWP  Skin: Skin lesions on left neck, healing well.  Skin lesion present on right ear with scabbed. Acneform rash present on face, back, chest.     Active Medications reviewed and KEY Medications include:     Current Facility-Administered Medications:   ???  ampicillin-sulbactam dilution (UNASYN) injection 3 g, 3 g, Intravenous, Q6H, David Stall, MD, 3 g at 08/28/19 0901  ???  benzoyl peroxide 5 % gel, , Topical, Nightly, Alfredia Ferguson, MD  ???  calcium carbonate (OS-CAL) tablet 300 mg elem calcium, 300 mg elem calcium, Oral, BID, David Stall, MD, 300 mg elem calcium at 08/28/19 0900  ???  clindamycin (CLEOCIN T) 1 % lotion 1 application, 1 application, Topical, daily, Alfredia Ferguson, MD,  1 application at 08/26/19 1640  ???  dextrose 50 % in water (D50W) 50 % solution 12.5 g, 12.5 g, Intravenous, Q10 Min PRN, David Stall, MD  ???  ergocalciferol (DRISDOL) capsule 50,000 Units, 50,000 Units, Oral, Weekly, David Stall, MD, 50,000 Units at 08/22/19 0848  ???  heparin preservative-free injection 10 units/mL syringe (HEPARIN LOCK FLUSH), 20 Units, Intravenous, Daily PRN, David Stall, MD, 20 Units at 08/27/19 1524  ???  influenza vaccine quad (FLUARIX, FLULAVAL, FLUZONE) (6 MOS & UP) 2020-21, 0.5 mL, Intramuscular, During hospitalization, David Stall, MD  ???  insulin glargine (LANTUS) injection 15 Units, 15 Units, Subcutaneous, Nightly, David Stall, MD, 15 Units at 08/27/19 2107  ???  insulin lispro (HumaLOG) injection 0-5 Units, 0-5 Units, Subcutaneous, ACHS, David Stall, MD, 1 Units at 08/28/19 1047  ???  insulin lispro (HumaLOG) injection 2-4 Units, 2-4 Units, Subcutaneous, Nightly PRN, David Stall, MD  ???  insulin lispro (HumaLOG) injection 6 Units, 6 Units, Subcutaneous, TID AC, David Stall, MD, 6 Units at 08/28/19 1046  ???  ketoconazole (NIZORAL) 2 % shampoo, , Topical, daily, Alfredia Ferguson, MD  ???  melatonin tablet 6 mg, 6 mg, Oral, QPM, David Stall, MD, 6 mg at 08/27/19 2100  ???  multivitamin, with zinc (AQUADEKS) chewable tablet, 2 tablet, Oral, Daily, David Stall, MD, 2 tablet at 08/28/19 0900  ??? mupirocin (BACTROBAN) 2 % ointment, , Topical, TID, David Stall, MD  ???  nystatin (MYCOSTATIN) oral suspension, 1,000,000 Units, Oral, TID, David Stall, MD, 1,000,000 Units at 08/28/19 0859  ???  ondansetron (ZOFRAN-ODT) disintegrating tablet 4 mg, 4 mg, Oral, Q8H PRN, David Stall, MD  ???  pantoprazole (PROTONIX) EC tablet 40 mg, 40 mg, Oral, Daily, David Stall, MD, 40 mg at 08/27/19 2106  ???  phytonadione (vitamin K1) (AQUA-MEPHYTON) 7.5 mg in sodium chloride (NS) 0.9 % 50 mL IVPB, 7.5 mg, Intravenous, Daily, Lewie Loron, MD, Last Rate: 173.3 mL/hr at 08/28/19 0958, 7.5 mg at 08/28/19 0958  ???  polyethylene glycol (MIRALAX) packet 17 g, 17 g, Oral, BID PRN, David Stall, MD  ???  potassium phosphate (monobasic) (K-PHOS) tablet 500 mg, 500 mg, Oral, BID, Val Eagle  ???  predniSONE (DELTASONE) tablet 60 mg, 60 mg, Oral, Daily, David Stall, MD, 60 mg at 08/28/19 0859  ???  rifAXIMin (XIFAXAN) tablet 550 mg, 550 mg, Oral, BID, David Stall, MD, 550 mg at 08/28/19 0900  ???  sodium chloride (NS) 0.9 % infusion, , Intravenous, Continuous, David Stall, MD, Last Rate: 10 mL/hr at 08/26/19 0537, 1,000 mL at 08/27/19 0731  ???  sodium chloride (NS) 0.9 % infusion, 10 mL/hr, Intravenous, Continuous, Ejiofor A Lucky Cowboy., MD, Last Rate: 10 mL/hr at 08/27/19 2101, 10 mL/hr at 08/27/19 2101  ???  sulfamethoxazole-trimethoprim (BACTRIM) 400-80 mg tablet 80 mg of trimethoprim, 1 tablet, Oral, Q MWF, David Stall, MD, 80 mg of trimethoprim at 08/26/19 0915  ???  valGANciclovir (VALCYTE) tablet 450 mg, 450 mg, Oral, Daily, David Stall, MD, 450 mg at 08/27/19 1647        Studies: Personally reviewed and interpreted.  Labs/Studies:  Labs and Studies from the last 24hrs per EMR and Reviewed and   All lab results last 24 hours:    Recent Results (from the past 24 hour(s))   POCT Glucose    Collection Time: 08/27/19  3:21 PM Result Value Ref Range  Glucose, POC 288 (H) 70 - 179 mg/dL   Comprehensive Metabolic Panel    Collection Time: 08/28/19  4:56 AM   Result Value Ref Range    Sodium 134 (L) 135 - 145 mmol/L    Potassium 3.4 (L) 3.5 - 5.0 mmol/L    Chloride 104 98 - 107 mmol/L    Anion Gap      CO2      BUN 15 7 - 21 mg/dL    Creatinine 1.61 (L) 0.70 - 1.30 mg/dL    BUN/Creatinine Ratio 23     EGFR CKD-EPI Non-African American, Male >90 >=60 mL/min/1.44m2    EGFR CKD-EPI African American, Male >90 >=60 mL/min/1.42m2    Glucose 274 (H) 70 - 179 mg/dL    Calcium 7.8 (L) 8.5 - 10.2 mg/dL    Albumin 2.7 (L) 3.5 - 5.0 g/dL    Total Protein      Total Bilirubin 33.1 (H) 0.0 - 1.2 mg/dL    AST 096 (H) 19 - 55 U/L    ALT 284 (H) <50 U/L    Alkaline Phosphatase 790 (H) 65 - 260 U/L   Gamma GT (GGT)    Collection Time: 08/28/19  4:56 AM   Result Value Ref Range    GGT 486 (H) 12 - 109 U/L   Magnesium Level    Collection Time: 08/28/19  4:56 AM   Result Value Ref Range    Magnesium 1.6 1.6 - 2.2 mg/dL   Phosphorus Level    Collection Time: 08/28/19  4:56 AM   Result Value Ref Range    Phosphorus 2.3 (L) 2.9 - 4.7 mg/dL   PT-INR    Collection Time: 08/28/19  4:56 AM   Result Value Ref Range    PT 17.8 (H) 10.2 - 13.1 sec    INR 1.53    Bilirubin, Direct    Collection Time: 08/28/19  4:56 AM   Result Value Ref Range    Bilirubin, Direct 30.24 (H) 0.00 - 0.40 mg/dL   CBC w/ Differential    Collection Time: 08/28/19  4:56 AM   Result Value Ref Range    WBC 6.0 4.5 - 11.0 10*9/L    RBC 3.47 (L) 4.50 - 5.90 10*12/L    HGB 9.8 (L) 13.5 - 17.5 g/dL    HCT 04.5 (L) 40.9 - 53.0 %    MCV 84.9 80.0 - 100.0 fL    MCH 28.3 26.0 - 34.0 pg    MCHC 33.3 31.0 - 37.0 g/dL    RDW 81.1 (H) 91.4 - 15.0 %    MPV 8.5 7.0 - 10.0 fL    Platelet 64 (L) 150 - 440 10*9/L    Neutrophils % 95.3 %    Lymphocytes % 1.1 %    Monocytes % 2.9 %    Eosinophils % 0.1 %    Basophils % 0.2 %    Neutrophil Left Shift 1+ (A) Not Present    Absolute Neutrophils 5.7 2.0 - 7.5 10*9/L Absolute Lymphocytes 0.1 (L) 1.5 - 5.0 10*9/L    Absolute Monocytes 0.2 0.2 - 0.8 10*9/L    Absolute Eosinophils 0.0 0.0 - 0.4 10*9/L    Absolute Basophils 0.0 0.0 - 0.1 10*9/L    Large Unstained Cells 0 0 - 4 %    Microcytosis Marked (A) Not Present    Macrocytosis Moderate (A) Not Present    Anisocytosis Marked (A) Not Present    Hypochromasia Slight (A) Not Present   Morphology  Review    Collection Time: 08/28/19  4:56 AM   Result Value Ref Range    Smear Review Comments See Comment (A) Undefined    Target Cells Moderate (A) Not Present    Poikilocytosis Moderate (A) Not Present   POCT Glucose    Collection Time: 08/28/19 10:42 AM   Result Value Ref Range    Glucose, POC 160 70 - 179 mg/dL     ========================================    Steven C. Belva Chimes, MD, PhD   Anesthesiology, PGY-1     I supervised the resident physician on subsequent day care who spent at least 35 minutes on the floor or unit in direct patient care. The direct patient care time included face-to-face time with the patient, reviewing the patient's chart, communicating with the family and/or other professionals and coordinating care. Greater than 50% of this time was spent in counseling or coordinating care with the patient regarding regarding diagnoses, planned testing, relevant test results, and treatment recommendations. I was available throughout care provided.   Alita Chyle, MD

## 2019-08-29 LAB — COMPREHENSIVE METABOLIC PANEL
ALBUMIN: 2.5 g/dL — ABNORMAL LOW (ref 3.5–5.0)
ALKALINE PHOSPHATASE: 785 U/L — ABNORMAL HIGH (ref 65–260)
AST (SGOT): 126 U/L — ABNORMAL HIGH (ref 19–55)
BILIRUBIN TOTAL: 35 mg/dL — ABNORMAL HIGH (ref 0.0–1.2)
BLOOD UREA NITROGEN: 13 mg/dL (ref 7–21)
CALCIUM: 7.6 mg/dL — ABNORMAL LOW (ref 8.5–10.2)
CHLORIDE: 107 mmol/L (ref 98–107)
CREATININE: 0.63 mg/dL — ABNORMAL LOW (ref 0.70–1.30)
EGFR CKD-EPI AA MALE: >90 mL/min/{1.73_m2} (ref >=60)
EGFR CKD-EPI NON-AA MALE: >90 mL/min/{1.73_m2} (ref >=60)
POTASSIUM: 3.1 mmol/L — ABNORMAL LOW (ref 3.5–5.0)
SODIUM: 136 mmol/L (ref 135–145)

## 2019-08-29 LAB — CBC W/ AUTO DIFF
HEMATOCRIT: 28.6 % — ABNORMAL LOW (ref 41.0–53.0)
HEMOGLOBIN: 9.8 g/dL — ABNORMAL LOW (ref 13.5–17.5)
MEAN CORPUSCULAR HEMOGLOBIN: 28.7 pg (ref 26.0–34.0)
MEAN CORPUSCULAR VOLUME: 83.8 fL (ref 80.0–100.0)
MEAN PLATELET VOLUME: 7.2 fL (ref 7.0–10.0)
PLATELET COUNT: 59 10*9/L — ABNORMAL LOW (ref 150–440)
RED CELL DISTRIBUTION WIDTH: 29.2 % — ABNORMAL HIGH (ref 12.0–15.0)
WBC ADJUSTED: 4.8 10*9/L (ref 4.5–11.0)

## 2019-08-29 LAB — GAMMA GLUTAMYL TRANSFERASE: Gamma glutamyl transferase:CCnc:Pt:Ser/Plas:Qn:: 500 — ABNORMAL HIGH

## 2019-08-29 LAB — URINALYSIS
GLUCOSE UA: 150 — AB
PH UA: 6 (ref 5.0–9.0)
PROTEIN UA: NEGATIVE
RBC UA: 1 /HPF (ref <=3)
SPECIFIC GRAVITY UA: 1.011 (ref 1.003–1.030)
SQUAMOUS EPITHELIAL: <1 /HPF (ref 0–5)

## 2019-08-29 LAB — MANUAL DIFFERENTIAL
BASOPHILS - ABS (DIFF): 0 10*9/L (ref 0.0–0.1)
BASOPHILS - REL (DIFF): 0 %
EOSINOPHILS - REL (DIFF): 0 %
LYMPHOCYTES - ABS (DIFF): 0.1 10*9/L — ABNORMAL LOW (ref 1.5–5.0)
LYMPHOCYTES - REL (DIFF): 3 %
MONOCYTES - ABS (DIFF): 0.2 10*9/L (ref 0.2–0.8)
NEUTROPHILS - ABS (DIFF): 4.4 10*9/L (ref 2.0–7.5)
NEUTROPHILS - REL (DIFF): 92 %

## 2019-08-29 LAB — MEAN CORPUSCULAR HEMOGLOBIN CONC: Lab: 34.2

## 2019-08-29 LAB — CALCIUM IONIZED VENOUS (MG/DL): Calcium.ionized:MCnc:Pt:Bld:Qn:: 4.44

## 2019-08-29 LAB — POTASSIUM URINE: Lab: 9.1

## 2019-08-29 LAB — MAGNESIUM: Magnesium:MCnc:Pt:Ser/Plas:Qn:: 1.5 — ABNORMAL LOW

## 2019-08-29 LAB — PHOSPHORUS  URINE: Lab: 23.2

## 2019-08-29 LAB — INR: Coagulation tissue factor induced.INR:RelTime:Pt:PPP:Qn:Coag: 1.55

## 2019-08-29 LAB — LEUKOCYTE ESTERASE UA

## 2019-08-29 LAB — PHOSPHORUS: Phosphate:MCnc:Pt:Ser/Plas:Qn:: 2.1 — ABNORMAL LOW

## 2019-08-29 LAB — BILIRUBIN DIRECT: Bilirubin.glucuronidated+Bilirubin.albumin bound:MCnc:Pt:Ser/Plas:Qn:: 29.4 — ABNORMAL HIGH

## 2019-08-29 LAB — ANION GAP: Anion gap 3:SCnc:Pt:Ser/Plas:Qn:: 0

## 2019-08-29 LAB — NEUTROPHILS - ABS (DIFF): Neutrophils:NCnc:Pt:Bld:Qn:: 4.4

## 2019-08-29 LAB — PROTIME-INR: INR: 1.55

## 2019-08-29 LAB — MAGNESIUM URINE: Lab: 3.1

## 2019-08-29 NOTE — Unmapped (Signed)
Pediatric Tacrolimus Therapeutic Monitoring Pharmacy Note  ??  Erskine??[name]??is a 18 y.o.??male??restarting??tacrolimus.   ??  Indication:??Liver transplant??  ??  Date of Transplant: 09/22/2016??  ??  Current Dosing Information: Tacrolimus??3 mg PO BID  ??  Dosing Weight: 61.8 kg  ??  Goals:  Therapeutic Drug Levels  Tacrolimus trough goal: 8-10 ng/mL per primary team resident  ??  Additional Clinical Monitoring/Outcomes  ?? Monitor renal function (SCr and urine output) and liver function (LFTs)  ?? Monitor for signs/symptoms of adverse events (e.g., hyperglycemia, hyperkalemia, hypomagnesemia, hypertension, headache, tremor)  ??  Results: not applicable  ??  Longitudinal Dose Monitoring:  Date Dose (mg)  AM / PM AM Scr (mg/dL) Level  (ng/mL) Key Drug Interactions   08/29/19 -- / 3 mg 0.63 -- None   08/22/19 1.5 mg/ 3 mg 0.79 3.9 None   08/20/19 0.75 mg / 1.5 mg 0.98 2.7 None   08/19/19 0.75 mg / 0.75 mg 0.95 3.4 None   08/18/19 0.5 mg / 0.75 mg 1.13 2.6 None   08/17/19 0.5 mg / 0.5 mg ??1.12 3.4 None   08/16/19 0.25 mg / 0.5 mg 1.09 3.2 None   08/15/19 0.25 mg/0.5 mg 1.04 4.2 None   08/14/19 0.25 mg/0.5 mg 1.06 4.4 None   08/13/19 0.25 mg/0.25 mg 1.28 5.5 None   08/12/19 --- / 0.25 mg 1.25 5.9 None   08/11/19 0.5 mg /??--- ??1.26 ??10.1 ??None   08/10/19 1 mg / HOLD 1.4 10.5 None   08/09/19 1 mg / 1 mg 1.29 --- None   08/08/19 3 mg / HOLD 1.5 11.9 None   08/06/19 5 mg / 3 mg 0.8 9 None   ??  Pharmacokinetic Considerations and Significant Drug Interactions:  ?? Concurrent hepatotoxic medications: None identified  ?? Concurrent CYP3A4 substrates/inhibitors: None identified  ?? Concurrent nephrotoxic medications: Valganciclovir, SMP/TMX  ??  Assessment/Plan: Gaius's immunosuppression has been held due to infection, but is now restarting. Prior to discontinuing, he had a trough level of 3.9 ng/mL on 1.5 mg BID, with a goal of 8-10 ng/mL. Because of this, his regimen was increased to 3 mg BID immediately prior to discontinuation. We will restart 3 mg BID today and check a level on 10/21 AM (2 days) to make sure he is trending appropriately.     Recommendedation(s)  ?? Restart regimen of 3 mg (0.05 mg/kg) every 12 hours   ?? Obtain next tacrolimus level in 2 days.Marland Kitchen  ??  Follow-up  ?? Next level ordered: 08/31/19 at 0900.  ?? A pharmacist will continue to monitor and recommend levels as appropriate  ??  The above plan was discussed and approved by Gildardo Griffes, MD.    Please page service pharmacist with questions/clarifications.    Selmer Dominion, PharmD  Pediatric Clinical Pharmacist  [phone] (pager)  [phone] (pediatric pharmacy)

## 2019-08-29 NOTE — Unmapped (Signed)
Pt afebrile and VSS this shift. Pt did not complain of pain today. Central line site and dressing clean, dry, and intact. Fluids infusing. Pt eating, drinking, and voiding well. He had a BM this shift. Pt resting in bed with Grandma at bedside, active in care. Will continue to monitor pt and follow POC.

## 2019-08-29 NOTE — Unmapped (Signed)
Endocrinology Consult - Follow Up Note    Requesting Attending Physician :  Melba Coon, MD  Service Requesting Consult : Ped Gastroenterology [facility])  Primary Care Provider: [name], MD  Assessment/Recommendations:      Glucocorticoids induced hyperglycemia- A1c 4.9% (10/2018)- Discussed his refusal of insulin and glycemic checks today at length. He agreed to our team that he would take insulin and check sugars.  ??  Plan  - Lantus 12U nightly, half dose if NPO  -??Lispro??6 units TIDAC with meals??if able  -????PRN Lispro dose for overnight meal: if a normal sized meal give 4 units, if smaller give 2 units lispro  - Continue sensitive sliding scale QACHS     ??Discussed with Dr. Leda Roys   ??  Primary team informed of recommendations. We will continue to follow patient as needed. Please call/page [phone] with questions or concerns.     Elpidio Galea, MD  PGY5 Endocrinology Fellow      Subjective/24 hour events:    Tighter glucose control with reduction in steroids.    Objective: :  BP 111/71  - Pulse 72  - Temp 36.8 ??C (Oral)  - Resp 18  - Ht 174.6 cm (5' 8.74)  - Wt 61.8 kg (136 lb 3.9 oz)  - SpO2 100%  - BMI 20.27 kg/m??       Not seen in person today    Test Results    Lab Results   Component Value Date    POCGLU 212 (H) 08/29/2019    POCGLU 317 (H) 08/28/2019    POCGLU 160 08/28/2019    POCGLU 288 (H) 08/27/2019    POCGLU 266 (H) 08/27/2019     Lab Results   Component Value Date    CREATININE 0.63 (L) 08/29/2019    NA 136 08/29/2019    K 3.1 (L) 08/29/2019    CL 107 08/29/2019    CO2  08/29/2019      Comment:      Specimen icteric.    ANIONGAP  08/29/2019      Comment:      Unable to Calculate.    CALCIUM 7.6 (L) 08/29/2019    ALBUMIN 2.5 (L) 08/29/2019

## 2019-08-29 NOTE — Unmapped (Signed)
Pediatric Infectious Disease Progress Note    ASSESSMENT    [name] Gross is a 18 y.o. male??with leukocytosis and strep mitis/staph aureus bacteremia in the setting of acute liver transplant rejection/failure. [name] has been hospitalized with culture-confirmed acute rejection since 9/24, and has been receiving high-dose immunosuppression with thymoglobulin, steroids, tacrolimus, and mycophenolate, with only modest improvement of his liver function. Ascites has increased throughout this time as well. On 10/12 he was found to have leukocytosis without any localizing symptoms of infections, including no fever, no new/changed abdominal pain, and no N/V/D. Broad coverage was initiated with ceftriaxone, flagyl, micafungin, and vancomycin. Peripheral??as well as??central line blood cultures have been positive for??strep??mitis, and now staph aureus from a peripheral culture??suggestive of a systemic source. The patient did have a culture from a R ear lesion positive for MSSA on 10/5. The source of staph aureus bacteremia might also be his steroid-induced acne, given his significantly immunosuppressed state. Although the staph aureus did have late growth (>48hrs), it is likely to represent a true infection, not a contaminant, given it was from a peripheral source (not the central line).   ??  Although the patient does have a PICC in place (since 9/28), it is also possible that there is a systemic source  including SBP, as strep mitis is a known causative agent of SBP in patients with liver failure. However, given that a paracentesis would require sedation or anesthesia, at this time we can continue monitoring closely while on antibiotics  treatment. In addition, a bacterial peritonitis would likely be monomicrobial and covered by the current antibiotic regimen. May also leave the PICC in place and treat through the line, given the difficulty of obtaining alternative access at this time.     RECOMMENDATIONS  Continue with Unasyn for treatment of both strep mitis and MSSA (10/13-10/27).  Blood cultures from 10/13 20 - 08/25/19 have been no growth to date and patient has been well  -If any further fevers or elevated white cell count would repeat blood cultures.  -If the patient continues to be afebrile with negative cultures, recommend continuing to hold immunosuppression for ten days of treatment (10/13-10/23).   -Given difficulty of paracentesis, closely monitor for signs of bacterial peritonitis including fever, worsening abdominal pain, leukocytosis, altered mentation, or diarrhea.   -May continue treating through the PICC line.  -If any fevers clinical decompensation or recurrent bacteremia the PICC line will have to be removed     Thank you for asking [place] to see [place] [name]. We will sign off at this time. Please do not hesitate to call with any additional questions or concerns.  Lynann Beaver, MD  Pediatric Infectious Diseases    SUBJECTIVE  Interval History: [name] Gross  reports that he is feeling well today.  He has no fevers and complains of no abdominal pain  History provided by patient.    Current antibiotics:  Anti-infectives (From admission, onward)    Start     Dose/Rate Route Frequency Ordered Stop    08/26/19 2100  ampicillin-sulbactam dilution (UNASYN) injection 3 g      3 g  over 30 Minutes Intravenous Every 6 hours 08/26/19 1626 09/06/19 2059    08/22/19 2100  rifAXIMin (XIFAXAN) tablet 550 mg      550 mg Oral 2 times a day (standard) 08/22/19 1417 09/21/19 2059    08/15/19 0900  sulfamethoxazole-trimethoprim (BACTRIM) 400-80 mg tablet 80 mg of trimethoprim      1 tablet Oral Every Mon-Wed-Fri 08/13/19  5409 02/13/20 0859    08/12/19 1515  valGANciclovir (VALCYTE) tablet 450 mg      450 mg Oral Daily (standard) 08/12/19 1512 11/09/19 1659    08/12/19 1500  nystatin (MYCOSTATIN) oral suspension      1,000,000 Units Oral 3 times a day (standard) 08/12/19 1453 09/11/19 1359          Other medications reviewed OBJECTIVE    Vital signs in last 24 hours:  Temp:  [36.5 ??C (97.7 ??F)-37 ??C (98.6 ??F)] 36.9 ??C (98.4 ??F)  Heart Rate:  [61-91] 78  Resp:  [18-20] 20  BP: (105-118)/(61-71) 113/71  MAP (mmHg):  [75-84] 84  SpO2:  [99 %-100 %] 100 %    Physical Exam:  Constitutional:??L, alert, interactive, pleasant. Energetic, comfortable.   Head:??Normocephalic atraumatic  Eyes:??Icteric sclera, no discharge  Ears/Nose/Mouth/Throat:??No nasal discharge. Tongue and oropharynx icteric. Posterior oropharynx with white/pink plaques.   Neck:??Supple, full ROM.   Respiratory:??Clear to auscultation,??good air entry in all fields. No wheezing, crackles or rhonchi, breathing unlabored.??  Cardiovascular:??Regular rate and rhythm, normal S1/S2, no murmurs appreciated. Peripheral perfusion intact. Edema of lower extremities bilaterally, unchanged.  Gastrointestinal:??Distended but soft, no fluid wave elicited. Abdomen a Tenderness to deeper palpation of LUQ a.Marland Kitchen??Difficult to appreciate hepatomegaly given ascites. No other masses appreciated.   Neurologic:??Alert, oriented. Speech is clear. CN II-XII grossly intact. Mild tremor of hands stable from previous. Small amplitude asterixis vs tremor. Sensation, motor intact.??  Musculoskeletal:??Extremities warm and well-perfused, moves all extremities equally.??Edema of LE bilaterally, nonpitting.   Skin:??Significant jaundice throughout. 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 Chest, face with fine hyperpigmented vesicular vs papular rash. R ear dressed, dressing c/d/i. No new lesions, bruises noted.   Psychiatric: Appropriate for age, pleasant, interactive.??Talkative and cheerful this morning.   Labs:  All lab results last 24 hours:    Recent Results (from the past 24 hour(s))   Comprehensive Metabolic Panel    Collection Time: 08/28/19  4:56 AM   Result Value Ref Range    Sodium 134 (L) 135 - 145 mmol/L    Potassium 3.4 (L) 3.5 - 5.0 mmol/L    Chloride 104 98 - 107 mmol/L    Anion Gap      CO2      BUN 15 7 - 21 mg/dL    Creatinine 8.11 (L) 0.70 - 1.30 mg/dL    BUN/Creatinine Ratio 23     EGFR CKD-EPI Non-African American, Male >90 >=60 mL/min/1.63m2    EGFR CKD-EPI African American, Male >90 >=60 mL/min/1.57m2    Glucose 274 (H) 70 - 179 mg/dL    Calcium 7.8 (L) 8.5 - 10.2 mg/dL    Albumin 2.7 (L) 3.5 - 5.0 g/dL    Total Protein      Total Bilirubin 33.1 (H) 0.0 - 1.2 mg/dL    AST 914 (H) 19 - 55 U/L    ALT 284 (H) <50 U/L    Alkaline Phosphatase 790 (H) 65 - 260 U/L   Gamma GT (GGT)    Collection Time: 08/28/19  4:56 AM   Result Value Ref Range    GGT 486 (H) 12 - 109 U/L   Magnesium Level    Collection Time: 08/28/19  4:56 AM   Result Value Ref Range    Magnesium 1.6 1.6 - 2.2 mg/dL   Phosphorus Level    Collection Time: 08/28/19  4:56 AM   Result Value Ref Range    Phosphorus 2.3 (L) 2.9 - 4.7 mg/dL   PT-INR  Collection Time: 08/28/19  4:56 AM   Result Value Ref Range    PT 17.8 (H) 10.2 - 13.1 sec    INR 1.53    Bilirubin, Direct    Collection Time: 08/28/19  4:56 AM   Result Value Ref Range    Bilirubin, Direct 30.24 (H) 0.00 - 0.40 mg/dL   CBC w/ Differential    Collection Time: 08/28/19  4:56 AM   Result Value Ref Range    WBC 6.0 4.5 - 11.0 10*9/L    RBC 3.47 (L) 4.50 - 5.90 10*12/L    HGB 9.8 (L) 13.5 - 17.5 g/dL    HCT 60.4 (L) 54.0 - 53.0 %    MCV 84.9 80.0 - 100.0 fL    MCH 28.3 26.0 - 34.0 pg    MCHC 33.3 31.0 - 37.0 g/dL    RDW 98.1 (H) 19.1 - 15.0 %    MPV 8.5 7.0 - 10.0 fL    Platelet 64 (L) 150 - 440 10*9/L    Neutrophils % 95.3 %    Lymphocytes % 1.1 %    Monocytes % 2.9 %    Eosinophils % 0.1 %    Basophils % 0.2 %    Neutrophil Left Shift 1+ (A) Not Present    Absolute Neutrophils 5.7 2.0 - 7.5 10*9/L    Absolute Lymphocytes 0.1 (L) 1.5 - 5.0 10*9/L    Absolute Monocytes 0.2 0.2 - 0.8 10*9/L    Absolute Eosinophils 0.0 0.0 - 0.4 10*9/L    Absolute Basophils 0.0 0.0 - 0.1 10*9/L    Large Unstained Cells 0 0 - 4 %    Microcytosis Marked (A) Not Present    Macrocytosis Moderate (A) Not Present Anisocytosis Marked (A) Not Present    Hypochromasia Slight (A) Not Present   Morphology Review    Collection Time: 08/28/19  4:56 AM   Result Value Ref Range    Smear Review Comments See Comment (A) Undefined    Target Cells Moderate (A) Not Present    Poikilocytosis Moderate (A) Not Present   POCT Glucose    Collection Time: 08/28/19 10:42 AM   Result Value Ref Range    Glucose, POC 160 70 - 179 mg/dL   POCT Glucose    Collection Time: 08/28/19  4:11 PM   Result Value Ref Range    Glucose, POC 317 (H) 70 - 179 mg/dL       Microbiology:    Culture results reviewed:  Culture results reviewed:  10/12 bcx central line: GPC in chains, strep mitis +  10/12 bcx peripheral: GPC in chains, strep mitis +, MSSA +  10/13 bcx peripheral 1: NG x 72 hrs  10/13 bcx peripheral 2: NG x 72 hrs  10/14 bcx central line: NG x 48 hrs  10/15 bcx peripheral: No growth at 48 hours  10/15 bcx central line: No growth at 48 hours    Imaging:     I personally spent 25 minutes on the floor or unit in direct patient care. The direct patient care time included face-to-face time with the patient, reviewing the patient's chart, communicating with the family and/or other professionals and coordinating care. Greater than 50% of this time was spent in counseling or coordinating care with the patient regarding Staff aureus and strep mitis bacteremia.

## 2019-08-29 NOTE — Unmapped (Signed)
Pt VSS and afebrile, on RA. No c/o pain or nausea. Pt received medications as scheduled. Nightly insulin given. PICC c/d/I infusing IVF at [facility] and abx. AML drawn. Pt drinking well, voiding well, no bm overnight.  Grandmother at bedside. Will ctm and follow POC.          Problem: Adult Inpatient Plan of Care  Goal: Plan of Care Review  Outcome: Progressing     Problem: Wound  Goal: Optimal Wound Healing  Outcome: Progressing     Problem: Self-Care Deficit  Goal: Improved Ability to Complete Activities of Daily Living  Outcome: Progressing

## 2019-08-29 NOTE — Unmapped (Addendum)
Team re-consulted VIR regarding liver biopsy per consultation not 08/22/2019.  Procedure was cancelled on 10/13 by primary team as patient with positive cultures for staph bacteremia.  Pt. Now treated with IV antibiotics and team interested in rescheduling diagnostic/therapeutic paracentesis and liver biopsy.   Team made aware, given ascites, it is likely we will have to proceed with transjugular liver biopsy approach as stated in consult note 08/22/2019. We will evaluate at time of procedure for percutaneous versus transjugular approach.  Prior consent was obtained and available on patient's chart.  No additional questions from patient at this time.  New order for paracentesis and biopsy placed.

## 2019-08-29 NOTE — Unmapped (Signed)
Received a page from primary team.  They no longer need diagnostic paracentesis.  We will proceed with liver biopsy tomorrow with general anesthesia.

## 2019-08-29 NOTE — Unmapped (Signed)
Follow-up Pediatric Palliative Care Consult Note:  ??  [name] Gross is a 18 y.o. male seen for pediatric palliative care consultation at the request of Dr. Curtis Sites for goals of care, advance care planning, and family support.  ??  Primary Care Provider: [name], MD  ??  History provided by: Patient  ??  Assessment:  ??  [name] Gross is a 18 y.o. 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 His liver function has not yet responded to high dose steroids and thymoglobulin, which is concerning for possible need for another liver transplant if he could be eligible, which providers have expressed is unlikely for psychosocial reasons. Jeffree is at risk for losing his ability to care for himself/make decisions for himself. He was encouraged to identify a surrogate decision maker (he does not wish for this to default to next of kin) during this time while he is at risk for further decompensation/further decline in health.  He has since identified a close friend and maternal grandfather.  Additionally, Bon is actively completing Voicing My Choices so that decision makers and family will know his wishes.   Palliative care consult requested for goals of care, advance care planning, and family support.     ??  Summary of Discussion and Recommendations:   ??  1. SYMPTOMS:   - [name] was in good spirits during our visit today.  He was observed smiling multiple times during our conversation.  He denied nausea or pain/discomfort.  His appetite has also remained good, reporting that he had eaten tacos and [place] food over the weekend.  His paternal grandmother was at bedside.  Kamaury seemed comfortable with her presence.  She acknowledged his autonomy and was understanding of him wanting/needing boundaries around his health care.  - Jaquil reported that he had been updated by his care team.  He was anticipating liver biopsy mid-week to see where things were.  ??  2. GOALS:   - [name] hopes to make a recovery, find housing, and find a job. He shared that he anticipates discharging to a shelter but notes is friends are encouraging him to move in with family - he believes this is because they are concerned about his safety.   - [name] has long term goals which include going back to school and becoming a Runner, broadcasting/film/video or nurse.   ??  3. DECISIONS:   - Given risk of hepatic encephalopathy, and general risk of his condition deteriorating, recommended Axil think about who he would want to be his surrogate decision maker if he was unable to make decisions for himself (see ACP below)   - [name] likes to receive information directly.   - Dr. Melrose Nakayama has reviewed with [name] that redo liver transplant can be an option for some people and inquired whether this is something Equan knows about. [name] acknowledged knowing that he may not be a candidate and that he doesn't know if he would choose to pursue another transplant. Note that he has been told by transplant SW that if he would like to be a candidate for liver transplant, he needs social supports including stable housing, transportation, source of income, et.    - [place] [facility] Decision Making Tool given and explained at a previous visit. He was encouraged to use this tool along with an ACP guide to document information he receives from providers.   ??  4. ADVANCE CARE PLANNING  - Reminded [name] of the Voicing My Choices booklet and offered to assist him with completing book if wanted/needed. Mujtaba worked on Voicing My Choices more over the weekend and would like to designate a Runner, broadcasting/film/video. See ACP note.   - Recommended Wyat think about who he would want to be his surrogate decision maker if he was unable to make decisions for himself. He has repeatedly shared that he would not trust his mother to make decisions for him. [name] has started this conversation with friends, [name] and [name], who he agrees would need more information about what that role may look like and not updated on [name]'s current clinical status   - Code status: Full code, not discussed today.  - Prognosis: Guarded given severity of liver disease and potential for lack of response to salvage therapies, AKI, and complex social situation with lack of caregiver support.  ??  5. SUPPORT:   - [name] describes limited personal supports including an aunt and maternal grandparents, and friends. He has not spoken to any family since being admitted to the hospital. He has reached out to two friends who have been good supports throughout his illness  - Discussed hospital based resources including chaplains.  - Wadsworth agreed to begin SSI application as suggested by transplant social worker, as the process could take months to complete.  He has since decided to wait until he was on a secure network instead of the an open/public network at the hospital due to security concerns.  He was also told how to access his medical records.  ??  6. CARE COORDINATION:   - Care coordinated with nursing.         Total time spent with patient for evaluation & management (excluding ACP documented separately): 15 minutes.  Greater than 50% of this time spent on counseling/coordination of care: Yes.  See ACP Note from today for additional billable service: Yes.     We appreciate the consult. The Children's Supportive Care Team can be reached by pager [name]) or email ([email or web address]).   ??  ??  History of Present Illness:  [name]??is a 18 y.o.??male??with history primary sclerosing cholangitis s/p liver transplant (2017) with multiple??epsiodes??of rejection, depression (history of suicide attempt via over dose in April 2019)??who??was admitted with reports of??black stools, abdominal pain, and icteric sclera??most likely due to acute liver transplant rejection/failure. His hospital course has been complicated by AKI, significant ascites. His liver function has not yet responded to pulse dose steroids, primary team concerned about overall prognosis and need for repeat liver transplant.   ??  Interval events: In good spirits today.  Grandmother at bedside and active in care.  Remains on IVABX.  Anticipating liver biopsy this week.  Compliant with medications/insulin.      Nivan was praised for the difficult conversations he has had and the difficult work with ACP he has done and continues to do.    Allergies:  Bee pollen and Pollen extracts  ??  Medications:  Scheduled Meds:  ??? ampicillin-sulbactam  3 g Intravenous Q6H   ??? benzoyl peroxide   Topical Nightly   ??? calcium carbonate  300 mg elem calcium Oral BID   ??? clindamycin  1 application Topical daily   ??? ergocalciferol  50,000 Units Oral Weekly   ??? flu vacc [id number] 6mos up(PF)  0.5 mL Intramuscular During hospitalization   ??? insulin glargine  15 Units Subcutaneous Nightly   ??? insulin lispro  0-5 Units  Subcutaneous ACHS   ??? insulin lispro  8 Units Subcutaneous TID AC   ??? ketoconazole   Topical daily   ??? magnesium sulfate  2 g Intravenous Once   ??? melatonin  6 mg Oral QPM   ??? multivitamin, with zinc  2 tablet Oral Daily   ??? mupirocin   Topical TID   ??? nystatin  1,000,000 Units Oral TID   ??? pantoprazole  40 mg Oral Daily   ??? phytonadione (AQUA-MEPHYTON) intravenous  7.5 mg Intravenous Daily   ??? potassium phosphate (monobasic)  500 mg Oral TID   ??? [START ON 08/30/2019] predniSONE  40 mg Oral Daily   ??? rifAXIMin  550 mg Oral BID   ??? sulfamethoxazole-trimethoprim  1 tablet Oral Q MWF   ??? valGANciclovir  450 mg Oral Daily     Continuous Infusions:  ??? sodium chloride 500 mL (08/26/19 0537)   ??? sodium chloride 10 mL/hr (08/27/19 2101)     PRN Meds:.dextrose 50 % in water (D50W), heparin, porcine (PF), insulin lispro, ondansetron, polyethylen glycol    Physical Exam:   Vitals:    08/29/19 1118   BP: 109/76   Pulse: 70 Resp: 24   Temp: 37 ??C (98.6 ??F)   SpO2: 100%   ??  Gen: thin young man sitting up in bed, NAD.  Skin: jaundiced.  HEENT: +scleral icterus.   Chest: normal work of breathing  Abd: protruberant  Psych: smiling, engaging.   ??  Data: Reviewed test results in Epic.    La Lindwood Qua, [place], CPNP  Children's Supportive Care Team  [phone] (pager)      Teaching Physician Attestation    I was the supervising physician in the delivery of the service. I was involved with making the recommendations contained in this note, and communicated these recommendations to the primary service.     Vladimir Creeks, MD  Attending Physician, Children's Supportive Care Team

## 2019-08-30 LAB — CREATININE, URINE: Lab: 29.6

## 2019-08-30 LAB — IMMUNOGLOBULIN SUBCLASS IGG4: IgG subclass 4:MCnc:Pt:Ser:Qn:: 25.6

## 2019-08-30 LAB — COMPREHENSIVE METABOLIC PANEL
ALBUMIN: 2.6 g/dL — ABNORMAL LOW (ref 3.5–5.0)
ALKALINE PHOSPHATASE: 849 U/L — ABNORMAL HIGH (ref 65–260)
AST (SGOT): 182 U/L — ABNORMAL HIGH (ref 19–55)
BILIRUBIN TOTAL: 33.9 mg/dL — ABNORMAL HIGH (ref 0.0–1.2)
BLOOD UREA NITROGEN: 13 mg/dL (ref 7–21)
BUN / CREAT RATIO: 20
CALCIUM: 7.7 mg/dL — ABNORMAL LOW (ref 8.5–10.2)
CREATININE: 0.66 mg/dL — ABNORMAL LOW (ref 0.70–1.30)
EGFR CKD-EPI AA MALE: >90 mL/min/{1.73_m2} (ref >=60)
EGFR CKD-EPI NON-AA MALE: >90 mL/min/{1.73_m2} (ref >=60)
GLUCOSE RANDOM: 103 mg/dL — ABNORMAL HIGH (ref 70–99)
POTASSIUM: 3.2 mmol/L — ABNORMAL LOW (ref 3.5–5.0)
SODIUM: 138 mmol/L (ref 135–145)

## 2019-08-30 LAB — MAGNESIUM: Magnesium:MCnc:Pt:Ser/Plas:Qn:: 1.7

## 2019-08-30 LAB — ALBUMIN: Albumin:MCnc:Pt:Ser/Plas:Qn:: 2.6 — ABNORMAL LOW

## 2019-08-30 LAB — GAMMA GLUTAMYL TRANSFERASE: Gamma glutamyl transferase:CCnc:Pt:Ser/Plas:Qn:: 537 — ABNORMAL HIGH

## 2019-08-30 LAB — BILIRUBIN DIRECT: Bilirubin.glucuronidated+Bilirubin.albumin bound:MCnc:Pt:Ser/Plas:Qn:: 30.8 — ABNORMAL HIGH

## 2019-08-30 LAB — PHOSPHORUS: Phosphate:MCnc:Pt:Ser/Plas:Qn:: 2.4 — ABNORMAL LOW

## 2019-08-30 NOTE — Unmapped (Signed)
Confidential Psychological Therapy Session  [facility]      Patient Name: [name] Gross  Medical Record Number: [id number]  Date of Service: August 29, 2019  Clinical Psychologist: Artemio Aly, PhD  Intern: None  Time Spent: 45 min of face-to-face counseling  CPT Procedure Code: 81191 (45 min psychotherapy with patient and/or family)  Therapy Type: Behavior Modifying/Cognitive Behavioral Therapy (CBT)  Purpose of Treatment: assessment of psychotherapy needs, adjustment to chronic medical illness, improve coping.    Referral/Relevant History:  Mr.  Gross is a very pleasant 18 y.o.  male who presents for cognitive behavioral therapy to address adjustment to chronic medical illness (and recent hospitalization for liver transplant rejection). He was seen by the adult inpatient psychiatry team on 08/23/19 for a history of MDD (and two previous suicide attempts, last attempt April 2019) and current depressive symptoms. During assessment with psychiatry, Mr. Tinoco declined pharmacotherapy but stated that he would be interested in receiving therapy services while inpatient to work through his difficult medical diagnoses and family situation.     Review of Symptoms/ROS: Deferred    Subjective: [name] Gross reported that he has struggled with depression for several years due to his medical conditions and a challenging family situation. He noted that he began seeing MH providers due to life changes related to his liver disease, as it was interfering with his ability to live the kind of life he wanted to as a young adult. In particular, he described the struggle between wanting to be independent as a young adult and needing assistance due to his liver disease. He described his mother as overbearing when he was sick, constantly checking in on how he was doing and getting upset with him if he tried to manage things on his own. This situation escalated to the point where his mother threatened to kick him out, so he left home and alternated between staying with friends and periods of homelessness. He is still angry with his mother for this, and has not talked to her since this time despite efforts from other family members that want them to reconcile.     Since being hospitalized about one month ago, [name] reports that he has been feeling depressed and isolated. He turned his phone off upon admission and did not want to talk to anyone, and remained this way for about three weeks; about a week ago, he decided to turn his phone back on and has reconnected with some friends and family which has improved his mood. He expressed that he has not been feeling as depressed in the past week due to increasing social contact and engaging in some enjoyable activities while in the hospital, like working on the [employer] book with [employer]. He endorsed some anxiety as well during this time, primarily about my whole situation regarding the liver rejection and waiting for results from liver biopsies. He reports motivation to engage in therapy, and stated that his goal is to be mentally ready to go back into the world after this hospitalization.     Objective:     Mental Status Exam:  Appearance: Malnourished and Jaundiced   Motor: No abnormal movements  Speech/Language: Normal rate, volume, tone, fluency  Mood: Depressed  Affect: Depressed  Thought Process: Logical, linear, clear, coherent, goal directed  Thought Content: Denies SI, HI, self harm, delusions, obsessions, paranoid ideation, or ideas of reference  Perceptual Disturbances: Denies auditory and visual hallucinations, behavior not concerning for response to internal  stimuli  Orientation: Oriented to person, place, time, and general circumstances  Attention: Able to fully attend without fluctuations in consciousness  Concentration: Able to fully concentrate and attend  Memory: Immediate, short-term, long-term, and recall grossly intact  Fund of Knowledge: Consistent with level of education and development  Insight: Fair  Judgment: Fair  Impulse Control: Fair    Assessment:  Mr.  Gross participated well in this CBT session and exhibits good motivation towards engaging in treatment.     Mr.  Gross is reporting symptoms of both depression and anxiety, and appears to meet criteria for MDD at this time. While he described improvements in mood within the past week, he is still struggling to cope with his chronic medical conditions and described feeling trapped in his current situation. It is likely that continuing to promote ways to help Mr. Hopping remain engaged and socially connected will be helpful during his hospitalization. Additionally, exploring his values in life and finding ways to help him maintain some independence will likely be very beneficial. Improvements in depression and anxiety symptoms can contribute to improved quality of life, functionality, and ability to cope with chronic illness.     Focus on current treatment is reduction of depressive symptoms, increasing behavioral activation, adjustment to chronic medical conditions.     Focus on future sessions will include values exploration, behavioral activation, learning cognitive coping strategies, relaxation strategies.    Adherence concerns: While there were concerns about non-adherence to anti-rejection medications prior to this hospitalization, no current adherence concerns given inpatient status.    Diagnostic Impression: Major Depressive Disorder, Recurrent Episode, Moderate Severity    Risk Assessment:  A suicide and violence risk assessment was performed as part of this evaluation. The patient is deemed to be at chronic elevated risk for self-harm/suicide given the following factors: male age 27-35, current diagnosis of depression, previous acts of self-harm and chronic severe medical condition. The patient is deemed to be at chronic elevated risk for violence given the following factors: male gender and younger age. These risk factors are mitigated by the following factors:lack of active SI/HI, no know access to weapons or firearms, no history of violence, motivation for treatment, utilization of positive coping skills, supportive family, enjoyment of leisure actvities and expresses purpose for living. There is no acute risk for suicide or violence at this time. The patient was educated about relevant modifiable risk factors including following recommendations for treatment of psychiatric illness and abstaining from substance abuse.    While future psychiatric events cannot be accurately predicted, the patient does not currently require  acute inpatient psychiatric care and does not currently meet [facility] involuntary commitment criteria.        Psychometric Testing: None administered today. Consider administration of PHQ-9 and/or GAD-7 in future sessions.      Plan:  Mr.  Dross will continue meeting with me for CBT, and was given instructions for contacting this provider if needed.     Mr.  Halbert will be seen approximately once/week, next appointment on or around 09/05/19.      Mr.  Borthwick was given this writer's contact information with confidential voice mail number and instructed to call [phone] for emergencies.

## 2019-08-30 NOTE — Unmapped (Signed)
Pt afebrile and VSS this shift. Pt did not complain of any pain today. Central line site and dressing clean, dry, and intact. Fluids infusing. Pt remained NPO until 1500 in preparation for going to [facility] for a liver biopsy. Pt received insulin as ordered, and had blood glucose checks ACHS. Pt voiding well and had a BM today. Pt called out and asked for RN or NA to help him come wipe after having a BM. NA who took care of him previously this week stated that he asked for her to wipe him in a very particular way. RN asked him if he is having any pain or trouble with wiping himself, and he said he is having range of motion issues and he can't clean him self the way he wants to when I wipe myself. RN noted that the pt is mobile and moving extremities without difficulty during the day and notified MD of this. Pt resting in bed, no family at bedside. Will continue to monitor pt and follow POC.

## 2019-08-30 NOTE — Unmapped (Signed)
Pediatric Daily Progress Note     Assessment/Plan:     Principal Problem:    Liver transplant rejection (CMS-HCC)  Active Problems:    Recurrent major depressive disorder, in partial remission (CMS-HCC)    Malnutrition of mild degree (CMS-HCC)    History of liver transplant (CMS-HCC)    Transaminitis    Normocytic anemia    Hypoalbuminemia    Steroid-induced hyperglycemia    Pityrosporum folliculitis    Abdominal pain    Homeless    Thrombocytopenia (CMS-HCC)    Hypomagnesemia    Hypophosphatasia    Other ascites    MSSA bacteremia    Hyperbilirubinemia    Impetigo lesions of Right Ear and Posterior Neck    Steroid-induced acne    Liver dysfunction secondary to transplant rejection     Irritant contact dermatitis of the hands    Sleep difficulties    Strep Mitis Central line-associated bloodstream infection  Resolved Problems:    Cholangitis of transplanted liver (CMS-HCC)    Melena    Dark stools    [name] Gross a 18 y.o.??male??with history of??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 Along with steroids, tacrolimus, and cellcept, thyroglobulin treatment was initiated (on 10/01-10/12) but yielded limited benefit in liver studies to date. Patient has discussed prognosis with Dr. Melrose Nakayama and palliative care including the possibility of not recovering from liver rejection (10/9). Patient understands that transplant is an option but that he may not be a candidate. His main active issue currently is treating CLABSI secondary to strep mitis and MSSA (found after cultures obtained secondary to leukocytosis on daily labs) with Unasyn for a total of a 14 day course (through 10/27) and salvaging his PICC line for now given difficulty with access. Due to his infection all of his immunosuppressants (thymoblobulin, Cellcept, tacrolimus), were held, his steroids were weaned, and his repeat liver biopsy were delayed until his bloodstream infection was adequately managed.  On 10/19, his immunosuppressants were restarted, and biopsy was rescheduled. His other active issues are complications secondary to liver dysfunction (hypoalbuminemia, ascites, edema, abdominal pain, elevated INR, electrolyte abnormalities). Currently his ascites/edema is stable; we will continue to monitor closely and consider albumin/lasix as needed. He also has steroid-induced hyperglycemia, currently being managed on insulin with daily adjustments.     #Hx Liver Transplant - Subacute/Acute Liver Failure: ??Biopsy confirmed severe acute rejection (9/25), RAI 9/9. Liver labs without a significant improvement after immunosuppression. Labs today with Dbili 30.80 (29.40), GGT 537 (500).   - tranplant team recommended repeat liver biopsy, but delaying given CLABSI infection. Now rescheduled for tomorrow, 10/21   - Would prefer plts >50 and INR >1.5 prior to procedure   - NPO at MN  - S/p 12 days of ATG and high dose MethylPrednisolone (10/1-10/12) with poor overall repsonse to therapy   - Restarted Tacro and Mycophenolate and ATG on 10/20    - Continue prednisone 40mg  today  - Continue Valgancyclovir 450mg  daily (3months), Bactrim MWF (34mo) for prophylaxis (10/2-) and Nystatin 10ml TID (47mo) prophylaxis (10/2-)  - vitamin K IV daily without change. Can discuss switching to PO with Dr. Melrose Nakayama   - Protonix 40 mg PO daily  - Continue Rifaximin (given elevated ammonia, no encephalopathy)   - PT/INR on M/Th     # Acute Diarrhea: Patient endorsed 3x episodes of diarrhea on 10/20 overnight.  Given patient is  immunocompromised will complete a thorough work-up.  - C diff, GIPP, stool ova and parasite    #CLABSI: Strep mitis and MSSA   Developed sudden leukocytosis with white count elevated to 17.3 10/12, afebrile, non-toxic appearing, without localizing symptoms. Found to have positive Bcx 10/12 growing strep mitis and MSSA. Given ascites and risk for SBP secondary to strep mitis, also covering for anaerobes. Echo negative for endocarditis   - CBCd M/Th  - 10/12 peripheral/PICC Cx positive for Strep mitis   - 1/012 peripheral - +MSAA  - f/u Bcx:   - 10/12 - NGTD   - 10/13 - NGTD   - 10/14 - NGTD  - Per ID recs:   - Unasyn x 14 days total (10/13-10/27)   - Given unreliable PIV access, will attempt to salvage his PICC while treating his infection   - Draw new bcx if any repeat cultures turn positive     #Edema, ascites, abdominal pain secondary to liver dysfunction  Ascites still present, stable.   - Holding IVFs, taking good PO  - Following daily weights  - STRICT I/Os      #hypoalbuminemia  - likely contributing to ascites/edema  - responded to albumin/lasix spot doing, will consider for worsening edema    #Nutriton: severe protein-calorie malnutrition. Prior to admission, patient has been skipping meals due to poor appetite and food insecurity.  - Continue goal PO fluid intake of 1.6L   - Regular diet +  Ensure supplements   - Ergocalciferol qMon   - aquadex multivitamin    - Ca 300 mg BID    #Electrolyte abnormalities: hypomagnesemia, hypophosphatemia: Mg 1.7 (1.5), 2.4 (2.1)  - Daily CMP, Mg, Phos  - Urine labs--K 9.1, Phos 23.2 , Mag 3.1,Cr 29.6   - F/u PTH, PTHrP, Vit D  - Hypomagnesemia   - mag sulphate IV prn (10/15, 10/17)  - Hypophosphatemia    - k-phos daily (10/16-), increased to TID on 10/19     #constipation  - Miralax 17 g BID PRN    #Hyperglycemia   -insulin/accucheck compliance has been an issue this week, with better compliance over the past 24 hours, and BG levels have been improving  -New Insulin regiment given NPO   - Lantus 6U nightly (1/2 dose if NPO)   -??Lispro?? 6 units TIDAC with meals only when eating   -??PRN Lispro dose for overnight meal: if a normal sized meal give 4 units, if smaller give 2 units lispro   - Continue sensitive SSI QACHS  -Endocrine following, appreciate    #Thrombocytopenia  - s/p plt transfusion on 10/13, 10/15 for PLT of 50k     #Impetigo Skin Lesions on Right Ear and Left posterior Neck, + MSSA, healing well   - Derm consulted. Appreciate recs   - Mupirocin ointment for both lesions until healed, per derm    #steroid-induced acne, possible fungal folliculitis   - topical clindamycin, benzoyl peroxide, Ketoconazole shampoo    ??  #Major Depressive Disorder, insomnia: Stable. Has hx of attempted overdoses, with the last attempt in April 2019??at which point he was hospitalized in the Adolescent Psychiatry unit and discharged on Lexapro 20 mg. ??He has not engaged with mental health resources or taken medication for depression or insomnia in 8-10 months.????Denies SI, passive suicidality, self-harm, or HI at this time.    - Patient saw adult psychiatry team again on 10/13. Offered SSRI, pt still considering   - will only see adult psychiatry   -  melatonin 6 mg 1 hour before bedtime    #Deconditioning. Patient endorses feeling weak and having limited mobility.   -PT following  -Encourage OOB and ambulation    #Homelessness - Social Concerns:??Azeem has experienced homelessness for ~5 months when he left his mother's house. ??He has severed all ties with his family and states his relationship with his mother cannot be reconciled. ??He also requested contact information of family members to be erased from his chart (this has been completed, but social worker did drop contact info into her note on 9/24) and he more recently has reconciled with some family members. ??In addition to food insecurity, he has not been able to secure medications. ??He is routinely exposed to alcohol and drugs (marijuana, cocaine, etc.) second-hand through his friendships though he denies current use himself. ??On urine drug screen, he tested positive for marijuana only.  -Supportive care following.  -Designated a personal friend [name]) and grandfather to be his HCPOAs (Friend is the Primary).   -Spoke with sister on 10/16 (has not been in regular contact)   -Paternal grandmother now visiting regularly   ??  Access: PICC (placed 9/28), will need removal if he becomes unstable in the setting of CLABSI infection   ??  Discharge criteria:??liver transplant work-up and treatment of acute rejection    Subjective:  patient States he had 3x episodes of diarrhea overnight which is new. Denies any blood in diarrhea. Otherwise has no complaints and just anxious to get liver biopsy done.     Objective:     Vital signs in last 24 hours:  Temp:  [36.7 ??C-37 ??C] 37 ??C  Heart Rate:  [64-87] 84  Resp:  [20-30] 20  BP: (98-120)/(57-86) 117/86  MAP (mmHg):  [74-96] 96  SpO2:  [100 %] 100 %  Intake/Output last 3 shifts:  I/O last 3 completed shifts:  In: 941 [P.O.:300; I.V.:641]  Out: 2050 [Urine:2050]    Physical Exam:   General:  Sitting up in bed, awake. Thin appearing. Interactive and conversational   Head: normocephalic, atraumatic.  Eyes: EOMI. Marked scleral icterus present.   Nose:   clear, no discharge  Lungs: clear breath sounds bilaterally. No increased work of breathing. No wheezes/crackles. Good aeration at the bases.   Heart: regular rate and rhythm. No murmurs appreciated on auscultation.  Abdomen:  Abdomen, soft, mild TTP over the right side. Ascites present. No HSM.   Neuro: deconditioned, moves all extremities with decreased strength.   Extremities: non-pitting edema in the LE, WWP  Skin: Skin lesions on left neck, healing well. Skin lesion present on right ear with scabbed. Acneform rash present on face, back, chest.     Active Medications reviewed and KEY Medications include:     Current Facility-Administered Medications:   ???  ampicillin-sulbactam dilution (UNASYN) injection 3 g, 3 g, Intravenous, Q6H, Darden Dates Gutierrez-Wu, MD, 3 g at 08/30/19 1401  ???  benzoyl peroxide 5 % gel, , Topical, Nightly, Alfredia Ferguson, MD  ???  calcium carbonate (OS-CAL) tablet 300 mg elem calcium, 300 mg elem calcium, Oral, BID, David Stall, MD, 300 mg elem calcium at 08/30/19 0930  ???  clindamycin (CLEOCIN T) 1 % lotion 1 application, 1 application, Topical, daily, Alfredia Ferguson, MD, 1 application at 08/29/19 1649  ???  dextrose 50 % in water (D50W) 50 % solution 12.5 g, 12.5 g, Intravenous, Q10 Min PRN, David Stall, MD  ???  ergocalciferol (DRISDOL) capsule 50,000 Units, 50,000 Units, Oral, Weekly, Victorino Dike  Calton Dach, MD, 50,000 Units at 08/29/19 1610  ???  heparin preservative-free injection 10 units/mL syringe (HEPARIN LOCK FLUSH), 20 Units, Intravenous, Daily PRN, David Stall, MD, 20 Units at 08/27/19 1524  ???  influenza vaccine quad (FLUARIX, FLULAVAL, FLUZONE) (6 MOS & UP) 2020-21, 0.5 mL, Intramuscular, During hospitalization, David Stall, MD  ???  insulin glargine (LANTUS) injection 6 Units, 6 Units, Subcutaneous, Nightly, Val Eagle, 6 Units at 08/29/19 2107  ???  insulin lispro (HumaLOG) injection 0-5 Units, 0-5 Units, Subcutaneous, ACHS, Jonna Clark, MD, Stopped at 08/30/19 1630  ???  insulin lispro (HumaLOG) injection 2-4 Units, 2-4 Units, Subcutaneous, Nightly PRN, David Stall, MD, 2 Units at 08/29/19 0103  ???  insulin lispro (HumaLOG) injection 6 Units, 6 Units, Subcutaneous, TID AC, Alfredia Ferguson, MD, 6 Units at 08/30/19 1450  ???  ketoconazole (NIZORAL) 2 % shampoo, , Topical, daily, Alfredia Ferguson, MD  ???  melatonin tablet 6 mg, 6 mg, Oral, QPM, David Stall, MD, 6 mg at 08/29/19 2058  ???  multivitamin, with zinc (AQUADEKS) chewable tablet, 2 tablet, Oral, Daily, David Stall, MD, 2 tablet at 08/30/19 9604  ???  mupirocin (BACTROBAN) 2 % ointment, , Topical, TID, David Stall, MD  ???  mycophenolate (MYFORTIC) EC tablet 540 mg, 540 mg, Oral, BID, David Stall, MD, 540 mg at 08/30/19 5409  ???  nystatin (MYCOSTATIN) oral suspension, 1,000,000 Units, Oral, TID, David Stall, MD, 1,000,000 Units at 08/29/19 2057  ???  ondansetron (ZOFRAN-ODT) disintegrating tablet 4 mg, 4 mg, Oral, Q8H PRN, David Stall, MD  ???  pantoprazole (PROTONIX) EC tablet 40 mg, 40 mg, Oral, Daily, David Stall, MD, 40 mg at 08/29/19 2118  ???  phytonadione (vitamin K1) (AQUA-MEPHYTON) 7.5 mg in sodium chloride (NS) 0.9 % 50 mL IVPB, 7.5 mg, Intravenous, Daily, Lewie Loron, MD, Last Rate: 173.3 mL/hr at 08/30/19 0807, 7.5 mg at 08/30/19 8119  ???  polyethylene glycol (MIRALAX) packet 17 g, 17 g, Oral, BID PRN, David Stall, MD  ???  potassium phosphate (monobasic) (K-PHOS) tablet 500 mg, 500 mg, Oral, TID, David Stall, MD, 500 mg at 08/30/19 1348  ???  predniSONE (DELTASONE) tablet 40 mg, 40 mg, Oral, Daily, David Stall, MD, 40 mg at 08/30/19 0805  ???  rifAXIMin (XIFAXAN) tablet 550 mg, 550 mg, Oral, BID, David Stall, MD, 550 mg at 08/30/19 1478  ???  sodium chloride (NS) 0.9 % infusion, , Intravenous, Continuous, David Stall, MD, Last Rate: 10 mL/hr at 08/26/19 0537, 1,000 mL at 08/27/19 0731  ???  sodium chloride (NS) 0.9 % infusion, 10 mL/hr, Intravenous, Continuous, Ejiofor A Lucky Cowboy., MD, Last Rate: 10 mL/hr at 08/27/19 2101, 10 mL/hr at 08/27/19 2101  ???  sulfamethoxazole-trimethoprim (BACTRIM) 400-80 mg tablet 80 mg of trimethoprim, 1 tablet, Oral, Q MWF, David Stall, MD, 80 mg of trimethoprim at 08/29/19 0837  ???  tacrolimus (PROGRAF) capsule 3 mg, 3 mg, Oral, BID, David Stall, MD, 3 mg at 08/30/19 0805  ???  valGANciclovir (VALCYTE) tablet 450 mg, 450 mg, Oral, Daily, David Stall, MD, 450 mg at 08/29/19 1648        Studies: Personally reviewed and interpreted.  Labs/Studies:  Labs and Studies from the last 24hrs per EMR and Reviewed and     Lab Results   Component Value Date    WBC 4.8 08/29/2019    HGB 9.8 (L) 08/29/2019  HCT 28.6 (L) 08/29/2019    PLT 59 (L) 08/29/2019       Lab Results   Component Value Date NA 138 08/30/2019    K 3.2 (L) 08/30/2019    CL 104 08/30/2019    CO2  08/30/2019      Comment:      Specimen icteric.      BUN 13 08/30/2019    CREATININE 0.66 (L) 08/30/2019    GLU 103 (H) 08/30/2019    CALCIUM 7.7 (L) 08/30/2019    MG 1.7 08/30/2019    PHOS 2.4 (L) 08/30/2019     Lab Results   Component Value Date    BILITOT 33.9 (H) 08/30/2019    BILIDIR 30.80 (H) 08/30/2019    PROT  08/30/2019      Comment:      Specimen icteric.      ALBUMIN 2.6 (L) 08/30/2019    ALT  08/30/2019      Comment:      Specimen icteric.      AST 182 (H) 08/30/2019    ALKPHOS 849 (H) 08/30/2019    GGT 537 (H) 08/30/2019     Lab Results   Component Value Date    PT 18.0 (H) 08/29/2019    INR 1.55 08/29/2019    APTT 25.0 (L) 08/14/2019     ========================================  Jonna Clark, MD  Anesthesiology - PGY-1  Pager: [phone]    I supervised the resident physician who spent 35 minutes on the floor or unit in direct patient care. The direct patient care time included face-to-face time with the patient, reviewing the patient's chart, communicating with the family and/or other professionals and coordinating care. Greater than 50% of this time was spent in counseling or coordinating care with the patient regarding diagnosis/diagnostic considerations, planned testing, relevant test results, and treatment recommendations. I was available throughout care provided  [employer]

## 2019-08-30 NOTE — Unmapped (Signed)
Afeb, VSS. No complaints of pain. No nausea or vomiting. No PRNs needed. Pt voiding well, had several loose bowel movements. IVF infusing through PICC w/ no issues. Grandmother at bedside for beginning of shift, appropriate. Aware of plan of care.      Problem: Adult Inpatient Plan of Care  Goal: Plan of Care Review  Outcome: Ongoing - Unchanged  Goal: Patient-Specific Goal (Individualization)  Outcome: Ongoing - Unchanged  Goal: Absence of Hospital-Acquired Illness or Injury  Outcome: Ongoing - Unchanged  Goal: Optimal Comfort and Wellbeing  Outcome: Ongoing - Unchanged  Goal: Readiness for Transition of Care  Outcome: Ongoing - Unchanged  Goal: Rounds/Family Conference  Outcome: Ongoing - Unchanged     Problem: Wound  Goal: Optimal Wound Healing  Outcome: Ongoing - Unchanged     Problem: Fall Injury Risk  Goal: Absence of Fall and Fall-Related Injury  Outcome: Ongoing - Unchanged     Problem: Self-Care Deficit  Goal: Improved Ability to Complete Activities of Daily Living  Outcome: Ongoing - Unchanged

## 2019-08-30 NOTE — Unmapped (Signed)
Pediatric Daily Progress Note     Assessment/Plan:     Principal Problem:    Liver transplant rejection (CMS-HCC)  Active Problems:    Recurrent major depressive disorder, in partial remission (CMS-HCC)    Malnutrition of mild degree (CMS-HCC)    History of liver transplant (CMS-HCC)    Transaminitis    Normocytic anemia    Hypoalbuminemia    Steroid-induced hyperglycemia    Pityrosporum folliculitis    Abdominal pain    Homeless    Thrombocytopenia (CMS-HCC)    Hypomagnesemia    Hypophosphatasia    Other ascites    MSSA bacteremia    Hyperbilirubinemia    Impetigo lesions of Right Ear and Posterior Neck    Steroid-induced acne    Liver dysfunction secondary to transplant rejection     Irritant contact dermatitis of the hands    Sleep difficulties    Strep Mitis Central line-associated bloodstream infection  Resolved Problems:    Cholangitis of transplanted liver (CMS-HCC)    Melena    Dark stools    [name] Grossis a 18 y.o.??male??with history of??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 Along with steroids, tacrolimus, and cellcept, thyroglobulin treatment was initiated (on 10/01-10/12) but yielded limited benefit in liver studies to date. Patient has discussed prognosis with Dr. Melrose Nakayama and palliative care including the possibility of not recovering from liver rejection (10/9). Patient understands that transplant is an option but that he may not be a candidate. His main active issue currently is treating CLABSI secondary to strep mitis and MSSA (found after cultures obtained secondary to leukocytosis on daily labs) with Unasyn for a total of a 14 day course (through 10/27) and salvaging his PICC line for now given difficulty with access. Due to his infection all of his immunosuppressants (thymoblobulin, Cellcept, tacrolimus), were held, his steroids were weaned, and his repeat liver biopsy were delayed until his bloodstream infection was adequately managed.  On 10/19, his immunosuppressants were restarted, and biopsy was rescheduled. His other active issues are complications secondary to liver dysfunction (hypoalbuminemia, ascites, edema, abdominal pain, elevated INR, electrolyte abnormalities). Currently his ascites/edema is stable; we will continue to monitor closely and consider albumin/lasix as needed. He also has steroid-induced hyperglycemia, currently being managed on insulin with daily adjustments.     #Hx Liver Transplant - Subacute/Acute Liver Failure: ??Biopsy confirmed severe acute rejection (9/25), RAI 9/9. Liver labs without a significant improvement after immunosuppression.   - tranplant team recommended repeat liver biopsy, but delaying given CLABSI infection. Now rescheduled for 10/20   - Would prefer plts >50 and INR >1.5 prior to procedure   - NPO at MN with mIFV   - S/p 12 days of ATG and high dose MethylPrednisolone (10/1-10/12) with poor overall repsonse to therapy   - Restarted Tacro and Mycophenolate and ATG on 10/20    - Continue prednisone 60mg  today. Will increase to treatment dose tomorrow    - Continue Valgancyclovir 450mg  daily (3months), Bactrim MWF (46mo) for prophylaxis (10/2-) and Nystatin 10ml TID (71mo) prophylaxis (10/2-)  - vitamin K IV daily without change. Can discuss switching to PO with Dr. Melrose Nakayama   - Protonix 40 mg PO daily  - Continue Rifaximin (given elevated ammonia, no encephalopathy)   - PT/INR on M/Th     #CLABSI: Strep mitis and MSSA   Developed sudden leukocytosis with white count elevated to  17.3 10/12, afebrile, non-toxic appearing, without localizing symptoms. Found to have positive Bcx 10/12 growing strep mitis and MSSA. Given ascites and risk for SBP secondary to strep mitis, also covering for anaerobes. Echo negative for endocarditis   - CBCd M/Th  - 10/12 peripheral/PICC Cx positive for Strep mitis   - 1/012 peripheral - +MSAA  - f/u Bcx:   - 10/12 - NGTD   - 10/13 - NGTD   - 10/14 - NGTD  - Per ID recs:   - Unasyn x 14 days total (10/13-10/27)   - Given unreliable PIV access, will attempt to salvage his PICC while treating his infection   - Draw new bcx if any repeat cultures turn positive     #Edema, ascites, abdominal pain secondary to liver dysfunction  Ascites still present, stable.   - Holding IVFs, taking good PO  - Following daily weights  - STRICT I/Os      #hypoalbuminemia  - likely contributing to ascites/edema  - responded to albumin/lasix spot doing, will consider for worsening edema    #Nutriton: severe protein-calorie malnutrition. Prior to admission, patient has been skipping meals due to poor appetite and food insecurity.  - Continue goal PO fluid intake of 1.6L   - Regular diet +  Ensure supplements   - Ergocalciferol qMon   - aquadex multivitamin    - Ca 300 mg BID    #Electrolyte abnormalities: hypomagnesemia, hypophosphatemia, hyponatremia (corrected to 137 with hyperglycemia),   - Daily CMP, Mg, Phos  - f/u 10/19 urine labs--K, Phos, Mag, Glucose   - Hypomagnesemia   - mag sulphate IV prn (10/15, 10/17). Gave 2g mag sulphate today   - Hypophosphatemia    - k-phos daily (10/16-), increased to TID on 10/19 (Serum phos 2.7-> 2.3-> 2.1 today)   - f/u 10/19 vitamin D level     #constipation  - Miralax 17 g BID PRN    #Hyperglycemia   -insulin/accucheck compliance has been an issue this week, with better compliance over the past 24 hours, and BG levels have been improving  -New Insulin regiment per endocrine recs (10/18)   - Lantus 12U nightly (1/2 dose if NPO)   -??Lispro?? 6 units TIDAC with meals   -??PRN Lispro dose for overnight meal: if a normal sized meal give 4 units, if smaller give 2 units lispro   - Continue sensitive SSI QACHS    #Thrombocytopenia  - s/p plt transfusion on 10/13, 10/15 for PLT of 50k     #Impetigo Skin Lesions on Right Ear and Left posterior Neck, + MSSA, healing well   - Derm consulted. Appreciate recs   - Mupirocin ointment for both lesions until healed, per derm    #steroid-induced acne, possible fungal folliculitis   - topical clindamycin, benzoyl peroxide, Ketoconazole shampoo    ??  #Major Depressive Disorder, insomnia: Stable. Has hx of attempted overdoses, with the last attempt in April 2019??at which point he was hospitalized in the Adolescent Psychiatry unit and discharged on Lexapro 20 mg. ??He has not engaged with mental health resources or taken medication for depression or insomnia in 8-10 months.????Denies SI, passive suicidality, self-harm, or HI at this time.    - Patient saw adult psychiatry team again on 10/13. Offered SSRI, pt still considering   - will only see adult psychiatry   - melatonin 6 mg 1 hour before bedtime    #Deconditioning. Patient endorses feeling weak and having limited mobility.   -PT following  -Encourage  OOB and ambulation    #Homelessness - Social Concerns:??Lannis has experienced homelessness for ~5 months when he left his mother's house. ??He has severed all ties with his family and states his relationship with his mother cannot be reconciled. ??He also requested contact information of family members to be erased from his chart (this has been completed, but social worker did drop contact info into her note on 9/24) and he more recently has reconciled with some family members. ??In addition to food insecurity, he has not been able to secure medications. ??He is routinely exposed to alcohol and drugs (marijuana, cocaine, etc.) second-hand through his friendships though he denies current use himself. ??On urine drug screen, he tested positive for marijuana only.  -Supportive care following.  -Designated a personal friend [name]) and grandfather to be his HCPOAs (Friend is the Primary).   -Spoke with sister on 10/16 (has not been in regular contact)   -Paternal grandmother now visiting regularly Access: PICC (placed 9/28), will need removal if he becomes unstable in the setting of CLABSI infection   ??  Discharge criteria:??liver transplant work-up and treatment of acute rejection    Subjective:     Interval History:   Patient lying flat in bed. In excellent mood this am, thankful for epsom salt bath. Continues to eat will and has been more compliant with insulin. He has also been more active, OOB and into chair.     Objective:     Vital signs in last 24 hours:  Temp:  [36.5 ??C-37 ??C] 36.8 ??C  Heart Rate:  [66-85] 76  Resp:  [18-28] 24  BP: (95-116)/(57-76) 95/57  MAP (mmHg):  [83-87] 87  SpO2:  [100 %] 100 %  Intake/Output last 3 shifts:  I/O last 3 completed shifts:  In: 1296.4 [P.O.:240; I.V.:1056.4]  Out: 2701 [Urine:2700; Stool:1]    Physical Exam:   General:  Sitting up in bed, awake. Thin appearing. Interactive and conversational   Head: normocephalic, atraumatic.  Eyes: EOMI. Marked scleral icterus present.   Nose:   clear, no discharge  Lungs: clear breath sounds bilaterally. No increased work of breathing. No wheezes/crackles. Good aeration at the bases.   Heart: regular rate and rhythm. No murmurs appreciated on auscultation.  Abdomen:  Abdomen, soft, mild TTP over the right side. Ascites present. No HSM.   Neuro: deconditioned, moves all extremities with decreased strength.   Extremities: non-pitting edema in the LE, WWP  Skin: Skin lesions on left neck, healing well. Skin lesion present on right ear with scabbed. Acneform rash present on face, back, chest.     Active Medications reviewed and KEY Medications include:     Current Facility-Administered Medications:   ???  ampicillin-sulbactam dilution (UNASYN) injection 3 g, 3 g, Intravenous, Q6H, David Stall, MD, 3 g at 08/29/19 1533  ???  benzoyl peroxide 5 % gel, , Topical, Nightly, Alfredia Ferguson, MD, 1 application at 08/28/19 2101  ???  calcium carbonate (OS-CAL) tablet 300 mg elem calcium, 300 mg elem calcium, Oral, BID, David Stall, MD, 300 mg elem calcium at 08/29/19 0948  ???  clindamycin (CLEOCIN T) 1 % lotion 1 application, 1 application, Topical, daily, Alfredia Ferguson, MD, 1 application at 08/29/19 1649  ???  dextrose 50 % in water (D50W) 50 % solution 12.5 g, 12.5 g, Intravenous, Q10 Min PRN, David Stall, MD  ???  ergocalciferol (DRISDOL) capsule 50,000 Units, 50,000 Units, Oral, Weekly, David Stall, MD, 50,000 Units at 08/29/19 [phone]  ???  heparin preservative-free injection 10 units/mL syringe (HEPARIN LOCK FLUSH), 20 Units, Intravenous, Daily PRN, David Stall, MD, 20 Units at 08/27/19 1524  ???  influenza vaccine quad (FLUARIX, FLULAVAL, FLUZONE) (6 MOS & UP) 2020-21, 0.5 mL, Intramuscular, During hospitalization, David Stall, MD  ???  insulin glargine (LANTUS) injection 6 Units, 6 Units, Subcutaneous, Nightly, Val Eagle  ???  insulin lispro (HumaLOG) injection 0-5 Units, 0-5 Units, Subcutaneous, ACHS, David Stall, MD, 1 Units at 08/29/19 1859  ???  insulin lispro (HumaLOG) injection 2-4 Units, 2-4 Units, Subcutaneous, Nightly PRN, David Stall, MD, 2 Units at 08/29/19 0103  ???  insulin lispro (HumaLOG) injection 6 Units, 6 Units, Subcutaneous, TID AC, Val Eagle, 6 Units at 08/29/19 1900  ???  ketoconazole (NIZORAL) 2 % shampoo, , Topical, daily, Alfredia Ferguson, MD  ???  melatonin tablet 6 mg, 6 mg, Oral, QPM, David Stall, MD, 6 mg at 08/28/19 2100  ???  multivitamin, with zinc (AQUADEKS) chewable tablet, 2 tablet, Oral, Daily, David Stall, MD, 2 tablet at 08/29/19 2130  ???  mupirocin (BACTROBAN) 2 % ointment, , Topical, TID, David Stall, MD  ???  mycophenolate (MYFORTIC) EC tablet 540 mg, 540 mg, Oral, BID, David Stall, MD  ???  nystatin (MYCOSTATIN) oral suspension, 1,000,000 Units, Oral, TID, David Stall, MD, 1,000,000 Units at 08/29/19 1334  ???  ondansetron (ZOFRAN-ODT) disintegrating tablet 4 mg, 4 mg, Oral, Q8H PRN, David Stall, MD  ???  pantoprazole (PROTONIX) EC tablet 40 mg, 40 mg, Oral, Daily, David Stall, MD, 40 mg at 08/28/19 2100  ???  phytonadione (vitamin K1) (AQUA-MEPHYTON) 7.5 mg in sodium chloride (NS) 0.9 % 50 mL IVPB, 7.5 mg, Intravenous, Daily, Lewie Loron, MD, Last Rate: 173.3 mL/hr at 08/29/19 1000, 7.5 mg at 08/29/19 1000  ???  polyethylene glycol (MIRALAX) packet 17 g, 17 g, Oral, BID PRN, David Stall, MD  ???  potassium phosphate (monobasic) (K-PHOS) tablet 500 mg, 500 mg, Oral, TID, David Stall, MD, 500 mg at 08/29/19 1145  ???  [START ON 08/30/2019] predniSONE (DELTASONE) tablet 40 mg, 40 mg, Oral, Daily, David Stall, MD  ???  rifAXIMin Burman Blacksmith) tablet 550 mg, 550 mg, Oral, BID, David Stall, MD, 550 mg at 08/29/19 8657  ???  sodium chloride (NS) 0.9 % infusion, , Intravenous, Continuous, David Stall, MD, Last Rate: 10 mL/hr at 08/26/19 0537, 1,000 mL at 08/27/19 0731  ???  sodium chloride (NS) 0.9 % infusion, 10 mL/hr, Intravenous, Continuous, Ejiofor A Lucky Cowboy., MD, Last Rate: 10 mL/hr at 08/27/19 2101, 10 mL/hr at 08/27/19 2101  ???  sulfamethoxazole-trimethoprim (BACTRIM) 400-80 mg tablet 80 mg of trimethoprim, 1 tablet, Oral, Q MWF, David Stall, MD, 80 mg of trimethoprim at 08/29/19 0837  ???  tacrolimus (PROGRAF) capsule 3 mg, 3 mg, Oral, BID, David Stall, MD  ???  valGANciclovir (VALCYTE) tablet 450 mg, 450 mg, Oral, Daily, David Stall, MD, 450 mg at 08/29/19 1648        Studies: Personally reviewed and interpreted.  Labs/Studies:  Labs and Studies from the last 24hrs per EMR and Reviewed and     Lab Results   Component Value Date    WBC 4.8 08/29/2019    HGB 9.8 (L) 08/29/2019    HCT 28.6 (L) 08/29/2019    PLT 59 (L) 08/29/2019       Lab Results   Component Value Date    NA  136 08/29/2019    K 3.1 (L) 08/29/2019    CL 107 08/29/2019    CO2  08/29/2019      Comment: Specimen icteric.    BUN 13 08/29/2019    CREATININE 0.63 (L) 08/29/2019    GLU 162 08/29/2019    CALCIUM 7.6 (L) 08/29/2019    MG 1.5 (L) 08/29/2019    PHOS 2.1 (L) 08/29/2019     Lab Results   Component Value Date    BILITOT 35.0 (H) 08/29/2019    BILIDIR 29.40 (H) 08/29/2019    PROT  08/29/2019      Comment:      Specimen icteric.    ALBUMIN 2.5 (L) 08/29/2019    ALT  08/29/2019      Comment:      Specimen icteric.    AST 126 (H) 08/29/2019    ALKPHOS 785 (H) 08/29/2019    GGT 500 (H) 08/29/2019     Lab Results   Component Value Date    PT 18.0 (H) 08/29/2019    INR 1.55 08/29/2019    APTT 25.0 (L) 08/14/2019     ========================================    Steven C. Belva Chimes, MD, PhD   Anesthesiology, PGY-1      I supervised the resident physician who spent 35 minutes on the floor or unit in direct patient care. The direct patient care time included face-to-face time with the patient, reviewing the patient's chart, communicating with the family and/or other professionals and coordinating care. Greater than 50% of this time was spent in counseling or coordinating care with the patient regarding diagnosis/diagnostic considerations, planned testing, relevant test results, and treatment recommendations. I was available throughout care provided  [employer]

## 2019-08-30 NOTE — Unmapped (Signed)
Endocrinology Consult - Follow Up Note    Requesting Attending Physician :  Melba Coon, MD  Service Requesting Consult : Ped Gastroenterology [facility])  Primary Care Provider: [name], MD  Assessment/Recommendations:      Glucocorticoids induced hyperglycemia- A1c 4.9% (10/2018)- Discussed his refusal of insulin and glycemic checks at length. He agreed to our team that he would take insulin and check sugars.  ??  Plan  - Lantus 10U nightly, half dose if NPO  -??Lispro??6 units TIDAC with meals??if able  -????PRN Lispro dose for overnight meal: if a normal sized meal give 4 units, if smaller give 2 units lispro  - Continue sensitive sliding scale QACHS     ??Discussed with Dr. Leda Roys   ??  Primary team informed of recommendations. We will continue to follow patient as needed. Please call/page [phone] with questions or concerns.     Elpidio Galea, MD  PGY5 Endocrinology Fellow      Subjective/24 hour events:    Tighter glucose control with reduction in steroids. NPO overnight so only got 6U glargine. Getting liver bx today.    Objective: :  BP 98/63  - Pulse 64  - Temp 36.9 ??C (Oral)  - Resp 24  - Ht 174.6 cm (5' 8.74)  - Wt 61.5 kg (135 lb 9.3 oz)  - SpO2 100%  - BMI 20.17 kg/m??     Exam- watching show on his phone, lying in bed. Flat affect. Jaundiced.    Test Results    Lab Results   Component Value Date    POCGLU 184 (H) 08/29/2019    POCGLU 79 08/29/2019    POCGLU 212 (H) 08/29/2019    POCGLU 317 (H) 08/28/2019    POCGLU 160 08/28/2019     Lab Results   Component Value Date    CREATININE 0.63 (L) 08/29/2019    NA 136 08/29/2019    K 3.1 (L) 08/29/2019    CL 107 08/29/2019    CO2  08/29/2019      Comment:      Specimen icteric.    ANIONGAP  08/29/2019      Comment:      Unable to Calculate.    CALCIUM 7.6 (L) 08/29/2019    ALBUMIN 2.5 (L) 08/29/2019

## 2019-08-31 LAB — CALCIUM: Calcium:MCnc:Pt:Ser/Plas:Qn:: 7.6 — ABNORMAL LOW

## 2019-08-31 LAB — PARATHYROID HORMONE INTACT: Parathyrin.intact:MCnc:Pt:Ser/Plas:Qn:: 0

## 2019-08-31 LAB — COMPREHENSIVE METABOLIC PANEL
ALBUMIN: 2.6 g/dL — ABNORMAL LOW (ref 3.5–5.0)
ALKALINE PHOSPHATASE: 857 U/L — ABNORMAL HIGH (ref 65–260)
AST (SGOT): 162 U/L — ABNORMAL HIGH (ref 19–55)
BILIRUBIN TOTAL: 32.5 mg/dL — ABNORMAL HIGH (ref 0.0–1.2)
BLOOD UREA NITROGEN: 14 mg/dL (ref 7–21)
BUN / CREAT RATIO: 18
CALCIUM: 7.6 mg/dL — ABNORMAL LOW (ref 8.5–10.2)
CHLORIDE: 104 mmol/L (ref 98–107)
CREATININE: 0.78 mg/dL (ref 0.70–1.30)
EGFR CKD-EPI AA MALE: >90 mL/min/{1.73_m2} (ref >=60)
EGFR CKD-EPI NON-AA MALE: >90 mL/min/{1.73_m2} (ref >=60)
GLUCOSE RANDOM: 184 mg/dL — ABNORMAL HIGH (ref 70–99)
POTASSIUM: 3.6 mmol/L (ref 3.5–5.0)
SODIUM: 136 mmol/L (ref 135–145)

## 2019-08-31 LAB — BILIRUBIN DIRECT: Bilirubin.glucuronidated+Bilirubin.albumin bound:MCnc:Pt:Ser/Plas:Qn:: 29.7 — ABNORMAL HIGH

## 2019-08-31 LAB — PLATELET COUNT: Platelets:NCnc:Pt:Bld:Qn:Automated count: 41 — ABNORMAL LOW

## 2019-08-31 LAB — GLUCOSE RANDOM: Glucose:MCnc:Pt:Ser/Plas:Qn:: 184 — ABNORMAL HIGH

## 2019-08-31 LAB — GAMMA GLUTAMYL TRANSFERASE: Gamma glutamyl transferase:CCnc:Pt:Ser/Plas:Qn:: 518 — ABNORMAL HIGH

## 2019-08-31 LAB — MAGNESIUM
MAGNESIUM: 1.5 mg/dL — ABNORMAL LOW (ref 1.6–2.2)
Magnesium:MCnc:Pt:Ser/Plas:Qn:: 1.5 — ABNORMAL LOW

## 2019-08-31 LAB — INR: Coagulation tissue factor induced.INR:RelTime:Pt:PPP:Qn:Coag: 1.58

## 2019-08-31 LAB — PHOSPHORUS: Phosphate:MCnc:Pt:Ser/Plas:Qn:: 2.7 — ABNORMAL LOW

## 2019-08-31 LAB — TACROLIMUS, TROUGH: Lab: 6.2

## 2019-08-31 LAB — PROTIME-INR: INR: 1.58

## 2019-08-31 LAB — VITAMIN D, TOTAL (25OH): Lab: 4.3 — ABNORMAL LOW

## 2019-08-31 NOTE — Unmapped (Signed)
Problem: Adult Inpatient Plan of Care  Goal: Plan of Care Review  Outcome: Progressing  No acute events noted. Denied c/o discomfort. Afebrile. IV Ampicillin is scheduled. Enteric Precautions maintained. Stool specimens have been collected and sent to Lab. Jaundice persist. Abdomen remains distended. Bilateral lower extremity edema persist. NPO since MN for a Liver biopsy today. IV hydration at 71mls/hr infusing into RUE PICC, site w/o s/s of complications. Discussed overnight POC, verbal agreement voiced. No family at bedside. Will continue to monitor.

## 2019-08-31 NOTE — Unmapped (Signed)
Pediatric Daily Progress Note     Assessment/Plan:     Principal Problem:    Liver transplant rejection (CMS-HCC)  Active Problems:    Recurrent major depressive disorder, in partial remission (CMS-HCC)    Malnutrition of mild degree (CMS-HCC)    History of liver transplant (CMS-HCC)    Transaminitis    Normocytic anemia    Hypoalbuminemia    Steroid-induced hyperglycemia    Pityrosporum folliculitis    Abdominal pain    Homeless    Thrombocytopenia (CMS-HCC)    Hypomagnesemia    Hypophosphatasia    Other ascites    MSSA bacteremia    Hyperbilirubinemia    Impetigo lesions of Right Ear and Posterior Neck    Steroid-induced acne    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 Along with steroids, tacrolimus, and cellcept, thyroglobulin treatment was initiated (on 10/01-10/12) but yielded limited benefit in liver studies to date. Patient has discussed prognosis with Dr. Melrose Nakayama and palliative care including the possibility of not recovering from liver rejection (10/9). Patient understands that transplant is an option but that he may not be a candidate. His main active issue currently is treating CLABSI secondary to strep mitis and MSSA (found after cultures obtained secondary to leukocytosis on daily labs) with Unasyn for a total of a 14 day course (through 10/27) and salvaging his PICC line for now given difficulty with access. Due to his infection all of his immunosuppressants (thymoblobulin, Cellcept, tacrolimus), were held, his steroids were weaned, and his repeat liver biopsy were delayed until his bloodstream infection was adequately managed.  On 10/19, his immunosuppressants were restarted, and biopsy was rescheduled. His other active issues are complications secondary to liver dysfunction (hypoalbuminemia, ascites, edema, abdominal pain, elevated INR, electrolyte abnormalities). Currently his ascites/edema is stable; we will continue to monitor closely and consider albumin/lasix as needed. He also has steroid-induced hyperglycemia, currently being managed on insulin with daily adjustments.     #Hx Liver Transplant - Subacute/Acute Liver Failure: ??Biopsy confirmed severe acute rejection (9/25), RAI 9/9. Liver labs without a significant improvement after immunosuppression. S/p 12 days of ATG and high dose MethylPrednisolone (10/1-10/12) with poor overall response to therapy. Labs today with Tbili 32.5 (33.9), Dbili 29.70 (30/.80) , GGT 518 (537) (500).  Tranplant team recommended repeat liver biopsy.  - Liver biopsy today   - Given plts of 41, transfuse 1 unit plts   - Would prefer plts >50 and INR >1.5 prior to procedure   - Resume regular diet post-procedure  - Restarted Tacro and Mycophenolate on 10/20    -10/21 Tac trough 6.2 (goal 8-10) --> continue 3 mg am, increase pm dose to 3.5 mg nightly   -Appreciate pharmacy assistance  - Continue prednisone 40mg  today  - Continue Valgancyclovir 450mg  daily (3months), Bactrim MWF (55mo) for prophylaxis (10/2-) and Nystatin 10ml TID (65mo) prophylaxis (10/2-)  - Vitamin K IV daily without change. Can discuss switching to PO with Dr. Melrose Nakayama    -F/u Des-gamma-carboxy-prothrombin- if pt low in vit K, should result as  elevated  - Protonix 40 mg PO daily  - Continue Rifaximin (given elevated ammonia, no encephalopathy)   - PT/INR on M/Th   - 10/21 liver transplant committee determined patient should proceed with complete liver transplant evaluation    # Acute Diarrhea: Patient endorsed 3x episodes of diarrhea on 10/20 overnight which has now resolved.  Given patient is immunocompromised will complete a thorough work-up. Cdiff negative.   - F/u GIPP, stool ova and parasite, EBV, CMV    #CLABSI: Strep mitis and MSSA   Developed sudden leukocytosis with white count elevated to 17.3 10/12, afebrile, non-toxic appearing, without localizing symptoms. Found to have positive Bcx 10/12 growing strep mitis and MSSA. Given ascites and risk for SBP secondary to strep mitis, also covering for anaerobes. Echo negative for endocarditis.   - CBCd M/Th  - 10/12 peripheral/PICC Cx positive for Strep mitis   - 1/012 peripheral - +MSAA  - f/u Bcx:   - 10/12 - NGTD   - 10/13 - NGTD   - 10/14 - NGTD  - Per ID recs:   - Unasyn x 14 days total (10/13-10/27)   - Given unreliable PIV access, will attempt to salvage his PICC while treating his infection   - Draw new bcx if any repeat cultures turn positive     #Edema, ascites, abdominal pain secondary to liver dysfunction: Ascites still present, stable.   - Holding IVFs, taking good PO  - Following daily weights  - STRICT I/Os      #Hypoalbuminemia: Albumin 2.6 (2.6)  - Likely contributing to ascites/edema    #Nutriton: severe protein-calorie malnutrition. Prior to admission, patient has been skipping meals due to poor appetite and food insecurity.  - Continue goal PO fluid intake of 1.6L   - Regular diet +  Ensure supplements post liver bx  - Ergocalciferol qMon   - Aquadex multivitamin    - Ca 300 mg BID    #Electrolyte abnormalities: hypomagnesemia, hypophosphatemia: Mg 1.5 (1.7), 2.7 (2.4)  - Daily CMP, Mg, Phos  - Urine labs--K 9.1, Phos 23.2 , Mag 3.1,Cr 29.6    -PTH initially read as elevated to 263.1 but now showing invalid result   -F/u PTHrP, Vit D  - Hypomagnesemia   - mag sulphate IV prn (10/15, 10/17, 10/21))  - Hypophosphatemia    - k-phos daily (10/16-), increased to TID on 10/19     #Constipation: currently recovering from brief episodes of diarrhea, so has not needed miralax  - Miralax 17 g BID PRN     #Steroid induced hyperglycemia:??A1c??4.9% but with postprandial hyperglycemia. Still on PO prednisone 40mg  daily. Patient has been NPO for liver biopsy so lantus decreased to 6 units nightly with q4h glucose checks and SSI.   -Increase to Lantus 10 units at bedtime (give 6 units if NPO)  -Restart nutritional insulin when eating: Lispro 4 units with meals (Hold if NPO)  -Continue PRN Lispro dose for overnight meal: 4 units for normal-large meal, 2 units for smaller meal  -Continue Lispro correction 1:50>150 ACHS  -Endocrine following, appreciate    #Thrombocytopenia: Plts low at 41 today.   - s/p plt transfusion on 10/13, 10/15 for PLT of 50k   - Transfuse 1 unit platelets in prep for liver bx  - F/u post-transfusion platelet count    #Impetigo Skin Lesions on Right Ear and Left posterior Neck, + MSSA, healing well   - Mupirocin ointment for both lesions until healed, per derm    #Steroid-induced acne,  possible fungal folliculitis   - topical clindamycin, benzoyl peroxide, Ketoconazole shampoo    ??  #Major Depressive Disorder, insomnia: Stable. Has hx of attempted overdoses, with the last attempt in April 2019??at which point he was hospitalized in the Adolescent Psychiatry unit and discharged on Lexapro 20 mg. ??He has not engaged with mental health resources or taken medication for depression or insomnia in 8-10 months.????Denies SI, passive suicidality, self-harm, or HI at this time.    - Patient saw adult psychiatry team again on 10/13. Offered SSRI, pt still considering   - Will only see adult psychiatry   - melatonin 6 mg 1 hour before bedtime    #Deconditioning. Patient endorses feeling weak and having limited mobility.   -PT following  -Encourage OOB and ambulation    #Homelessness - Social Concerns:??Torrian has experienced homelessness for ~5 months when he left his mother's house. ??He has severed all ties with his family and states his relationship with his mother cannot be reconciled. ??He also requested contact information of family members to be erased from his chart (this has been completed, but social worker did drop contact info into her note on 9/24) and he more recently has reconciled with some family members. ??In addition to food insecurity, he has not been able to secure medications. ??He is routinely exposed to alcohol and drugs (marijuana, cocaine, etc.) second-hand through his friendships though he denies current use himself. ??On urine drug screen, he tested positive for marijuana only.  -Supportive care following.  -Designated a personal friend [name]) and grandfather to be his HCPOAs (Friend is the Primary).   -Spoke with sister on 10/16 (has not been in regular contact)   -Paternal grandmother now visiting regularly   ??  Access: PICC (placed 9/28), will need removal if he becomes unstable in the setting of CLABSI infection   ??  Discharge criteria:??liver transplant work-up and treatment of acute rejection    Subjective:  patient states had 1x BM and is no longer diarrhea. Otherwise, patient agreeable to glucose checks and understands it is important to keep his glucose well controlled. He was anxious for his liver biopsy today.    Objective:     Vital signs in last 24 hours:  Temp:  [36.8 ??C-37.2 ??C] 37 ??C  Heart Rate:  [74-87] 87  Resp:  [17-24] 17  BP: (100-125)/(63-86) 100/78  MAP (mmHg):  [76-96] 79  SpO2:  [100 %] 100 %  Intake/Output last 3 shifts:  I/O last 3 completed shifts:  In: 1280 [P.O.:300; I.V.:980]  Out: 1750 [Urine:1750]    Physical Exam:   General:  laying in bed, awake. Thin appearing. Interactive and conversational   Head: normocephalic, atraumatic.  Eyes: EOMI. Marked scleral icterus present.   Nose:   clear, no discharge  Lungs: clear breath sounds bilaterally. No increased work of breathing. No wheezes/crackles. Good aeration at the bases.   Heart: regular rate and rhythm. No murmurs appreciated on auscultation.  Abdomen:  Abdomen protuberant. Ascites present. No HSM.   Neuro: deconditioned, moves all extremities with decreased strength.   Extremities: non-pitting edema in the LE, WWP  Skin: Skin lesions on left neck, healing well. Skin lesion present on right ear with scabbed. Acneform rash present on face, back, chest.     Active Medications reviewed and KEY Medications include:     Current Facility-Administered Medications:   ???  ampicillin-sulbactam dilution (UNASYN) injection 3 g, 3 g, Intravenous, Q6H, David Stall, MD, 3 g at 08/31/19 1435  ???  benzoyl peroxide 5 % gel, , Topical, Nightly, Alfredia Ferguson, MD  ???  calcium carbonate (OS-CAL) tablet 300 mg elem calcium, 300 mg elem calcium, Oral, BID, David Stall, MD, 300 mg elem calcium at 08/31/19 1010  ???  clindamycin (CLEOCIN T) 1 % lotion 1 application, 1 application, Topical, daily, Alfredia Ferguson, MD, 1 application at 08/29/19 1649  ???  dextrose 50 % in water (D50W) 50 % solution 12.5 g, 12.5 g, Intravenous, Q10 Min PRN, David Stall, MD  ???  ergocalciferol (DRISDOL) capsule 50,000 Units, 50,000 Units, Oral, Weekly, David Stall, MD, 50,000 Units at 08/29/19 [phone]  ???  heparin preservative-free injection 10 units/mL syringe (HEPARIN LOCK FLUSH), 20 Units, Intravenous, Daily PRN, David Stall, MD, 20 Units at 08/27/19 1524  ???  influenza vaccine quad (FLUARIX, FLULAVAL, FLUZONE) (6 MOS & UP) 2020-21, 0.5 mL, Intramuscular, During hospitalization, David Stall, MD  ???  insulin glargine (LANTUS) injection 10 Units, 10 Units, Subcutaneous, Nightly, Chinelo Philip Aspen, MD  ???  insulin lispro (HumaLOG) injection 0-5 Units, 0-5 Units, Subcutaneous, ACHS, Lovie Chol, MD  ???  insulin lispro (HumaLOG) injection 2-4 Units, 2-4 Units, Subcutaneous, Nightly PRN, David Stall, MD, 2 Units at 08/29/19 0103  ???  insulin lispro (HumaLOG) injection 4 Units, 4 Units, Subcutaneous, TID AC, Chinelo C Okigbo, MD  ???  ketoconazole (NIZORAL) 2 % shampoo, , Topical, daily, Alfredia Ferguson, MD  ???  melatonin tablet 6 mg, 6 mg, Oral, QPM, David Stall, MD, 6 mg at 08/30/19 2113  ???  multivitamin, with zinc (AQUADEKS) chewable tablet, 2 tablet, Oral, Daily, David Stall, MD, 2 tablet at 08/31/19 0816  ???  mupirocin (BACTROBAN) 2 % ointment, , Topical, TID, David Stall, MD  ???  mycophenolate (MYFORTIC) EC tablet 540 mg, 540 mg, Oral, BID, David Stall, MD, 540 mg at 08/31/19 0816  ???  nystatin (MYCOSTATIN) oral suspension, 1,000,000 Units, Oral, TID, David Stall, MD, 1,000,000 Units at 08/31/19 1435  ???  ondansetron (ZOFRAN-ODT) disintegrating tablet 4 mg, 4 mg, Oral, Q8H PRN, David Stall, MD  ???  pantoprazole (PROTONIX) EC tablet 40 mg, 40 mg, Oral, Daily, David Stall, MD, 40 mg at 08/30/19 2122  ???  phytonadione (vitamin K1) (AQUA-MEPHYTON) 7.5 mg in sodium chloride (NS) 0.9 % 50 mL IVPB, 7.5 mg, Intravenous, Daily, Lewie Loron, MD, Last Rate: 173.3 mL/hr at 08/31/19 0903, 7.5 mg at 08/31/19 8119  ???  polyethylene glycol (MIRALAX) packet 17 g, 17 g, Oral, BID PRN, David Stall, MD  ???  potassium phosphate (monobasic) (K-PHOS) tablet 500 mg, 500 mg, Oral, TID, David Stall, MD, 500 mg at 08/31/19 1400  ???  predniSONE (DELTASONE) tablet 40 mg, 40 mg, Oral, Daily, David Stall, MD, 40 mg at 08/31/19 0816  ???  rifAXIMin (XIFAXAN) tablet 550 mg, 550 mg, Oral, BID, David Stall, MD, 550 mg at 08/31/19 0816  ???  sodium chloride (NS) 0.9 % infusion, , Intravenous, Continuous, David Stall, MD, Last Rate: 10 mL/hr at 08/26/19 0537, 1,000 mL at 08/27/19 0731  ???  sodium chloride (NS) 0.9 % infusion, 10 mL/hr, Intravenous, Continuous, Ejiofor A Lucky Cowboy., MD, Last Rate: 10 mL/hr at 08/30/19 1931, 10 mL/hr at 08/30/19 1931  ???  sulfamethoxazole-trimethoprim (BACTRIM) 400-80 mg tablet 80 mg of trimethoprim, 1 tablet, Oral, Q MWF, David Stall, MD, 80 mg of trimethoprim at 08/31/19 0816  ???  [START ON 09/01/2019]  tacrolimus (PROGRAF) capsule 3 mg, 3 mg, Oral, Daily, Alfredia Ferguson, MD  ???  tacrolimus (PROGRAF) capsule 3.5 mg, 3.5 mg, Oral, Nightly (2000), Alfredia Ferguson, MD  ???  valGANciclovir (VALCYTE) tablet 450 mg, 450 mg, Oral, Daily, David Stall, MD, 450 mg at 08/30/19 1727        Studies: Personally reviewed and interpreted.  Labs/Studies:  Labs and Studies from the last 24hrs per EMR and Reviewed and     Lab Results   Component Value Date    WBC 4.8 08/29/2019    HGB 9.8 (L) 08/29/2019    HCT 28.6 (L) 08/29/2019    PLT 41 (L) 08/31/2019       Lab Results   Component Value Date    NA 136 08/31/2019    K 3.6 08/31/2019    CL 104 08/31/2019    CO2  08/31/2019      Comment:      Specimen icteric.  This is a corrected result. Previous result was 24.0 mmol/L on 08/31/2019 at 0722 EDT    BUN 14 08/31/2019    CREATININE 0.78 08/31/2019    GLU 184 (H) 08/31/2019    CALCIUM 7.6 (L) 08/31/2019    CALCIUM 7.6 (L) 08/31/2019    CALCIUM 7.6 (L) 08/31/2019    MG 1.5 (L) 08/31/2019    PHOS 2.7 (L) 08/31/2019     Lab Results   Component Value Date    BILITOT 32.5 (H) 08/31/2019    BILIDIR 29.70 (H) 08/31/2019    PROT  08/31/2019      Comment:      Specimen icteric.  This is a corrected result. Previous result was 5.9 g/dL on 28/41/3244 at 0102 EDT    ALBUMIN 2.6 (L) 08/31/2019    ALT  08/31/2019      Comment:      Specimen icteric.  This is a corrected result. Previous result was 299 U/L on 08/31/2019 at 0722 EDT    AST 162 (H) 08/31/2019    ALKPHOS 857 (H) 08/31/2019    GGT 518 (H) 08/31/2019     Lab Results   Component Value Date    PT 18.3 (H) 08/31/2019    INR 1.58 08/31/2019    APTT 25.0 (L) 08/14/2019     ========================================  Jonna Clark, MD  Anesthesiology - PGY-1  Pager: [phone]  I??supervised the resident physician who spent 35 minutes on the floor or unit in direct patient care. The direct patient care time included face-to-face time with the patient, reviewing the patient's chart, communicating with the family and/or other professionals and coordinating care. Greater than 50% of this time was spent in counseling or coordinating care with the patient regarding diagnosis/diagnostic considerations, planned testing, relevant test results, and treatment recommendations. I was available throughout care provided  [employer]

## 2019-08-31 NOTE — Unmapped (Signed)
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    Assessment  Personal Factors/Comorbidities Present: 3  Specific Comorbidities : h/o cholangitis, s/p liver transplant, now in liver transplant rejection  Examination of body systems: 3 elements  Clinical Decision Making: Moderate    Assessment: [name] Gross is a 18y.o. 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 He presents to OT this date semi-reclined in bed, hiding under a blanket on his phone, awake. Pt denies need for therapy services and indicates he is independent with all self care. He demonstrates intact strength and ROM of BUEs today. Per nursing notes, pt has been requesting assistance with toileting hygiene, however he demonstrates he is able to complete this on his own and was educated today on the importance of completing his own self care as much as possible. Pt verbalized understanding of all recommendations. He was left semi-reclined in bed with all needs met and RN aware of status. No further acute OT needs identified at this time. Please re-consult if there is a change in functional status.  Today's Interventions: Education - Patient, Functional mobility, Range of motion, Safety education    Activity Tolerance During Today's Session  Patient tolerated treatment well    Plan  Planned Frequency of Treatment:  D/C Services for: D/C Services Post-Discharge Occupational Therapy Recommendations:  OT Post Acute Discharge Recommendations: OT services not indicated   OT DME Recommendations: None    GOALS:   Patient and Family Goals: Get out of the hospital soon      Prognosis:  Good  Barriers to Discharge: Other(medical status)    Subjective  Current Status Pt was received and left semi-reclined in bed with all needs met.  Prior Functional Status Pt is independent with ADLs and mobility at baseline.          Patient / Caregiver reports: Pt reports I can do everything, I don't need help.    Past Medical History:   Diagnosis Date   ??? 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 Depression    ??? Seasonal allergies    ??? Sickle cell trait (CMS-HCC)     Social History     Tobacco Use   ??? Smoking status: Never Smoker   ??? Smokeless tobacco: Never Used   Substance Use Topics   ??? Alcohol use: Never     Alcohol/week: 0.0 standard drinks     Frequency: Never     Comment: 1/2 glass of wine on rare occasions      Past Surgical History:   Procedure Laterality Date   ??? FRENULECTOMY, LINGUAL     ??? PR ERCP REMOVE FOREIGN BODY/STENT BILIARY/PANC DUCT N/A 08/20/2017    Procedure: ENDOSCOPIC RETROGRADE CHOLANGIOPANCREATOGRAPHY (ERCP); W/ REMOVAL  OF FOREIGN BODY/STENT FROM BILIARY/PANCREATIC DUCT(S);  Surgeon: Vonda Antigua, MD;  Location: [facility]URES [facility];  Service: Gastroenterology   ??? PR ERCP REMOVE FOREIGN BODY/STENT BILIARY/PANC DUCT N/A 06/04/2018    Procedure: ENDOSCOPIC RETROGRADE CHOLANGIOPANCREATOGRAPHY (ERCP); W/ REMOVAL OF FOREIGN BODY/STENT FROM BILIARY/PANCREATIC DUCT(S);  Surgeon: Chriss Driver, MD;  Location: GI PROC[facility]URES [facility];  Service: Gastroenterology   ??? PR ERCP STENT PLACEMENT BILIARY/PANCREATIC DUCT N/A 11/21/2016    Procedure: ENDOSCOPIC RETROGRADE CHOLANGIOPANCREATOGRAPHY (ERCP); WITH PLACEMENT OF ENDOSCOPIC STENT INTO BILIARY OR PANCREATIC DUCT;  Surgeon: Vonda Antigua, MD;  Location: GI [facility]URES [facility];  Service: Gastroenterology   ??? PR ERCP,W/REMOVAL STONE,BIL/PANCR DUCTS N/A 08/20/2017    Procedure: ERCP; W/ENDOSCOPIC RETROGRADE REMOVAL OF CALCULUS/CALCULI FROM BILIARY &/OR PANCREATIC DUCTS;  Surgeon: Vonda Antigua, MD;  Location: GI [facility]URES [facility];  Service: Gastroenterology   ??? PR ERCP,W/REMOVAL STONE,BIL/PANCR DUCTS N/A 06/04/2018    Procedure: ERCP; W/ENDOSCOPIC RETROGRADE REMOVAL OF CALCULUS/CALCULI FROM BILIARY &/OR PANCREATIC DUCTS;  Surgeon: Chriss Driver, MD;  Location: GI [facility];  Service: Gastroenterology   ??? PR TRANSPLANT LIVER,ALLOTRANSPLANT N/A 09/22/2016    Procedure: LIVER ALLOTRANSPLANTATION; ORTHOTOPIC, PARTIAL OR WHOLE, FROM CADAVER OR LIVING DONOR, ANY AGE;  Surgeon: Doyce Loose, MD;  Location: MAIN OR [facility];  Service: Transplant   ??? PR TRANSPLANT,PREP DONOR LIVER, WHOLE N/A 09/22/2016    Procedure: [facility] STD PREP CAD DONOR WHOLE LIVER GFT PRIOR TNSPLNT,INC CHOLE,DISS/REM SURR TISSU WO TRISEG/LOBE SPLT;  Surgeon: Doyce Loose, MD;  Location: MAIN OR [facility];  Service: Transplant   ??? PR UPGI ENDOSCOPY W/US FN BX N/A 08/20/2017    Procedure: UGI W/ TRANSENDOSCOPIC ULTRASOUND GUIDED INTRAMURAL/TRANSMURAL FINE NEEDLE ASPIRATION/BIOPSY(S), ESOPHAGUS;  Surgeon: Vonda Antigua, MD;  Location: GI [facility]URES [facility];  Service: Gastroenterology   ??? PR UPPER GI ENDOSCOPY,BIOPSY N/A 07/15/2016    Procedure: UGI ENDOSCOPY; WITH BIOPSY, SINGLE OR MULTIPLE;  Surgeon: Arnold Long Mir, MD;  Location: PEDS PROCEDURE ROOM [facility];  Service: Gastroenterology   ??? PR UPPER GI ENDOSCOPY,CTRL BLEED Left 05/05/2016    Procedure: UGI ENDOSCOPY; WITH CONTROL OF BLEEDING, ANY METHOD;  Surgeon: Arnold Long Mir, MD;  Location: PEDS PROCEDURE ROOM [facility];  Service: Gastroenterology   ??? PR UPPER GI ENDOSCOPY,CTRL BLEED N/A 05/12/2016    Procedure: UGI ENDOSCOPY; WITH CONTROL OF BLEEDING, ANY METHOD;  Surgeon: Annie Paras, MD;  Location: CHILDRENS OR [facility];  Service: Gastroenterology    Family History   Problem Relation Age of Onset   ??? Diabetes Maternal Grandmother    ??? Diabetes Paternal Grandmother    ??? Hypertension Maternal Grandfather    ??? Hypertension Paternal Grandfather    ??? Asthma Brother    ??? Anesthesia problems Neg Hx         Bee pollen and Pollen extracts     Objective Findings  Precautions / Restrictions  Falls precautions, Isolation precautions    Weight Bearing  Non-applicable    Required Braces or Orthoses  Non-applicable    Communication Preference  Verbal    Pain  Denies pain.    Equipment / Environment  Vascular access (PIV, TLC, Port-a-cath, PICC), Patient not wearing mask for full session    Living Situation  Living Environment: (d/c location TBD 2/2 complex social situation and h/o homelessness)     Cognition   Orientation Level:  Oriented x 4   Arousal/Alertness:  Appropriate responses to stimuli   Attention Span:  Appears intact   Memory:  Appears intact  Following Commands:  Follows all commands and directions without difficulty   Safety Judgment:  Good awareness of safety precautions    Vision / Perception    Hearing: WNL   Vision: Wears glasses all the time  Perception: WNL       Hand Function  Hand Dominance: R hand dominant  WNL    Skin Inspection  c/d/i as visualized    ROM / Strength/Coordination  UE ROM/ Strength/ Coordination: WNL BUEs       Sensation:  WNL    Balance:  independent    Functional Mobility  Transfer Assistance Needed: No  Bed Mobility Assistance Needed: No      ADLs  ADLs: Independent      Vitals / Orthostatics  Orthostatics: VSS      Medical Staff Made Aware: RN Mariel      Occupational Therapy Session Duration  OT Individual - Duration: 8         I attest that I have reviewed the above information.  Signed: Morene Antu, OT  Filed 08/31/2019

## 2019-08-31 NOTE — Unmapped (Signed)
Pharmacy Pre-transplant Evaluation/ Selection Committee Note    Evaluation for liver transplant    Patient:  [name] Gross, Age:  18 y.o. old, Gender:  male    Allergies:  Bee pollen and Pollen extracts    Immunization history:    Immunization History   Administered Date(s) Administered   ??? DTaP 06/12/2000, 10/13/2000, 01/06/2001, 08/16/2001, 06/24/2004   ??? HPV Quadrivalent (Gardasil) 04/17/2011, 05/27/2012, 05/11/2013   ??? Hepatitis A 09/11/2006, 01/23/2009   ??? Hepatitis B vaccine, pediatric/adolescent dosage, 11/09/00, 04/24/2000, 01/06/2001   ??? HiB-PRP-OMP 06/12/2000, 10/13/2000, 01/06/2001, 05/18/2001   ??? Influenza LAIV (Nasal-Quad) 2-76yrs 09/14/2014   ??? Influenza Vaccine Quad (IIV4 PF) 71mo+ injectable 10/12/2017   ??? MMR 05/18/2001, 06/24/2004   ??? Meningococcal Conjugate MCV4P 04/17/2011, 05/22/2016   ??? PPD Test 05/17/2016   ??? Pneumococcal Conjugate 13-Valent 06/12/2000, 10/13/2000, 01/06/2001   ??? Poliovirus, inactivated (IPV) 06/12/2000, 10/13/2000, 05/18/2001, 06/24/2004   ??? TdaP 04/17/2011   ??? Varicella 05/18/2001, 07/24/2005, 09/11/2006       Alcohol, tobacco, illicit drug use history:  Social History     Substance and Sexual Activity   Alcohol Use Never   ??? Alcohol/week: 0.0 standard drinks   ??? Frequency: Never    Comment: 1/2 glass of wine on rare occasions     Social History     Tobacco Use   Smoking Status Never Smoker   Smokeless Tobacco Never Used     Social History     Substance and Sexual Activity   Drug Use Never       Home medications:  Facility-Administered Encounter Medications as of 08/31/2019   Medication Dose Route Frequency Provider Last Rate Last Dose   ??? ampicillin-sulbactam dilution (UNASYN) injection 3 g  3 g Intravenous Q6H David Stall, MD   3 g at 08/31/19 0817   ??? benzoyl peroxide 5 % gel   Topical Nightly Alfredia Ferguson, MD ??? calcium carbonate (OS-CAL) tablet 300 mg elem calcium  300 mg elem calcium Oral BID David Stall, MD   300 mg elem calcium at 08/31/19 1010   ??? clindamycin (CLEOCIN T) 1 % lotion 1 application  1 application Topical daily Alfredia Ferguson, MD   1 application at 08/29/19 1649   ??? dextrose 50 % in water (D50W) 50 % solution 12.5 g  12.5 g Intravenous Q10 Min PRN David Stall, MD       ??? ergocalciferol (DRISDOL) capsule 50,000 Units  50,000 Units Oral Weekly David Stall, MD   50,000 Units at 08/29/19 [phone]   ??? heparin preservative-free injection 10 units/mL syringe (HEPARIN LOCK FLUSH)  20 Units Intravenous Daily PRN David Stall, MD   20 Units at 08/27/19 1524   ??? influenza vaccine quad (FLUARIX, FLULAVAL, FLUZONE) (6 MOS & UP) 2020-21  0.5 mL Intramuscular During hospitalization David Stall, MD       ??? insulin glargine (LANTUS) injection 8 Units  8 Units Subcutaneous Nightly Chinelo Philip Aspen, MD       ??? insulin lispro (HumaLOG) injection 0-5 Units  0-5 Units Subcutaneous ACHS Lovie Chol, MD       ??? insulin lispro (HumaLOG) injection 2-4 Units  2-4 Units Subcutaneous Nightly PRN David Stall, MD   2 Units at 08/29/19 0103   ??? insulin lispro (HumaLOG) injection 4 Units  4 Units Subcutaneous TID AC Chinelo C Okigbo, MD       ??? ketoconazole (NIZORAL) 2 % shampoo   Topical daily  Alfredia Ferguson, MD       ??? [COMPLETED] magnesium sulfate dilution 100 mg/mL (CENTRAL LINE) injection 2,000 mg  50 mg/kg Intravenous Once Melba Coon, MD   2,000 mg at 08/31/19 1010   ??? melatonin tablet 6 mg  6 mg Oral QPM David Stall, MD   6 mg at 08/30/19 2113   ??? multivitamin, with zinc (AQUADEKS) chewable tablet  2 tablet Oral Daily David Stall, MD   2 tablet at 08/31/19 0816   ??? mupirocin (BACTROBAN) 2 % ointment   Topical TID David Stall, MD ??? mycophenolate (MYFORTIC) EC tablet 540 mg  540 mg Oral BID David Stall, MD   540 mg at 08/31/19 0816   ??? nystatin (MYCOSTATIN) oral suspension  1,000,000 Units Oral TID David Stall, MD   1,000,000 Units at 08/31/19 0816   ??? ondansetron (ZOFRAN-ODT) disintegrating tablet 4 mg  4 mg Oral Q8H PRN David Stall, MD       ??? pantoprazole (PROTONIX) EC tablet 40 mg  40 mg Oral Daily David Stall, MD   40 mg at 08/30/19 2122   ??? phytonadione (vitamin K1) (AQUA-MEPHYTON) 7.5 mg in sodium chloride (NS) 0.9 % 50 mL IVPB  7.5 mg Intravenous Daily Lewie Loron, MD 173.3 mL/hr at 08/31/19 0903 7.5 mg at 08/31/19 0981   ??? polyethylene glycol (MIRALAX) packet 17 g  17 g Oral BID PRN David Stall, MD       ??? potassium phosphate (monobasic) (K-PHOS) tablet 500 mg  500 mg Oral TID David Stall, MD   500 mg at 08/31/19 0816   ??? predniSONE (DELTASONE) tablet 40 mg  40 mg Oral Daily David Stall, MD   40 mg at 08/31/19 0816   ??? rifAXIMin (XIFAXAN) tablet 550 mg  550 mg Oral BID David Stall, MD   550 mg at 08/31/19 0816   ??? sodium chloride (NS) 0.9 % infusion   Intravenous Continuous David Stall, MD 10 mL/hr at 08/26/19 0537 1,000 mL at 08/27/19 0731   ??? sodium chloride (NS) 0.9 % infusion  10 mL/hr Intravenous Continuous Ejiofor A Lucky Cowboy., MD 10 mL/hr at 08/30/19 1931 10 mL/hr at 08/30/19 1931   ??? sulfamethoxazole-trimethoprim (BACTRIM) 400-80 mg tablet 80 mg of trimethoprim  1 tablet Oral Q MWF David Stall, MD   80 mg of trimethoprim at 08/31/19 0816   ??? tacrolimus (PROGRAF) capsule 3 mg  3 mg Oral BID David Stall, MD   3 mg at 08/31/19 0830   ??? valGANciclovir (VALCYTE) tablet 450 mg  450 mg Oral Daily David Stall, MD   450 mg at 08/30/19 1727   ??? [DISCONTINUED] insulin glargine (LANTUS) injection 6 Units  6 Units Subcutaneous Nightly Val Eagle   6 Units at 08/30/19 2247 ??? [DISCONTINUED] insulin lispro (HumaLOG) injection 0-5 Units  0-5 Units Subcutaneous ACHS Jonna Clark, MD   2 Units at 08/30/19 2249   ??? [DISCONTINUED] insulin lispro (HumaLOG) injection 0-5 Units  0-5 Units Subcutaneous Q4H Alfredia Ferguson, MD       ??? [DISCONTINUED] insulin lispro (HumaLOG) injection 6 Units  6 Units Subcutaneous TID AC Alfredia Ferguson, MD   6 Units at 08/30/19 1450   ??? [DISCONTINUED] magnesium sulfate 2gm/37mL IVPB  2 g Intravenous Once Jonna Clark, MD         Outpatient Encounter Medications as of 08/31/2019   Medication Sig Dispense Refill   ???  ACCU-CHEK FASTCLIX LANCET DRUM Misc U TO CHECK BS 6 TIMES D  3   ??? blood sugar diagnostic Strp Dispense 100 blood glucose test strips, ok to sub any brand preferred by insurance/patient, use 3x/day.  Patient has accuchek guide. 100 strip 12   ??? insulin glargine (LANTUS) 100 unit/mL injection Inject 0.08 mL (8 Units total) under the skin nightly. 3 mL 3   ??? insulin lispro (HUMALOG) 100 unit/mL injection Inject 0.12 mL (12 Units total) under the skin Three (3) times a day before meals. Take 6u with breakfast, 12u with lunch, & 8u with dinner. 9 mL 3   ??? insulin syringe-needle U-100 1 mL 31 gauge x 5/16 Syrg U QID  12   ??? multivitamins, therapeutic with minerals 9 mg iron-400 mcg tablet Take 1 tablet by mouth daily. (Patient not taking: Reported on 10/13/2018) 60 tablet 0   ??? mycophenolate (MYFORTIC) 180 MG EC tablet Take 3 tablets (540 mg total) by mouth Two (2) times a day. 180 tablet 11   ??? tacrolimus (PROGRAF) 5 MG capsule Take 1 capsule (5 mg total) by mouth two (2) times a day. 60 capsule 11   ??? [DISCONTINUED] mirtazapine (REMERON) 15 MG tablet Take 1 tablet (15 mg total) by mouth nightly. 30 tablet 0       Potential pharmacotherapy related concerns for transplantation:  1. anticoagulation concerns: N/A  2. cytochrome P-450 isoenzyme???mediated drug interactions:   none  3. other drug-interactions with medications post-transplant:  none 4. mental health???related medication(s):   none  5. chronic pain???related medication use:  none  6. use of hormonal contraception and replacement therapy:  none  7. prior or current use of immunosuppressants:tacrolimus, mycophenolate - prior liver transplant 09/22/2016  8. issues with drug absorption: none   9. use of herbal supplements:  none to transplant team's knowledge    Recommendations:  1. No pharmacological concerns for transplantation    Spent 15 minutes completing medication review and addressing any pharmacological concerns with members of the multidisciplinary transplant team.    Thank you,  Crista Curb, PharmD

## 2019-08-31 NOTE — Unmapped (Signed)
Endocrinology Consult - Follow Up Note    Requesting Attending Physician :  Melba Coon, MD  Service Requesting Consult : Ped Gastroenterology [facility])  Primary Care Provider: [name], MD  Outpatient Endocrinologist: N/A    Assessment/Recommendations:  [name] Gross is a 18 y.o. male who is admitted for Liver transplant rejection (CMS-HCC). Endocrinology have been consulted to evaluate patient for hyperglycemia.    Steroid induced hyperglycemia: A1c 4.9% but with postprandial hyperglycemia. Still on PO prednisone 40mg  daily. Recommend the following regimen:  -Increase to Lantus 10 units at bedtime (give 6 units if NPO)  -Restart nutritional insulin when eating: Lispro 4 units with meals (Hold if NPO)  -Continue PRN Lispro dose for overnight meal: 4 units for normal-large meal, 2 units for smaller meal  -Continue Lispro correction 1:50>150 ACHS    The patient was discussed with attending, Dr. Aleen Campi    Primary team informed of recommendations. We will continue to follow patient as needed. Please call/page [phone] with questions or concerns.     Farrel Conners, MD  [facility] Endocrinology Fellow      History of Present Illness:     Reason for Consult: hyperglycemia.     [name] Gross is a 18 y.o. male who is admitted for liver transplant rejection (CMS-HCC). Endocrinology have been consulted to evaluate patient for hyperglycemia.    Interval history: Still having glycemic excursions with 24hr BG range of 99-219. FBG today 184. NPO today for liver biopsy.    Current regimen: Lantus 6 units at bedtime, prn lispro for late night eating 2-4u, lispro 1:50>150 q4hrs    Active Orders   Diet    NPO Sips with meds; Operating room       Review of Systems:  A 12 system review of systems was negative except as noted in HPI.    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    Moderate acute cellular rejection with prominent centrilobular venulitis, RAI = 7/9 (portal inflammation: 3, bile duct inflammation/damage: 1, venous endothelial inflammation: 3)    Depression     Seasonal allergies     Sickle cell trait (CMS-HCC)        Allergies:  Bee pollen and Pollen extracts    All Medications:   Current Facility-Administered Medications   Medication Dose Route Frequency Provider Last Rate Last Dose    ampicillin-sulbactam dilution (UNASYN) injection 3 g  3 g Intravenous Q6H David Stall, MD   3 g at 08/31/19 0817    benzoyl peroxide 5 % gel   Topical Nightly Alfredia Ferguson, MD        calcium carbonate (OS-CAL) tablet 300 mg elem calcium  300 mg elem calcium Oral BID David Stall, MD   300 mg elem calcium at 08/31/19 1010    clindamycin (CLEOCIN T) 1 % lotion 1 application  1 application Topical daily Alfredia Ferguson, MD   1 application at 08/29/19 1649    dextrose 50 % in water (D50W) 50 % solution 12.5 g  12.5 g Intravenous Q10 Min PRN David Stall, MD        ergocalciferol (DRISDOL) capsule 50,000 Units  50,000 Units Oral Weekly David Stall, MD   50,000 Units at 08/29/19 0838    heparin preservative-free injection 10 units/mL syringe (HEPARIN LOCK FLUSH)  20 Units Intravenous Daily PRN David Stall, MD  20 Units at 08/27/19 1524    influenza vaccine quad (FLUARIX, FLULAVAL, FLUZONE) (6 MOS & UP) 2020-21  0.5 mL Intramuscular During hospitalization David Stall, MD        insulin glargine (LANTUS) injection 8 Units  8 Units Subcutaneous Nightly Denyla Cortese C Kalev Temme, MD        insulin lispro (HumaLOG) injection 0-5 Units  0-5 Units Subcutaneous ACHS Jiah Bari C Ashni Lonzo, MD        insulin lispro (HumaLOG) injection 2-4 Units  2-4 Units Subcutaneous Nightly PRN David Stall, MD   2 Units at 08/29/19 0103    insulin lispro (HumaLOG) injection 4 Units  4 Units Subcutaneous TID AC Levora Werden C Antonios Ostrow, MD        ketoconazole (NIZORAL) 2 % shampoo   Topical daily Alfredia Ferguson, MD        melatonin tablet 6 mg  6 mg Oral QPM David Stall, MD   6 mg at 08/30/19 2113    multivitamin, with zinc (AQUADEKS) chewable tablet  2 tablet Oral Daily David Stall, MD   2 tablet at 08/31/19 0816    mupirocin (BACTROBAN) 2 % ointment   Topical TID David Stall, MD        mycophenolate (MYFORTIC) EC tablet 540 mg  540 mg Oral BID David Stall, MD   540 mg at 08/31/19 0816    nystatin (MYCOSTATIN) oral suspension  1,000,000 Units Oral TID David Stall, MD   1,000,000 Units at 08/31/19 0816    ondansetron (ZOFRAN-ODT) disintegrating tablet 4 mg  4 mg Oral Q8H PRN David Stall, MD        pantoprazole (PROTONIX) EC tablet 40 mg  40 mg Oral Daily David Stall, MD   40 mg at 08/30/19 2122    phytonadione (vitamin K1) (AQUA-MEPHYTON) 7.5 mg in sodium chloride (NS) 0.9 % 50 mL IVPB  7.5 mg Intravenous Daily Lewie Loron, MD 173.3 mL/hr at 08/31/19 0903 7.5 mg at 08/31/19 0903    polyethylene glycol (MIRALAX) packet 17 g  17 g Oral BID PRN David Stall, MD        potassium phosphate (monobasic) (K-PHOS) tablet 500 mg  500 mg Oral TID David Stall, MD   500 mg at 08/31/19 0816    predniSONE (DELTASONE) tablet 40 mg  40 mg Oral Daily David Stall, MD   40 mg at 08/31/19 0816    rifAXIMin (XIFAXAN) tablet 550 mg  550 mg Oral BID David Stall, MD   550 mg at 08/31/19 0816    sodium chloride (NS) 0.9 % infusion   Intravenous Continuous David Stall, MD 10 mL/hr at 08/26/19 0537 1,000 mL at 08/27/19 0731    sodium chloride (NS) 0.9 % infusion  10 mL/hr Intravenous Continuous Ejiofor A Lucky Cowboy., MD 10 mL/hr at 08/30/19 1931 10 mL/hr at 08/30/19 1931    sulfamethoxazole-trimethoprim (BACTRIM) 400-80 mg tablet 80 mg of trimethoprim  1 tablet Oral Q MWF David Stall, MD   80 mg of trimethoprim at 08/31/19 0816    tacrolimus (PROGRAF) capsule 3 mg  3 mg Oral BID David Stall, MD   3 mg at 08/31/19 0830    valGANciclovir (VALCYTE) tablet 450 mg  450 mg Oral Daily David Stall, MD   450 mg at 08/30/19 1727         Objective: :  BP 117/80  - Pulse 75  - Temp 36.9 ??  C (Oral)  - Resp 22  - Ht 174.6 cm (5' 8.74)  - Wt 60.8 kg (134 lb 0.6 oz)  - SpO2 100%  - BMI 19.94 kg/m??     Physical Exam:  GEN: NAD, sitting out of bed, and playing with his laptop, jaundiced  Resp: no increased WOB  ABD: no distension  PSYCH: normal affect  Skin: scattered papules noted    Test Results    Lab Results   Component Value Date    POCGLU 219 (H) 08/30/2019    POCGLU 143 08/30/2019    POCGLU 117 08/30/2019    POCGLU 99 08/30/2019    POCGLU 184 (H) 08/29/2019    POCGLU 79 08/29/2019    POCGLU 212 (H) 08/29/2019    POCGLU 317 (H) 08/28/2019    POCGLU 160 08/28/2019    POCGLU 288 (H) 08/27/2019     Lab Results   Component Value Date    A1C 5.0 08/17/2019    A1C 4.9 10/13/2018    A1C 7.6 (H) 07/16/2018     Lab Results   Component Value Date    CREATININE 0.78 08/31/2019    NA 136 08/31/2019    K 3.6 08/31/2019    CL 104 08/31/2019    CO2 24.0 08/31/2019    ANIONGAP 8 08/31/2019    CALCIUM 7.6 (L) 08/31/2019    CALCIUM 7.6 (L) 08/31/2019    CALCIUM 7.7 (L) 08/31/2019    ALBUMIN 2.6 (L) 08/31/2019     Lab Results   Component Value Date    WBC 4.8 08/29/2019    HGB 9.8 (L) 08/29/2019    PLT 59 (L) 08/29/2019     Lab Results   Component Value Date    AST 162 (H) 08/31/2019    ALT 299 (H) 08/31/2019    ALKPHOS 857 (H) 08/31/2019     Lab Results   Component Value Date    TSH 0.797 02/11/2018    FREET4 1.49 02/11/2018     Lab Results   Component Value Date    CHOL 127 08/12/2017    TRIG 49 08/12/2017    HDL 42 08/12/2017    LDL 75 08/12/2017

## 2019-09-01 LAB — COMPREHENSIVE METABOLIC PANEL
ALBUMIN: 2.7 g/dL — ABNORMAL LOW (ref 3.5–5.0)
ALKALINE PHOSPHATASE: 814 U/L — ABNORMAL HIGH (ref 65–260)
AST (SGOT): 142 U/L — ABNORMAL HIGH (ref 19–55)
BILIRUBIN TOTAL: 35.2 mg/dL — ABNORMAL HIGH (ref 0.0–1.2)
BLOOD UREA NITROGEN: 15 mg/dL (ref 7–21)
BUN / CREAT RATIO: 16
CALCIUM: 7.7 mg/dL — ABNORMAL LOW (ref 8.5–10.2)
CHLORIDE: 110 mmol/L — ABNORMAL HIGH (ref 98–107)
CREATININE: 0.91 mg/dL (ref 0.70–1.30)
EGFR CKD-EPI AA MALE: >90 mL/min/{1.73_m2} (ref >=60)
EGFR CKD-EPI NON-AA MALE: >90 mL/min/{1.73_m2} (ref >=60)
GLUCOSE RANDOM: 214 mg/dL — ABNORMAL HIGH (ref 70–179)
POTASSIUM: 3.8 mmol/L (ref 3.5–5.0)
SODIUM: 139 mmol/L (ref 135–145)

## 2019-09-01 LAB — CBC W/ AUTO DIFF
BASOPHILS ABSOLUTE COUNT: 0 10*9/L (ref 0.0–0.1)
BASOPHILS RELATIVE PERCENT: 0.1 %
EOSINOPHILS ABSOLUTE COUNT: 0 10*9/L (ref 0.0–0.4)
EOSINOPHILS RELATIVE PERCENT: 0.1 %
HEMATOCRIT: 29.2 % — ABNORMAL LOW (ref 41.0–53.0)
LARGE UNSTAINED CELLS: 1 % (ref 0–4)
LYMPHOCYTES ABSOLUTE COUNT: 0.2 10*9/L — ABNORMAL LOW (ref 1.5–5.0)
LYMPHOCYTES RELATIVE PERCENT: 6.1 %
MEAN CORPUSCULAR HEMOGLOBIN CONC: 33.3 g/dL (ref 31.0–37.0)
MEAN CORPUSCULAR HEMOGLOBIN: 29.3 pg (ref 26.0–34.0)
MEAN CORPUSCULAR VOLUME: 87.9 fL (ref 80.0–100.0)
MEAN PLATELET VOLUME: 8 fL (ref 7.0–10.0)
MONOCYTES RELATIVE PERCENT: 5.3 %
NEUTROPHILS ABSOLUTE COUNT: 2.5 10*9/L (ref 2.0–7.5)
NEUTROPHILS RELATIVE PERCENT: 87.4 %
PLATELET COUNT: 40 10*9/L — ABNORMAL LOW (ref 150–440)
RED BLOOD CELL COUNT: 3.32 10*12/L — ABNORMAL LOW (ref 4.50–5.90)
RED CELL DISTRIBUTION WIDTH: 30.6 % — ABNORMAL HIGH (ref 12.0–15.0)
WBC ADJUSTED: 2.9 10*9/L — ABNORMAL LOW (ref 4.5–11.0)

## 2019-09-01 LAB — GAMMA GT: GAMMA GLUTAMYL TRANSFERASE: 487 U/L — ABNORMAL HIGH (ref 12–109)

## 2019-09-01 LAB — PROTIME-INR: INR: 1.55

## 2019-09-01 LAB — BILIRUBIN DIRECT: Bilirubin.glucuronidated+Bilirubin.albumin bound:MCnc:Pt:Ser/Plas:Qn:: 32.4 — ABNORMAL HIGH

## 2019-09-01 LAB — BUN / CREAT RATIO: Urea nitrogen/Creatinine:MRto:Pt:Ser/Plas:Qn:: 16

## 2019-09-01 LAB — INR: Coagulation tissue factor induced.INR:RelTime:Pt:PPP:Qn:Coag: 1.55

## 2019-09-01 LAB — MAGNESIUM: Magnesium:MCnc:Pt:Ser/Plas:Qn:: 1.6

## 2019-09-01 LAB — PHOSPHORUS: Phosphate:MCnc:Pt:Ser/Plas:Qn:: 3.3

## 2019-09-01 LAB — CRYPTOCOCCAL ANTIGEN: Lab: NEGATIVE

## 2019-09-01 LAB — GAMMA GLUTAMYL TRANSFERASE: Gamma glutamyl transferase:CCnc:Pt:Ser/Plas:Qn:: 487 — ABNORMAL HIGH

## 2019-09-01 LAB — HYPOCHROMIA

## 2019-09-01 NOTE — Unmapped (Addendum)
Pediatric Tacrolimus Therapeutic Monitoring Pharmacy Note  ??  [name]??Gross??is a 18 y.o.??male??restarting??tacrolimus.   ??  Indication:??Liver transplant??  ??  Date of Transplant: 09/22/2016??  ??  Current Dosing Information: Tacrolimus??3 mg PO BID  ??  Dosing Weight: 61.8 kg  ??  Goals:  Therapeutic Drug Levels  Tacrolimus trough goal: 8-10 ng/mL per primary team resident and fellow  ??  Additional Clinical Monitoring/Outcomes  ?? Monitor renal function (SCr and urine output) and liver function (LFTs)  ?? Monitor for signs/symptoms of adverse events (e.g., hyperglycemia, hyperkalemia, hypomagnesemia, hypertension, headache, tremor)  ??  Results: Tacrolimus Trough 6.2 ng/mL  ??  Longitudinal Dose Monitoring:  Date Dose (mg)  AM / PM AM Scr (mg/dL) Level  (ng/mL) Key Drug Interactions   08/31/19 3 mg / -- 0.78 6.2 None   08/30/19 3 mg / 3 mg 0.66 - None   08/29/19 -- / 3 mg 0.63 -- None   08/22/19 1.5 mg/ 3 mg 0.79 3.9 None   08/20/19 0.75 mg / 1.5 mg 0.98 2.7 None   08/19/19 0.75 mg / 0.75 mg 0.95 3.4 None   08/18/19 0.5 mg / 0.75 mg 1.13 2.6 None   08/17/19 0.5 mg / 0.5 mg ??1.12 3.4 None   08/16/19 0.25 mg / 0.5 mg 1.09 3.2 None   08/15/19 0.25 mg/0.5 mg 1.04 4.2 None   08/14/19 0.25 mg/0.5 mg 1.06 4.4 None   08/13/19 0.25 mg/0.25 mg 1.28 5.5 None   08/12/19 --- / 0.25 mg 1.25 5.9 None   08/11/19 0.5 mg /??--- ??1.26 ??10.1 ??None   08/10/19 1 mg / HOLD 1.4 10.5 None   08/09/19 1 mg / 1 mg 1.29 --- None   08/08/19 3 mg / HOLD 1.5 11.9 None   08/06/19 5 mg / 3 mg 0.8 9 None   ??  Pharmacokinetic Considerations and Significant Drug Interactions:  ?? Concurrent hepatotoxic medications: None identified  ?? Concurrent CYP3A4 substrates/inhibitors: None identified  ?? Concurrent nephrotoxic medications: Valganciclovir, SMP/TMX  ??  Assessment/Plan: [name] Gross's had a recent increase of his tacrolimus dose to 3mg  BID as of 10/19. Today his level was still subtherapeutic. After speaking with the team, it was decided to increase his evening dose and reassess levels in 2 days (10/23) to make sure he is trending appropriately.     Recommendedation(s)  ?? Change regimen to 3.5 mg (0.06 mg/kg) BID  ?? Obtain next tacrolimus level in 2 days.   ??  Follow-up  ?? Next level ordered: 09/02/19 at 0700.  ?? A pharmacist will continue to monitor and recommend levels as appropriate  ??  The above plan was discussed and approved by Margot Chimes, MD along with GI Fellow and medical team.    Please page service pharmacist with questions/clarifications.    Mabeline Caras, PharmD, BCPPS

## 2019-09-01 NOTE — Unmapped (Signed)
Endocrinology Consult - Follow Up Note    Requesting Attending Physician :  Arnold Long Mir, MD  Service Requesting Consult : Ped [facility]astro[facility]ology [facility]  Primary Care Provider: [name], MD  Outpatient Endocrinologist: N/A    Assessment/Recommendations:  [name] Gross is a 18 y.o. male who is admitted for Liver transplant rejection (CMS-HCC). Endocrinology have been consulted to evaluate patient for hyperglycemia.    Steroid induced hyperglycemia: A1c 4.9% but with postprandial hyperglycemia. Still on PO prednisone 40mg  daily. Recommend the following regimen:  -Continue Lantus 10 units at bedtime (give 6 units if NPO)  -Continue Lispro 4 units with meals (Hold if NPO) - ordered as 5x a day to allow for late night meal coverage  -Continue Lispro correction 1:50>150 with meals and at bedtime    The patient was discussed with attending, Dr. Aleen Campi    Primary team informed of recommendations. We will continue to follow patient as needed. Please call/page [phone] with questions or concerns.     Farrel Conners, MD  [facility] Endocrinology Fellow      History of Present Illness:     Reason for Consult: hyperglycemia.     [name] Gross is a 18 y.o. male who is admitted for liver transplant rejection (CMS-HCC). Endocrinology have been consulted to evaluate patient for hyperglycemia.    Interval history: Had liver biopsy yesterday. His BGs remain high with a 24hr BG range of 184-216. FBG today 214. He was sleepy this morning.    Current regimen: Lantus 10 units qhs, Lispro 4u TIDAC, prn lispro for late night eating 2-4u, lispro 1:50>150 q4hrs    Active Orders   Diet    Nutrition Therapy Regular/House       Review of Systems:  A 12 system review of systems was negative except as noted in HPI.      All Medications:   Current Facility-Administered Medications   Medication Dose Route Frequency Provider Last Rate Last Dose   ??? ampicillin-sulbactam dilution (UNASYN) injection 3 g  3 g Intravenous Q6H David Stall, MD   3 g at 09/01/19 0920   ??? benzoyl peroxide 5 % gel   Topical Nightly Alfredia Ferguson, MD       ??? calcium carbonate (OS-CAL) tablet 300 mg elem calcium  300 mg elem calcium Oral BID David Stall, MD   300 mg elem calcium at 09/01/19 0912   ??? clindamycin (CLEOCIN T) 1 % lotion 1 application  1 application Topical daily Alfredia Ferguson, MD   1 application at 08/29/19 1649   ??? dextrose 50 % in water (D50W) 50 % solution 12.5 g  12.5 g Intravenous Q10 Min PRN David Stall, MD       ??? ergocalciferol (DRISDOL) capsule 50,000 Units  50,000 Units Oral Weekly David Stall, MD   50,000 Units at 08/29/19 [id number]   ??? heparin preservative-free injection 10 units/mL syringe (HEPARIN LOCK FLUSH)  20 Units Intravenous Daily PRN David Stall, MD   20 Units at 08/27/19 1524   ??? influenza vaccine quad (FLUARIX, FLULAVAL, FLUZONE) (6 MOS & UP) 2020-21  0.5 mL Intramuscular During hospitalization David Stall, MD       ??? insulin glargine (LANTUS) injection 10 Units  10 Units Subcutaneous Nightly Triton Heidrich Philip Aspen, MD       ??? insulin lispro (HumaLOG) injection 0-5 Units  0-5 Units Subcutaneous 5XD insulin Ranell Finelli Philip Aspen, MD       ??? insulin lispro (HumaLOG)  injection 4 Units  4 Units Subcutaneous 5XD insulin Steffani Dionisio C Zurri Rudden, MD       ??? ketoconazole (NIZORAL) 2 % shampoo   Topical daily Alfredia Ferguson, MD       ??? melatonin tablet 6 mg  6 mg Oral QPM David Stall, MD   6 mg at 08/31/19 2142   ??? multivitamin, with zinc (AQUADEKS) chewable tablet  2 tablet Oral Daily David Stall, MD   2 tablet at 09/01/19 0913   ??? mupirocin (BACTROBAN) 2 % ointment   Topical TID David Stall, MD       ??? mycophenolate (MYFORTIC) EC tablet 540 mg  540 mg Oral BID David Stall, MD   540 mg at 09/01/19 0913   ??? nystatin (MYCOSTATIN) oral suspension  1,000,000 Units Oral TID David Stall, MD   1,000,000 Units at 09/01/19 0911   ??? ondansetron [facility]) injection 4 mg  4 mg Intravenous Once PRN Purvis Kilts, MD       ??? ondansetron (ZOFRAN-ODT) disintegrating tablet 4 mg  4 mg Oral Q8H PRN David Stall, MD       ??? pantoprazole (PROTONIX) EC tablet 40 mg  40 mg Oral Daily David Stall, MD   40 mg at 08/31/19 2141   ??? phytonadione (vitamin K1) (AQUA-MEPHYTON) 7.5 mg in sodium chloride (NS) 0.9 % 50 mL IVPB  7.5 mg Intravenous Daily Lewie Loron, MD 173.3 mL/hr at 09/01/19 1000 7.5 mg at 09/01/19 1000   ??? polyethylene glycol (MIRALAX) packet 17 g  17 g Oral BID PRN David Stall, MD       ??? potassium phosphate (monobasic) (K-PHOS) tablet 500 mg  500 mg Oral TID David Stall, MD   500 mg at 09/01/19 0900   ??? predniSONE (DELTASONE) tablet 40 mg  40 mg Oral Daily David Stall, MD   40 mg at 09/01/19 0900   ??? promethazine (PHENERGAN) 1 mg/mL pediatric dilution injection 6.25 mg  6.25 mg Intravenous Q15 Min PRN Purvis Kilts, MD       ??? rifAXIMin [facility]) tablet 550 mg  550 mg Oral BID David Stall, MD   550 mg at 09/01/19 0913   ??? sodium chloride (NS) 0.9 % infusion   Intravenous Continuous David Stall, MD 10 mL/hr at 08/26/19 0537 1,000 mL at 08/27/19 0731   ??? sodium chloride (NS) 0.9 % infusion  10 mL/hr Intravenous Continuous Ejiofor A Lucky Cowboy., MD 10 mL/hr at 08/31/19 1830 10 mL/hr at 08/31/19 1830   ??? sulfamethoxazole-trimethoprim (BACTRIM) 400-80 mg tablet 80 mg of trimethoprim  1 tablet Oral Q MWF David Stall, MD   80 mg of trimethoprim at 08/31/19 0816   ??? tacrolimus (PROGRAF) capsule 3.5 mg  3.5 mg Oral BID Alfredia Ferguson, MD   3.5 mg at 09/01/19 0900   ??? valGANciclovir (VALCYTE) tablet 450 mg  450 mg Oral Daily David Stall, MD   450 mg at 08/31/19 2137         Objective: :  BP 113/69  - Pulse 83  - Temp 36.8 ??C (Oral)  - Resp 28  - Ht 174.6 cm (5' 8.74)  - Wt 60.7 kg (133 lb 13.1 oz)  - SpO2 100%  - BMI 19.91 kg/m??     Physical Exam:  GEN: NAD, sitting out of bed, and playing with his laptop, jaundiced  Resp: no increased WOB  ABD: no distension  PSYCH: flat affect  Skin: scattered papules noted    Test Results    Lab Results   Component Value Date    POCGLU 138 09/01/2019    POCGLU 95 09/01/2019    POCGLU 216 (H) 08/31/2019    POCGLU 219 (H) 08/30/2019    POCGLU 143 08/30/2019    POCGLU 117 08/30/2019    POCGLU 99 08/30/2019    POCGLU 184 (H) 08/29/2019    POCGLU 79 08/29/2019    POCGLU 212 (H) 08/29/2019     Lab Results   Component Value Date    A1C 5.0 08/17/2019    A1C 4.9 10/13/2018    A1C 7.6 (H) 07/16/2018     Lab Results   Component Value Date    CREATININE 0.91 09/01/2019    NA 139 09/01/2019    K 3.8 09/01/2019    CL 110 (H) 09/01/2019    CO2  09/01/2019      Comment:      Icteric specimen.      ANIONGAP  09/01/2019      Comment:      Unable to calculate      CALCIUM 7.7 (L) 09/01/2019    ALBUMIN 2.7 (L) 09/01/2019     Lab Results   Component Value Date    WBC 2.9 (L) 09/01/2019    HGB 9.7 (L) 09/01/2019    PLT 40 (L) 09/01/2019     Lab Results   Component Value Date    AST 142 (H) 09/01/2019    ALT  09/01/2019      Comment:      Icteric specimen.      ALKPHOS 814 (H) 09/01/2019     Lab Results   Component Value Date    TSH 0.797 02/11/2018    FREET4 1.49 02/11/2018     Lab Results   Component Value Date    CHOL 127 08/12/2017    TRIG 49 08/12/2017    HDL 42 08/12/2017    LDL 75 08/12/2017

## 2019-09-01 NOTE — Unmapped (Signed)
[facility]   POST-PROCEDURE NOTE       Procedure Name: No admission procedures for hospital encounter.    Pre-Op Diagnosis: acute transplant rejection    Post-Op Diagnosis: Same as pre-operative diagnosis    VIR Providers    Attending: Dr. Braulio Conte  Assistant: Dr. Ronney Asters    Description of procedure: US guided liver biopsy    Sedation: General anesthesia      Estimated Blood Loss: minimal  Specimens: 2.5 cm length 18 G core.     Complications: None      See detailed procedure note with images in PACS [facility]).    The patient tolerated the procedure well without incident or complication and was returned to the Floor in stable condition.    Ronney Asters, MD  08/31/2019 6:05 PM

## 2019-09-01 NOTE — Unmapped (Signed)
Problem: Adult Inpatient Plan of Care  Goal: Plan of Care Review  Outcome: Progressing  No acute events noted. Afebrile. S/p a Liver biopsy on 08/31/19, Procedure site dsg C/D/I. Abd remains distended, soft and nontender. IV Ampicillin remains scheduled. NS at /hr into RUE PICC. HS POCT glucose resulted at 216. Insulin order were changed, refer to Orders and MAR for specifics. No c/o discomfort voiced. AML collected. Voiding and stooling, patient is not saving his stool.  Enteric Precautions maintained. Will continue to monitor.

## 2019-09-01 NOTE — Unmapped (Signed)
Pt afebrile, VSS. No c/o pain. Reports diarrhea has resolved. Pt refusing accu checks all day today so sliding scale insulin held as well, MD notified. VIR postponed d/t pending platelet counts. Finally resulted, platelets started and transferred to VIR this evening with platelets infusing. Remains in PACU post liver bx. No family present today. Will continue to monitor.

## 2019-09-01 NOTE — Unmapped (Signed)
Pediatric Daily Progress Note     Assessment/Plan:     Principal Problem:    Liver transplant rejection (CMS-HCC)  Active Problems:    Recurrent major depressive disorder, in partial remission (CMS-HCC)    Malnutrition of mild degree (CMS-HCC)    History of liver transplant (CMS-HCC)    Transaminitis    Normocytic anemia    Hypoalbuminemia    Steroid-induced hyperglycemia    Pityrosporum folliculitis    Abdominal pain    Homeless    Thrombocytopenia (CMS-HCC)    Hypomagnesemia    Hypophosphatasia    Other ascites    MSSA bacteremia    Hyperbilirubinemia    Impetigo lesions of Right Ear and Posterior Neck    Steroid-induced acne    Liver dysfunction secondary to transplant rejection     Irritant contact dermatitis of the hands    Sleep difficulties    Strep Mitis Central line-associated bloodstream infection  Resolved Problems:    Cholangitis of transplanted liver (CMS-HCC)    Melena    Dark stools    Zed??is a 18 y.o.??male??with history of??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 Along with steroids, tacrolimus, and cellcept, thyroglobulin treatment was initiated (on 10/01-10/12) but yielded limited benefit in liver studies to date. Patient has discussed prognosis with Dr. Melrose Nakayama and palliative care including the possibility of not recovering from liver rejection (10/9). Patient understands that transplant is an option but that he may not be a candidate. His main active issue currently is treating CLABSI secondary to strep mitis and MSSA (found after cultures obtained secondary to leukocytosis on daily labs) with Unasyn for a total of a 14 day course (through 10/27) and salvaging his PICC line for now given difficulty with access. Due to his infection all of his immunosuppressants (thymoblobulin, Cellcept, tacrolimus), were held, his steroids were weaned, and his repeat liver biopsy were delayed until his bloodstream infection was adequately managed.  On 10/19, his immunosuppressants were restarted, and biopsy was rescheduled. His other active issues are complications secondary to liver dysfunction (hypoalbuminemia, ascites, edema, abdominal pain, elevated INR, electrolyte abnormalities). Currently his ascites/edema is stable; we will continue to monitor closely and consider albumin/lasix as needed. He also has steroid-induced hyperglycemia, currently being managed on insulin with daily adjustments.     #Hx Liver Transplant - Subacute/Acute Liver Failure: ??Biopsy confirmed severe acute rejection (9/25), RAI 9/9. Liver labs without a significant improvement after immunosuppression. S/p 12 days of ATG and high dose MethylPrednisolone (10/1-10/12) with poor overall response to therapy. Labs 10/21 with Tbili 32.5 (33.9), Dbili 29.70 (30/.80) , GGT 518 (537) (500).  Liver biopsy completed on 10/21.  - F/u liver biopsy path result  -Tacro and Mycophenolate restarted 10/20    -10/21 Tac trough 6.2 (goal 8-10) --> increase dose to 3.5 mg BID   -Appreciate pharmacy assistance  - Continue prednisone 40mg  today  - Continue Valgancyclovir 450mg  daily (3months), Bactrim MWF (82mo) for prophylaxis (10/2-) and Nystatin 10ml TID (65mo) prophylaxis (10/2-)  - Vitamin K IV daily without change. Can discuss switching to PO with Dr. Melrose Nakayama    -F/u Des-gamma-carboxy-prothrombin- if pt low in vit K, should result as elevated  - Protonix 40 mg PO daily  - Continue Rifaximin (given elevated ammonia, no encephalopathy)   - PT/INR, CBC, CMP on M/Th   - 10/21 liver transplant committee determined patient should proceed with  complete liver transplant evaluation    # Elevated Creatinine: Cr elevated to 0.91 (0.78, 0.66) in that coincides with restarting and increasing the dose of tacrolimus, likely causing nephrotoxicity. Patient is making great urine, with UOP on 2.65 L, and BUN of 15, so prerenal etiology is less likely.   - F/u BMP 10/23 am    # Acute Diarrhea: Patient endorsed 3x episodes of diarrhea on 10/20 overnight which has now resolved.  Given patient is immunocompromised will complete a thorough work-up. Cdiff negative.   - F/u GIPP, stool ova and parasite, CMV    #CLABSI: Strep mitis and MSSA   Developed sudden leukocytosis with white count elevated to 17.3 10/12, afebrile, non-toxic appearing, without localizing symptoms. Found to have positive Bcx 10/12 growing strep mitis and MSSA. Given ascites and risk for SBP secondary to strep mitis, also covering for anaerobes. Echo negative for endocarditis. Given patient had low WBC to 2.9 on 10/22, will repeat BCx.  - F/u 10/22 BCx peripheral and central  - CBCd M/Th  - 10/12 peripheral/PICC Cx positive for Strep mitis   - 1/012 peripheral - +MSAA  - f/u Bcx:   - 10/12 - NGTD   - 10/13 - NGTD   - 10/14 - NGTD  - Per ID recs:   - Unasyn x 14 days total (10/13-10/27)   - Given unreliable PIV access, will attempt to salvage his PICC while treating his infection   - Draw new bcx if any repeat cultures turn positive     #Edema, ascites, abdominal pain secondary to liver dysfunction: Ascites still present, stable.   - Holding IVFs, continue to encourage good PO intake  - Following daily weights  - STRICT I/Os      #Hypoalbuminemia: Albumin 2.7 (2.6)  - Likely contributing to ascites/edema  - Can consider albumin     #Nutriton: severe protein-calorie malnutrition. Prior to admission, patient has been skipping meals due to poor appetite and food insecurity. Patient states he has good appetite.  - Continue goal PO fluid intake of 1.6L   - Regular diet +  Ensure supplements PRN  - Ergocalciferol qMon   - Aquadex multivitamin    - Ca 300 mg BID    #Electrolyte abnormalities: hypomagnesemia, hypophosphatemia: Mg 1.6 (1.5), 3.3 (2.4)  - Daily CMP, Mg, Phos  - Urine labs--K 9.1, Phos 23.2 , Mag 3.1,Cr 29.6    -PTH initially read as elevated to 263.1 but now showing invalid result   -F/u PTHrP, Vit D  - Hypomagnesemia   - mag sulphate IV prn (10/15, 10/17, 10/21)   - given episode of diarrhea, avoid PO magnesium  - Hypophosphatemia    - k-phos TID (each tablet has 16 mmol phos, 2 mmol K)    #Constipation: currently recovering from brief episodes of diarrhea, so has not needed miralax  - Miralax 17 g BID PRN     #Steroid induced hyperglycemia:??A1c??4.9% but with postprandial hyperglycemia. Still on PO prednisone 40mg  daily.   -Increase to Lantus 10 units at bedtime (give 6 units if NPO)  -Restart nutritional insulin when eating: Lispro 4 units with meals (Hold if NPO)  -Continue PRN Lispro dose for overnight meal: 4 units for normal-large meal, 2 units for smaller meal  -Continue Lispro correction 1:50>150 ACHS  -Endocrine following, appreciate    #Thrombocytopenia: Plts low at 41 prior to liver bx 10/22 s/p 1 unit platelets. Repeat plts 10/22 40.  - s/p plt transfusion on 10/13, 10/15, 10/21 for PLT of 50k     #  Impetigo Skin Lesions on Right Ear and Left posterior Neck, + MSSA, healing well   - Mupirocin ointment for both lesions until healed, per derm    #Steroid-induced acne, possible fungal folliculitis   - topical clindamycin, benzoyl peroxide, Ketoconazole shampoo    ??  #Major Depressive Disorder, insomnia: Stable. Has hx of attempted overdoses, with the last attempt in April 2019??at which point he was hospitalized in the Adolescent Psychiatry unit and discharged on Lexapro 20 mg. ??He has not engaged with mental health resources or taken medication for depression or insomnia in 8-10 months.????Denies SI, passive suicidality, self-harm, or HI at this time.    - Patient saw adult psychiatry team again on 10/22. Offered SSRI, but patient not interested at this time  - Melatonin 3 mg q1800 for sleep and circadian rhythm regulation    #Deconditioning. Patient endorses feeling weak and having limited mobility.   -PT following -Encourage OOB and ambulation    #Homelessness - Social Concerns:??Ja has experienced homelessness for ~5 months when he left his mother's house. ??He has severed all ties with his family and states his relationship with his mother cannot be reconciled. ??He also requested contact information of family members to be erased from his chart (this has been completed, but social worker did drop contact info into her note on 9/24) and he more recently has reconciled with some family members. ??In addition to food insecurity, he has not been able to secure medications. ??He is routinely exposed to alcohol and drugs (marijuana, cocaine, etc.) second-hand through his friendships though he denies current use himself. ??On urine drug screen, he tested positive for marijuana only.  -Supportive care following.  -Designated a personal friend [name]) and grandfather to be his HCPOAs (Friend is the Primary).   -Spoke with sister on 10/16 (has not been in regular contact)   -Paternal grandmother now visiting regularly   ??  Access: PICC (placed 9/28), will need removal if he becomes unstable in the setting of CLABSI infection   ??  Discharge criteria:??liver transplant work-up and treatment of acute rejection    Subjective:  patient states had 2x BM overnight and no longer has diarrhea. Patient doing well after liver biopsy yesterday with no abdominal pain. Patient ate mcdonalds last night after liver biopsy completed.    Objective:     Vital signs in last 24 hours:  Temp:  [36.1 ??C-37.1 ??C] 37 ??C  Heart Rate:  [63-97] 82  SpO2 Pulse:  [60-88] 80  Resp:  [19-32] 26  BP: (107-120)/(66-85) 111/69  MAP (mmHg):  [79-90] 81  SpO2:  [100 %] 100 %  Intake/Output last 3 shifts:  I/O last 3 completed shifts:  In: 2084 [P.O.:118; I.V.:1966]  Out: 2850 [Urine:2850]    Physical Exam:   General:  laying in bed, awake. Thin appearing. Interactive and conversational   Head: normocephalic, atraumatic.  Eyes: EOMI. Marked scleral icterus present. Nose:   clear, no discharge  Lungs: clear breath sounds bilaterally. No increased work of breathing. No wheezes/crackles. Good aeration at the bases.   Heart: regular rate and rhythm. No murmurs appreciated on auscultation.  Abdomen:  Abdomen protuberant. Ascites present. No HSM.   Neuro: deconditioned, moves all extremities with decreased strength.   Extremities: non-pitting edema in the LE, WWP  Skin: Skin lesions on left neck, healing well. Skin lesion present on right ear with scabbed. Acneform rash present on face, back, chest.     Active Medications reviewed and KEY Medications include:  Current Facility-Administered Medications:   ???  ampicillin-sulbactam dilution (UNASYN) injection 3 g, 3 g, Intravenous, Q6H, Darden Dates Gutierrez-Wu, MD, 3 g at 09/01/19 0920  ???  benzoyl peroxide 5 % gel, , Topical, Nightly, Alfredia Ferguson, MD  ???  calcium carbonate (OS-CAL) tablet 300 mg elem calcium, 300 mg elem calcium, Oral, BID, David Stall, MD, 300 mg elem calcium at 09/01/19 0912  ???  clindamycin (CLEOCIN T) 1 % lotion 1 application, 1 application, Topical, daily, Alfredia Ferguson, MD, 1 application at 08/29/19 1649  ???  dextrose 50 % in water (D50W) 50 % solution 12.5 g, 12.5 g, Intravenous, Q10 Min PRN, David Stall, MD  ???  ergocalciferol (DRISDOL) capsule 50,000 Units, 50,000 Units, Oral, Weekly, David Stall, MD, 50,000 Units at 08/29/19 [phone]  ???  heparin preservative-free injection 10 units/mL syringe (HEPARIN LOCK FLUSH), 20 Units, Intravenous, Daily PRN, David Stall, MD, 20 Units at 08/27/19 1524  ???  influenza vaccine quad (FLUARIX, FLULAVAL, FLUZONE) (6 MOS & UP) 2020-21, 0.5 mL, Intramuscular, During hospitalization, David Stall, MD  ???  insulin glargine (LANTUS) injection 10 Units, 10 Units, Subcutaneous, Nightly, Chinelo Philip Aspen, MD  ???  insulin lispro (HumaLOG) injection 0-5 Units, 0-5 Units, Subcutaneous, 5XD insulin, Chinelo Philip Aspen, MD  ???  insulin lispro (HumaLOG) injection 4 Units, 4 Units, Subcutaneous, 5XD insulin, Chinelo C Okigbo, MD, 4 Units at 09/01/19 1419  ???  ketoconazole (NIZORAL) 2 % shampoo, , Topical, daily, Alfredia Ferguson, MD  ???  melatonin tablet 6 mg, 6 mg, Oral, QPM, David Stall, MD, 6 mg at 08/31/19 2142  ???  multivitamin, with zinc (AQUADEKS) chewable tablet, 2 tablet, Oral, Daily, David Stall, MD, 2 tablet at 09/01/19 0913  ???  mupirocin (BACTROBAN) 2 % ointment, , Topical, TID, David Stall, MD  ???  mycophenolate (MYFORTIC) EC tablet 540 mg, 540 mg, Oral, BID, David Stall, MD, 540 mg at 09/01/19 0913  ???  nystatin (MYCOSTATIN) oral suspension, 1,000,000 Units, Oral, TID, David Stall, MD, 1,000,000 Units at 09/01/19 1419  ???  ondansetron (ZOFRAN) injection 4 mg, 4 mg, Intravenous, Once PRN, Purvis Kilts, MD  ???  ondansetron (ZOFRAN-ODT) disintegrating tablet 4 mg, 4 mg, Oral, Q8H PRN, David Stall, MD  ???  pantoprazole (PROTONIX) EC tablet 40 mg, 40 mg, Oral, Daily, David Stall, MD, 40 mg at 08/31/19 2141  ???  phytonadione (vitamin K1) (AQUA-MEPHYTON) 7.5 mg in sodium chloride (NS) 0.9 % 50 mL IVPB, 7.5 mg, Intravenous, Daily, Lewie Loron, MD, Last Rate: 173.3 mL/hr at 09/01/19 1000, 7.5 mg at 09/01/19 1000  ???  polyethylene glycol (MIRALAX) packet 17 g, 17 g, Oral, BID PRN, David Stall, MD  ???  potassium phosphate (monobasic) (K-PHOS) tablet 500 mg, 500 mg, Oral, TID, David Stall, MD, 500 mg at 09/01/19 1400  ???  predniSONE (DELTASONE) tablet 40 mg, 40 mg, Oral, Daily, David Stall, MD, 40 mg at 09/01/19 0900  ???  promethazine (PHENERGAN) 1 mg/mL pediatric dilution injection 6.25 mg, 6.25 mg, Intravenous, Q15 Min PRN, Purvis Kilts, MD  ???  rifAXIMin Burman Blacksmith) tablet 550 mg, 550 mg, Oral, BID, David Stall, MD, 550 mg at 09/01/19 0913  ???  sodium chloride (NS) 0.9 % infusion, , Intravenous, Continuous, David Stall, MD, Last Rate: 10 mL/hr at 08/26/19 0537, 1,000 mL at 08/27/19 0731  ???  sodium chloride (NS) 0.9 % infusion, 10 mL/hr, Intravenous, Continuous,  Ejiofor A Lucky Cowboy., MD, Last Rate: 10 mL/hr at 08/31/19 1830, 10 mL/hr at 08/31/19 1830  ???  sulfamethoxazole-trimethoprim (BACTRIM) 400-80 mg tablet 80 mg of trimethoprim, 1 tablet, Oral, Q MWF, David Stall, MD, 80 mg of trimethoprim at 08/31/19 0816  ???  tacrolimus (PROGRAF) capsule 3.5 mg, 3.5 mg, Oral, BID, Alfredia Ferguson, MD, 3.5 mg at 09/01/19 0900  ???  valGANciclovir (VALCYTE) tablet 450 mg, 450 mg, Oral, Daily, David Stall, MD, 450 mg at 08/31/19 2137        Studies: Personally reviewed and interpreted.  Labs/Studies:  Labs and Studies from the last 24hrs per EMR and Reviewed and     Lab Results   Component Value Date    WBC 2.9 (L) 09/01/2019    HGB 9.7 (L) 09/01/2019    HCT 29.2 (L) 09/01/2019    PLT 40 (L) 09/01/2019       Lab Results   Component Value Date    NA 139 09/01/2019    K 3.8 09/01/2019    CL 110 (H) 09/01/2019    CO2  09/01/2019      Comment:      Icteric specimen.      BUN 15 09/01/2019    CREATININE 0.91 09/01/2019    GLU 214 (H) 09/01/2019    CALCIUM 7.7 (L) 09/01/2019    MG 1.6 09/01/2019    PHOS 3.3 09/01/2019     Lab Results   Component Value Date    BILITOT 35.2 (H) 09/01/2019    BILIDIR 32.40 (H) 09/01/2019    PROT  09/01/2019      Comment:      Icteric specimen.      ALBUMIN 2.7 (L) 09/01/2019    ALT  09/01/2019      Comment:      Icteric specimen.      AST 142 (H) 09/01/2019    ALKPHOS 814 (H) 09/01/2019    GGT 487 (H) 09/01/2019     Lab Results   Component Value Date    PT 18.0 (H) 09/01/2019    INR 1.55 09/01/2019    APTT 25.0 (L) 08/14/2019     ========================================  Jonna Clark, MD  Anesthesiology - PGY-1  Pager: [phone]  I??supervised the resident physician who spent 35 minutes on the floor or unit in direct patient care. The direct patient care time included face-to-face time with the patient, reviewing the patient's chart, communicating with the family and/or other professionals and coordinating care. Greater than 50% of this time was spent in counseling or coordinating care with the patient regarding diagnosis/diagnostic considerations, planned testing, relevant test results, and treatment recommendations. I was available throughout care provided  [employer]

## 2019-09-01 NOTE — Unmapped (Signed)
[facility]  Follow-Up Psychiatry Consult Note     Service Date: September 01, 2019  LOS:  LOS: 28 days      Assessment:   [name] Gross is a 18 y.o. male with pertinent past medical and psychiatric diagnoses of primary sclerosing cholangitis s/p liver transplant in 2017 with multiple threats of rejection, and depression, anxiety, and suicide attempt (x2) admitted 08/04/2019  3:39 AM for black stools, abdominal pain, and icteric sclera most likely due to acute liver transplant rejection/failure.  Patient was seen in consultation by Psychiatry at the request of Melba Coon, MD with [facility]) for evaluation of Depression. Today he is being seen in follow-up at patient request.     The patient's current presentation of depressed mood, poor sleep, poor appetite, feelings of isolation, and nightmares is most consistent with a known diagnosis of majory depressive disorder and likely exacerbated by current primary medical illness. Patient has a history of intentional overdose on fluoxetine in the past, then more recently on tamiflu, prednisone, ibuprofen, and vitamin D. He was hospitalized on the [facility] for the latter attempt, and discharged 03/03/18 on Lexapro 20 mg, melatonin 9 mg for depression and insomnia. He reports mental health care has been unhelpful in the past and he is not interested in medication for depression or therapy- he has seen Psychology previously as inpatient. He has not engaged with mental health resources or taken medication for depression or insomnia in 8-10 months.     Today, he reports having found some benefit from speaking with psychologist on Monday, though is unsure if he would like to pursue therapy following discharge. He continues to decline medication, stating that he does not see a reason to be on medications. We strongly feel that patient will benefit from mental health follow-up at time of discharge, and as such recommend offering mental health resources to patient (as detailed below).    Diagnoses:   Active Hospital problems:  Principal Problem:    Liver transplant rejection (CMS-HCC)  Active Problems:    Recurrent major depressive disorder, in partial remission (CMS-HCC)    Malnutrition of mild degree (CMS-HCC)    History of liver transplant (CMS-HCC)    Transaminitis    Normocytic anemia    Hypoalbuminemia    Steroid-induced hyperglycemia    Pityrosporum folliculitis    Abdominal pain    Homeless    Thrombocytopenia (CMS-HCC)    Hypomagnesemia    Hypophosphatasia    Other ascites    MSSA bacteremia    Hyperbilirubinemia    Impetigo lesions of Right Ear and Posterior Neck    Steroid-induced acne    Liver dysfunction secondary to transplant rejection     Irritant contact dermatitis of the hands    Sleep difficulties    Strep Mitis Central line-associated bloodstream infection     Problems edited/added by me:  No problems updated.    Safety Risk Assessment:  A full risk assessment was previously performed on 9/24.  Risk assessment remains essentially unchanged. Patient remains not at acutely elevated risk.     Recommendations:   ## Safety:   -- No acute safey concerns.  Please see safety assessment for further discussion.  -- If pt attempts to leave against medical advice and it is felt to be unsafe for them to leave, please call a Behavioral Response and page Psychiatry at [phone].    ## Medications:   -- Defer initiation of SSRI or other antidepressant as patient  is not interested at this time  -- Melatonin 3 mg po q1800 for sleep and circadian rhythm regulation.    ## Therapeutic Support:   -- Met with psychology on 10/19  -- Recommend pastoral care    ## Medical Decision Making Capacity:   -- A formal capacity assessement was not performed as a part of this evaluation.  If specific capacity questions arise, please contact our team as below.     ## Further Work-up:   -- No recommendations at this time.    ## Disposition:   -- There are no psychiatric contraindications to discharging this patient when medically appropriate.  -- We will continue to encourage patient to consider outpatient mental health care   -- [facility] psychiatry transplant clinic is happy to see patient if needed for future transplant evaluation   Please provide patient with following information in AVS:    How to find Mental Health Treatment in [facility]    Suicide & Crisis Hotlines  ?? The Hopeline 867-646-3901  ?? Online chat: www.hopeline.com  ?? National Hopeline Network: 1-800-SUICIDE / 602-353-4234  ?? National Suicide Prevention Lifeline 1-800-273-TALK / 6164847594  ?? Online chat: www.suicidepreventionlifeline.org    ?? Ages 15-24 online chat: [email or web address]      [facility] DEPARTMENT OF PSYCHIATRY: [email or web address]  ?? [facility]: [facility] ([facility]) is a general adult psychiatric clinic staffed by teams of faculty and resident psychiatrists. For appointments, please call [phone].    ?? [facility] ??? [facility] Child & Adolescent Psychiatry:  To obtain an appointment for your child to be seen in our clinic, please call [phone].    ?? [facility] ??? [facility] Child [facility]ry Out[facility], [facility] Pediatric Behavior [facility], [facility] and [facility]: To obtain an appointment at one of these clinics, please call [phone].    ?? [facility] for Eating Disorders:  [phone]    ?? [facility] for Women???s Mood Disorders:  [phone]    How to Find Mental Health Treatment  If you have health insurance, look on your insurance card for the phone number or website to access a list of mental health providers who accept your insurance.    If you have Medicaid, Medicare or no insurance, see below for your county's MCO. Call the access number to schedule an appointment.     For the most up-to-date information, please visit: [email or web address]    [facility]  [place]  [address]  Alcide Clever  [place] 24/7 Access:  [phone]    [email or web address]     The following are some links to help you find a local therapist.    Psychology Today: An up-to-date source of local providers, searchable by client issue, type of therapy, and insurances covered.  You can also find psychiatrists for medication management in addition to psychotherapy.    [email or web address].psychologytoday.com     --------------------------------------    ## Behavioral / Environmental:   -- No specific recommendations at this time.    Thank you for this consult request. Recommendations have been communicated to the primary team.  We will sign off at this time. Please page [phone] or [phone] (after hours)  for any questions or concerns.     I saw the patient in person or via face-to-face video conference.    Discussed with and seen by Fellow, Imagene Sheller, MD.  Discussed with and seen by Attending, Maralyn Sago  Ginnifer Creelman, MD, who agrees with the assessment and plan.    Sloan Leiter, MD   I saw and evaluated the patient, participating in the key portions of the service.  I reviewed and edited the resident???s note.  I agree with the resident???s findings and plan.  If pt interested in mental health care in future, would be happy to see in our [facility] psychiatry outpt clinic (send an inbasket message to me with pts info and I will help in arranging appt).     Elray Buba, MD        Interval History:     Updates to hospital course since last seen by psychiatry: Pt has been seen by palliative care and SW to discuss care planning and his desire for autonomy vs need for supportive caregivers. Liver biopsy planned for 10/13, but cancelled due to bacteremia noted overnight. Stopped immunosuppression to start broad spectrum IV antibiotics.Met with therapist on 10/19.    Patient Interview: The patient was seen in person by the resident.and later by attending and fellow.    Patient reports mood is great, and that meeting with therapist on Monday was beneficial. States that he is unsure if he would like to continue therapy upon discharge, and declines initiation of standing psychiatric medications. Oriented x 4, and able to spell WORLD forwards and backwards. Feels safe in the hospital. Denies SI/HI/AVH.    Relevant Updates to past psychiatric, medical/surgical, family, or social history: N/A    Collateral:   - Reviewed medical records in Epic  - Primary team: Discussion with transplant that he may not be a candidate for re-transplant in the future. Has been considering surrogate decision makers.     ROS:   All systems reviewed as negative/unremarkable aside from the following pertinent positives and negatives: no acute pain or discomfort, fair appetite.     Current Medications:  Scheduled Meds:  ??? ampicillin-sulbactam  3 g Intravenous Q6H   ??? benzoyl peroxide   Topical Nightly   ??? calcium carbonate  300 mg elem calcium Oral BID   ??? clindamycin  1 application Topical daily   ??? ergocalciferol  50,000 Units Oral Weekly   ??? flu vacc [id number] 6mos up(PF)  0.5 mL Intramuscular During hospitalization   ??? insulin glargine  12 Units Subcutaneous Nightly   ??? insulin lispro  0-5 Units Subcutaneous Q4H   ??? insulin lispro  4 Units Subcutaneous TID AC   ??? ketoconazole   Topical daily   ??? melatonin  6 mg Oral QPM   ??? multivitamin, with zinc  2 tablet Oral Daily   ??? mupirocin   Topical TID   ??? mycophenolate  540 mg Oral BID   ??? nystatin  1,000,000 Units Oral TID   ??? pantoprazole  40 mg Oral Daily   ??? phytonadione (AQUA-MEPHYTON) intravenous  7.5 mg Intravenous Daily   ??? potassium phosphate (monobasic)  500 mg Oral TID   ??? predniSONE  40 mg Oral Daily   ??? rifAXIMin  550 mg Oral BID   ??? sulfamethoxazole-trimethoprim  1 tablet Oral Q MWF   ??? tacrolimus  3.5 mg Oral BID   ??? valGANciclovir  450 mg Oral Daily     Continuous Infusions:  ??? sodium chloride 500 mL (08/26/19 0537)   ??? sodium chloride 10 mL/hr (08/31/19 1830)     PRN Meds:.dextrose 50 % in water (D50W), heparin, porcine (PF), insulin lispro, ondansetron, ondansetron, polyethylen glycol, promethazine      Objective:  Vital signs:   Temp:  [36.1 ??C-37.2 ??C] 36.9 ??C  Heart Rate:  [63-112] 97  SpO2 Pulse:  [60-112] 76  Resp:  [17-32] 22  BP: (100-125)/(63-85) 120/75  MAP (mmHg):  [76-91] 86  SpO2:  [100 %] 100 %    Physical Exam:  Gen: No acute distress.  Pulm: Normal work of breathing.  Neuro/MSK: Reduced muscle bulk and tone, thin appearing, gait deferred.  Skin: not assessed.     Mental Status Exam:  Appearance:  appears stated age, well-developed and sitting in chair and eating breakfast   Attitude:   calm, cooperative and polite   Behavior/Psychomotor:  appropriate eye contact and no abnormal movements   Speech/Language:   normal rate, volume, tone, fluency and language intact, well formed   Mood:  ???good???   Affect:  constricted, flat   Thought process:  logical, linear, clear, coherent, goal directed   Thought content:    Does not endorse thoughts of self-harm, SI, plans, or intent. Denies HI.  No grandiose, self-referential, persecutory, or paranoid delusions noted.   Perceptual disturbances:   Does not endorse auditory and visual hallucinations and behavior not concerning for response to internal stimuli   Attention:  able to fully attend without fluctuations in consciousness, spells WORLD forwards and backwards   Concentration:  Able to fully concentrate and attend   Orientation:  grossly oriented.   Memory:  not formally tested, but grossly intact   Fund of knowledge:   not formally assessed   Insight:    Impaired   Judgment:   Impaired   Impulse Control:  Fair     Data Reviewed:  I reviewed labs from the last 24 hours.  I obtained history from the collateral sources noted above in the history.    Additional Psychometric Testing:  Not applicable.

## 2019-09-02 DIAGNOSIS — T8641 Liver transplant rejection: Principal | ICD-10-CM

## 2019-09-02 LAB — BASIC METABOLIC PANEL
BLOOD UREA NITROGEN: 15 mg/dL (ref 7–21)
BUN / CREAT RATIO: 19
CALCIUM: 8 mg/dL — ABNORMAL LOW (ref 8.5–10.2)
CHLORIDE: 107 mmol/L (ref 98–107)
CREATININE: 0.78 mg/dL (ref 0.70–1.30)
EGFR CKD-EPI AA MALE: >90 mL/min/{1.73_m2} (ref >=60)
EGFR CKD-EPI NON-AA MALE: >90 mL/min/{1.73_m2} (ref >=60)
GLUCOSE RANDOM: 135 mg/dL (ref 70–179)
POTASSIUM: 3.9 mmol/L (ref 3.5–5.0)
SODIUM: 136 mmol/L (ref 135–145)

## 2019-09-02 LAB — CO2: Carbon dioxide:SCnc:Pt:Ser/Plas:Qn:: 0

## 2019-09-02 LAB — C-REACTIVE PROTEIN: C reactive protein:MCnc:Pt:Ser/Plas:Qn:: 0

## 2019-09-02 LAB — PHOSPHORUS: Phosphate:MCnc:Pt:Ser/Plas:Qn:: 2.8 — ABNORMAL LOW

## 2019-09-02 LAB — TACROLIMUS, TROUGH: Lab: 7.5

## 2019-09-02 LAB — BILIRUBIN DIRECT: Bilirubin.glucuronidated+Bilirubin.albumin bound:MCnc:Pt:Ser/Plas:Qn:: 31.2 — ABNORMAL HIGH

## 2019-09-02 LAB — GAMMA GLUTAMYL TRANSFERASE: Gamma glutamyl transferase:CCnc:Pt:Ser/Plas:Qn:: 481 — ABNORMAL HIGH

## 2019-09-02 LAB — MAGNESIUM: Magnesium:MCnc:Pt:Ser/Plas:Qn:: 1.2 — ABNORMAL LOW

## 2019-09-02 NOTE — Unmapped (Signed)
Pediatric Tacrolimus Therapeutic Monitoring Pharmacy Note  ??  [name]??Gross??is a 18 y.o.??male??restarting??tacrolimus.   ??  Indication:??Liver transplant??  ??  Date of Transplant: 09/22/2016??  ??  Current Dosing Information: Tacrolimus??3 mg PO BID  ??  Dosing Weight: 61.8 kg  ??  Goals:  Therapeutic Drug Levels  Tacrolimus trough goal: 8-10 ng/mL per primary team resident and fellow  ??  Additional Clinical Monitoring/Outcomes  ?? Monitor renal function (SCr and urine output) and liver function (LFTs)  ?? Monitor for signs/symptoms of adverse events (e.g., hyperglycemia, hyperkalemia, hypomagnesemia, hypertension, headache, tremor)  ??  Results: Tacrolimus Trough = 7.5 ng/mL  ??  Longitudinal Dose Monitoring:  Date Dose (mg)  AM / PM AM Scr (mg/dL) Level  (ng/mL) Key Drug Interactions   09/02/19 3.5 mg /  0.78 7.5 None   09/01/19 3.5 mg / 3.5 mg 0.91 - None   08/31/19 3 mg / 3.5 mg 0.78 6.2 None   08/30/19 3 mg / 3 mg 0.66 - None   08/29/19 -- / 3 mg 0.63 - None   08/22/19 1.5 mg/ 3 mg 0.79 3.9 None   08/20/19 0.75 mg / 1.5 mg 0.98 2.7 None   08/19/19 0.75 mg / 0.75 mg 0.95 3.4 None   08/18/19 0.5 mg / 0.75 mg 1.13 2.6 None   08/17/19 0.5 mg / 0.5 mg ??1.12 3.4 None   08/16/19 0.25 mg / 0.5 mg 1.09 3.2 None   08/15/19 0.25 mg/0.5 mg 1.04 4.2 None   08/14/19 0.25 mg/0.5 mg 1.06 4.4 None   08/13/19 0.25 mg/0.25 mg 1.28 5.5 None   08/12/19 --- / 0.25 mg 1.25 5.9 None   08/11/19 0.5 mg /??--- ??1.26 ??10.1 ??None   08/10/19 1 mg / HOLD 1.4 10.5 None   08/09/19 1 mg / 1 mg 1.29 --- None   08/08/19 3 mg / HOLD 1.5 11.9 None   08/06/19 5 mg / 3 mg 0.8 9 None   ??  Pharmacokinetic Considerations and Significant Drug Interactions:  ?? Concurrent hepatotoxic medications: None identified  ?? Concurrent CYP3A4 substrates/inhibitors: None identified  ?? Concurrent nephrotoxic medications: Valganciclovir, SMP/TMX  ??  Assessment/Plan:   ?? Trough level subtherapeutic, Serum creatinine normalized after adequate PO fluid intake encouraged, U/O stable ??  Recommendedation(s)  ?? Increase tacrolimus dosing to 4 mg BID  ?? Obtain next tacrolimus level in 2 days.   ??  Follow-up  ?? Next level ordered: 09/04/19 at 0730.  ?? A pharmacist will continue to monitor and recommend levels as appropriate  ??  The above plan was discussed and approved by the GI Fellow and medical team.  ??  Please page service pharmacist with questions/clarifications.  ??  Mabeline Caras, PharmD, BCPPS

## 2019-09-02 NOTE — Unmapped (Signed)
Pediatric Daily Progress Note     Assessment/Plan:     Principal Problem:    Liver transplant rejection (CMS-HCC)  Active Problems:    Recurrent major depressive disorder, in partial remission (CMS-HCC)    Malnutrition of mild degree (CMS-HCC)    History of liver transplant (CMS-HCC)    Transaminitis    Normocytic anemia    Hypoalbuminemia    Steroid-induced hyperglycemia    Pityrosporum folliculitis    Abdominal pain    Homeless    Thrombocytopenia (CMS-HCC)    Hypomagnesemia    Hypophosphatasia    Other ascites    MSSA bacteremia    Hyperbilirubinemia    Impetigo lesions of Right Ear and Posterior Neck    Steroid-induced acne    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 Along with steroids, tacrolimus, and cellcept, thyroglobulin treatment was initiated (on 10/01-10/12) but yielded limited benefit in liver studies to date. Patient has discussed prognosis with Dr. Melrose Nakayama and palliative care including the possibility of not recovering from liver rejection (10/9). Patient understands that transplant is an option but that he may not be a candidate. His main active issue currently is treating CLABSI secondary to strep mitis and MSSA (found after cultures obtained secondary to leukocytosis on daily labs) with Unasyn for a total of a 14 day course (through 10/27) and salvaging his PICC line for now given difficulty with access. Due to his infection all of his immunosuppressants (thymoblobulin, Cellcept, tacrolimus), were held, his steroids were weaned, and his repeat liver biopsy were delayed until his bloodstream infection was adequately managed.  On 10/19, his immunosuppressants were restarted, and biopsy was rescheduled. His other active issues are complications secondary to liver dysfunction (hypoalbuminemia, ascites, edema, abdominal pain, elevated INR, electrolyte abnormalities). Currently his ascites/edema is stable; we will continue to monitor closely and consider albumin/lasix as needed. He also has steroid-induced hyperglycemia, currently being managed on insulin with daily adjustments.     #Hx Liver Transplant - Subacute/Acute Liver Failure: ??Biopsy confirmed severe acute rejection (9/25), RAI 9/9. Liver labs without a significant improvement after immunosuppression. S/p 12 days of ATG and high dose MethylPrednisolone (10/1-10/12) with poor overall response to therapy. Labs 10/21 with Tbili 32.5 (33.9), Dbili 29.70 (30/.80) , GGT 518 (537) (500).  Liver biopsy completed on 10/21.  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   - Indeterminate for acute cellular rejection, Compared to previous bx (08/05/2019), lymphoplasmacytic infiltrate markedly decreased  -Tacro and Mycophenolate restarted 10/20    -10/23 Tac trough 7.5 (goal 8-10) --> increase to 4 mg BID   -Appreciate pharmacy assistance   -Next tac trough 10/25  - Continue prednisone 40mg  today  - Continue Valgancyclovir 450mg  daily (3months), Bactrim MWF (15mo) for prophylaxis (10/2-) and Nystatin  10ml TID (15mo) prophylaxis (10/2-)  - Vitamin K IV daily without change. Can discuss switching to PO with Dr. Melrose Nakayama -F/u Des-gamma-carboxy-prothrombin- if pt low in vit K, should result as elevated  - Protonix 40 mg PO daily  - Continue Rifaximin (given elevated ammonia, no encephalopathy)   - PT/INR, CBC, CMP on M/Th   - 10/21 liver transplant committee determined patient should proceed with complete liver transplant evaluation    # Elevated Creatinine: Cr elevated to 0.91 (0.78, 0.66) in that coincides with restarting and increasing the dose of tacrolimus, likely causing nephrotoxicity. Patient is making great urine. Cr downtrended to 0.78 today.     # Acute Diarrhea: Patient endorsed 3x episodes of diarrhea on 10/20 overnight which has now resolved.  Given patient is immunocompromised will complete a thorough work-up. Cdiff negative. GIPP negative. Crypto ag negative. Stool ova and parasite negative   - F/u CMV  - D/C enteric precautions    #CLABSI: Strep mitis and MSSA   Developed sudden leukocytosis with white count elevated to 17.3 10/12, afebrile, non-toxic appearing, without localizing symptoms. Found to have positive Bcx 10/12 growing strep mitis and MSSA. Given ascites and risk for SBP secondary to strep mitis, also covering for anaerobes. Echo negative for endocarditis. Given patient had low WBC to 2.9 on 10/22, will repeat BCx.  - F/u 10/22 BCx NG at 24 hours  - CBC M/Th  - 10/12 peripheral/PICC Cx positive for Strep mitis   - 1/012 peripheral - +MSAA  - f/u Bcx:   - 10/12 - NGTD   - 10/13 - NGTD   - 10/14 - NGTD   - 10/22-  NGTD  - Per ID recs:   - Unasyn x 14 days total (10/13-10/27)   - Given unreliable PIV access, will attempt to salvage his PICC while treating his infection   - Draw new bcx if any repeat cultures turn positive     #Edema, ascites, abdominal pain secondary to liver dysfunction: Ascites still present, stable.   - Holding IVFs, continue to encourage good PO intake  - Following daily weights  - STRICT I/Os      #Hypoalbuminemia: Albumin 2.7 (2.6)  - Likely contributing to ascites/edema  - Can consider albumin     #Nutriton: severe protein-calorie malnutrition. Prior to admission, patient has been skipping meals due to poor appetite and food insecurity. Patient states he has good appetite.  - Continue goal PO fluid intake of 1.6L   - Regular diet +  Ensure supplements PRN  - D/c Ergocalciferol qMon --> Vit D3 5,000 units daily  - Aquadex multivitamin    - Ca 300 mg BID    #Electrolyte abnormalities: hypomagnesemia, hypophosphatemia: Mg 1.2 (1.6), 2.8 (3.3)   - qMTh CMP, Mg, Phos  - Urine labs--K 9.1, Phos 23.2 , Mag 3.1,Cr 29.6    -PTH initially read as elevated to 263.1 but now showing invalid result   -F/u PTHrP, Vit D  - Hypomagnesemia   - mag sulphate IV prn (10/15, 10/17, 10/21, 10/23)   - given episode of diarrhea, avoid PO magnesium  - Hypophosphatemia    - k-phos TID (each tablet has 16 mmol phos, 2 mmol K)    #Constipation: currently recovering from brief episodes of diarrhea, so has not needed miralax  - Miralax 17 g BID PRN     #Steroid induced hyperglycemia:??A1c??4.9% but with postprandial hyperglycemia. Still on PO prednisone 40mg  daily.   -Increase to Lantus 10 units at bedtime (give 6 units  if NPO)  -Restart nutritional insulin when eating: Lispro 4 units with meals (Hold if NPO)  -Continue PRN Lispro dose for overnight meal: 4 units for normal-large meal, 2 units for smaller meal  -Continue Lispro correction 1:50>150 ACHS  -Endocrine following, appreciate    #Thrombocytopenia: Plts low at 41 prior to liver bx 10/22 s/p 1 unit platelets. Repeat plts 10/22 40.  - s/p plt transfusion on 10/13, 10/15, 10/21 for PLT of 50k     #Impetigo Skin Lesions on Right Ear and Left posterior Neck, + MSSA, healing well   - Mupirocin ointment for both lesions until healed, per derm    #Steroid-induced acne, possible fungal folliculitis   - topical clindamycin, benzoyl peroxide, Ketoconazole shampoo    ??  #Major Depressive Disorder, insomnia: Stable. Has hx of attempted overdoses, with the last attempt in April 2019??at which point he was hospitalized in the Adolescent Psychiatry unit and discharged on Lexapro 20 mg. ??He has not engaged with mental health resources or taken medication for depression or insomnia in 8-10 months.????Denies SI, passive suicidality, self-harm, or HI at this time.    - Patient saw adult psychiatry team again on 10/22. Offered SSRI, but patient not interested at this time  - Melatonin 3 mg q1800 for sleep and circadian rhythm regulation    #Deconditioning. Patient endorses feeling weak and having limited mobility.   -PT following  -Encourage OOB and ambulation    #Homelessness - Social Concerns:??[name] Gross has experienced homelessness for ~5 months when he left his mother's house. ??He has severed all ties with his family and states his relationship with his mother cannot be reconciled. ??He also requested contact information of family members to be erased from his chart (this has been completed, but social worker did drop contact info into her note on 9/24) and he more recently has reconciled with some family members. ??In addition to food insecurity, he has not been able to secure medications. ??He is routinely exposed to alcohol and drugs (marijuana, cocaine, etc.) second-hand through his friendships though he denies current use himself. ??On urine drug screen, he tested positive for marijuana only.  -Supportive care following.  -Designated a personal friend [name]) and grandfather to be his HCPOAs (Friend is the Primary).   -Spoke with sister on 10/16 (has not been in regular contact)   -Paternal grandmother now visiting regularly   ??  Access: PICC (placed 9/28), will need removal if he becomes unstable in the setting of CLABSI infection   ??  Discharge criteria:??liver transplant work-up and treatment of acute rejection    Subjective:  Patient had 2 BM overnight, no diarrhea. Has no complaints today. Afebrile, VSS.     Objective:     Vital signs in last 24 hours:  Temp:  [36.8 ??C-37.1 ??C] 36.8 ??C  Heart Rate:  [82-97] 97  Resp:  [24-26] 24  BP: (111-122)/(69-87) 122/87  MAP (mmHg):  [81-98] 98  SpO2:  [100 %] 100 %  Intake/Output last 3 shifts:  I/O last 3 completed shifts:  In: 3606.3 [P.O.:1920; I.V.:1686.3]  Out: 2925 [Urine:2925]    Physical Exam:   General:  Sitting at edge of bed working on laptop. Thin appearing. Interactive and conversational   Head: normocephalic, atraumatic.  Eyes: EOMI. Marked scleral icterus present.   Nose:   clear, no discharge  Lungs: clear breath sounds bilaterally. No increased work of breathing. No wheezes/crackles. Good aeration at the bases.   Heart: regular rate and rhythm. No murmurs appreciated on auscultation.  Abdomen:  Abdomen protuberant. Ascites present. No HSM.   Neuro: deconditioned, moves all extremities with decreased strength.   Extremities: non-pitting edema in the LE, WWP  Skin: Skin lesions on left neck, healing well. Skin lesion present on right ear with scabbed. Acneform rash present on face, back, chest.     Active Medications reviewed and KEY Medications include:     Current Facility-Administered Medications:   ???  ampicillin-sulbactam dilution (UNASYN) injection 3 g, 3 g, Intravenous, Q6H, David Stall, MD, 3 g at 09/02/19 1610  ???  benzoyl peroxide 5 % gel, , Topical, Nightly, Alfredia Ferguson, MD  ???  calcium carbonate (OS-CAL) tablet 300 mg elem calcium, 300 mg elem calcium, Oral, BID, David Stall, MD, 300 mg elem calcium at 09/02/19 0952  ???  cholecalciferol (vitamin D3) tablet 5,000 Units, 5,000 Units, Oral, Daily, David Stall, MD  ???  clindamycin (CLEOCIN T) 1 % lotion 1 application, 1 application, Topical, daily, Alfredia Ferguson, MD, 1 application at 09/01/19 1648  ???  dextrose 50 % in water (D50W) 50 % solution 12.5 g, 12.5 g, Intravenous, Q10 Min PRN, David Stall, MD  ???  heparin preservative-free injection 10 units/mL syringe (HEPARIN LOCK FLUSH), 20 Units, Intravenous, Daily PRN, David Stall, MD, 20 Units at 08/27/19 1524  ???  influenza vaccine quad (FLUARIX, FLULAVAL, FLUZONE) (6 MOS & UP) 2020-21, 0.5 mL, Intramuscular, During hospitalization, David Stall, MD  ???  insulin glargine (LANTUS) injection 10 Units, 10 Units, Subcutaneous, Nightly, Lovie Chol, MD, 10 Units at 09/01/19 2142  ???  insulin lispro (HumaLOG) injection 0-5 Units, 0-5 Units, Subcutaneous, 5XD insulin, Lovie Chol, MD, 3 Units at 09/01/19 2141  ???  insulin lispro (HumaLOG) injection 4 Units, 4 Units, Subcutaneous, 5XD insulin, Chinelo C Okigbo, MD, 4 Units at 09/02/19 9604  ???  ketoconazole (NIZORAL) 2 % shampoo, , Topical, daily, Alfredia Ferguson, MD  ???  magnesium sulfate 2gm/60mL IVPB, 2 g, Intravenous, Once, David Stall, MD, Last Rate: 25 mL/hr at 09/02/19 1132, 2 g at 09/02/19 1132  ???  melatonin tablet 3 mg, 3 mg, Oral, QPM, Jonna Clark, MD, 3 mg at 09/01/19 1900  ???  multivitamin, with zinc (AQUADEKS) chewable tablet, 2 tablet, Oral, Daily, David Stall, MD, 2 tablet at 09/02/19 0809  ???  mupirocin (BACTROBAN) 2 % ointment, , Topical, TID, David Stall, MD  ???  mycophenolate (MYFORTIC) EC tablet 540 mg, 540 mg, Oral, BID, David Stall, MD, 540 mg at 09/02/19 0809  ???  nystatin (MYCOSTATIN) oral suspension, 1,000,000 Units, Oral, TID, David Stall, MD, 1,000,000 Units at 09/02/19 5409  ???  ondansetron (ZOFRAN) injection 4 mg, 4 mg, Intravenous, Once PRN, Purvis Kilts, MD  ???  ondansetron (ZOFRAN-ODT) disintegrating tablet 4 mg, 4 mg, Oral, Q8H PRN, David Stall, MD  ???  pantoprazole (PROTONIX) EC tablet 40 mg, 40 mg, Oral, Daily, David Stall, MD, 40 mg at 09/01/19 2134  ???  phytonadione (vitamin K1) (AQUA-MEPHYTON) 7.5 mg in sodium chloride (NS) 0.9 % 50 mL IVPB, 7.5 mg, Intravenous, Daily, Lewie Loron, MD, Last Rate: 173.3 mL/hr at 09/02/19 0811, 7.5 mg at 09/02/19 0811  ???  polyethylene glycol (MIRALAX) packet 17 g, 17 g, Oral, BID PRN, David Stall, MD  ???  potassium phosphate (monobasic) (K-PHOS) tablet 500 mg, 500 mg, Oral, TID, David Stall, MD, 500 mg at 09/02/19 0810  ???  predniSONE (DELTASONE) tablet 40  mg, 40 mg, Oral, Daily, David Stall, MD, 40 mg at 09/02/19 1610  ???  promethazine (PHENERGAN) 1 mg/mL pediatric dilution injection 6.25 mg, 6.25 mg, Intravenous, Q15 Min PRN, Purvis Kilts, MD  ???  rifAXIMin Burman Blacksmith) tablet 550 mg, 550 mg, Oral, BID, David Stall, MD, 550 mg at 09/02/19 0810  ???  sodium chloride (NS) 0.9 % infusion, , Intravenous, Continuous, David Stall, MD, Last Rate: 10 mL/hr at 08/26/19 0537, 1,000 mL at 08/27/19 0731  ???  sodium chloride (NS) 0.9 % infusion, 10 mL/hr, Intravenous, Continuous, Ejiofor A Lucky Cowboy., MD, Last Rate: 10 mL/hr at 09/01/19 2355, 10 mL/hr at 09/01/19 2355  ???  sulfamethoxazole-trimethoprim (BACTRIM) 400-80 mg tablet 80 mg of trimethoprim, 1 tablet, Oral, Q MWF, David Stall, MD, 80 mg of trimethoprim at 09/02/19 9604  ???  tacrolimus (PROGRAF) capsule 4 mg, 4 mg, Oral, BID, David Stall, MD  ???  valGANciclovir (VALCYTE) tablet 450 mg, 450 mg, Oral, Daily, David Stall, MD, 450 mg at 09/01/19 1645        Studies: Personally reviewed and interpreted.  Labs/Studies:  Labs and Studies from the last 24hrs per EMR and Reviewed and     Lab Results   Component Value Date    WBC 2.9 (L) 09/01/2019    HGB 9.7 (L) 09/01/2019    HCT 29.2 (L) 09/01/2019    PLT 40 (L) 09/01/2019       Lab Results   Component Value Date    NA 136 09/02/2019    K 3.9 09/02/2019    CL 107 09/02/2019    CO2  09/02/2019      Comment:      Specimen icteric.      BUN 15 09/02/2019    CREATININE 0.78 09/02/2019    GLU 135 09/02/2019    CALCIUM 8.0 (L) 09/02/2019    MG 1.2 (L) 09/02/2019    PHOS 2.8 (L) 09/02/2019     Lab Results   Component Value Date    BILITOT 35.2 (H) 09/01/2019    BILIDIR 31.20 (H) 09/02/2019    PROT 09/01/2019      Comment:      Icteric specimen.      ALBUMIN 2.7 (L) 09/01/2019    ALT  09/01/2019      Comment:      Icteric specimen.      AST 142 (H) 09/01/2019    ALKPHOS 814 (H) 09/01/2019    GGT 481 (H) 09/02/2019     Lab Results   Component Value Date    PT 18.0 (H) 09/01/2019    INR 1.55 09/01/2019    APTT 25.0 (L) 08/14/2019     ========================================  Jonna Clark, MD  Anesthesiology - PGY-1  Pager: [phone]  I supervised the resident physician on subsequent day care who spent at least 25 minutes on the floor or unit in direct patient care. The direct patient care time included face-to-face time with the patient, reviewing the patient's chart, communicating with the family and/or other professionals and coordinating care. Greater than 50% of this time was spent in counseling or coordinating care with the patient regarding management  of acute liver rejection . I was available throughout care provided. Alita Chyle, MD

## 2019-09-02 NOTE — Unmapped (Signed)
Follow-up Pediatric Palliative Care Consult Note:  ??  [name] Gross is a 18 y.o. male seen for pediatric palliative care consultation at the request of Dr. Curtis Sites for goals of care, advance care planning, and family support.  ??  Primary Care Provider: [name], MD  ??  History provided by: Patient  ??  Assessment:  ??  [name] Gross is a 18 y.o. 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 His liver function has not yet responded to high dose steroids and thymoglobulin, which is concerning for possible need for another liver transplant if he could be eligible, which providers have expressed may not be likely for psychosocial reasons. [name] Gross is at risk for losing his ability to care for himself/make decisions for himself. He was encouraged to identify a surrogate decision maker (he does not wish for this to default to next of kin) during this time while he is at risk for further decompensation/further decline in health.  He has since identified a close friend and maternal grandfather for this role.  Additionally, [name] Gross is actively completing Voicing My Choices so that decision makers and family will know his wishes.   Palliative care consult requested for goals of care, advance care planning, and family support.     ??  Summary of Discussion and Recommendations:   ??  1. SYMPTOMS:   - Anyelo is s/p liver biopsy on 10/21.  He reported dull pain at the biopsy site but declined intervention; medication, heating pad.  Results were not back yet, but he stated his primary team requested the results be expedited.  Kosei denied anxiety or worry as he waits.    - Moiz reported that his appetite remained good, he was not having nausea, and he was sleeping better.  He also found visit with Psychiatry on 10/22 to be helpful, though he declined medication and was unsure if he would like to pursue therapy following discharge.  ??  2. GOALS:   - Dietrick hopes to make a recovery, find housing, and find a job. He shared that he anticipates discharging to a shelter but notes is friends are encouraging him to move in with family - he believes this is because they are concerned about his safety.   - Luccas has long term goals which include going back to school and becoming a Runner, broadcasting/film/video or nurse.   ??  3. DECISIONS:   - Given risk of hepatic encephalopathy, and general risk of his condition deteriorating, recommended Keelan think about who he would want to be his surrogate decision maker if he was unable to make decisions for himself (see ACP below)   - Keigo likes to receive information directly.   - Dr. Melrose Nakayama has reviewed with [place] that redo liver transplant can be an option for some people and inquired whether this is something Darragh knows about. Kriston acknowledged knowing that he may not be a candidate and that he doesn't know if he would choose to pursue another transplant. Note that he has been told by transplant SW that if he would like to be a candidate for liver transplant, he needs social supports including stable housing, transportation, source of income, et.    - [facility] Decision Making Tool given and explained at a previous visit. He was encouraged to use this tool along with an ACP guide to document information he receives from providers.   ??  4. ADVANCE CARE PLANNING  - Reminded [name] Gross of the Voicing My Choices booklet and offered to assist him with completing book if wanted/needed. [name] Gross worked on Voicing My Choices more over the weekend and would like to designate a Runner, broadcasting/film/video. See ACP note.   - Recommended [name] Gross think about who he would want to be his surrogate decision maker if he was unable to make decisions for himself. He has repeatedly shared that he would not trust his mother to make decisions for him. Pellegrino has started this conversation with friends, [name] and [name], who he agrees would need more information about what that role may look like and not updated on Oliver's current clinical status   - Code status: Full code, not discussed today.  - Prognosis: Guarded given severity of liver disease and potential for lack of response to salvage therapies, AKI, and complex social situation with lack of caregiver support.  ??  5. SUPPORT:   - Jerame describes limited personal supports including an aunt and maternal grandparents, and friends. He has not spoken to any family since being admitted to the hospital. He has reached out to two friends who have been good supports throughout his illness  - Discussed hospital based resources including chaplains.  - Ciel agreed to begin SSI application as suggested by transplant social worker, as the process could take months to complete.  He has since decided to wait until he was on a secure network instead of the an open/public network at the hospital due to security concerns.  He was also told how to access his medical records.  ??  6. CARE COORDINATION:   - Care coordinated with nursing.         Total time spent with patient for evaluation & management (excluding ACP documented separately): 15 minutes.  Greater than 50% of this time spent on counseling/coordination of care: Yes.  See ACP Note from today for additional billable service: Yes.     We appreciate the consult. The Children[facility] Supportive Care Team can be reached by pager [name]) or email [email or web address]).   ??  ??  History of Present Illness:  Hanan??is a 18 y.o.??male??with history primary sclerosing cholangitis s/p liver transplant (2017) with multiple??epsiodes??of rejection, depression (history of suicide attempt via over dose in April 2019)??who??was admitted with reports of??black stools, abdominal pain, and icteric sclera??most likely due to acute liver transplant rejection/failure. His hospital course has been complicated by AKI, significant ascites. His liver function has not yet responded to pulse dose steroids, primary team concerned about overall prognosis and need for repeat liver transplant.   ??  Interval events: Awaiting results of liver biopsy from 10/21.  Meeting with transplant team scheduled for 10/27.  Paternal grandmother, Fulton Mole, will be present for meeting.     Eri reported that paternal grandmother would be visiting this weekend with his father.  He expressed hesitancy about his father visiting but decided to be open and make the most of the visit.     Allergies:  Bee pollen and Pollen extracts  ??  Medications:  Scheduled Meds:  ??? ampicillin-sulbactam  3 g Intravenous Q6H   ??? benzoyl peroxide   Topical Nightly   ??? calcium carbonate  300 mg elem calcium Oral BID   ??? cholecalciferol (vitamin D3)  5,000 Units Oral Daily   ??? clindamycin  1 application Topical daily   ??? flu vacc [id number] 6mos up(PF)  0.5 mL Intramuscular During hospitalization   ??? insulin glargine  10 Units  Subcutaneous Nightly   ??? insulin lispro  0-5 Units Subcutaneous 5XD insulin   ??? insulin lispro  4 Units Subcutaneous 5XD insulin   ??? ketoconazole   Topical daily   ??? magnesium sulfate  2 g Intravenous Once   ??? melatonin  3 mg Oral QPM   ??? multivitamin, with zinc  2 tablet Oral Daily   ??? mupirocin   Topical TID   ??? mycophenolate  540 mg Oral BID   ??? nystatin  1,000,000 Units Oral TID   ??? pantoprazole  40 mg Oral Daily   ??? phytonadione (AQUA-MEPHYTON) intravenous  7.5 mg Intravenous Daily   ??? potassium phosphate (monobasic)  500 mg Oral TID   ??? predniSONE  40 mg Oral Daily   ??? rifAXIMin  550 mg Oral BID   ??? sulfamethoxazole-trimethoprim  1 tablet Oral Q MWF   ??? tacrolimus  4 mg Oral BID   ??? valGANciclovir  450 mg Oral Daily     Continuous Infusions:  ??? sodium chloride 500 mL (08/26/19 0537)   ??? sodium chloride 10 mL/hr (09/01/19 2355)     PRN Meds:.dextrose 50 % in water (D50W), heparin, porcine (PF), ondansetron, ondansetron, polyethylen glycol, promethazine    Physical Exam:   Vitals:    09/02/19 0844   BP: 122/87   Pulse: 97   Resp: 24   Temp: 36.8 ??C (98.2 ??F)   SpO2: 100%   ??  Gen: thin young man lying in bed, initially with linen covering head, NAD.  Skin: jaundiced.  HEENT: +scleral icterus.   Chest: normal work of breathing  Abd: protruberant  Psych: smiling, engaging.   ??  Data: Reviewed test results in Epic.    La Lindwood Qua, [place], CPNP  [facility] Supportive Care Team  [phone] (pager)      Teaching Physician Attestation    I was the supervising physician in the delivery of the service. I was involved with making the recommendations contained in this note, and communicated these recommendations to the primary service.     Vladimir Creeks, MD  Attending Physician, [facility] Supportive Care Team

## 2019-09-02 NOTE — Unmapped (Signed)
Pt afebrile, VSS. No c/o pain or nausea. PICC c/d/I infusing. Bld cx drawn both via central line and peripherally. Voiding adequately. Eating and drinking well today. Agreeable to accu checks and received sliding scale and fixed lispro. No family present today. Will continue to monitor.

## 2019-09-02 NOTE — Unmapped (Signed)
Endocrinology Consult - Follow Up Note    Requesting Attending Physician :  Arnold Long Mir, MD  Service Requesting Consult : Ped Gastroenterology [facility])  Primary Care Provider: [name], MD  Outpatient Endocrinologist: N/A    Assessment/Recommendations:  [name] Gross is a 18 y.o. male who is admitted for Liver transplant rejection (CMS-HCC). Endocrinology have been consulted to evaluate patient for hyperglycemia.    Steroid induced hyperglycemia: A1c 4.9% but with postprandial hyperglycemia. Still on PO prednisone 40mg  daily. He has proven that Lantus 10 units nightly work well for him given the normal FBG. Still having prandial hyperglycemia though improving. Recommend the following regimen:  -Continue Lantus 10 units at bedtime (give 6 units if NPO)  -Continue Lispro 4 units with meals (Hold if NPO) - ordered as 5x a day to allow for late night meal coverage  -Continue Lispro correction 1:50>150 with meals and at bedtime    The patient was discussed with attending, Dr. Aleen Campi    Primary team informed of recommendations. We will continue to follow patient as needed. Please call/page [phone] with questions or concerns.     Farrel Conners, MD  [facility] Endocrinology Fellow      History of Present Illness:     Reason for Consult: hyperglycemia.     [name] Gross is a 18 y.o. male who is admitted for liver transplant rejection (CMS-HCC). Endocrinology have been consulted to evaluate patient for hyperglycemia.    Interval history: He has proven that Lantus 10 units nightly work well for him given the normal FBG. Still having prandial hyperglycemia though improving. No new complaints today.    Current regimen: Lantus 10 units qhs, Lispro 4u TIDAC, prn lispro for late night eating 2-4u, lispro 1:50>150 q4hrs    Active Orders   Diet    Nutrition Therapy Regular/House     ROS:  As per HPI, otherwise 10 system ROS was negative.      All Medications:   Current Facility-Administered Medications Medication Dose Route Frequency Provider Last Rate Last Dose   ??? ampicillin-sulbactam dilution (UNASYN) injection 3 g  3 g Intravenous Q6H David Stall, MD   3 g at 09/02/19 0304   ??? benzoyl peroxide 5 % gel   Topical Nightly Alfredia Ferguson, MD       ??? calcium carbonate (OS-CAL) tablet 300 mg elem calcium  300 mg elem calcium Oral BID David Stall, MD   300 mg elem calcium at 09/01/19 2134   ??? clindamycin (CLEOCIN T) 1 % lotion 1 application  1 application Topical daily Alfredia Ferguson, MD   1 application at 09/01/19 1648   ??? dextrose 50 % in water (D50W) 50 % solution 12.5 g  12.5 g Intravenous Q10 Min PRN David Stall, MD       ??? ergocalciferol (DRISDOL) capsule 50,000 Units  50,000 Units Oral Weekly David Stall, MD   50,000 Units at 08/29/19 [phone]   ??? heparin preservative-free injection 10 units/mL syringe (HEPARIN LOCK FLUSH)  20 Units Intravenous Daily PRN David Stall, MD   20 Units at 08/27/19 1524   ??? influenza vaccine quad (FLUARIX, FLULAVAL, FLUZONE) (6 MOS & UP) 2020-21  0.5 mL Intramuscular During hospitalization David Stall, MD       ??? insulin glargine (LANTUS) injection 10 Units  10 Units Subcutaneous Nightly Lovie Chol, MD   10 Units at 09/01/19 2142   ??? insulin lispro (HumaLOG) injection 0-5 Units  0-5  Units Subcutaneous 5XD insulin Lovie Chol, MD   3 Units at 09/01/19 2141   ??? insulin lispro (HumaLOG) injection 4 Units  4 Units Subcutaneous 5XD insulin Lovie Chol, MD   4 Units at 09/01/19 2140   ??? ketoconazole (NIZORAL) 2 % shampoo   Topical daily Alfredia Ferguson, MD       ??? melatonin tablet 3 mg  3 mg Oral QPM Jonna Clark, MD   3 mg at 09/01/19 1900   ??? multivitamin, with zinc (AQUADEKS) chewable tablet  2 tablet Oral Daily David Stall, MD   2 tablet at 09/01/19 0913   ??? mupirocin (BACTROBAN) 2 % ointment   Topical TID David Stall, MD       ??? mycophenolate (MYFORTIC) EC tablet 540 mg  540 mg Oral BID David Stall, MD   540 mg at 09/01/19 2131   ??? nystatin (MYCOSTATIN) oral suspension  1,000,000 Units Oral TID David Stall, MD   1,000,000 Units at 09/01/19 2131   ??? ondansetron [facility]) injection 4 mg  4 mg Intravenous Once PRN Purvis Kilts, MD       ??? ondansetron (ZOFRAN-ODT) disintegrating tablet 4 mg  4 mg Oral Q8H PRN David Stall, MD       ??? pantoprazole (PROTONIX) EC tablet 40 mg  40 mg Oral Daily David Stall, MD   40 mg at 09/01/19 2134   ??? phytonadione (vitamin K1) (AQUA-MEPHYTON) 7.5 mg in sodium chloride (NS) 0.9 % 50 mL IVPB  7.5 mg Intravenous Daily Lewie Loron, MD 173.3 mL/hr at 09/01/19 1000 7.5 mg at 09/01/19 1000   ??? polyethylene glycol (MIRALAX) packet 17 g  17 g Oral BID PRN David Stall, MD       ??? potassium phosphate (monobasic) (K-PHOS) tablet 500 mg  500 mg Oral TID David Stall, MD   500 mg at 09/01/19 2133   ??? predniSONE (DELTASONE) tablet 40 mg  40 mg Oral Daily David Stall, MD   40 mg at 09/01/19 0900   ??? promethazine (PHENERGAN) 1 mg/mL pediatric dilution injection 6.25 mg  6.25 mg Intravenous Q15 Min PRN Purvis Kilts, MD       ??? rifAXIMin Burman Blacksmith) tablet 550 mg  550 mg Oral BID David Stall, MD   550 mg at 09/01/19 2128   ??? sodium chloride (NS) 0.9 % infusion   Intravenous Continuous David Stall, MD 10 mL/hr at 08/26/19 0537 1,000 mL at 08/27/19 0731   ??? sodium chloride (NS) 0.9 % infusion  10 mL/hr Intravenous Continuous Ejiofor A Lucky Cowboy., MD 10 mL/hr at 09/01/19 2355 10 mL/hr at 09/01/19 2355   ??? sulfamethoxazole-trimethoprim (BACTRIM) 400-80 mg tablet 80 mg of trimethoprim  1 tablet Oral Q MWF David Stall, MD   80 mg of trimethoprim at 08/31/19 0816   ??? tacrolimus (PROGRAF) capsule 3.5 mg  3.5 mg Oral BID Alfredia Ferguson, MD   3.5 mg at 09/01/19 2128   ??? valGANciclovir (VALCYTE) tablet 450 mg  450 mg Oral Daily David Stall, MD   450 mg at 09/01/19 1645         Objective: :  BP 116/70  - Pulse 88  - Temp 37.1 ??C (Oral)  - Resp 24  - Ht 174.6 cm (5' 8.74)  - Wt 60.4 kg (133 lb 2.5 oz)  - SpO2 100%  - BMI 19.81 kg/m??     Physical Exam:  GEN: NAD, sitting out of bed, and playing with his laptop, jaundiced  Resp: no increased WOB  ABD: no distension  PSYCH: flat affect  Skin: scattered papules noted    Test Results    Lab Results   Component Value Date    POCGLU 134 09/02/2019    POCGLU 257 (H) 09/01/2019    POCGLU 196 (H) 09/01/2019    POCGLU 138 09/01/2019    POCGLU 95 09/01/2019    POCGLU 216 (H) 08/31/2019    POCGLU 219 (H) 08/30/2019    POCGLU 143 08/30/2019    POCGLU 117 08/30/2019    POCGLU 99 08/30/2019     Lab Results   Component Value Date    A1C 5.0 08/17/2019    A1C 4.9 10/13/2018    A1C 7.6 (H) 07/16/2018     Lab Results   Component Value Date    CREATININE 0.91 09/01/2019    NA 139 09/01/2019    K 3.8 09/01/2019    CL 110 (H) 09/01/2019    CO2  09/01/2019      Comment:      Icteric specimen.      ANIONGAP  09/01/2019      Comment:      Unable to calculate      CALCIUM 7.7 (L) 09/01/2019    ALBUMIN 2.7 (L) 09/01/2019     Lab Results   Component Value Date    WBC 2.9 (L) 09/01/2019    HGB 9.7 (L) 09/01/2019    PLT 40 (L) 09/01/2019     Lab Results   Component Value Date    AST 142 (H) 09/01/2019    ALT  09/01/2019      Comment:      Icteric specimen.      ALKPHOS 814 (H) 09/01/2019     Lab Results   Component Value Date    TSH 0.797 02/11/2018    FREET4 1.49 02/11/2018     Lab Results   Component Value Date    CHOL 127 08/12/2017    TRIG 49 08/12/2017    HDL 42 08/12/2017    LDL 75 08/12/2017

## 2019-09-02 NOTE — Unmapped (Signed)
Problem: Adult Inpatient Plan of Care  Goal: Plan of Care Review  Outcome: Progressing   No acute events noted. Afebrile, VSS.  Abdominal distension and jaundice persist. Amen c/o discomfort at Liver biopsy site last evening, he did not qualify the pain with a score and refused intervention. Care Team informed of Marcelle's complaint. IV hydration into RUE PICC continues, site w/o s/s of complications. Scheduled Ampicillin continues q6hr. AML collection has been paired w/ collection of AM Tacrolimus trough. Voiding and stooling. Urine color remains Amber. Stool is soft/brown. Enteric Precautions maintained. [name] is compliant w/ POC.   Will continue to monitor.

## 2019-09-02 NOTE — Unmapped (Signed)
On September 02, 2019 received referral from [place], [facility]* and  Dr. Melrose Nakayama for liver transplant evaluation. [name] Gross is a 18 y.o. 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  Referral entered in EPIC.   Request will be sent to the [facility] for insurance verification.

## 2019-09-03 LAB — PHOSPHORUS: Phosphate:MCnc:Pt:Ser/Plas:Qn:: 2.9

## 2019-09-03 LAB — BILIRUBIN DIRECT: Bilirubin.glucuronidated+Bilirubin.albumin bound:MCnc:Pt:Ser/Plas:Qn:: 31.2 — ABNORMAL HIGH

## 2019-09-03 LAB — MAGNESIUM: Magnesium:MCnc:Pt:Ser/Plas:Qn:: 1.3 — ABNORMAL LOW

## 2019-09-03 LAB — GAMMA GLUTAMYL TRANSFERASE: Gamma glutamyl transferase:CCnc:Pt:Ser/Plas:Qn:: 456 — ABNORMAL HIGH

## 2019-09-03 NOTE — Unmapped (Signed)
Pediatric Daily Progress Note     Assessment/Plan:     Principal Problem:    Liver transplant rejection (CMS-HCC)  Active Problems:    Recurrent major depressive disorder, in partial remission (CMS-HCC)    Malnutrition of mild degree (CMS-HCC)    History of liver transplant (CMS-HCC)    Transaminitis    Normocytic anemia    Hypoalbuminemia    Steroid-induced hyperglycemia    Pityrosporum folliculitis    Abdominal pain    Homeless    Thrombocytopenia (CMS-HCC)    Hypomagnesemia    Hypophosphatasia    Other ascites    MSSA bacteremia    Hyperbilirubinemia    Impetigo lesions of Right Ear and Posterior Neck    Steroid-induced acne    Liver dysfunction secondary to transplant rejection     Irritant contact dermatitis of the hands    Sleep difficulties    Strep Mitis Central line-associated bloodstream infection  Resolved Problems:    Cholangitis of transplanted liver (CMS-HCC)    Melena    Dark stools    Pj??is a 18 y.o.??male??with history of??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 Along with steroids, tacrolimus, and cellcept, thyroglobulin treatment was initiated (on 10/01-10/12) but yielded limited benefit in liver studies to date. Patient has discussed prognosis with Dr. Melrose Nakayama and palliative care including the possibility of not recovering from liver rejection (10/9). Patient understands that transplant is an option but that he may not be a candidate. His main active issue currently is treating CLABSI secondary to strep mitis and MSSA (found after cultures obtained secondary to leukocytosis on daily labs) with Unasyn for a total of a 14 day course (through 10/27) and salvaging his PICC line for now given difficulty with access. Due to his infection all of his immunosuppressants (thymoblobulin, Cellcept, tacrolimus), were held, his steroids were weaned, and his repeat liver biopsy were delayed until his bloodstream infection was adequately managed.  On 10/19, his immunosuppressants were restarted, and biopsy was rescheduled. His other active issues are complications secondary to liver dysfunction (hypoalbuminemia, ascites, edema, abdominal pain, elevated INR, electrolyte abnormalities). Currently his ascites/edema is stable; we will continue to monitor closely and consider albumin/lasix as needed. He also has steroid-induced hyperglycemia, currently being managed on insulin with daily adjustments.     #Hx Liver Transplant - Subacute/Acute Liver Failure: ??Biopsy confirmed severe acute rejection (9/25), RAI 9/9. Liver labs without a significant improvement after immunosuppression. S/p 12 days of ATG and high dose MethylPrednisolone (10/1-10/12) with poor overall response to therapy. Labs 10/21 with Tbili 32.5 (33.9), Dbili 29.70 (30/.80) , GGT 518 (537) (500).  Liver biopsy completed on 10/21.  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   - Indeterminate for acute cellular rejection, Compared to previous bx (08/05/2019), lymphoplasmacytic infiltrate markedly decreased   -C3D stain sent to [facility]  -Tacro and Mycophenolate restarted 10/20    -10/23 Tac trough 7.5 (goal 8-10) --> increase to 4 mg BID   -Appreciate pharmacy assistance   -Next tac trough 10/25  - Continue prednisone 40mg  today  - Continue Valgancyclovir 450mg  daily (3months),  Bactrim MWF (47mo) for prophylaxis (10/2-) and Nystatin 10ml TID (36mo) prophylaxis (10/2-)  - Vitamin K IV daily without change. Can discuss switching to PO with Dr. Melrose Nakayama    -F/u Des-gamma-carboxy-prothrombin- if pt low in vit K, should result as elevated  - Protonix 40 mg PO daily  - Continue Rifaximin (given elevated ammonia, no encephalopathy)   - PT/INR, CBC, CMP on M/Th   - 10/21 liver transplant committee determined patient should proceed with complete liver transplant evaluation    # Elevated Creatinine: Cr was elevated to 0.91 (0.78, 0.66), coincides with restarting and increasing the dose of tacrolimus, likely causing nephrotoxicity. Patient is making great urine. Repeat Creatinine is 0.78 on 10/23.   -Continue to monitor     # Acute Diarrhea, resolved: Patient endorsed 3x episodes of diarrhea on 10/20 overnight which has now resolved. Work-up negative: Cdiff negative. GIPP negative. Crypto ag negative. CMV negative. Stool ova and parasite negative.    #CLABSI: Strep mitis and MSSA   Developed sudden leukocytosis with white count elevated to 17.3 10/12, afebrile, non-toxic appearing, without localizing symptoms. Found to have positive Bcx 10/12 growing strep mitis and MSSA. Given ascites and risk for SBP secondary to strep mitis, also covering for anaerobes. Echo negative for endocarditis. Given patient had low WBC to 2.9 on 10/22, will repeat BCx.  - 10/22 BCx NGTD  - CBC M/Th  - 10/12 peripheral/PICC Cx positive for Strep mitis   - 1/012 peripheral - +MSAA  - f/u Bcx:   - 10/12 - NGTD   - 10/13 - NGTD   - 10/14 - NGTD   - 10/22-  NGTD  - Per ID recs:   - Unasyn x 14 days total (10/13-10/27)   - Given unreliable PIV access, will attempt to salvage his PICC while treating his infection   - Draw new bcx if any repeat cultures turn positive     #Edema, ascites, abdominal pain secondary to liver dysfunction: Ascites still present, stable.   - Holding IVFs, continue to encourage good PO intake  - Following daily weights  - STRICT I/Os      #Hypoalbuminemia: Albumin 2.7 (2.6)  - Likely contributing to ascites/edema  - Can consider albumin #Nutriton: severe protein-calorie malnutrition. Prior to admission, patient has been skipping meals due to poor appetite and food insecurity. Patient states he has good appetite.  - Continue goal PO fluid intake of 1.6L   - Regular diet +  Ensure supplements PRN  - D/c Ergocalciferol qMon --> Vit D3 5,000 units daily  - Aquadex multivitamin    - Ca 300 mg BID    #Electrolyte abnormalities: hypomagnesemia, hypophosphatemia: Mg 1.3 (1.2), 2.9 (2.8)   - qMTh CMP, Mg, Phos  - Urine labs--K 9.1, Phos 23.2 , Mag 3.1,Cr 29.6    -PTH initially read as elevated to 263.1 but now showing invalid result   -F/u PTHrP, Vit D  - Hypomagnesemia   - mag sulphate IV prn (10/15, 10/17, 10/21, 10/23, 10/24)   - given episode of diarrhea, avoid PO magnesium, but may need to consider restarting Magnesium as outpatient  - Hypophosphatemia    - k-phos TID switched to neutra phos TID      #Constipation: currently recovering from brief episodes of diarrhea, so has not needed miralax  - Miralax 17 g BID PRN     #Steroid induced hyperglycemia:??A1c??4.9% but with postprandial hyperglycemia. Still on PO prednisone 40mg  daily.   -Increase to Lantus 10 units at bedtime (give 6  units if NPO)  -Restart nutritional insulin when eating: Lispro 4 units with meals (Hold if NPO)  -Continue PRN Lispro dose for overnight meal: 4 units for normal-large meal, 2 units for smaller meal  -Continue Lispro correction 1:50>150 ACHS  -Endocrine following, appreciate    #Thrombocytopenia: Plts low at 41 prior to liver bx 10/22 s/p 1 unit platelets. Repeat plts 10/22 40.  - s/p plt transfusion on 10/13, 10/15, 10/21 for PLT of 50k     #Impetigo Skin Lesions on Right Ear and Left posterior Neck, + MSSA, healing well   - Mupirocin ointment for both lesions until healed, per derm    #Steroid-induced acne, possible fungal folliculitis   - topical clindamycin, benzoyl peroxide, Ketoconazole shampoo    ??  #Major Depressive Disorder, insomnia: Stable. Has hx of attempted overdoses, with the last attempt in April 2019??at which point he was hospitalized in the Adolescent Psychiatry unit and discharged on Lexapro 20 mg. ??He has not engaged with mental health resources or taken medication for depression or insomnia in 8-10 months.????Denies SI, passive suicidality, self-harm, or HI at this time.    - Patient saw adult psychiatry team again on 10/22. Offered SSRI, but patient not interested at this time  - Melatonin 3 mg q1800 for sleep and circadian rhythm regulation    #Deconditioning. Patient endorses feeling weak and having limited mobility.   -PT following  -Encourage OOB and ambulation    #Homelessness - Social Concerns:??Shaymus has experienced homelessness for ~5 months when he left his mother's house. ??He has severed all ties with his family and states his relationship with his mother cannot be reconciled. ??He also requested contact information of family members to be erased from his chart (this has been completed, but social worker did drop contact info into her note on 9/24) and he more recently has reconciled with some family members. ??In addition to food insecurity, he has not been able to secure medications. ??He is routinely exposed to alcohol and drugs (marijuana, cocaine, etc.) second-hand through his friendships though he denies current use himself. ??On urine drug screen, he tested positive for marijuana only.  -Supportive care following.  -Designated a personal friend [name]) and grandfather to be his HCPOAs (Friend is the Primary).   -Spoke with sister on 10/16 (has not been in regular contact)   -Paternal grandmother now visiting regularly   ??  Access: PICC (placed 9/28), will need removal if he becomes unstable in the setting of CLABSI infection   ??  Discharge criteria:??liver transplant work-up and treatment of acute rejection    Subjective:  Patient had 1 BM overnight, no diarrhea. Has no complaints today. Afebrile, VSS.     Objective:     Vital signs in last 24 hours:  Temp:  [36.8 ??C-37 ??C] 36.9 ??C  Heart Rate:  [77-94] 91  Resp:  [18-22] 18  BP: (107-119)/(56-79) 112/73  MAP (mmHg):  [71-88] 85  SpO2:  [100 %] 100 %  Intake/Output last 3 shifts:  I/O last 3 completed shifts:  In: 3843.3 [P.O.:2520; I.V.:1323.3]  Out: 2250 [Urine:2250]    Physical Exam:   General:  Sitting at edge of bed working on laptop. Thin appearing. Interactive and conversational   Head: normocephalic, atraumatic.  Eyes: EOMI. Marked scleral icterus present.   Nose:   clear, no discharge  Lungs: clear breath sounds bilaterally. No increased work of breathing. No wheezes/crackles. Good aeration at the bases.   Heart: regular rate and rhythm. No murmurs appreciated on  auscultation.  Abdomen:  Abdomen protuberant. Ascites present. No HSM.   Neuro: deconditioned, moves all extremities with decreased strength. Tremor at baseline.   Extremities: non-pitting edema in the LE, WWP  Skin: Skin lesions on left neck, healing well. Skin lesion present on right ear with scabbed. Acneform rash present on face, back, chest.     Active Medications reviewed and KEY Medications include:     Current Facility-Administered Medications:   ???  ampicillin-sulbactam dilution (UNASYN) injection 3 g, 3 g, Intravenous, Q6H, Darden Dates Gutierrez-Wu, MD, 3 g at 09/03/19 1042  ???  benzoyl peroxide 5 % gel, , Topical, Nightly, Alfredia Ferguson, MD  ???  calcium carbonate (OS-CAL) tablet 300 mg elem calcium, 300 mg elem calcium, Oral, BID, David Stall, MD, 300 mg elem calcium at 09/03/19 1042  ???  cholecalciferol (vitamin D3) tablet 5,000 Units, 5,000 Units, Oral, Daily, David Stall, MD, 5,000 Units at 09/03/19 0854  ???  clindamycin (CLEOCIN T) 1 % lotion 1 application, 1 application, Topical, daily, Alfredia Ferguson, MD, 1 application at 09/01/19 1648  ???  dextrose 50 % in water (D50W) 50 % solution 12.5 g, 12.5 g, Intravenous, Q10 Min PRN, David Stall, MD  ???  heparin preservative-free injection 10 units/mL syringe (HEPARIN LOCK FLUSH), 20 Units, Intravenous, Daily PRN, David Stall, MD, 20 Units at 08/27/19 1524  ???  influenza vaccine quad (FLUARIX, FLULAVAL, FLUZONE) (6 MOS & UP) 2020-21, 0.5 mL, Intramuscular, During hospitalization, David Stall, MD  ???  insulin glargine (LANTUS) injection 10 Units, 10 Units, Subcutaneous, Nightly, Lovie Chol, MD, 10 Units at 09/02/19 2050  ???  insulin lispro (HumaLOG) injection 0-10 Units, 0-10 Units, Subcutaneous, 5XD insulin, Lovie Chol, MD, 2 Units at 09/03/19 1408  ???  insulin lispro (HumaLOG) injection 6 Units, 6 Units, Subcutaneous, 5XD insulin, Lovie Chol, MD, 6 Units at 09/03/19 1407  ???  ketoconazole (NIZORAL) 2 % shampoo, , Topical, daily, Alfredia Ferguson, MD  ???  melatonin tablet 3 mg, 3 mg, Oral, QPM, Jonna Clark, MD, 3 mg at 09/02/19 2033  ???  multivitamin, with zinc (AQUADEKS) chewable tablet, 2 tablet, Oral, Daily, David Stall, MD, 2 tablet at 09/03/19 0853  ???  mupirocin (BACTROBAN) 2 % ointment, , Topical, TID, David Stall, MD  ???  mycophenolate (MYFORTIC) EC tablet 540 mg, 540 mg, Oral, BID, David Stall, MD, 540 mg at 09/03/19 9811  ???  nystatin (MYCOSTATIN) oral suspension, 1,000,000 Units, Oral, TID, David Stall, MD, 1,000,000 Units at 09/03/19 1314  ???  ondansetron (ZOFRAN) injection 4 mg, 4 mg, Intravenous, Once PRN, Purvis Kilts, MD  ???  ondansetron (ZOFRAN-ODT) disintegrating tablet 4 mg, 4 mg, Oral, Q8H PRN, David Stall, MD  ???  pantoprazole (PROTONIX) EC tablet 40 mg, 40 mg, Oral, Daily, David Stall, MD, 40 mg at 09/02/19 2131  ???  phytonadione (vitamin K1) (AQUA-MEPHYTON) 7.5 mg in sodium chloride (NS) 0.9 % 50 mL IVPB, 7.5 mg, Intravenous, Daily, Lewie Loron, MD, Last Rate: 173.3 mL/hr at 09/03/19 0855, 7.5 mg at 09/03/19 0855  ???  polyethylene glycol (MIRALAX) packet 17 g, 17 g, Oral, BID PRN, David Stall, MD  ???  potassium & sodium phosphates 250mg  (PHOS-NAK/NEUTRA PHOS) packet 2 packet, 2 packet, Oral, TID, Lewie Loron, MD, 2 packet at 09/03/19 1315  ???  predniSONE (DELTASONE) tablet 40 mg, 40 mg, Oral, Daily, David Stall, MD, 40 mg at 09/03/19 0845  ???  promethazine (PHENERGAN) 1 mg/mL pediatric dilution injection 6.25 mg, 6.25 mg, Intravenous, Q15 Min PRN, Purvis Kilts, MD  ???  rifAXIMin Burman Blacksmith) tablet 550 mg, 550 mg, Oral, BID, David Stall, MD, 550 mg at 09/03/19 0853  ???  sodium chloride (NS) 0.9 % infusion, , Intravenous, Continuous, David Stall, MD, Last Rate: 10 mL/hr at 08/26/19 0537, 1,000 mL at 08/27/19 0731  ???  sodium chloride (NS) 0.9 % infusion, 10 mL/hr, Intravenous, Continuous, Ejiofor A Lucky Cowboy., MD, Last Rate: 10 mL/hr at 09/03/19 0851, 10 mL/hr at 09/03/19 0851  ???  sulfamethoxazole-trimethoprim (BACTRIM) 400-80 mg tablet 80 mg of trimethoprim, 1 tablet, Oral, Q MWF, David Stall, MD, 80 mg of trimethoprim at 09/02/19 1610  ???  tacrolimus (PROGRAF) capsule 4 mg, 4 mg, Oral, BID, David Stall, MD, 4 mg at 09/03/19 0855  ???  valGANciclovir (VALCYTE) tablet 450 mg, 450 mg, Oral, Daily, David Stall, MD, 450 mg at 09/02/19 1608        Studies: Personally reviewed and interpreted.  Labs/Studies:  Labs and Studies from the last 24hrs per EMR and Reviewed and     Lab Results   Component Value Date    WBC 2.9 (L) 09/01/2019    HGB 9.7 (L) 09/01/2019    HCT 29.2 (L) 09/01/2019    PLT 40 (L) 09/01/2019       Lab Results   Component Value Date    NA 136 09/02/2019    K 3.9 09/02/2019    CL 107 09/02/2019    CO2  09/02/2019      Comment:      Specimen icteric.      BUN 15 09/02/2019    CREATININE 0.78 09/02/2019    GLU 135 09/02/2019    CALCIUM 8.0 (L) 09/02/2019    MG 1.3 (L) 09/03/2019    PHOS 2.9 09/03/2019     Lab Results   Component Value Date    BILITOT 35.2 (H) 09/01/2019    BILIDIR 31.20 (H) 09/03/2019    PROT  09/01/2019      Comment: Icteric specimen.      ALBUMIN 2.7 (L) 09/01/2019    ALT  09/01/2019      Comment:      Icteric specimen.      AST 142 (H) 09/01/2019    ALKPHOS 814 (H) 09/01/2019    GGT 456 (H) 09/03/2019     Lab Results   Component Value Date    PT 18.0 (H) 09/01/2019    INR 1.55 09/01/2019    APTT 25.0 (L) 08/14/2019     ========================================  Jonna Clark, MD  Anesthesiology - PGY-1  Pager: [phone]    I supervised the resident physician on subsequent day care who spent at least 35 minutes on the floor or unit in direct patient care. The direct patient care time included face-to-face time with the patient, reviewing the patient's chart, communicating with the family and/or other professionals and coordinating care. Greater than 50% of this time was spent in counseling or coordinating care with the patient regarding liver failure, line infection, steroid-induced hyperglycemia, tacrolimus-induced hypomagnesemia. I was available throughout care provided. Noland Fordyce, MD

## 2019-09-03 NOTE — Unmapped (Signed)
Endocrinology Consult - Follow Up Note    Requesting Attending Physician :  Arnold Long Mir, MD  Service Requesting Consult : Ped Gastroenterology [facility])  Primary Care Provider: [name], MD  Outpatient Endocrinologist: N/A    Assessment/Recommendations:  [name] Gross is a 18 y.o. male who is admitted for Liver transplant rejection (CMS-HCC). Endocrinology have been consulted to evaluate patient for hyperglycemia.    Steroid induced hyperglycemia: A1c 4.9% but with postprandial hyperglycemia. Still on PO prednisone 40mg  daily. He has proven that Lantus 10 units nightly work well for him given the normal FBG. Still having prandial hyperglycemia. Recommend the following regimen:  -Continue Lantus 10 units at bedtime (give 6 units if NPO)  -Increase Lispro to 6 units with meals (Hold if NPO) - ordered as 5x a day to allow for late night meal coverage  -Increase Lispro correction to 2:50>150 with meals and at bedtime    The patient was discussed with attending, Dr. Aleen Campi    Primary team informed of recommendations. We will continue to follow patient as needed. Please call/page [phone] with questions or concerns.     Farrel Conners, MD  [facility] Endocrinology Fellow      History of Present Illness:     Reason for Consult: hyperglycemia.     [name] Gross is a 18 y.o. male who is admitted for liver transplant rejection (CMS-HCC). Endocrinology have been consulted to evaluate patient for hyperglycemia.    Interval history: Still having prandial hyperglycemia though improving. No new complaints today.    Current regimen: Lantus 10 units qhs, Lispro 4u with meals and snacks, lispro 1:50>150 with meals and snacks    Active Orders   Diet    Nutrition Therapy Regular/House     ROS:  As per HPI, otherwise 10 system ROS was negative.      All Medications:   Current Facility-Administered Medications   Medication Dose Route Frequency Provider Last Rate Last Dose   ??? ampicillin-sulbactam dilution (UNASYN) injection 3 g  3 g Intravenous Q6H David Stall, MD   3 g at 09/03/19 1042   ??? benzoyl peroxide 5 % gel   Topical Nightly Alfredia Ferguson, MD       ??? calcium carbonate (OS-CAL) tablet 300 mg elem calcium  300 mg elem calcium Oral BID David Stall, MD   300 mg elem calcium at 09/03/19 1042   ??? cholecalciferol (vitamin D3) tablet 5,000 Units  5,000 Units Oral Daily David Stall, MD   5,000 Units at 09/03/19 0854   ??? clindamycin (CLEOCIN T) 1 % lotion 1 application  1 application Topical daily Alfredia Ferguson, MD   1 application at 09/01/19 1648   ??? dextrose 50 % in water (D50W) 50 % solution 12.5 g  12.5 g Intravenous Q10 Min PRN David Stall, MD       ??? heparin preservative-free injection 10 units/mL syringe (HEPARIN LOCK FLUSH)  20 Units Intravenous Daily PRN David Stall, MD   20 Units at 08/27/19 1524   ??? influenza vaccine quad (FLUARIX, FLULAVAL, FLUZONE) (6 MOS & UP) 2020-21  0.5 mL Intramuscular During hospitalization David Stall, MD       ??? insulin glargine (LANTUS) injection 10 Units  10 Units Subcutaneous Nightly Lovie Chol, MD   10 Units at 09/02/19 2050   ??? insulin lispro (HumaLOG) injection 0-10 Units  0-10 Units Subcutaneous 5XD insulin Lovie Chol, MD   2 Units at 09/03/19  1041   ??? insulin lispro (HumaLOG) injection 6 Units  6 Units Subcutaneous 5XD insulin Beryl Hornberger C Kamela Blansett, MD   6 Units at 09/03/19 1040   ??? ketoconazole (NIZORAL) 2 % shampoo   Topical daily Alfredia Ferguson, MD       ??? magnesium sulfate 2gm/2mL IVPB  2 g Intravenous Once Lewie Loron, MD 25 mL/hr at 09/03/19 1123 2 g at 09/03/19 1123   ??? melatonin tablet 3 mg  3 mg Oral QPM Jonna Clark, MD   3 mg at 09/02/19 2033   ??? multivitamin, with zinc (AQUADEKS) chewable tablet  2 tablet Oral Daily David Stall, MD   2 tablet at 09/03/19 0853   ??? mupirocin (BACTROBAN) 2 % ointment   Topical TID David Stall, MD       ??? mycophenolate (MYFORTIC) EC tablet 540 mg  540 mg Oral BID David Stall, MD   540 mg at 09/03/19 8119   ??? nystatin (MYCOSTATIN) oral suspension  1,000,000 Units Oral TID David Stall, MD   1,000,000 Units at 09/03/19 1478   ??? ondansetron (ZOFRAN) injection 4 mg  4 mg Intravenous Once PRN Purvis Kilts, MD       ??? ondansetron (ZOFRAN-ODT) disintegrating tablet 4 mg  4 mg Oral Q8H PRN David Stall, MD       ??? pantoprazole (PROTONIX) EC tablet 40 mg  40 mg Oral Daily David Stall, MD   40 mg at 09/02/19 2131   ??? phytonadione (vitamin K1) (AQUA-MEPHYTON) 7.5 mg in sodium chloride (NS) 0.9 % 50 mL IVPB  7.5 mg Intravenous Daily Lewie Loron, MD 173.3 mL/hr at 09/03/19 0855 7.5 mg at 09/03/19 0855   ??? polyethylene glycol (MIRALAX) packet 17 g  17 g Oral BID PRN David Stall, MD       ??? potassium & sodium phosphates 250mg  (PHOS-NAK/NEUTRA PHOS) packet 2 packet  2 packet Oral TID Lewie Loron, MD       ??? predniSONE (DELTASONE) tablet 40 mg  40 mg Oral Daily David Stall, MD   40 mg at 09/03/19 0845   ??? promethazine (PHENERGAN) 1 mg/mL pediatric dilution injection 6.25 mg  6.25 mg Intravenous Q15 Min PRN Purvis Kilts, MD       ??? rifAXIMin Burman Blacksmith) tablet 550 mg  550 mg Oral BID David Stall, MD   550 mg at 09/03/19 0853   ??? sodium chloride (NS) 0.9 % infusion   Intravenous Continuous David Stall, MD 10 mL/hr at 08/26/19 0537 1,000 mL at 08/27/19 0731   ??? sodium chloride (NS) 0.9 % infusion  10 mL/hr Intravenous Continuous Ejiofor A Lucky Cowboy., MD 10 mL/hr at 09/03/19 0851 10 mL/hr at 09/03/19 0851   ??? sulfamethoxazole-trimethoprim (BACTRIM) 400-80 mg tablet 80 mg of trimethoprim  1 tablet Oral Q MWF David Stall, MD   80 mg of trimethoprim at 09/02/19 2956   ??? tacrolimus (PROGRAF) capsule 4 mg  4 mg Oral BID David Stall, MD   4 mg at 09/03/19 0855   ??? valGANciclovir (VALCYTE) tablet 450 mg  450 mg Oral Daily David Stall, MD   450 mg at 09/02/19 1608         Objective: :  BP 112/73  - Pulse 91  - Temp 36.9 ??C (Oral)  - Resp 18  - Ht 174.6 cm (5' 8.74)  - Wt 59.2 kg (130 lb 9.6 oz)  - SpO2  100%  - BMI 19.43 kg/m??     Physical Exam:  GEN: alert, oriented, not in acute distress  Resp: no increased WOB  ABD: no distension  PSYCH: flat affect, disengaged  Skin: scattered papules noted    Test Results    Lab Results   Component Value Date    POCGLU 159 09/03/2019    POCGLU 222 (H) 09/02/2019    POCGLU 311 (H) 09/02/2019    POCGLU 134 09/02/2019    POCGLU 257 (H) 09/01/2019    POCGLU 196 (H) 09/01/2019    POCGLU 138 09/01/2019    POCGLU 95 09/01/2019    POCGLU 216 (H) 08/31/2019    POCGLU 219 (H) 08/30/2019     Lab Results   Component Value Date    A1C 5.0 08/17/2019    A1C 4.9 10/13/2018    A1C 7.6 (H) 07/16/2018     Lab Results   Component Value Date    CREATININE 0.78 09/02/2019    NA 136 09/02/2019    K 3.9 09/02/2019    CL 107 09/02/2019    CO2  09/02/2019      Comment:      Specimen icteric.      ANIONGAP  09/02/2019      Comment:      Unable to calculate      CALCIUM 8.0 (L) 09/02/2019    ALBUMIN 2.7 (L) 09/01/2019     Lab Results   Component Value Date    WBC 2.9 (L) 09/01/2019    HGB 9.7 (L) 09/01/2019    PLT 40 (L) 09/01/2019     Lab Results   Component Value Date    AST 142 (H) 09/01/2019    ALT  09/01/2019      Comment:      Icteric specimen.      ALKPHOS 814 (H) 09/01/2019     Lab Results   Component Value Date    TSH 0.797 02/11/2018    FREET4 1.49 02/11/2018     Lab Results   Component Value Date    CHOL 127 08/12/2017    TRIG 49 08/12/2017    HDL 42 08/12/2017    LDL 75 08/12/2017

## 2019-09-03 NOTE — Unmapped (Signed)
Pt VSS and afebrile, on RA. No c/o pain or nausea. Pt received medications as scheduled. Nightly long term insulin given as well as lispro for bs of 222 prior to dinner. PICC c/d/I infusing IVF at [facility] and abx. AML drawn. Pt drinking well, voiding well, 1x bm overnight. No family at bedside overnight. Will ctm and follow POC.     Problem: Wound  Goal: Optimal Wound Healing  Outcome: Progressing     Problem: Fall Injury Risk  Goal: Absence of Fall and Fall-Related Injury  Outcome: Progressing     Problem: Self-Care Deficit  Goal: Improved Ability to Complete Activities of Daily Living  Outcome: Progressing

## 2019-09-03 NOTE — Unmapped (Signed)
Assumed care from Mal, RN. Vital signs stable and patient remained afebrile throughout the shift. Patient medicated with all scheduled medications-see mar for times. No PRN medication given this shift. PICC site clean, dry and intact. Continues to run normal saline at 10 mls and hour without difficulty. Patient voiding and had one BM this shift. Patient continues to tolerate a regular diet and is staying hydrated with PO fluids. Patient BG monitors closely, last bg for this RN was 311, insulin given-see mar for time. Patient out of bed and steady on his feet to chair. Patient quiet today but overall had a great day. All needs are met at this time. Call bell within reach. Will handoff to oncoming nurse.       Problem: Adult Inpatient Plan of Care  Goal: Plan of Care Review  Outcome: Progressing  Goal: Patient-Specific Goal (Individualization)  Outcome: Progressing  Goal: Absence of Hospital-Acquired Illness or Injury  Outcome: Progressing  Goal: Optimal Comfort and Wellbeing  Outcome: Progressing  Goal: Readiness for Transition of Care  Outcome: Progressing  Goal: Rounds/Family Conference  Outcome: Progressing     Problem: Wound  Goal: Optimal Wound Healing  Outcome: Progressing     Problem: Fall Injury Risk  Goal: Absence of Fall and Fall-Related Injury  Outcome: Progressing     Problem: Self-Care Deficit  Goal: Improved Ability to Complete Activities of Daily Living  Outcome: Progressing     Problem: Infection  Goal: Infection Symptom Resolution  Outcome: Progressing

## 2019-09-04 LAB — COMPREHENSIVE METABOLIC PANEL
ALBUMIN: 2.5 g/dL — ABNORMAL LOW (ref 3.5–5.0)
ALKALINE PHOSPHATASE: 796 U/L — ABNORMAL HIGH (ref 65–260)
AST (SGOT): 135 U/L — ABNORMAL HIGH (ref 19–55)
BILIRUBIN TOTAL: 33.1 mg/dL — ABNORMAL HIGH (ref 0.0–1.2)
BUN / CREAT RATIO: 19
CALCIUM: 8.3 mg/dL — ABNORMAL LOW (ref 8.5–10.2)
CHLORIDE: 110 mmol/L — ABNORMAL HIGH (ref 98–107)
CREATININE: 0.83 mg/dL (ref 0.70–1.30)
EGFR CKD-EPI AA MALE: >90 mL/min/{1.73_m2} (ref >=60)
EGFR CKD-EPI NON-AA MALE: >90 mL/min/{1.73_m2} (ref >=60)
POTASSIUM: 4.3 mmol/L (ref 3.5–5.0)
SODIUM: 138 mmol/L (ref 135–145)

## 2019-09-04 LAB — CBC W/ AUTO DIFF
HEMATOCRIT: 29 % — ABNORMAL LOW (ref 41.0–53.0)
MEAN CORPUSCULAR HEMOGLOBIN CONC: 33.3 g/dL (ref 31.0–37.0)
MEAN CORPUSCULAR HEMOGLOBIN: 29.6 pg (ref 26.0–34.0)
MEAN CORPUSCULAR VOLUME: 88.9 fL (ref 80.0–100.0)
MEAN PLATELET VOLUME: 7.9 fL (ref 7.0–10.0)
PLATELET COUNT: 32 10*9/L — ABNORMAL LOW (ref 150–440)
RED BLOOD CELL COUNT: 3.26 10*12/L — ABNORMAL LOW (ref 4.50–5.90)
RED CELL DISTRIBUTION WIDTH: 30.7 % — ABNORMAL HIGH (ref 12.0–15.0)
WBC ADJUSTED: 1.9 10*9/L — ABNORMAL LOW (ref 4.5–11.0)

## 2019-09-04 LAB — MANUAL DIFFERENTIAL
BASOPHILS - REL (DIFF): 0 %
EOSINOPHILS - ABS (DIFF): 0 10*9/L (ref 0.0–0.4)
EOSINOPHILS - REL (DIFF): 0 %
LYMPHOCYTES - REL (DIFF): 7 %
MONOCYTES - REL (DIFF): 9 %
NEUTROPHILS - ABS (DIFF): 1.6 10*9/L — ABNORMAL LOW (ref 2.0–7.5)
NEUTROPHILS - REL (DIFF): 84 %

## 2019-09-04 LAB — TACROLIMUS, TROUGH: Lab: 9.5

## 2019-09-04 LAB — ALBUMIN: Albumin:MCnc:Pt:Ser/Plas:Qn:: 2.5 — ABNORMAL LOW

## 2019-09-04 LAB — MAGNESIUM: Magnesium:MCnc:Pt:Ser/Plas:Qn:: 1.4 — ABNORMAL LOW

## 2019-09-04 LAB — SMEAR REVIEW

## 2019-09-04 LAB — WBC ADJUSTED: Leukocytes:NCnc:Pt:Bld:Qn:: 1.9 — ABNORMAL LOW

## 2019-09-04 LAB — TACROLIMUS LEVEL, TROUGH: TACROLIMUS, TROUGH: 9.5 ng/mL (ref 5.0–15.0)

## 2019-09-04 LAB — GAMMA GLUTAMYL TRANSFERASE: Gamma glutamyl transferase:CCnc:Pt:Ser/Plas:Qn:: 465 — ABNORMAL HIGH

## 2019-09-04 LAB — PHOSPHORUS: Phosphate:MCnc:Pt:Ser/Plas:Qn:: 3.4

## 2019-09-04 LAB — BILIRUBIN DIRECT: Bilirubin.glucuronidated+Bilirubin.albumin bound:MCnc:Pt:Ser/Plas:Qn:: 30.5 — ABNORMAL HIGH

## 2019-09-04 NOTE — Unmapped (Signed)
Pt VSS and afebrile, on RA. No c/o pain or nausea. Pt received medications as scheduled. Nightly glargine insulin given as well as sliding scale correction lispro prior to late night meal. Pt was a bit more withdrawn/irritable this evening and refused 0000 vital signs, did not refuse any other medications/monitoring. PICC c/d/I infusing IVF at [facility] and abx. Pt drinking well, voiding well, 2x bm overnight. No family at bedside overnight. Will ctm and follow POC.          Problem: Adult Inpatient Plan of Care  Goal: Plan of Care Review  Outcome: Progressing     Problem: Wound  Goal: Optimal Wound Healing  Outcome: Progressing     Problem: Fall Injury Risk  Goal: Absence of Fall and Fall-Related Injury  Outcome: Progressing     Problem: Adult Inpatient Plan of Care  Goal: Readiness for Transition of Care  Outcome: Progressing

## 2019-09-04 NOTE — Unmapped (Signed)
VSS and afebrile, on RA. No c/o pain or nausea. Pt received medications as scheduled and tolerated them well. Pt refused two blood sugar checks, and to be on monitors while getting magnesium sulfate, MD notified and peds pharmacy about the policy, was told to monitor his status. Pt had abdominal US @ 1200. Will try to recheck blood sugar before dinner, but other than that he did not refuse any other medications/monitoring. PICC c/d/I infusing IVF at [facility]. Pt drinking well, voiding well, but no BM today. No family at bedside currently. Will continue to monitor.       Problem: Adult Inpatient Plan of Care  Goal: Plan of Care Review  Outcome: Ongoing - Unchanged  Goal: Patient-Specific Goal (Individualization)  Outcome: Ongoing - Unchanged  Goal: Absence of Hospital-Acquired Illness or Injury  Outcome: Ongoing - Unchanged  Goal: Optimal Comfort and Wellbeing  Outcome: Ongoing - Unchanged  Goal: Readiness for Transition of Care  Outcome: Ongoing - Unchanged  Goal: Rounds/Family Conference  Outcome: Ongoing - Unchanged     Problem: Wound  Goal: Optimal Wound Healing  Outcome: Ongoing - Unchanged     Problem: Fall Injury Risk  Goal: Absence of Fall and Fall-Related Injury  Outcome: Ongoing - Unchanged     Problem: Self-Care Deficit  Goal: Improved Ability to Complete Activities of Daily Living  Outcome: Ongoing - Unchanged     Problem: Infection  Goal: Infection Symptom Resolution  Outcome: Ongoing - Unchanged

## 2019-09-04 NOTE — Unmapped (Signed)
Endocrine Virtual Care Note     This patient was not seen in person today. The Diabetes Care Team service has moved to a hybrid virtual model to minimize potential spread of COVID-19, protect patients/providers, reduced PPE utilization and deliver care to more patients.  During this time, we will be limiting person-to-person contact when necessary.     Patients chart, including glucose trends, insulin doses, nutrition, labs and medications have been reviewed. Based on this review we recommend continuing current regimen for now. Please notify us of any changes to nutrition as this will affect glucose values and insulin requirement.     Time spent reviewing chart: 5 minutes    Please page with questions or concerns: Endocrine fellow on call: [phone]    Farrel Conners, MD  Endocrinology Fellow

## 2019-09-04 NOTE — Unmapped (Signed)
Pt afebrile, VSS. No c/o pain or nausea. Eating and drinking very well today. Sliding scale and fixed insulin given with meals. Pt voiding adequately. 1 BM today. Continues to have pitting edema to BLE. Encouraged pt to elevate legs. CHG wipes done. R PICC c/d/I infusing. Pt R ear laceration started bleeding this afternoon, soaked through two dressings before stopping, MD notified. Asked MD about possibly getting CBC with am labs tomorrow to check platelets. Drsg to R ear currently remains c/d/I. Will continue to monitor. No visitors present at bedside today.

## 2019-09-04 NOTE — Unmapped (Signed)
Pediatric Tacrolimus Therapeutic Monitoring Pharmacy Note  ??  [name]??Gross??is a 18 y.o.??male??restarting??tacrolimus.   ??  Indication:??Liver transplant??  ??  Date of Transplant: 09/22/2016??  ??  Current Dosing Information: Tacrolimus??4 mg PO BID  ??  Dosing Weight: 59.2 kg  ??  Goals:  Therapeutic Drug Levels  Tacrolimus trough goal: 8-10 ng/mL per primary team resident and fellow  ??  Additional Clinical Monitoring/Outcomes  ?? Monitor renal function (SCr and urine output) and liver function (LFTs)  ?? Monitor for signs/symptoms of adverse events (e.g., hyperglycemia, hyperkalemia, hypomagnesemia, hypertension, headache, tremor)  ??  Results: Tacrolimus Trough = 9.5 ng/mL  ??  Longitudinal Dose Monitoring:  Date Dose (mg)  AM / PM AM Scr (mg/dL) Level  (ng/mL) Key Drug Interactions   09/04/19 4 mg / - 0.83 9.5 None   09/03/19 4 mg / 4 mg - - None   09/02/19 3.5 mg / 4 mg 0.78 7.5 None   09/01/19 3.5 mg / 3.5 mg 0.91 - None   08/31/19 3 mg / 3.5 mg 0.78 6.2 None   08/30/19 3 mg / 3 mg 0.66 - None   08/29/19 -- / 3 mg 0.63 - None   08/22/19 1.5 mg/ 3 mg 0.79 3.9 None   08/20/19 0.75 mg / 1.5 mg 0.98 2.7 None   08/19/19 0.75 mg / 0.75 mg 0.95 3.4 None   08/18/19 0.5 mg / 0.75 mg 1.13 2.6 None   08/17/19 0.5 mg / 0.5 mg ??1.12 3.4 None   08/16/19 0.25 mg / 0.5 mg 1.09 3.2 None   08/15/19 0.25 mg/0.5 mg 1.04 4.2 None   08/14/19 0.25 mg/0.5 mg 1.06 4.4 None   08/13/19 0.25 mg/0.25 mg 1.28 5.5 None   08/12/19 --- / 0.25 mg 1.25 5.9 None   08/11/19 0.5 mg /??--- ??1.26 ??10.1 ??None   08/10/19 1 mg / HOLD 1.4 10.5 None   08/09/19 1 mg / 1 mg 1.29 --- None   08/08/19 3 mg / HOLD 1.5 11.9 None   08/06/19 5 mg / 3 mg 0.8 9 None   ??  Pharmacokinetic Considerations and Significant Drug Interactions:  ?? Concurrent hepatotoxic medications: None identified  ?? Concurrent CYP3A4 substrates/inhibitors: None identified  ?? Concurrent nephrotoxic medications: Valganciclovir, SMP/TMX  ??  Assessment/Plan:   ?? Trough level therapeutic. Dose was changed recently to 4 mg PO BID. Increase in trough levels was by 2ng/mL in two days. Therefore, would like to repeat trough to assess trends and if dosing adjustments will be needed. No signs of toxicity or decline in renal function at this time.  ??  Recommendedation(s)  ?? Continue tacrolimus dosing of  4 mg BID  ?? Obtain next tacrolimus level in 2 days.  ??  Follow-up  ?? Next level ordered: 09/06/19 at 0730.  ?? A pharmacist will continue to monitor and recommend levels as appropriate  ??  The above plan was discussed and approved by GI team. Spoke with Dr. Margot Chimes  ??  Please page service pharmacist with questions/clarifications.    Annell Greening, PharmD

## 2019-09-04 NOTE — Unmapped (Signed)
Pediatric Daily Progress Note     Assessment/Plan:     Principal Problem:    Liver transplant rejection (CMS-HCC)  Active Problems:    Recurrent major depressive disorder, in partial remission (CMS-HCC)    Malnutrition of mild degree (CMS-HCC)    History of liver transplant (CMS-HCC)    Transaminitis    Normocytic anemia    Hypoalbuminemia    Steroid-induced hyperglycemia    Pityrosporum folliculitis    Abdominal pain    Homeless    Thrombocytopenia (CMS-HCC)    Hypomagnesemia    Hypophosphatasia    Other ascites    MSSA bacteremia    Hyperbilirubinemia    Impetigo lesions of Right Ear and Posterior Neck    Steroid-induced acne    Liver dysfunction secondary to transplant rejection     Irritant contact dermatitis of the hands    Sleep difficulties    Strep Mitis Central line-associated bloodstream infection  Resolved Problems:    Cholangitis of transplanted liver (CMS-HCC)    Melena    Dark stools    [name] a 18 y.o.??male??with history of??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 Along with steroids, tacrolimus, and cellcept, thyroglobulin treatment was initiated (on 10/01-10/12) but yielded limited benefit in liver studies to date. Patient has discussed prognosis with Dr. Melrose Nakayama and palliative care including the possibility of not recovering from liver rejection (10/9). Patient understands that transplant is an option but that he may not be a candidate. His main active issue currently is treating CLABSI secondary to strep mitis and MSSA (found after cultures obtained secondary to leukocytosis on daily labs) with Unasyn for a total of a 14 day course (through 10/27) and salvaging his PICC line for now given difficulty with access. Due to his infection all of his immunosuppressants (thymoblobulin, Cellcept, tacrolimus), were held, and his steroids were weaned. On 10/19, his immunosuppressants were restarted, and repeat liver biopsy was performed and was consistent with chronic rejection. His other active issues are complications secondary to liver dysfunction (hypoalbuminemia, ascites, edema, abdominal pain, elevated INR, electrolyte abnormalities) and subacute lymphopenia and thrombocytopenia (likely due to a combination of myelosuppressive meds and chronic illness, may improve after he finishes Unasyn course). Currently his ascites/edema is stable; we will continue to monitor closely and consider albumin/lasix as needed. He also has steroid-induced hyperglycemia, currently being managed on insulin with daily adjustments.     #Hx Liver Transplant - Subacute/Acute Liver Failure: ??Biopsy confirmed severe acute rejection (9/25), RAI 9/9. Liver labs without a significant improvement after immunosuppression. S/p 12 days of ATG and high dose MethylPrednisolone (10/1-10/12) with poor overall response to therapy. Labs 10/21 with Tbili 32.5 (33.9), Dbili 29.70 (30/.80) , GGT 518 (537) (500).  Liver biopsy completed on 10/21.  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   - Indeterminate for acute cellular rejection, Compared to previous bx (08/05/2019), lymphoplasmacytic infiltrate markedly decreased   -C3D stain sent to [facility]  -Tacro and Mycophenolate restarted 10/20    -Tac 4 mg BID   -Appreciate pharmacy assistance   -Next tac trough 10/27  - Continue prednisone  40mg  today  - Continue Valgancyclovir 450mg  daily (3months), Bactrim MWF (35mo) for prophylaxis (10/2-) and Nystatin 10ml TID (12mo) prophylaxis (10/2-)  - Discontinue Vitamin K (10/15-10/25) given no significant improvement in PT/INR over the last week, consider restarting based on 10/26 PT/INR  - F/u Des-gamma-carboxy-prothrombin- if pt low in vit K, should result as elevated  - Protonix 40 mg PO daily  - Continue Rifaximin   - PT/INR, CBC, CMP on M/Th   - AM D-dimer, factor V/VII, AFP, and ammonia  - 10/21 liver transplant committee determined patient should proceed with complete liver transplant evaluation     #CLABSI: Strep mitis and MSSA   Developed sudden leukocytosis with white count elevated to 17.3 10/12, afebrile, non-toxic appearing, without localizing symptoms. Found to have positive Bcx 10/12 growing strep mitis and MSSA. Given ascites and risk for SBP secondary to strep mitis, also covering for anaerobes. Echo negative for endocarditis. Given patient had low WBC to 2.9 on 10/22, BCx repeated.  - 10/22 BCx NGTD  - CBC M/Th  - 10/12 peripheral/PICC Cx positive for Strep mitis   - 10/12 peripheral - +MSAA; 10/13 - NGTD  - 10/22-  NGTD  - Per ID recs:   - Unasyn x 14 days total (10/13-10/27)   - Given unreliable PIV access, will attempt to salvage his PICC while treating his infection   - Draw new bcx if any repeat cultures turn positive     #Edema, ascites, abdominal pain secondary to liver dysfunction: Ascites still present, stable.   - Holding IVFs (other than PICC KVO), continue to encourage good PO intake  - Following daily weights  - Strict I/Os      #Hypoalbuminemia: Albumin 2.5. Likely contributing to ascites/edema.  - Can consider albumin / lasix    #Nutriton: severe protein-calorie malnutrition. Prior to admission, patient has been skipping meals due to poor appetite and food insecurity. Patient states he has good appetite.  - Continue goal PO fluid intake of 1.6L   - Regular diet +  Ensure supplements PRN  - Vit D3 5,000 units daily  - Aquadex multivitamin    - Ca 300 mg BID    #Electrolyte abnormalities: hypomagnesemia, hypophosphatemia: Mg 1.3 (1.2), 2.9 (2.8)   - qMTh CMP, Mg, Phos  - Urine labs--K 9.1, Phos 23.2 , Mag 3.1,Cr 29.6    -PTH initially read as elevated to 263.1 but now showing invalid result   -F/u PTHrP, Vit D  - Hypomagnesemia   - mag sulphate IV prn for mg<2 (will give dose today)   - given associate with diarrhea, avoid PO magnesium  - Hypophosphatemia    - neutra phos TID      #Constipation: currently recovering from brief episodes of diarrhea, so has not needed miralax  - Miralax 17 g BID PRN     #Steroid induced hyperglycemia:??A1c??4.9% but with postprandial hyperglycemia. Still on PO prednisone 40mg  daily.   -Lantus 10 units at bedtime (give 6 units if NPO)  -Restart nutritional insulin when eating: Lispro 4 units with meals (Hold if NPO)  -Continue PRN Lispro dose for overnight meal: 4 units for normal-large meal, 2 units for smaller meal  -Continue Lispro correction 1:50>150 ACHS  -Endocrine following, appreciate    #Thrombocytopenia & lymphopenia: last plt transfusion 10/21 for plts 40k prior to liver biopsy  - Transplant liver ultrasound w/ doppler today to evaluate portal hypertension & spleen (does not have history of significant splenomegaly)  - Per ID, may improve after finishing unasyn  course (which can cause myelosuppression). Bactrim can also cause myelosuppression, but ID would not recommend alternative agent for PJP ppx other than bactrim.  - CBCd M/Th  - Consider CBCd sooner if bleeding    #Impetigo Skin Lesions on Right Ear and Left posterior Neck, + MSSA, healing well   - Mupirocin ointment for both lesions until healed, per derm    #Steroid-induced acne, possible fungal folliculitis   - topical clindamycin, benzoyl peroxide, Ketoconazole shampoo    ??  #Major Depressive Disorder, insomnia: Stable. Has hx of attempted overdoses, with the last attempt in April 2019??at which point he was hospitalized in the Adolescent Psychiatry unit and discharged on Lexapro 20 mg. ??He has not engaged with mental health resources or taken medication for depression or insomnia in 8-10 months.????Denies SI, passive suicidality, self-harm, or HI at this time.    - Patient saw adult psychiatry team again on 10/22. Offered SSRI, but patient not interested at this time  - Melatonin 3 mg q1800 for sleep and circadian rhythm regulation    #Deconditioning. Patient endorses feeling weak and having limited mobility.   -PT following  -Encourage OOB and ambulation    #Homelessness - Social Concerns:??Arjan has experienced homelessness for ~5 months when he left his mother's house. ??He has severed all ties with his family and states his relationship with his mother cannot be reconciled. ??He also requested contact information of family members to be erased from his chart (this has been completed, but social worker did drop contact info into her note on 9/24) and he more recently has reconciled with some family members. ??In addition to food insecurity, he has not been able to secure medications. ??He is routinely exposed to alcohol and drugs (marijuana, cocaine, etc.) second-hand through his friendships though he denies current use himself. ??On urine drug screen, he tested positive for marijuana only.  -Supportive care following.  -Designated a personal friend [name]) and grandfather to be his HCPOAs (Friend is the Primary).   -Spoke with sister on 10/16 (has not been in regular contact)   -Paternal grandmother now visiting regularly   -Will start discussing potential discharge plan   ??  Access: PICC (placed 9/28), will need removal if he becomes unstable in the setting of CLABSI infection   ??  Discharge criteria:??liver transplant work-up and treatment of acute rejection    Subjective:  No acute events overnight. Bleeding from R ear laceration has stopped.    Objective:     Vital signs in last 24 hours:  Temp:  [36.6 ??C-37 ??C] 36.6 ??C  Heart Rate:  [88-98] 98  Resp:  [18-20] 19  BP: (107-119)/(56-79) 111/69  MAP (mmHg): [71-88] 81  SpO2:  [100 %] 100 %  Intake/Output last 3 shifts:  I/O last 3 completed shifts:  In: 4333.1 [P.O.:3354; I.V.:979.1]  Out: 560 [Urine:560]    Physical Exam:   General:  Lying in bed. Thin appearing. Interactive and conversational   Head: normocephalic, atraumatic.  Eyes: EOMI. Marked scleral icterus present.   Nose:   clear, no discharge  Lungs: clear breath sounds bilaterally. No increased work of breathing. No wheezes/crackles. Good aeration at the bases.   Heart: regular rate and rhythm. No murmurs appreciated on auscultation.  Abdomen:  Abdomen protuberant. Ascites present. No HSM.   Neuro: deconditioned, moves all extremities with decreased strength.   Extremities: non-pitting edema in the LE, WWP  Skin: Skin lesions on left neck, healing well. Skin lesion present on right ear with scab, covered  in gauze that is c/d/i. Acneform rash present on face, back, chest.     Active Medications reviewed and KEY Medications include:     Current Facility-Administered Medications:   ???  ampicillin-sulbactam dilution (UNASYN) injection 3 g, 3 g, Intravenous, Q6H, David Stall, MD, 3 g at 09/04/19 0433  ???  benzoyl peroxide 5 % gel, , Topical, Nightly, Alfredia Ferguson, MD  ???  calcium carbonate (OS-CAL) tablet 300 mg elem calcium, 300 mg elem calcium, Oral, BID, David Stall, MD, 300 mg elem calcium at 09/03/19 2213  ???  cholecalciferol (vitamin D3) tablet 5,000 Units, 5,000 Units, Oral, Daily, David Stall, MD, 5,000 Units at 09/03/19 0854  ???  clindamycin (CLEOCIN T) 1 % lotion 1 application, 1 application, Topical, daily, Alfredia Ferguson, MD, 1 application at 09/03/19 1628  ???  dextrose 50 % in water (D50W) 50 % solution 12.5 g, 12.5 g, Intravenous, Q10 Min PRN, David Stall, MD  ???  heparin preservative-free injection 10 units/mL syringe (HEPARIN LOCK FLUSH), 20 Units, Intravenous, Daily PRN, David Stall, MD, 20 Units at 08/27/19 1524  ???  influenza vaccine quad (FLUARIX, FLULAVAL, FLUZONE) (6 MOS & UP) 2020-21, 0.5 mL, Intramuscular, During hospitalization, David Stall, MD  ???  insulin glargine (LANTUS) injection 10 Units, 10 Units, Subcutaneous, Nightly, Chinelo C Okigbo, MD, 10 Units at 09/03/19 2025  ???  insulin lispro (HumaLOG) injection 0-10 Units, 0-10 Units, Subcutaneous, 5XD insulin, Lovie Chol, MD, 2 Units at 09/03/19 1408  ???  insulin lispro (HumaLOG) injection 6 Units, 6 Units, Subcutaneous, 5XD insulin, Chinelo C Okigbo, MD, 6 Units at 09/04/19 0039  ???  ketoconazole (NIZORAL) 2 % shampoo, , Topical, daily, Alfredia Ferguson, MD  ???  melatonin tablet 3 mg, 3 mg, Oral, QPM, Jonna Clark, MD, 3 mg at 09/03/19 2019  ???  multivitamin, with zinc (AQUADEKS) chewable tablet, 2 tablet, Oral, Daily, David Stall, MD, 2 tablet at 09/03/19 5409  ???  mupirocin (BACTROBAN) 2 % ointment, , Topical, TID, David Stall, MD  ???  mycophenolate (MYFORTIC) EC tablet 540 mg, 540 mg, Oral, BID, David Stall, MD, 540 mg at 09/03/19 2015  ???  nystatin (MYCOSTATIN) oral suspension, 1,000,000 Units, Oral, TID, David Stall, MD, 1,000,000 Units at 09/03/19 2016  ???  ondansetron (ZOFRAN) injection 4 mg, 4 mg, Intravenous, Once PRN, Purvis Kilts, MD  ???  ondansetron (ZOFRAN-ODT) disintegrating tablet 4 mg, 4 mg, Oral, Q8H PRN, David Stall, MD  ???  pantoprazole (PROTONIX) EC tablet 40 mg, 40 mg, Oral, Daily, David Stall, MD, 40 mg at 09/03/19 2213  ???  phytonadione (vitamin K1) (AQUA-MEPHYTON) 7.5 mg in sodium chloride (NS) 0.9 % 50 mL IVPB, 7.5 mg, Intravenous, Daily, Lewie Loron, MD, Last Rate: 173.3 mL/hr at 09/03/19 0855, 7.5 mg at 09/03/19 0855  ???  polyethylene glycol (MIRALAX) packet 17 g, 17 g, Oral, BID PRN, David Stall, MD  ???  potassium & sodium phosphates 250mg  (PHOS-NAK/NEUTRA PHOS) packet 2 packet, 2 packet, Oral, TID, Lewie Loron, MD, 2 packet at 09/03/19 2025  ??? predniSONE (DELTASONE) tablet 40 mg, 40 mg, Oral, Daily, David Stall, MD, 40 mg at 09/03/19 0845  ???  promethazine (PHENERGAN) 1 mg/mL pediatric dilution injection 6.25 mg, 6.25 mg, Intravenous, Q15 Min PRN, Purvis Kilts, MD  ???  rifAXIMin Burman Blacksmith) tablet 550 mg, 550 mg, Oral, BID, David Stall, MD, 550 mg at 09/03/19 2016  ???  sodium chloride (NS) 0.9 % infusion, , Intravenous, Continuous, David Stall, MD, Last Rate: 10 mL/hr at 08/26/19 0537, 1,000 mL at 08/27/19 0731  ???  sodium chloride (NS) 0.9 % infusion, 10 mL/hr, Intravenous, Continuous, Ejiofor A Lucky Cowboy., MD, Last Rate: 10 mL/hr at 09/03/19 0851, 10 mL/hr at 09/03/19 0851  ???  sulfamethoxazole-trimethoprim (BACTRIM) 400-80 mg tablet 80 mg of trimethoprim, 1 tablet, Oral, Q MWF, David Stall, MD, 80 mg of trimethoprim at 09/02/19 4098  ???  tacrolimus (PROGRAF) capsule 4 mg, 4 mg, Oral, BID, David Stall, MD, 4 mg at 09/03/19 2016  ???  valGANciclovir (VALCYTE) tablet 450 mg, 450 mg, Oral, Daily, David Stall, MD, 450 mg at 09/03/19 1626        Studies: Personally reviewed and interpreted.    Labs/Studies:  Labs and Studies from the last 24hrs per EMR reviewed and significant for the following:  CBCd w/ WBC 1.9 (ALC 0.1, ANC 1.6), hgb 9.7, plt 32  CMP w/ Cl 110, Ca 8.3, Mg 1.4, phos 3.4, albumin 2.5, Dbili 30.5, AST 135, Alk phos 796, GGT 465   Tac trough 9.5 (goal 8-10)    Margot Chimes MD  PGY3 Pediatrics  [phone]

## 2019-09-05 LAB — COMPREHENSIVE METABOLIC PANEL
ALBUMIN: 2.5 g/dL — ABNORMAL LOW (ref 3.5–5.0)
ALKALINE PHOSPHATASE: 781 U/L — ABNORMAL HIGH (ref 65–260)
AST (SGOT): 114 U/L — ABNORMAL HIGH (ref 19–55)
BILIRUBIN TOTAL: 32.5 mg/dL — ABNORMAL HIGH (ref 0.0–1.2)
BLOOD UREA NITROGEN: 16 mg/dL (ref 7–21)
BUN / CREAT RATIO: 19
CALCIUM: 8.2 mg/dL — ABNORMAL LOW (ref 8.5–10.2)
CHLORIDE: 105 mmol/L (ref 98–107)
CREATININE: 0.86 mg/dL (ref 0.70–1.30)
EGFR CKD-EPI AA MALE: >90 mL/min/{1.73_m2} (ref >=60)
GLUCOSE RANDOM: 217 mg/dL — ABNORMAL HIGH (ref 70–179)
POTASSIUM: 4.3 mmol/L (ref 3.5–5.0)
SODIUM: 136 mmol/L (ref 135–145)

## 2019-09-05 LAB — CBC W/ AUTO DIFF
BASOPHILS ABSOLUTE COUNT: 0 10*9/L (ref 0.0–0.1)
HEMATOCRIT: 26.3 % — ABNORMAL LOW (ref 41.0–53.0)
HEMOGLOBIN: 8.9 g/dL — ABNORMAL LOW (ref 13.5–17.5)
LARGE UNSTAINED CELLS: 2 % (ref 0–4)
LYMPHOCYTES ABSOLUTE COUNT: 0.2 10*9/L — ABNORMAL LOW (ref 1.5–5.0)
LYMPHOCYTES RELATIVE PERCENT: 14.2 %
MEAN CORPUSCULAR HEMOGLOBIN CONC: 33.8 g/dL (ref 31.0–37.0)
MEAN CORPUSCULAR HEMOGLOBIN: 30.5 pg (ref 26.0–34.0)
MEAN CORPUSCULAR VOLUME: 90.1 fL (ref 80.0–100.0)
MEAN PLATELET VOLUME: 8.1 fL (ref 7.0–10.0)
MONOCYTES ABSOLUTE COUNT: 0.1 10*9/L — ABNORMAL LOW (ref 0.2–0.8)
MONOCYTES RELATIVE PERCENT: 8.1 %
NEUTROPHILS ABSOLUTE COUNT: 1 10*9/L — ABNORMAL LOW (ref 2.0–7.5)
NEUTROPHILS RELATIVE PERCENT: 75.1 %
PLATELET COUNT: 16 10*9/L — ABNORMAL LOW (ref 150–440)
RED BLOOD CELL COUNT: 2.92 10*12/L — ABNORMAL LOW (ref 4.50–5.90)
RED CELL DISTRIBUTION WIDTH: 30.2 % — ABNORMAL HIGH (ref 12.0–15.0)
WBC ADJUSTED: 1.4 10*9/L — ABNORMAL LOW (ref 4.5–11.0)

## 2019-09-05 LAB — ANION GAP: Anion gap 3:SCnc:Pt:Ser/Plas:Qn:: 0

## 2019-09-05 LAB — PHOSPHORUS: Phosphate:MCnc:Pt:Ser/Plas:Qn:: 4

## 2019-09-05 LAB — RETICULOCYTE COUNT PCT: Lab: 2

## 2019-09-05 LAB — SLIDE REVIEW

## 2019-09-05 LAB — RED BLOOD CELL COUNT: Lab: 2.92 — ABNORMAL LOW

## 2019-09-05 LAB — SMEAR REVIEW

## 2019-09-05 LAB — INR: Coagulation tissue factor induced.INR:RelTime:Pt:PPP:Qn:Coag: 1.49

## 2019-09-05 LAB — FACTOR VII ACTIVITY: Lab: 21 — ABNORMAL LOW

## 2019-09-05 LAB — FACTOR V ACTIVITY: Lab: 77

## 2019-09-05 LAB — MAGNESIUM: Magnesium:MCnc:Pt:Ser/Plas:Qn:: 1.4 — ABNORMAL LOW

## 2019-09-05 LAB — DES-G-CARBOXY PT: Acarboxyprothrombin:MCnc:Pt:Ser/Plas:Qn:: 0.4

## 2019-09-05 LAB — RETICULOCYTES
RETICULOCYTE ABSOLUTE COUNT: 59.9 10*9/L (ref 27.0–120.0)
RETICULOCYTE COUNT PCT: 2 % (ref 0.5–2.7)

## 2019-09-05 LAB — D-DIMER QUANTITATIVE (CH,ML,PD,ET): Fibrin D-dimer DDU:MCnc:Pt:PPP:Qn:: 1067 — ABNORMAL HIGH

## 2019-09-05 LAB — AMMONIA: Ammonia:SCnc:Pt:Plas:Qn:: <9 — ABNORMAL LOW

## 2019-09-05 LAB — GAMMA GLUTAMYL TRANSFERASE: Gamma glutamyl transferase:CCnc:Pt:Ser/Plas:Qn:: 442 — ABNORMAL HIGH

## 2019-09-05 NOTE — Unmapped (Signed)
Received referral to verify patients [employer] has been verified.      Patient has active coverage with Medicaid, including prescription drug coverage     Authorization is not required for inpatient/outpatient Liver transplant evaluation.      Patient is financially cleared for Liver transplant evaluation

## 2019-09-05 NOTE — Unmapped (Signed)
Pediatric Daily Progress Note     Assessment/Plan:     Principal Problem:    Liver transplant rejection (CMS-HCC)  Active Problems:    Recurrent major depressive disorder, in partial remission (CMS-HCC)    Malnutrition of mild degree (CMS-HCC)    History of liver transplant (CMS-HCC)    Transaminitis    Normocytic anemia    Hypoalbuminemia    Steroid-induced hyperglycemia    Pityrosporum folliculitis    Abdominal pain    Homeless    Thrombocytopenia (CMS-HCC)    Hypomagnesemia    Hypophosphatasia    Other ascites    MSSA bacteremia    Hyperbilirubinemia    Impetigo lesions of Right Ear and Posterior Neck    Steroid-induced acne    Liver dysfunction secondary to transplant rejection     Irritant contact dermatitis of the hands    Sleep difficulties    Strep Mitis Central line-associated bloodstream infection  Resolved Problems:    Cholangitis of transplanted liver (CMS-HCC)    Melena    Dark stools    Sander??is a 18 y.o.??male??with history of??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 Along with steroids, tacrolimus, and cellcept, thyroglobulin treatment was initiated (on 10/01-10/12) but yielded limited benefit in liver studies to date. Patient has discussed prognosis with Dr. Melrose Nakayama and palliative care including the possibility of not recovering from liver rejection (10/9). Patient understands that transplant is an option but that he may not be a candidate. His main active issue currently is treating CLABSI secondary to strep mitis and MSSA (found after cultures obtained secondary to leukocytosis on daily labs) with Unasyn for a total of a 14 day course (through 10/27) and salvaging his PICC line for now given difficulty with access. Due to his infection all of his immunosuppressants (thymoblobulin, Cellcept, tacrolimus), were held, and his steroids were weaned. On 10/19, his immunosuppressants were restarted, and repeat liver biopsy was performed and was consistent with chronic rejection. His other active issues are complications secondary to liver dysfunction (hypoalbuminemia, ascites, edema, abdominal pain, elevated INR, electrolyte abnormalities) and subacute lymphopenia and thrombocytopenia (likely due to a combination of myelosuppressive meds and chronic illness, may improve after he finishes Unasyn course). Currently his ascites/edema is stable; we will continue to monitor closely and consider albumin/lasix as needed. He also has steroid-induced hyperglycemia, currently being managed on insulin with daily adjustments.     #Hx Liver Transplant - Subacute/Acute Liver Failure: ??Biopsy confirmed severe acute rejection (9/25), RAI 9/9. Liver labs without a significant improvement after immunosuppression. S/p 12 days of ATG and high dose MethylPrednisolone (10/1-10/12) with poor overall response to therapy. Labs 10/26 with D-dimer elevated to 1067, ammonia <9 (46), GGT 442 (465), Tbili 32.5 (33.1).  Liver biopsy completed on 10/21.  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   - Indeterminate for acute cellular rejection, Compared to previous bx (08/05/2019), lymphoplasmacytic infiltrate markedly decreased   -C3D stain sent to [facility]  -Tacro and Mycophenolate restarted 10/20    -Tac 4 mg BID   -Appreciate pharmacy assistance   -Next tac trough 10/27  -  Continue prednisone 40mg  today  - Continue Valgancyclovir 450mg  daily (3months), Bactrim MWF (356mo) for prophylaxis (10/2-) and Nystatin 10ml TID (56mo) prophylaxis (10/2-)  - Discontinue Vitamin K (10/15-10/25) given no significant improvement in PT/INR over the last week   - F/u Des-gamma-carboxy-prothrombin- if pt low in vit K, should result as elevated   - F/u factor V and VII  - Protonix 40 mg PO daily  - Continue Rifaximin   - PT/INR, CBC, CMP on M/Th   - AFP not completed bc sample icteric  - 10/21 liver transplant committee determined patient should proceed with complete liver transplant evaluation     #CLABSI: Strep mitis and MSSA   Developed sudden leukocytosis with white count elevated to 17.3 10/12, afebrile, non-toxic appearing, without localizing symptoms. Found to have positive Bcx 10/12 growing strep mitis and MSSA. Given ascites and risk for SBP secondary to strep mitis, also covering for anaerobes. Echo negative for endocarditis. Given patient had low WBC to 2.9 on 10/22, BCx repeated.  - 10/22 BCx NGTD  - CBC M/Th  - 10/12 peripheral/PICC Cx positive for Strep mitis   - 10/12 peripheral - +MSAA; 10/13 - NGTD  - 10/22-  NGTD  - Per ID recs:   - Unasyn x 14 days total (10/13-10/27), last day tomorrow 10/27   - Given unreliable PIV access, will attempt to salvage his PICC while treating his infection   - Draw new bcx if any repeat cultures turn positive   - **Plan for PICC removal prior to discharge    #Edema, ascites, abdominal pain secondary to liver dysfunction: Ascites still present, stable.   - Albumin 25g, lasix 40 mg today  - Following daily weights  - Strict I/Os      #Hypoalbuminemia: Albumin 2.5. Likely contributing to ascites/edema.  - Albumin 25 g, lasix 40 mg today    #Nutriton: severe protein-calorie malnutrition. Prior to admission, patient has been skipping meals due to poor appetite and food insecurity. Patient states he has good appetite.  - Continue goal PO fluid intake of 1.6L   - Regular diet +  Ensure supplements PRN  - Vit D3 5,000 units daily  - Aquadex multivitamin    - Ca 300 mg BID    #Electrolyte abnormalities: hypomagnesemia, hypophosphatemia: Mg 1.4 (1.3) , 4.0 (3.4)  - qMTh CMP, Mg, Phos  - Urine labs--K 9.1, Phos 23.2 , Mag 3.1,Cr 29.6    -PTH initially read as elevated to 263.1 but now showing invalid result   -F/u PTHrP, Vit D  - Hypomagnesemia   - mag sulphate IV prn for mg<2 (will give dose today) + PO magnesium 280 mg BID   - given associate with diarrhea, avoid PO magnesium  - Hypophosphatemia    - neutra phos TID      #Constipation: currently recovering from brief episodes of diarrhea, so has not needed miralax  - Miralax 17 g BID PRN     #Steroid induced hyperglycemia:??A1c??4.9% but with postprandial hyperglycemia. Still on PO prednisone 40mg  daily.   -Lantus 10 units at bedtime (give 6 units if NPO)  -Restart nutritional insulin when eating: Lispro 4 units with meals (Hold if NPO)  -Continue PRN Lispro dose for overnight meal: 4 units for normal-large meal, 2 units for smaller meal  -Continue Lispro correction 1:50>150 ACHS  -Endocrine following, appreciate    #Thrombocytopenia & lymphopenia: last plt transfusion 10/21 for plts 40k prior to liver biopsy  - Transplant liver ultrasound w/ doppler today to evaluate portal  hypertension & spleen (does not have history of significant splenomegaly)  - Per ID, may improve after finishing unasyn course (which can cause myelosuppression). Bactrim can also cause myelosuppression, but ID would not recommend alternative agent for PJP ppx other than bactrim.  - CBCd M/Th  - Consider CBCd sooner if bleeding    #Impetigo Skin Lesions on Right Ear and Left posterior Neck, + MSSA, healing well   - Mupirocin ointment for both lesions until healed, per derm    #Steroid-induced acne, possible fungal folliculitis   - topical clindamycin, benzoyl peroxide, Ketoconazole shampoo    ??  #Major Depressive Disorder, insomnia: Stable. Has hx of attempted overdoses, with the last attempt in April 2019??at which point he was hospitalized in the Adolescent Psychiatry unit and discharged on Lexapro 20 mg. ??He has not engaged with mental health resources or taken medication for depression or insomnia in 8-10 months.????Denies SI, passive suicidality, self-harm, or HI at this time.    - Patient saw adult psychiatry team again on 10/22. Offered SSRI, but patient not interested at this time  - Melatonin 3 mg q1800 for sleep and circadian rhythm regulation    #Deconditioning. Patient endorses feeling weak and having limited mobility.   -PT following  -Encourage OOB and ambulation    #Homelessness - Social Concerns:??Jahmire has experienced homelessness for ~5 months when he left his mother's house. ??He has severed all ties with his family and states his relationship with his mother cannot be reconciled. ??He also requested contact information of family members to be erased from his chart (this has been completed, but social worker did drop contact info into her note on 9/24) and he more recently has reconciled with some family members. ??In addition to food insecurity, he has not been able to secure medications. ??He is routinely exposed to alcohol and drugs (marijuana, cocaine, etc.) second-hand through his friendships though he denies current use himself. ??On urine drug screen, he tested positive for marijuana only.  -Supportive care following.  -Designated a personal friend [name]) and grandfather to be his HCPOAs (Friend is the Primary).   -Spoke with sister on 10/16 (has not been in regular contact)   -Paternal grandmother now visiting regularly   -Will start discussing potential discharge plan   - Appreciate palliative care in assisting with discharge plan, deciphering where patient will live  ??  Access: PICC (placed 9/28), will need removal prior to discharge  ??  Discharge criteria:??liver transplant work-up and treatment of acute rejection    Subjective:  No acute events overnight. Patient's dad and grandma visited last night.     Objective:     Vital signs in last 24 hours:  Temp:  [36.8 ??C-37 ??C] 37 ??C  Heart Rate:  [78-92] 78  Resp:  [17-20] 18  BP: (97-117)/(63-81) 97/63  MAP (mmHg):  [74-90] 74  SpO2:  [100 %] 100 %  Intake/Output last 3 shifts:  I/O last 3 completed shifts:  In: 3649.2 [P.O.:3000; I.V.:649.2]  Out: 1160 [Urine:1160]    Physical Exam:   General:  Lying in bed. Thin appearing. Interactive and conversational   Head: normocephalic, atraumatic.  Eyes: EOMI. Marked scleral icterus present.   Nose:   clear, no discharge  Lungs: clear breath sounds bilaterally. No increased work of breathing. No wheezes/crackles. Good aeration at the bases.   Heart: regular rate and rhythm. No murmurs appreciated on auscultation.  Abdomen:  Abdomen protuberant. Ascites present. No HSM.   Neuro: deconditioned, moves all extremities with decreased  strength.   Extremities: non-pitting edema in the LE, WWP  Skin: Skin lesions on left neck, healing well. Skin lesion present on right ear with scab, covered in gauze that is c/d/i. Acneform rash present on face, back, chest.     Active Medications reviewed and KEY Medications include:     Current Facility-Administered Medications:   ???  ampicillin-sulbactam dilution (UNASYN) injection 3 g, 3 g, Intravenous, Q6H, Darden Dates Gutierrez-Wu, MD, 3 g at 09/05/19 1101  ???  benzoyl peroxide 5 % gel, , Topical, Nightly, Alfredia Ferguson, MD  ???  calcium carbonate (OS-CAL) tablet 300 mg elem calcium, 300 mg elem calcium, Oral, BID, David Stall, MD, 300 mg elem calcium at 09/05/19 1100  ???  cholecalciferol (vitamin D3) tablet 5,000 Units, 5,000 Units, Oral, Daily, David Stall, MD, 5,000 Units at 09/05/19 [phone]  ???  clindamycin (CLEOCIN T) 1 % lotion 1 application, 1 application, Topical, daily, Alfredia Ferguson, MD, 1 application at 09/03/19 1628  ???  dextrose 50 % in water (D50W) 50 % solution 12.5 g, 12.5 g, Intravenous, Q10 Min PRN, David Stall, MD  ???  furosemide (LASIX) injection 40 mg, 40 mg, Intravenous, Once, Alfredia Ferguson, MD  ???  heparin preservative-free injection 10 units/mL syringe (HEPARIN LOCK FLUSH), 20 Units, Intravenous, Daily PRN, David Stall, MD, 20 Units at 08/27/19 1524  ???  influenza vaccine quad (FLUARIX, FLULAVAL, FLUZONE) (6 MOS & UP) 2020-21, 0.5 mL, Intramuscular, During hospitalization, David Stall, MD  ???  insulin glargine (LANTUS) injection 10 Units, 10 Units, Subcutaneous, Nightly, Lovie Chol, MD, 10 Units at 09/04/19 2102  ???  insulin lispro (HumaLOG) injection 0-10 Units, 0-10 Units, Subcutaneous, 5XD insulin, Lovie Chol, MD, 2 Units at 09/05/19 1209  ???  insulin lispro (HumaLOG) injection 6 Units, 6 Units, Subcutaneous, 5XD insulin, Lovie Chol, MD, 6 Units at 09/05/19 1209  ???  ketoconazole (NIZORAL) 2 % shampoo, , Topical, daily, Alfredia Ferguson, MD  ???  magnesium oxide (MAG-OX) capsule 280 mg, 280 mg, Oral, BID, Alfredia Ferguson, MD, 280 mg at 09/05/19 1300  ???  melatonin tablet 3 mg, 3 mg, Oral, QPM, Jonna Clark, MD, 3 mg at 09/04/19 2057  ???  multivitamin, with zinc (AQUADEKS) chewable tablet, 2 tablet, Oral, Daily, David Stall, MD, 2 tablet at 09/05/19 0850  ???  mupirocin (BACTROBAN) 2 % ointment, , Topical, TID, David Stall, MD  ???  mycophenolate (MYFORTIC) EC tablet 540 mg, 540 mg, Oral, BID, David Stall, MD, 540 mg at 09/05/19 1140  ???  nystatin (MYCOSTATIN) oral suspension, 1,000,000 Units, Oral, TID, David Stall, MD, 1,000,000 Units at 09/05/19 0848  ???  ondansetron (ZOFRAN) injection 4 mg, 4 mg, Intravenous, Once PRN, Purvis Kilts, MD  ???  ondansetron (ZOFRAN-ODT) disintegrating tablet 4 mg, 4 mg, Oral, Q8H PRN, David Stall, MD  ???  pantoprazole (PROTONIX) EC tablet 40 mg, 40 mg, Oral, Daily, David Stall, MD, 40 mg at 09/04/19 2207  ???  polyethylene glycol (MIRALAX) packet 17 g, 17 g, Oral, BID PRN, David Stall, MD  ???  potassium & sodium phosphates 250mg  (PHOS-NAK/NEUTRA PHOS) packet 2 packet, 2 packet, Oral, TID, Lewie Loron, MD, 2 packet at 09/05/19 0848  ???  predniSONE (DELTASONE) tablet 40 mg, 40 mg, Oral, Daily, David Stall, MD, 40 mg at 09/05/19 0854  ???  promethazine (PHENERGAN) 1 mg/mL pediatric dilution injection 6.25 mg, 6.25 mg, Intravenous, Q15  Min PRN, Purvis Kilts, MD  ???  rifAXIMin [employer]) tablet 550 mg, 550 mg, Oral, BID, David Stall, MD, 550 mg at 09/05/19 0851  ???  sodium chloride (NS) 0.9 % infusion, , Intravenous, Continuous, David Stall, MD, Last Rate: 10 mL/hr at 08/26/19 0537, 1,000 mL at 08/27/19 0731  ???  sodium chloride (NS) 0.9 % infusion, 10 mL/hr, Intravenous, Continuous, Ejiofor A Lucky Cowboy., MD, Last Rate: 10 mL/hr at 09/04/19 1013, 10 mL/hr at 09/04/19 1013  ???  sulfamethoxazole-trimethoprim (BACTRIM) 400-80 mg tablet 80 mg of trimethoprim, 1 tablet, Oral, Q MWF, David Stall, MD, 80 mg of trimethoprim at 09/05/19 0849  ???  tacrolimus (PROGRAF) capsule 4 mg, 4 mg, Oral, BID, David Stall, MD, 4 mg at 09/05/19 0908  ???  valGANciclovir (VALCYTE) tablet 450 mg, 450 mg, Oral, Daily, David Stall, MD, 450 mg at 09/04/19 1642        Studies: Personally reviewed and interpreted.    Labs/Studies:  Lab Results   Component Value Date    WBC 1.4 (L) 09/05/2019    HGB 8.9 (L) 09/05/2019    HCT 26.3 (L) 09/05/2019    PLT 16 (L) 09/05/2019       Lab Results   Component Value Date    NA 136 09/05/2019    K 4.3 09/05/2019    CL 105 09/05/2019    CO2  09/05/2019      Comment:      Specimen icteric.    BUN 16 09/05/2019    CREATININE 0.86 09/05/2019    GLU 217 (H) 09/05/2019    CALCIUM 8.2 (L) 09/05/2019    MG 1.4 (L) 09/05/2019    PHOS 4.0 09/05/2019       Lab Results   Component Value Date    BILITOT 32.5 (H) 09/05/2019    BILIDIR 30.50 (H) 09/04/2019    PROT  09/05/2019      Comment:      Specimen icteric.        ALBUMIN 2.5 (L) 09/05/2019    ALT  09/05/2019      Comment:      Specimen icteric.    AST 114 (H) 09/05/2019    ALKPHOS 781 (H) 09/05/2019    GGT 442 (H) 09/05/2019       Lab Results   Component Value Date    PT 17.3 (H) 09/05/2019    INR 1.49 09/05/2019    APTT 25.0 (L) 08/14/2019         Jonna Clark, MD  Anesthesiology - PGY-1  Pager: [phone]

## 2019-09-05 NOTE — Unmapped (Signed)
[name] Gross tolerating POC this shift. Pt with adequate liquid PO intake; pt continuing to refuse BS checks and insulin d/t not being hungry. IV abx given as ordered, labs to be drawn this am. Grandmother and grandfather at bedside at start of shift. VSS, will continue with plan of care.   Problem: Adult Inpatient Plan of Care  Goal: Plan of Care Review  Outcome: Progressing  Goal: Patient-Specific Goal (Individualization)  Outcome: Progressing  Goal: Absence of Hospital-Acquired Illness or Injury  Outcome: Progressing  Goal: Optimal Comfort and Wellbeing  Outcome: Progressing  Goal: Readiness for Transition of Care  Outcome: Progressing  Goal: Rounds/Family Conference  Outcome: Progressing     Problem: Wound  Goal: Optimal Wound Healing  Outcome: Progressing     Problem: Fall Injury Risk  Goal: Absence of Fall and Fall-Related Injury  Outcome: Progressing     Problem: Self-Care Deficit  Goal: Improved Ability to Complete Activities of Daily Living  Outcome: Progressing     Problem: Infection  Goal: Infection Symptom Resolution  Outcome: Progressing

## 2019-09-05 NOTE — Unmapped (Signed)
Endocrinology Consult - Follow Up Note    Requesting Attending Physician :  Arnold Long Mir, MD  Service Requesting Consult : Ped Gastroenterology [facility])  Primary Care Provider: [name], MD  Outpatient Endocrinologist: N/A    Assessment/Recommendations:  [name] Gross is a 18 y.o. male with steroid induced hyperglycemia who is admitted for Liver transplant rejection (CMS-HCC). Endocrinology have been consulted to evaluate patient for hyperglycemia.    Steroid induced hyperglycemia: A1c 4.9% but with postprandial hyperglycemia. Still on PO prednisone 40mg  daily. Patient is currently refusing BG checks and some insulin doses. Discussed need for allowing insulin doses. Recommend the continuing the current regimen:  -Continue Lantus 10 units at bedtime (give 6 units if NPO)  -Continue Lispro 6 units with meals (Hold if NPO) - ordered as 5x a day to allow for late night meal coverage  -Continue Lispro correction to 2:50>150 with meals and at bedtime  -Encouraged to allow regular BG checks to help with glycemic control    The patient was discussed with attending, Dr. Marcello Fennel    We will continue to follow patient as needed. Please call/page [phone] with questions or concerns.     Farrel Conners, MD  [facility] Endocrinology Fellow    Time spent reviewing chart 10 minutes  Time spent with fellow 10 minutes        History of Present Illness:     Reason for Consult: hyperglycemia.     [name] Gross is a 18 y.o. male who is admitted for liver transplant rejection (CMS-HCC). Endocrinology have been consulted to evaluate patient for hyperglycemia.    Interval history: Patient was reportedly refused BG checks and insulin doses yesterday. Patient did not want to take insulin as he had poor appetite yesterday. Will continue current regimen as it worked when he received it.     Current regimen: Lantus 10 units qhs, Lispro 6u with meals and snacks, lispro 2:50>150 with meals and snacks    Active Orders   Diet Nutrition Therapy Regular/House     ROS:  As per HPI, otherwise 10 system ROS was negative.      All Medications:   Current Facility-Administered Medications   Medication Dose Route Frequency Provider Last Rate Last Dose   ??? ampicillin-sulbactam dilution (UNASYN) injection 3 g  3 g Intravenous Q6H David Stall, MD   3 g at 09/05/19 0413   ??? benzoyl peroxide 5 % gel   Topical Nightly Alfredia Ferguson, MD       ??? calcium carbonate (OS-CAL) tablet 300 mg elem calcium  300 mg elem calcium Oral BID David Stall, MD   300 mg elem calcium at 09/04/19 2207   ??? cholecalciferol (vitamin D3) tablet 5,000 Units  5,000 Units Oral Daily David Stall, MD   5,000 Units at 09/05/19 [phone]   ??? clindamycin (CLEOCIN T) 1 % lotion 1 application  1 application Topical daily Alfredia Ferguson, MD   1 application at 09/03/19 1628   ??? dextrose 50 % in water (D50W) 50 % solution 12.5 g  12.5 g Intravenous Q10 Min PRN David Stall, MD       ??? heparin preservative-free injection 10 units/mL syringe (HEPARIN LOCK FLUSH)  20 Units Intravenous Daily PRN David Stall, MD   20 Units at 08/27/19 1524   ??? influenza vaccine quad (FLUARIX, FLULAVAL, FLUZONE) (6 MOS & UP) 2020-21  0.5 mL Intramuscular During hospitalization David Stall, MD       ???  insulin glargine (LANTUS) injection 10 Units  10 Units Subcutaneous Nightly Lovie Chol, MD   10 Units at 09/04/19 2102   ??? insulin lispro (HumaLOG) injection 0-10 Units  0-10 Units Subcutaneous 5XD insulin Lovie Chol, MD   2 Units at 09/04/19 1852   ??? insulin lispro (HumaLOG) injection 6 Units  6 Units Subcutaneous 5XD insulin Lovie Chol, MD   6 Units at 09/04/19 1851   ??? ketoconazole (NIZORAL) 2 % shampoo   Topical daily Alfredia Ferguson, MD       ??? melatonin tablet 3 mg  3 mg Oral QPM Jonna Clark, MD   3 mg at 09/04/19 2057   ??? multivitamin, with zinc (AQUADEKS) chewable tablet  2 tablet Oral Daily David Stall, MD   2 tablet at 09/05/19 0850   ??? mupirocin (BACTROBAN) 2 % ointment   Topical TID David Stall, MD       ??? mycophenolate (MYFORTIC) EC tablet 540 mg  540 mg Oral BID David Stall, MD   540 mg at 09/04/19 2059   ??? nystatin (MYCOSTATIN) oral suspension  1,000,000 Units Oral TID David Stall, MD   1,000,000 Units at 09/05/19 0848   ??? ondansetron (ZOFRAN) injection 4 mg  4 mg Intravenous Once PRN Purvis Kilts, MD       ??? ondansetron (ZOFRAN-ODT) disintegrating tablet 4 mg  4 mg Oral Q8H PRN David Stall, MD       ??? pantoprazole (PROTONIX) EC tablet 40 mg  40 mg Oral Daily David Stall, MD   40 mg at 09/04/19 2207   ??? polyethylene glycol (MIRALAX) packet 17 g  17 g Oral BID PRN David Stall, MD       ??? potassium & sodium phosphates 250mg  (PHOS-NAK/NEUTRA PHOS) packet 2 packet  2 packet Oral TID Lewie Loron, MD   2 packet at 09/05/19 0848   ??? predniSONE (DELTASONE) tablet 40 mg  40 mg Oral Daily David Stall, MD   40 mg at 09/05/19 0854   ??? promethazine (PHENERGAN) 1 mg/mL pediatric dilution injection 6.25 mg  6.25 mg Intravenous Q15 Min PRN Purvis Kilts, MD       ??? rifAXIMin Burman Blacksmith) tablet 550 mg  550 mg Oral BID David Stall, MD   550 mg at 09/05/19 0851   ??? sodium chloride (NS) 0.9 % infusion   Intravenous Continuous David Stall, MD 10 mL/hr at 08/26/19 0537 1,000 mL at 08/27/19 0731   ??? sodium chloride (NS) 0.9 % infusion  10 mL/hr Intravenous Continuous Ejiofor A Lucky Cowboy., MD 10 mL/hr at 09/04/19 1013 10 mL/hr at 09/04/19 1013   ??? sulfamethoxazole-trimethoprim (BACTRIM) 400-80 mg tablet 80 mg of trimethoprim  1 tablet Oral Q MWF David Stall, MD   80 mg of trimethoprim at 09/05/19 0849   ??? tacrolimus (PROGRAF) capsule 4 mg  4 mg Oral BID David Stall, MD   4 mg at 09/05/19 0908   ??? valGANciclovir (VALCYTE) tablet 450 mg  450 mg Oral Daily David Stall, MD   450 mg at 09/04/19 1642         Objective: :  BP (P) 115/79  - Pulse (P) 88  - Temp 36.8 ??C  - Resp (P) 17  - Ht 174.6 cm (5' 8.74)  - Wt 59.2 kg (130 lb 8.9 oz)  - SpO2 (P) 100%  - BMI 19.43 kg/m??  Physical Exam:  GEN: alert, oriented, not in acute distress  Resp: no increased WOB  ABD: no distension  PSYCH: flat affect, disengaged  Skin: scattered papules noted    Test Results    Lab Results   Component Value Date    POCGLU 196 (H) 09/04/2019    POCGLU 128 09/04/2019    POCGLU 157 09/03/2019    POCGLU 159 09/03/2019    POCGLU 222 (H) 09/02/2019    POCGLU 311 (H) 09/02/2019    POCGLU 134 09/02/2019    POCGLU 257 (H) 09/01/2019    POCGLU 196 (H) 09/01/2019    POCGLU 138 09/01/2019     Lab Results   Component Value Date    A1C 5.0 08/17/2019    A1C 4.9 10/13/2018    A1C 7.6 (H) 07/16/2018     Lab Results   Component Value Date    CREATININE 0.86 09/05/2019    NA 136 09/05/2019    K 4.3 09/05/2019    CL 105 09/05/2019    CO2  09/05/2019      Comment:      Specimen icteric.    ANIONGAP  09/05/2019      Comment:      Unable to calculate    CALCIUM 8.2 (L) 09/05/2019    ALBUMIN 2.5 (L) 09/05/2019     Lab Results   Component Value Date    WBC 1.4 (L) 09/05/2019    HGB 8.9 (L) 09/05/2019    PLT 16 (L) 09/05/2019     Lab Results   Component Value Date    AST 114 (H) 09/05/2019    ALT  09/05/2019      Comment:      Specimen icteric.    ALKPHOS 781 (H) 09/05/2019     Lab Results   Component Value Date    TSH 0.797 02/11/2018    FREET4 1.49 02/11/2018     Lab Results   Component Value Date    CHOL 127 08/12/2017    TRIG 49 08/12/2017    HDL 42 08/12/2017    LDL 75 08/12/2017

## 2019-09-05 NOTE — Unmapped (Signed)
VSS and afebrile, on RA. No c/o pain or nausea. Pt received medications as scheduled and tolerated them well. Pt refused all blood sugar checks but the one around 1200. Pt had consultations with more specialties today, some medications have been changed (see MAR). Pt was on albumin today followed by lasix, he tolerated both of these medications well. PICC c/d/I infusing IVF at [facility]. Pt drinking well, voiding well, and had one BM today. No family at bedside currently. Will continue to monitor.       Problem: Adult Inpatient Plan of Care  Goal: Plan of Care Review  Outcome: Ongoing - Unchanged  Goal: Patient-Specific Goal (Individualization)  Outcome: Ongoing - Unchanged  Goal: Absence of Hospital-Acquired Illness or Injury  Outcome: Ongoing - Unchanged  Goal: Optimal Comfort and Wellbeing  Outcome: Ongoing - Unchanged  Goal: Readiness for Transition of Care  Outcome: Ongoing - Unchanged  Goal: Rounds/Family Conference  Outcome: Ongoing - Unchanged     Problem: Wound  Goal: Optimal Wound Healing  Outcome: Ongoing - Unchanged     Problem: Fall Injury Risk  Goal: Absence of Fall and Fall-Related Injury  Outcome: Ongoing - Unchanged     Problem: Self-Care Deficit  Goal: Improved Ability to Complete Activities of Daily Living  Outcome: Ongoing - Unchanged     Problem: Infection  Goal: Infection Symptom Resolution  Outcome: Ongoing - Unchanged

## 2019-09-05 NOTE — Unmapped (Signed)
Follow-up Pediatric Palliative Care Consult Note:  ??  [name] Gross is a 18 y.o. male seen for pediatric palliative care consultation at the request of [name] Gross for goals of care, advance care planning, and family support.  ??  Primary Care Provider: [name], MD  ??  History provided by: Patient  ??  Assessment:  ??  [name] Gross is a 18 y.o. 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  After receiving treatment, his most recent liver biopsy on 10/21 showed results consistent with chronic rejection.  [name] Gross was encouraged to take measures to improve his candidacy for liver transplant, including addressing social barriers and receiving mental health counseling as he will likely need a transplant in the future.  He has a transplant meeting currently scheduled for 10/27.  Discharge is anticipated within the next 1-2 weeks as [name] Gross' condition has now stabilized.  During this admission, [name] Gross identified surrogate decision makers (he does not wish for this to default to next of kin) because he was at risk for further decompensation/further decline in health.  Additionally, [name] Gross is actively completing Voicing My Choices ACP guide to help communicate his wishes to family, designated decision makers, and his medical team.   Palliative care consult requested for goals of care, advance care planning, and family support.     ??  Summary of Discussion and Recommendations:   ??  1. SYMPTOMS:   - S/p liver biopsy on 10/21.  Results consistent with chronic rejection.    - [name] Gross denied denied pain, nausea, poor appetite, insomnia, or decreased mood.  - [name] Gross revealed that he had previously been diagnosed with bipolar disorder.  He says that he is now open to outpatient therapy and medication to support his mental health.  ??  2. GOALS:   - [name] Gross hopes to make a recovery, secure a place to live, and find a job.   - [name] Gross has long term goals which include going back to school and becoming a Runner, broadcasting/film/video or nurse.   ??  3. DECISIONS:   - Given risk of hepatic encephalopathy, and general risk of his condition deteriorating, recommended [name] Gross think about who he would want to be his surrogate decision maker if he was unable to make decisions for himself (see ACP below)   - [name] Gross likes to receive information directly.   - Dr. Melrose Gross has reviewed with [name] Gross that redo liver transplant can be an option for some people. [name] Gross acknowledged knowing that he may not be a candidate and that he doesn't know if he would choose to pursue another transplant. He is awaiting more conversations about this.   - [name] Gross Children's Decision Making Tool given and explained at a previous visit. He was encouraged to use this tool along with an ACP guide to document information he receives from providers.   ??  4. ADVANCE CARE PLANNING  - [name] Gross has designated his friend [name] Gross and grandfather as [name].  - Code status: Full code. Prognosis: Guarded given severity of liver disease and potential for lack of response to salvage therapies, AKI, and complex social situation with lack of caregiver support.  ??  5. SUPPORT:   - [name] Gross describes limited personal supports including an aunt and maternal grandparents, and friends. He has not spoken to any family since being admitted to the hospital. He has reached out to two friends who  have been good supports throughout his illness  - [name] Gross agreed to begin SSI application as suggested by transplant social worker, as the process could take months to complete.  He has since decided to wait until he was on a secure network instead of the an open/public network at the hospital due to security concerns.  He was also told how to access his medical records.  ??  6. CARE COORDINATION:   - Care coordinated with GI team and nursing.         Total time spent with patient for evaluation & management (excluding ACP documented separately): 25 minutes.  Greater than 50% of this time spent on counseling/coordination of care: Yes.  See ACP Note from today for additional billable service: Yes.     We appreciate the consult. The Children's Supportive Care Team can be reached by pager [name]) or email [email or web address]).   ??  ??  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 His hospital course has been complicated by AKI, significant ascites. His most recent liver biopsy on 10/21 showed chronic rejection. He is currently stable enough for discharge but will likely need transplant in the future.    ??  Interval events: Liver biopsy on 10/21 showed chronic rejection.  Meeting with transplant team scheduled for 10/27.  Paternal grandmother, [name] Gross, will be present for meeting.     [name] Gross reported that he was aware of biopsy results and the team felt he was stable enough for discharge.  He stated that his paternal grandmother would be spending the night with him and be present for his transplant evaluation tomorrow.  [name] stated that he was hoping to discharge home with paternal grandparents but had not made that request known to them.  We suggested that he share this information with his grandmother when she arrived this evening.    Allergies:  Bee pollen and Pollen extracts  ??  Medications:  Scheduled Meds:  ??? ampicillin-sulbactam  3 g Intravenous Q6H   ??? benzoyl peroxide   Topical Nightly   ??? calcium carbonate  300 mg elem calcium Oral BID   ??? cholecalciferol (vitamin D3)  5,000 Units Oral Daily   ??? clindamycin  1 application Topical daily   ??? furosemide  40 mg Intravenous Once   ??? flu vacc [id number] 6mos up(PF)  0.5 mL Intramuscular During hospitalization   ??? insulin glargine  10 Units Subcutaneous Nightly   ??? insulin lispro  0-10 Units Subcutaneous 5XD insulin   ??? insulin lispro  6 Units Subcutaneous 5XD insulin   ??? ketoconazole   Topical daily   ??? magnesium oxide  280 mg Oral BID   ??? melatonin  3 mg Oral QPM   ??? multivitamin, with zinc  2 tablet Oral Daily   ??? mupirocin   Topical TID   ??? mycophenolate  540 mg Oral BID   ??? nystatin  1,000,000 Units Oral TID   ??? pantoprazole  40 mg Oral Daily   ??? potassium & sodium phosphates 250mg   2 packet Oral TID   ??? predniSONE  40 mg Oral Daily   ??? rifAXIMin  550 mg Oral BID   ??? sulfamethoxazole-trimethoprim  1 tablet Oral Q MWF   ??? tacrolimus  4 mg Oral BID   ???  valGANciclovir  450 mg Oral Daily     Continuous Infusions:  ??? sodium chloride 500 mL (08/26/19 0537)   ??? sodium chloride 10 mL/hr (09/04/19 1013)     PRN Meds:.dextrose 50 % in water (D50W), heparin, porcine (PF), ondansetron, ondansetron, polyethylen glycol, promethazine    Physical Exam:   Vitals:    09/05/19 1230   BP: 97/63   Pulse: 78   Resp: 18   Temp: 37 ??C (98.6 ??F)   SpO2: 100%   ??  Gen: thin young man lying in bed, initially with linen covering head, NAD.  Skin: jaundiced.  HEENT: +scleral icterus.   Chest: normal work of breathing  Abd: protruberant  Psych: more reserved today but willingly answered questions and receptive to suggestions.   ??  Data: Reviewed test results in Epic.    La Lindwood Qua, [place], CPNP  Children's Supportive Care Team  [phone] (pager)        Collaborating Physician Attestation    I was the supervising physician in the delivery of the service. I discussed Dael's case at length with [facility], CPNP and was involved with making the recommendations contained in this note.     Waldon Reining, MD, MPH  Attending Physician, Children's Supportive Care Team

## 2019-09-06 LAB — GAMMA GLUTAMYL TRANSFERASE: Gamma glutamyl transferase:CCnc:Pt:Ser/Plas:Qn:: 389 — ABNORMAL HIGH

## 2019-09-06 LAB — TACROLIMUS, TROUGH: Lab: 7.7

## 2019-09-06 NOTE — Unmapped (Signed)
Pediatric Daily Progress Note     Assessment/Plan:     Principal Problem:    Liver transplant rejection (CMS-HCC)  Active Problems:    Recurrent major depressive disorder, in partial remission (CMS-HCC)    Malnutrition of mild degree (CMS-HCC)    History of liver transplant (CMS-HCC)    Transaminitis    Normocytic anemia    Hypoalbuminemia    Steroid-induced hyperglycemia    Pityrosporum folliculitis    Abdominal pain    Homeless    Thrombocytopenia (CMS-HCC)    Hypomagnesemia    Hypophosphatasia    Other ascites    MSSA bacteremia    Hyperbilirubinemia    Impetigo lesions of Right Ear and Posterior Neck    Steroid-induced acne    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 Along with steroids, tacrolimus, and cellcept, thyroglobulin treatment was initiated (on 10/01-10/12) but yielded limited benefit in liver studies to date. Patient has discussed prognosis with Dr. Melrose Nakayama and palliative care including the possibility of not recovering from liver rejection (10/9). Patient understands that transplant is an option but that he may not be a candidate. His main active issue currently is treating CLABSI secondary to strep mitis and MSSA (found after cultures obtained secondary to leukocytosis on daily labs) with Unasyn for a total of a 14 day course (through 10/27) and salvaging his PICC line for now given difficulty with access. Due to his infection all of his immunosuppressants (thymoblobulin, Cellcept, tacrolimus), were held, and his steroids were weaned. On 10/19, his immunosuppressants were restarted, and repeat liver biopsy was performed and was consistent with chronic rejection. His other active issues are complications secondary to liver dysfunction (hypoalbuminemia, ascites, edema, abdominal pain, elevated INR, electrolyte abnormalities) and subacute lymphopenia and thrombocytopenia (likely due to a combination of myelosuppressive meds and chronic illness, may improve after he finishes Unasyn course). Currently his ascites/edema is stable; we will continue to monitor closely and consider albumin/lasix as needed. He also has steroid-induced hyperglycemia, currently being managed on insulin with daily adjustments.     #Hx Liver Transplant - Subacute/Acute Liver Failure: ??Biopsy confirmed severe acute rejection (9/25), RAI 9/9. Liver labs without a significant improvement after immunosuppression. S/p 12 days of ATG and high dose MethylPrednisolone (10/1-10/12) with poor overall response to therapy. Labs 10/26 with D-dimer elevated to 1067, ammonia <9 (46), GGT 442 (465), Tbili 32.5 (33.1).  Liver biopsy completed on 10/21 which showed ductopenia with duct loss involving more than 50% of portal tracts (8 of 12 portal areas, 66.7%), degenerative changes in remaining bile ducts and irregular areas of periportal and centrilobular hepatocyte dropout and fibrosis with fibrous bridging, consistent with chronic rejection.   - F/u 10/21 liver bx C3D stain sent to [facility]  -Tacro and Mycophenolate restarted 10/20    -Tac 4 mg BID   -F/u today's 10/27 tac trough, appreciate pharmacy assistance  - Continue prednisone 40mg  today  - Continue Valgancyclovir 450mg  daily (3months), Bactrim MWF (79mo) for prophylaxis (10/2-) and Nystatin 10ml TID (64mo) prophylaxis (10/2-)  - Discontinue  Vitamin K (10/15-10/25) given no significant improvement in PT/INR over the last week   - F/u Des-gamma-carboxy-prothrombin- if pt low in vit K, should result as elevated   - F/u factor V and VII  - Protonix 40 mg PO daily  - Continue Rifaximin   - PT/INR, CBC, CMP on M/Th   - AFP not completed bc sample icteric  - 10/21 liver transplant committee determined patient should proceed with complete liver transplant evaluation  - Today liver transplant team meeting with patient and grandma     #CLABSI: Strep mitis and MSSA   Developed sudden leukocytosis with white count elevated to 17.3 10/12, afebrile, non-toxic appearing, without localizing symptoms. Found to have positive Bcx 10/12 growing strep mitis and MSSA. Given ascites and risk for SBP secondary to strep mitis, also covering for anaerobes. Echo negative for endocarditis. Given patient had low WBC to 2.9 on 10/22, BCx repeated.  - 10/22 BCx NGTD  - CBC M/Th  - 10/12 peripheral/PICC Cx positive for Strep mitis   - 10/12 peripheral - +MSAA; 10/13 - NGTD  - 10/22-  NGTD  - Per ID recs:   - Unasyn x 14 days total (10/13-10/27), course completed today   - Plan for PICC removal prior to discharge    #Hypoalbuminemia- edema, ascites, secondary to liver dysfunction. Improved ascites s/p 25 g albumin and 40 mg lasix 10/26  - Following daily weights  - Strict I/Os      #Nutriton: severe protein-calorie malnutrition. Prior to admission, patient has been skipping meals due to poor appetite and food insecurity. Patient continues to have good appetite.  - Continue goal PO fluid intake of 1.6L   - Regular diet +  Ensure supplements PRN  - Vit D3 5,000 units daily  - Aquadex multivitamin    - Ca 300 mg BID    #Hypomagnesemia, hypophosphatemia:   - qMTh CMP, Mg, Phos  - Hypomagnesemia: Mag sulphate IV prn for mg<2, Daily PO mag oxide 280 mg BID  - Hypophosphatemia: Neutra phos TID      #Constipation, resolved:   - Miralax 17 g BID PRN     #Steroid induced hyperglycemia:??A1c??4.9% but with postprandial hyperglycemia. Still on PO prednisone 40mg  daily.   -Lantus 10 units at bedtime (give 6 units if NPO)  -Restart nutritional insulin when eating: Lispro 4 units with meals (Hold if NPO)  -Continue PRN Lispro dose for overnight meal: 4 units for normal-large meal, 2 units for smaller meal  -Continue Lispro correction 1:50>150 ACHS  -Endocrine following, appreciate    #Thrombocytopenia & lymphopenia: last plt transfusion 10/21 prior to liver biopsy  - Per ID, may improve after finishing unasyn course (which can cause myelosuppression). Bactrim can also cause myelosuppression, but ID would not recommend alternative agent for PJP ppx other than bactrim.  - CBCd M/Th  - Consider CBCd sooner if bleeding    #Impetigo Skin Lesions on Right Ear and Left posterior Neck, + MSSA, healing well   - Mupirocin ointment for both lesions until healed, per derm    #Steroid-induced acne, possible fungal folliculitis   - topical clindamycin, benzoyl peroxide, Ketoconazole shampoo    ??  #Major Depressive Disorder, insomnia: Stable. Has hx of attempted overdoses, with the last attempt in April 2019??at which point he was hospitalized in the Adolescent Psychiatry unit and discharged on Lexapro 20 mg. ??He has not engaged with mental health resources or taken medication for depression or insomnia in 8-10 months.????Denies SI, passive suicidality, self-harm, or HI  at this time.    - Patient saw adult psychiatry team again on 10/22. Offered SSRI, but patient not interested at this time  - Yesterday 10/28, patient agreeable to outpatient psychiatry appt when spoke with palliative care  - Melatonin 3 mg q1800 for sleep and circadian rhythm regulation    #Deconditioning. Patient endorses feeling weak and having limited mobility.   -PT following  -Encourage OOB and ambulation    #Homelessness - Social Concerns:??Shloimy has experienced homelessness for ~5 months when he left his mother's house. ??He has severed all ties with his family and states his relationship with his mother cannot be reconciled. ??He also requested contact information of family members to be erased from his chart (this has been completed, but social worker did drop contact info into her note on 9/24) and he more recently has reconciled with some family members. ??In addition to food insecurity, he has not been able to secure medications. ??He is routinely exposed to alcohol and drugs (marijuana, cocaine, etc.) second-hand through his friendships though he denies current use himself. ??On urine drug screen, he tested positive for marijuana only.  -Supportive care following.  -Designated a personal friend [name]) and grandfather to be his HCPOAs (Friend is the Primary).   -Spoke with sister on 10/16 (has not been in regular contact)   -Paternal grandmother now visiting regularly   -Will start discussing potential discharge plan   - As of now, plan is for patient to discharge home with grandma  ??  Access: PICC (placed 9/28), will need removal prior to discharge  ??  Discharge criteria:??safe discharge planning    Subjective:  No acute events overnight. Patient's grandma is at the bedside today for meeting with liver transplant team.     Objective:     Vital signs in last 24 hours:  Temp:  [36.8 ??C-37.3 ??C] 37.1 ??C  Heart Rate:  [78-93] 93  Resp:  [18-20] 20  BP: (97-107)/(55-63) 107/56  MAP (mmHg):  [69-74] 72  SpO2:  [100 %] 100 %  Intake/Output last 3 shifts:  I/O last 3 completed shifts:  In: 3121.2 [P.O.:2280; I.V.:841.2]  Out: 2320 [Urine:2320]    Physical Exam:   General:  Lying in bed. Thin appearing. Interactive and conversational   Head: normocephalic, atraumatic.  Eyes: EOMI. Marked scleral icterus present.   Nose:   clear, no discharge  Lungs: clear breath sounds bilaterally. No increased work of breathing. No wheezes/crackles. Good aeration at the bases.   Heart: regular rate and rhythm. No murmurs appreciated on auscultation.  Abdomen:  Abdomen less protuberant. Ascites present but improved s/p albumin/lasix 10/26. No HSM.   Neuro: deconditioned, moves all extremities with decreased strength. Extremities: non-pitting edema in the LE R>L, WWP  Skin: Skin lesions on left neck, healing well. Skin lesion present on right ear with scab, covered in gauze that is c/d/i. Acneform rash present on face, back, chest.     Active Medications reviewed and KEY Medications include:     Current Facility-Administered Medications:   ???  ampicillin-sulbactam dilution (UNASYN) injection 3 g, 3 g, Intravenous, Q6H, Darden Dates Gutierrez-Wu, MD, 3 g at 09/06/19 0930  ???  benzoyl peroxide 5 % gel, , Topical, Nightly, Alfredia Ferguson, MD  ???  calcium carbonate (OS-CAL) tablet 300 mg elem calcium, 300 mg elem calcium, Oral, BID, David Stall, MD, 300 mg elem calcium at 09/06/19 0928  ???  cholecalciferol (vitamin D3) tablet 5,000 Units, 5,000 Units, Oral, Daily, David Stall, MD,  5,000 Units at 09/06/19 1610  ???  clindamycin (CLEOCIN T) 1 % lotion 1 application, 1 application, Topical, daily, Alfredia Ferguson, MD, 1 application at 09/03/19 1628  ???  dextrose 50 % in water (D50W) 50 % solution 12.5 g, 12.5 g, Intravenous, Q10 Min PRN, David Stall, MD  ???  heparin preservative-free injection 10 units/mL syringe (HEPARIN LOCK FLUSH), 20 Units, Intravenous, Daily PRN, David Stall, MD, 20 Units at 08/27/19 1524  ???  influenza vaccine quad (FLUARIX, FLULAVAL, FLUZONE) (6 MOS & UP) 2020-21, 0.5 mL, Intramuscular, During hospitalization, David Stall, MD  ???  insulin glargine (LANTUS) injection 10 Units, 10 Units, Subcutaneous, Nightly, Lovie Chol, MD, 10 Units at 09/05/19 2132  ???  insulin lispro (HumaLOG) injection 0-10 Units, 0-10 Units, Subcutaneous, 5XD insulin, Lovie Chol, MD, 4 Units at 09/05/19 1743  ???  insulin lispro (HumaLOG) injection 6 Units, 6 Units, Subcutaneous, 5XD insulin, Chinelo C Okigbo, MD, 6 Units at 09/06/19 0940  ???  ketoconazole (NIZORAL) 2 % shampoo, , Topical, daily, Alfredia Ferguson, MD  ???  magnesium oxide (MAG-OX) capsule 280 mg, 280 mg, Oral, BID, Alfredia Ferguson, MD, 280 mg at 09/06/19 0926  ???  melatonin tablet 3 mg, 3 mg, Oral, QPM, Jonna Clark, MD, 3 mg at 09/05/19 2109  ???  multivitamin, with zinc (AQUADEKS) chewable tablet, 2 tablet, Oral, Daily, David Stall, MD, 2 tablet at 09/06/19 9604  ???  mupirocin (BACTROBAN) 2 % ointment, , Topical, TID, David Stall, MD  ???  mycophenolate (MYFORTIC) EC tablet 540 mg, 540 mg, Oral, BID, David Stall, MD, 540 mg at 09/06/19 0818  ???  nystatin (MYCOSTATIN) oral suspension, 1,000,000 Units, Oral, TID, David Stall, MD, 1,000,000 Units at 09/06/19 0929  ???  ondansetron [facility]) injection 4 mg, 4 mg, Intravenous, Once PRN, Purvis Kilts, MD  ???  ondansetron (ZOFRAN-ODT) disintegrating tablet 4 mg, 4 mg, Oral, Q8H PRN, David Stall, MD  ???  pantoprazole (PROTONIX) EC tablet 40 mg, 40 mg, Oral, Daily, David Stall, MD, 40 mg at 09/05/19 2114  ???  polyethylene glycol (MIRALAX) packet 17 g, 17 g, Oral, BID PRN, David Stall, MD  ???  potassium & sodium phosphates 250mg  (PHOS-NAK/NEUTRA PHOS) packet 2 packet, 2 packet, Oral, TID, Lewie Loron, MD, 2 packet at 09/06/19 [phone]  ???  predniSONE (DELTASONE) tablet 40 mg, 40 mg, Oral, Daily, David Stall, MD, 40 mg at 09/06/19 8119  ???  promethazine (PHENERGAN) 1 mg/mL pediatric dilution injection 6.25 mg, 6.25 mg, Intravenous, Q15 Min PRN, Purvis Kilts, MD  ???  rifAXIMin [employer]) tablet 550 mg, 550 mg, Oral, BID, David Stall, MD, 550 mg at 09/06/19 1020  ???  sodium chloride (NS) 0.9 % infusion, , Intravenous, Continuous, David Stall, MD, Last Rate: 10 mL/hr at 08/26/19 0537, 1,000 mL at 08/27/19 0731  ???  sodium chloride (NS) 0.9 % infusion, 10 mL/hr, Intravenous, Continuous, Ejiofor A Lucky Cowboy., MD, Last Rate: 10 mL/hr at 09/04/19 1013, 10 mL/hr at 09/04/19 1013  ???  sulfamethoxazole-trimethoprim (BACTRIM) 400-80 mg tablet 80 mg of trimethoprim, 1 tablet, Oral, Q MWF, David Stall, MD, 80 mg of trimethoprim at 09/05/19 0849  ???  tacrolimus (PROGRAF) capsule 4 mg, 4 mg, Oral, BID, David Stall, MD, 4 mg at 09/06/19 0818  ???  valGANciclovir (VALCYTE) tablet 450 mg, 450 mg, Oral, Daily, David Stall, MD, 450 mg at 09/05/19 1727  Studies: Personally reviewed and interpreted.    Labs/Studies:  Lab Results   Component Value Date    WBC 1.4 (L) 09/05/2019    HGB 8.9 (L) 09/05/2019    HCT 26.3 (L) 09/05/2019    PLT 16 (L) 09/05/2019       Lab Results   Component Value Date    NA 136 09/05/2019    K 4.3 09/05/2019    CL 105 09/05/2019    CO2  09/05/2019      Comment:      Specimen icteric.    BUN 16 09/05/2019    CREATININE 0.86 09/05/2019    GLU 217 (H) 09/05/2019    CALCIUM 8.2 (L) 09/05/2019    MG 1.4 (L) 09/05/2019    PHOS 4.0 09/05/2019       Lab Results   Component Value Date    BILITOT 32.5 (H) 09/05/2019    BILIDIR 30.50 (H) 09/04/2019    PROT  09/05/2019      Comment:      Specimen icteric.        ALBUMIN 2.5 (L) 09/05/2019    ALT  09/05/2019      Comment:      Specimen icteric.    AST 114 (H) 09/05/2019    ALKPHOS 781 (H) 09/05/2019    GGT 389 (H) 09/06/2019       Lab Results   Component Value Date    PT 17.3 (H) 09/05/2019    INR 1.49 09/05/2019    APTT 25.0 (L) 08/14/2019         Jonna Clark, MD  Anesthesiology - PGY-1  Pager: [phone]

## 2019-09-06 NOTE — Unmapped (Signed)
Problem: Adult Inpatient Plan of Care  Goal: Plan of Care Review  Outcome: Progressing  No acute events noted. Afebrile. VSS. RUE PICC w/o s/s of complications. IV Ampicillin continues as scheduled. NS at 20mls/hr infusing. Evening POCT bld glucose resulted at 100mg /dL. Scheduled Lantus and Humalog given, no SS required. Dressing removed from RUQ Liver biopsy site last evening, site C/D/I, site left OTA. AM lab collection for Tacrolimus trough level and GGT will be collected at 0700. Discussed overnight POC w/ Orson, verbal agreement voiced. Will continue to monitor.

## 2019-09-06 NOTE — Unmapped (Signed)
Pediatric Tacrolimus Therapeutic Monitoring Pharmacy Note  ??  Roark??[name]??is a 18 y.o.??male??restarting??tacrolimus.   ??  Indication:??Liver transplant??  ??  Date of Transplant: 09/22/2016??  ??  Current Dosing Information: Tacrolimus??4 mg PO BID  ??  Dosing Weight: 61.8 kg  ??  Goals:  Therapeutic Drug Levels  Tacrolimus trough goal: 8-10 ng/mL per primary team resident??and fellow  ??  Additional Clinical Monitoring/Outcomes  ?? Monitor renal function (SCr and urine output) and liver function (LFTs)  ?? Monitor for signs/symptoms of adverse events (e.g., hyperglycemia, hyperkalemia, hypomagnesemia, hypertension, headache, tremor)  ??  Results:??Tacrolimus Trough = 7.7 ng/mL however drawn at 10 hours post dose which would make the level even lower at 12 hours post dose.  ??  Longitudinal Dose Monitoring:  Date Dose (mg)  AM / PM AM Scr (mg/dL) Level  (ng/mL) Key Drug Interactions   09/06/19 4 mg / --- 7.7 None   09/05/19 4 mg / 4 mg 0.86 --- None   09/02/19 3.5 mg / 4 mg 0.78 7.5 None   09/01/19 3.5 mg / 3.5 mg 0.91 - None   08/31/19 3 mg / 3.5 mg 0.78 6.2 None   08/30/19 3 mg / 3 mg 0.66 - None   08/29/19 -- / 3 mg 0.63 - None   08/22/19 1.5 mg/ 3 mg 0.79 3.9 None   08/20/19 0.75 mg / 1.5 mg 0.98 2.7 None   08/19/19 0.75 mg / 0.75 mg 0.95 3.4 None   08/18/19 0.5 mg / 0.75 mg 1.13 2.6 None   08/17/19 0.5 mg / 0.5 mg ??1.12 3.4 None   08/16/19 0.25 mg / 0.5 mg 1.09 3.2 None   08/15/19 0.25 mg/0.5 mg 1.04 4.2 None   08/14/19 0.25 mg/0.5 mg 1.06 4.4 None   08/13/19 0.25 mg/0.25 mg 1.28 5.5 None   08/12/19 --- / 0.25 mg 1.25 5.9 None   08/11/19 0.5 mg /??--- ??1.26 ??10.1 ??None   08/10/19 1 mg / HOLD 1.4 10.5 None   08/09/19 1 mg / 1 mg 1.29 --- None   08/08/19 3 mg / HOLD 1.5 11.9 None   08/06/19 5 mg / 3 mg 0.8 9 None   ??  Pharmacokinetic Considerations and Significant Drug Interactions:  ?? Concurrent hepatotoxic medications: None identified  ?? Concurrent CYP3A4 substrates/inhibitors: None identified  ?? Concurrent nephrotoxic medications: Valganciclovir, SMP/TMX  ??  Assessment/Plan:   ?? Trough level subtherapeutic, Serum creatinine normalized after adequate PO fluid intake encouraged, U/O stable  ??  Recommendedation(s)  ?? Increase tacrolimus dosing to 5 mg PO BID  ?? Obtain next tacrolimus level 09/09/19 @ 0800??  ??  Follow-up  ?? Next level ordered: 09/09/19 at 0800.  ?? A pharmacist will continue to monitor and recommend levels as appropriate  ??  The above plan was discussed and approved by medical team.  ??  Please page service pharmacist with questions/clarifications.  ??  Fransico Meadow, PharmD  Pediatric Clinical Pharmacist  [phone] (satellite)

## 2019-09-06 NOTE — Unmapped (Signed)
Follow-up Pediatric Palliative Care Consult Note:  ??  [name] Gross is a 18 y.o. male seen for pediatric palliative care consultation at the request of Dr. Curtis Gross for goals of care, advance care planning, and family support.  ??  Primary Care Provider: [name], MD  ??  History provided by: Patient  ??  Assessment:  ??  [name] Gross is a 18 y.o. 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  After receiving treatment, his most recent liver biopsy on 10/21 showed results consistent with chronic rejection.  [name] Gross was encouraged to take measures to improve his candidacy for liver transplant, including addressing social barriers and receiving mental health counseling as he will likely need a transplant in the future.  He had a meeting with the liver transplant team today (10/27) to discuss his eligibility.  Discharge is anticipated early next week as [name] Gross's condition has now stabilized.  During this admission, [name] Gross identified surrogate decision makers (he does not wish for this to default to next of kin) because he was at risk for further decompensation/further decline in health.  Additionally, [name] Gross is actively completing Vo[facility] My Choices ACP guide to help communicate his wishes to family, designated decision makers, and his medical team.   Palliative care consult requested for goals of care, advance care planning, and family support.     ??  Summary of Discussion and Recommendations:   ??  1. SYMPTOMS:   - S/p liver biopsy on 10/21.  Results consistent with chronic rejection.  [name] Gross reported that his rejection score has decreased from 9/9 to 2/9.   - He denied pain, nausea, poor appetite, insomnia, or decreased mood.  - [name] Gross revealed that he had previously been diagnosed with bipolar disorder.  He says that he is now open to outpatient therapy and medication to support his mental health.  ??  2. GOALS:   - [name] Gross hopes to make a recovery, secure a place to live, and find a job.   - [name] Gross has long term goals which include going back to school and becoming a Runner, broadcasting/film/video or nurse.   ??  3. DECISIONS:   - Given risk of hepatic encephalopathy, and general risk of his condition deteriorating, recommended [name] Gross think about who he would want to be his surrogate decision maker if he was unable to make decisions for himself (see ACP below)   - [name] Gross likes to receive information directly.   - [name] Gross has reviewed with [name] Gross that redo liver transplant can be an option for some people. [name] Gross acknowledged knowing that he may not be a candidate and that he doesn't know if he would choose to pursue another transplant. He is awaiting more conversations about this.   - [name] Gross Decision Making Tool given and explained at a previous visit. He was encouraged to use this tool along with an ACP guide to document information he receives from providers.   ??  4. ADVANCE CARE PLANNING  - [name] Gross has designated his friend [name] Gross and grandfather as [name] Gross.  - Code status: Full code. Prognosis: Guarded given severity of liver disease and potential for lack of response to salvage therapies, AKI, and complex social situation with lack of caregiver support.  ??  5. SUPPORT:   - [name] Gross describes limited personal supports including an aunt and maternal grandparents, and friends. He has not spoken to any  family since being admitted to the hospital. He has reached out to two friends who have been good supports throughout his illness  - [name] Gross agreed to begin SSI application as suggested by transplant social worker, as the process could take months to complete.  He has since decided to wait until he was on a secure network instead of the an open/public network at the hospital due to security concerns.  He was also told how to access his medical records.  ??  6. CARE COORDINATION:   - Care coordinated with GI team, nursing, and PT.         Total time spent with patient for evaluation & management (excluding ACP documented separately): 20 minutes.  Greater than 50% of this time spent on counseling/coordination of care: Yes.  See ACP Note from today for additional billable service: Yes.     We appreciate the consult. The [facility] Supportive Care Team can be reached by pager [name] Gross) or email ([email or web address]).   ??  ??  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 His hospital course has been complicated by AKI, significant ascites. His most recent liver biopsy on 10/21 showed chronic rejection. He is currently stable enough for discharge but will likely need transplant in the future.    ??  Interval events:  [name] Gross was walking in the hallway with PT when we arrived to visit with him.  Once he had returned to his room, he stated he was in good spirits and was anticipating discharge early next week.  He was looking forward to completing his IV antibiotics today, so he could be disconnected from fluids.  His paternal grandmother [name] Gross) arrived this morning and was still at the hospital, but had stepped outside.    [name] Gross met with the liver transplant team this morning along with his grandmother.  The team discussed the tasks he would need to accomplish in order to be listed for transplant which included addressing social barriers and mental health issues (e.g. outpatient therapy, medications).  Today, [name] Gross confirmed that he would want to pursue liver transplant, whereas, when we first met him, he was unsure.  He stated that his paternal grandmother, who he will discharge home with, would be his primary caregiver.  He is hoping that his maternal grandfather would agree to be his second caregiver but had not discussed this with him.    We agreed to follow-up with [name] Gross later this week as he prepares for discharge.    Allergies:  Bee pollen and Pollen extracts  ??  Medications:  Scheduled Meds:  ??? ampicillin-sulbactam  3 g Intravenous Q6H   ??? benzoyl peroxide   Topical Nightly   ??? calcium carbonate  300 mg elem calcium Oral BID   ??? cholecalciferol (vitamin D3)  5,000 Units Oral Daily   ??? clindamycin  1 application Topical daily   ??? flu vacc [id number] 6mos up(PF)  0.5 mL Intramuscular During hospitalization   ??? insulin glargine  10 Units Subcutaneous Nightly   ??? insulin lispro  0-10 Units Subcutaneous 5XD insulin   ??? insulin lispro  6 Units Subcutaneous 5XD insulin   ??? ketoconazole   Topical daily   ??? magnesium oxide  280 mg Oral BID   ??? melatonin  3 mg  Oral QPM   ??? multivitamin, with zinc  2 tablet Oral Daily   ??? mupirocin   Topical TID   ??? mycophenolate  540 mg Oral BID   ??? nystatin  1,000,000 Units Oral TID   ??? pantoprazole  40 mg Oral Daily   ??? potassium & sodium phosphates 250mg   2 packet Oral TID   ??? predniSONE  40 mg Oral Daily   ??? rifAXIMin  550 mg Oral BID   ??? sulfamethoxazole-trimethoprim  1 tablet Oral Q MWF   ??? tacrolimus  5 mg Oral BID   ??? valGANciclovir  450 mg Oral Daily     Continuous Infusions:  ??? sodium chloride 500 mL (08/26/19 0537)   ??? sodium chloride 10 mL/hr (09/04/19 1013)     PRN Meds:.dextrose 50 % in water (D50W), heparin, porcine (PF), ondansetron, ondansetron, polyethylen glycol, promethazine    Physical Exam:   Vitals:    09/06/19 0753   BP: 107/56   Pulse: 93   Resp: 20   Temp: 37.1 ??C (98.7 ??F)   SpO2: 100%   ??  Gen: thin young man walking in hallway, later sitting on side of bed, NAD.  Skin: jaundiced.  HEENT: +scleral icterus.   Chest: normal work of breathing.  Abd: protruberant.  Psych: in good spirits, smiling and laughing at times.   ??  Data: Reviewed test results in Epic.    La Lindwood Qua, [place], CPNP  [facility] Team  [phone] (pager)      Collaborating Physician Attestation    I was the supervising physician in the delivery of the service. I discussed [name] Gross's case with [facility], CPNP and was involved with making the recommendations contained in this note.     [name] Reining, MD, MPH  Attending Physician, [facility] Supportive Care Team

## 2019-09-06 NOTE — Unmapped (Signed)
CONFIDENTIAL PSYCHOLOGICAL NOTE/FOLLOW-UP    Patient Name: [name] Gross  Medical Record Number: [id number]  Date of Service: September 13, 2019  Clinical Psychologist: Deedra Ehrich, PsyD  Evaluation Duration and Procedures: 90 minute clinical interview; record review; case consultation; Transplant SW, Carole Binning was present for evaluation    This evaluation note may contain sensitive and confidential information regarding the patient???s psychosocial adjustment to living with a chronic medical condition. DO NOT share this information outside [facility] without written consent from the patient explicitly stating that mental health records may be released.     The limits of confidentiality and the purpose of the evaluation were reviewed. The patient was provided with a verbal description of the nature and purpose of the psychological evaluation. I also reviewed the referral source, specific referral question for this evaluation, foreseeable risks/discomforts, benefits, limits of confidentiality, and mandatory reporting requirements of this provider. The patient was given the opportunity to ask questions and receive answers about the present evaluation. Oral consent was provided by the patient.     BACKGROUND INFORMATION/RECORD REVIEW:  Mr.??[name]??Gross??is an 18 y.o.????male??with major depressive disorder with prior suicide attempts as well as idiopathic cirrhosis s/p liver transplant (2017) with prior episodes of rejection and treatment non compliance. Pt has been admitted since 08/04/2019 for concerns of rejection 2/2 poor adherence to medication regimen. Pt was referred for psychological assessment as part of a comprehensive assessment for liver re-transplantation.     He was previously followed by pediatric psychologist Dr. Samara Deist and most recently seen by adult transplant psychologist, Mora Bellman, in August, September, and October of 2019 while inpatient and after discharge for support for depressive symptoms.   ??  Per Dr. Everlene Other 01/14/18 note: This is [name] Gross's 3rd suicide attempt. Combination of factors including health concerns, romantic relationship, and grief contributed to current attempt. He has been receiving outpatient psychotherapy but stopped his antidepressant meds in December. ??Inpatient psychiatric hospitalization will be necessary once [name] Gross is medically stable. He currently may be avoiding his emotional reaction to the news about rejection of transplant, preferring to discuss other challenges in his life. Today, [name] Gross and his mother expressed frustration over delay in biopsy procedure but also shared understanding that schedules can change in the hospital. [name] Gross continued to discuss his approaches to relationships and challenges with trusting others. He denied concerns about his liver. Presents as a transfer from an OSH after intentionally ingesting tamiflu, prednisone, ibuprofen, and vitamin D. At the OSH??ED, [name] Gross complained of N/V and was given a 1L NS bolus and 4mg  of zofran. Labs were remarkable for AST and ALT elevations >??1000 (both were normal on last labs in Jan 2019 at [facility]), total bili of 6.4, and negative tylenol/salicylate/EtOH levels. The OSH called the poison control center who recommended observation for at least 4-6 hours and recheck labs. Due to concern that the above medications are not typically hepatotoxic and [name] Gross may be in rejection, he was transferred to [facility] for ongoing management.   ??  Per chart review, pt was psychiatrically hospitalized at [facility] in 02/2018. He has a long history of limited engagement and refusal to participate in [facility] care.    Dr. Delton See notes indicate pt consistently minimized his mental health symptoms as well as his suicide attempt. He refused to make a safety plan, indicating he would NOT contact family or friends should he experience suicidal ideation again. He denied the need for ongoing mental health treatment and discontinued services with Dr. Lindaann Slough and declined offers to connect  pt with a local provider.     Pt was referred to transplant psychologist, Dr. Artemio Aly, for therapy while inpatient. He was seen on 08/29/2019 and pt endorsed symptoms of depression and motivation to engage in treatment.     BEHAVIORAL OBSERVATIONS:  [name] was seen in his assigned hospital room with his paternal grandmother, [name] Gross. Rapport was easily established.  He did tend to minimize his mental health symptoms and inflate his motivation to adhere to treatment.     MENTAL STATUS EXAM:  Appearance: Appears stated age and Malnourished  Motor: tremors in hands and arms  Speech/Language: Normal rate, volume, tone, fluency  Mood: I'm great!  Affect: Calm, Cooperative, Depressed and tearful  Thought Process: Logical, linear, clear, coherent, goal directed  Thought Content: Denies SI, HI, self harm, delusions, obsessions, paranoid ideation, or ideas of reference  Perceptual Disturbances: Denies auditory and visual hallucinations, behavior not concerning for response to internal stimuli  Orientation: Oriented to person, place, time, and general circumstances  Attention: Able to fully attend without fluctuations in consciousness  Concentration: Able to fully concentrate and attend  Memory: Immediate, short-term, long-term, and recall grossly intact  Fund of Knowledge: Consistent with level of education and development  Insight: Fair  Judgment: Fair  Impulse Control: Intact    HEALTH/MEDICAL:  Interval Changes: Pt's health has declined in recent months leading to current hospitalization. Pt is experiencing acute rejection and liver failure. Pt also presented with severe malnutrition, dark stools, hyperglycemia, AKI, and active symptoms of depression. Pt has been homeless since leaving his mother's home about six months ago.   Current Symptoms: Pt reported feeling fatigued.   Pain (0=no pain; 10=worst pain imaginable): 0/10  Pain Medications: use them as prescribed  Medications:   Current Facility-Administered Medications   Medication Dose Route Frequency Provider Last Rate Last Dose   ??? albuterol (PROVENTIL HFA;VENTOLIN HFA) 90 mcg/actuation inhaler 2-4 puff  2-4 puff Inhalation Once Glorianne Manchester, MD         Current Outpatient Medications   Medication Sig Dispense Refill   ??? blood sugar diagnostic Strp Use to check blood sugar four times daily as directed. 100 strip 12   ??? blood-glucose meter kit Use as instructed 1 each 0   ??? calcium carbonate (OS-CAL) 1,500 mg (600 mg elem calcium) tablet Take 1/2 tablet (300 mg elem calcium) by mouth Two (2) times a day. (Over the counter med) 60 tablet 0   ??? cholecalciferol, vitamin D3, 125 mcg (5,000 unit) tablet Take 1 tablet (5,000 Units total) by mouth daily. 30 tablet 11   ??? clindamycin-benzoyl peroxide 1.2 %(1 % base) -5 % gel Apply to rash on chest 45 g 0   ??? glucagon 0.5 mg/0.1 mL Syrg Inject 0.1 mL under the skin once as needed for up to 1 dose. 0.2 mL 2   ??? glucose 4 GM chewable tablet Chew 1 tablet (4 g total) 4 (four) times a day as needed for low blood sugar (refer to hypoglycemia guideline). 50 tablet 12   ??? insulin glargine (LANTUS) 100 unit/mL injection Inject 0.1 mL (10 Units total) under the skin nightly. 10 mL 0   ??? insulin lispro (HUMALOG) 100 unit/mL injection Inject 0.06 mL (6 Units total) under the skin Five (5) times a day and Sliding scale 0 to 10 units under the skin five times a day as directed. 30 mL 0   ??? insulin syr/ndl U100 half mark (BD INSULIN SYRINGE HALF UNIT) 0.3 mL  31 gauge x 5/16 Syrg Use to inject insulins up to 6 times daily as directed. 200 each 12   ??? ketoconazole (NIZORAL) 2 % shampoo Apply topically daily as directed. 120 mL 0   ??? lancets Misc Use to check blood glucose 6 times per day. 204 each 0   ??? magnesium oxide (MAG-OX) 400 mg (241.3 mg magnesium) tablet Take 1 tablet (400 mg total) by mouth daily. 30 tablet 3   ??? melatonin 3 mg Tab Take 1 tablet (3 mg total) by mouth every evening. (Patient not taking: Reported on 09/13/2019) 60 tablet 0   ??? multivitamin, with zinc (AQUADEKS) 100-5 mcg-mg Chew Chew 2 tablets daily. 60 tablet 0   ??? mupirocin (BACTROBAN) 2 % ointment Apply topically Three (3) times a day for 7 days. 22 g 0   ??? mycophenolate (MYFORTIC) 180 MG EC tablet Take 3 tablets (540 mg total) by mouth Two (2) times a day. 180 tablet 11   ??? nystatin (MYCOSTATIN) 100,000 unit/mL suspension Take 10 mls (1,000,000 Units total) by mouth Three (3) times a day. 900 mL 0   ??? ondansetron (ZOFRAN-ODT) 4 MG disintegrating tablet Take 1 tablet (4 mg total) by mouth every eight (8) hours as needed for up to 7 days. (Patient not taking: Reported on 09/13/2019) 10 tablet 0   ??? pantoprazole (PROTONIX) 40 MG tablet Take 1 tablet (40 mg total) by mouth daily. 30 tablet 0   ??? polyethylene glycol (MIRALAX) 17 gram packet Take 17 g (1 packet dissolved in 4 to 8 oz. of liquid) by mouth two (2) times a day as needed (constipation). 100 each 0   ??? potassium & sodium phosphates 250mg  (PHOS-NAK/NEUTRA PHOS) 280-160-250 mg PwPk Take 2 packets by mouth Three (3) times a day. 180 packet 0   ??? predniSONE (DELTASONE) 20 MG tablet Take 2 tablets (40 mg total) by mouth daily. 60 tablet 0   ??? rifAXIMin (XIFAXAN) 550 mg Tab Take 1 tablet (550 mg total) by mouth Two (2) times a day. 60 tablet 0   ??? sulfamethoxazole-trimethoprim (BACTRIM) 400-80 mg per tablet Take 1 tablet (80 mg of trimethoprim total) by mouth Every Monday, Wednesday, and Friday. 12 tablet 0   ??? tacrolimus (PROGRAF) 1 MG capsule Take 4 capsules (4 mg) by mouth two (2) times a day. 240 capsule 11   ??? valGANciclovir (VALCYTE) 450 mg tablet Take 1 tablet (450 mg total) by mouth daily. 30 tablet 0   ??? venlafaxine (EFFEXOR-XR) 37.5 MG 24 hr capsule Take 1 capsule (37.5 mg total) by mouth daily. 30 capsule 0       ADHERENCE ISSUES:  Diet Adherence: Poor  Medication Management: Pt had poorly managed his medications over the last several months. He reported strong motivation to adhere moving forward.   Medication Concerns: denied problems taking medications, concerns about side effects, affordability, problems obtaining medications, and difficulty remembering medications  Attendance to appointments: Poor ; Pt has not followed up with transplant team for several months. He reported motivation to re-engage with team and adhere to follow-up.   Health Literacy Estimation:  Good     Dialysis: N/A  Diabetes: Pt's has been refusing blood sugar checks while inpatient. He claimed he did not understand why he was being asked to take it four times daily, but then admitted he understood.   Sleep Apnea: N/A    SUBSTANCE USE ISSUES:   Nicotine/Tobacco: Denied  Interval alcohol use: Denied  Substance abuse/relapse prevention treatment: Denied  Illicit drug  use: Pt's UDS was positive for cannabis, although pt adamantly denied using.   -If yes type: marijuana  Licit substance abuse or misuse: Denied  Alcohol or drug-related legal problems: Denied  Relapse risk: low    TRANSPLANT ISSUES:  Interval changes since last evaluation: Pt has been very ambivalent about pursuing retransplantation. On admission, he denied wanting to undergo reassessment. He now says he wants to have a transplant  in order to continue to live and achieve goals in his life.     MENTAL HEALTH:   Mental health changes since last evaluation:  Pt initially denied any mental health symptoms. On further discussion, pt admitted he had been feeling depressed since leaving his mother's home. He has not engaged in mental health treatment in almost a year. He denied experiencing any SI in the last several months. Pt was very tearful when discussing the idea of retransplantation. He asked to speak with psychiatry again, as he is interested in restarting an antidepressant. He also reported motivation to continue to engage in therapy.     SOCIAL ISSUES:   Interval changes since last evaluation: Pt has been homeless for the last several months after leaving his mother's house following an argument. He has not spoke with his mother since then and reported he does not plan to speak her to anytime soon.  Pt was staying with friends in [place] where he was exposed to drugs and alcohol daily. He was withdrawn from most family members for several months. On admission to the hospital last month, pt declined the option to live with his grandmother despite the offer. He indicated he preferred to be homeless than to speak with his family. At this time, pt's paternal grandmother is present and pt is willing to live with her. His father has also been providing some support and plans to help support pt financially (along with grandmother). Pt has no income and is not currently on disability. He plans to apply. There appears to be adequate support available to pt, however, he has declined offers of support from some.     INTERVENTION:  Psychological Assessment    PSYCHIATRIC DIAGNOSIS:  MDD, moderate, recurrent    COLLATERAL INFORMATION:    Pt's grandmother was present for assessment. Pt used to live with his grandmother, so she is familiar with him. She was unaware of his recent circumstances, but pt was open with her about his experiences. Pt does not want his mother involved in his care at this time, so collateral was unable to be collected from her.     IMPRESSIONS AND RECOMMENDATIONS:      [name] was seen today for assessment of re-transplantation.  He is currently hospitalized for rejection due to poor adherence to medical regimen.  He has been homeless for several months due to discord between he and his mother, which has not been resolved. Pt's paternal grandmother was present and has offered to have pt live with her. Pt is open to this now, although he was not earlier in his admission. Pt has a history of Major Depressive Disorder with history of at least three prior suicide attempts, most recent one in March 2019. Pt has not engaged in consistent follow-up for his mental health treatment and tends to minimize his symptoms. He has declined recommendations for psychotropic intervention throughout his admission. During assessment, pt is asked to see psychiatry again to start antidepressant. He started therapy with transplant psychologist earlier this month and reports motivation to continue therapy. Pt  has a history of initially engaging in treatment then declining treatment, so this will need to continue to be monitored. Pt was initially against the idea of re-transplantation, but now reports strong motivation to pursue re-transplantation. Pt's social support appears adequate at this time. His willingness to receive help should continue to monitored, as he has declined support from his family recently.     Pt is assessed to be a marginal candidate for transplant from a psychological perspective due to untreated active mental health symptoms, history of poor adherence to transplant follow-up and medication regimen, poor follow-up with mental health treatment, and recent unstable housing and finances.     We have significant concerns about [name] candidacy on the transplant list. To optimize his [name] outcomes while listed and after transplant, the following is recommended:     1. Engage in at least three months of individual psychotherapy to address symptoms of depression.   2. Demonstrate at least three months of good adherence to prescribed psychotropic medications and appointment follow-up.  3. Demonstrate at least three months of good adherence to medical recommendations including all labs, medications, and appointments.  4. Engage in follow-up with psychology after above three recommendations are complete.   5. Continue to work with transplant Child psychotherapist on identifying back up care giving plan.   6. Follow recommendations of transplant social worker.    Final decision regarding listing status is based upon committee review at selection meeting.     Recommendations discussed with patient?  yes  Agreed upon by patient?  yes     [name] was given this writer's business card with confidential voice mail number and instructed to call [phone] for emergencies.

## 2019-09-06 NOTE — Unmapped (Signed)
Endocrinology Consult - Follow Up Note    Requesting Attending Physician :  Arnold Long Mir, MD  Service Requesting Consult : Ped Gastroenterology [facility])  Primary Care Provider: [name], MD  Outpatient Endocrinologist: N/A    Assessment/Recommendations:  [name] Gross is a 18 y.o. male with steroid induced hyperglycemia who is admitted for Liver transplant rejection (CMS-HCC). Endocrinology have been consulted to evaluate patient for hyperglycemia.    Steroid induced hyperglycemia: A1c 4.9% but with postprandial hyperglycemia. Still on PO prednisone 40mg  daily.  Glycemic control remains above goal but improving. Recommend continuing the current regimen:  -Lantus 10 units at bedtime (give 6 units if NPO)  -Lispro 6 units with meals (Hold if NPO) - ordered as 5x a day to allow for late night meal coverage  -Lispro correction to 2:50>150 with meals and at bedtime    The patient was discussed with attending, Dr. Marcello Fennel    We will continue to follow patient as needed. Please call/page [phone] with questions or concerns.     Farrel Conners, MD  [facility] Endocrinology Fellow     I was immediately available via phone/pager or present on site.  I reviewed and discussed the case with the resident, but did not see the patient.  I agree with the assessment and plan as documented in the resident's note.     Maximino Greenland, MD, MPH  Attending - Endocrinology, Diabetes, and Metabolism    Time spent reviewing chart 10 minutes  Time spent with fellow 10 minutes      History of Present Illness:     Reason for Consult: hyperglycemia.     [name] Gross is a 18 y.o. male who is admitted for liver transplant rejection (CMS-HCC). Endocrinology have been consulted to evaluate patient for hyperglycemia.    Interval history: Continues to have prandial hyperglycemia mainly due to variable PO intake and occasionally refusing insulin regimen. 24 hr BG range was 100-227 and FBG today 78.    Current regimen: Lantus 10 units qhs, Lispro 6u with meals and snacks, lispro 2:50>150 with meals and snacks    Active Orders   Diet    Nutrition Therapy Regular/House     ROS:  As per HPI, otherwise 10 system ROS was negative.      All Medications:   Current Facility-Administered Medications   Medication Dose Route Frequency Provider Last Rate Last Dose   ??? ampicillin-sulbactam dilution (UNASYN) injection 3 g  3 g Intravenous Q6H David Stall, MD   3 g at 09/06/19 0930   ??? benzoyl peroxide 5 % gel   Topical Nightly Alfredia Ferguson, MD       ??? calcium carbonate (OS-CAL) tablet 300 mg elem calcium  300 mg elem calcium Oral BID David Stall, MD   300 mg elem calcium at 09/06/19 0928   ??? cholecalciferol (vitamin D3) tablet 5,000 Units  5,000 Units Oral Daily David Stall, MD   5,000 Units at 09/06/19 4782   ??? clindamycin (CLEOCIN T) 1 % lotion 1 application  1 application Topical daily Alfredia Ferguson, MD   1 application at 09/03/19 1628   ??? dextrose 50 % in water (D50W) 50 % solution 12.5 g  12.5 g Intravenous Q10 Min PRN David Stall, MD       ??? heparin preservative-free injection 10 units/mL syringe (HEPARIN LOCK FLUSH)  20 Units Intravenous Daily PRN David Stall, MD   20 Units at 08/27/19 1524   ???  influenza vaccine quad (FLUARIX, FLULAVAL, FLUZONE) (6 MOS & UP) 2020-21  0.5 mL Intramuscular During hospitalization David Stall, MD       ??? insulin glargine (LANTUS) injection 10 Units  10 Units Subcutaneous Nightly Lovie Chol, MD   10 Units at 09/05/19 2132   ??? insulin lispro (HumaLOG) injection 0-10 Units  0-10 Units Subcutaneous 5XD insulin Lovie Chol, MD   4 Units at 09/05/19 1743   ??? insulin lispro (HumaLOG) injection 6 Units  6 Units Subcutaneous 5XD insulin Lovie Chol, MD   6 Units at 09/06/19 0940   ??? ketoconazole (NIZORAL) 2 % shampoo   Topical daily Alfredia Ferguson, MD       ??? magnesium oxide (MAG-OX) capsule 280 mg  280 mg Oral BID Alfredia Ferguson, MD 280 mg at 09/06/19 0926   ??? melatonin tablet 3 mg  3 mg Oral QPM Jonna Clark, MD   3 mg at 09/05/19 2109   ??? multivitamin, with zinc (AQUADEKS) chewable tablet  2 tablet Oral Daily David Stall, MD   2 tablet at 09/06/19 6606   ??? mupirocin (BACTROBAN) 2 % ointment   Topical TID David Stall, MD       ??? mycophenolate (MYFORTIC) EC tablet 540 mg  540 mg Oral BID David Stall, MD   540 mg at 09/06/19 0818   ??? nystatin (MYCOSTATIN) oral suspension  1,000,000 Units Oral TID David Stall, MD   1,000,000 Units at 09/06/19 0929   ??? ondansetron [facility]) injection 4 mg  4 mg Intravenous Once PRN Purvis Kilts, MD       ??? ondansetron (ZOFRAN-ODT) disintegrating tablet 4 mg  4 mg Oral Q8H PRN David Stall, MD       ??? pantoprazole (PROTONIX) EC tablet 40 mg  40 mg Oral Daily David Stall, MD   40 mg at 09/05/19 2114   ??? polyethylene glycol (MIRALAX) packet 17 g  17 g Oral BID PRN David Stall, MD       ??? potassium & sodium phosphates 250mg  (PHOS-NAK/NEUTRA PHOS) packet 2 packet  2 packet Oral TID Lewie Loron, MD   2 packet at 09/06/19 [phone]   ??? predniSONE (DELTASONE) tablet 40 mg  40 mg Oral Daily David Stall, MD   40 mg at 09/06/19 0109   ??? promethazine (PHENERGAN) 1 mg/mL pediatric dilution injection 6.25 mg  6.25 mg Intravenous Q15 Min PRN Purvis Kilts, MD       ??? rifAXIMin [facility]) tablet 550 mg  550 mg Oral BID David Stall, MD   550 mg at 09/06/19 1020   ??? sodium chloride (NS) 0.9 % infusion   Intravenous Continuous David Stall, MD 10 mL/hr at 08/26/19 0537 1,000 mL at 08/27/19 0731   ??? sodium chloride (NS) 0.9 % infusion  10 mL/hr Intravenous Continuous Ejiofor A Lucky Cowboy., MD 10 mL/hr at 09/04/19 1013 10 mL/hr at 09/04/19 1013   ??? sulfamethoxazole-trimethoprim (BACTRIM) 400-80 mg tablet 80 mg of trimethoprim  1 tablet Oral Q MWF David Stall, MD   80 mg of trimethoprim at 09/05/19 0849   ??? tacrolimus (PROGRAF) capsule 4 mg  4 mg Oral BID David Stall, MD   4 mg at 09/06/19 0818   ??? valGANciclovir (VALCYTE) tablet 450 mg  450 mg Oral Daily David Stall, MD   450 mg at 09/05/19 1727  Objective: :  BP 107/56  - Pulse 93  - Temp 37.1 ??C (Oral)  - Resp 20  - Ht 174.6 cm (5' 8.74)  - Wt 57.1 kg (125 lb 12.8 oz)  - SpO2 100%  - BMI 18.72 kg/m??     Physical Exam:  GEN: alert, oriented, not in acute distress  Resp: no increased WOB  ABD: no distension  PSYCH: flat affect, disengaged  Skin: scattered papules noted    Test Results    Lab Results   Component Value Date    POCGLU 78 09/06/2019    POCGLU 100 09/05/2019    POCGLU 227 (H) 09/05/2019    POCGLU 184 (H) 09/05/2019    POCGLU 196 (H) 09/04/2019    POCGLU 128 09/04/2019    POCGLU 157 09/03/2019    POCGLU 159 09/03/2019    POCGLU 222 (H) 09/02/2019    POCGLU 311 (H) 09/02/2019     Lab Results   Component Value Date    A1C 5.0 08/17/2019    A1C 4.9 10/13/2018    A1C 7.6 (H) 07/16/2018     Lab Results   Component Value Date    CREATININE 0.86 09/05/2019    NA 136 09/05/2019    K 4.3 09/05/2019    CL 105 09/05/2019    CO2  09/05/2019      Comment:      Specimen icteric.    ANIONGAP  09/05/2019      Comment:      Unable to calculate    CALCIUM 8.2 (L) 09/05/2019    ALBUMIN 2.5 (L) 09/05/2019     Lab Results   Component Value Date    WBC 1.4 (L) 09/05/2019    HGB 8.9 (L) 09/05/2019    PLT 16 (L) 09/05/2019     Lab Results   Component Value Date    AST 114 (H) 09/05/2019    ALT  09/05/2019      Comment:      Specimen icteric.    ALKPHOS 781 (H) 09/05/2019     Lab Results   Component Value Date    TSH 0.797 02/11/2018    FREET4 1.49 02/11/2018     Lab Results   Component Value Date    CHOL 127 08/12/2017    TRIG 49 08/12/2017    HDL 42 08/12/2017    LDL 75 08/12/2017

## 2019-09-07 LAB — PTH-RELATED PEPTIDE: Parathyrin related protein:SCnc:Pt:Ser/Plas:Qn:: <0.4

## 2019-09-07 MED ORDER — MAGNESIUM 84.5 MG (AS MAGNESIUM OXIDE 140 MG) CAPSULE
Freq: Two times a day (BID) | ORAL | 0 refills | 0 days
Start: 2019-09-07 — End: ?

## 2019-09-07 MED ORDER — MUPIROCIN 2 % TOPICAL OINTMENT
Freq: Three times a day (TID) | TOPICAL | 0 refills | 7 days | Status: CP
Start: 2019-09-07 — End: 2019-09-14
  Filled 2019-09-09: qty 22, 7d supply, fill #0

## 2019-09-07 MED ORDER — POLYETHYLENE GLYCOL 3350 17 GRAM ORAL POWDER PACKET
Freq: Two times a day (BID) | ORAL | 0 refills | 50 days | Status: CP | PRN
Start: 2019-09-07 — End: 2019-10-27
  Filled 2019-09-09: qty 60, 30d supply, fill #0

## 2019-09-07 MED ORDER — KETOCONAZOLE 2 % SHAMPOO
Freq: Every day | TOPICAL | 0 refills | 30 days | Status: CP
Start: 2019-09-07 — End: 2019-10-07
  Filled 2019-09-09: qty 120, 20d supply, fill #0

## 2019-09-07 MED ORDER — SULFAMETHOXAZOLE 400 MG-TRIMETHOPRIM 80 MG TABLET
ORAL_TABLET | ORAL | 0 refills | 28.00000 days | Status: CP
Start: 2019-09-07 — End: 2019-10-07
  Filled 2019-09-09: qty 12, 28d supply, fill #0

## 2019-09-07 MED ORDER — INSULIN GLARGINE (U-100) 100 UNIT/ML SUBCUTANEOUS SOLUTION
Freq: Every evening | SUBCUTANEOUS | 0 refills | 100 days | Status: CP
Start: 2019-09-07 — End: 2019-10-07
  Filled 2019-09-09: qty 200, 32d supply, fill #0

## 2019-09-07 MED ORDER — CLINDAMYCIN 1 %-BENZOYL PEROXIDE 5 % TOPICAL GEL WITH PUMP
0 refills | 0 days | Status: CP
Start: 2019-09-07 — End: ?

## 2019-09-07 MED ORDER — ONDANSETRON 4 MG DISINTEGRATING TABLET
ORAL_TABLET | Freq: Three times a day (TID) | ORAL | 0 refills | 4.00000 days | Status: CP | PRN
Start: 2019-09-07 — End: 2019-09-14
  Filled 2019-09-09: qty 10, 4d supply, fill #0

## 2019-09-07 MED ORDER — NYSTATIN 100,000 UNIT/ML ORAL SUSPENSION
Freq: Three times a day (TID) | ORAL | 0 refills | 2.00000 days | Status: CP
Start: 2019-09-07 — End: ?

## 2019-09-07 MED ORDER — POTASSIUM, SODIUM PHOSPHATES 280 MG-160 MG-250 MG ORAL POWDER PACKET
PACK | Freq: Three times a day (TID) | ORAL | 0 refills | 30.00000 days | Status: CP
Start: 2019-09-07 — End: 2019-10-07
  Filled 2019-09-09: qty 180, 30d supply, fill #0

## 2019-09-07 MED ORDER — MYCOPHENOLATE SODIUM 180 MG TABLET,DELAYED RELEASE
ORAL_TABLET | Freq: Two times a day (BID) | ORAL | 11 refills | 30.00000 days | Status: CP
Start: 2019-09-07 — End: 2020-09-06
  Filled 2019-09-09: qty 180, 30d supply, fill #0

## 2019-09-07 MED ORDER — GLUCAGON 0.5 MG/0.1 ML SUBCUTANEOUS SYRINGE
Freq: Once | SUBCUTANEOUS | 2 refills | 0 days | Status: CP | PRN
Start: 2019-09-07 — End: ?
  Filled 2019-09-09: qty 0.2, 2d supply, fill #0

## 2019-09-07 MED ORDER — INSULIN LISPRO (U-100) 100 UNIT/ML SUBCUTANEOUS SOLUTION
SUBCUTANEOUS | 0 refills | 40.00000 days | Status: CP
Start: 2019-09-07 — End: 2019-09-07

## 2019-09-07 MED ORDER — INSULIN LISPRO (U-100) 100 UNIT/ML SUBCUTANEOUS SOLUTION: mL | 0 refills | 40 days | Status: AC

## 2019-09-07 MED ORDER — CLINDAMYCIN 1 % LOTION
Freq: Every day | TOPICAL | 11 refills | 120 days | Status: CP
Start: 2019-09-07 — End: 2019-09-07

## 2019-09-07 MED ORDER — MULTIVIT-MINS NO.51-FA 100 MCG-VIT K 350 MCG-CO Q10 5 MG CHEW TABLET
ORAL_TABLET | Freq: Every day | ORAL | 0 refills | 30 days
Start: 2019-09-07 — End: ?

## 2019-09-07 MED ORDER — CHOLECALCIFEROL (VITAMIN D3) 125 MCG (5,000 UNIT) TABLET
ORAL_TABLET | Freq: Every day | ORAL | 11 refills | 30 days | Status: CP
Start: 2019-09-07 — End: 2020-09-06
  Filled 2019-09-09: qty 100, 100d supply, fill #0

## 2019-09-07 MED ORDER — RIFAXIMIN 550 MG TABLET
ORAL_TABLET | Freq: Two times a day (BID) | ORAL | 0 refills | 30.00000 days | Status: CP
Start: 2019-09-07 — End: ?
  Filled 2019-09-09: qty 60, 30d supply, fill #0

## 2019-09-07 MED ORDER — MELATONIN 3 MG TABLET
Freq: Every evening | ORAL | 0 refills | 0.00000 days
Start: 2019-09-07 — End: ?

## 2019-09-07 MED ORDER — VALGANCICLOVIR 450 MG TABLET
ORAL_TABLET | Freq: Every day | ORAL | 0 refills | 30.00000 days | Status: CP
Start: 2019-09-07 — End: 2019-10-07
  Filled 2019-09-09: qty 30, 30d supply, fill #0

## 2019-09-07 MED ORDER — CALCIUM CARBONATE 600 MG CALCIUM (1,500 MG) TABLET
Freq: Two times a day (BID) | ORAL | 0 refills | 0 days
Start: 2019-09-07 — End: ?

## 2019-09-07 MED ORDER — BD INSULIN SYRINGE HALF UNIT ULTRA-FINE 0.3 ML 31 GAUGE X 5/16" (8 MM)
12 refills | 0 days | Status: CP
Start: 2019-09-07 — End: ?

## 2019-09-07 MED ORDER — PREDNISONE 20 MG TABLET
ORAL_TABLET | Freq: Every day | ORAL | 0 refills | 30.00000 days | Status: CP
Start: 2019-09-07 — End: 2019-10-07
  Filled 2019-09-09: qty 60, 30d supply, fill #0

## 2019-09-07 MED ORDER — BLOOD SUGAR DIAGNOSTIC STRIPS
ORAL_STRIP | 12 refills | 0 days | Status: CP
Start: 2019-09-07 — End: ?

## 2019-09-07 MED ORDER — LANCETS
0 refills | 0 days | Status: CP
Start: 2019-09-07 — End: ?
  Filled 2019-09-09: qty 200, 33d supply, fill #0

## 2019-09-07 MED ORDER — PANTOPRAZOLE 40 MG TABLET,DELAYED RELEASE
ORAL_TABLET | Freq: Every day | ORAL | 0 refills | 30.00000 days | Status: CP
Start: 2019-09-07 — End: 2019-10-07
  Filled 2019-09-09: qty 30, 30d supply, fill #0

## 2019-09-07 NOTE — Unmapped (Signed)
Endocrine Virtual Care Note                  **This patient was not seen in person today. The Endocrine service has moved to a virtual model when possible to minimize potential spread of COVID-19, protect patients/providers and reduced PPE utilization.  During this time, we will be limiting person-to-person contact when possible.**     Patients chart, including labs, imaging, medications, vitals and other notes have been reviewed. Based on this review we have no new recommendations at this time.     Time spent reviewing chart: 5 minutes        Please page with questions or concerns: Endocrine fellow on call: [phone]    Time spent reviewing chart 5 minutes  Time spent with fellow 5 minutes

## 2019-09-07 NOTE — Unmapped (Signed)
Confidential Psychological Therapy Session  [facility]      Patient Name: [name] Gross  Medical Record Number: [id number]  Date of Service: September 07, 2019  Clinical Psychologist: Artemio Aly, PhD  Intern: None  Time Spent: 45 min of face-to-face counseling  CPT Procedure Code: 81191 (45 min psychotherapy with patient and/or family)  Therapy Type: Behavior Modifying/Cognitive Behavioral Therapy (CBT)  Purpose of Treatment: assessment of psychotherapy needs, adjustment to chronic medical illness, improve coping.    Referral/Relevant History:  [name] is a very pleasant 18 y.o.  male who presents for cognitive behavioral therapy to address adjustment to chronic medical illness (and recent hospitalization for liver transplant rejection). He was seen by the adult inpatient psychiatry team on 08/23/19 for a history of MDD (and two previous suicide attempts, last attempt April 2019) and current depressive symptoms. During assessment with psychiatry, [name] declined pharmacotherapy but stated that he would be interested in receiving therapy services while inpatient to work through his difficult medical diagnoses and family situation. He was seen by this provider for an initial visit on 08/29/19 and noted improved mood after reconnecting with social support, and requested continued psychology follow-up while inpatient. He was seen again by inpatient psychiatry on 09/01/19 and continued to decline pharmacotherapy.     Review of Symptoms/ROS: Deferred    Subjective:   [name] reported that his mood has been great, particularly after learning that his liver disease has stabilized, getting off the IV and being able to move independently, and learning that he may be discharged home soon. He denied current symptoms of depression or anxiety, but acknowledged that he had been depressed prior to this hospitalization and is interested in outpatient treatment to continue to work on improving his mood. Today, he expressed interest in both trialing an anti-depressant and engaging in outpatient therapy after discharge.     During today's visit, the topics of value-based behavior and cognitive restructuring were introduced and [name] was provided with handouts about these. He described having several values, including having relationships with adequate boundaries and maintaining independence, and ways to work towards these were discussed (e.g. cooking his own meals at home).     [name] noted that the liver transplant team met with him yesterday and emphasized his need for stable housing, which he agreed with. He reported that his plan is to live with his grandmother in [place], [place], as she has been visiting him in the hospital and has agreed to have him live with her. [name] believes this will be a stable situation, and plans to live with her indefinitely. He described being excited to be able to do several things independently while living with her, like cooking his own meals and working on getting income from [employer].    Objective:    Mental Status Exam:  Appearance: Malnourished and jaundiced  Motor: No abnormal movements  Speech/Language: Quiet speech, but normal rate and rhythm   Mood: Great  Affect: Sad and somewhat flat, but he noted that he has always been quiet and private at baseline  Thought Process: Logical, linear, clear, coherent, goal directed  Thought Content: Denies SI, HI, self harm, delusions, obsessions, paranoid ideation, or ideas of reference  Perceptual Disturbances: Denies auditory and visual hallucinations, behavior not concerning for response to internal stimuli  Orientation: Oriented to person, place, time, and general circumstances  Attention: Able to fully attend without fluctuations in consciousness  Concentration: Able to fully  concentrate and attend  Memory: Immediate, short-term, long-term, and recall grossly intact  Fund of Knowledge: Consistent with level of education and development  Insight: Intact  Judgment: Intact  Impulse Control: Intact      Assessment:  [name] participated well in this CBT session and exhibits good motivation towards treatment goals.     [name] described his mood as great, but described symptoms of both depression and anxiety prior to this hospitalization and for the first several weeks while being in the hospital. He also continues to look mildly depressed, as he is quiet and somewhat difficult to engage; however, he notes that this may be his baseline and that he is a quiet and private person in general. He appears to meet criteria for recurrent MDD, and may be in early remission at this time given recent improvements in the past two weeks. It is likely that continuing to promote ways to help [name] remain engaged and socially connected will be helpful during his hospitalization. Additionally, exploring his values in life and finding ways to help him maintain some independence will likely be very beneficial. Improvements in depression and anxiety symptoms can contribute to improved quality of life, functionality, and ability to cope with chronic illness.   ??  Focus on current treatment is reduction of depressive symptoms, increasing behavioral activation, adjustment to chronic medical conditions.   ??  Focus on future sessions will include values exploration, behavioral activation, learning cognitive coping strategies, relaxation strategies.  ??  Adherence concerns: While there were concerns about non-adherence to anti-rejection medications prior to this hospitalization, no current adherence concerns given inpatient status.  ??  Diagnostic Impression: Major Depressive Disorder, Recurrent Episode, Moderate Severity, in early remission    Risk Assessment:  A suicide and violence risk assessment was performed as part of this evaluation. The patient is deemed to be at chronic elevated risk for self-harm/suicide given the following factors: male age 76-35, current diagnosis of depression, previous acts of self-harm and chronic severe medical condition. The patient is deemed to be at chronic elevated risk for violence given the following factors: male gender and younger age. These risk factors are mitigated by the following factors:lack of active SI/HI, no know access to weapons or firearms, no history of violence, motivation for treatment, presence of an available support system and expresses purpose for living. There is no acute risk for suicide or violence at this time. The patient was educated about relevant modifiable risk factors including following recommendations for treatment of psychiatric illness and abstaining from substance abuse.    While future psychiatric events cannot be accurately predicted, the patient does not currently require  acute inpatient psychiatric care and does not currently meet [facility] involuntary commitment criteria.      Psychometric Testing: None administered today. Consider administration of PHQ-9 and/or GAD-7 in future sessions.    Plan:  Mr.  Cecchi will continue meeting with me for CBT until discharge, and was given instructions for therapeutic homework, including reviewing handouts provided about value-based behavior and cognitive restructuring.     Mr.  Blades will be seen approximately once/week until discharge. Given potential upcoming discharge at the end of this week or beginning of next week, this may be the final session while inpatient; if he is discharged prior to being seen next week, this writer will call pt next week to discuss transitioning to outpatient therapy.  Mr. Arnesen confirmed that he is interested in continuing to engage in therapy with Clinical research associate  following discharge.    Mr.  Jasperson was given this writer's contact information with confidential voice mail number and instructed to call [phone] for emergencies.

## 2019-09-07 NOTE — Unmapped (Signed)
Problem: Adult Inpatient Plan of Care  Goal: Plan of Care Review  Outcome: Progressing  No acute events noted. Afebrile, VSS. RUE PICC remains heparin locked. HS POCT resulted a glucose of 84 mg/dL. Lantus 10 units and 3 units of scheduled Humalog (pt only ate a partial meal) were given. [name] Gross verbalized he met his goal of 1.6L of fld intake. Discussed overnight POC, verbal agreement noted. Will continue to monitor.

## 2019-09-07 NOTE — Unmapped (Signed)
Pt afebrile, VSS this shift. No complaints of pain or nausea today. Blood sugars between 78-188 today, see MAR for details on insulin. Eating well, drinking well. No measured urine output today because every time he went to the bathroom, he stooled and voided, but three urine occurrences this shift. PICC heparin-locked, dressing c/d/I. Grandma at bedside for a while this afternoon and updated on POC. Will continue to monitor and follow POC.    Problem: Adult Inpatient Plan of Care  Goal: Plan of Care Review  Outcome: Progressing  Goal: Patient-Specific Goal (Individualization)  Outcome: Progressing  Goal: Absence of Hospital-Acquired Illness or Injury  Outcome: Progressing  Goal: Optimal Comfort and Wellbeing  Outcome: Progressing  Goal: Readiness for Transition of Care  Outcome: Progressing  Goal: Rounds/Family Conference  Outcome: Progressing     Problem: Wound  Goal: Optimal Wound Healing  Outcome: Progressing     Problem: Fall Injury Risk  Goal: Absence of Fall and Fall-Related Injury  Outcome: Progressing     Problem: Self-Care Deficit  Goal: Improved Ability to Complete Activities of Daily Living  Outcome: Progressing     Problem: Infection  Goal: Infection Symptom Resolution  Outcome: Progressing

## 2019-09-07 NOTE — Unmapped (Signed)
Pediatric Daily Progress Note     Assessment/Plan:     Principal Problem:    Liver transplant rejection (CMS-HCC)  Active Problems:    Recurrent major depressive disorder, in partial remission (CMS-HCC)    Malnutrition of mild degree (CMS-HCC)    History of liver transplant (CMS-HCC)    Transaminitis    Normocytic anemia    Hypoalbuminemia    Steroid-induced hyperglycemia    Pityrosporum folliculitis    Abdominal pain    Homeless    Thrombocytopenia (CMS-HCC)    Hypomagnesemia    Hypophosphatasia    Other ascites    MSSA bacteremia    Hyperbilirubinemia    Impetigo lesions of Right Ear and Posterior Neck    Steroid-induced acne    Liver dysfunction secondary to transplant rejection     Irritant contact dermatitis of the hands    Sleep difficulties    Strep Mitis Central line-associated bloodstream infection  Resolved Problems:    Cholangitis of transplanted liver (CMS-HCC)    Melena    Dark stools    Kastin??is a 18 y.o.??male??with history of??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 Along with steroids, tacrolimus, and cellcept, thyroglobulin treatment was initiated (on 10/01-10/12) but yielded limited benefit in liver studies to date. Patient has discussed prognosis with Dr. Melrose Nakayama and palliative care including the possibility of not recovering from liver rejection (10/9). Patient understands that transplant is an option but that he may not be a candidate. His main active issue currently is treating CLABSI secondary to strep mitis and MSSA (found after cultures obtained secondary to leukocytosis on daily labs) with Unasyn for a total of a 14 day course (through 10/27) and salvaging his PICC line for now given difficulty with access. Due to his infection all of his immunosuppressants (thymoblobulin, Cellcept, tacrolimus), were held, and his steroids were weaned. On 10/19, his immunosuppressants were restarted, and repeat liver biopsy was performed and was consistent with chronic rejection. His other active issues are complications secondary to liver dysfunction (hypoalbuminemia, ascites, edema, abdominal pain, elevated INR, electrolyte abnormalities) and subacute lymphopenia and thrombocytopenia (likely due to a combination of myelosuppressive meds and chronic illness, may improve after he finishes Unasyn course). Currently his ascites/edema is stable; we will continue to monitor closely and consider albumin/lasix as needed. He also has steroid-induced hyperglycemia, currently being managed on insulin with daily adjustments. The liver transplant committee determined patient should proceed with complete liver transplant evaluation. He and his grandmother met with the transplant team 10/28.    #Hx Liver Transplant - Subacute/Acute Liver Failure: ??Biopsy confirmed severe acute rejection (9/25), RAI 9/9. Liver labs without a significant improvement after immunosuppression. S/p 12 days of ATG and high dose MethylPrednisolone (10/1-10/12) with poor overall response to therapy. Labs 10/26 with D-dimer elevated to 1067, ammonia <9 (46), GGT 442 (465), Tbili 32.5 (33.1).  Liver biopsy completed on 10/21 which showed ductopenia with duct loss involving more than 50% of portal tracts (8 of 12 portal areas, 66.7%), degenerative changes in remaining bile ducts and irregular areas of periportal and centrilobular hepatocyte dropout and fibrosis with fibrous bridging, consistent with chronic rejection.   - F/u 10/21 liver bx C3D stain sent to [facility]  -Tacro and Mycophenolate restarted 10/20    -Tac 4 mg BID   -F/u today's 10/27 tac trough, appreciate pharmacy assistance  - Continue prednisone 40mg  today  -  Continue Valgancyclovir 450mg  daily (3months), Bactrim MWF (28mo) for prophylaxis (10/2-) and Nystatin 10ml TID (42mo) prophylaxis (10/2-)  - Discontinue Vitamin K (10/15-10/25) given no significant improvement in PT/INR over the last week   - F/u Des-gamma-carboxy-prothrombin- if pt low in vit K, should result as elevated   - F/u factor V and VII  - Protonix 40 mg PO daily  - Continue Rifaximin   - PT/INR, CBC, CMP on M/Th   - AFP not completed bc sample icteric  - 10/21 liver transplant committee determined patient should proceed with complete liver transplant evaluation  - 10/28 liver transplant team met with patient and grandma     #CLABSI: Strep mitis and MSSA   Developed sudden leukocytosis with white count elevated to 17.3 10/12, afebrile, non-toxic appearing, without localizing symptoms. Found to have positive Bcx 10/12 growing strep mitis and MSSA. Given ascites and risk for SBP secondary to strep mitis, also covering for anaerobes. Echo negative for endocarditis. Given patient had low WBC to 2.9 on 10/22, BCx repeated.  - 10/22 BCx NGTD  - CBC M/Th  - 10/12 peripheral/PICC Cx positive for Strep mitis   - 10/12 peripheral - +MSAA; 10/13 - NGTD  - 10/22-  NGTD  - Per ID recs:   - Unasyn x 14 days total (10/13-10/27)   - Plan for PICC removal prior to discharge    #Hypoalbuminemia- edema, ascites, secondary to liver dysfunction. Improved ascites s/p 25 g albumin and 40 mg lasix 10/26  - Following daily weights  - Strict I/Os      #Nutriton: severe protein-calorie malnutrition. Prior to admission, patient has been skipping meals due to poor appetite and food insecurity. Patient continues to have good appetite.  - Continue goal PO fluid intake of 1.6L   - Regular diet +  Ensure supplements PRN  - Vit D3 5,000 units daily  - Aquadex multivitamin    - Ca 300 mg BID    #Hypomagnesemia, hypophosphatemia:   - qMTh CMP, Mg, Phos  - Hypomagnesemia: Mag sulphate IV prn for mg<2, Daily PO mag oxide 280 mg BID  - Hypophosphatemia: Neutra phos TID      #Constipation, resolved:   - Miralax 17 g BID PRN     #Steroid induced hyperglycemia:??A1c??4.9% but with postprandial hyperglycemia. Still on PO prednisone 40mg  daily.   -Lantus 10 units at bedtime (give 6 units if NPO)  -Restart nutritional insulin when eating: Lispro 4 units with meals (Hold if NPO)  -Continue PRN Lispro dose for overnight meal: 4 units for normal-large meal, 2 units for smaller meal  -Continue Lispro correction 1:50>150 ACHS  -Endocrine following, appreciate recs    #Thrombocytopenia & lymphopenia: last plt transfusion 10/21 prior to liver biopsy  - Per ID, may improve after finishing unasyn course (which can cause myelosuppression). Bactrim can also cause myelosuppression, but ID would not recommend alternative agent for PJP ppx other than bactrim.  - CBCd M/Th  - Consider CBCd sooner if bleeding    #Impetigo Skin Lesions on Right Ear and Left posterior Neck, + MSSA, healing well   - Mupirocin ointment for both lesions until healed, per derm    #Steroid-induced acne, possible fungal folliculitis   - Topical clindamycin, benzoyl peroxide, Ketoconazole shampoo    ??  #Major Depressive Disorder, insomnia: Stable. Has hx of attempted overdoses, with the last attempt in April 2019??at which point he was hospitalized in the Adolescent Psychiatry unit and discharged on Lexapro 20 mg. ??He has not engaged with  mental health resources or taken medication for depression or insomnia in 8-10 months.????Denies SI, passive suicidality, self-harm, or HI at this time.    - Patient interested in meeting with adult psychiatry team again to consider medications. They will see him in the AM of 10/29  - Plan for outpatient f/u with psychiatry  - Melatonin 3 mg q1800 for sleep and circadian rhythm regulation    #Deconditioning. Patient endorses feeling weak and having limited mobility.   -PT following  -Encourage OOB and ambulation    #Homelessness - Social Concerns:??[name] has experienced homelessness for ~5 months when he left his mother's house. He has severed all ties with his family and states his relationship with his mother cannot be reconciled. He also requested contact information of family members to be erased from his chart (this has been completed, but social worker did drop contact info into her note on 9/24) and he more recently has reconciled with some family members. ??In addition to food insecurity, he has not been able to secure medications. ??He is routinely exposed to alcohol and drugs (marijuana, cocaine, etc.) second-hand through his friendships though he denies current use himself. ??On urine drug screen, he tested positive for marijuana only.  -Supportive care following.  -Designated a personal friend [name]) and grandfather to be his HCPOAs (Friend is the Primary).   -Spoke with sister on 10/16 (has not been in regular contact)   -Paternal grandmother now visiting regularly   -Will start discussing potential discharge plan: As of now, plan is for patient to discharge home with grandma  ??  Access: PICC (placed 9/28), will need removal prior to discharge  ??  Discharge criteria:??Safe discharge planning    Subjective:  No acute events overnight. Patient finished his course of Unasyn. Says his right foot is still pretty swollen > than his left foot. Met with psychiatry for evaluation for transplant. Patient plans to discharge to his grandmother's house.     Objective:     Vital signs in last 24 hours:  Temp:  [36.8 ??C-37.1 ??C] 36.8 ??C  Heart Rate:  [90-108] 90  Resp:  [20-22] 20  BP: (107-124)/(53-80) 117/53  MAP (mmHg):  [72-92] 92  SpO2:  [98 %-100 %] 98 %  Intake/Output last 3 shifts:  I/O last 3 completed shifts:  In: 2319 [P.O.:2000; I.V.:319]  Out: 701 [Urine:700; Stool:1]    Physical Exam:   General:  Lying in bed. Thin appearing. Interactive and conversational. Has bandage in place over his right ear.   Head: Normocephalic, atraumatic.  Eyes: EOMI. Marked scleral icterus present.   Nose:   Clear, no discharge  Oropharynx: Thrush present in back of throat over uvula   Lungs: Clear breath sounds bilaterally, breathing comfortably on room air.   Heart: regular rate and rhythm. No murmurs appreciated on auscultation.  Abdomen:  Abdomen less protuberant. Ascites present but improved s/p albumin/lasix 10/26. No HSM.   Neuro: Deconditioned, moves all extremities with decreased strength. Persistent tremor present in his bilateral upper extremities.   Extremities: Non-pitting edema in the LE R>L, WWP  Skin: Skin lesions on left neck, healing well. Skin lesion present on right ear with scab, covered in gauze that is c/d/i. Acneform rash present on face, back, chest.     Active Medications reviewed and KEY Medications include:     Current Facility-Administered Medications:   ???  benzoyl peroxide 5 % gel, , Topical, Nightly, Alfredia Ferguson, MD  ???  calcium carbonate (OS-CAL) tablet 300 mg  elem calcium, 300 mg elem calcium, Oral, BID, David Stall, MD, 300 mg elem calcium at 09/06/19 2103  ???  cholecalciferol (vitamin D3) tablet 5,000 Units, 5,000 Units, Oral, Daily, David Stall, MD, 5,000 Units at 09/06/19 0981  ???  clindamycin (CLEOCIN T) 1 % lotion 1 application, 1 application, Topical, daily, Alfredia Ferguson, MD, 1 application at 09/03/19 1628  ???  dextrose 50 % in water (D50W) 50 % solution 12.5 g, 12.5 g, Intravenous, Q10 Min PRN, David Stall, MD  ???  heparin preservative-free injection 10 units/mL syringe (HEPARIN LOCK FLUSH), 20 Units, Intravenous, Daily PRN, David Stall, MD, 20 Units at 09/06/19 1355  ???  influenza vaccine quad (FLUARIX, FLULAVAL, FLUZONE) (6 MOS & UP) 2020-21, 0.5 mL, Intramuscular, During hospitalization, David Stall, MD  ???  insulin glargine (LANTUS) injection 10 Units, 10 Units, Subcutaneous, Nightly, Lovie Chol, MD, 10 Units at 09/06/19 2214  ???  insulin lispro (HumaLOG) injection 0-10 Units, 0-10 Units, Subcutaneous, 5XD insulin, Lovie Chol, MD, 2 Units at 09/06/19 1401  ???  insulin lispro (HumaLOG) injection 6 Units, 6 Units, Subcutaneous, 5XD insulin, Chinelo C Okigbo, MD, 3 Units at 09/06/19 2215  ???  ketoconazole (NIZORAL) 2 % shampoo, , Topical, daily, Alfredia Ferguson, MD  ???  magnesium oxide (MAG-OX) capsule 280 mg, 280 mg, Oral, BID, Alfredia Ferguson, MD, 280 mg at 09/06/19 2012  ???  melatonin tablet 3 mg, 3 mg, Oral, QPM, Jonna Clark, MD, 3 mg at 09/06/19 2016  ???  multivitamin, with zinc (AQUADEKS) chewable tablet, 2 tablet, Oral, Daily, David Stall, MD, 2 tablet at 09/06/19 1914  ???  mupirocin (BACTROBAN) 2 % ointment, , Topical, TID, David Stall, MD  ???  mycophenolate (MYFORTIC) EC tablet 540 mg, 540 mg, Oral, BID, David Stall, MD, 540 mg at 09/06/19 2017  ???  nystatin (MYCOSTATIN) oral suspension, 1,000,000 Units, Oral, TID, David Stall, MD, 1,000,000 Units at 09/06/19 2018  ???  ondansetron (ZOFRAN) injection 4 mg, 4 mg, Intravenous, Once PRN, Purvis Kilts, MD  ???  ondansetron (ZOFRAN-ODT) disintegrating tablet 4 mg, 4 mg, Oral, Q8H PRN, David Stall, MD  ???  pantoprazole (PROTONIX) EC tablet 40 mg, 40 mg, Oral, Daily, David Stall, MD, 40 mg at 09/06/19 2103  ???  polyethylene glycol (MIRALAX) packet 17 g, 17 g, Oral, BID PRN, David Stall, MD  ???  potassium & sodium phosphates 250mg  (PHOS-NAK/NEUTRA PHOS) packet 2 packet, 2 packet, Oral, TID, Lewie Loron, MD, 2 packet at 09/06/19 2014  ???  predniSONE (DELTASONE) tablet 40 mg, 40 mg, Oral, Daily, David Stall, MD, 40 mg at 09/06/19 7829  ???  promethazine (PHENERGAN) 1 mg/mL pediatric dilution injection 6.25 mg, 6.25 mg, Intravenous, Q15 Min PRN, Purvis Kilts, MD  ???  rifAXIMin Burman Blacksmith) tablet 550 mg, 550 mg, Oral, BID, David Stall, MD, 550 mg at 09/06/19 2012  ???  sodium chloride (NS) 0.9 % infusion, , Intravenous, Continuous, David Stall, MD, Last Rate: 10 mL/hr at 08/26/19 0537, 1,000 mL at 08/27/19 0731  ???  sodium chloride (NS) 0.9 % infusion, 10 mL/hr, Intravenous, Continuous, Ejiofor A Lucky Cowboy., MD, Last Rate: 10 mL/hr at 09/04/19 1013, 10 mL/hr at 09/04/19 1013  ???  sulfamethoxazole-trimethoprim (BACTRIM) 400-80 mg tablet 80 mg of trimethoprim, 1 tablet, Oral, Q MWF, David Stall, MD, 80 mg of trimethoprim at 09/05/19 0849  ???  tacrolimus (PROGRAF) capsule 5 mg,  5 mg, Oral, BID, Alfredia Ferguson, MD, 5 mg at 09/06/19 2011  ???  valGANciclovir (VALCYTE) tablet 450 mg, 450 mg, Oral, Daily, David Stall, MD, 450 mg at 09/06/19 1725        Studies: Personally reviewed and interpreted.    Labs/Studies:  Lab Results   Component Value Date    WBC 1.4 (L) 09/05/2019    HGB 8.9 (L) 09/05/2019    HCT 26.3 (L) 09/05/2019    PLT 16 (L) 09/05/2019       Lab Results   Component Value Date    NA 136 09/05/2019    K 4.3 09/05/2019    CL 105 09/05/2019    CO2  09/05/2019      Comment:      Specimen icteric.    BUN 16 09/05/2019    CREATININE 0.86 09/05/2019    GLU 217 (H) 09/05/2019    CALCIUM 8.2 (L) 09/05/2019    MG 1.4 (L) 09/05/2019    PHOS 4.0 09/05/2019       Lab Results   Component Value Date    BILITOT 32.5 (H) 09/05/2019    BILIDIR 30.50 (H) 09/04/2019    PROT  09/05/2019      Comment:      Specimen icteric.        ALBUMIN 2.5 (L) 09/05/2019    ALT  09/05/2019      Comment:      Specimen icteric.    AST 114 (H) 09/05/2019    ALKPHOS 781 (H) 09/05/2019    GGT 389 (H) 09/06/2019       Lab Results   Component Value Date    PT 17.3 (H) 09/05/2019    INR 1.49 09/05/2019    APTT 25.0 (L) 08/14/2019       Allen Kell, MD  Pediatric Resident, PGY-1  Pager: [phone]

## 2019-09-07 NOTE — Unmapped (Signed)
Pt remains afebrile with stable vitals. PICC site CDI, dressing changed without issues. Not eating too much, drinking some. Urine output appropriate per patient, BM x1. No emesis, denies pain. Swelling to bilateral lower extremities remains up to ankles +1 to feet. Jaundice remains to sclera. Abdomen distended non tender. Blood sugars have been monitored, pt has been pleasant and cooperative in discussing cares, see labs for values. Punch site from biopsy CDI. Dressing to right ear remains, pt did not want to change until this evening, no active drainage noted. No family calls. Will continue to monitor and follow POC.       Problem: Adult Inpatient Plan of Care  Goal: Plan of Care Review  Outcome: Progressing  Goal: Patient-Specific Goal (Individualization)  Outcome: Progressing  Goal: Absence of Hospital-Acquired Illness or Injury  Outcome: Progressing  Goal: Optimal Comfort and Wellbeing  Outcome: Progressing  Goal: Readiness for Transition of Care  Outcome: Progressing  Goal: Rounds/Family Conference  Outcome: Progressing     Problem: Wound  Goal: Optimal Wound Healing  Outcome: Progressing     Problem: Fall Injury Risk  Goal: Absence of Fall and Fall-Related Injury  Outcome: Progressing     Problem: Self-Care Deficit  Goal: Improved Ability to Complete Activities of Daily Living  Outcome: Progressing     Problem: Infection  Goal: Infection Symptom Resolution  Outcome: Progressing

## 2019-09-08 LAB — COMPREHENSIVE METABOLIC PANEL
ALBUMIN: 2.5 g/dL — ABNORMAL LOW (ref 3.5–5.0)
ALKALINE PHOSPHATASE: 666 U/L — ABNORMAL HIGH (ref 65–260)
AST (SGOT): 103 U/L — ABNORMAL HIGH (ref 19–55)
BILIRUBIN TOTAL: 31.1 mg/dL — ABNORMAL HIGH (ref 0.0–1.2)
BLOOD UREA NITROGEN: 20 mg/dL (ref 7–21)
CALCIUM: 8 mg/dL — ABNORMAL LOW (ref 8.5–10.2)
CHLORIDE: 106 mmol/L (ref 98–107)
CREATININE: 0.97 mg/dL (ref 0.70–1.30)
EGFR CKD-EPI NON-AA MALE: >90 mL/min/{1.73_m2} (ref >=60)
GLUCOSE RANDOM: 90 mg/dL (ref 70–179)
SODIUM: 134 mmol/L — ABNORMAL LOW (ref 135–145)

## 2019-09-08 LAB — CBC W/ AUTO DIFF
BASOPHILS ABSOLUTE COUNT: 0 10*9/L (ref 0.0–0.1)
EOSINOPHILS RELATIVE PERCENT: 0 %
HEMATOCRIT: 24.4 % — ABNORMAL LOW (ref 41.0–53.0)
HEMOGLOBIN: 7.9 g/dL — ABNORMAL LOW (ref 13.5–17.5)
LARGE UNSTAINED CELLS: 3 % (ref 0–4)
LYMPHOCYTES ABSOLUTE COUNT: 0.7 10*9/L — ABNORMAL LOW (ref 1.5–5.0)
LYMPHOCYTES RELATIVE PERCENT: 33.3 %
MEAN CORPUSCULAR HEMOGLOBIN CONC: 32.6 g/dL (ref 31.0–37.0)
MEAN CORPUSCULAR HEMOGLOBIN: 30.5 pg (ref 26.0–34.0)
MEAN CORPUSCULAR VOLUME: 93.5 fL (ref 80.0–100.0)
MEAN PLATELET VOLUME: 8.6 fL (ref 7.0–10.0)
MONOCYTES ABSOLUTE COUNT: 0.1 10*9/L — ABNORMAL LOW (ref 0.2–0.8)
MONOCYTES RELATIVE PERCENT: 4.5 %
NEUTROPHILS ABSOLUTE COUNT: 1.3 10*9/L — ABNORMAL LOW (ref 2.0–7.5)
NEUTROPHILS RELATIVE PERCENT: 59 %
PLATELET COUNT: 40 10*9/L — ABNORMAL LOW (ref 150–440)
RED BLOOD CELL COUNT: 2.61 10*12/L — ABNORMAL LOW (ref 4.50–5.90)
RED CELL DISTRIBUTION WIDTH: 30 % — ABNORMAL HIGH (ref 12.0–15.0)
WBC ADJUSTED: 2.2 10*9/L — ABNORMAL LOW (ref 4.5–11.0)

## 2019-09-08 LAB — CO2: Carbon dioxide:SCnc:Pt:Ser/Plas:Qn:: 0

## 2019-09-08 LAB — MEAN CORPUSCULAR HEMOGLOBIN CONC: Lab: 32.6

## 2019-09-08 LAB — INR: Coagulation tissue factor induced.INR:RelTime:Pt:PPP:Qn:Coag: 1.63

## 2019-09-08 LAB — MAGNESIUM: Magnesium:MCnc:Pt:Ser/Plas:Qn:: 1.1 — ABNORMAL LOW

## 2019-09-08 LAB — GAMMA GT: GAMMA GLUTAMYL TRANSFERASE: 382 U/L — ABNORMAL HIGH (ref 12–109)

## 2019-09-08 LAB — GAMMA GLUTAMYL TRANSFERASE: Gamma glutamyl transferase:CCnc:Pt:Ser/Plas:Qn:: 382 — ABNORMAL HIGH

## 2019-09-08 LAB — PHOSPHORUS: Phosphate:MCnc:Pt:Ser/Plas:Qn:: 3.2

## 2019-09-08 MED ORDER — CLINDAMYCIN 1.2 % (1 % BASE)-BENZOYL PEROXIDE 5 % TOPICAL GEL
0 refills | 0 days | Status: CP
Start: 2019-09-08 — End: ?
  Filled 2019-09-09: qty 45, 15d supply, fill #0

## 2019-09-08 NOTE — Unmapped (Signed)
Problem: Adult Inpatient Plan of Care  Goal: Plan of Care Review  Outcome: Progressing   No acute events noted. Afebrile. VSS. RUE PICC and Liver biopsy sites w/o s/s of complications. HS POCT glucose resulted at 108 mg/dL. Scheduled Lantus 10 units given. Scheduled Humalog(6units) was decreased to 3 units per instructions. Rudi only ate half of his dinner. Discussed overnight PO. AM lab collection pending for 0600. No family at bedside. Will continue to monitor.

## 2019-09-08 NOTE — Unmapped (Signed)
Pediatric Daily Progress Note     Assessment/Plan:     Principal Problem:    Liver transplant rejection (CMS-HCC)  Active Problems:    Recurrent major depressive disorder, in partial remission (CMS-HCC)    Malnutrition of mild degree (CMS-HCC)    History of liver transplant (CMS-HCC)    Transaminitis    Normocytic anemia    Hypoalbuminemia    Steroid-induced hyperglycemia    Pityrosporum folliculitis    Abdominal pain    Homeless    Thrombocytopenia (CMS-HCC)    Hypomagnesemia    Hypophosphatasia    Other ascites    MSSA bacteremia    Hyperbilirubinemia    Impetigo lesions of Right Ear and Posterior Neck    Steroid-induced acne    Liver dysfunction secondary to transplant rejection     Irritant contact dermatitis of the hands    Sleep difficulties    Strep Mitis Central line-associated bloodstream infection  Resolved Problems:    Cholangitis of transplanted liver (CMS-HCC)    Melena    Dark stools    Heyward??is a 18 y.o.??male??with history of??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 Along with steroids, tacrolimus, and cellcept, thyroglobulin treatment was initiated (on 10/01-10/12) but yielded limited benefit in liver studies to date. Patient has discussed prognosis with Dr. Melrose Nakayama and palliative care including the possibility of not recovering from liver rejection (10/9). Patient understands that transplant is an option but that he may not be a candidate. His main active issue currently is treating CLABSI secondary to strep mitis and MSSA (found after cultures obtained secondary to leukocytosis on daily labs) with Unasyn for a total of a 14 day course (through 10/27) and salvaging his PICC line for now given difficulty with access. Due to his infection all of his immunosuppressants (thymoblobulin, Cellcept, tacrolimus), were held, and his steroids were weaned. On 10/19, his immunosuppressants were restarted, and repeat liver biopsy was performed and was consistent with chronic rejection. His other active issues are complications secondary to liver dysfunction (hypoalbuminemia, ascites, edema, abdominal pain, elevated INR, electrolyte abnormalities) and subacute lymphopenia and thrombocytopenia (likely due to a combination of myelosuppressive meds and chronic illness, may improve after he finishes Unasyn course). Currently his ascites/edema is stable; we will continue to monitor closely and consider albumin/lasix as needed. He also has steroid-induced hyperglycemia, currently being managed on insulin with daily adjustments. The liver transplant committee determined patient should proceed with complete liver transplant evaluation. He and his grandmother met with the transplant team 10/28.     #Hx Liver Transplant - Subacute/Acute Liver Failure: ??Biopsy confirmed severe acute rejection (9/25), RAI 9/9. Liver labs without a significant improvement after immunosuppression. S/p 12 days of ATG and high dose MethylPrednisolone (10/1-10/12) with poor overall response to therapy. Labs 10/26 with D-dimer elevated to 1067, ammonia <9 (46), GGT 442 (465), Tbili 32.5 (33.1).  Liver biopsy completed on 10/21 which showed ductopenia with duct loss involving more than 50% of portal tracts (8 of 12 portal areas, 66.7%), degenerative changes in remaining bile ducts and irregular areas of periportal and centrilobular hepatocyte dropout and fibrosis with fibrous bridging, consistent with chronic rejection.   - F/u 10/21 liver bx C3D stain sent to [facility]  -Tacro and Mycophenolate restarted 10/20    -Tac 5 mg BID   -F/u today's 10/30 tac trough, appreciate pharmacy assistance  - Continue prednisone 40mg  today  -  Continue Valgancyclovir 450mg  daily (3months), Bactrim MWF (59mo) for prophylaxis (10/2-) and Nystatin 10ml TID (85mo) prophylaxis (10/2-)   -2/2 to myelosuppression, will talk with pharmacy about switching pt from Bactrim to inhaled pentamidine for ppx  - Discontinue Vitamin K (10/15-10/25) given no significant improvement in PT/INR over the last week   - F/u Des-gamma-carboxy-prothrombin- if pt low in vit K, should result as elevated   - F/u factor V and VII  - Protonix 40 mg PO daily  - Continue Rifaximin   - PT/INR, CBC, CMP on M/Th   - AFP not completed bc sample icteric  - 10/21 liver transplant committee determined patient should proceed with complete liver transplant evaluation  - 10/28 liver transplant team met with patient and grandma   -Attempting to have pt have PFTs done with adult team today, 10/29     #CLABSI: Strep mitis and MSSA   Developed sudden leukocytosis with white count elevated to 17.3 10/12, afebrile, non-toxic appearing, without localizing symptoms. Found to have positive Bcx 10/12 growing strep mitis and MSSA. Given ascites and risk for SBP secondary to strep mitis, also covering for anaerobes. Echo negative for endocarditis. Given patient had low WBC to 2.9 on 10/22, BCx repeated.  - 10/22 BCx NGTD  - CBC M/Th  - 10/12 peripheral/PICC Cx positive for Strep mitis   - 10/12 peripheral - +MSAA; 10/13 - NGTD  - 10/22-  NGTD  - Per ID recs:   - Unasyn x 14 days total (10/13-10/27)   - Plan for PICC removal prior to discharge    #Hypoalbuminemia- edema, ascites, secondary to liver dysfunction. Improved ascites s/p 25 g albumin and 40 mg lasix 10/26  - Following daily weights  - Strict I/Os      #Nutriton: severe protein-calorie malnutrition. Prior to admission, patient has been skipping meals due to poor appetite and food insecurity. Patient continues to have good appetite.  - Continue goal PO fluid intake of 1.6L   - Regular diet +  Ensure supplements PRN  - Vit D3 5,000 units daily  - Aquadex multivitamin    - Ca 300 mg BID    #Hypomagnesemia, hypophosphatemia:   - qMTh CMP, Mg, Phos  - Hypomagnesemia: Mag sulphate IV prn for mg<2, Daily PO mag oxide 280 mg BID  - Hypophosphatemia: Neutra phos TID      #Constipation, resolved:   - Miralax 17 g BID PRN     #Steroid induced hyperglycemia:??A1c??4.9% but with postprandial hyperglycemia. Still on PO prednisone 40mg  daily.   -Lantus 10 units at bedtime (give 6 units if NPO)  -Restart nutritional insulin when eating: Lispro 4 units with meals (Hold if NPO)  -Continue PRN Lispro dose for overnight meal: 4 units for normal-large meal, 2 units for smaller meal  -Continue Lispro correction 1:50>150 ACHS  -Endocrine following, appreciate recs    #Thrombocytopenia & lymphopenia: last plt transfusion 10/21 prior to liver biopsy  - Per ID, may improve after finishing unasyn course (which can cause myelosuppression). Bactrim can also cause myelosuppression, but ID would not recommend alternative agent for PJP ppx other than bactrim.  - CBCd M/Th  - Consider CBCd sooner if bleeding    #Impetigo Skin Lesions on Right Ear and Left posterior Neck, + MSSA, healing well   - Mupirocin ointment for both lesions until healed, per derm    #Steroid-induced acne, possible fungal folliculitis   - Topical clindamycin, benzoyl peroxide, Ketoconazole shampoo    ??  #Major Depressive Disorder, insomnia: Stable.  Has hx of attempted overdoses, with the last attempt in April 2019??at which point he was hospitalized in the Adolescent Psychiatry unit and discharged on Lexapro 20 mg. ??He has not engaged with mental health resources or taken medication for depression or insomnia in 8-10 months.????Denies SI, passive suicidality, self-harm, or HI at this time.    - Patient met with adult psychiatry this morning. Will f/u with them about med recs and f/u plan  - Melatonin 3 mg q1800 for sleep and circadian rhythm regulation    #Deconditioning. Patient endorses feeling weak and having limited mobility.   -PT following  -Encourage OOB and ambulation    #Homelessness - Social Concerns:??Zerek has experienced homelessness for ~5 months when he left his mother's house. He has severed all ties with his family and states his relationship with his mother cannot be reconciled. He also requested contact information of family members to be erased from his chart (this has been completed, but social worker did drop contact info into her note on 9/24) and he more recently has reconciled with some family members. ??In addition to food insecurity, he has not been able to secure medications. ??He is routinely exposed to alcohol and drugs (marijuana, cocaine, etc.) second-hand through his friendships though he denies current use himself. ??On urine drug screen, he tested positive for marijuana only.  -Supportive care following.  -Designated a personal friend [name]) and grandfather to be his HCPOAs (Friend is the Primary).   -Spoke with sister on 10/16 (has not been in regular contact)   -Paternal grandmother now visiting regularly   -Will start discussing potential discharge plan: As of now, plan is for patient to discharge home with grandma  ??  Access: PICC (placed 9/28), will need removal prior to discharge  ??  Discharge criteria:??Safe discharge planning, tentatively tomorrow, 10/30 with his grandmother    Subjective:  No acute events overnight. Met with psychiatry to discuss restarting medications. Patient says the meeting went well and he is amenable to restarting medications. He has no other questions or concerns.     Objective:     Vital signs in last 24 hours:  Temp:  [36.7 ??C-37.2 ??C] 36.7 ??C  Heart Rate:  [76-91] 76  Resp:  [17-21] 17  BP: (112-116)/(54-62) 112/54  MAP (mmHg):  [71-76] 71  SpO2:  [99 %-100 %] 99 %  Intake/Output last 3 shifts:  I/O last 3 completed shifts:  In: 360 [P.O.:360]  Out: 700 [Urine:700]    Physical Exam:   General:  Lying in bed. Thin appearing. Interactive and conversational. Has bandage in place over his right ear.   Head: Normocephalic, atraumatic.  Eyes: EOMI. Marked scleral icterus present. Nose:   Clear, no discharge  Oropharynx: Thrush present in back of throat over uvula   Lungs: Clear breath sounds bilaterally, breathing comfortably on room air.   Heart: regular rate and rhythm. No murmurs appreciated on auscultation.  Abdomen:  Abdomen less protuberant. Ascites present but improved s/p albumin/lasix 10/26. No HSM.   Neuro: Deconditioned, moves all extremities with decreased strength. Persistent tremor present in his bilateral upper extremities.   Extremities: Non-pitting edema in the LE R>L, WWP  Skin: Skin lesions on left neck, healing well. Skin lesion present on right ear with scab, covered in gauze that is c/d/i. Acneform rash present on face, back, chest.     Active Medications reviewed and KEY Medications include:     Current Facility-Administered Medications:   ???  benzoyl peroxide 5 % gel, ,  Topical, Nightly, Alfredia Ferguson, MD  ???  calcium carbonate (OS-CAL) tablet 300 mg elem calcium, 300 mg elem calcium, Oral, BID, David Stall, MD, 300 mg elem calcium at 09/07/19 2112  ???  cholecalciferol (vitamin D3) tablet 5,000 Units, 5,000 Units, Oral, Daily, David Stall, MD, 5,000 Units at 09/08/19 4696  ???  clindamycin (CLEOCIN T) 1 % lotion 1 application, 1 application, Topical, daily, Alfredia Ferguson, MD, 1 application at 09/03/19 1628  ???  dextrose 50 % in water (D50W) 50 % solution 12.5 g, 12.5 g, Intravenous, Q10 Min PRN, David Stall, MD  ???  heparin preservative-free injection 10 units/mL syringe (HEPARIN LOCK FLUSH), 20 Units, Intravenous, Daily PRN, David Stall, MD, 20 Units at 09/08/19 0530  ???  influenza vaccine quad (FLUARIX, FLULAVAL, FLUZONE) (6 MOS & UP) 2020-21, 0.5 mL, Intramuscular, During hospitalization, David Stall, MD  ???  insulin glargine (LANTUS) injection 10 Units, 10 Units, Subcutaneous, Nightly, Lovie Chol, MD, 10 Units at 09/07/19 2128  ???  insulin lispro (HumaLOG) injection 0-10 Units, 0-10 Units, Subcutaneous, 5XD insulin, Chinelo C Okigbo, MD, 4 Units at 09/07/19 1831  ???  insulin lispro (HumaLOG) injection 3-6 Units, 3-6 Units, Subcutaneous, 5XD insulin, Randall Hiss, MD  ???  ketoconazole (NIZORAL) 2 % shampoo, , Topical, daily, Alfredia Ferguson, MD  ???  magnesium oxide (MAG-OX) capsule 280 mg, 280 mg, Oral, BID, Alfredia Ferguson, MD, 280 mg at 09/08/19 2952  ???  melatonin tablet 3 mg, 3 mg, Oral, QPM, Jonna Clark, MD, 3 mg at 09/07/19 2111  ???  multivitamin, with zinc (AQUADEKS) chewable tablet, 2 tablet, Oral, Daily, David Stall, MD, 2 tablet at 09/08/19 8413  ???  mupirocin (BACTROBAN) 2 % ointment, , Topical, TID, David Stall, MD, 1 application at 09/08/19 [phone]  ???  mycophenolate (MYFORTIC) EC tablet 540 mg, 540 mg, Oral, BID, David Stall, MD, 540 mg at 09/08/19 0845  ???  nystatin (MYCOSTATIN) oral suspension, 1,000,000 Units, Oral, TID, David Stall, MD, 1,000,000 Units at 09/08/19 0934  ???  ondansetron [facility]) injection 4 mg, 4 mg, Intravenous, Once PRN, Purvis Kilts, MD  ???  ondansetron (ZOFRAN-ODT) disintegrating tablet 4 mg, 4 mg, Oral, Q8H PRN, David Stall, MD  ???  pantoprazole (PROTONIX) EC tablet 40 mg, 40 mg, Oral, Daily, David Stall, MD, 40 mg at 09/07/19 2112  ???  polyethylene glycol (MIRALAX) packet 17 g, 17 g, Oral, BID PRN, David Stall, MD  ???  potassium & sodium phosphates 250mg  (PHOS-NAK/NEUTRA PHOS) packet 2 packet, 2 packet, Oral, TID, Lewie Loron, MD, 2 packet at 09/08/19 0935  ???  predniSONE (DELTASONE) tablet 40 mg, 40 mg, Oral, Daily, David Stall, MD, 40 mg at 09/08/19 0843  ???  promethazine (PHENERGAN) 1 mg/mL pediatric dilution injection 6.25 mg, 6.25 mg, Intravenous, Q15 Min PRN, Purvis Kilts, MD  ???  rifAXIMin [facility]) tablet 550 mg, 550 mg, Oral, BID, David Stall, MD, 550 mg at 09/08/19 0847  ???  sodium chloride (NS) 0.9 % infusion, , Intravenous, Continuous, David Stall, MD, Last Rate: 10 mL/hr at 08/26/19 0537, 1,000 mL at 08/27/19 0731  ???  sodium chloride (NS) 0.9 % infusion, 10 mL/hr, Intravenous, Continuous, Ejiofor A Lucky Cowboy., MD, Last Rate: 10 mL/hr at 09/04/19 1013, 10 mL/hr at 09/04/19 1013  ???  sulfamethoxazole-trimethoprim (BACTRIM) 400-80 mg tablet 80 mg of trimethoprim, 1 tablet, Oral, Q MWF, David Stall, MD,  80 mg of trimethoprim at 09/07/19 0836  ???  tacrolimus (PROGRAF) capsule 5 mg, 5 mg, Oral, BID, Alfredia Ferguson, MD, 5 mg at 09/08/19 0844  ???  valGANciclovir (VALCYTE) tablet 450 mg, 450 mg, Oral, Daily, David Stall, MD, 450 mg at 09/07/19 1638        Studies: Personally reviewed and interpreted.    Labs/Studies:  Lab Results   Component Value Date    WBC 2.2 (L) 09/08/2019    HGB 7.9 (L) 09/08/2019    HCT 24.4 (L) 09/08/2019    PLT 40 (L) 09/08/2019       Lab Results   Component Value Date    NA 134 (L) 09/08/2019    K 4.9 09/08/2019    CL 106 09/08/2019    CO2  09/08/2019      Comment:      Specimen icteric.    BUN 20 09/08/2019    CREATININE 0.97 09/08/2019    GLU 90 09/08/2019    CALCIUM 8.0 (L) 09/08/2019    MG 1.1 (L) 09/08/2019    PHOS 3.2 09/08/2019       Lab Results   Component Value Date    BILITOT 31.1 (H) 09/08/2019    BILIDIR 30.50 (H) 09/04/2019    PROT  09/08/2019      Comment:      Specimen icteric.    ALBUMIN 2.5 (L) 09/08/2019    ALT  09/08/2019      Comment:      Specimen icteric.    AST 103 (H) 09/08/2019    ALKPHOS 666 (H) 09/08/2019    GGT 382 (H) 09/08/2019       Lab Results   Component Value Date    PT 18.9 (H) 09/08/2019    INR 1.63 09/08/2019    APTT 25.0 (L) 08/14/2019       Allen Kell, MD  Pediatric Resident, PGY-1  Pager: [phone]

## 2019-09-08 NOTE — Unmapped (Signed)
Endocrinology Consult - Follow Up Note    Requesting Attending Physician :  Arnold Long Mir, MD  Service Requesting Consult : Ped Gastroenterology [facility])  Primary Care Provider: [name], MD  Outpatient Endocrinologist: N/A    Assessment/Recommendations:  [name] Gross is a 18 y.o. male with steroid induced hyperglycemia who is admitted for Liver transplant rejection (CMS-HCC). Endocrinology have been consulted to evaluate patient for hyperglycemia.    Steroid induced hyperglycemia: A1c 4.9% but with postprandial hyperglycemia. Still on PO prednisone 40mg  daily.    FBG at goal. Post-prandial high after patient ate extra snacks with his lunch(candy and [place] tea can). He did not seem to have gotten any insulin with this snack. Will not change make changes given the hyperglycemia was due to missed insulin with this snack.     Plan  -Lantus 10 units at bedtime (give 6 units if NPO)  -Lispro 6 units with meals (Hold if NPO) - ordered as 5x a day to allow for late night meal coverage  -Lispro correction to 2:50>150 with meals and at bedtime    The patient was discussed with attending, Dr. Marcello Fennel    We will continue to follow patient as needed. Please call/page [phone] with questions or concerns.     Selena Lesser, MD  [facility] Endocrinology Fellow     I was immediately available via phone/pager or present on site.  I reviewed and discussed the case with the resident, but did not see the patient.  I agree with the assessment and plan as documented in the resident's note.     Maximino Greenland, MD, MPH  Attending - Endocrinology, Diabetes, and Metabolism    Time spent reviewing chart 5 minutes  Time spent with fellow 5 minutes        History of Present Illness:     Reason for Consult: hyperglycemia.     [name] Gross is a 18 y.o. male who is admitted for liver transplant rejection (CMS-HCC). Endocrinology have been consulted to evaluate patient for hyperglycemia.    Interval history: Continues to have prandial hyperglycemia mainly due to variable PO intake and occasionally refusing insulin regimen.     Current regimen: Lantus 10 units qhs, Lispro 6u with meals and snacks, lispro 2:50>150 with meals and snacks    Active Orders   Diet    Nutrition Therapy Regular/House     ROS:  As per HPI, otherwise 10 system ROS was negative.      All Medications:   Current Facility-Administered Medications   Medication Dose Route Frequency Provider Last Rate Last Dose   ??? benzoyl peroxide 5 % gel   Topical Nightly Alfredia Ferguson, MD       ??? calcium carbonate (OS-CAL) tablet 300 mg elem calcium  300 mg elem calcium Oral BID David Stall, MD   300 mg elem calcium at 09/07/19 2112   ??? cholecalciferol (vitamin D3) tablet 5,000 Units  5,000 Units Oral Daily David Stall, MD   5,000 Units at 09/07/19 [phone]   ??? clindamycin (CLEOCIN T) 1 % lotion 1 application  1 application Topical daily Alfredia Ferguson, MD   1 application at 09/03/19 1628   ??? dextrose 50 % in water (D50W) 50 % solution 12.5 g  12.5 g Intravenous Q10 Min PRN David Stall, MD       ??? heparin preservative-free injection 10 units/mL syringe (HEPARIN LOCK FLUSH)  20 Units Intravenous Daily PRN David Stall, MD  20 Units at 09/08/19 0530   ??? influenza vaccine quad (FLUARIX, FLULAVAL, FLUZONE) (6 MOS & UP) 2020-21  0.5 mL Intramuscular During hospitalization David Stall, MD       ??? insulin glargine (LANTUS) injection 10 Units  10 Units Subcutaneous Nightly Lovie Chol, MD   10 Units at 09/07/19 2128   ??? insulin lispro (HumaLOG) injection 0-10 Units  0-10 Units Subcutaneous 5XD insulin Lovie Chol, MD   4 Units at 09/07/19 1831   ??? insulin lispro (HumaLOG) injection 6 Units  6 Units Subcutaneous 5XD insulin Lovie Chol, MD   3 Units at 09/07/19 2127   ??? ketoconazole (NIZORAL) 2 % shampoo   Topical daily Alfredia Ferguson, MD       ??? magnesium oxide (MAG-OX) capsule 280 mg  280 mg Oral BID Alfredia Ferguson, MD   280 mg at 09/07/19 2109   ??? melatonin tablet 3 mg  3 mg Oral QPM Jonna Clark, MD   3 mg at 09/07/19 2111   ??? multivitamin, with zinc (AQUADEKS) chewable tablet  2 tablet Oral Daily David Stall, MD   2 tablet at 09/07/19 2841   ??? mupirocin (BACTROBAN) 2 % ointment   Topical TID David Stall, MD       ??? mycophenolate (MYFORTIC) EC tablet 540 mg  540 mg Oral BID David Stall, MD   540 mg at 09/07/19 2110   ??? nystatin (MYCOSTATIN) oral suspension  1,000,000 Units Oral TID David Stall, MD   1,000,000 Units at 09/07/19 2113   ??? ondansetron [facility]) injection 4 mg  4 mg Intravenous Once PRN Purvis Kilts, MD       ??? ondansetron (ZOFRAN-ODT) disintegrating tablet 4 mg  4 mg Oral Q8H PRN David Stall, MD       ??? pantoprazole (PROTONIX) EC tablet 40 mg  40 mg Oral Daily David Stall, MD   40 mg at 09/07/19 2112   ??? polyethylene glycol (MIRALAX) packet 17 g  17 g Oral BID PRN David Stall, MD       ??? potassium & sodium phosphates 250mg  (PHOS-NAK/NEUTRA PHOS) packet 2 packet  2 packet Oral TID Lewie Loron, MD   2 packet at 09/07/19 2115   ??? predniSONE (DELTASONE) tablet 40 mg  40 mg Oral Daily David Stall, MD   40 mg at 09/07/19 3244   ??? promethazine (PHENERGAN) 1 mg/mL pediatric dilution injection 6.25 mg  6.25 mg Intravenous Q15 Min PRN Purvis Kilts, MD       ??? rifAXIMin [facility]) tablet 550 mg  550 mg Oral BID David Stall, MD   550 mg at 09/07/19 2112   ??? sodium chloride (NS) 0.9 % infusion   Intravenous Continuous David Stall, MD 10 mL/hr at 08/26/19 0537 1,000 mL at 08/27/19 0731   ??? sodium chloride (NS) 0.9 % infusion  10 mL/hr Intravenous Continuous Ejiofor A Lucky Cowboy., MD 10 mL/hr at 09/04/19 1013 10 mL/hr at 09/04/19 1013   ??? sulfamethoxazole-trimethoprim (BACTRIM) 400-80 mg tablet 80 mg of trimethoprim  1 tablet Oral Q MWF David Stall, MD   80 mg of trimethoprim at 09/07/19 0836   ??? tacrolimus (PROGRAF) capsule 5 mg  5 mg Oral BID Alfredia Ferguson, MD   5 mg at 09/07/19 2108   ??? valGANciclovir (VALCYTE) tablet 450 mg  450 mg Oral Daily David Stall, MD   450 mg at  09/07/19 1638         Objective: :  BP 116/54  - Pulse 91  - Temp 36.8 ??C (Oral)  - Resp 20  - Ht 174.6 cm (5' 8.74)  - Wt 56.1 kg (123 lb 10.9 oz)  - SpO2 99%  - BMI 18.40 kg/m??     Physical Exam:  GEN: alert, oriented, not in acute distress  Resp: no increased WOB  ABD: no distension  PSYCH: flat affect, disengaged  Skin: scattered papules noted    Test Results    Lab Results   Component Value Date    POCGLU 108 09/07/2019    POCGLU 236 (H) 09/07/2019    POCGLU 316 (H) 09/07/2019    POCGLU 117 09/07/2019    POCGLU 84 09/06/2019    POCGLU 145 09/06/2019    POCGLU 188 (H) 09/06/2019    POCGLU 78 09/06/2019    POCGLU 100 09/05/2019    POCGLU 227 (H) 09/05/2019     Lab Results   Component Value Date    A1C 5.0 08/17/2019    A1C 4.9 10/13/2018    A1C 7.6 (H) 07/16/2018     Lab Results   Component Value Date    CREATININE 0.86 09/05/2019    NA 136 09/05/2019    K 4.3 09/05/2019    CL 105 09/05/2019    CO2  09/05/2019      Comment:      Specimen icteric.    ANIONGAP  09/05/2019      Comment:      Unable to calculate    CALCIUM 8.2 (L) 09/05/2019    ALBUMIN 2.5 (L) 09/05/2019     Lab Results   Component Value Date    WBC 2.2 (L) 09/08/2019    HGB 7.9 (L) 09/08/2019    PLT 40 (L) 09/08/2019     Lab Results   Component Value Date    AST 114 (H) 09/05/2019    ALT  09/05/2019      Comment:      Specimen icteric.    ALKPHOS 781 (H) 09/05/2019     Lab Results   Component Value Date    TSH 0.797 02/11/2018    FREET4 1.49 02/11/2018     Lab Results   Component Value Date    CHOL 127 08/12/2017    TRIG 49 08/12/2017    HDL 42 08/12/2017    LDL 75 08/12/2017

## 2019-09-08 NOTE — Unmapped (Signed)
[facility]  [address]  [place], [place] [address]  Phone: [phone]  Fax: [phone]  Crisis Line: [phone]  [facility]: [place], [place], [place], [place], [place], [place], [place], [place]

## 2019-09-08 NOTE — Unmapped (Signed)
[facility]  Follow-Up Psychiatry Consult Note     Service Date: September 08, 2019  LOS:  LOS: 35 days      Assessment:   [name] Gross is a 18 y.o. male with pertinent past medical and psychiatric diagnoses of primary sclerosing cholangitis s/p liver transplant in 2017 with multiple threats of rejection, and depression, anxiety, and suicide attempt (x2) admitted 08/04/2019  3:39 AM for black stools, abdominal pain, and icteric sclera most likely due to acute liver transplant rejection/failure.  Patient was seen in consultation by Psychiatry at the request of Ilene Qua, MD with [facility]) for evaluation of Depression. Today he is being seen in follow-up to discuss initiation of antidepressant.     The patient's current presentation of depressed mood, poor sleep, poor appetite, feelings of isolation, and nightmares is most consistent with a known diagnosis of majory depressive disorder and likely exacerbated by current primary medical illness. Patient has a history of intentional overdose on fluoxetine in the past, then more recently on tamiflu, prednisone, ibuprofen, and vitamin D. He was hospitalized on the [facility] for the latter attempt, and discharged 03/03/18 on Lexapro 20 mg, melatonin 9 mg for depression and insomnia. He reports mental health care has been unhelpful in the past and he is not interested in medication for depression or therapy- he has seen Psychology previously as inpatient. He has not engaged with mental health resources or taken medication for depression or insomnia in 8-10 months.     Today, patient expresses interest in starting psychtropic medications. Given prior trials of prozac, lexapro, and remeron (though unclear if these were complete trials), patient expresses interest in trying something new. Discussed initiation of effexor, including risks and benefits. Patient counseled on risks of stopping effexor without taper, and he is in agreement to take as prescribed. He will benefit from follow-up with [facility] psychiatry, and is agreeable to establishing care with mental health care in his area as well.    Please see below for detailed recommendations.    Diagnoses:   Active Hospital problems:  Principal Problem:    Liver transplant rejection (CMS-HCC)  Active Problems:    Recurrent major depressive disorder, in partial remission (CMS-HCC)    Malnutrition of mild degree (CMS-HCC)    History of liver transplant (CMS-HCC)    Transaminitis    Normocytic anemia    Hypoalbuminemia    Steroid-induced hyperglycemia    Pityrosporum folliculitis    Abdominal pain    Homeless    Thrombocytopenia (CMS-HCC)    Hypomagnesemia    Hypophosphatasia    Other ascites    MSSA bacteremia    Hyperbilirubinemia    Impetigo lesions of Right Ear and Posterior Neck    Steroid-induced acne    Liver dysfunction secondary to transplant rejection     Irritant contact dermatitis of the hands    Sleep difficulties    Strep Mitis Central line-associated bloodstream infection     Problems edited/added by me:  No problems updated.    Safety Risk Assessment:  A suicide and violence risk assessment was performed as part of this evaluation. Risk factors for self-harm/suicide: previous suicide attempt(s), lack of social support, barriers to accessing mental health treatment, current diagnosis of depression, poor adherence to treatment , male age 73-35, chronic severe medical condition and chronic medical illness, intermittent periods of hopelessness.  Protective factors against self-harm/suicide:  lack of active SI, motivation for treatment, currently receiving mental health treatment and current  treatment compliance.  Risk factors for harm to others: lower insight.  Protective factors against harm to others: no active symptoms of psychosis, no active symptoms of mania and no previous acts of violence in current setting.  While future psychiatric events cannot be accurately predicted, the patient is not currently at elevated acute risk, and is at elevated chronic risk of harm to self and is not currently at elevated acute risk, and is not at elevated chronic risk of harm to others.     Recommendations:   ## Safety:   -- No acute safey concerns.  Please see safety assessment for further discussion.  -- If pt attempts to leave against medical advice and it is felt to be unsafe for them to leave, please call a Behavioral Response and page Psychiatry at [phone].    ## Medications:   -- START effexor ER 37.5 mg PO daily, patient will discharge on this dose. Plan for slow titration given underlying hepatic illness  -- Melatonin 3 mg po q1800 for sleep and circadian rhythm regulation.    ## Therapeutic Support:   -- Meeting with psychology while inpatient  -- Recommend pastoral care    ## Medical Decision Making Capacity:   -- A formal capacity assessement was not performed as a part of this evaluation.  If specific capacity questions arise, please contact our team as below.     ## Further Work-up:   -- No recommendations at this time.    ## Disposition:   -- There are no psychiatric contraindications to discharging this patient when medically appropriate.  -- We will continue to encourage patient to consider outpatient mental health care   -- Patient has been placed on waitlist for [facility] psychiatry CL/transplant clinic. They will reach out to patient for scheduling an appointment. Should patient need appointment prior to this, transplant team can get in touch with our CL clinic attendings.    Please include this info in discharge summary:    --------------------------------------  The following link will help you find a local therapist.    Psychology Today: An up-to-date source of local providers, searchable by client issue, type of therapy, and insurances covered.  You can also find psychiatrists for medication management in addition to psychotherapy.    [email or web address].psychologytoday.com     The following two providers/offices take Medicaid and are close to the patient's home address. Provided him with this information today and encouraged him to call:    [facility]  [address]  [address]  [place], [place] [address]  Call Dr. Thedore Mins  [phone]    [facility]  [address]  [address]  [place], [place] [address]  Call Dr. Neila Gear  [phone]    --------------------------------------    ## Behavioral / Environmental:   -- No specific recommendations at this time.    Thank you for this consult request. Recommendations have been communicated to the primary team.  We will follow as needed at this time. Please page [phone] or [phone] (after hours)  for any questions or concerns.     I saw the patient in person or via face-to-face video conference.    Discussed with  Fellow, Imagene Sheller, MD.  Discussed with and seen by Attending, Breck Coons, MD, who agrees with the assessment and plan.    Sloan Leiter, MD       I saw and evaluated the patient, participating in the key portions of the service.  I reviewed the resident???s note.  I agree with the resident???s findings and plan.     Cloria Spring, MD      Interval History:     Updates to hospital course since last seen by psychiatry: Pt now expressing interest in starting antidepressant. Met with psychology on 10/28. Liver transplant evaluation 10/28.    Patient Interview: The patient was seen in person by the resident.     Patient remembers psychiatry team. Reports that he is willing to try medications. States he just wants to go home. He believes he would feel happy if he went home.  States he had previously tried Remeron and Lexapro. He had questions concerning side effects of the different types of medications. States that he would like to try Effexor at this time. Oriented x 4, Able to state DOWB. Feels safe in the hospital. Denies SI/HI/AVH.     Relevant Updates to past psychiatric, medical/surgical, family, or social history: N/A    Collateral:   - Reviewed medical records in Epic    ROS:   All systems reviewed as negative/unremarkable aside from the following pertinent positives and negatives: no acute pain or discomfort, fair appetite.     Current Medications:  Scheduled Meds:  ??? benzoyl peroxide   Topical Nightly   ??? calcium carbonate  300 mg elem calcium Oral BID   ??? cholecalciferol (vitamin D3)  5,000 Units Oral Daily   ??? clindamycin  1 application Topical daily   ??? flu vacc [id number] 6mos up(PF)  0.5 mL Intramuscular During hospitalization   ??? insulin glargine  10 Units Subcutaneous Nightly   ??? insulin lispro  0-10 Units Subcutaneous 5XD insulin   ??? insulin lispro  6 Units Subcutaneous 5XD insulin   ??? ketoconazole   Topical daily   ??? magnesium oxide  280 mg Oral BID   ??? melatonin  3 mg Oral QPM   ??? multivitamin, with zinc  2 tablet Oral Daily   ??? mupirocin   Topical TID   ??? mycophenolate  540 mg Oral BID   ??? nystatin  1,000,000 Units Oral TID   ??? pantoprazole  40 mg Oral Daily   ??? potassium & sodium phosphates 250mg   2 packet Oral TID   ??? predniSONE  40 mg Oral Daily   ??? rifAXIMin  550 mg Oral BID   ??? sulfamethoxazole-trimethoprim  1 tablet Oral Q MWF   ??? tacrolimus  5 mg Oral BID   ??? valGANciclovir  450 mg Oral Daily     Continuous Infusions:  ??? sodium chloride 500 mL (08/26/19 0537)   ??? sodium chloride 10 mL/hr (09/04/19 1013)     PRN Meds:.dextrose 50 % in water (D50W), heparin, porcine (PF), ondansetron, ondansetron, polyethylen glycol, promethazine      Objective:   Vital signs:   Temp:  [36.8 ??C-37.2 ??C] 36.8 ??C  Heart Rate:  [81-91] 91  Resp:  [20-21] 20  BP: (113-116)/(54-62) 116/54  MAP (mmHg):  [72-76] 72  SpO2:  [99 %-100 %] 99 %    Physical Exam:  Gen: No acute distress.  Pulm: Normal work of breathing.  Neuro/MSK: Reduced muscle bulk and tone, thin appearing, gait deferred.  Skin: not assessed.     Mental Status Exam:  Appearance:  appears stated age, well-developed and sitting in bed Attitude:   calm, cooperative and polite   Behavior/Psychomotor:  appropriate eye contact and no abnormal movements   Speech/Language:   normal rate, volume, tone, fluency and language  intact, well formed   Mood:  ???good???   Affect:  constricted, flat   Thought process:  logical, linear, clear, coherent, goal directed   Thought content:    Does not endorse thoughts of self-harm, SI, plans, or intent. Denies HI.  No grandiose, self-referential, persecutory, or paranoid delusions noted.   Perceptual disturbances:   Does not endorse auditory and visual hallucinations and behavior not concerning for response to internal stimuli   Attention:  able to fully attend without fluctuations in consciousness, able to complete DOWB   Concentration:  Able to fully concentrate and attend   Orientation:  grossly oriented.   Memory:  not formally tested, but grossly intact   Fund of knowledge:   not formally assessed   Insight:    Impaired   Judgment:   Impaired   Impulse Control:  Fair     Data Reviewed:  I reviewed labs from the last 24 hours.  I obtained history from the collateral sources noted above in the history.    Additional Psychometric Testing:  Not applicable.

## 2019-09-08 NOTE — Unmapped (Signed)
Added to psychosomatic [facility]    ===View-only below this line===  ----- Message -----  From: Michell Heinrich, MD  Sent: 09/08/2019  12:45 PM EDT  To: Patsy Lager, Leida Lauth, MA, *  Subject: RE: transplant patient followup                  Thanks!  ----- Message -----  From: Leida Lauth, MA  Sent: 09/08/2019  11:39 AM EDT  To: Michell Heinrich, MD, Patsy Lager, *  Subject: RE: transplant patient followup                  Can be added to wait list, will be several months before appointments available  ----- Message -----  From: Sloan Leiter, MD  Sent: 09/08/2019  11:32 AM EDT  To: Michell Heinrich, MD, Patsy Lager, *  Subject: transplant patient followup                      Hi Tresa Endo and Shanda Bumps,    This is a patient that I am currently seeing through the psychiatry CL service. He will benefit from being seen by our transplant/CL clinic as a new patient for medication  management. I understand that we have a long wait at this time, but would it be possible to get him on a waitlist or schedule him for the earliest available appointment? He's got a cellphone and can do virtual visits. He will be discharging from the hospital either tomorrow or Monday.    Leanora Cover

## 2019-09-09 LAB — TACROLIMUS, TROUGH: Lab: 11.4

## 2019-09-09 LAB — CREATININE
Creatinine:MCnc:Pt:Ser/Plas:Qn:: 0.88
EGFR CKD-EPI NON-AA MALE: >90 mL/min/{1.73_m2} (ref >=60)

## 2019-09-09 MED ORDER — BLOOD-GLUCOSE METER KIT WRAPPER
0 refills | 0 days | Status: CP
Start: 2019-09-09 — End: 2020-09-08

## 2019-09-09 MED ORDER — MULTIVIT-MINS NO.51-FA 100 MCG-VIT K 350 MCG-CO Q10 5 MG CHEW TABLET
ORAL_TABLET | Freq: Every day | ORAL | 0 refills | 30.00000 days | Status: CP
Start: 2019-09-09 — End: 2019-09-09
  Filled 2019-09-09: qty 60, 30d supply, fill #0

## 2019-09-09 MED ORDER — MAGNESIUM 84.5 MG (AS MAGNESIUM OXIDE 140 MG) CAPSULE: 280 mg | capsule | Freq: Two times a day (BID) | 0 refills | 30 days | Status: AC

## 2019-09-09 MED ORDER — TACROLIMUS 1 MG CAPSULE
ORAL_CAPSULE | Freq: Two times a day (BID) | ORAL | 11 refills | 30.00000 days | Status: CP
Start: 2019-09-09 — End: 2020-09-08
  Filled 2019-09-09: qty 240, 30d supply, fill #0

## 2019-09-09 MED ORDER — NYSTATIN 100,000 UNIT/ML ORAL SUSPENSION
Freq: Three times a day (TID) | ORAL | 0 refills | 30 days | Status: CP
Start: 2019-09-09 — End: 2019-10-09
  Filled 2019-09-09: qty 900, 30d supply, fill #0

## 2019-09-09 MED ORDER — MAGNESIUM OXIDE 400 MG (241.3 MG MAGNESIUM) TABLET
ORAL_TABLET | Freq: Every day | ORAL | 3 refills | 30 days | Status: CP
Start: 2019-09-09 — End: 2020-01-07

## 2019-09-09 MED ORDER — MAGNESIUM 84.5 MG (AS MAGNESIUM OXIDE 140 MG) CAPSULE
ORAL_CAPSULE | Freq: Two times a day (BID) | ORAL | 0 refills | 30.00000 days | Status: CP
Start: 2019-09-09 — End: 2019-09-09

## 2019-09-09 MED ORDER — GLUCOSE 4 GRAM CHEWABLE TABLET
ORAL_TABLET | Freq: Four times a day (QID) | ORAL | 12 refills | 13 days | Status: CP | PRN
Start: 2019-09-09 — End: 2020-09-08
  Filled 2019-09-09: qty 50, 12d supply, fill #0
  Filled 2019-09-09: qty 1, 1d supply, fill #0

## 2019-09-09 MED ORDER — MULTIVIT-MINS NO.51-FA 100 MCG-VIT K 350 MCG-CO Q10 5 MG CHEW TABLET: 2 | tablet | Freq: Every day | 0 refills | 30 days | Status: AC

## 2019-09-09 MED ORDER — MELATONIN 3 MG TABLET
ORAL_TABLET | Freq: Every evening | ORAL | 0 refills | 60 days | Status: CP
Start: 2019-09-09 — End: 2019-11-08
  Filled 2019-09-09: qty 60, 60d supply, fill #0

## 2019-09-09 MED ORDER — CALCIUM CARBONATE 600 MG CALCIUM (1,500 MG) TABLET
ORAL_TABLET | Freq: Two times a day (BID) | ORAL | 0 refills | 60.00000 days | Status: CP
Start: 2019-09-09 — End: 2019-11-08
  Filled 2019-09-09: qty 60, 60d supply, fill #0

## 2019-09-09 MED FILL — PREDNISONE 20 MG TABLET: 30 days supply | Qty: 60 | Fill #0 | Status: AC

## 2019-09-09 MED FILL — ACCU-CHEK GUIDE TEST STRIPS: 25 days supply | Qty: 100 | Fill #0 | Status: AC

## 2019-09-09 MED FILL — SULFAMETHOXAZOLE 400 MG-TRIMETHOPRIM 80 MG TABLET: 28 days supply | Qty: 12 | Fill #0 | Status: AC

## 2019-09-09 MED FILL — GLUCOSE 4 GRAM CHEWABLE TABLET: 12 days supply | Qty: 50 | Fill #0 | Status: AC

## 2019-09-09 MED FILL — PHOS-NAK 280 MG-160 MG-250 MG ORAL POWDER PACKET: 30 days supply | Qty: 180 | Fill #0 | Status: AC

## 2019-09-09 MED FILL — KETOCONAZOLE 2 % SHAMPOO: 20 days supply | Qty: 120 | Fill #0 | Status: AC

## 2019-09-09 MED FILL — XIFAXAN 550 MG TABLET: 30 days supply | Qty: 60 | Fill #0 | Status: AC

## 2019-09-09 MED FILL — MYCOPHENOLATE SODIUM 180 MG TABLET,DELAYED RELEASE: 30 days supply | Qty: 180 | Fill #0 | Status: AC

## 2019-09-09 MED FILL — VENLAFAXINE ER 37.5 MG CAPSULE,EXTENDED RELEASE 24 HR: 30 days supply | Qty: 30 | Fill #0 | Status: AC

## 2019-09-09 MED FILL — ACCU-CHEK SOFTCLIX LANCETS: 33 days supply | Qty: 200 | Fill #0 | Status: AC

## 2019-09-09 MED FILL — VALGANCICLOVIR 450 MG TABLET: 30 days supply | Qty: 30 | Fill #0 | Status: AC

## 2019-09-09 MED FILL — MUPIROCIN 2 % TOPICAL OINTMENT: 7 days supply | Qty: 22 | Fill #0 | Status: AC

## 2019-09-09 MED FILL — CLINDAMYCIN 1.2 % (1 % BASE)-BENZOYL PEROXIDE 5 % TOPICAL GEL: 15 days supply | Qty: 45 | Fill #0 | Status: AC

## 2019-09-09 MED FILL — POLYETHYLENE GLYCOL 3350 17 GRAM ORAL POWDER PACKET: 30 days supply | Qty: 60 | Fill #0 | Status: AC

## 2019-09-09 MED FILL — GVOKE PFS 2-PACK 0.5 MG/0.1 ML SUBCUTANEOUS SYRINGE: 2 days supply | Qty: 0 | Fill #0 | Status: AC

## 2019-09-09 MED FILL — TACROLIMUS 1 MG CAPSULE: 30 days supply | Qty: 240 | Fill #0 | Status: AC

## 2019-09-09 MED FILL — CALCIUM CARBONATE 600 MG CALCIUM (1,500 MG) TABLET: 60 days supply | Qty: 60 | Fill #0 | Status: AC

## 2019-09-09 MED FILL — ONDANSETRON 4 MG DISINTEGRATING TABLET: 4 days supply | Qty: 10 | Fill #0 | Status: AC

## 2019-09-09 MED FILL — HUMALOG U-100 INSULIN 100 UNIT/ML SUBCUTANEOUS SOLUTION: 37 days supply | Qty: 30 | Fill #0 | Status: AC

## 2019-09-09 MED FILL — ACCU-CHEK GUIDE TEST STRIPS: 25 days supply | Qty: 100 | Fill #0

## 2019-09-09 MED FILL — PANTOPRAZOLE 40 MG TABLET,DELAYED RELEASE: 30 days supply | Qty: 30 | Fill #0 | Status: AC

## 2019-09-09 MED FILL — LANTUS U-100 INSULIN 100 UNIT/ML SUBCUTANEOUS SOLUTION: 28 days supply | Qty: 10 | Fill #0 | Status: AC

## 2019-09-09 MED FILL — NYSTATIN 100,000 UNIT/ML ORAL SUSPENSION: 30 days supply | Qty: 900 | Fill #0 | Status: AC

## 2019-09-09 MED FILL — HUMALOG U-100 INSULIN 100 UNIT/ML SUBCUTANEOUS SOLUTION: SUBCUTANEOUS | 37 days supply | Qty: 30 | Fill #0

## 2019-09-09 MED FILL — LANTUS U-100 INSULIN 100 UNIT/ML SUBCUTANEOUS SOLUTION: SUBCUTANEOUS | 28 days supply | Qty: 10 | Fill #0

## 2019-09-09 MED FILL — CHOLECALCIFEROL (VITAMIN D3) 125 MCG (5,000 UNIT) TABLET: 100 days supply | Qty: 100 | Fill #0 | Status: AC

## 2019-09-09 MED FILL — ACCU-CHEK GUIDE GLUCOSE METER: 1 days supply | Qty: 1 | Fill #0 | Status: AC

## 2019-09-09 MED FILL — BD INSULIN SYRINGE HALF UNIT ULTRA-FINE 0.3 ML 31 GAUGE X 5/16" (8 MM): 32 days supply | Qty: 200 | Fill #0 | Status: AC

## 2019-09-09 MED FILL — AQUADEKS 100 MCG-350 MCG-5 MG CHEWABLE TABLET: 30 days supply | Qty: 60 | Fill #0 | Status: AC

## 2019-09-09 MED FILL — MELATONIN 3 MG TABLET: 60 days supply | Qty: 60 | Fill #0 | Status: AC

## 2019-09-09 NOTE — Unmapped (Signed)
Endocrinology Consult - Follow Up Note    Requesting Attending Physician :  Arnold Long Mir, MD  Service Requesting Consult : Ped Gastroenterology [facility])  Primary Care Provider: [name], MD  Outpatient Endocrinologist: N/A    Assessment/Recommendations:  [name] Gross is a 18 y.o. male with steroid induced hyperglycemia who is admitted for Liver transplant rejection (CMS-HCC). Endocrinology have been consulted to evaluate patient for hyperglycemia.    Steroid induced hyperglycemia: A1c 4.9% but with postprandial hyperglycemia. Still on PO prednisone 40mg  daily.    Plan  -Lantus 10 units at bedtime (give 6 units if NPO)  -Lispro increase to 8units with breakfast, continue 6units with lunch and 6units with dinner and late night snack.  -Lispro correction to 2:50>150 with meals and at bedtime    On discharge:  Lantus 10units at bedtime  Lispro 6units TIDAC   Can continue to use of lispro correction of 2:50>150 at home with meals and bedtime  He already has an appointment in 3 weeks to see [place] in endocrine clinic    The patient was discussed with attending, Dr. Marcello Fennel    We will continue to follow patient as needed. Please call/page [phone] with questions or concerns.     Selena Lesser, MD  [facility] Endocrinology Fellow     I was immediately available via phone/pager or present on site.  I reviewed and discussed the case with the resident, but did not see the patient.  I agree with the assessment and plan as documented in the resident's note.     Maximino Greenland, MD, MPH  Attending - Endocrinology, Diabetes, and Metabolism    Time spent reviewing chart 5 minutes  Time spent with fellow 5 minutes        History of Present Illness:     Reason for Consult: hyperglycemia.     [name] Gross is a 18 y.o. male who is admitted for liver transplant rejection (CMS-HCC). Endocrinology have been consulted to evaluate patient for hyperglycemia.    Interval history:     He is happy that he is going to go home today. He reports he is comfortable with taking insulin injections as recommended. He will be living with his grandmother who is also a patient with diabetes taking insulin.     Current regimen: Lantus 10 units qhs, Lispro 6u with meals and snacks, lispro 2:50>150 with meals and snacks    Active Orders   Diet    Nutrition Therapy Regular/House     ROS:  As per HPI, otherwise 10 system ROS was negative.      All Medications:   Current Facility-Administered Medications   Medication Dose Route Frequency Provider Last Rate Last Dose   ??? benzoyl peroxide 5 % gel   Topical Nightly Alfredia Ferguson, MD       ??? calcium carbonate (OS-CAL) tablet 300 mg elem calcium  300 mg elem calcium Oral BID David Stall, MD   300 mg elem calcium at 09/08/19 2106   ??? cholecalciferol (vitamin D3) tablet 5,000 Units  5,000 Units Oral Daily David Stall, MD   5,000 Units at 09/08/19 0865   ??? clindamycin (CLEOCIN T) 1 % lotion 1 application  1 application Topical daily Alfredia Ferguson, MD   1 application at 09/03/19 1628   ??? dextrose 50 % in water (D50W) 50 % solution 12.5 g  12.5 g Intravenous Q10 Min PRN David Stall, MD       ???  glucose chewable tablet 4 g  4 g Oral 4x Daily PRN Randall Hiss, MD       ??? heparin preservative-free injection 10 units/mL syringe (HEPARIN LOCK FLUSH)  20 Units Intravenous Daily PRN David Stall, MD   20 Units at 09/08/19 0530   ??? influenza vaccine quad (FLUARIX, FLULAVAL, FLUZONE) (6 MOS & UP) 2020-21  0.5 mL Intramuscular During hospitalization David Stall, MD       ??? insulin glargine (LANTUS) injection 10 Units  10 Units Subcutaneous Nightly Lovie Chol, MD   10 Units at 09/08/19 2058   ??? insulin lispro (HumaLOG) injection 0-10 Units  0-10 Units Subcutaneous 5XD insulin Lovie Chol, MD   0 Units at 09/08/19 2056   ??? insulin lispro (HumaLOG) injection 3-6 Units  3-6 Units Subcutaneous 5XD insulin Randall Hiss, MD   3 Units at 09/08/19 2057   ??? ketoconazole (NIZORAL) 2 % shampoo   Topical daily Alfredia Ferguson, MD       ??? magnesium oxide (MAG-OX) capsule 280 mg  280 mg Oral BID Alfredia Ferguson, MD   280 mg at 09/08/19 2059   ??? melatonin tablet 3 mg  3 mg Oral QPM Jonna Clark, MD   3 mg at 09/08/19 2059   ??? multivitamin, with zinc (AQUADEKS) chewable tablet  2 tablet Oral Daily David Stall, MD   2 tablet at 09/08/19 1610   ??? mupirocin (BACTROBAN) 2 % ointment   Topical TID David Stall, MD       ??? mycophenolate (MYFORTIC) EC tablet 540 mg  540 mg Oral BID David Stall, MD   540 mg at 09/08/19 2059   ??? nystatin (MYCOSTATIN) oral suspension  1,000,000 Units Oral TID David Stall, MD   1,000,000 Units at 09/08/19 2100   ??? ondansetron (ZOFRAN-ODT) disintegrating tablet 4 mg  4 mg Oral Q12H PRN Randall Hiss, MD       ??? pantoprazole (PROTONIX) EC tablet 40 mg  40 mg Oral Daily David Stall, MD   40 mg at 09/08/19 2106   ??? polyethylene glycol (MIRALAX) packet 17 g  17 g Oral BID PRN David Stall, MD       ??? potassium & sodium phosphates 250mg  (PHOS-NAK/NEUTRA PHOS) packet 2 packet  2 packet Oral TID Lewie Loron, MD   2 packet at 09/08/19 2059   ??? predniSONE (DELTASONE) tablet 40 mg  40 mg Oral Daily David Stall, MD   40 mg at 09/08/19 0843   ??? rifAXIMin (XIFAXAN) tablet 550 mg  550 mg Oral BID David Stall, MD   550 mg at 09/08/19 2059   ??? sodium chloride (NS) 0.9 % infusion   Intravenous Continuous David Stall, MD 10 mL/hr at 08/26/19 0537 1,000 mL at 08/27/19 0731   ??? sodium chloride (NS) 0.9 % infusion  10 mL/hr Intravenous Continuous Ejiofor A Lucky Cowboy., MD 10 mL/hr at 09/04/19 1013 10 mL/hr at 09/04/19 1013   ??? sulfamethoxazole-trimethoprim (BACTRIM) 400-80 mg tablet 80 mg of trimethoprim  1 tablet Oral Q MWF David Stall, MD   80 mg of trimethoprim at 09/07/19 0836   ??? tacrolimus (PROGRAF) capsule 5 mg  5 mg Oral BID Randall Hiss, MD   5 mg at 09/08/19 2059   ??? valGANciclovir (VALCYTE) tablet 450 mg  450 mg Oral Daily David Stall, MD   450 mg at 09/08/19 1812   ???  venlafaxine (EFFEXOR-XR) 24 hr capsule 37.5 mg  37.5 mg Oral Daily Juline Patch, MD             Objective: :  BP 108/58  - Pulse 85  - Temp 37.2 ??C (Axillary)  - Resp 18  - Ht 174.6 cm (5' 8.74)  - Wt 56.1 kg (123 lb 10.9 oz)  - SpO2 100%  - BMI 18.40 kg/m??     Physical Exam:  GEN: alert, oriented, not in acute distress  Resp: no increased WOB  ABD: no distension  Skin: scattered papules noted    Test Results    Lab Results   Component Value Date    POCGLU 130 09/08/2019    POCGLU 295 (H) 09/08/2019    POCGLU 114 09/08/2019    POCGLU 108 09/07/2019    POCGLU 236 (H) 09/07/2019    POCGLU 316 (H) 09/07/2019    POCGLU 117 09/07/2019    POCGLU 84 09/06/2019    POCGLU 145 09/06/2019    POCGLU 188 (H) 09/06/2019     Lab Results   Component Value Date    A1C 5.0 08/17/2019    A1C 4.9 10/13/2018    A1C 7.6 (H) 07/16/2018     Lab Results   Component Value Date    CREATININE 0.97 09/08/2019    NA 134 (L) 09/08/2019    K 4.9 09/08/2019    CL 106 09/08/2019    CO2  09/08/2019      Comment:      Specimen icteric.    ANIONGAP  09/08/2019      Comment:      Unable to Calculate.    CALCIUM 8.0 (L) 09/08/2019    ALBUMIN 2.5 (L) 09/08/2019     Lab Results   Component Value Date    WBC 2.2 (L) 09/08/2019    HGB 7.9 (L) 09/08/2019    PLT 40 (L) 09/08/2019     Lab Results   Component Value Date    AST 103 (H) 09/08/2019    ALT  09/08/2019      Comment:      Specimen icteric.    ALKPHOS 666 (H) 09/08/2019     Lab Results   Component Value Date    TSH 0.797 02/11/2018    FREET4 1.49 02/11/2018     Lab Results   Component Value Date    CHOL 127 08/12/2017    TRIG 49 08/12/2017    HDL 42 08/12/2017    LDL 75 08/12/2017

## 2019-09-09 NOTE — Unmapped (Signed)
Pediatric Tacrolimus Therapeutic Monitoring Pharmacy Note  ??  [name] Grossis a 18 y.o.??male??restarting??tacrolimus.   ??  Indication:??Liver transplant??  ??  Date of Transplant: 09/22/2016??  ??  Current Dosing Information: Tacrolimus??5mg  PO BID  ??  Dosing Weight: 61.8 kg  ??  Goals:  Therapeutic Drug Levels  Tacrolimus trough goal: 8-10 ng/mL per primary team resident??and fellow  ??  Additional Clinical Monitoring/Outcomes  ?? Monitor renal function (SCr and urine output) and liver function (LFTs)  ?? Monitor for signs/symptoms of adverse events (e.g., hyperglycemia, hyperkalemia, hypomagnesemia, hypertension, headache, tremor)  ??  Results:??Tacrolimus Trough??= 11.7??ng/mL however appropriately  ??  Longitudinal Dose Monitoring:  Date Dose (mg)  AM / PM AM Scr (mg/dL) Level  (ng/mL) Key Drug Interactions   09/09/19 5 mg /  0.88 11.4 None   09/08/19 5 mg / 5 mg 0.97 --- None   09/06/19 5 mg / 5 mg --- 7.7 None   09/05/19 4 mg / 5 mg 0.86 --- None   09/02/19 3.5 mg / 4 mg 0.78 7.5 None   09/01/19 3.5 mg / 3.5 mg 0.91 - None   08/31/19 3 mg /??3.5 mg 0.78 6.2 None   08/30/19 3 mg / 3 mg 0.66 - None   08/29/19 -- / 3 mg 0.63 - None   08/22/19 1.5 mg/ 3 mg 0.79 3.9 None   08/20/19 0.75 mg / 1.5 mg 0.98 2.7 None   08/19/19 0.75 mg / 0.75 mg 0.95 3.4 None   08/18/19 0.5 mg / 0.75 mg 1.13 2.6 None   08/17/19 0.5 mg / 0.5 mg ??1.12 3.4 None   08/16/19 0.25 mg / 0.5 mg 1.09 3.2 None   08/15/19 0.25 mg/0.5 mg 1.04 4.2 None   08/14/19 0.25 mg/0.5 mg 1.06 4.4 None   08/13/19 0.25 mg/0.25 mg 1.28 5.5 None   08/12/19 --- / 0.25 mg 1.25 5.9 None   08/11/19 0.5 mg /??--- ??1.26 ??10.1 ??None   08/10/19 1 mg / HOLD 1.4 10.5 None   08/09/19 1 mg / 1 mg 1.29 --- None   08/08/19 3 mg / HOLD 1.5 11.9 None   08/06/19 5 mg / 3 mg 0.8 9 None   ??  Pharmacokinetic Considerations and Significant Drug Interactions:  ?? Concurrent hepatotoxic medications: None identified  ?? Concurrent CYP3A4 substrates/inhibitors: None identified  ?? Concurrent nephrotoxic medications: Valganciclovir, SMP/TMX  ??  Assessment/Plan:   ?? Trough level subtherapeutic, Serum creatinine normalized after adequate PO fluid intake encouraged, U/O stable  ??  Recommendedation(s)  ?? decrease tacrolimus dosing to 4 mg PO BID  ?? Obtain next tacrolimus level 09/16/19 @ 0800 (09/12/19 if remains inpatient)??  ??  Follow-up  ?? Next level ordered: 09/16/19 at 0800.  ?? A pharmacist will continue to monitor and recommend levels as appropriate  ??  The above plan was discussed and approved by??medical team.  ??  Please page service pharmacist with questions/clarifications.  ??  Fransico Meadow, PharmD  Pediatric Clinical Pharmacist  [phone] (satellite)

## 2019-09-09 NOTE — Unmapped (Signed)
Pt's VSS and did not complain of pain during the shift. Pt voided well. Pt ate dinner around 2000 and received no sliding scale insulin and 3 units of humalog insulin for eating half of the meal. Scheduled lantus given as ordered. Pt tolerated medications well. PICC remains C/D/I and was previously heparin locked (10/29 AM). No family at bedside. Will continue to monitor and will continue with POC.     Problem: Adult Inpatient Plan of Care  Goal: Plan of Care Review  Outcome: Ongoing - Unchanged  Goal: Patient-Specific Goal (Individualization)  Outcome: Ongoing - Unchanged  Goal: Absence of Hospital-Acquired Illness or Injury  Outcome: Ongoing - Unchanged  Goal: Optimal Comfort and Wellbeing  Outcome: Ongoing - Unchanged  Goal: Readiness for Transition of Care  Outcome: Ongoing - Unchanged  Goal: Rounds/Family Conference  Outcome: Ongoing - Unchanged     Problem: Wound  Goal: Optimal Wound Healing  Outcome: Ongoing - Unchanged     Problem: Fall Injury Risk  Goal: Absence of Fall and Fall-Related Injury  Outcome: Ongoing - Unchanged     Problem: Self-Care Deficit  Goal: Improved Ability to Complete Activities of Daily Living  Outcome: Ongoing - Unchanged     Problem: Infection  Goal: Infection Symptom Resolution  Outcome: Ongoing - Unchanged

## 2019-09-09 NOTE — Unmapped (Signed)
Physician Discharge Summary    Admit date: 08/04/2019    Discharge date and time: Saturday, 09/10/2019    Discharge to: Home with [name] Gross    Discharge Service: Ped Gastroenterology [facility])    Discharge Attending Physician: Glorianne Manchester, MD    Discharge Diagnoses:   Principal Problem:    Liver transplant rejection (CMS-HCC)  Active Problems:    Recurrent major depressive disorder, in partial remission (CMS-HCC)    Malnutrition of mild degree (CMS-HCC)    History of liver transplant (CMS-HCC)    Transaminitis    Normocytic anemia    Hypoalbuminemia    Steroid-induced hyperglycemia    Pityrosporum folliculitis    Abdominal pain    Homeless    Thrombocytopenia (CMS-HCC)    Hypomagnesemia    Hypophosphatasia    Other ascites    Hyperbilirubinemia    Impetigo lesions of Right Ear and Posterior Neck    Steroid-induced acne    Liver dysfunction secondary to transplant rejection     Irritant contact dermatitis of the hands    Sleep difficulties  Resolved Problems:    Cholangitis of transplanted liver (CMS-HCC)    Melena    Dark stools  MSSA bacteremia  Strep Mitis Central line-associated bloodstream infection    Hospital Course:  [name] Gross is a 18 y.o. 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     # Hx Liver Transplant - Subacute/Acute Liver Failure:   Patient had not been taking [name] medications regularly since leaving home over a month prior to admission. On admission, he was started on solumedrol, mycophenolate, and tacrolimus. He was also given IV Vit K.  INR was 1.6 on admission. Liver Transplant US showed patent vasculature, liver heterogeneous in echotexture with periportal edema, which is a nonspecific finding that can be seen in hepatitis or rejection. Small volume ascites was also present. Biopsy was performed on 9/25 and showed plasma cell-rich hepatitis/severe acute cellular rejection with periportal, perivenular, and focal confluent hepatocyte necrosis.Tacrolimus was adjusted throughout hospital stay to remain within therapeutic range (8-10). Liver labs were not improving, so on 10/1 he was starting on Thymoglobulin w/ Methylprednisolone, he remained on this regimen from 10/1 to 10/12. Thymoglobulin was held starting 10/13 in light of [name] strep mitis bacteremia (see ID section). He was placed on Valganciclovir, Bactrim, and Nystatin for infection prophylaxis. EBV, HIV, CMV negative. MRCP(10/9) showed central hepatic bile ducts slightly more prominent than previous w/out obvious stenosis or stricture; heterogeneous enhancement of liver, porta hepatis lymph nodes, large volume ascites. He was treated with a 3 day course of vitamin K (IV) 10/12-10/14 given [name] elevated INR. He was then continued on daily IV vitamin K until 10/25. He was treated with Rifaximin for a moderately high ammonia level in the setting of [name] liver dysfunction, however he did not have encephalopathy. On 10/13 [name] steroid dose was tapered given [name] CLABSI infection, he was weaned to 60mg  PO prednisone (10/17), then 40mg  on 10/19. [name] tacrolimus and Myfortic were held as well, and restarted on 10/19. He was taken for a repeat liver biopsy with VIR on 10/21 and showed lymphoplasmacytic infiltrate was markedly decreased compared to biopsy 9/25. Patient had ascites 2/2 [name] liver dysfunction. Ascites still present but much improved s/p 25 g albumin and 40 mg lasix 10/26 and stable leading  up to discharge.    #Transplant planning:10/21 liver transplant committee determined patient should proceed with complete liver transplant evaluation. 10/28 liver transplant team met with patient and Gross and [name] Gross would like to proceed with preparations for liver transplant. Patient will require PFTs outpatient. [name] Reas, RN, is [name] transplant coordinator: [phone]. [name] Gross is in charge of appointment scheduling for transplant: [phone]. For urgent calls he can call [phone] and ask for the on-call liver transplant coordinator - they have 24/7 coverage.     #Dark stools:  Repeat fecal occult blood test was negative x 2. [name] dark stool resolved shortly after admission.     #Severe Malnutrition:  Patient had not had a stable source of food and before presenting to [facility], and he had not eaten in 3-4 days. He is not meeting established goals for growth. He meets criteria for severe malnutrition in the setting of poor intake, possibly compounded by decreased appetite in the setting of liver disease. Nutrition status may be worse than appears from BMI/age given ascites. Elevated alk phos likely due to liver disease vs metabolic bone disease. Pt received IV vitamin K supplementation for elevated PT/INR.  Pt is at increased risk of fat-soluble vitamin deficiencies. We started the patient on regular adult multivitamin. Also started Ca and Vit D supplementation given patient has increased Ca++, hypophosphatemia, and vitamin D needs with steroid therapy. He was treated with also treated with IV magnesium as needed for low magnesium and was started on oral magnesium supplements. He was also started on oral k-phos (10/16) given low phosphorous levels. He was treated with Aquadeks multivitamin daily.     #Thrombocytopenia   Platelet steadily downtrended during hospital stay, attributed to liver disease and to course of Unasyn for bacteremia as Unasyn can cause myelosuppression. He was given platelet transfusion 10/12 and 10/15 for platelets in the 50k. Platelet count was uptrending on last CBC (Drawn 2 days after completing unasyn course), plt 40 10/29 (from 16 10/26).    #Infectious Disease  On 10/13 [name] Gross was noted to have a sharp increase in [name] wbc count from normal to abnormal without any localizing sings of infection. [name] workup included blood cultures (peripheral and PICC line culture) both of which rsulted positive for strep mitis at 12 hours concerning for a true infection. He was started on empiric Ceftriaxone, Vancomycin, Micafungin, and Flagyl (10/13-10/16) given [name] ascites and immunosuppressed state (given thymoglobulin, steroid, tacrolimus, and Myfortic treatment). [name] cultures grew strep mitis and one peripheral culture with MSSA at which point [name] antibiotics were switched from Ceftriaxone, Vancomycin, and Flagyl to Unasyn monotherapy (10/16-10/27). [name] PICC line was removed on day of discharge, 10/31.     #Endocrine: Hyperglycemia  [name] Gross was normoglycemic on admission, and had worsening hyperglycemia during [name] hospital stay secondary to treatment with high dose steroids. Adult Endocrinology was consulted and he was managed on Lantus 10 units nighty, 3-6 units with meals (3 units if he ate 1/2 of [name] meal), and sliding scale with meals. [name] Gross and [name] Gross received diabetes education prior to discharge. [name] Gross was able to demonstrate proper administration of [name] insulin to staff prior to discharge, and Gross was trained to administer insulin as well.     #AKI  Creatinine bumped at beginning of hospitalization, multifactorial etiology. Nephrology was consulted and he was found to have ATN, which resolved with hydration (IV and oral). [name] creatinine slowly improved and was monitored closely during [name] hospital stay. On 10/3, 10/4, and 10/14 he received 5% albumin  infusion. Creatinine on day of discharge was improved, 0.88, but not back to baseline. Continued to encourage patient to drink PO fluids.      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   ??  Irritant contact dermatitis, hands treated with ammonium lactate 12% lotion twice daily to hands.   ??  Impetigo of the right superior auricular sulcus, crusted papule on the left neck and right ear with MSSA on culture treated with topical mupirocin until healed.     Oral Thrush: Nystatin oral suspension 1,000,000 units TID    #Major Depressive Disorder  Has history of attempted overdoses. Psych consulted, per patient preference, only likes to be seen by adult Psych team. [name] Melatonin dose was increased to 5mg  nightly. On 10/30 he was started Effexor 24 hr capsule 37.5 mg with plans to titrate it up outpatient (please see instructions on PCP f/u at bottom of this note).     #Homelessness: [name] Gross has experienced homelessness for ~5 months when he left [name] mother's house. He has severed all ties with [name] family and states [name] relationship with [name] mother cannot be reconciled. He also requested contact information of family members to be erased from [name] chart (this has been completed, but social worker did drop contact info into her note on 9/24) and he more recently has reconciled with some family members. ??In addition to food insecurity, he has not been able to secure medications. He is routinely exposed to alcohol and drugs (marijuana, cocaine, etc.) second-hand through [name] friendships though he denies current use himself. On urine drug screen, he tested positive for marijuana only. He designated a personal friend [name] Gross) and grandfather to be [name] HCPOAs (Friend is the Primary). He spoke with sister on 10/16 (has not been in regular contact). He will be discharged in the care of [name] paternal Gross.      [name] Gross and [name] Gross were given a medication schedule by our pharmacist and a pill bottle was prepared for help with a smooth transition to home and to try to help with medical compliance.        Condition at Discharge: stable     Physical Exam:  Vitals:    09/10/19 0752   BP: 105/61   Pulse: 76   Resp: 18   Temp: 37 ??C   SpO2: 100%   General:  Lying in bed. Thin appearing. Interactive and conversational. Has bandage in place over [name] right ear.   Head: Normocephalic, atraumatic.  Eyes: EOMI. Marked scleral icterus present.   Nose:  Clear, no discharge  Oropharynx: Thrush present in back of throat over uvula   Lungs: Clear breath sounds bilaterally, breathing comfortably on room air.   Heart: regular rate and rhythm. No murmurs appreciated on auscultation.  Abdomen:  Abdomen less protuberant. Ascites present but improved s/p albumin/lasix 10/26. No HSM.   Neuro: Deconditioned, moves all extremities with decreased strength. Persistent tremor present in [name] bilateral upper extremities.   Extremities: Non-pitting edema in the LE R>L, WWP  Skin: Skin lesions on left neck, healing well. Skin lesion present on right ear with scab, covered in gauze that is c/d/i. Acneform rash present on face, back, chest.   Discharge Medications:  Your Medication List      STOP taking these medications    clindamycin 1 % lotion  Commonly known as: CLEOCIN T     insulin syringe-needle U-100 1 mL 31 gauge x 5/16 (8 mm) Syrg  Replaced by: BD INSULIN SYRINGE HALF UNIT 0.3 mL 31 gauge x 5/16 Syrg     multivitamins, therapeutic with minerals 9 mg iron-400 mcg tablet     tretinoin 0.025 % cream  Commonly known as: RETIN-A        START taking these medications    ACCU-CHEK GUIDE GLUCOSE METER Misc  Generic drug: blood-glucose meter  Use as instructed     AQUADEKS 100-5 mcg-mg Chew  Generic drug: multivitamin, with zinc  Chew 2 tablets daily.     BD INSULIN SYRINGE HALF UNIT 0.3 mL 31 gauge x 5/16 Syrg  Generic drug: insulin syr/ndl U100 half mark  Use to inject insulins up to 6 times daily as directed.  Replaces: insulin syringe-needle U-100 1 mL 31 gauge x 5/16 (8 mm) Syrg     calcium carbonate 1,500 mg (600 mg elem calcium) tablet  Commonly known as: OS-CAL  Take 1/2 tablet (300 mg elem calcium) by mouth Two (2) times a day. (Over the counter med)     clindamycin-benzoyl peroxide 1.2 %(1 % base) -5 % gel  Apply to rash on chest     glucose 4 GM chewable tablet  Chew 1 tablet (4 g total) 4 (four) times a day as needed for low blood sugar (refer to hypoglycemia guideline).     GVOKE PFS 2-PACK SYRINGE 0.5 mg/0.1 mL Syrg  Generic drug: glucagon  Inject 0.1 mL under the skin once as needed for up to 1 dose.     ketoconazole 2 % shampoo  Commonly known as: NIZORAL  Apply topically daily as directed.     melatonin 3 mg Tab  Take 1 tablet (3 mg total) by mouth every evening.     mupirocin 2 % ointment  Commonly known as: BACTROBAN  Apply topically Three (3) times a day for 7 days.     nystatin 100,000 unit/mL suspension  Commonly known as: MYCOSTATIN  Take 10 mls (1,000,000 Units total) by mouth Three (3) times a day.     ondansetron 4 MG disintegrating tablet  Commonly known as: ZOFRAN-ODT  Take 1 tablet (4 mg total) by mouth every eight (8) hours as needed for up to 7 days.     pantoprazole 40 MG tablet  Commonly known as: PROTONIX  Take 1 tablet (40 mg total) by mouth daily.     PHOS-NAK 280-160-250 mg Pwpk  Generic drug: potassium & sodium phosphates 250mg   Take 2 packets by mouth Three (3) times a day.     polyethylene glycol 17 gram packet  Commonly known as: MIRALAX  Take 17 g (1 packet dissolved in 4 to 8 oz. of liquid) by mouth two (2) times a day as needed (constipation).     predniSONE 20 MG tablet  Commonly known as: DELTASONE  Take 2 tablets (40 mg total) by mouth daily.     sulfamethoxazole-trimethoprim 400-80 mg per tablet  Commonly known as: BACTRIM  Take 1 tablet (80 mg of trimethoprim total) by mouth Every Monday, Wednesday, and Friday.     valGANciclovir 450 mg tablet  Commonly known as: VALCYTE  Take 1 tablet (450 mg total) by mouth daily.     venlafaxine 37.5 MG 24 hr capsule  Commonly known as: EFFEXOR-XR  Take 1 capsule (37.5 mg total) by mouth daily.     XIFAXAN 550 mg Tab  Generic drug: rifAXIMin  Take 1 tablet (550 mg total) by mouth Two (2) times a day.        CHANGE how you take these medications    ACCU-CHEK GUIDE TEST STRIPS Strp  Generic drug: blood sugar diagnostic  Use to check blood sugar four times daily as directed.  What changed: additional instructions     ACCU-CHEK SOFTCLIX LANCETS Misc  Generic drug: lancets  Use to check blood glucose 6 times per day.  What changed: See the new instructions.     cholecalciferol (vitamin D3) 125 mcg (5,000 unit) tablet  Take 1 tablet (5,000 Units total) by mouth daily.  What changed:   ?? medication strength  ?? how much to take     HumaLOG U-100 Insulin 100 unit/mL injection  Generic drug: insulin lispro  Inject 0.06 mL (6 Units total) under the skin Five (5) times a day and Sliding scale 0 to 10 units under the skin five times a day as directed.  What changed:   ?? how much to take  ?? when to take this  ?? additional instructions     LANTUS U-100 INSULIN 100 unit/mL injection  Generic drug: insulin glargine  Inject 0.1 mL (10 Units total) under the skin nightly.  What changed: how much to take     magnesium oxide 400 mg (241.3 mg magnesium) tablet  Commonly known as: MAG-OX  Take 1 tablet (400 mg total) by mouth daily.  What changed:   ?? how much to take  ?? when to take this     tacrolimus 1 MG capsule  Commonly known as: PROGRAF  Take 4 capsules (4 mg) by mouth two (2) times a day.  What changed:   ?? medication strength  ?? how much to take        CONTINUE taking these medications    mycophenolate 180 MG EC tablet  Commonly known as: MYFORTIC  Take 3 tablets (540 mg total) by mouth Two (2) times a day.            Pending Test Results: None       Discharge Instructions:   Activity Instructions     Activity as tolerated          Diet Instructions     Discharge diet (specify)      Discharge Nutrition Therapy: Other    Regular Diet and Ensure Clear oral supplements 3 servings per day             Other Instructions     Discharge instructions      Please contact your primary care provider, your [facility] outpatient specialists, or seek additional medical care if you have:   - Any Fever with a temperature greater than 100.47F  - Any Respiratory distress, increased work of breathing, shortness of breath, difficulty breathing, chest pain, chest tightness or activity intolerance  - Any Abdominal distention, abdominal pain, dark or black stools, or yellowing of your skin/ eyes  - Any Changes in behavior, confusion, mental status changes, increased tiredness, increased sleepiness or  decreased energy   - Any Feelings of worsening anxiety or depression  - Any Medication Intolerance  - Any Skin lesions or skin rashes that have worsening redness, swelling, drainage or pain at the sites  - Any Symptoms of dehydration or diet intolerance such as nausea, vomiting, diarrhea, decreased urine output, weight  loss, decreased appetite or decreased oral intake  - Any Questions or Concerns    *Outpatient Follow-up Lab Plan:  You should have labs drawn weekly at [facility]: CBC with diff, CMP, GGT, Phos, Mg, INR, Tacrolimus trough level. You should get labs drawn BEFORE your morning tacrolimus dose.      Important Phone Numbers  [facility] Pediatric Gastroenterology / Liver Transplant: [phone]  [facility] Liver Transplant Coordinator: [name] Gross: [phone]  [facility] Liver Transplant Office: [phone]   [facility] Dermatology: [phone]  - [facility] Endocrinology: [phone]   [facility] Operator: [phone] for you specialists on-call     *If you are considering suicide, or if someone you know may be planning to harm him or herself, immediately call 911 or 317-159-3031 (National Suicide Prevention Hotline). You can also text CONNECT to [phone]) 720-8020 to connect with a free, confidential, 24 hour, trained crisis counselor*         Discharge instructions      Aqeel's Discharge Diabetes and Insulin Management Plan    - You should check your blood glucoses with meals, before snacks and at bedtime  ------------------------------------------------------------------------------  Home Insulin Plan   - Basal Insulin: Lantus 10 units injected subcutaneous every night at bedtime    - Meal Coverage: Humalog Insulin 6 units injected subcutaneous with meals if entire meal is eaten, 3 units if half meal is eaten     - Sliding Scale Insulin: BEFORE MEALS (breakfast, lunch, dinner, and late night meal if eaten)  Use Humalog Insulin injected subcutaneous as below   - If your blood glucose is 51-70 mg/dL, give juice/crackers    - If your blood glucose is 71-150 mg/dL, give no insulin 0 units   - If your blood glucose is 151-200 mg/dL, give 2 units    - If your blood glucose is 201-250 mg/dL, give 4 units    - If your blood glucose is 251-300 mg/dL, give  6 units    - If your blood glucose is 301-350 mg/dL, give  8 units    - If your blood glucose is 351-400 mg/dL, give  10 units   *If your blood glucose is Greater than 400, please call the [facility] Endocrinologist on-call for further instructions through the [facility] Operator [phone])*    --------------------------------------------------------------------------  Please Call [facility] Endocrinology or the [facility] Endocrinologist on-call through the hospital operator if you have any   - Any Signs or Symptoms of Hyperglycemia or if blood sugar is greater than 300  - Any Signs or Symptoms of Hypoglycemia or of blood sugar is greater than 80   - Any Ketones present in urine   - Any Medication Intolerance   - Any Nausea, vomiting, diarrhea or decreased oral intake   - Any Questions or Concerns     * It is very important that you record your blood glucoses in a log book and that you bring all of your diabetes supplies and glucometer to your outpatient follow-up appointments    IMPORTANT PHONE [facility]: [phone]  [facility] Operator: [phone]         Discharge instructions      Outpatient Follow-Up Appointments    PCP: Doran Stabler [facility]  - Date: 09/12/19, 4:10pm (VIDEO VISIT)  - Clinic Phone: [phone]    [facility] Pediatric Gastroenterology / Liver Transplant Clinic   - Date: 09/13/19 at 1:30pm  - Clinic Phone: [phone]    [facility]  Endocrinology Clinic located at [address], [address] [place]   - Date: 11/20 at 1:10pm (VIDEO VISIT)  - Clinic Phone: [phone]      [facility] Dermatology Clinic located in [address] [place], [place] [address]   - Date: 11/9 at 3:15pm with Dr. Lattie Haw  - Clinic Phone: [phone]     - Outpatient Psychiatry   - Date : 1) please see additional resources for identifying local psychiatrist 2) transplant psychology will coordinate with your appointment with GI/ liver transplant  - Clinic Phone: [phone]    IMPORTANT CONTACT NUMBERS:  Vernia Buff (RN transplant coordinator): [phone]  [name] Gross (appointment scheduling for transplant): [phone]  For urgent needs, call [phone] and ask for the on-call liver transplant coordinator; we have 24/7 coverage.             Follow Up instructions and Outpatient Referrals     Ambulatory referral to Dermatology      Ambulatory referral to Endocrinology      Ambulatory referral to Nutrition Services      Please contact patient with follow-up appointment date and time. Please coordinate nutrition appointment with existing outpatient follow-up appointments    Ambulatory referral to Psychiatry      Discharge instructions      Discharge instructions      Discharge instructions          Appointments which have been scheduled for you    Sep 13, 2019  9:00 AM  (Arrive by 8:30 AM)  RETURN  PSYCHOLOGY with Theda Sers, PhD  [facility] TRANSPLANT SURGERY [facility] REGION) [address]  [place] [address]  [phone]      Sep 13, 2019 10:30 AM  (Arrive by 10:00 AM)  LAB ONLY with LAB PHLEB GRND UNCW  LAB PHLEB GRND FLR [facility] REGION) [address]  [place] [address]  [phone]      Sep 13, 2019  1:30 PM  (Arrive by 1:00 PM)  RETURN  GENERAL with Glorianne Manchester, MD  [facility] CHILDRENS GASTROENTEROLOGY [place] [facility] REGION) [address]  [place] [address]  [phone]      Sep 19, 2019  3:15 PM  (Arrive by 3:00 PM)  NEW  GENERAL with Amy Lily Kocher, MD  [facility] REGION) [address] [place]  [place] [address]  [phone]      Sep 30, 2019  1:10 PM  (Arrive by 12:55 PM)  RETURN VIDEO - OTHER with Neill Loft, MD  [facility] DIABETES AND ENDOCRINOLOGY [place] [facility] REGION) [address]  [place] [address]  [phone]            Resources and Referrals     [facility]  [address]  [place], [place] [address]  Phone: [phone]  Fax: [phone]  Crisis Line: [phone]  Counties Served: [place], [place], [place], [place], [place]             PCP follow-up items:  -Patient was started on Effexor 24 hr capsule 37.5 mg, please continue to manage this medication/uptitrate it as necessary. If you feel uncomfortable managing this medications, per our inpatient psychiatry team, it is ok to curbside Dr. Florinda Marker regarding [name] medications - Phone: [phone], [facility] Psychiatry pager # [phone]. Note: He will be followed up via  telephone with transplant psychiatry but he will not be able to get in with them until ~3 months from now. After he establishes with them, they will be able to take over the management of [name] Effexor.   If he needs help finding a local therapist, please see information below (provided per our psychiatry inpatient team):   -Please ensure patient is scheduled for outpatient PFTs    Psychology Today: An up-to-date source of local providers, searchable by client issue, type of therapy, and insurances covered. ??You can also find psychiatrists for medication management in addition to psychotherapy.  ??  [email or web address]   ??  The following two providers/offices take Medicaid and are close to the patient's home address. Provided him with this information today and encouraged him to call:  ??  [facility]  [address]  [address]  [place],??[place]??[address]  Call Dr. Lorenda Ishihara Akintayo  [phone]  ??  [facility]  [address]  [address]  [place],????[address]  Call Dr. Neila Gear  [phone]    Margot Chimes MD  PGY3 Pediatrics  [phone]

## 2019-09-09 NOTE — Unmapped (Signed)
Pediatric Daily Progress Note     Assessment/Plan:     Principal Problem:    Liver transplant rejection (CMS-HCC)  Active Problems:    Recurrent major depressive disorder, in partial remission (CMS-HCC)    Malnutrition of mild degree (CMS-HCC)    History of liver transplant (CMS-HCC)    Transaminitis    Normocytic anemia    Hypoalbuminemia    Steroid-induced hyperglycemia    Pityrosporum folliculitis    Abdominal pain    Homeless    Thrombocytopenia (CMS-HCC)    Hypomagnesemia    Hypophosphatasia    Other ascites    MSSA bacteremia    Hyperbilirubinemia    Impetigo lesions of Right Ear and Posterior Neck    Steroid-induced acne    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 Along with steroids, tacrolimus, and cellcept, thyroglobulin treatment was initiated (on 10/01-10/12) but yielded limited benefit in liver studies to date. Patient has discussed prognosis with Dr. Melrose Gross and palliative care including the possibility of not recovering from liver rejection (10/9). Patient understands that transplant is an option but that he may not be a candidate. His main active issue currently is treating CLABSI secondary to strep mitis and MSSA (found after cultures obtained secondary to leukocytosis on daily labs) with Unasyn for a total of a 14 day course (through 10/27) and salvaging his PICC line for now given difficulty with access. Due to his infection all of his immunosuppressants (thymoblobulin, Cellcept, tacrolimus), were held, and his steroids were weaned. On 10/19, his immunosuppressants were restarted, and repeat liver biopsy was performed and was consistent with chronic rejection. His other active issues are complications secondary to liver dysfunction (hypoalbuminemia, ascites, edema, abdominal pain, elevated INR, electrolyte abnormalities) and subacute lymphopenia and thrombocytopenia (likely due to a combination of myelosuppressive meds and chronic illness, may improve after he finishes Unasyn course). Currently his ascites/edema is stable. He also has steroid-induced hyperglycemia, currently being managed on insulin with daily adjustments. Patient and his grandmother require diabetes education prior to discharge. The liver transplant committee determined patient should proceed with complete liver transplant evaluation. He and his grandmother met with the transplant team 10/28.     #Hx Liver Transplant - Subacute/Acute Liver Failure: ??Biopsy confirmed severe acute rejection (9/25), RAI 9/9. Liver labs without a significant improvement after immunosuppression. S/p 12 days of ATG and high dose MethylPrednisolone (10/1-10/12) with poor overall response to therapy. Labs 10/26 with D-dimer elevated to 1067, ammonia <9 (46), GGT 442 (465), Tbili 32.5 (33.1).  Liver biopsy completed on 10/21 which showed ductopenia with duct loss involving more than 50% of portal tracts (8 of 12 portal areas, 66.7%), degenerative changes in remaining bile ducts and irregular areas of periportal and centrilobular hepatocyte dropout and fibrosis with fibrous bridging, consistent with chronic rejection.   - F/u 10/21 liver bx C3D stain sent to [facility]  -Tacro and Mycophenolate restarted 10/20    -Tac 4 mg BID  - Continue prednisone 40mg  today  - Continue Valgancyclovir 450mg  daily (3months), Bactrim MWF (73mo) for  prophylaxis (10/2-) and Nystatin 10ml TID (55mo) prophylaxis (10/2-)  - Discontinue Vitamin K (10/15-10/25) given no significant improvement in PT/INR over the last week   - F/u Des-gamma-carboxy-prothrombin- if pt low in vit K, should result as elevated   - F/u factor V and VII  - Protonix 40 mg PO daily  - Continue Rifaximin   - PT/INR, CBC, CMP on M/Th   - AFP not completed bc sample icteric  - 10/21 liver transplant committee determined patient should proceed with complete liver transplant evaluation  - 10/28 liver transplant team met with patient and grandma   -PFTs unable to be performed, plan to do them outpt     #CLABSI: Strep mitis and MSSA   Developed sudden leukocytosis with white count elevated to 17.3 10/12, afebrile, non-toxic appearing, without localizing symptoms. Found to have positive Bcx 10/12 growing strep mitis and MSSA. Given ascites and risk for SBP secondary to strep mitis, also covering for anaerobes. Echo negative for endocarditis. Given patient had low WBC to 2.9 on 10/22, BCx repeated.  - 10/22 BCx NGTD  - CBC M/Th  - 10/12 peripheral/PICC Cx positive for Strep mitis   - 10/12 peripheral - +MSAA; 10/13 - NGTD  - 10/22-  NGTD  - Per ID recs:   - Unasyn x 14 days total (10/13-10/27)   - Plan for PICC removal prior to discharge    #Hypoalbuminemia- edema, ascites, secondary to liver dysfunction. Improved ascites s/p 25 g albumin and 40 mg lasix 10/26.  - Following daily weights  - Strict I/Os      #Nutriton: severe protein-calorie malnutrition. Prior to admission, patient has been skipping meals due to poor appetite and food insecurity. Patient continues to have good appetite.  - Continue goal PO fluid intake of 1.6L   - Regular diet +  Ensure supplements PRN  - Vit D3 5,000 units daily  - Aquadex multivitamin    - Ca 300 mg BID    #Hypomagnesemia, hypophosphatemia:   - qMTh CMP, Mg, Phos  - Hypomagnesemia: Mag sulphate IV prn for mg<2, Daily PO mag oxide 280 mg BID  - Hypophosphatemia: Neutra phos TID      #Constipation, resolved:   - Miralax 17 g BID PRN     #Steroid induced hyperglycemia:??A1c??4.9% but with postprandial hyperglycemia. Still on PO prednisone 40mg  daily.   -Lantus 10 units at bedtime (give 6 units if NPO)  -Restart nutritional insulin when eating: Lispro 3-6 units with meals (Hold if NPO)  -Continue Lispro correction 1:50>150 ACHS  -Endocrine following, appreciate recs  -Pt and his grandmother require diabetes education prior to discharge to ensure he able to administer his insulin at home    #Thrombocytopenia & lymphopenia: last plt transfusion 10/21 prior to liver biopsy  - Per ID, may improve after finishing unasyn course (which can cause myelosuppression). Bactrim can also cause myelosuppression, but ID would not recommend alternative agent for PJP ppx other than bactrim.  - Consider CBCd if bleeding    #Impetigo Skin Lesions on Right Ear and Left posterior Neck, + MSSA, healing well   - Mupirocin ointment for both lesions until healed, per derm    #Steroid-induced acne, possible fungal folliculitis   - Topical clindamycin, benzoyl peroxide, Ketoconazole shampoo    ??  #Major Depressive Disorder, insomnia: Stable. Has hx of attempted overdoses, with the last attempt in April 2019??at which point he was hospitalized in the Adolescent Psychiatry unit and discharged on Lexapro 20 mg. ??He has not engaged  with mental health resources or taken medication for depression or insomnia in 8-10 months.????Denies SI, passive suicidality, self-harm, or HI at this time.    - Started Effexor-XR 37.5 mg in AM  - Melatonin 3 mg q1800 for sleep and circadian rhythm regulation    #Deconditioning. Patient endorses feeling weak and having limited mobility.   -PT following  -Encourage OOB and ambulation    #Homelessness - Social Concerns:??Chey has experienced homelessness for ~5 months when he left his mother's house. He has severed all ties with his family and states his relationship with his mother cannot be reconciled. He also requested contact information of family members to be erased from his chart (this has been completed, but social worker did drop contact info into her note on 9/24) and he more recently has reconciled with some family members. ??In addition to food insecurity, he has not been able to secure medications. ??He is routinely exposed to alcohol and drugs (marijuana, cocaine, etc.) second-hand through his friendships though he denies current use himself. ??On urine drug screen, he tested positive for marijuana only.  -Supportive care following.  -Designated a personal friend [name]) and grandfather to be his HCPOAs (Friend is the Primary).   -Spoke with sister on 10/16 (has not been in regular contact)   -Patient to discharge home with grandma  ??  Access: PICC (placed 9/28), will need removal prior to discharge  ??  Discharge criteria:??Safe discharge planning, tentatively on Saturday, 10/31 with his grandmother    Subjective:  No acute events overnight. Met with psychiatry and restarted Effexor today, 10/30. Grandmother here but does not know how to administer insulin - she does not have a dx of diabetes. Patient confused his grandmothers.     Objective:     Vital signs in last 24 hours:  Temp:  [37.2 ??C] 37.2 ??C  Heart Rate:  [82-85] 82  Resp:  [16-18] 16  BP: (86-108)/(50-58) 91/56  MAP (mmHg):  [58-73] 66  SpO2:  [100 %] 100 %  Intake/Output last 3 shifts:  I/O last 3 completed shifts:  In: -   Out: 450 [Urine:450]    Physical Exam:   General:  Lying in bed. Thin appearing. Interactive and conversational. Has bandage in place over his right ear.   Head: Normocephalic, atraumatic.  Eyes: EOMI. Marked scleral icterus present.   Nose:   Clear, no discharge  Oropharynx: Thrush present in back of throat over uvula   Lungs: Clear breath sounds bilaterally, breathing comfortably on room air.   Heart: regular rate and rhythm. No murmurs appreciated on auscultation.  Abdomen:  Abdomen less protuberant. Ascites present but improved s/p albumin/lasix 10/26. No HSM.   Neuro: Deconditioned, moves all extremities with decreased strength. Persistent tremor present in his bilateral upper extremities.   Extremities: Non-pitting edema in the LE R>L, WWP  Skin: Skin lesions on left neck, healing well. Skin lesion present on right ear with scab, covered in gauze that is c/d/i. Acneform rash present on face, back, chest.     Active Medications reviewed and KEY Medications include:     Current Facility-Administered Medications:   ???  benzoyl peroxide 5 % gel, , Topical, Nightly, Alfredia Ferguson, MD  ???  calcium carbonate (OS-CAL) tablet 300 mg elem calcium, 300 mg elem calcium, Oral, BID, David Stall, MD, 300 mg elem calcium at 09/09/19 1038  ???  cholecalciferol (vitamin D3) tablet 5,000 Units, 5,000 Units, Oral, Daily, David Stall, MD, 5,000 Units at 09/09/19 [phone]  ???  clindamycin (CLEOCIN T) 1 % lotion 1 application, 1 application, Topical, daily, Alfredia Ferguson, MD, 1 application at 09/03/19 1628  ???  dextrose 50 % in water (D50W) 50 % solution 12.5 g, 12.5 g, Intravenous, Q10 Min PRN, David Stall, MD  ???  glucose chewable tablet 4 g, 4 g, Oral, 4x Daily PRN, Randall Hiss, MD  ???  heparin preservative-free injection 10 units/mL syringe (HEPARIN LOCK FLUSH), 20 Units, Intravenous, Daily PRN, David Stall, MD, 20 Units at 09/08/19 0530  ???  influenza vaccine quad (FLUARIX, FLULAVAL, FLUZONE) (6 MOS & UP) 2020-21, 0.5 mL, Intramuscular, During hospitalization, David Stall, MD  ???  insulin glargine (LANTUS) injection 10 Units, 10 Units, Subcutaneous, Nightly, Lovie Chol, MD, 10 Units at 09/08/19 2058  ???  insulin lispro (HumaLOG) injection 0-10 Units, 0-10 Units, Subcutaneous, 5XD insulin, Chinelo C Okigbo, MD, 2 Units at 09/09/19 1536  ???  insulin lispro (HumaLOG) injection 3-6 Units, 3-6 Units, Subcutaneous, ACHS, Juline Patch, MD, 3 Units at 09/09/19 1536  ???  insulin lispro (HumaLOG) injection 4-8 Units, 4-8 Units, Subcutaneous, Q AM, Alfredia Ferguson, MD, 4 Units at 09/09/19 0900  ???  ketoconazole (NIZORAL) 2 % shampoo, , Topical, daily, Alfredia Ferguson, MD  ???  magnesium oxide (MAG-OX) capsule 280 mg, 280 mg, Oral, BID, Alfredia Ferguson, MD, 280 mg at 09/09/19 0855  ???  melatonin tablet 3 mg, 3 mg, Oral, QPM, Jonna Clark, MD, 3 mg at 09/08/19 2059  ???  multivitamin, with zinc (AQUADEKS) chewable tablet, 2 tablet, Oral, Daily, David Stall, MD, 2 tablet at 09/09/19 0855  ???  mupirocin (BACTROBAN) 2 % ointment, , Topical, TID, David Stall, MD  ???  mycophenolate (MYFORTIC) EC tablet 540 mg, 540 mg, Oral, BID, David Stall, MD, 540 mg at 09/09/19 0855  ???  nystatin (MYCOSTATIN) oral suspension, 1,000,000 Units, Oral, TID, Alfredia Ferguson, MD  ???  ondansetron (ZOFRAN-ODT) disintegrating tablet 4 mg, 4 mg, Oral, Q12H PRN, Randall Hiss, MD  ???  pantoprazole (PROTONIX) EC tablet 40 mg, 40 mg, Oral, Daily, David Stall, MD, 40 mg at 09/08/19 2106  ???  polyethylene glycol (MIRALAX) packet 17 g, 17 g, Oral, BID PRN, David Stall, MD  ???  potassium & sodium phosphates 250mg  (PHOS-NAK/NEUTRA PHOS) packet 2 packet, 2 packet, Oral, TID, Lewie Loron, MD, Stopped at 09/09/19 1400  ???  predniSONE (DELTASONE) tablet 40 mg, 40 mg, Oral, Daily, David Stall, MD, 40 mg at 09/09/19 0854  ???  rifAXIMin (XIFAXAN) tablet 550 mg, 550 mg, Oral, BID, David Stall, MD, 550 mg at 09/09/19 0855  ???  sodium chloride (NS) 0.9 % infusion, , Intravenous, Continuous, David Stall, MD, Last Rate: 10 mL/hr at 08/26/19 0537, 1,000 mL at 08/27/19 0731  ???  sodium chloride (NS) 0.9 % infusion, 10 mL/hr, Intravenous, Continuous, Ejiofor A Lucky Cowboy., MD, Last Rate: 10 mL/hr at 09/04/19 1013, 10 mL/hr at 09/04/19 1013  ???  sulfamethoxazole-trimethoprim (BACTRIM) 400-80 mg tablet 80 mg of trimethoprim, 1 tablet, Oral, Q MWF, David Stall, MD, 80 mg of trimethoprim at 09/09/19 0855  ???  tacrolimus (PROGRAF) capsule 4 mg, 4 mg, Oral, BID, Alfredia Ferguson, MD  ???  valGANciclovir (VALCYTE) tablet 450 mg, 450 mg, Oral, Daily, David Stall, MD, 450 mg at 09/08/19 1812  ???  venlafaxine (EFFEXOR-XR) 24 hr capsule 37.5 mg, 37.5 mg, Oral, Daily, Juline Patch, MD, 37.5 mg  at 09/09/19 1610        Studies: Personally reviewed and interpreted.    Labs/Studies:  Lab Results   Component Value Date    WBC 2.2 (L) 09/08/2019    HGB 7.9 (L) 09/08/2019    HCT 24.4 (L) 09/08/2019    PLT 40 (L) 09/08/2019       Lab Results   Component Value Date    NA 134 (L) 09/08/2019    K 4.9 09/08/2019    CL 106 09/08/2019    CO2  09/08/2019      Comment:      Specimen icteric.    BUN 20 09/08/2019    CREATININE 0.88 09/09/2019    GLU 90 09/08/2019    CALCIUM 8.0 (L) 09/08/2019    MG 1.1 (L) 09/08/2019    PHOS 3.2 09/08/2019       Lab Results   Component Value Date    BILITOT 31.1 (H) 09/08/2019    BILIDIR 30.50 (H) 09/04/2019    PROT  09/08/2019      Comment:      Specimen icteric.    ALBUMIN 2.5 (L) 09/08/2019    ALT  09/08/2019      Comment:      Specimen icteric.    AST 103 (H) 09/08/2019    ALKPHOS 666 (H) 09/08/2019    GGT 382 (H) 09/08/2019       Lab Results   Component Value Date    PT 18.9 (H) 09/08/2019    INR 1.63 09/08/2019    APTT 25.0 (L) 08/14/2019       Allen Kell, MD  Pediatric Resident, PGY-1  Pager: [phone]

## 2019-09-09 NOTE — Unmapped (Signed)
Follow-up Pediatric Palliative Care Consult Note:  ??  [name] Gross is a 18 y.o. male seen for pediatric palliative care consultation at the request of Dr. Curtis Sites for goals of care, advance care planning, and family support.  ??  Primary Care Provider: [name], MD  ??  History provided by: Patient  ??  Assessment:  ??  [name] Gross is a 18 y.o. 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  After receiving treatment, his most recent liver biopsy on 10/21 showed results consistent with chronic rejection.  Kewon was encouraged to take measures to improve his candidacy for liver transplant, including addressing social barriers and receiving mental health counseling as he will likely need a transplant in the future.  He had a meeting with the liver transplant team on 10/27 to discuss his eligibility.  During this admission, Hamilton identified surrogate decision makers (he does not wish for this to default to next of kin) because he was at risk for further decompensation/further decline in health.  Additionally, Jenaro continues to work on completing Voicing My Choices ACP guide to help communicate his wishes to family, designated Research officer, political party, and his medical team.  Palliative care consult requested for goals of care, advance care planning, and family support.     ??  Summary of Discussion and Recommendations:   ??  1. SYMPTOMS:   - Jarius denied pain, nausea, poor appetite, insomnia, or decreased mood.  - He expressed excitement as he was anticipating discharge home with his grandmother today.  ??  2. GOALS:   - Leotha hopes to make a recovery, secure a place to live, and find a job.   - Rhian has long term goals which include going back to school and becoming a Runner, broadcasting/film/video or nurse.   ??  3. DECISIONS:   - Given risk of hepatic encephalopathy, and general risk of his condition deteriorating, recommended Johari think about who he would want to be his surrogate decision maker if he was unable to make decisions for himself (see ACP below)   - Orson likes to receive information directly.   - Dr. Melrose Nakayama has reviewed with Daeshaun that redo liver transplant can be an option for some people. Nasir acknowledged knowing that he may not be a candidate and that he doesn't know if he would choose to pursue another transplant. He is awaiting more conversations about this.  He has since stated that he would choose transplant if eligible.   - [facility] Decision Making Tool given and explained at a previous visit. He was encouraged to use this tool along with an ACP guide to document information he receives from providers.   ??  4. ADVANCE CARE PLANNING  - Tasheem has designated his friend [name] and grandfather Sharlet Salina as [name].  - Code status: Full code. Prognosis: Guarded given severity of liver disease and potential for lack of response to salvage therapies, AKI, and complex social situation with lack of caregiver support.  ??  5. SUPPORT:   - Gurtej describes limited personal supports including an aunt and maternal grandparents, and friends. He had not spoken to any family since being admitted to the hospital, but after 2 weeks, he has reached out to two friends and family shortly there after, to support him throug his illness.  - Mathews agreed to begin SSI application as suggested by transplant social  worker, as the process could take months to complete.  He was also told how to access his medical records.  ??  6. CARE COORDINATION:   - Care coordinated with nursing.         Total time spent with patient for evaluation & management (excluding ACP documented separately): 15 minutes.  Greater than 50% of this time spent on counseling/coordination of care: Yes.  See ACP Note from today for additional billable service: Yes.     We appreciate the consult. The Children's Supportive Care Team can be reached by pager [name]) or email ([email or web address]).   ??  ??  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 His hospital course has been complicated by AKI, significant ascites. His most recent liver biopsy on 10/21 showed chronic rejection. He is currently stable enough for discharge but will likely need transplant in the future.    ??  Interval events: [name] Gross was in good spirits as he was anticipating discharge home today with his paternal grandmother [name] Gross).  He was praised for all of the hard work he had done regarding ACP and reconnecting with family.  He was encouraged to continue conversations with his family about his wishes (Voicing My Choices).  Additionally, he was encouraged to legally document his HCPOAs so that it would be in place should he become acutely ill and/or decompensate quickly before naming decision makers.  Cypress expressed understanding.    [name] Gross shared that he would be seeking mental health services once he was outpatient as recommended by his care teams to improve his candidacy for liver transplant.  We shared our hope for his continued recovery and the hope that he would have the opportunity to purse his goals of work, school, and stability (e.g. housing, finances, health/wellness).       Allergies:  Bee pollen and Pollen extracts  ??  Medications:  Scheduled Meds:  ??? benzoyl peroxide   Topical Nightly   ??? calcium carbonate  300 mg elem calcium Oral BID   ??? cholecalciferol (vitamin D3)  5,000 Units Oral Daily   ??? clindamycin  1 application Topical daily   ??? flu vacc [id number] 6mos up(PF)  0.5 mL Intramuscular During hospitalization   ??? insulin glargine  10 Units Subcutaneous Nightly   ??? insulin lispro  0-10 Units Subcutaneous 5XD insulin   ??? insulin lispro  3-6 Units Subcutaneous ACHS   ??? insulin lispro  4-8 Units Subcutaneous Q AM   ??? ketoconazole   Topical daily   ??? magnesium oxide  280 mg Oral BID   ??? melatonin  3 mg Oral QPM   ??? multivitamin, with zinc  2 tablet Oral Daily   ??? mupirocin   Topical TID   ??? mycophenolate  540 mg Oral BID   ??? nystatin  1,000,000 Units Oral TID   ??? pantoprazole  40 mg Oral Daily   ??? potassium & sodium phosphates 250mg   2 packet Oral TID   ??? predniSONE  40 mg Oral Daily   ??? rifAXIMin  550 mg Oral BID   ??? sulfamethoxazole-trimethoprim  1 tablet Oral Q MWF   ??? tacrolimus  4 mg Oral BID   ??? valGANciclovir  450 mg Oral Daily   ??? venlafaxine  37.5 mg Oral  Daily     Continuous Infusions:  ??? sodium chloride 500 mL (08/26/19 0537)   ??? sodium chloride 10 mL/hr (09/04/19 1013)     PRN Meds:.dextrose 50 % in water (D50W), glucose, heparin, porcine (PF), ondansetron, polyethylen glycol    Physical Exam:   Vitals:    09/09/19 0900   BP: 91/56   Pulse: 82   Resp: 16   Temp:    SpO2: 100%   ??  Gen: thin young man, lying in bed, NAD.  Skin: jaundiced.  HEENT: +scleral icterus.   Chest: normal work of breathing.  Abd: protruberant.  Psych: in good spirits, engaging.??    Data: Reviewed test results in Epic.    La Lindwood Qua, [place], CPNP  Children's Supportive Care Team  [phone] (pager)        Collaborating Physician Attestation    I was the supervising physician in the delivery of the service. I discussed Somtochukwu's case with [facility], CPNP and was involved with making the recommendations contained in this note.     Waldon Reining, MD, MPH  Attending Physician, Children's Supportive Care Team

## 2019-09-09 NOTE — Unmapped (Signed)
Pt VSS and afebrile this shift; continues on all medications as ordered; BG checked before meals and insulin given per orders, pt only ate half of each meal; PICC site C/D/I, plan is for it to be pulled before discharge; potential discharge tomorrow with grandmother; will continue to monitor     Problem: Adult Inpatient Plan of Care  Goal: Plan of Care Review  Outcome: Ongoing - Unchanged  Goal: Patient-Specific Goal (Individualization)  Outcome: Ongoing - Unchanged  Goal: Absence of Hospital-Acquired Illness or Injury  Outcome: Ongoing - Unchanged  Goal: Optimal Comfort and Wellbeing  Outcome: Ongoing - Unchanged  Goal: Readiness for Transition of Care  Outcome: Ongoing - Unchanged  Goal: Rounds/Family Conference  Outcome: Ongoing - Unchanged     Problem: Wound  Goal: Optimal Wound Healing  Outcome: Ongoing - Unchanged     Problem: Fall Injury Risk  Goal: Absence of Fall and Fall-Related Injury  Outcome: Ongoing - Unchanged     Problem: Self-Care Deficit  Goal: Improved Ability to Complete Activities of Daily Living  Outcome: Ongoing - Unchanged     Problem: Infection  Goal: Infection Symptom Resolution  Outcome: Ongoing - Unchanged

## 2019-09-10 MED ORDER — SODIUM CHLORIDE 0.9 % IV SOLN
INTRAVENOUS | Status: DC
Start: ? — End: 2019-09-10

## 2019-09-10 MED ORDER — PANTOPRAZOLE SODIUM 40 MG PO TBEC
40.00 | DELAYED_RELEASE_TABLET | ORAL | Status: DC
Start: 2019-09-10 — End: 2019-09-10

## 2019-09-10 MED ORDER — VALGANCICLOVIR HCL 450 MG PO TABS
450.00 | ORAL_TABLET | ORAL | Status: DC
Start: 2019-09-10 — End: 2019-09-10

## 2019-09-10 MED ORDER — KETOCONAZOLE 2 % EX SHAM
MEDICATED_SHAMPOO | CUTANEOUS | Status: DC
Start: 2019-09-11 — End: 2019-09-10

## 2019-09-10 MED ORDER — GENERIC EXTERNAL MEDICATION
4.00 | Status: DC
Start: ? — End: 2019-09-10

## 2019-09-10 MED ORDER — RIFAXIMIN 550 MG PO TABS
550.00 | ORAL_TABLET | ORAL | Status: DC
Start: 2019-09-10 — End: 2019-09-10

## 2019-09-10 MED ORDER — SULFAMETHOXAZOLE-TRIMETHOPRIM 400-80 MG PO TABS
1.00 | ORAL_TABLET | ORAL | Status: DC
Start: 2019-09-12 — End: 2019-09-10

## 2019-09-10 MED ORDER — INSULIN LISPRO 100 UNIT/ML ~~LOC~~ SOLN
3.00 | SUBCUTANEOUS | Status: DC
Start: 2019-09-10 — End: 2019-09-10

## 2019-09-10 MED ORDER — NYSTATIN 100000 UNIT/ML MT SUSP
1000000.00 | OROMUCOSAL | Status: DC
Start: 2019-09-10 — End: 2019-09-10

## 2019-09-10 MED ORDER — CLINDAMYCIN PHOSPHATE 1 % EX LOTN
1.00 | TOPICAL_LOTION | CUTANEOUS | Status: DC
Start: 2019-09-10 — End: 2019-09-10

## 2019-09-10 MED ORDER — CHOLECALCIFEROL 25 MCG (1000 UT) PO TABS
5000.00 | ORAL_TABLET | ORAL | Status: DC
Start: 2019-09-11 — End: 2019-09-10

## 2019-09-10 MED ORDER — DEXTROSE 50 % IV SOLN
12.50 | INTRAVENOUS | Status: DC
Start: ? — End: 2019-09-10

## 2019-09-10 MED ORDER — MUPIROCIN 2 % EX OINT
TOPICAL_OINTMENT | CUTANEOUS | Status: DC
Start: 2019-09-10 — End: 2019-09-10

## 2019-09-10 MED ORDER — MAGNESIUM OXIDE 140 MG PO CAPS
280.00 | ORAL_CAPSULE | ORAL | Status: DC
Start: 2019-09-10 — End: 2019-09-10

## 2019-09-10 MED ORDER — INSULIN LISPRO 100 UNIT/ML ~~LOC~~ SOLN
0.00 | SUBCUTANEOUS | Status: DC
Start: 2019-09-10 — End: 2019-09-10

## 2019-09-10 MED ORDER — ONDANSETRON 4 MG PO TBDP
4.00 | ORAL_TABLET | ORAL | Status: DC
Start: ? — End: 2019-09-10

## 2019-09-10 MED ORDER — TACROLIMUS 1 MG PO CAPS
4.00 | ORAL_CAPSULE | ORAL | Status: DC
Start: 2019-09-10 — End: 2019-09-10

## 2019-09-10 MED ORDER — SODIUM CHLORIDE 0.9 % IV SOLN
10.00 | INTRAVENOUS | Status: DC
Start: ? — End: 2019-09-10

## 2019-09-10 MED ORDER — POLYETHYLENE GLYCOL 3350 17 G PO PACK
17.00 | PACK | ORAL | Status: DC
Start: ? — End: 2019-09-10

## 2019-09-10 MED ORDER — BENZOYL PEROXIDE 5 % EX GEL
CUTANEOUS | Status: DC
Start: 2019-09-10 — End: 2019-09-10

## 2019-09-10 MED ORDER — GENERIC EXTERNAL MEDICATION
2.00 | Status: DC
Start: 2019-09-10 — End: 2019-09-10

## 2019-09-10 MED ORDER — MELATONIN 3 MG PO TABS
3.00 | ORAL_TABLET | ORAL | Status: DC
Start: 2019-09-10 — End: 2019-09-10

## 2019-09-10 MED ORDER — GENERIC EXTERNAL MEDICATION
2.00 | Status: DC
Start: 2019-09-11 — End: 2019-09-10

## 2019-09-10 MED ORDER — PREDNISONE 20 MG PO TABS
40.00 | ORAL_TABLET | ORAL | Status: DC
Start: 2019-09-11 — End: 2019-09-10

## 2019-09-10 MED ORDER — VENLAFAXINE HCL ER 37.5 MG PO CP24
37.50 | ORAL_CAPSULE | ORAL | Status: DC
Start: 2019-09-11 — End: 2019-09-10

## 2019-09-10 MED ORDER — INSULIN GLARGINE 100 UNIT/ML ~~LOC~~ SOLN
10.00 | SUBCUTANEOUS | Status: DC
Start: 2019-09-10 — End: 2019-09-10

## 2019-09-10 MED ORDER — GENERIC EXTERNAL MEDICATION
540.00 | Status: DC
Start: 2019-09-10 — End: 2019-09-10

## 2019-09-10 MED ORDER — CALCIUM CARBONATE 600 MG PO TABS
ORAL_TABLET | ORAL | Status: DC
Start: 2019-09-10 — End: 2019-09-10

## 2019-09-10 MED ORDER — GENERIC EXTERNAL MEDICATION
20.00 | Status: DC
Start: ? — End: 2019-09-10

## 2019-09-10 MED ORDER — VENLAFAXINE ER 37.5 MG CAPSULE,EXTENDED RELEASE 24 HR
ORAL_CAPSULE | Freq: Every day | ORAL | 0 refills | 30 days | Status: CP
Start: 2019-09-10 — End: 2019-10-10
  Filled 2019-09-09: qty 30, 30d supply, fill #0

## 2019-09-10 NOTE — Unmapped (Signed)
Pt afebrile, VSS. Gave self lispro shot at beginning of shift, refused to give himself his lanus dose said his tremors were too bad and dose was too large. Pt said we would do his lispro again today. No c/o pain overnight, resting well. No family present. Will continue to monitor.

## 2019-09-10 NOTE — Unmapped (Signed)
Endocrine Virtual Care Note                  **This patient was not seen in person today. The Endocrine service has moved to a virtual model when possible to minimize potential spread of COVID-19, protect patients/providers and reduced PPE utilization.  During this time, we will be limiting person-to-person contact when possible.**     Patients chart, including labs, imaging, medications, vitals and other notes have been reviewed. Based on this review we have no new recommendations at this time.      Time spent reviewing chart: 5 minutes     Patient being discharged today. Will sign off at this time.      Please page with questions or concerns: Endocrine fellow on call: [phone]     I was immediately available via phone/pager or present on site.  I reviewed and discussed the case with the resident, but did not see the patient.  I agree with the assessment and plan as documented in the resident's note.     Maximino Greenland, MD, MPH  Attending - Endocrinology, Diabetes, and Metabolism    Time spent reviewing chart 5 minutes  Time spent with fellow 5 minutes

## 2019-09-10 NOTE — Unmapped (Signed)
Diabetes education consultation: Consulted for assistance with providing instruction on diabetes self-management skills. Visit with Shaune Spittle Gross and his grandmother at the bedside. Spent 50 minutes providing diabetes education.     Assessment: . 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  He has steroid-induced hyperglycemia, and is on a basal-bolus insulin regimen.     A1C:    Lab Results   Component Value Date    A1C 5.0 08/17/2019     Most of the skills instruction was focused with pt's grandmother, as pt already familiar with most of the information and skills.  He observed and participated in discussion throughout the visit.  Pt has significant tremors, and his grandmother will be helping as needed.    Monitoring:   Pt received his glucose monitoring equipment late this afternoon.  Instructed on use.  Pt's grandmother returned demonstration by performing a glucose check, loading lancing device, placing test strip in meter correctly, obtaining a fingerstick sample, and applying sample to end of test strip. Instructed on sharps disposal.  Provided with copies of large print log sheets to maintain a record of readings & instructed to bring to follow-up visits with provider for evaluation of treatment plan. Encouraged to discuss individualized glucose goals with provider he will be following up with after discharge.    Problem-solving: Hypoglycemia:    Instructed on hypoglycemia recognition & treatment, including 15-15 rule, listing several examples of appropriate fast carb sources, emphasizing importance of having something readily available. Described possible causes and prevention. Pt able to list several early symptoms of hypoglycemia and 2 appropriate fast glucose choices.    Provided printed material: Hypoglycemia in Diabetes, and Low Blood Glucose with pictures as resources.  He has been prescribed GVoke PFS glucagon.  Neither pt nor his grandmother were familiar with Glucagon.  Described Glucagon and that it is used to treat hypoglycemia when pt is unable to take something safely by mouth, such as if unconscious, or too lethargic to have oral treatment without out risk of choking/aspiration.  Shown package with PFS (pre-filled syringe) inside and reviewed procedure for use, using the pictures and description on side of package.  Instructed to place pt on side after administration (with rationale)  and call 911.  Instructed on providing fast glucose when pt alert and snack 15 minutes later.  Provided How to give a Glucagon Shot printed material and reinforced 1st step is different with PFS as there is no reconstitution of medication with PFS formulation.   Discussed storage and expiration date.    Insulin administration & safety:   PT and grandmother were taught insulin administration skills by pt's nurse, as he has been prescribed insulin vials and syringes and have already in fact received his supplies from pharmacy.    Pt's tremors are significant, making measuring and injecting insulin challenging.  While reviewing insulin safety information instructed on storage and discard date for in-use insulin vials.  Informed pt and grandmother of insulin pens that make insulin administration easier and because of small doses, is  more efficient/less wasteful due to discard date for vial (1000 units) is same as for insulin pen (300 units).  His grandmother verbalized interest..  Because I had a clean insulin pen teaching kit available, provided instruction for her and for pt on insulin administration using pens.  [name] Gross and his grandmother returned demonstration using demo equipment & did well, stating they felt confident with ability. Mak was able to do skills without bending needle  even with his tremors - he smiled at the end.   They will likely request switching to pens with provider when current supplies running low.  Provided printed instructions on insulin administration with pens as a resource.  They had no further questions.  Pt 's grandmother said she felt very confident in ability to help care for pt.    Thank You,   Jacqulynn Cadet, MS, RN, CDCES, Diabetes CNES, pager [phone]

## 2019-09-10 NOTE — Unmapped (Addendum)
PICC dc'd prior to leaving, flu shot administered (okay per MD). Patient and grandmother verbalized understanding of all discharge instruction and follow up appointments. All questions answered. Patient discharged home with grandmother and all belongings

## 2019-09-10 NOTE — Unmapped (Addendum)
[name] Gross, VSS on room air. Went over Gross administration and glucose checks w/ [name] Gross and [name] Gross. Both demonstrated proper technique of drawing up Gross, verbalized which sites are appropriate for Gross and the need to rotate sites, and how to give the injection. Also went over hypoglycemia care. [name] Gross demonstrated proper technique using the lancet to poke and check glucose. [name] Gross and [name] Gross both drew up correct amounts of Gross from vial. [name] Gross gave [name] Gross 1x Gross injection. [name] Gross attempted to give himself an injection but was unable to d/t shaking of his hands and arms. [name] Gross that he will need to be able to draw up and administer Gross to himself in order to go home. [name] Gross was finally able to give himself his dinner time Gross. UOP adequate, BM x1. PICC is C/D/I, heplocked @800  after labs. [name] Gross is at bedside and involved in care. Will continue to monitor and follow plan of care.    Problem: Adult Inpatient Plan of Care  Goal: Plan of Care Review  Outcome: Progressing  Goal: Patient-Specific Goal (Individualization)  Outcome: Progressing  Goal: Absence of Hospital-Acquired Illness or Injury  Outcome: Progressing  Goal: Optimal Comfort and Wellbeing  Outcome: Progressing  Goal: Readiness for Transition of Care  Outcome: Progressing  Goal: Rounds/Family Conference  Outcome: Progressing     Problem: Wound  Goal: Optimal Wound Healing  Outcome: Progressing     Problem: Fall Injury Risk  Goal: Absence of Fall and Fall-Related Injury  Outcome: Progressing     Problem: Self-Care Deficit  Goal: Improved Ability to Complete Activities of Daily Living  Outcome: Progressing     Problem: Infection  Goal: Infection Symptom Resolution  Outcome: Progressing

## 2019-09-12 ENCOUNTER — Other Ambulatory Visit: Payer: Self-pay

## 2019-09-12 ENCOUNTER — Ambulatory Visit (INDEPENDENT_AMBULATORY_CARE_PROVIDER_SITE_OTHER): Payer: Medicaid Other | Admitting: Pediatrics

## 2019-09-12 DIAGNOSIS — T8641 Liver transplant rejection: Secondary | ICD-10-CM

## 2019-09-12 DIAGNOSIS — Z944 Liver transplant status: Secondary | ICD-10-CM | POA: Diagnosis not present

## 2019-09-12 NOTE — Unmapped (Signed)
Called pt and his grandmother answered; he has had some difficulty with his phone and they were at the mobile phone provider store now.  No specific questions or concerns stated, other than that the grandmother says she is learning about what his needs are but feels they are getting on the same page.  Discussed likely lab frequency as outpatient.  He will have labs drawn tomorrow at [facility] appointments (and I reminded the grandmother that he should not take his tacrolimus until after the lab draw) but he will need labs likely weekly afterwards, at [facility].

## 2019-09-12 NOTE — Progress Notes (Signed)
[phone]  5:05 no answer after 5 min; no answer to nudge call 5:27 2nd try; no answer after 4 min; 5:31 call nudge; GM answered and had missed any text because she was on computer 5:30 connected  Virtual visit via video note  I connected by video-enabled telemedicine application with Joel Peterson 's grandmother on 09/12/19 at  4:10 PM EST and verified that I was speaking about the correct person using two identifiers.   Location of patient/parent: grandmother's home  I discussed the limitations of evaluation and management by telemedicine and the availability of in person appointments.  I explained that the purpose of the video visit was to provide medical care while limiting exposure to the novel coronavirus.  The patient and grandmother expressed understanding and agreed to proceed.    Reason for visit:  Hospital follow up  History of present illness:  Released from [facility] after more than one month hospitalization for complications/rejection of liver transplant 3 years ago Unstable social situation due to transient housing for past several months Now with [name] Joel Peterson, widowed 2 years ago Other family nearby  Treatments/meds tried: discharged on revised long list Change in appetite: poor Change in sleep: still re establishing good pattern Change in stool/urine: no  Ill contacts: no  Observations/objective:  Only face visible; lying on couch Eyes - icteric Occasional smile  Assessment/plan:  1. S/P liver transplant ([place]) 3 years out  Complicated by youth, identity issues, and poor adherence to date with medication regimen and healthy lifestyle  2. Acute rejection of liver transplant ([place]) Medicated and stabilized over past month at [facility] commitment to continuing difficult medication regimen to maintain  [name] very supportive    Follow up instructions:  Call again with worsening of symptoms, lack of improvement, or any new concerns. [name] Joel Peterson and Joel Peterson  both aware of multiple appts tomorrow at [facility].   I discussed the assessment and treatment plan with the patient and/or parent/guardian, in the setting of global COVID-19 pandemic with known community transmission in [place], and with no widespread testing available.  Seek an in-person evaluation in the emergency room with covid symptoms - fever, dry cough, difficulty breathing, and/or abdominal pains.   They were provided an opportunity to ask questions and all were answered.  They agreed with the plan and demonstrated an understanding of the instructions.  I provided 13 minutes in this encounter, including both face-to-face video and care coordination time. I was located in clinic during this encounter.  Santiago Glad, MD

## 2019-09-13 ENCOUNTER — Encounter: Admit: 2019-09-13 | Discharge: 2019-09-13 | Payer: PRIVATE HEALTH INSURANCE | Attending: Clinical | Primary: Clinical

## 2019-09-13 ENCOUNTER — Encounter: Admit: 2019-09-13 | Discharge: 2019-09-13 | Payer: PRIVATE HEALTH INSURANCE

## 2019-09-13 ENCOUNTER — Encounter
Admit: 2019-09-13 | Discharge: 2019-09-13 | Payer: PRIVATE HEALTH INSURANCE | Attending: Pediatric Gastroenterology | Primary: Pediatric Gastroenterology

## 2019-09-13 DIAGNOSIS — F3341 Major depressive disorder, recurrent, in partial remission: Principal | ICD-10-CM

## 2019-09-13 DIAGNOSIS — K7210 Chronic hepatic failure without coma: Principal | ICD-10-CM

## 2019-09-13 LAB — COMPREHENSIVE METABOLIC PANEL
ALBUMIN: 3.3 g/dL — ABNORMAL LOW (ref 3.5–5.0)
ALKALINE PHOSPHATASE: 1106 U/L — ABNORMAL HIGH (ref 65–260)
AST (SGOT): 317 U/L — ABNORMAL HIGH (ref 19–55)
BLOOD UREA NITROGEN: 25 mg/dL — ABNORMAL HIGH (ref 7–21)
BUN / CREAT RATIO: 16
CALCIUM: 8.8 mg/dL (ref 8.5–10.2)
CHLORIDE: 111 mmol/L — ABNORMAL HIGH (ref 98–107)
CREATININE: 1.59 mg/dL — ABNORMAL HIGH (ref 0.70–1.30)
EGFR CKD-EPI AA MALE: 72 mL/min/{1.73_m2} (ref >=60)
EGFR CKD-EPI NON-AA MALE: 62 mL/min/{1.73_m2} (ref >=60)
GLUCOSE RANDOM: 106 mg/dL (ref 70–179)
SODIUM: 138 mmol/L (ref 135–145)

## 2019-09-13 LAB — GAMMA GLUTAMYL TRANSFERASE: Gamma glutamyl transferase:CCnc:Pt:Ser/Plas:Qn:: 471 — ABNORMAL HIGH

## 2019-09-13 LAB — BILIRUBIN DIRECT: Bilirubin.glucuronidated+Bilirubin.albumin bound:MCnc:Pt:Ser/Plas:Qn:: 33 — ABNORMAL HIGH

## 2019-09-13 LAB — MAGNESIUM
MAGNESIUM: 1.4 mg/dL — ABNORMAL LOW (ref 1.6–2.2)
Magnesium:MCnc:Pt:Ser/Plas:Qn:: 1.4 — ABNORMAL LOW

## 2019-09-13 LAB — CALCIUM: Calcium:MCnc:Pt:Ser/Plas:Qn:: 8.8

## 2019-09-13 NOTE — Unmapped (Signed)
Confidential Psychological Therapy Session  [facility] for Transplant Care    Patient Name: JOSHUA SOULIER  Medical Record Number: [id number]  Date of Service: September 13, 2019  Clinical Psychologist: Artemio Aly, PhD  Intern: None  Time Spent: 45 min of face-to-face counseling  CPT Procedure Code: 81191 (45 min psychotherapy with patient and/or family)  Therapy Type: Behavior Modifying/Cognitive Behavioral Therapy (CBT)  Purpose of Treatment: adjustment to chronic medical illness,??improve coping, reduce depression symptoms.    Referral/Relevant History:  Mr.????[name]??is a very pleasant 18 y.o.????male??who presents for cognitive behavioral therapy to address adjustment to chronic medical illness (and recent hospitalization for liver transplant rejection). He was seen by the adult inpatient psychiatry team on 08/23/19 for a history of MDD (and two previous suicide attempts, last attempt April 2019) and current depressive symptoms. During assessment??with psychiatry, Mr. Lucier declined pharmacotherapy but stated that he would be interested in receiving therapy services while inpatient to work through his difficult medical diagnoses and family situation.??He was seen by this provider for an initial visit on 08/29/19 and noted improved mood after reconnecting with social support, and requested continued psychology follow-up while inpatient. He was seen again by inpatient psychiatry on 09/01/19 and continued to decline pharmacotherapy, but later expressed interest in this and was prescribed venlafaxine prior to discharge from the hospital on 09/10/19.    Review of Symptoms/ROS: Deferred    Subjective:   Mr. Quesinberry reported that he has been great since being discharged on 09/10/19 and is very glad to be home. He described several ongoing symptoms that he has been trying to manage, including a tremor in his hands and feeling cold, but otherwise denied any physical distress. He has been living with his grandmother for the past several days, and notes that things are going well with her; she is learning to be a nurse by helping him with insulin injections and medication management.     Mr. Pecina described his mood as fine and cheerful, and he denied any current concerns with depression or anxiety symptoms. He noted that he has not been doing very much besides watching TV at home, but is focused on building strength and stamina before doing too much and has been doing his PT leg exercises at home. He was unable to recall the name of the antidepressant he was prescribed, but reports that he has been taking it as prescribed; he has not noticed any benefits or side effects yet.     During discussion of behavioral activation strategies, Mr. Poehler identified a short-term goal of improving strength in his legs and a long-term goal of returning to school for nursing or teaching. He brainstormed activities that are consistent with these goals, like watching YouTube videos about nursing and doing his PT exercises, and agreed to set a SMART goal to watch one educational YouTube video per day for 15 minutes.     Objective:   Mental Status Exam:  Appearance: Malnourished, jaundiced, and cold (bundled up in a jacket and scarf)  Motor: No abnormal movements  Speech/Language: Quiet speech, but normal rate and rhythm   Mood: Fine, cheerful  Affect: Sad and somewhat flat, but he noted that he has always been quiet and private at baseline  Thought Process: Logical, linear, clear, coherent, goal directed  Thought Content: Denies SI, HI, self harm, delusions, obsessions, paranoid ideation, or ideas of reference  Perceptual Disturbances: Denies auditory and visual hallucinations, behavior not concerning for response to internal stimuli  Orientation: Oriented to person, place,  time, and general circumstances  Attention: Able to fully attend without fluctuations in consciousness  Concentration: Able to fully concentrate and attend  Memory: Immediate, short-term, long-term, and recall grossly intact  Fund of Knowledge: Consistent with level of education and development  Insight: Intact  Judgment: Intact  Impulse Control: Intact    Assessment:  Mr.  Pinedo participated well in this CBT session and exhibits good motivation towards treatment goals.     Mr.??[name] described his mood as fine and cheerful, but described symptoms of both depression and anxiety prior to his hospitalization and for the first several weeks while being in the hospital. He also continues to look mildly depressed, as he is quiet and somewhat difficult to engage; however, he notes that this may be his baseline and that he is a quiet and private person in general. He appears to meet criteria for recurrent MDD, and may be in early remission at this time given recent improvements in the past two weeks. It is likely that continuing to promote ways to help Mr. Hinch remain engaged and socially connected will be helpful, and now that he has been discharged from the hospital, behavioral activation and goal setting may be particularly useful. Additionally, exploring his values in life and finding ways to help him maintain some independence will likely be very beneficial. Improvements??in depression and anxiety symptoms can contribute to improved quality of life, functionality, and ability to cope with chronic illness.   ??  Focus on current treatment is??reduction of depressive symptoms, increasing behavioral activation, adjustment to chronic medical conditions.   ??  Focus on future sessions will include??values exploration, behavioral activation, learning cognitive coping strategies, relaxation strategies.  ??  Adherence concerns:??No concerns raised today. Mr. Remmers reports that he and his grandmother have been staying on top of managing his medications and giving him insulin.   ??  Diagnostic Impression:??Major Depressive Disorder, Recurrent Episode, Moderate Severity, in early remission    Risk Assessment:  A suicide and violence risk assessment was performed as part of this evaluation. The patient is deemed to be at chronic elevated risk for self-harm/suicide given the following factors: male age 60-35, current diagnosis of depression, previous acts of self-harm and chronic severe medical condition. The patient is deemed to be at chronic elevated risk for violence given the following factors: male gender and younger age. These risk factors are mitigated by the following factors:lack of active SI/HI, no know access to weapons or firearms, motivation for treatment, presence of an available support system, expresses purpose for living, current treatment compliance and safe housing. There is no acute risk for suicide or violence at this time. The patient was educated about relevant modifiable risk factors including following recommendations for treatment of psychiatric illness and abstaining from substance abuse.    While future psychiatric events cannot be accurately predicted, the patient does not currently require  acute inpatient psychiatric care and does not currently meet [facility] involuntary commitment criteria.      Psychometric Testing: Pt completed the PHQ-9, which is a brief screener of depressive symptoms. He obtained a score of 3, which is indicative of minimal symptoms of depression.    Pt also completed the GAD-7, which is a brief screener of anxiety symptoms. He obtained a score of 2, which is indicative of minimal symptoms of anxiety.       Plan:  Mr.  Estis will continue meeting with me for CBT, and was given instructions for therapeutic homework, including reviewing written materials provided  about behavioral activation and goal setting. He also agreed to set a SMART goal to watch an educational YouTube video once/day for 15 minutes.      Mr.  Penkala will return in approximately 2 weeks, dependent on other medical appts. If possible, he will be scheduled to be seen concurrent with other appts; otherwise, writer will call Mr. Uphoff to set up a virtual follow-up visit.      Mr.  Chichester was given this writer's business card with confidential voice mail number and instructed to call 911 for emergencies.

## 2019-09-14 LAB — TACROLIMUS, TROUGH: Lab: 13

## 2019-09-14 NOTE — Unmapped (Signed)
Assessment/Plan:        Liver transplant rejection - ? Worsening renal function - will recheck on Nov 5 - if still elevated, may need admission for IV fluids - [name] BUN and albumin were higher so this may indicate poor oral fluid intake - he denies excess urination (due to diabetes?)        Subjective:     Patient ID: [name] Gross is a 18 y.o. male.    HPI - this patient had an in person visit for f/u liver transplant rejection - hx provided by patient and [name] Gross  - he was discharged 4 days ago  - no obvious changes - still very jaundice - ascites is little improved - no peripheral edema  - still tremulous - ? tacro  - labs done showed same liver results but creatinine jumped to 1.5 from 0.9  - I asked them to repeat in 2 days and drink more fluids  - taking [name] meds appropriately  - taking insulin at least 4X daily and they showed [name] chart  - no fever, no pain, no vomiting, stools ok  - sleeping ok - no evidence of encephalopathy - no liver flap  - he saw Psych today    The following portions of the patient's history were reviewed and updated as appropriate: allergies, current medications, past family history, past medical history, past social history, past surgical history and problem list.      Review of Systems   Constitutional: Negative.    HENT: Negative.    Eyes: Negative.    Respiratory: Negative.    Cardiovascular: Negative.    Gastrointestinal: Positive for abdominal distention. Negative for abdominal pain, anal bleeding, blood in stool, constipation, diarrhea, nausea, rectal pain and vomiting.   Endocrine: Negative.    Genitourinary: Negative.    Musculoskeletal: Negative.    Skin: Positive for color change.   Neurological: Positive for tremors.   Hematological: Negative.    Psychiatric/Behavioral: Negative.            Objective:    Physical Exam  Constitutional:       General: He is not in acute distress.     Appearance: He is not ill-appearing, toxic-appearing or diaphoretic.   HENT: Head: Normocephalic.      Mouth/Throat:      Mouth: Mucous membranes are moist.   Eyes:      Pupils: Pupils are equal, round, and reactive to light.   Neck:      Musculoskeletal: Normal range of motion.   Cardiovascular:      Rate and Rhythm: Normal rate.   Abdominal:      General: There is distension.      Palpations: There is no mass.      Tenderness: There is no abdominal tenderness. There is no right CVA tenderness, left CVA tenderness, guarding or rebound.      Hernia: No hernia is present.   Musculoskeletal: Normal range of motion.   Skin:     General: Skin is warm.      Capillary Refill: Capillary refill takes less than 2 seconds.   Neurological:      General: No focal deficit present.      Mental Status: He is alert.   Psychiatric:         Mood and Affect: Mood normal.     - severely jaundice - no liver flap -- ascites improving - not tense, soft

## 2019-09-14 NOTE — Unmapped (Signed)
Clinical Social Worker Telephone Note    Name:[name] Gross  Date of Birth:August 03, 2001  MWU:[id number]    REFERRAL INFORMATION:  GANON DEMASI is in evaluation for re- liver transplantation . CSW returned call to his [name]/Alice at [phone].      IMPRESSION: Reports that she is feeling somewhat overwhelmed at his 4x insulin requirements and blood checks; medication administering, and cooking. She reports that he is unable to do it himself because of his hand tremors that Dr. Melrose Nakayama addressed at an appt on 11/3. Because she cared for her husband before her death, she wanted to know if there were an CNA type services that his insurance would pay for to come out and assist with his insulin checks and administer the insulin as well. Informed that his insurance would not cover this service but discussed private duty instead which she is familiar with as she hired them to assist when her husband was dying. She validated that it is not out of desperation that she is asking, just was inquiring. She reports that overall things seem to be going well with Timithy in her home. She expressed appreciation for discussion.        Carole Binning, LCSW-A  Transplant Case Manager  09/14/2019. 3:39 PM

## 2019-09-15 ENCOUNTER — Other Ambulatory Visit (HOSPITAL_COMMUNITY)
Admission: RE | Admit: 2019-09-15 | Discharge: 2019-09-15 | Disposition: A | Discharge status: Home / Self Care | Payer: Medicaid Other | Source: Ambulatory Visit | Attending: Pediatric Gastroenterology | Admitting: Pediatric Gastroenterology

## 2019-09-15 ENCOUNTER — Telehealth: Payer: Self-pay | Admitting: Pediatrics

## 2019-09-15 DIAGNOSIS — Z79899 Other long term (current) drug therapy: Secondary | ICD-10-CM | POA: Diagnosis not present

## 2019-09-15 DIAGNOSIS — Z944 Liver transplant status: Secondary | ICD-10-CM | POA: Insufficient documentation

## 2019-09-15 LAB — COMPREHENSIVE METABOLIC PANEL
ALT: 336 U/L — ABNORMAL HIGH (ref 0–44)
AST: 256 U/L — ABNORMAL HIGH (ref 15–41)
Albumin: 2.2 g/dL — ABNORMAL LOW (ref 3.5–5.0)
Alkaline Phosphatase: 1004 U/L — ABNORMAL HIGH (ref 38–126)
Anion gap: 11 (ref 5–15)
BUN: 24 mg/dL — ABNORMAL HIGH (ref 6–20)
CO2: 19 mmol/L — ABNORMAL LOW (ref 22–32)
Calcium: 8.8 mg/dL — ABNORMAL LOW (ref 8.9–10.3)
Chloride: 106 mmol/L (ref 98–111)
Creatinine, Ser: 1.81 mg/dL — ABNORMAL HIGH (ref 0.61–1.24)
GFR calc Af Amer: >60 mL/min (ref >60)
GFR calc non Af Amer: 53 mL/min — ABNORMAL LOW (ref >60)
Glucose, Bld: 209 mg/dL — ABNORMAL HIGH (ref 70–99)
Potassium: 4 mmol/L (ref 3.5–5.1)
Sodium: 136 mmol/L (ref 135–145)
Total Bilirubin: 43.1 mg/dL (ref 0.3–1.2)
Total Protein: 5.8 g/dL — ABNORMAL LOW (ref 6.5–8.1)

## 2019-09-15 LAB — CBC WITH DIFFERENTIAL/PLATELET
Abs Immature Granulocytes: 0.1 10*3/uL — ABNORMAL HIGH (ref 0.00–0.07)
Basophils Absolute: 0 10*3/uL (ref 0.0–0.1)
Basophils Relative: 0 %
Eosinophils Absolute: 0 10*3/uL (ref 0.0–0.5)
Eosinophils Relative: 0 %
HCT: 26.8 % — ABNORMAL LOW (ref 39.0–52.0)
Hemoglobin: 9.4 g/dL — ABNORMAL LOW (ref 13.0–17.0)
Lymphocytes Relative: 4 %
Lymphs Abs: 0.2 10*3/uL — ABNORMAL LOW (ref 0.7–4.0)
MCH: 29.7 pg (ref 26.0–34.0)
MCHC: 35.1 g/dL (ref 30.0–36.0)
MCV: 84.5 fL (ref 80.0–100.0)
Monocytes Absolute: 0.6 10*3/uL (ref 0.1–1.0)
Monocytes Relative: 10 %
Neutro Abs: 5.3 10*3/uL (ref 1.7–7.7)
Neutrophils Relative %: 85 %
Platelets: 558 10*3/uL — ABNORMAL HIGH (ref 150–400)
Promyelocytes Relative: 1 %
RBC: 3.17 MIL/uL — ABNORMAL LOW (ref 4.22–5.81)
RDW: 27.9 % — ABNORMAL HIGH (ref 11.5–15.5)
WBC: 6.2 10*3/uL (ref 4.0–10.5)
nRBC: 0 /100 WBC
nRBC: 0.3 % — ABNORMAL HIGH (ref 0.0–0.2)

## 2019-09-15 LAB — MAGNESIUM: Magnesium: 1.4 mg/dL — ABNORMAL LOW (ref 1.7–2.4)

## 2019-09-15 LAB — PHOSPHORUS: Phosphorus: 4.7 mg/dL — ABNORMAL HIGH (ref 2.5–4.6)

## 2019-09-15 LAB — GAMMA GT: GGT: 543 U/L — ABNORMAL HIGH (ref 7–50)

## 2019-09-15 LAB — PROTIME-INR
INR: 1.4 — ABNORMAL HIGH (ref 0.8–1.2)
Prothrombin Time: 16.9 seconds — ABNORMAL HIGH (ref 11.4–15.2)

## 2019-09-15 NOTE — Telephone Encounter (Signed)
Called and notified of labs drawn on Endy today- labs ordered by [facility], but the lab could not figure out who to call at [facility]- I called [facility] And spoke to the on call fellow to report lab results.  Fellow reported to me that he will discuss results with Dr. Rockne Coons of [facility] and take action as clinically indicated and follow up with the family per Dr. Rockne Coons. Murlean Hark MD

## 2019-09-16 ENCOUNTER — Encounter
Admit: 2019-09-16 | Discharge: 2019-09-16 | Disposition: A | Discharge status: Home / Self Care | Payer: PRIVATE HEALTH INSURANCE | Attending: Emergency Medicine

## 2019-09-16 LAB — COMPREHENSIVE METABOLIC PANEL
ALKALINE PHOSPHATASE: 1004 U/L — ABNORMAL HIGH
ALT (SGPT): 336 U/L — ABNORMAL HIGH
AST (SGOT): 256 U/L — ABNORMAL HIGH
BILIRUBIN TOTAL: 43.1 mg/dL
CALCIUM: 8.8 mg/dL — ABNORMAL LOW
CHLORIDE: 106 mmol/L
CO2: 19 mmol/L — ABNORMAL LOW
CREATININE: 1.81 mg/dL — ABNORMAL HIGH
EGFR CKD-EPI AA MALE: 53 mL/min/{1.73_m2} — ABNORMAL LOW
GLUCOSE RANDOM: 209 mg/dL — ABNORMAL HIGH
POTASSIUM: 4 mmol/L
PROTEIN TOTAL: 5.8 g/dL — ABNORMAL LOW
SODIUM: 136 mmol/L

## 2019-09-16 LAB — CBC W/ DIFFERENTIAL
BASOPHILS ABSOLUTE COUNT: 0 10*9/L
EOSINOPHILS ABSOLUTE COUNT: 0 10*9/L
HEMATOCRIT: 26.8 % — ABNORMAL LOW
HEMOGLOBIN: 9.4 g/dL — ABNORMAL LOW
LYMPHOCYTES ABSOLUTE COUNT: 0.2 10*9/L — ABNORMAL LOW
MONOCYTES ABSOLUTE COUNT: 0.6 10*9/L
PLATELET COUNT: 558 10*9/L — ABNORMAL HIGH

## 2019-09-16 LAB — PROTIME: Lab: 16.9 — ABNORMAL HIGH

## 2019-09-16 LAB — ALBUMIN
ALBUMIN: 2.2 g/dL — ABNORMAL LOW
Lab: 2.2 — ABNORMAL LOW

## 2019-09-16 LAB — PHOSPHORUS: Lab: 4.7 — ABNORMAL HIGH

## 2019-09-16 LAB — GAMMA GLUTAMYL TRANSFERASE: Lab: 543 — ABNORMAL HIGH

## 2019-09-16 LAB — EGFR CKD-EPI NON-AA MALE: Lab: 0

## 2019-09-16 LAB — LYMPHOCYTES ABSOLUTE COUNT: Lab: 0.2 — ABNORMAL LOW

## 2019-09-16 LAB — MAGNESIUM: Lab: 1.4 — ABNORMAL LOW

## 2019-09-16 NOTE — Unmapped (Signed)
[facility] Emergency Department Provider Note      ED Clinical Impression     Final diagnoses:   AKI (acute kidney injury) (CMS-HCC) (Primary)   Dehydration   Liver transplant rejection (CMS-HCC)   Left against medical advice       Initial Impression, ED Course, Assessment and Plan     Patient is a 18 y.o. male with a PMH of acute rejection of liver transplant, adjustment disorder with anxiety, depression, MSSA bacteremia, seasonal allergies, sickle cell trait sleep difficulties and homeless (08/04/2019 presenting to the ED as directed due to elevated creatinine level.    I do note CMP 09/13/2019 with creatinine 1.59 and BUN of 25 which is elevated from previous values.    ~11:35 PM  Was called to patient's bedside by nursing staff who state patient wants to sign out AMA.  He refuses blood draw.  Mother at bedside.  I note that patient makes his own medical decisions.  States he is tired and does not want blood drawn/refuses.  Patient states he understands that blood work requested to evaluate kidney function.  Also treatment for elevated creatinine.  Understands risks of leaving include kidney failure, disability and death.  Signed AMA form.  Encouraged to return for ongoing care.    Mother, at bedside notes that skin color changes and sclera icteric unchanged from over past few weeks.     Labs     Labs Reviewed   COMPREHENSIVE METABOLIC PANEL   MAGNESIUM   LACTATE, VENOUS, WHOLE BLOOD   CBC W/ DIFFERENTIAL    Narrative:     The following orders were created for panel order CBC w/ Differential.                  Procedure                               Abnormality         Status                                     ---------                               -----------         ------                                     CBC w/ [id number]                                                                                          Please view results for these tests on the individual orders.   URINALYSIS WITH CULTURE REFLEX   CBC W/ AUTO DIFF        Were ordered and patient refuses.      History     Chief Complaint  Abnormal Lab  HPI   Patient was seen by me at 10:15 PM.    Patient is a 18 y.o. male with a PMH of acute rejection of liver transplant, adjustment disorder with anxiety, depression, MSSA bacteremia, seasonal allergies, sickle cell trait sleep difficulties and homeless (08/04/2019 presenting to the ED as directed due to elevated creatinine level.    Patient presents with mother with whom he resides.  States was called to come to the emergency department for evaluation due to elevated creatinine.  Reports generally doing well since discharge from hospital but notes ongoing resting tremor.  Mother states that this seems a little worse since a bit of med adjustment while hospitalized.    Patient states that he has been drinking plenty of fluid including green tea and water today.    States has been tested for COVID-19 and negative and denies any red flag COVID-19 symptoms or known sick contacts.    Previous chart, nursing notes, and vital signs reviewed.  Including fairly recent liver rejection.    Pertinent labs & imaging results that were available during my care of the patient were reviewed by me and considered in my medical decision making (see chart for details).    Past Medical History:   Diagnosis Date   ??? 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 Depression    ??? MSSA bacteremia 08/27/2019   ??? Seasonal allergies    ??? Sickle cell trait (CMS-HCC)    ??? Strep Mitis Central line-associated bloodstream infection 08/27/2019       Past Surgical History:   Procedure Laterality Date   ??? FRENULECTOMY, LINGUAL     ??? PR ERCP REMOVE FOREIGN BODY/STENT BILIARY/PANC DUCT N/A 08/20/2017    Procedure: ENDOSCOPIC RETROGRADE CHOLANGIOPANCREATOGRAPHY (ERCP); W/ REMOVAL OF FOREIGN BODY/STENT FROM BILIARY/PANCREATIC DUCT(S);  Surgeon: Vonda Antigua, MD;  Location: GI PROCEDURES MEMORIAL [facility];  Service: Gastroenterology   ??? PR ERCP REMOVE FOREIGN BODY/STENT BILIARY/PANC DUCT N/A 06/04/2018    Procedure: ENDOSCOPIC RETROGRADE CHOLANGIOPANCREATOGRAPHY (ERCP); W/ REMOVAL OF FOREIGN BODY/STENT FROM BILIARY/PANCREATIC DUCT(S);  Surgeon: Chriss Driver, MD;  Location: GI PROCEDURES MEMORIAL [facility];  Service: Gastroenterology   ??? PR ERCP STENT PLACEMENT BILIARY/PANCREATIC DUCT N/A 11/21/2016    Procedure: ENDOSCOPIC RETROGRADE CHOLANGIOPANCREATOGRAPHY (ERCP); WITH PLACEMENT OF ENDOSCOPIC STENT INTO BILIARY OR PANCREATIC DUCT;  Surgeon: Vonda Antigua, MD;  Location: GI PROCEDURES MEMORIAL [facility];  Service: Gastroenterology   ??? PR ERCP,W/REMOVAL STONE,BIL/PANCR DUCTS N/A 08/20/2017    Procedure: ERCP; W/ENDOSCOPIC RETROGRADE REMOVAL OF CALCULUS/CALCULI FROM BILIARY &/OR PANCREATIC DUCTS;  Surgeon: Vonda Antigua, MD;  Location: GI PROCEDURES MEMORIAL [facility];  Service: Gastroenterology   ??? PR ERCP,W/REMOVAL STONE,BIL/PANCR DUCTS N/A 06/04/2018    Procedure: ERCP; W/ENDOSCOPIC RETROGRADE REMOVAL OF CALCULUS/CALCULI FROM BILIARY &/OR PANCREATIC DUCTS;  Surgeon: Chriss Driver, MD;  Location: GI PROCEDURES MEMORIAL [facility];  Service: Gastroenterology   ??? PR TRANSPLANT LIVER,ALLOTRANSPLANT N/A 09/22/2016    Procedure: LIVER ALLOTRANSPLANTATION; ORTHOTOPIC, PARTIAL OR WHOLE, FROM CADAVER OR LIVING DONOR, ANY AGE;  Surgeon: Doyce Loose, MD;  Location: MAIN OR [facility];  Service: Transplant   ??? PR TRANSPLANT,PREP DONOR LIVER, WHOLE N/A 09/22/2016    Procedure: [facility] STD PREP CAD DONOR WHOLE LIVER GFT PRIOR TNSPLNT,INC CHOLE,DISS/REM SURR TISSU WO TRISEG/LOBE SPLT;  Surgeon: Doyce Loose, MD;  Location: MAIN OR [facility];  Service: Transplant   ???  PR UPGI ENDOSCOPY W/US FN BX N/A 08/20/2017    Procedure: UGI W/ TRANSENDOSCOPIC ULTRASOUND GUIDED INTRAMURAL/TRANSMURAL FINE NEEDLE ASPIRATION/BIOPSY(S), ESOPHAGUS;  Surgeon: Vonda Antigua, MD;  Location: GI PROCEDURES MEMORIAL [facility];  Service: Gastroenterology   ??? PR UPPER GI ENDOSCOPY,BIOPSY N/A 07/15/2016    Procedure: UGI ENDOSCOPY; WITH BIOPSY, SINGLE OR MULTIPLE;  Surgeon: Arnold Long Mir, MD;  Location: PEDS PROCEDURE ROOM [facility];  Service: Gastroenterology   ??? PR UPPER GI ENDOSCOPY,CTRL BLEED Left 05/05/2016    Procedure: UGI ENDOSCOPY; WITH CONTROL OF BLEEDING, ANY METHOD;  Surgeon: Arnold Long Mir, MD;  Location: PEDS PROCEDURE ROOM [facility];  Service: Gastroenterology   ??? PR UPPER GI ENDOSCOPY,CTRL BLEED N/A 05/12/2016    Procedure: UGI ENDOSCOPY; WITH CONTROL OF BLEEDING, ANY METHOD;  Surgeon: Annie Paras, MD;  Location: CHILDRENS OR [facility];  Service: Gastroenterology         Current Facility-Administered Medications:   ???  albuterol (PROVENTIL HFA;VENTOLIN HFA) 90 mcg/actuation inhaler 2-4 puff, 2-4 puff, Inhalation, Once, Glorianne Manchester, MD    Current Outpatient Medications:   ???  blood sugar diagnostic Strp, Use to check blood sugar four times daily as directed., Disp: 100 strip, Rfl: 12  ???  blood-glucose meter kit, Use as instructed, Disp: 1 each, Rfl: 0  ???  calcium carbonate (OS-CAL) 1,500 mg (600 mg elem calcium) tablet, Take 1/2 tablet (300 mg elem calcium) by mouth Two (2) times a day. (Over the counter med), Disp: 60 tablet, Rfl: 0  ???  cholecalciferol, vitamin D3, 125 mcg (5,000 unit) tablet, Take 1 tablet (5,000 Units total) by mouth daily., Disp: 30 tablet, Rfl: 11  ???  clindamycin-benzoyl peroxide 1.2 %(1 % base) -5 % gel, Apply to rash on chest, Disp: 45 g, Rfl: 0  ???  glucagon 0.5 mg/0.1 mL Syrg, Inject 0.1 mL under the skin once as needed for up to 1 dose., Disp: 0.2 mL, Rfl: 2  ???  glucose 4 GM chewable tablet, Chew 1 tablet (4 g total) 4 (four) times a day as needed for low blood sugar (refer to hypoglycemia guideline)., Disp: 50 tablet, Rfl: 12  ???  insulin glargine (LANTUS) 100 unit/mL injection, Inject 0.1 mL (10 Units total) under the skin nightly., Disp: 10 mL, Rfl: 0  ???  insulin lispro (HUMALOG) 100 unit/mL injection, Inject 0.06 mL (6 Units total) under the skin Five (5) times a day and Sliding scale 0 to 10 units under the skin five times a day as directed., Disp: 30 mL, Rfl: 0  ???  insulin syr/ndl U100 half mark (BD INSULIN SYRINGE HALF UNIT) 0.3 mL 31 gauge x 5/16 Syrg, Use to inject insulins up to 6 times daily as directed., Disp: 200 each, Rfl: 12  ???  ketoconazole (NIZORAL) 2 % shampoo, Apply topically daily as directed., Disp: 120 mL, Rfl: 0  ???  lancets Misc, Use to check blood glucose 6 times per day., Disp: 204 each, Rfl: 0  ???  magnesium oxide (MAG-OX) 400 mg (241.3 mg magnesium) tablet, Take 1 tablet (400 mg total) by mouth daily., Disp: 30 tablet, Rfl: 3  ???  melatonin 3 mg Tab, Take 1 tablet (3 mg total) by mouth every evening. (Patient not taking: Reported on 09/13/2019), Disp: 60 tablet, Rfl: 0  ???  multivitamin, with zinc (AQUADEKS) 100-5 mcg-mg Chew, Chew 2 tablets daily., Disp: 60 tablet, Rfl: 0  ???  mupirocin (BACTROBAN) 2 % ointment, Apply topically Three (3) times a day for 7 days., Disp: 22 g, Rfl:  0  ???  mycophenolate (MYFORTIC) 180 MG EC tablet, Take 3 tablets (540 mg total) by mouth Two (2) times a day., Disp: 180 tablet, Rfl: 11  ???  nystatin (MYCOSTATIN) 100,000 unit/mL suspension, Take 10 mls (1,000,000 Units total) by mouth Three (3) times a day., Disp: 900 mL, Rfl: 0  ???  pantoprazole (PROTONIX) 40 MG tablet, Take 1 tablet (40 mg total) by mouth daily., Disp: 30 tablet, Rfl: 0  ???  polyethylene glycol (MIRALAX) 17 gram packet, Take 17 g (1 packet dissolved in 4 to 8 oz. of liquid) by mouth two (2) times a day as needed (constipation)., Disp: 100 each, Rfl: 0  ???  potassium & sodium phosphates 250mg  (PHOS-NAK/NEUTRA PHOS) 280-160-250 mg PwPk, Take 2 packets by mouth Three (3) times a day., Disp: 180 packet, Rfl: 0  ???  predniSONE (DELTASONE) 20 MG tablet, Take 2 tablets (40 mg total) by mouth daily., Disp: 60 tablet, Rfl: 0  ???  rifAXIMin (XIFAXAN) 550 mg Tab, Take 1 tablet (550 mg total) by mouth Two (2) times a day., Disp: 60 tablet, Rfl: 0  ???  sulfamethoxazole-trimethoprim (BACTRIM) 400-80 mg per tablet, Take 1 tablet (80 mg of trimethoprim total) by mouth Every Monday, Wednesday, and Friday., Disp: 12 tablet, Rfl: 0  ???  tacrolimus (PROGRAF) 1 MG capsule, Take 4 capsules (4 mg) by mouth two (2) times a day., Disp: 240 capsule, Rfl: 11  ???  valGANciclovir (VALCYTE) 450 mg tablet, Take 1 tablet (450 mg total) by mouth daily., Disp: 30 tablet, Rfl: 0  ???  venlafaxine (EFFEXOR-XR) 37.5 MG 24 hr capsule, Take 1 capsule (37.5 mg total) by mouth daily., Disp: 30 capsule, Rfl: 0    Allergies  Bee pollen and Pollen extracts    Family History   Problem Relation Age of Onset   ??? Diabetes Maternal Grandmother    ??? Diabetes Paternal Grandmother    ??? Hypertension Maternal Grandfather    ??? Hypertension Paternal Grandfather    ??? Asthma Brother    ??? Anesthesia problems Neg Hx        Social History  Social History     Tobacco Use   ??? Smoking status: Never Smoker   ??? Smokeless tobacco: Never Used   Substance Use Topics   ??? Alcohol use: Never     Alcohol/week: 0.0 standard drinks     Frequency: Never     Comment: 1/2 glass of wine on rare occasions   ??? Drug use: Never       Review of Systems  Constitutional: Negative for fever.  Eyes: Negative for visual changes.  ENT: Negative for sore throat.  Cardiovascular: Negative for chest pain.  Respiratory: Negative for shortness of breath.  Gastrointestinal: Negative for abdominal pain, vomiting or diarrhea.  Genitourinary: Negative for dysuria.   Musculoskeletal: Negative for back pain.  Skin: Negative for rash.  Neurological: Negative for headaches, focal weakness or numbness.  States not feeling depressed or anxious.  Denies SI/HI.      Physical Exam     VITAL SIGNS:    Vitals:    09/15/19 2141 09/16/19 0004   BP: 113/69 122/68   Pulse: 95 86   Resp: 18 18   Temp: 37 ??C (98.6 ??F)    TempSrc: Oral    SpO2: 100% 100%       Constitutional: Alert and oriented.  Appears chronically ill but not acutely ill and in no distress.  Patient appears fatigued with significant sclera icteric and a  yellowish hue of skin/jaundice.  Mother at bedside.  Patient does appear fatigued.  Eyes: Conjunctivae significant for icteric. PERRLA. EOMs intact.   ENT       Head: Normocephalic and atraumatic.       Ear: EACs without exudate or erythema.        Nose: No congestion. No epistaxis       Mouth/Throat: Mucous membranes are moist without lesions/ulcerations. Posterior oropharynx patent. Tonsils without erythema or exudate. Uvula is midline. Soft palate is soft without induration.        Neck: No stridor. Full ROM without pain. No spinous process tenderness. No nuchal rigidity.    Cardiovascular: Normal rate, regular rhythm. Normal and symmetric distal pulses are present in all extremities. No gallops, murmurs, or clicks.  Respiratory: Normal respiratory effort. Breath sounds are normal.  Gastrointestinal: Soft with normoactive bowel sounds in all 4 quadrants.  I do not appreciate any significant tenderness to palpation.No guarding or rigidity.  No CVA tenderness.    Musculoskeletal: Nontender joints and muscles with normal range of motion in all extremities.  Neurologic: Normal speech and language. Cranial nerves II-XII intact. Normal tandem gait.  Skin: Skin is warm, dry and intact. No rash noted. No pallor.  Skin is icteric/yellowish hue.  Psychiatric: Mood and affect are normal. Speech and behavior are normal.          Modesta Messing, PA  09/16/19 0009

## 2019-09-16 NOTE — Unmapped (Signed)
Patient states he is a liver transplant patient and his creatinine levels are elevated.  Was recently discharged after a month in the hospital.

## 2019-09-16 NOTE — Unmapped (Signed)
I attempted to reach [name] Gross by phone, no answer.  I did not leave a voice mail. I called his grandmother [name] Gross and spoke with her.  I asked for clariication on his current living situation since he was with his mother [name] Gross at the hospital last night.  Per [name] Gross (paternal grandmother), he stayed last night at his maternal grandparent's home, but he is retuning to [name]'s home tomorrow.  I informed her of his newly scheduled appointments on Tuesday 09/20/19 starting with registration at 08:30.  [name] said that, following discussion with Dr. Melrose Gross earlier today, the family plan was to bring him to [facility] ED on Monday AM for evaluation and treatment.  If this plan changes, she stated that they will bring him to the appointments on Tuesday.  I agreed with this plan, and emphasized that if he is feeling or doing poorly, they can bring him to ED over the weekend.

## 2019-09-16 NOTE — Unmapped (Signed)
Pt's mother [name] Gross paged me, she is at his bedside in ED, reported that he wants to leave AMA.  I spoke with her and with [name] Gross directly and stated that I thought leaving AMA was a very bad idea.  I told him that this would show non-compliance and could potentially rule him out as a candidate for liver re-transplant.  I spoke to his ER nurse to clarify the situation and was informed that he has refused IV access and labs.  I contacted the on-call pediatric GI fellow, Dr. Jon Billings, who had called the patient in to ED earlier based on his labs (done 09/15/19 at [facility], results are in Care Everywhere, show Cr 1.81, T. Bili 43.1, MELD score 30) and discussion with Dr. Melrose Nakayama.  Dr. Jon Billings stated that he will discuss him further with Dr. Melrose Nakayama in the morning.  I called and spoke to [name] Gross again.  I told him that if he were to leave now AMA, we would likely call him to return to the hospital in the morning and again stated that the transplant team would likely consider this very negatively towards his re-transplant candidacy.  I stated that he is going to get sicker and will not be able to live and pursue his goals without re-transplant.  [name] Gross did not appear to have altered mental status and indicated that he understood what I was saying, although he was very brief in his responses.  I asked him to consider all that we spoke about for 5-10 minutes before making a final decision about leaving AMA or remaining for treatment.    I spoke again to his ER RN to review the above; she agreed to follow up with the patient after 10 minutes.

## 2019-09-16 NOTE — Unmapped (Signed)
Clinical Social Worker Telephone Note    Name:[name] Gross  Date of Birth:05-19-2001  ZOX:[id number]    REFERRAL INFORMATION:  [name] is post-transplant for liver transplantation . CSW follows up to assess post-transplant support planning.    IMPRESSION: Patient is being worked up for re-liver transplant consideration. He reports that he is feeling so much better since being discharged.     He identifies his grandfather/[name] as his back-up caregiver who was also present during this call. Commended [name] for doing his part in talking about caregiving.    He reports that he got a new phone and lost all of his contact which he is bummed about.        Carole Binning, LCSW-A  Transplant Case Manager  09/15/2019. 4:09 PM

## 2019-09-16 NOTE — Unmapped (Signed)
Patient was discharged last week with refills on all meds. Setting up clinical/onboarding call for 2 weeks out to fill from [facility] for patient.

## 2019-09-16 NOTE — Unmapped (Signed)
ED Progress Note    I do note there was a conversation with patient around being noncompliant and working with transplant team.  There is a full discussion surrounding this with patient and mom prior to him signing AMA form.

## 2019-09-16 NOTE — Unmapped (Signed)
Call received from pt's mother, [name] Gross.  [name] Gross came on the phone briefly and gave permission to speak with his mother, [name] Gross, about his health care.  They are together in our ED now.  Per [name] Gross, he received a call tonight instructing him to come to ED based on his lab results, drawn today at [facility], showing worsening Cr.  I informed her that I was not updated on this plan of care yet but was glad that he was being seen now.  Discussed possibility of admission.  They were unclear if he would be cared for adult or pediatric services at this time.  I advised that they do their best to stay comfortable while the treating team develops a plan of care.      We discussed generally his prospects for re-transplant and what avenues of compliance the transplant team would like to see.

## 2019-09-16 NOTE — Unmapped (Signed)
This RN was contacted by liver transplant coordinator regarding pt's decision to leave AMA. The coordinator informed this RN that he just attempted to convince the pt to stay and get care; coordinator was unsuccessful. Coordinator informed this RN he was going to contact pt's GI doctor and see if the provider could convince pt to stay.

## 2019-09-17 LAB — TACROLIMUS LEVEL: Tacrolimus (FK506) - LabCorp: 12.5 ng/mL (ref 2.0–20.0)

## 2019-09-17 NOTE — Unmapped (Signed)
I spoke to GM, mother and Brae -   I explained our concern about the kidney tests and likely dehydration - IV fluids would be the best way to correct this - I mentioned possible electrolyte problems including potassium that could lead to arrhythmia and even death - he hears this but still refuses to come to the hospital - mom says she cannot force him - he plans to come in 3 days (Nov 9) - says he is drinking a lot - he has lowered tacro to 3 mg bid (was 5 mg bid)  - if anything seems to change I asked them to come to [facility] ER

## 2019-09-18 ENCOUNTER — Encounter
Admit: 2019-09-18 | Discharge: 2019-09-24 | Disposition: A | Discharge status: Home / Self Care | Payer: PRIVATE HEALTH INSURANCE | Attending: Certified Registered" | Admitting: Student in an Organized Health Care Education/Training Program

## 2019-09-18 ENCOUNTER — Ambulatory Visit
Admit: 2019-09-18 | Discharge: 2019-09-24 | Disposition: A | Discharge status: Home / Self Care | Payer: PRIVATE HEALTH INSURANCE | Admitting: Student in an Organized Health Care Education/Training Program

## 2019-09-18 LAB — CBC W/ AUTO DIFF
BASOPHILS ABSOLUTE COUNT: 0.1 10*9/L (ref 0.0–0.1)
BASOPHILS RELATIVE PERCENT: 1 %
EOSINOPHILS ABSOLUTE COUNT: 0 10*9/L (ref 0.0–0.4)
EOSINOPHILS RELATIVE PERCENT: 0.1 %
HEMATOCRIT: 29.5 % — ABNORMAL LOW (ref 41.0–53.0)
HEMOGLOBIN: 9.7 g/dL — ABNORMAL LOW (ref 13.5–17.5)
LYMPHOCYTES ABSOLUTE COUNT: 0.2 10*9/L — ABNORMAL LOW (ref 1.5–5.0)
LYMPHOCYTES RELATIVE PERCENT: 3 %
MEAN CORPUSCULAR HEMOGLOBIN CONC: 33 g/dL (ref 31.0–37.0)
MEAN CORPUSCULAR VOLUME: 93.3 fL (ref 80.0–100.0)
MEAN PLATELET VOLUME: 8.7 fL (ref 7.0–10.0)
MONOCYTES ABSOLUTE COUNT: 0.3 10*9/L (ref 0.2–0.8)
MONOCYTES RELATIVE PERCENT: 5.3 %
NEUTROPHILS ABSOLUTE COUNT: 5.8 10*9/L (ref 2.0–7.5)
NEUTROPHILS RELATIVE PERCENT: 89.4 %
PLATELET COUNT: 467 10*9/L — ABNORMAL HIGH (ref 150–440)
RED BLOOD CELL COUNT: 3.16 10*12/L — ABNORMAL LOW (ref 4.50–5.90)
RED CELL DISTRIBUTION WIDTH: 26.3 % — ABNORMAL HIGH (ref 12.0–15.0)
WBC ADJUSTED: 6.5 10*9/L (ref 4.5–11.0)

## 2019-09-18 LAB — COMPREHENSIVE METABOLIC PANEL
ALBUMIN: 2.4 g/dL — ABNORMAL LOW (ref 3.5–5.0)
ALKALINE PHOSPHATASE: 1021 U/L — ABNORMAL HIGH (ref 65–260)
BILIRUBIN TOTAL: 31.5 mg/dL — ABNORMAL HIGH (ref 0.0–1.2)
BLOOD UREA NITROGEN: 24 mg/dL — ABNORMAL HIGH (ref 7–21)
BLOOD UREA NITROGEN: 24 mg/dL — ABNORMAL HIGH (ref 7–21)
BUN / CREAT RATIO: 16
BUN / CREAT RATIO: 19
CALCIUM: 8.3 mg/dL — ABNORMAL LOW (ref 8.5–10.2)
CALCIUM: 8 mg/dL — ABNORMAL LOW (ref 8.5–10.2)
CHLORIDE: 108 mmol/L — ABNORMAL HIGH (ref 98–107)
CHLORIDE: 110 mmol/L — ABNORMAL HIGH (ref 98–107)
CREATININE: 1.53 mg/dL — ABNORMAL HIGH (ref 0.70–1.30)
EGFR CKD-EPI AA MALE: 75 mL/min/{1.73_m2} (ref >=60)
EGFR CKD-EPI AA MALE: >90 mL/min/{1.73_m2} (ref >=60)
EGFR CKD-EPI NON-AA MALE: 81 mL/min/{1.73_m2} (ref >=60)
GLUCOSE RANDOM: 102 mg/dL (ref 70–179)
GLUCOSE RANDOM: 191 mg/dL — ABNORMAL HIGH (ref 70–179)
POTASSIUM: 3.6 mmol/L (ref 3.5–5.0)
SODIUM: 134 mmol/L — ABNORMAL LOW (ref 135–145)
SODIUM: 133 mmol/L — ABNORMAL LOW (ref 135–145)

## 2019-09-18 LAB — URINALYSIS
BACTERIA: NONE SEEN /HPF
BLOOD UA: NEGATIVE
GLUCOSE UA: NEGATIVE
KETONES UA: NEGATIVE
LEUKOCYTE ESTERASE UA: NEGATIVE
NITRITE UA: NEGATIVE
PH UA: 6 (ref 5.0–9.0)
PROTEIN UA: NEGATIVE
RBC UA: 1 /HPF (ref <=3)
SPECIFIC GRAVITY UA: 1.008 (ref 1.003–1.030)
SQUAMOUS EPITHELIAL: <1 /HPF (ref 0–5)
UROBILINOGEN UA: 2 — AB
WBC UA: 1 /HPF (ref <=2)

## 2019-09-18 LAB — LACTATE BLOOD VENOUS: Lactate:SCnc:Pt:BldV:Qn:: 0.9

## 2019-09-18 LAB — PHOSPHORUS: Phosphate:MCnc:Pt:Ser/Plas:Qn:: 3.6

## 2019-09-18 LAB — PROTIME: Coagulation tissue factor induced:Time:Pt:PPP:Qn:Coag: 16.9 — ABNORMAL HIGH

## 2019-09-18 LAB — BLOOD GAS CRITICAL CARE PANEL, VENOUS
CALCIUM IONIZED VENOUS (MG/DL): 4.67 mg/dL (ref 4.40–5.40)
GLUCOSE WHOLE BLOOD: 123 mg/dL (ref 70–179)
HCO3 VENOUS: 19 mmol/L — ABNORMAL LOW (ref 22–27)
HEMOGLOBIN BLOOD GAS: 8 g/dL — ABNORMAL LOW (ref 13.50–17.50)
O2 SATURATION VENOUS: 98.7 % — ABNORMAL HIGH (ref 40.0–85.0)
PCO2 VENOUS: 35 mmHg — ABNORMAL LOW (ref 40–60)
PO2 VENOUS: 111 mmHg — ABNORMAL HIGH (ref 30–55)
POTASSIUM WHOLE BLOOD: 4.1 mmol/L (ref 3.4–4.6)
SODIUM WHOLE BLOOD: 138 mmol/L (ref 135–145)

## 2019-09-18 LAB — BLOOD GAS, VENOUS
BASE EXCESS VENOUS: -2.6 — ABNORMAL LOW (ref -2.0–2.0)
HCO3 VENOUS: 22 mmol/L (ref 22–27)
PH VENOUS: 7.37 (ref 7.32–7.43)
PO2 VENOUS: 45 mmHg (ref 30–55)

## 2019-09-18 LAB — MEAN CORPUSCULAR VOLUME: Lab: 93.3

## 2019-09-18 LAB — AMMONIA: Ammonia:SCnc:Pt:Plas:Qn:: 36 — ABNORMAL HIGH

## 2019-09-18 LAB — FACTOR V ACTIVITY: Lab: 82

## 2019-09-18 LAB — SLIDE REVIEW

## 2019-09-18 LAB — TACROLIMUS, TROUGH
Lab: 3.9 — ABNORMAL LOW
Lab: 6.4

## 2019-09-18 LAB — APTT: Coagulation surface induced:Time:Pt:PPP:Qn:Coag: 30.2

## 2019-09-18 LAB — AST (SGOT): Aspartate aminotransferase:CCnc:Pt:Ser/Plas:Qn:: 208 — ABNORMAL HIGH

## 2019-09-18 LAB — BASE EXCESS VENOUS: Base excess:SCnc:Pt:BldV:Qn:Calculated: -2.6 — ABNORMAL LOW

## 2019-09-18 LAB — BILIRUBIN DIRECT: Bilirubin.glucuronidated+Bilirubin.albumin bound:MCnc:Pt:Ser/Plas:Qn:: 28.1 — ABNORMAL HIGH

## 2019-09-18 LAB — PROTEIN TOTAL: Protein:MCnc:Pt:Ser/Plas:Qn:: 6 — ABNORMAL LOW

## 2019-09-18 LAB — FACTOR VII ACTIVITY: Lab: 24 — ABNORMAL LOW

## 2019-09-18 LAB — INR: Coagulation tissue factor induced.INR:RelTime:Pt:PPP:Qn:Coag: 1.63

## 2019-09-18 LAB — BILIRUBIN TOTAL: Bilirubin:MCnc:Pt:Ser/Plas:Qn:: 25.3 — ABNORMAL HIGH

## 2019-09-18 LAB — MAGNESIUM: Magnesium:MCnc:Pt:Ser/Plas:Qn:: 1 — ABNORMAL LOW

## 2019-09-18 LAB — GAMMA GLUTAMYL TRANSFERASE: Gamma glutamyl transferase:CCnc:Pt:Ser/Plas:Qn:: 504 — ABNORMAL HIGH

## 2019-09-18 LAB — SODIUM WHOLE BLOOD: Sodium:SCnc:Pt:Bld:Qn:: 138

## 2019-09-18 LAB — OSMOLALITY URINE: Lab: 349

## 2019-09-18 LAB — PROTIME-INR
PROTIME: 18.9 s — ABNORMAL HIGH (ref 10.2–13.1)
PROTIME: 16.9 s — ABNORMAL HIGH (ref 10.2–13.1)

## 2019-09-18 LAB — SMEAR REVIEW

## 2019-09-18 LAB — BILIRUBIN UA

## 2019-09-18 LAB — ALBUMIN: Albumin:MCnc:Pt:Ser/Plas:Qn:: 2.9 — ABNORMAL LOW

## 2019-09-18 LAB — CHLORIDE: Chloride:SCnc:Pt:Ser/Plas:Qn:: 110 — ABNORMAL HIGH

## 2019-09-18 LAB — POTASSIUM: Potassium:SCnc:Pt:Ser/Plas:Qn:: 4.3

## 2019-09-18 LAB — CREATININE, URINE: Lab: 41.9

## 2019-09-18 LAB — EGFR CKD-EPI AA MALE: Lab: 75

## 2019-09-18 LAB — ALKALINE PHOSPHATASE: Alkaline phosphatase:CCnc:Pt:Ser/Plas:Qn:: 782 — ABNORMAL HIGH

## 2019-09-18 LAB — SODIUM URINE: Lab: 68

## 2019-09-18 NOTE — Unmapped (Signed)
Bed: 02-P  Expected date:   Expected time:   Means of arrival:   Comments:  Covid ro

## 2019-09-18 NOTE — Unmapped (Addendum)
[name] Gross is a 18 y.o. 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 He was recently hospitalized for acute rejection from 9/24-10/31 complicated by MSSA and strep mitis bacteremia s/p 14 days of IV antibiotics. He was admitted to [facility] on 09/18/2019 with fever, fatigue and altered mental status. Hospital course summarized by problem as follows:   ??  CV: No acute intervention. He remained hemodynamically stable throughout his hospital stay. Echo 11/9 with no intracardiac vegetations to suggest endocarditis as cause of fever, and otherwise normal R and L ventricular systolic function. R sided pleural effusion was identified. EKG obtained 11/13 to assist discharge antibiotic choice due to recent borderline QTc ( , Sept 2020) showed normalized QTc ( ) and was otherwise normal.     RESP: ECHO and liver U/S 11/8 showed large right-sided pleural effusion,,which was treated with albumin and Lasix. On 11/10, CXR showed trace right pleural effusion. He remained stable in room air with normal work of breathing.   ??  FEN/GI: Both pediatric and adult GI followed throughout hospital course. Initial presentation concerning for SBP given his fever, fluid on abdomen, and evidence of large volume ascite on liver U/s on 11/9, however a diagnostic paracentesis was completed and fluid studies were not consistent with SBP. 11/9 Liver U/S showed increased and heterogeneous hepatic parenchymal echogenicity consistent with chronic liver disease and mild periportal edema. Hepatic vasculature was otherwise patent. Home myfortic and prednisone were continued. Home tacrolimus was held due to concern for hand tremor inhibiting ability to administer insulin, however the adult GI team and Dr. Melrose Nakayama decided it was best to restart it. Tacrolimus was started on 11/11 at 3 mg BID and he was discharged on 4mg . Home rifaximin and lactulose were continued. He was given a low salt diet. Protonix was continued for GI ppx. Home MVI, Mg, Ca, VitD3 and NaPhos supplements were also continued. Infection ppx was continued including bactrim and valgancyclovir.    He underwent EGD on 11/11 to evaluate esophageal varices due to possible need for anticoagulation of DVT. 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  Pediatric GI (Dr. Nonnie Done) will manage all immunosuppression moving forward.     Most recent MELD score at time of discharge was:   MELD-Na score: 28 at 09/24/2019  5:00 AM  MELD score: 26 at 09/24/2019  5:00 AM  Calculated from:  Serum Creatinine: 1.40 mg/dL at 86/57/8469  6:29 AM  Serum Sodium: 133 mmol/L at 09/23/2019  8:33 AM  Total Bilirubin: 29.5 mg/dL at 52/84/1324  4:01 AM  INR(ratio): 1.38 at 09/24/2019  5:00 AM  Age: 74 years 6 months  ??  ID: History of fever at home (Tmax 103F) and recent admission for MSSA bacteremia s/p 14 day treatment (Vanc/CTX/Flagyl 10/13-10/16; Unasyn 10/16-10/27). Afebrile on presentation to [facility] and throughout this admission. Admission labs notable for normal WBC (6.5), BCx neg, UCx neg, RPP/COVID neg. HSV1/2 neg, EBV neg, CMV consistent w/ past infection. HIV, RPR, gonorrhea, chlamydia all negative. Echo obtained 11/9 and negative for endocarditis. LP was considered due to AMS, but ultimately deferred due to quick clinical improvement (see Neuro). Paracentesis performed 11/9 not consistent with SBP. Fungitell positive (11/9) in the setting of oral thrush (treated w/ Nystatin TID 11/8-***), per ID not felt to represent systemic infection. Fungal culture negative. Adult Transplant ID was consulted. He was initially treated empirically for presumed intra-abdominal infection with meropenem 11/8-11/9, transitioned to ceftriaxone/Flagyl 11/8-11/13 and ultimately  cipro/Flagyl to complete 10-day course (end 11/16). Bactrim and valgancyclovir ppx were continued. He had several loose stools during admission; GIPP and C diff sent 11/11 were negative and stools improved prior to discharge.     RENAL: Admission creatinine (1.53) improved relative to most recent value from 11/5 (1.81), but still consistent w/ AKI. Chemistries trended daiy w/ Cr nadir of 1.22 on 11/9 in setting of IVF and holding tacrolimus, but increased to 4mg  at time of discharge after tacro restarted. Abdominal ultrasound on 11/8 with nonspecific increased bilateral renal echogenicity. Patient received 25% albumin 11/8-11/9 and 40  Mg Lasix on 11/9 in the setting of ascites and pleural effusion, but did not require further diuresis.     ENDO: Adult endocrinology was consulted given known steroid-induced hyperglycemia on insulin at home. Nightly Lantus initially held in the setting of normoglycemia, but restarted on 11/9 at decreased dose of 5U nightly. Carb coverage was modified to 2-4 units with meals/snacks and was intermittently held when patient NPO. SSI 1:50>150 ACHS was not changed.     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 Heme/Onc was consulted and initially started therapeutic Lovenox (1 dose given), however ultimately decided to discontinue and hold given grade 1 esophageal varices seen on EGD, liver disease, and asymptomatic DVTs. Repeat PVL on 11/13 demonstrated stable DVTs in RUE axial and brachial veins so patient discharged without any therapeutic anticoagulation.  ??  NEURO: Takari was initially admitted to the floor, but transferred to the PICU as a rapid response was called on 11/8 due to AMS (somnolence) and asterixis concerning for hepatic encephalopathy. Initial ammonia 36. CT Head showed no acute intracranial abnormality. Rifamixin and lactulose were started due to concern for hepatic encephalopathy, and patient's mental status quickly improved. Ammonia on discharge was 32.     DERM: Dermatology was consulted for steroid-induced acne and atypical dermatitis. He was treated with topical clindamycin and benzoyl peroxide.     PSYCH: Effexor was discontinued in setting of AMS. Patient had multiple sessions of CBT with a transplant psychologist during his stay. He has a follow-up appointment scheduled with Theda Sers on 11/23.  ??  SOCIAL: Hx of significant social stressors, patient cut ties with all family members. Appears to be mending the relationship with his parents as mother was at the bedside for many of his hospitalization days. Lives primarily with paternal grandmother at this time. [name] is HCPOA per ACP documentation. ***Elva expressed interest in palliative care during admission, which Supportive Care (Dr. Jessee Avers) is planning to help coordinate via [facility] in the outpatient setting.

## 2019-09-18 NOTE — Unmapped (Signed)
Pt rounding completed. Patient is in room resting. Reviewed with patient and mother plan of care, patient belongings and call bell within reach. Patients bed in low position and locked. Will continue to monitor patient and reassess.     REASSESSMENT: Pt is resting in bed with eyes closed, TV on. No acute changes to physical assessment noted. NS and magnesium infusing at this time.

## 2019-09-18 NOTE — Unmapped (Addendum)
Pt rounding completed. Patient is in room resting. Reviewed with patient plan of care, patient belongings and call bell within reach. Patients bed in low position and locked. Will continue to monitor patient and reassess.     Pt noted to be shaking    Provider at bedside.

## 2019-09-18 NOTE — Unmapped (Signed)
Received on call page from patient's mother who reports patient has been increasingly lethargic today with temp of 101.4 - he left [facility] AMA yesterday with agreement to return to ED on Monday for continuation of workup.  Of note patient is s/p liver transplant recipient and is FOLLOWED BY [facility] GI provider Dr. Melrose Nakayama.      Mother was tearful during conversation noting per discussion with Dr. Melrose Nakayama they planned to increase hydration this weekend and report to [facility] on Monday for further evaluation of recent lab abnormalities.  Explained to mother that new onset lethargy and temp now warrant patient coming to [facility] now for urgent workup.  She was agreeable and stated she would do whatever needed to get him to the hospital as she knows it is the right thing to do.      Provided reassurance to mother during call and assured her coordinator would route notification to pediatric GI team of anticipated ED visit due to lethargy and fever.  Call placed to [facility] ED charge nurse to make aware of planned patient return to ED and establishment with [facility] Peds GI service.

## 2019-09-18 NOTE — Unmapped (Signed)
Pediatric Tacrolimus Therapeutic Monitoring Pharmacy Note    [name] Gross is a 18 y.o. male continuing tacrolimus.     Indication: Liver transplant     Date of Transplant: 10/02/2016      Prior Dosing Information: Home regimen of tacrolimus 3 mg (0.05 mg/kg) PO every 12 hours per Dr. Ross Ludwig telephone note on 09/16/19.    Dosing Weight: 53.9 kg    Goals:  Therapeutic Drug Levels  Tacrolimus trough goal: 8-10 ng/mL    Additional Clinical Monitoring/Outcomes  ?? Monitor renal function (SCr and urine output) and liver function (LFTs)  ?? Monitor for signs/symptoms of adverse events (e.g., hyperglycemia, hyperkalemia, hypomagnesemia, hypertension, headache, tremor)    Results:   Tacrolimus level = 6.4 ng/mL, drawn early (~10.3 hours post-dose) as tacrolimus dose was taken ~0000 per GI resident. True 12-hour trough is likely lower and below the desired range of 8-10 ng/mL.    Longitudinal Dose Monitoring:  Date Dose (mg)  AM / PM AM Scr (mg/dL) Level  (ng/mL) Key Drug Interactions   09/18/19 3 mg / 3 mg  1.53 6.4 N/A     Pharmacokinetic Considerations and Significant Drug Interactions:  ? Concurrent hepatotoxic medications: None identified  ? Concurrent CYP3A4 substrates/inhibitors: None identified  ? Concurrent nephrotoxic medications: Valcyte and Bactrim prophylaxis    Assessment/Plan:  Recommendedation(s)  ?? Continue current regimen of tacrolimus 3 mg (0.05 mg/kg) PO every 12 hours  ?? Repeat tacrolimus level tomorrow prior to AM dose to obtain a true 12-hour trough     Follow-up  ? Next level ordered: 09/19/19 at 1000  ? A pharmacist will continue to monitor and recommend levels as appropriate    Please page service pharmacist with questions/clarifications.    Aura Fey, PharmD  Pediatric Clinical Pharmacist  [phone] (pager)  [phone] (pediatric pharmacy)

## 2019-09-18 NOTE — Unmapped (Signed)
PICU Transfer Note    Interval events: Transfer to PICU from floor pediatric GI team for increased somnolence. He was admitted 11/7 PM with fever and lethargy. Asterixis present on exam and concerning for hepatic encephalopathy. Lactulose and rifaximin were started for hepatic encephalopathy. He was started on ceftriaxone and flagyl and transitioned to meropenam in the AM. Blood cultures, urine cultures pending. RPP and COVID negative. Albumin started for empiric SBP treatment. Planning for paracentesis.     LOS: 0 days    Assessment/Plan: [name] Gross is a 18 y.o. 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 He was recently hospitalized for acute rejection from 9/24-10/31 complicated by MSSA and strep mitis bacteremia s/p 14 days of IV antibiotics. He is now admitted with fever, fatigue and altered mental status. Overall picture concerning for SBP.    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 Concern for SBP on admission. Significant ascites and peripheral edema. Liver ultrasound with dopplers showing significant ascites, patent vasculature and mild increase in RI.  -Pediatric GI following, adult hepatology consulted - appreciate reccs  -S/p 0.5mg /kg albumin, 50g albumin ordered for day 3  -S/p ceftriaxone/flagyl, transitioned to meropenam 1g q8  -Discontinue tacrolimus immunosuppression per GI, 10 hr tac level low at 3.9  -Continue myfortic 540mg  BID  -Prednisone 40mg  daily  -Protonix 40mg  daily  -Rifaximin 550mg  BID  -Lactulose 30g TID  -MVI, Mag, calcium, Vit D3, NaPhos supplements  -Zofran q8 PRN for nausea  -Regular diet    ID: Fever tmax at home 103, WBC 6.5 from 2.2 1 week prior. S/p ceftriaxone/flagyl. Recently had strep mitis and MSSA bacteremia s/p 14 days unasyn.   -Adult transplant ID following, appreciate reccs  -F/u blood, urine cultures  -Paracentesis - obtain cell counts and bacterial/fungal culture  -Transplant viral studies - HHV, HSV, EBV, CMV  -Meropenam 1g q8  -Bactrim PPx MWF  -Valgancyclovir PPx daily    RENAL: Cr 1.81 11/5. AKI time correlates with supra-therapeutic tacrolimus level (13.0 on 11/3). SBP could also be contributing. HRS also a possibility. Abdominal ultrasound with increased bilateral echogenicity.  -Urine Cr, Na, Osm  -Serum Osm  -BMP daily  -Normal saline 13mL/hr    ENDO: Steroid induced hyperglycemia. Continue home insulin regimen.  -Adult endocrinology following, appreciate reccs  -HOLD Lantus??10??units at bedtime given normoglycemia so far during admission without taking home lantus for 2 days  -Nutritional insulin when eating:??Lispro??3-6??units with meals (Hold if NPO)  -Continue Lispro correction 1:50>150 ACHS  -PRN glucose tablet, glucagon for hypoglycemia     NEURO: AMS and asterixis. C/f hepatic encephalopathy.  -Rifaximin 550mg  BID  -Lactulose 30g TID  -Insert NGT if not taking lactulose    DERM: Oral thrush, steroid induced acne  -Nystatin swish TID  -Topical clindamycin, benzoyl peroxide, ketoconazole shampoo    SOCIAL: Hx of significant social stressors, patient cut ties with all family members. Appears to be mending the relationship with his parents. Lives primarily with paternal grandmother. [name] is HCPOA per ACP documentation.    RASS goal: N/A  DVT ppx: N/A     Changes to Lines/Tubes: None   Family communication: Mom, dad and paternal grandma here throughout day   Dispo: Admit to PICU    PICU Resident Pager: [phone]  PICU Resident Phone: [phone]    Vitals:  Dosing weight:  Most recent weight: 53.9 kg (118 lb 13.3 oz) (09/18/19 1100)    Temp:  [36.6 ??C-37.7 ??C] 36.7 ??C  Heart Rate:  [76-93] 84  SpO2 Pulse:  [73-90] 83  Resp:  [17-26] 26  BP: (98-132)/(56-77) 102/56  MAP (mmHg):  [70-88] 70  SpO2:  [100 %] 100 %  BMI (Calculated):  [17.6] 17.6     I/O       11/06 0701 - 11/07 0700 11/07 0701 - 11/08 0700 11/08 0701 - 11/09 0700    P.O.   240    I.V. (mL/kg)   1391.3 (25.8)    IV Piggyback   100    Total Intake   1731.3    Net   +1731.3           Stool Occurrence   1 x           Physical Exam:  General: Somnolent male in NAD  HEENT: Icteric sclerae, EOMI, PERRLA  CV: RRR, no murmur   Resp: CTAB, no increased WOB  Abd: Distended, soft, TTP diffusely  Ext: Moving all 4 ext spontaneously. 2-3+ pitting edema in bilateral ankles.  Neuro: A&O x3 but very lethargic and slow to respond    Labs and studies reviewed.     Lines/Tubes:   Patient Lines/Drains/Airways Status    Active Active Lines, Drains, & Airways     Name:   Placement date:   Placement time:   Site:   Days:    Peripheral IV 09/18/19 Left Forearm   09/18/19    0219    Forearm   less than 1

## 2019-09-18 NOTE — Unmapped (Signed)
Pediatric History and Physical      Assessment/Plan:   Active Problems:    Liver transplant rejection (CMS-HCC)    Fever  Resolved Problems:    * No resolved hospital problems. [name] Gross is a 18 y.o. 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 He was given tacrolimus, cellcept, steroids, and thyroglobulin treatment (on 10/01-10/12), with some improvement. Admission was complicated by MSSA and Strep mitis bacteremia with PICC in place, s/p 14 days of IV antibiotics & PICC removal prior to discharge on 10/31.     He now presents with fever 11/7 PM and increased fatigue x 1 day. He does not have any obviously associated viral or localizing symptoms but source of fever could be viral illness such as adenovirus given increased fatigue. However, in immunocompromised patient (lymphopenic and on immunosuppresive medications) and patient at risk for SBP in setting of protuberant abdomen with hx of ascites (he has chronic abdominal pain, no acute changes in pain), he warrants monitoring in the hospital on empiric IV antibiotics awaiting blood culture results. He is hemodynamically stable and non-toxic though chronically ill appearing.     [name] Gross has also had an AKI noted on outpatient labs since discharge, and Cr has been slowing downtrending with increased oral hydration. AKI is likely pre-renal due to mild dehydration/intravascular depletion and tacrolimus toxicity (goal 8-10, most recent trough was 13 11/5 and dose has been reduced since then). Chronic tremulousness could also be side effect of tacrolimus (levels have been frequently supratherapeutic over the last 1-2 months), and magnesium wasting seen with tacrolimus could be contributing to fatigue.    Of note, liver enzymes, GGT, and alk phos have worsened since discharge, but synthetic liver function labs have been stably abnormal (hypoalbuminemia, elevated Tbili, prolonged PT).     #Fever: U/A unremarkable for infection, CXR normal, flu/covid neg. Has associated fatigue, no other associated symptoms  - S/p CTX and Flagyl, will switch to meropenem to better cover for cholangitis, SBP, and other intra-abdominal pathology  - F/u blood and urine cx  - F/u viral PCRs   - F/u respiratory pathogen panel  - F/u liver ultrasound with doppler to quantify ascites and assess for portal hypertension   - CRM w/ q4h vitals  - Repeat CBCd at 2pm  - Adult Hepatology consulted, appreciate recs: agree with liver ultrasound but would strongly recommend diagnostic paracentesis if ascites is demonstrated on [place]    #AKI: Improving to 1.53 today down from 1.81 on 11/5 in setting of improved oral hydration & decreased tacro dose, BUN stably elevated at 24. Wt is down 6kg over last 2.5 weeks.  - mIVF NS at 100 ml/hr   * monitor closely for third-spacing  - strict I/Os  - trend BUN/Cr  - f/u urine cr and sodium  - repeat Chem 10 at 2pm    #Altered Mental Status: ddx includes fluctuating ammonia levels (most recently <9 --> 36), normal glucoses after a period of hyperglycemia, and electrolyte abnormalities, including hypomagnesemia  - f/u ammonia at 2pm  - low threshold for head imaging if he continues to be sleepy   - IV Mg repletion PRN    # Hx Liver Transplant - Subacute/Acute Liver Failure:   - Continue immunosuppression:  * Mycophenolate 540mg  bid  * Tacrolimus 3mg  bid (appreciate pharmacy assistance in dosing); f/u trough (goal 8-10)  *  prednisone 40 mg daily   - Prevention of hepatic encephalopathy:  * Rifaximin 550 mg BID  - Continue infection prophylaxis:  * Bactrim pphx 80mg  qMWF  * Valganciclovir pphx 450 mg    #Steroid induced hyperglycemia:??A1c??4.9%.  Still on PO prednisone??40mg  daily.??  - HOLD Lantus??10??units at bedtime given normoglycemia so far during admission without taking home lantus for 2 days  - Restart nutritional insulin when eating:??Lispro 3-6 units with meals (Hold if NPO)  - Continue Lispro correction 1:50>150 ACHS  - PRN glucose tablet, glucagon for hypoglycemia   - Endocrine following, appreciate recs    #Impetigo Skin Lesions on Right Ear and Left posterior Neck, + MSSA, healing well   - Mupirocin ointment for both lesions until healed, per derm    #Steroid-induced acne, possible fungal folliculitis   - Topical clindamycin, benzoyl peroxide, Ketoconazole shampoo      #Severe Malnutrition:  - Regular diet +  Ensure supplements PRN  - Encourage oral hydration  - Vit D3 5,000 units daily  - Aquadex multivitamin ??  - Continue calcium and mag ox supps  - K and phos supps 2 packets TID   - Pantoprazole 40 mg daily    #Oral Thrush:  - Nystatin TID until resolved    #Major Depressive Disorder  - Venlafaxine 37.5 mg daily  - Melatonin 3 mg q1800 for sleep     Access: PIV    Discharge criteria: Back to baseline level of functioning, able to tolerate PO    History:   Primary Care Provider: [name], MD    History provided by: mother    An interpreter was not used during the visit.     I have personally reviewed outside and/or ED records.     Chief Complaint: fatigue, swelling, fever     HPI:     [name] Gross is a 18 y.o. 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 Tacrolimus, and cellcept, thyroglobulin treatment was initiated (on 10/01-10/12), with some improvement in acute rejection markers. Admission was complicated by MSSA and Strep mitis bacteremia with PICC in place, s/p 14 days of IV antibiotics & PICC removal prior to discharge. He now presents with fever 11/7 PM.    History mostly provided by mom. Per mom, since discharge, [name] Gross been alternating mostly between her house and [name]'s house, sometimes stays at [facility] house. She reports that her relationship with [name] Gross is healing and that [name] Gross now seems more willing to adhere to medical therapy for his illness.     Initially after discharge on 10/31, [name] Gross was stable per mom. He was seen in clinic on 11/3 and had routine lab work that revealed Cr elevated to 1.6 from 0.88 prior. CMP also notable for worsening liver enzymes and synthetic liver function. On 11/5, outpatient labs showed worsening Cr to 1.8, BUN 25, bili 43, ALP 1000, GGT 543. He came to the ED as instructed after those labs resulted but left AMA after severe discomfort with attempts at IV placement that were not successful. Plan was made for him to increase his hydration, which he has been doing. Per mom, [name] Gross has been eating and drinking well.    However, mom notes that starting 11/7 AM [name] Gross appeared very tried and sluggish. On 11/7 PM, he felt warm so mom took his temperature orally and it was febrile to 101.51F. Mom then brought [name] Gross  to the ED, where his temp without any antipyretic given in the interval was 99.1F.     Mom also notes that [name] Gross has been very tremulous (not any better or worse over the last week), has had jaundiced eyes, and has had extremity swelling that worsens during the day and improves overnight and with extremity elevation.    No cough, congestion, runny nose, SOB, nausea, emesis, diarrhea, or rash. He has noted increased urinary frequency, no dysuria or changes in urine appearance.    Mom and Tremaine report that he has taken all of his medications as prescribed since discharge, except he has not taken Lantus 11/6 PM or 11/7 PM because on 11/6 AM he had a blood glucose of 62 upon awakening and mom has been hesitant to give it to him since then.     In the ED, he was afebrile and vitals were normal. CXR negative. Covid and flu swabs negative. CBC significant for hgb 9.7 (baseline appears to be between 8-10),  thrombocytosis to 26.  BMP with hyponatremia 134, hyperchloremia 108, Cr 1.53 (down from 1.81 on 11/5). Total protein of 2.4, total bili 31.5, AST elevated at 245, alk phos elevated at 1,021, and GGT elevated 504.  Ammonia high normal at 36.  PT elevated 18.9 and INR 1.63. Blood & urine culture collected. Given 1L NS bolus, started on MIVF, given CTX & flagyl.     PAST MEDICAL HISTORY:   Past Medical History:   Diagnosis Date   ??? 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 Depression    ??? MSSA bacteremia 08/27/2019   ??? Seasonal allergies    ??? Sickle cell trait (CMS-HCC)    ??? Strep Mitis Central line-associated bloodstream infection 08/27/2019       PAST SURGICAL HISTORY:  Past Surgical History:   Procedure Laterality Date   ??? FRENULECTOMY, LINGUAL     ??? PR ERCP REMOVE FOREIGN BODY/STENT BILIARY/PANC DUCT N/A 08/20/2017    Procedure: ENDOSCOPIC RETROGRADE CHOLANGIOPANCREATOGRAPHY (ERCP); W/ REMOVAL OF FOREIGN BODY/STENT FROM BILIARY/PANCREATIC DUCT(S);  Surgeon: Vonda Antigua, MD;  Location: GI PROCEDURES [facility]OR[facility];  Service: Gastroenterology   ??? PR ERCP REMOVE FOREIGN BODY/STENT BILIARY/PANC DUCT N/A 06/04/2018    Procedure: ENDOSCOPIC RETROGRADE CHOLANGIOPANCREATOGRAPHY (ERCP); W/ REMOVAL OF FOREIGN BODY/STENT FROM BILIARY/PANCREATIC DUCT(S);  Surgeon: Chriss Driver, MD;  Location: GI PROCEDURES [facility];  Service: Gastroenterology   ??? PR ERCP STENT PLACEMENT BILIARY/PANCREATIC DUCT N/A 11/21/2016    Procedure: ENDOSCOPIC RETROGRADE CHOLANGIOPANCREATOGRAPHY (ERCP); WITH PLACEMENT OF ENDOSCOPIC STENT INTO BILIARY OR PANCREATIC DUCT;  Surgeon: Vonda Antigua, MD;  Location: GI PROC[facility]URES [facility];  Service: Gastroenterology   ??? PR ERCP,W/REMOVAL STONE,BIL/PANCR DUCTS N/A 08/20/2017    Procedure: ERCP; W/ENDOSCOPIC RETROGRADE REMOVAL OF CALCULUS/CALCULI FROM BILIARY &/OR PANCREATIC DUCTS; Surgeon: Vonda Antigua, MD;  Location: GI PROC[facility]URES [facility];  Service: Gastroenterology   ??? PR ERCP,W/REMOVAL STONE,BIL/PANCR DUCTS N/A 06/04/2018    Procedure: ERCP; W/ENDOSCOPIC RETROGRADE REMOVAL OF CALCULUS/CALCULI FROM BILIARY &/OR PANCREATIC DUCTS;  Surgeon: Chriss Driver, MD;  Location: GI PROC[facility]URES [facility];  Service: Gastroenterology   ??? PR TRANSPLANT LIVER,ALLOTRANSPLANT N/A 09/22/2016    Procedure: LIVER ALLOTRANSPLANTATION; ORTHOTOPIC, PARTIAL OR WHOLE, FROM CADAVER OR LIVING DONOR, ANY AGE;  Surgeon: Doyce Loose, MD;  Location: MAIN OR [facility];  Service: Transplant   ??? PR  TRANSPLANT,PREP DONOR LIVER, WHOLE N/A 09/22/2016    Procedure: [facility] STD PREP CAD DONOR WHOLE LIVER GFT PRIOR TNSPLNT,INC CHOLE,DISS/REM SURR TISSU WO TRISEG/LOBE SPLT;  Surgeon: Doyce Loose, MD;  Location: MAIN OR [facility];  Service: Transplant   ??? PR UPGI ENDOSCOPY W/US FN BX N/A 08/20/2017    Procedure: UGI W/ TRANSENDOSCOPIC ULTRASOUND GUIDED INTRAMURAL/TRANSMURAL FINE NEEDLE ASPIRATION/BIOPSY(S), ESOPHAGUS;  Surgeon: Vonda Antigua, MD;  Location: GI PROC[facility]URES [facility];  Service: Gastroenterology   ??? PR UPPER GI ENDOSCOPY,BIOPSY N/A 07/15/2016    Procedure: UGI ENDOSCOPY; WITH BIOPSY, SINGLE OR MULTIPLE;  Surgeon: Arnold Long Mir, MD;  Location: PEDS PROCEDURE ROOM [facility];  Service: Gastroenterology   ??? PR UPPER GI ENDOSCOPY,CTRL BLEED Left 05/05/2016    Procedure: UGI ENDOSCOPY; WITH CONTROL OF BLEEDING, ANY METHOD;  Surgeon: Arnold Long Mir, MD;  Location: PEDS PROCEDURE ROOM [facility];  Service: Gastroenterology   ??? PR UPPER GI ENDOSCOPY,CTRL BLEED N/A 05/12/2016    Procedure: UGI ENDOSCOPY; WITH CONTROL OF BLEEDING, ANY METHOD;  Surgeon: Annie Paras, MD;  Location: CHILDRENS OR [facility];  Service: Gastroenterology       FAMILY HISTORY:  Family History   Problem Relation Age of Onset   ??? Diabetes Maternal Grandmother    ??? Diabetes Paternal Grandmother    ??? Hypertension Maternal Grandfather    ??? Hypertension Paternal Grandfather    ??? Asthma Brother    ??? Anesthesia problems Neg Hx    ??? Melanoma Neg Hx    ??? Basal cell carcinoma Neg Hx    ??? Squamous cell carcinoma Neg Hx        SOCIAL HISTORY:  Lives with mom, MGM, [name]      ALLERGIES:  Bee pollen and Pollen extracts     MEDICATIONS:    Current Outpatient Medications   Medication Sig Dispense Refill   ??? blood sugar diagnostic Strp Use to check blood sugar four times daily as directed. 100 strip 12   ??? blood-glucose meter kit Use as instructed 1 each 0   ??? calcium carbonate (OS-CAL) 1,500 mg (600 mg elem calcium) tablet Take 1/2 tablet (300 mg elem calcium) by mouth Two (2) times a day. (Over the counter med) 60 tablet 0   ??? cholecalciferol, vitamin D3, 125 mcg (5,000 unit) tablet Take 1 tablet (5,000 Units total) by mouth daily. 30 tablet 11   ??? clindamycin-benzoyl peroxide 1.2 %(1 % base) -5 % gel Apply to rash on chest 45 g 0   ??? glucagon 0.5 mg/0.1 mL Syrg Inject 0.1 mL under the skin once as needed for up to 1 dose. 0.2 mL 2   ??? glucose 4 GM chewable tablet Chew 1 tablet (4 g total) 4 (four) times a day as needed for low blood sugar (refer to hypoglycemia guideline). 50 tablet 12   ??? insulin glargine (LANTUS) 100 unit/mL injection Inject 0.1 mL (10 Units total) under the skin nightly. 10 mL 0   ??? insulin lispro (HUMALOG) 100 unit/mL injection Inject 0.06 mL (6 Units total) under the skin Five (5) times a day and Sliding scale 0 to 10 units under the skin five times a day as directed. 30 mL 0   ??? insulin syr/ndl U100 half mark (BD INSULIN SYRINGE HALF UNIT) 0.3 mL 31 gauge x 5/16 Syrg Use to inject insulins up to 6 times daily as directed. 200 each 12   ??? ketoconazole (NIZORAL) 2 % shampoo Apply topically daily as directed. 120 mL 0   ??? lancets  Misc Use to check blood glucose 6 times per day. 204 each 0   ??? magnesium oxide (MAG-OX) 400 mg (241.3 mg magnesium) tablet Take 1 tablet (400 mg total) by mouth daily. 30 tablet 3   ??? melatonin 3 mg Tab Take 1 tablet (3 mg total) by mouth every evening. 60 tablet 0   ??? multivitamin, with zinc (AQUADEKS) 100-5 mcg-mg Chew Chew 2 tablets daily. 60 tablet 0   ??? mycophenolate (MYFORTIC) 180 MG EC tablet Take 3 tablets (540 mg total) by mouth Two (2) times a day. 180 tablet 11   ??? nystatin (MYCOSTATIN) 100,000 unit/mL suspension Take 10 mls (1,000,000 Units total) by mouth Three (3) times a day. 900 mL 0   ??? pantoprazole (PROTONIX) 40 MG tablet Take 1 tablet (40 mg total) by mouth daily. 30 tablet 0   ??? polyethylene glycol (MIRALAX) 17 gram packet Take 17 g (1 packet dissolved in 4 to 8 oz. of liquid) by mouth two (2) times a day as needed (constipation). 100 each 0   ??? potassium & sodium phosphates 250mg  (PHOS-NAK/NEUTRA PHOS) 280-160-250 mg PwPk Take 2 packets by mouth Three (3) times a day. 180 packet 0   ??? predniSONE (DELTASONE) 20 MG tablet Take 2 tablets (40 mg total) by mouth daily. 60 tablet 0   ??? rifAXIMin (XIFAXAN) 550 mg Tab Take 1 tablet (550 mg total) by mouth Two (2) times a day. 60 tablet 0   ??? sulfamethoxazole-trimethoprim (BACTRIM) 400-80 mg per tablet Take 1 tablet (80 mg of trimethoprim total) by mouth Every Monday, Wednesday, and Friday. 12 tablet 0   ??? tacrolimus (PROGRAF) 1 MG capsule Take 4 capsules (4 mg) by mouth two (2) times a day. 240 capsule 11   ??? valGANciclovir (VALCYTE) 450 mg tablet Take 1 tablet (450 mg total) by mouth daily. 30 tablet 0   ??? venlafaxine (EFFEXOR-XR) 37.5 MG 24 hr capsule Take 1 capsule (37.5 mg total) by mouth daily. 30 capsule 0        IMMUNIZATIONS: up to date and documented    ROS:  The remainder of 10 systems reviewed were negative except as mentioned in the HPI.       Physical:   Vital signs: BP 132/67  - Pulse 86  - Temp 37.7 ??C (Oral)  - Resp 18  - SpO2 100%   There were no vitals filed for this visit., No weight on file for this encounter.   Ht Readings from Last 1 Encounters:   09/13/19 176.6 cm (5' 9.53) (49 %, Z= -0.02)*     * Growth percentiles are based on CDC (Boys, 2-20 Years) data.   , No height on file for this encounter.  HC Readings from Last 1 Encounters:   No data found for [facility]    No head circumference on file for this encounter.  There is no height or weight on file to calculate BMI., No height and weight on file for this encounter.  General:  Sleeping soundly, eyes half open, not in acute distress, awakens and responds appropriately on exam  Head:  atraumatic and normocephalic  Eyes:   EOMI. Marked scleral icterus present.  Ears:   External auditory canals are clear  Nose:  Clear, no discharge  Oropharynx:  Minimal thrush present in back of throat over uvula  Neck:  Supple, no LAD  Lungs:  Clear breath sounds bilaterally, breathing comfortably in room air.   Heart:  regular rate and rhythm. No murmurs appreciated on  auscultation.  Abdomen:   Abdomen is protuberant but soft. No spleen tip or liver edge palpable.   Neuro:  Deconditioned, moves all extremities with decreased strength. Persistent tremor in bilateral upper extremities, most pronounced when reaching for objects.  Extremities:  Trace edema in lower extremities, no edema in upper extremities, WWP  Skin: Skin lesions on left neck, healing well. Skin lesion present on right ear with scab. Acneform rash present on face, back, chest.     Labs/Studies:  Reviewed and significant for the following:  - Glucoses 167, 99  - UA w/ bilirubin  - Ammonia 46 --> <9 --> 36  - CXR wnl  - Flu/covid/RSV negative  - PT 16.9 --> 18.9  - CMP significant for Na 134, Cl 108, BUN 24 (stably elevated), Cr 1.81 --> 1.53, Ca 8.3, Mg 1.0, normal phos, albumin 2.4, Tbili 43.1 --> 31.5, AST 245 (stably elevated), Alk phos 1021 (stably elevated), GGT 504 (stably elevated)  - POC VBG wnl  - CBCd with Hgb 9.7 (stably low), plt 467, ALC 0.2 (stably low)    Margot Chimes MD  PGY3 Pediatrics  [phone]      I performed a history and physical examination of the patient and discussed the patient's management with the family and the resident. I personally reviewed the results of all tests, including blood work and images. I reviewed and edited the resident's note and agree with the documented findings and plan of care. I provided direct supervision to the care of the patient. I spent over 50% of a total 70 min counseling and/or coordinating the care of this patient.  [name] Gross is altered on my exam and difficult to arouse.  Once awake, is alert and oriented.  No focal findings on Neuro exam.  Plan stat CT head and transfer to PICU.     Rocco Pauls, MD  Pediatric Gastroenterology

## 2019-09-18 NOTE — Unmapped (Signed)
As per mother, pt came in due to a fever. Pt is a liver transplant pt, that had a transplant 3 years ago. Pt was diagnosed 3 years ago with hepatitis and cirrhosis of the liver. Pt denies current pain.
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
## 2019-09-18 NOTE — Unmapped (Signed)
ED Progress Note    Arjen is a 18 year old with autoimmune hepatitis c/b ESLD s/p transplant in 2017, concern for rejection with recent admission 9/24-10/31 for this MDD, recent homelessness (now lives with family) who presents with fever. Recently admitted 9/24-10/31 for rejection. Hospitalization complicated by Strep mitis, MSSA bacteremia, AKI, thrombocytopenia, cholestasis. Discharged home on 10/31. Mom reports that since going home she has noted more fatigue, yellowing color of eyes and skin, as well as whole body tremor. Has had lab work since with worsening AKI, evidence of cholestasis and then notably on 11/7 was febrile to 101.4 Got CTX/flagyl. Peds GI to admit. Has chronic abdominal pain, nothing worse than baseline.    Received no calls or pages for this patient during this shift. Patient was admitted to the floor.    Antonieta Loveless, MD, PGY-2  [facility]
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
## 2019-09-18 NOTE — Unmapped (Signed)
Pt rounding completed. Patient is in room resting. Reviewed with patient plan of care, patient belongings and call bell within reach. Patients bed in low position and locked. Will continue to monitor patient and reassess.

## 2019-09-19 LAB — TOXICOLOGY SCREEN, URINE
AMPHETAMINE SCREEN URINE: <500
BARBITURATE SCREEN URINE: <200
BENZODIAZEPINE SCREEN, URINE: <200
CANNABINOID SCREEN URINE: <20
COCAINE(METAB.)SCREEN, URINE: <150
OPIATE SCREEN URINE: <300

## 2019-09-19 LAB — COMPREHENSIVE METABOLIC PANEL
ALBUMIN: 2.4 g/dL — ABNORMAL LOW (ref 3.5–5.0)
ALKALINE PHOSPHATASE: 832 U/L — ABNORMAL HIGH (ref 65–260)
AST (SGOT): 165 U/L — ABNORMAL HIGH (ref 19–55)
BLOOD UREA NITROGEN: 24 mg/dL — ABNORMAL HIGH (ref 7–21)
BUN / CREAT RATIO: 20
CALCIUM: 8.4 mg/dL — ABNORMAL LOW (ref 8.5–10.2)
CHLORIDE: 113 mmol/L — ABNORMAL HIGH (ref 98–107)
CREATININE: 1.22 mg/dL (ref 0.70–1.30)
EGFR CKD-EPI AA MALE: >90 mL/min/{1.73_m2} (ref >=60)
EGFR CKD-EPI NON-AA MALE: 85 mL/min/{1.73_m2} (ref >=60)
GLUCOSE RANDOM: 175 mg/dL — ABNORMAL HIGH (ref 70–99)
POTASSIUM: 3.9 mmol/L (ref 3.5–5.0)
SODIUM: 142 mmol/L (ref 135–145)

## 2019-09-19 LAB — CBC W/ AUTO DIFF
BASOPHILS RELATIVE PERCENT: 0.6 %
EOSINOPHILS RELATIVE PERCENT: 0.1 %
HEMATOCRIT: 27 % — ABNORMAL LOW (ref 41.0–53.0)
HEMOGLOBIN: 8.8 g/dL — ABNORMAL LOW (ref 13.5–17.5)
LARGE UNSTAINED CELLS: 1 % (ref 0–4)
LYMPHOCYTES ABSOLUTE COUNT: 1.3 10*9/L — ABNORMAL LOW (ref 1.5–5.0)
LYMPHOCYTES RELATIVE PERCENT: 21.4 %
MEAN CORPUSCULAR HEMOGLOBIN CONC: 32.6 g/dL (ref 31.0–37.0)
MEAN CORPUSCULAR HEMOGLOBIN: 30.3 pg (ref 26.0–34.0)
MEAN CORPUSCULAR VOLUME: 93 fL (ref 80.0–100.0)
MEAN PLATELET VOLUME: 8.7 fL (ref 7.0–10.0)
MONOCYTES ABSOLUTE COUNT: 0.4 10*9/L (ref 0.2–0.8)
MONOCYTES RELATIVE PERCENT: 6.9 %
NEUTROPHILS ABSOLUTE COUNT: 4.1 10*9/L (ref 2.0–7.5)
NEUTROPHILS RELATIVE PERCENT: 70.5 %
PLATELET COUNT: 400 10*9/L (ref 150–440)
RED BLOOD CELL COUNT: 2.9 10*12/L — ABNORMAL LOW (ref 4.50–5.90)
RED CELL DISTRIBUTION WIDTH: 26 % — ABNORMAL HIGH (ref 12.0–15.0)
WBC ADJUSTED: 5.8 10*9/L (ref 4.5–11.0)

## 2019-09-19 LAB — PHOSPHORUS: Phosphate:MCnc:Pt:Ser/Plas:Qn:: 4.3

## 2019-09-19 LAB — LYMPHOCYTES RELATIVE PERCENT: Lymphocytes/100 leukocytes:NFr:Pt:Bld:Qn:Automated count: 21.4

## 2019-09-19 LAB — AMPHETAMINE SCREEN URINE: Lab: <500

## 2019-09-19 LAB — GLUCOSE FLUID: Lab: 137

## 2019-09-19 LAB — GLUCOSE, BODY FLUID: GLUCOSE FLUID: 137 mg/dL

## 2019-09-19 LAB — PROTEIN TOTAL: Protein:MCnc:Pt:Ser/Plas:Qn:: 0

## 2019-09-19 LAB — HHV6 PCR, BLOOD: Herpes virus 6 DNA:PrThr:Pt:Ser:Ord:Probe.amp.tar: NEGATIVE

## 2019-09-19 LAB — MAGNESIUM: Magnesium:MCnc:Pt:Ser/Plas:Qn:: 1.6

## 2019-09-19 NOTE — Unmapped (Deleted)
DERMATOLOGY CLINIC NOTE      A/P:    ***    There are no diagnoses linked to this encounter.       No follow-ups on file. or sooner as needed      CC:  Chief Complaint   Patient presents with   ??? Skin Problem     breaking out with black spots all over body from the steroids he has to take. States the medication that was prescribed to treat is drying his skin out       HPI:  18 y.o. male last seen by Dr Heloise Beecham on 08/2019 as a hospital consult for rash. 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     Pertinent PMH: Reviewed in Epic.   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 Adjustment disorder with anxious mood   ??? Recurrent major depressive disorder, in partial remission (CMS-HCC)   ??? Malnutrition of mild degree (CMS-HCC)   ??? History of liver transplant (CMS-HCC)   ??? Allergic rhinitis   ??? Transaminitis   ??? History of suicide attempt   ??? 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 Acute rejection of liver transplant (CMS-HCC)   ??? Ingestion of multiple medications   ??? Liver transplant rejection (CMS-HCC)   ??? Normocytic anemia   ??? AKI (acute kidney injury) (CMS-HCC)   ??? Electrolyte disturbance   ??? Hypoalbuminemia   ??? H/O liver transplant (CMS-HCC)   ??? 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 High serum gamma glutamyl transferase (GGT)   ??? Steroid-induced hyperglycemia   ??? Pityrosporum folliculitis ??? Bilateral knee swelling   ??? Homeless   ??? Thrombocytopenia (CMS-HCC)   ??? Hypomagnesemia   ??? Hypophosphatasia   ??? Other ascites   ??? Hyperbilirubinemia   ??? Impetigo lesions of Right Ear and Posterior Neck   ??? Steroid-induced acne   ??? 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 Irritant contact dermatitis of the hands   ??? Sleep difficulties   ??? Fever     Current Outpatient Medications   Medication Instructions   ??? blood sugar diagnostic Strp Use to check blood sugar four times daily as directed.   ??? blood-glucose meter kit Use as instructed   ??? calcium carbonate (OS-CAL) 1,500 mg (600 mg elem calcium) tablet Take 1/2 tablet (300 mg elem calcium) by mouth Two (2) times a day. (Over the counter med)   ??? cholecalciferol (vitamin D3) 5,000 Units, Oral, Daily (standard)   ??? clindamycin-benzoyl peroxide 1.2 %(1 % base) -5 % gel Apply to rash on chest   ??? glucagon 0.5 mg/0.1 mL Syrg 0.1 mL, Subcutaneous, Once as needed   ??? glucose 4 g, Oral, 4 times daily PRN   ??? insulin lispro (HUMALOG) 100 unit/mL injection Inject 0.06 mL (6 Units total) under the skin Five (5) times a day and Sliding scale  0 to 10 units under the skin five times a day as directed.   ??? insulin syr/ndl U100 half mark (BD INSULIN SYRINGE HALF UNIT) 0.3 mL 31 gauge x 5/16 Syrg Use to inject insulins up to 6 times daily as directed.   ??? ketoconazole (NIZORAL) 2 % shampoo Apply topically daily as directed.   ??? lancets Misc Use to check blood glucose 6 times per day.   ??? LANTUS U-100 INSULIN 10 Units, Subcutaneous, Nightly   ??? magnesium oxide (MAG-OX) 400 mg, Oral, Daily (standard)   ??? melatonin 3 mg, Oral, Every evening   ??? multivitamin, with zinc (AQUADEKS) 100-5 mcg-mg Chew 2 tablets, Oral, Daily (standard)   ??? mycophenolate (MYFORTIC) 540 mg, Oral, 2 times a day (standard)   ??? nystatin (MYCOSTATIN) 100,000 unit/mL suspension Take 10 mls (1,000,000 Units total) by mouth Three (3) times a day. ??? pantoprazole (PROTONIX) 40 mg, Oral, Daily (standard)   ??? polyethylene glycol (MIRALAX) 17 gram packet Take 17 g (1 packet dissolved in 4 to 8 oz. of liquid) by mouth two (2) times a day as needed (constipation).   ??? potassium & sodium phosphates 250mg  (PHOS-NAK/NEUTRA PHOS) 280-160-250 mg PwPk 2 packets, Oral, 3 times a day (standard)   ??? predniSONE (DELTASONE) 40 mg, Oral, Daily   ??? sulfamethoxazole-trimethoprim (BACTRIM) 400-80 mg per tablet 80 mg of trimethoprim, Oral, Every Mon-Wed-Fri   ??? tacrolimus (PROGRAF) 1 MG capsule Take 4 capsules (4 mg) by mouth two (2) times a day.   ??? valGANciclovir (VALCYTE) 450 mg, Oral, Daily (standard)   ??? venlafaxine (EFFEXOR-XR) 37.5 mg, Oral, Daily   ??? XIFAXAN 550 mg, Oral, 2 times a day (standard)     Allergies   Allergen Reactions   ??? Bee Pollen Rash     Pt has seasonal allergies that cause excessive sneezing and running nose- [name], NAII   ??? Pollen Extracts Rash     Pt has seasonal allergies that cause excessive sneezing and running nose- Jessie, NAII         ROS: Baseline state of health. No fever, no other skin lesions except as noted in HPI    PE:  General: Well-developed, well-nourished, in no acute distress  Neuro: Alert and oriented, interacts appropriately  Mouth: {mouthexam:69820}  Ext: {nailexam:69821}  Skin: {type of derm exam:63474} the {examloc:62482:s} was performed and notable for the following. All other areas examined were normal or had no significant findings.  - ***

## 2019-09-19 NOTE — Unmapped (Signed)
Inpatient Pediatric Nutrition Consult Note    Reason for Consult:   Visit Type: RD Risk Alert, RN Consult  Reason for Visit: Assessment     History of Present Illness: Pt is a 18 yr old male well known to nutrition services from previous admissions (recently discharged 10/31 for acute rejection treatment c/b MSSA and strep mitis bacteremia with PICC placement for 14 days of IV abx). 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 Current admission for fever, increased fatigue, AMS, and somnolence with concerns for SBP.     Anthropometrics:  Current Wt: 53.9 kg (118 lb 13.3 oz) 3 %ile (Z= -1.85) based on CDC (Boys, 2-20 Years) weight-for-age data using vitals from 09/18/2019.  Length or Height: 175 cm (5' 8.9)(pt unable to stand all the way straight ) 40 %ile (Z= -0.24) based on CDC (Boys, 2-20 Years) Stature-for-age data based on Stature recorded on 09/18/2019.  Current BMI:  Body mass index is 17.6 kg/m??. <1 %ile (Z= -2.46) based on CDC (Boys, 2-20 Years) BMI-for-age based on BMI available as of 09/18/2019.     Admission Wt: 53.9 kg (118 lb 13.3 oz)  Ideal Body Wt: 69.8 kg (BMI/age at 50%ile)    Growth velocity/trends: Acute loss -2.2kg (3.9%) since 09/08/19 but overall down 4.6kg (7.9%) since 10/13/2018.     Nutrition-Focused Physical Exam:  Nutrition Evaluation  Overall Impressions: Unable to perform Nutrition-Focused Physical Exam at this time due to (comment)(pitting edema) (09/19/19 1502)  Nutrition Designation: Underweight (BMI < 18.50  kg/m2) (09/19/19 1502)    Nutritionally Relevant Data:  Meds: Nutritionally-relevant medications reviewed. Medications include famotidine, Prednisone, tacrolimus (currently held for elevated trough level and AKI on admission), insulin, Phos-NaK, Mag-Os, AquaADEKs, Calcium Carbonate, Cholecalciferol, Flagyl, and antibiotics     IV Fluids: NS at 100 mL/hr - stopping today d/t net fluid positive and concurrent 3+ pitting edema and tolerating POs     Labs: Reviewed chemistry 11/9 relatively unremarkable. Steroid induced hyperglycemia followed by [facility] End[facility]. Elevated ALP and LFTs likely associated with chronic rejection. Elevated BUN stable with normal creatinine-improving.     Emesis: none   Stools: 3 times in past 24 hours    UOP: minimal     Home Nutrition History:  Food Allergies/Intolerances: NKFA per chart review     PO Diet: Regular diet PO ad lib with Ensure Clear PRN. Per chart review, as hx of food insecurity and poor appetite     Current Medical Nutrition Therapy:  Oral Diet: Regular   Supplements: None ordered     Estimated Nutrient Needs:  Nutrition Calculation Weight: Current  Calculation Method: Catch up growth(REE X 1.6-1.8 stress factors)  Calculation Consideration : Disease State    Estimated Energy Needs: (1610-9604 kcals/day ( = 45-50 kcals/kg))  Total Protein Estimated Needs: 1.5-2 grams/kg  Total Fluid Estimated Needs: Per Holliday-Segar(2270mL/day = 41 mL/kg)    Weight and Growth Goal:  Weight Gain Velocity Goal: Promote weight trend towards desired BMI  Recommended body mass index (BMI): 25-50 %    Pediatric AND/ASPEN Malnutrition Screening:  Overall Impression: Unable to complete Malnutrition Assessment at this time due to (comment)(edema but at risk for chronic moderate to severe malnutrition based on net weight loss and BMI Zscore) (09/19/19 1558)    Nutrition Assessment:  ?? Pt is not meeting established goals for growth. Pt has history of malnutrition but unable to assess due to fluid/pitting edema. Edema may be nutritional vs chronic illness  related. Pt is at risk for chronic moderate to severe malnutrition based on current BMI Zscore -2.46 and net weight loss 7.9% within the past year. Ordered for Regular diet and reportedly ate breakfast this morning. Will monitor intake closely while inpatient and encourage oral nutrition supplements.   ?? Labs reviewed above. Meds reviewed- currently ordered for multivitamin, vitamin, mineral, and electrolyte supplements per home regimen.    ?? Pt is at increased risk of fat-soluble vitamin deficiencies.   ?? Pt has increased Ca++ and vitamin D needs with steroid therapy. And requiring insulin regimen for steroid induced hyperglycemia.   ?? Pt has increased Mag needs with tacrolimus therapy.   ?? Pt may have additional electrolyte requirements if diuretics added for fluid removal.     Nutrition Interventions/Recommendations:   ?? Continue Regular diet and Ensure Clear oral supplements PRN per home regimen   ?? Continue vitamin/mineral supplementation per home regimen as ordered   ?? Daily weights.    ?? Service RD will continue to monitor weekly throughout hospital course and update nutrition POC as needed.     Signed by:  Othelia Pulling MS, RDN, LDN   Pager #: [phone]

## 2019-09-19 NOTE — Unmapped (Signed)
GASTROENTEROLOGY INPATIENT FOLLOW UP NOTE     Requesting Attending Physician:  Morrison Old, MD    Reason for Consult:  [name] Gross is a 18 y.o. male seen in consultation at the request of Dr. Morrison Old, MD for fever in OLT patient.    Interval History:  More awake and interactive today compared to yesterday per report.  No acute complaints today. Abdominal pain has improved.  HDS. No fever while admitted. Remains on CTX/Flagyl.    - TTE normal without vegetation. New large R sided pleural effusion noted.  - Liver doppler with patent portal vasculature. No biliary dilation  - No SBP on today paracentesis although has been on abx for >36hrs.    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  Recent admission with MSSA and strep mitis bacteremia.  No obvious source for fevers and AMS although is improving with holding tacro and treating with antibiotics. He does not have indwelling stents in place currently with last ERCP July 2019.  Current LFT pattern near baseline with his acute on chronic rejection.    MELD-Na score: 25 at 09/19/2019  3:51 AM  MELD score: 25 at 09/19/2019  3:51 AM  Calculated from:  Serum Creatinine: 1.22 mg/dL at 16/11/958  4:54 AM  Serum Sodium: 142 mmol/L (Rounded to 137 mmol/L) at 09/19/2019  3:51 AM  Total Bilirubin: 26.7 mg/dL at 07/18/1190  4:78 AM  INR(ratio): 1.46 at 09/18/2019  1:11 PM  Age: 22 years 5 months    His MELD is stable and further transplant requirements will depend on his ability to demonstrate medical compliance and stable social support.  Will discuss admission at transplant conference on Wednesday 11/11.     Recommendations:  - continue lactulose for 3-5 BM daily and rifaximin  - appreciate ID recommendations for antibiotics and additional infectious workup. If AMS again, agree with LP recommendations  - we will have transplant psych/social evaluation again  - would limit maintenance IVF given ascites and large pleural effusion and ascites.  Can give albumin if needed for volume re-expansion  - consider thoracentesis for large R sided pleural effusion as infectious source. Repeat CXR.  - hold tacro, continue myfortic, prednisone. Further recommendation per pediatric hepatology team  - low salt diet given volume overload    Please page [phone] for further questions/concerns. Patient seen and discussed with Dr. Sherryll Burger.    Orion Crook. Ariana Cavenaugh, MD MPH  Gastroenterology Fellow           Medications:  Current Facility-Administered Medications   Medication Dose Route Frequency Provider Last Rate Last Dose   ??? benzoyl peroxide 5 % gel   Topical Nightly Harvie Heck, MD       ??? calcium carbonate (OS-CAL) tablet 300 mg elem calcium  300 mg elem calcium Oral BID Harvie Heck, MD   300 mg elem calcium at 09/19/19 0903   ??? cefTRIAXone (ROCEPHIN) 2 g in sodium chloride 0.9 % (NS) 100 mL IVPB-connector bag  2 g Intravenous Q24H Harvie Heck, MD       ??? cholecalciferol (vitamin D3) tablet 5,000 Units  5,000 Units Oral Daily Harvie Heck, MD   5,000 Units  at 09/19/19 0903   ??? clindamycin (CLEOCIN T) 1 % lotion   Topical BID Harvie Heck, MD       ??? dextrose 50 % in water (D50W) 50 % solution 12.5 g  12.5 g Intravenous Q10 Min PRN Harvie Heck, MD       ??? diphenhydrAMINE (BENADRYL) injection 25 mg  25 mg Intravenous Once PRN Alison Murray Hoppens, MD       ??? glucose chewable tablet 4 g  4 g Oral 4x Daily PRN Harvie Heck, MD       ??? insulin glargine (LANTUS) injection 5 Units  5 Units Subcutaneous Nightly Harvie Heck, MD       ??? insulin lispro (HumaLOG) injection 0-10 Units  0-10 Units Subcutaneous 4x Daily Harvie Heck, MD   2 Units at 09/19/19 1246   ??? insulin lispro (HumaLOG) injection 3-6 Units  3-6 Units Subcutaneous 4x Daily Harvie Heck, MD   3 Units at 09/19/19 1247   ??? ketoconazole (NIZORAL) 2 % shampoo   Topical Daily Harvie Heck, MD   Stopped at 09/18/19 1030   ??? lactulose (CHRONULAC) oral solution  30 g Oral TID Harvie Heck, MD   30 g at 09/19/19 1432   ??? magnesium oxide (MAG-OX) tablet 400 mg  400 mg Oral Daily Harvie Heck, MD   400 mg at 09/19/19 1610   ??? melatonin tablet 3 mg  3 mg Oral QPM Harvie Heck, MD   3 mg at 09/18/19 2026   ??? metroNIDAZOLE (FLAGYL) IVPB 500 mg  500 mg Intravenous Q8H Harvie Heck, MD       ??? multivitamin, with zinc (AQUADEKS) chewable tablet  2 tablet Oral Daily Harvie Heck, MD   2 tablet at 09/19/19 0904   ??? mupirocin (BACTROBAN) 2 % ointment   Topical TID Harvie Heck, MD       ??? mycophenolate (MYFORTIC) EC tablet 540 mg  540 mg Oral BID Harvie Heck, MD   540 mg at 09/19/19 0904   ??? nystatin (MYCOSTATIN) oral suspension  1,000,000 Units Oral TID Harvie Heck, MD   1,000,000 Units at 09/19/19 0902   ??? ondansetron (ZOFRAN-ODT) disintegrating tablet 4 mg  4 mg Oral Q8H PRN Harvie Heck, MD       ??? pantoprazole (PROTONIX) EC tablet 40 mg  40 mg Oral Daily Harvie Heck, MD   40 mg at 09/19/19 0903   ??? polyethylene glycol (MIRALAX) packet 17 g  17 g Oral BID PRN Harvie Heck, MD       ??? potassium & sodium phosphates 250mg  (PHOS-NAK/NEUTRA PHOS) packet 2 packet  2 packet Oral TID Harvie Heck, MD   2 packet at 09/19/19 1432   ??? predniSONE (DELTASONE) tablet 40 mg  40 mg Oral Daily Harvie Heck, MD   40 mg at 09/19/19 0859   ??? rifAXIMin (XIFAXAN) tablet 550 mg  550 mg Oral BID Harvie Heck, MD   550 mg at 09/19/19 0903   ??? sulfamethoxazole-trimethoprim (BACTRIM) 400-80 mg tablet 80 mg of trimethoprim  1 tablet Oral Q MWF Harvie Heck, MD   80 mg of trimethoprim at 09/19/19 0904   ??? valGANciclovir (VALCYTE) tablet 450 mg  450 mg Oral Daily Harvie Heck, MD   450 mg at 09/19/19 0904       Vital Signs:  Temp:  [36.6 ??C-37 ??C] 36.8 ??C  Heart  Rate:  [74-97] 74  SpO2 Pulse:  [65-94] 93  Resp:  [16-28] 16  BP: (98-124)/(54-76) 124/76  MAP (mmHg):  [68-93] 88  SpO2:  [98 %-100 %] 100 %    Intake/Output last 3 shifts:  I/O last 3 completed shifts:  In: 3351.3 [P.O.:360; I.V.:2691.3; IV Piggyback:300]  Out: 1480 [Urine:280; Stool:1200]    Physical Exam:  Constitutional: laying in bed in NAD.  Answering questions appropriately but slowly.  HEENT: PEERL, +scleral icterus  CV: Regular rate and rhythm  Pulmonary: no increased WOB  Abdomen: soft, normal bowel sounds, moderate ascites, non-tender.  Skin: jaundiced, acne  Extremities: WWP, no edema  Neuro: Alert, Oriented x 3, answers questions appropriately but slowly    Diagnostic Studies:   Labs:  Recent Labs     09/18/19  0219 09/19/19  0351   WBC 6.5 5.8   HGB 9.7* 8.8*   HCT 29.5* 27.0*   PLT 467* 400     Recent Labs     09/18/19  0220 09/18/19  1311 09/18/19  1606 09/19/19  0351   NA 134* 133* 138 142   K 3.6  --  4.3 - 4.1 3.9   CL 108* 110*  --  113*   BUN 24* 24*  --  24*   CREATININE 1.53* 1.27  --  1.22   GLU 102 191*  --  175*     Recent Labs     09/18/19  0220 09/18/19  1606 09/19/19  0351   PROT  --  6.0*  --    ALBUMIN 2.4* 2.9* 2.4*   AST 245* 208* 165*   ALKPHOS 1,021* 782* 832*   BILITOT 31.5* 25.3* 26.7*     Recent Labs     09/18/19  0220 09/18/19  1311   INR 1.63 1.46   APTT  --  30.2       Imaging/Procedures:  ERCP  Impression:        - One visibly patent stent from the biliary tree was seen                      in the major papilla.                     - Prior biliary sphincterotomy appeared open.                     - A filling defect consistent with a stone and sludge was                      seen on the cholangiogram.                     - One stent was removed from the biliary tree.                     - The biliary tree was swept.                     - Choledocholithiasis was found. Complete removal was                      accomplished by balloon extraction.    TTE   1. No intracardiac vegetations visualized.   2. Normal right ventricular cavity size and systolic function.   3. Normal left ventricular cavity size and systolic function.   4. Large right  sided pleural effusion.    Liver Doppler  -- Increased and heterogeneous hepatic parenchymal echogenicity which is nonspecific, however consistent with chronic liver disease. Mild periportal edema.  -- Patent hepatic transplant vasculature.   -- Mild interval increase in resistive indices in the hepatic transplant arteries, within normal limits.  -- Increased bilateral renal echogenicity which is nonspecific, however can be seen in medical renal disease.  -- Overall stable to mildly decreased portal vein velocities, within normal limits.  -- Large volume ascites.  -- Right pleural effusion.    CT Head  No acute intracranial abnormality.

## 2019-09-19 NOTE — Unmapped (Signed)
Endocrinology treatment plan note:    Mr. Imran is a 18 year old with DM-2 / medication induced hyperglycemia. He was off the floor for procedures earlier in the day. Recommend the following for now with full consult to be competed within 24 hours.    - Humalog (Lispro) 2:50>150achs correction.  - Humalog (Lispro) 3 - 6 units with meals / night time snacks.  - Hold Lantus (Glargine) for now.    Alben Spittle, MD  [facility] Department of Endocrinology

## 2019-09-19 NOTE — Unmapped (Signed)
Afebrile.  VSS.  Lethargic.  Able to answer questions appropriately; oriented to person, place, situation, and vaguely time.  Room air.  Clear.  Abdomen distended.  Gross ascites.  Generalized edema and localized in bilateral ankles and feet.  Arrived to unit approximately 1500 after CT scan.  U/s completed at bedside.  Labs sent.  One time albumin dose given.  BS within normal range; insulin held, but may be able to eat later.  Order written as 4 times/ day to allow for late night eating.  No UOP since arrival to unit; says he will try soon.  Void reported from the floor.  Parents and grandmother updated at bedside.    Problem: Adult Inpatient Plan of Care  Goal: Plan of Care Review  Outcome: Progressing  Goal: Patient-Specific Goal (Individualization)  Outcome: Progressing  Goal: Absence of Hospital-Acquired Illness or Injury  Outcome: Progressing  Goal: Optimal Comfort and Wellbeing  Outcome: Progressing  Goal: Readiness for Transition of Care  Outcome: Progressing  Goal: Rounds/Family Conference  Outcome: Progressing     Problem: Self-Care Deficit  Goal: Improved Ability to Complete Activities of Daily Living  Outcome: Progressing     Problem: Fall Injury Risk  Goal: Absence of Fall and Fall-Related Injury  Outcome: Progressing     Problem: Wound  Goal: Optimal Wound Healing  Outcome: Progressing

## 2019-09-19 NOTE — Unmapped (Signed)
Care Management  Initial Transition Planning Assessment  Per H&P:[name] Gross is a 18 y.o. 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 He was given tacrolimus, cellcept, steroids, and thyroglobulin treatment (on 10/01-10/12), with some improvement. Admission was complicated by MSSA and Strep mitis bacteremia with PICC in place, s/p 14 days of IV antibiotics & PICC removal prior to discharge on 10/31.He now presents with fever 11/7 PM and increased fatigue x 1 day.         General  Care Manager assessed the patient by : In person interview with patient, In person interview with family, Medical record review, Discussion with Clinical Care team   CM met with patient in pt room.  Pt was not wearing hospital provided mask for the duration of the interaction with CM. [name] (Alice) was wearing a hospital provided mask.CM was wearing hospital provided surgical mask and hospital provided eye protection.  CM was within 6 foot of the patient/visitors during this interaction.   Pt answered all questions asked by CM but did not appear engaged or interested in the discussion while [name] (Alice) was present in the room. CM will attempt to reevaluate at another time.    Primary Insurance- Payor: MEDICAID [place] / Plan: MEDICAID [place] ACCESS / Product Type: *No Product type* /   Secondary Insurance ??? None  Prescription Coverage ???  medicaid  Preferred Pharmacy - [id number] - [place], [place] - [address] AT [facility] [address]  [place] [facility] - [place], [place] - [address]  [facility] - [place], [place] - [address]  [facility] [phone] - [place], [place] - [address] AT [facility] OF [address]  [place] [facility]  [facility]    Transportation home: Private vehicle  Level of function prior to admission: Independent Pt stated that he ambulates independently and denies any medical equipment needs at this time. He denies therapy services at this time.     Orientation Level: Oriented X4  Who provides care at home?: Family member  Reason for referral: Discharge Planning    Contact/Decision Maker  Extended Emergency Contact Information  Primary Emergency Contact: Havery [place]  Home Phone: [phone]  Mobile Phone: [phone]  Relation: Grandparent  Interpreter needed? No  Secondary Emergency Contact: Stephenie Acres  Mobile Phone: [phone]  Relation: Friend  Preferred language: ENGLISH  Interpreter needed? No  Mother: Nolon Stalls  Mobile Phone: [phone]    Legal Next of Kin / Guardian / POA / Advance Directives     HCDM (patient stated preference): Stephenie Acres - Friend - [phone]    HCDM (patient stated preference): Marja Kays - [phone]   He stated that [name] (listed above) is his HCDM preference. He states that he has received HCDM tools.    Advance Directive (Medical Treatment)  Does patient have an advance directive covering medical treatment?: Patient does not have advance directive covering medical treatment.  Reason patient does not have an advance directive covering medical treatment:: Patient needs follow-up to complete one. CM will follow-up when pt is alone in his room.    Health Care Decision Maker [HCDM] (Medical & Mental Health Treatment)  Healthcare Decision Maker: HCDM documented in the HCDM/Contact  Info section.  Information offered on HCDM, Medical & Mental Health advance directives:: Patient given information.  Referral Made: Other (Comment)    Advance Directive (Mental Health Treatment)  Does patient have an advance directive covering mental health treatment?: Patient does not have advance directive covering mental health treatment.  Reason patient does not have an advance directive covering mental health treatment:: Patient needs follow-up to complete one.    Patient Information  Lives with: Other (Comment)   Alice [place] ([name])    Type of Residence: Private residence   [address] [place] [address]      Support Systems/Concerns: Family Members, Friends/Neighbors, Case Manager/Social Worker    Responsibilities/Dependents at home?: No    Home Care services in place prior to admission?: No          Outpatient/Community Resources in place prior to admission: Clinic   Outpatient SW/transplant following pt.       Equipment Currently Used at Home: none       Currently receiving outpatient dialysis?: No       Financial Information       Need for financial assistance?: Yes  Type of financial assistance required: Social Security/Disability(pt states he's aware of how to apply for SSI)   Unemployed and not in school at this time.    Social Determinants of Health  Social Determinants of Health were addressed in provider documentation.  Please refer to patient history.   Info for this part of the assessment not discussed in detail d/t [name] being present at the bedside at the time. CM will continue to follow and be available for needs.    Discharge Needs Assessment  Concerns to be Addressed: denies needs/concerns at this time, care coordination/care conferences, discharge planning    Clinical Risk Factors: Multiple Diagnoses (Chronic), Readmission < 30 Days    Barriers to taking medications: No    Prior overnight hospital stay or ED visit in last 90 days: Yes     08/04/19-09/10/19-previous hospitalization    Readmission Within the Last 30 Days: previous discharge plan unsuccessful     Anticipated Changes Related to Illness: none    Equipment Needed After Discharge: none    Discharge Facility/Level of Care Needs: other (see comments)   Pt is expected to discharge to [facility] home.    Readmission  Risk of Unplanned Readmission Score: UNPLANNED READMISSION SCORE: 28%  Predictive Model Details           29% (High) Factors Contributing to Score   Calculated 09/19/2019 14:27 29% Number of active Rx orders is 54    Risk of Unplanned Readmission Model 9% Number of ED visits in last six months is 2     7% ECG/EKG order is present in last 6 months     7% Latest calcium is low (8.4 mg/dL)     6% Latest BUN is high (24 mg/dL)     6% Encounter of ten days or longer in last year is present     6% Diagnosis of electrolyte disorder is present     5% Imaging order is present in last 6 months     5% Latest hemoglobin is low (8.8 g/dL)     5% Phosphorous result is present     4% Number of hospitalizations in last year is 1     4% Diagnosis of deficiency anemia is present     3% Active corticosteroid Rx order is present     2% Charlson Comorbidity Index  is 2     2% Future appointment is scheduled     1% Age is 18     1% Current length of stay is 1.372 days     1% Active ulcer medication Rx order is present     Readmitted Within the Last 30 Days? (No if blank) Yes  Patient at risk for readmission?: Yes    Discharge Plan  Screen findings are: Care Manager reviewed the plan of the patient's care with the Multidisciplinary Team. No discharge planning needs identified at this time. Care Manager will continue to manage plan and monitor patient's progress with the team.    Expected Discharge Date: TBD    Expected Transfer from Critical Care: 09/19/19    Patient and/or family were provided with choice of facilities / services that are available and appropriate to meet post hospital care needs?: N/A       Initial Assessment complete?: Yes

## 2019-09-19 NOTE — Unmapped (Signed)
PICU Progress Note    Interval events: Mental status improved throughout night, patient woke up and began eating and interacting. Increased pruritis overnight. Otherwise NAEON.    LOS: 1 days    Assessment/Plan: [name] Gross is a 18 y.o. 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 He was recently hospitalized for acute rejection from 9/24-10/31 complicated by MSSA and strep mitis bacteremia s/p 14 days of IV antibiotics. He is now admitted with fever, fatigue and altered mental status. Overall picture concerning for SBP.    CV: NAI. Echo 11/9 normal with exception of R sided pleural effusion.  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 Concern for SBP on admission. Notable ascites/peripheral edema and R sided pleural effusion. Liver ultrasound with dopplers demonstrating ascites, patent vasculature and mild increase in RI.  -Pediatric GI following, adult hepatology consulted - appreciate reccs  -S/p 0.5mg /kg albumin, 50g albumin today, consider repeat 50g tomorrow  -Stop meropenam and start CTX/Flagyl as below  -Holding tacrolimus per GI  -Continue myfortic 540mg  BID  -Prednisone 40mg  daily  -Protonix 40mg  daily  -Rifaximin 550mg  BID  -Lactulose 30g TID  -MVI, Mag, calcium, Vit D3, NaPhos supplements  -Zofran q8 PRN for nausea  -Regular diet    MELD-Na score: 25 at 09/19/2019  3:51 AM  MELD score: 25 at 09/19/2019  3:51 AM  Calculated from:  Serum Creatinine: 1.22 mg/dL at 16/11/958  4:54 AM  Serum Sodium: 142 mmol/L (Rounded to 137 mmol/L) at 09/19/2019  3:51 AM  Total Bilirubin: 26.7 mg/dL at 07/18/1190  4:78 AM  INR(ratio): 1.46 at 09/18/2019  1:11 PM  Age: 18 years 5 months    ID: Fever tmax at home 103, WBC elevated from prior leukopenia. Held off on LP given patient's AMS began improving overnight. Echo obtained negative for endocarditis. Blood culture NGTD x24hrs, urine culture negative, RPP/COVID negative. Paracentesis cytology not c/w SBP. Recently had strep mitis and MSSA bacteremia s/p 14 days unasyn.   -Adult transplant ID following, appreciate reccs  -F/u blood, urine cultures  -F/u ascitic fluid cultures/AFB  -HHV, HSV, EBV, CMV, crypto Ag, Fungitell   -Obtain HIV, RPR, gonorrhea/chlamydia  -Can consider MRCP if abdominal pain not improving/worsening  -Stop meropenam and start ceftriaxone/flagyl   -Bactrim PPx MWF  -Valgancyclovir PPx daily    RENAL:  Abdominal ultrasound with increased bilateral renal echogenicity. Cr improving with IVF. Likely 2/2 to hypovolemia, supratherapeutic tacrolimus and underlying liver disease.  -BMP daily  -Discontinue IVF  -Additional 50g albumin per GI  -Consider starting lasix/spironolactone this evening    ENDO: Steroid induced hyperglycemia. Continue home insulin regimen.  -Adult endocrinology following, appreciate reccs  -Will restart Lantus??5??units at bedtime given hyperglycemia on AM BMP (typically on 10u lantus nightly at home)  -Nutritional insulin when eating:??Lispro??3-6??units with meals (Hold if NPO)  -Continue Lispro correction 1:50>150 ACHS  -PRN glucose tablet, glucagon for hypoglycemia     NEURO: AMS and asterixis. C/f hepatic encephalopathy.   -Rifaximin 550mg  BID  -Lactulose 30g TID  -Insert NGT if not taking lactulose    DERM: Oral thrush, steroid induced acne and atypical folliculitis.  -Dermatology consult today  -Nystatin swish TID  -Topical clindamycin, benzoyl peroxide, ketoconazole shampoo    SOCIAL: Hx of significant social stressors, patient cut ties with all family members. Appears to be mending the relationship with his parents.  Lives primarily with paternal grandmother. [name] is HCPOA per ACP documentation.    RASS goal: N/A  DVT ppx: N/A     Changes to Lines/Tubes: None   Family communication: Mom, dad and paternal grandma here throughout day   Dispo: Admit to PICU PICU Resident Pager: [phone]  PICU Resident Phone: [phone]    Vitals:  Dosing weight:    Most recent weight: 53.9 kg (118 lb 13.3 oz) (09/18/19 1100)    Afebrile, HDS  Temp:  [36.6 ??C-37 ??C] 36.8 ??C  Heart Rate:  [74-97] 74  SpO2 Pulse:  [65-94] 93  Resp:  [16-28] 16  BP: (98-124)/(54-76) 124/76  MAP (mmHg):  [68-93] 88  SpO2:  [98 %-100 %] 100 %     I/O       11/07 0701 - 11/08 0700 11/08 0701 - 11/09 0700 11/09 0701 - 11/10 0700    P.O.  360 120    I.V. (mL/kg)  2691.3 (49.9) 100 (1.9)    IV Piggyback  300     Total Intake  3351.3 220    Urine (mL/kg/hr)  280 (0.2)     Stool  1200     Total Output(mL/kg)  1480 (27.5)     Net  +1871.3 +220           Stool Occurrence  3 x 1 x           Physical Exam:  General: Overall chronically ill appearing patient in NAD  HEENT: Icteric sclerae, EOMI, PERRLA  CV: RRR, no murmur   Resp: CTAB, no increased WOB  Abd: Distended, soft, slightly TTP, improved from yesterday  Ext: Moving all 4 ext spontaneously. 2-3+ pitting edema in bilateral ankles.  Neuro: A&O x3 but very lethargic and slow to respond    Labs and studies reviewed. Notable for:  WBC 5.8 (6.5 yesterday, 2.2 ~1 week ago)  Cr 1.22 (1.53)  Glucose 175  Albumin 2.4  Total bili 26.7  AST 165  ALT hemolyzed  Alk Phos 832    Lines/Tubes:   Patient Lines/Drains/Airways Status    Active Active Lines, Drains, & Airways     Name:   Placement date:   Placement time:   Site:   Days:    Peripheral IV 09/18/19 Left Forearm   09/18/19    0219    Forearm   1

## 2019-09-19 NOTE — Unmapped (Addendum)
I personally saw [name] Gross with Dr. Kari Baars. Full consult note to follow.     Recommendations:   -Diagnostic Paracentesis given new onset ascites, abdominal pain and fever: send for count, diff, protein, LDH and aerobic/anaerobic cultures, fungal cultures and AFB cultures  -Send CMV, HSV and EBV serum PCRS  -send crytpo ag serum  -send fungitell  -repeat echocardiogram given recent S mitis and MSSA bacteremia  -if worsening abdominal pain consider MRCP to look for biliary stenosis/suggestions of cholangitis  -if worsening mental status, would suggest MRI and LP to evaluate for OI infections  -agree with meropenem, plans to narrow if clinically improving/culture data returns  -continue ppx Valgancyclovir, bactrim PJP ppx and nystatin swish swallow  -if oral candidiasis not improving consider sending yeast screen/fungal culture to isolate organism  -derm consult for concern of atypical folliculitis on face and atypical presentation for tinea versicolor    Vernona Rieger C. Rachal, PGY6  Adult/Pediatric Infectious Diseases Fellow  [facility] Service    _______________________________________________________________________    Attending attestation  I saw and evaluated the patient. I agree with the findings and the plan of care as documented in the fellow???s note.    Jori Moll, MD  Immunocompromised ID  Pager [phone]  _______________________________________________________________________

## 2019-09-19 NOTE — Unmapped (Signed)
NEW IMMUNOCOMPROMISED HOST INFECTIOUS DISEASE CONSULT NOTE      [name] Gross is being seen in consultation at the request of Marni Griffon,* for evaluation of fever, AMS in Liver transplant.       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     Top of ddx for causes of fever and AMS, RUQ abdominal pain with new onset ascites in this patient are peritonitis, cholangitis, viral reactivation syndrome (given recent immunosuppression), or persistent focus of infection resultant from previous bacteremias.    Patient has clinically improved with treatment empirically for intrabdominal source of infection and has negative blood cultures to date but given he has new onset ascites needs paracentesis to confirm diagnosis especially given recent strep mitis bacteremia. Given recent MSSA bacteremia and stents in biliary tree if pain persists, may need additional imaging to r/o cholangitis (such as MRCP).    Given history of CMV viremia/primary infection in 01/2019 needs focused viral assessment for reactivation that can also cause AMS even on ppx. He is at risk for fungal infections given liver disease and recent ATG/steroids, clinical picture does not match PJP however candidemia or cryptococcal infection could be present.     Lastly if MS declines again would recommend LP and MRI to assess for infectious vs. Non-infectious encephalitis.     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 due to presumed [facility]  - Surgical complications: anastamosis issues within post op month 1; treated for cholangitis  - Serologies: CMV D-/R-, EBV D?/R+, Toxo D?/R-  - Induction: steroid  - Immunosuppression: Pred 40, tacro, MMF  - Prophylaxis: valgan, bactrim, nystatin  - Rejection history: 02/15/2018 (moderate acellular), 05/15/2018 (severe rejection; 06/10/19 (humoral rejection; 08/08/2019 (severe, chronic rejection) s/p ATG x 3, methylpred  -  MELD-Na score: 28 at 09/18/2019  4:06 PM  MELD score: 26 at 09/18/2019  4:06 PM  Calculated from:  Serum Creatinine: 1.27 mg/dL at 28/02/1323  4:01 PM  Serum Sodium: 133 mmol/L at 09/18/2019  1:11 PM  Total Bilirubin: 25.3 mg/dL at 12/17/2534  6:44 PM  INR(ratio): 1.46 at 09/18/2019  1:11 PM  Age: 3 years 5 months    Pertinent Exposure History  Recent homelessness    Pertinent Co-morbidities  Depression with multiple admissions for SA in 2019  Steroid induced diabetes  Malnutrition    Drug Intolerances  None    Infection History  #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 CLABSI 08/22/2019  -cleared cultures (CVAD and periphery) by day 2 abx  -vanc/ceftriaxone 10/12-10/16->unasyn 10/16-10/26  -TTE wnl 10/16  -PICC removed at discharge 09/10/19    Recommendations:  Diagnostic Paracentesis given new onset ascites, abdominal pain and fever: send for count, diff, protein, LDH and aerobic/anaerobic cultures, fungal cultures and AFB cultures  -Send CMV, HSV and EBV serum PCRS  -send crytpo ag serum  -send fungitell  -repeat echocardiogram given recent S  mitis and MSSA bacteremia  -if worsening abdominal pain consider MRCP to look for biliary stenosis/suggestions of cholangitis  -if worsening mental status, would suggest MRI and LP to evaluate for OI infections  -agree with meropenem, plans to narrow if clinically improving/culture data returns -continue ppx Valgancyclovir, bactrim PJP ppx and nystatin swish swallow  -if oral candidiasis not improving consider sending yeast screen/fungal culture to isolate organism  -derm consult for concern of atypical folliculitis on face and atypical presentation for tinea versicolor    The [facility] ID service will continue to follow.  Please page the ID Transplant/Liquid Oncology Fellow consult at [phone] with questions.  Patient discussed with Dr. Pamelia Hoit C. Rachal, PGY6  Adult/Pediatric Infectious Disease Fellow  [facility] service    _______________________________________________________________________    Attending attestation  I saw and evaluated the patient. I agree with the findings and the plan of care as documented in the fellow???s note.    Jori Moll, MD  Immunocompromised ID  Pager [phone]  _______________________________________________________________________      History of Present Illness:      Source of information includes:  Electronic Medical Records.  History obtained from:patient and family member.    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     Per chart review patient patient with multiple admissions for presumed cholangitis/biliary obstruction in 10/2016, 08/2017, 05/2018 with history of multiple stent placements but as of most recent ERCP in 05/2018 only one metal stent remains.     Regarding history of infections, patient has been given cipro ppx for ERCP procedures as many resulted in hospital admissions but no bacteria identified. He has had what appears to be primary CMV infection in 01/2019 treated with valgancyclovir x 3 months and now is on ppx; VL undetectable since April 2020.    Patient had 08/22/19 blood cultures from PICC at time and peripheral lines that grew MSSA and strep mitis- PICC removed at day of discharge, cultures cleared in 24 hours, source thought to be line and possibly peritonitis with treatment course of Vanc x 4 days and Amp/Sulbactam x 14 days total. TTE at that time without valvular veg.     Patient treated for chronic rejection with last admission with melena, jaundice on 08/04/19. S/p ATG x 3 on 08/12/19, 08/16/19 and 08/22/19; tapered to 40 mg PO prednisone from methylpred 125 q4 IV x 12 days (ended 08/22/19). Repeat biopsy at discharge with some improvement.    Presented to ER after discharge at end of October on 09/15/19 with AMS and fever to 101 at home. Patient states no diarrhea, emesis but TTP of abdomen with palpation and thinks his belly is bigger. Mom so concerned about mental status brought to hospital, rapid response on 09/18/19 to PICU 2/2 mental status changes however improved by our exam. Described as foggy and wanting to sleep.     Denies cough, HEENT symptoms, sick contacts, chest pain, diarrhea (one episode per mom only) and mom reports patient now has thrush. He has no indwelling lines.    Of note per chart review patient and family have had estranged relationships in past (esp with mom) and patient  had been homeless/living with friend in [place] in earlier October. Now staying at grandmothers.     Allergies:  Allergies   Allergen Reactions   ??? Bee Pollen Rash     Pt has seasonal allergies that cause excessive sneezing and running nose- [name], NAII   ??? Pollen Extracts Rash     Pt has seasonal allergies that cause excessive sneezing and running nose- [name], NAII       Medications:   Current antibiotics:  Meropenem  Valgancyclovir  Bactrim    Previous antibiotics:  Ceftriaxone/Flagyl    Current/Prior immunomodulators:  Prednisone 40  MMF  Tacro    Other medications reviewed.     Medical History:  Past Medical History:   Diagnosis Date   ??? 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 Depression    ??? MSSA bacteremia 08/27/2019   ??? Seasonal allergies    ??? Sickle cell trait (CMS-HCC)    ??? Strep Mitis Central line-associated bloodstream infection 08/27/2019       Surgical History:  Past Surgical History:   Procedure Laterality Date   ??? FRENULECTOMY, LINGUAL     ??? PR ERCP REMOVE FOREIGN BODY/STENT BILIARY/PANC DUCT N/A 08/20/2017    Procedure: ENDOSCOPIC RETROGRADE CHOLANGIOPANCREATOGRAPHY (ERCP); W/ REMOVAL OF FOREIGN BODY/STENT FROM BILIARY/PANCREATIC DUCT(S);  Surgeon: Vonda Antigua, MD;  Location: GI PROCEDURES MEMORIAL [facility];  Service: Gastroenterology   ??? PR ERCP REMOVE FOREIGN BODY/STENT BILIARY/PANC DUCT N/A 06/04/2018    Procedure: ENDOSCOPIC RETROGRADE CHOLANGIOPANCREATOGRAPHY (ERCP); W/ REMOVAL OF FOREIGN BODY/STENT FROM BILIARY/PANCREATIC DUCT(S);  Surgeon: Chriss Driver, MD;  Location: GI PROCEDURES MEMORIAL [facility];  Service: Gastroenterology   ??? PR ERCP STENT PLACEMENT BILIARY/PANCREATIC DUCT N/A 11/21/2016    Procedure: ENDOSCOPIC RETROGRADE CHOLANGIOPANCREATOGRAPHY (ERCP); WITH PLACEMENT OF ENDOSCOPIC STENT INTO BILIARY OR PANCREATIC DUCT;  Surgeon: Vonda Antigua, MD;  Location: GI PROCEDURES MEMORIAL [facility];  Service: Gastroenterology   ??? PR ERCP,W/REMOVAL STONE,BIL/PANCR DUCTS N/A 08/20/2017    Procedure: ERCP; W/ENDOSCOPIC RETROGRADE REMOVAL OF CALCULUS/CALCULI FROM BILIARY &/OR PANCREATIC DUCTS;  Surgeon: Vonda Antigua, MD;  Location: GI PROCEDURES [facility];  Service: Gastroenterology   ??? PR ERCP,W/REMOVAL STONE,BIL/PANCR DUCTS N/A 06/04/2018    Procedure: ERCP; W/ENDOSCOPIC RETROGRADE REMOVAL OF CALCULUS/CALCULI FROM BILIARY &/OR PANCREATIC DUCTS;  Surgeon: Chriss Driver, MD;  Location: GI PROCEDURES [facility];  Service: Gastroenterology   ??? PR TRANSPLANT LIVER,ALLOTRANSPLANT N/A 09/22/2016    Procedure: LIVER ALLOTRANSPLANTATION; ORTHOTOPIC, PARTIAL OR WHOLE, FROM CADAVER OR LIVING DONOR, ANY AGE;  Surgeon: Doyce Loose, MD;  Location: MAIN OR [facility];  Service: Transplant   ??? PR TRANSPLANT,PREP DONOR LIVER, WHOLE N/A 09/22/2016    Procedure: [facility] STD PREP CAD DONOR WHOLE LIVER GFT PRIOR TNSPLNT,INC CHOLE,DISS/REM SURR TISSU WO TRISEG/LOBE SPLT;  Surgeon: Doyce Loose, MD;  Location: MAIN OR [facility];  Service: Transplant   ??? PR UPGI ENDOSCOPY W/US FN BX N/A 08/20/2017    Procedure: UGI W/ TRANSENDOSCOPIC ULTRASOUND GUIDED INTRAMURAL/TRANSMURAL FINE NEEDLE ASPIRATION/BIOPSY(S), ESOPHAGUS;  Surgeon: Vonda Antigua, MD;  Location: GI PROCEDURES [facility]ORIAL [facility];  Service: Gastroenterology   ??? PR UPPER GI ENDOSCOPY,BIOPSY N/A 07/15/2016    Procedure: UGI ENDOSCOPY; WITH BIOPSY, SINGLE OR MULTIPLE;  Surgeon: Arnold Long Mir, MD;  Location: PEDS PROCEDURE ROOM [facility];  Service: Gastroenterology   ??? PR UPPER GI ENDOSCOPY,CTRL BLEED Left 05/05/2016  Procedure: UGI ENDOSCOPY; WITH CONTROL OF BLEEDING, ANY METHOD;  Surgeon: Arnold Long Mir, MD;  Location: PEDS PROCEDURE ROOM [facility];  Service: Gastroenterology   ??? PR UPPER GI ENDOSCOPY,CTRL BLEED N/A 05/12/2016    Procedure: UGI ENDOSCOPY; WITH CONTROL OF BLEEDING, ANY METHOD;  Surgeon: Annie Paras, MD;  Location: CHILDRENS OR [facility];  Service: Gastroenterology       Social History:  Tobacco use:   reports that he has never smoked. He has never used smokeless tobacco.   Alcohol use:    reports no history of alcohol use.   Drug use:    reports no history of drug use.   Living situation:  Lives with family   Residence:   [place]   Birth place  [place]   [place] travel:   Has traveled to [place], [place]   International travel:   Has traveled to [place] on cruise in January   Military service:  Has not served in the [employer]   Employment:  Unemployed   Pets and animal exposure:  No animal exposure   Insect exposure:  No tick exposure   Hobbies:  Denies unusual environmental exposures   TB exposures:  No known TB exposure, No family history of TB and Prior negative TB testing 2017   Sexual history:  Not sexually active however history of male sexual partner in past reported in notes; not confirmed will speak again with patient with parents outside of room   Other significant exposures:  No exposure to well water, No exposure to unpasteurized daily products, No exposure to raw/undercooked foods, No exposure to young children and Prior exposure to blood products last admission     Family History:  no recent sick contacts in family and no history active TB in a family member  Family History   Problem Relation Age of Onset   ??? Diabetes Maternal Grandmother    ??? Diabetes Paternal Grandmother    ??? Hypertension Maternal Grandfather    ??? Hypertension Paternal Grandfather    ??? Asthma Brother    ??? Anesthesia problems Neg Hx    ??? Melanoma Neg Hx    ??? Basal cell carcinoma Neg Hx    ??? Squamous cell carcinoma Neg Hx        Review of Systems:  All other systems reviewed are negative.          Vital Signs last 24 hours:  Temp:  [36.7 ??C-37.7 ??C] 36.7 ??C  Heart Rate:  [76-93] 78  SpO2 Pulse:  [73-90] 73  Resp:  [17-25] 20  BP: (109-132)/(57-77) 109/70  MAP (mmHg):  [80-88] 83  SpO2:  [100 %] 100 %  BMI (Calculated):  [17.6] 17.6    Physical Exam:  Patient Lines/Drains/Airways Status    Active Active Lines, Drains, & Airways     Name:   Placement date:   Placement time:   Site:   Days:    Peripheral IV 09/18/19 Left Forearm   09/18/19    0219    Forearm   less than 1              GEN:  thin, small for age, diffuse rash on face (see derm); constricted affect  EYES: PERRL, EOMI and icteric sclera  AVW:UJWJX thrush throughout mouth with plaques down posterior oropharyxn  LYMPH:no cervical, supraclavicular, axillary, or inguinal LAD  BJ:YNWG are swollen, RRR and no abnormal heart sounds noted  PULM:normal work of breathing at rest, CTAB anteriorly and CTAB posteriorly  GI:+fluid wave with noted mod ascites, mild ttp throughout but especially in RUQ/epigastric region  FU:XNATFTDD  RECTAL:deferred  SKIN:hyperpigmented papules c/f steroid induced acne vs. follicultitis over face (pitryosporoum?); back with scaling and hypopigmentation c/f tinea versicolor  MSK:no vertebral point tenderness, no swollen joints and full neck ROM  NEURO:intention tremor noted but without asterixis, facial expression symmetric and moves extremities equally  PSYCH:attentive, appropriate affect, good eye contact, fluent speech    Labs:    Lab Results   Component Value Date    WBC 6.5 09/18/2019    WBC 6.2 09/15/2019    WBC 2.2 (L) 09/08/2019    WBC 2.8 (L) 02/11/2019    WBC 4.9 02/01/2019    HGB 9.7 (L) 09/18/2019    HGB 13.0 02/11/2019    Hemoglobin 9.6 (L) 09/22/2016    HCT 29.5 (L) 09/18/2019    HCT 40.4 02/11/2019    Platelet 467 (H) 09/18/2019    Platelet 197 02/11/2019    Absolute Neutrophils 5.8 09/18/2019    Absolute Neutrophils 0.6 (L) 02/11/2019    Absolute Lymphocytes 0.2 (L) 09/18/2019    Absolute Lymphocytes 2.0 02/11/2019    Absolute Eosinophils 0.0 09/18/2019    Absolute Eosinophils 0.1 02/11/2019    Sodium 133 (L) 09/18/2019    Sodium 139 02/11/2019    Sodium Whole Blood 138 09/18/2019    Sodium Whole Blood 142 09/22/2016    Potassium 4.3 09/18/2019    Potassium 4.3 02/11/2019    Potassium, Bld 4.1 09/18/2019    Potassium, Bld 4.0 09/22/2016    BUN 24 (H) 09/18/2019    BUN 24 (H) 09/15/2019    Creatinine 1.27 09/18/2019    Creatinine 1.53 (H) 09/18/2019    Creatinine 1.40 (H) 02/11/2019    Creatinine 1.04 02/01/2019    Glucose 191 (H) 09/18/2019    Magnesium 1.0 (L) 09/18/2019    Magnesium 1.7 02/11/2019    Albumin 2.9 (L) 09/18/2019    Total Bilirubin 25.3 (H) 09/18/2019    Total Bilirubin 1.2 02/11/2019    AST 208 (H) 09/18/2019    AST 214 (H) 02/11/2019    ALT  09/18/2019      Comment:      Specimen icteric.    ALT 219 (H) 02/11/2019    Alkaline Phosphatase 782 (H) 09/18/2019    Alkaline Phosphatase 368 (H) 02/11/2019    INR 1.46 09/18/2019    Sed Rate 25 (H) 08/22/2019    Sed Rate 58 (H) 01/21/2019    CRP  09/02/2019      Comment:      Specimen icteric.      CRP 17.9 (H) 08/22/2019    Total IgG 3,407 (H) 08/13/2019    Total IgG 764 07/16/2018    Creatine Kinase, Total 25.0 (L) 07/16/2018       Microbiology:  Past cultures were reviewed in Epic and CareEverywhere.      Imaging:  Independent visualization of images: I independently reviewed the [place] of abdomen with dopplers and I agree with the findings/interpretation.     Serologies:  Lab Results   Component Value Date    CMV IGG Negative 06/19/2018    EBV VCA IgG Antibody Positive (A) 01/07/2018    Hep A IgG Reactive (A) 05/15/2016    Hep B Surface Ag Nonreactive 08/04/2019    Hep B S Ab Reactive (A) 09/21/2016    Hep B Surf Ab Quant 38.11 (H) 09/21/2016    Hepatitis C Ab Nonreactive 08/04/2019    RPR Nonreactive  09/21/2016    HSV 1 IgG Negative 09/21/2016    HSV 2 IgG Negative 09/21/2016    Varicella IgG Positive 09/21/2016    Rubella IgG Scr Positive 05/16/2016    Toxoplasma Gondii IgG Negative 09/21/2016    Quantiferon TB Gold Plus Interpretation Negative 05/22/2016    Quantiferon Mitogen Minus Nil >10.00 05/22/2016    Quantiferon Antigen 1 minus Nil -0.08 05/22/2016       Immunizations:  Immunization History   Administered Date(s) Administered   ??? DTaP 06/12/2000, 10/13/2000, 01/06/2001, 08/16/2001, 06/24/2004   ??? HPV Quadrivalent (Gardasil) 04/17/2011, 05/27/2012, 05/11/2013   ??? Hepatitis A 09/11/2006, 01/23/2009   ??? Hepatitis B vaccine, pediatric/adolescent dosage, 2000/07/13, 04/24/2000, 01/06/2001   ??? HiB-PRP-OMP 06/12/2000, 10/13/2000, 01/06/2001, 05/18/2001   ??? Influenza LAIV (Nasal-Quad) 2-25yrs 09/14/2014   ??? Influenza Vaccine Quad (IIV4 PF) 76mo+ injectable 10/12/2017, 09/10/2019   ??? MMR 05/18/2001, 06/24/2004   ??? Meningococcal Conjugate MCV4P 04/17/2011, 05/22/2016   ??? PPD Test 05/17/2016   ??? Pneumococcal Conjugate 13-Valent 06/12/2000, 10/13/2000, 01/06/2001   ??? Poliovirus, inactivated (IPV) 06/12/2000, 10/13/2000, 05/18/2001, 06/24/2004   ??? TdaP 04/17/2011   ??? Varicella 05/18/2001, 07/24/2005, 09/11/2006

## 2019-09-19 NOTE — Unmapped (Signed)
Dermatology Inpatient Consult Note    Requesting Attending Physician :  Marni Griffon,*  Service Requesting Consult : Pediatric ICU (PMS)  Consulting Attending Physician: Bronson Curb    Reason for Consult: [name] Gross is seen in consultation today by Bronson Curb at the request of Dr. Marni Griffon,* of the Pediatric ICU (PMS) service for evaluation of rash.    Assessment/Recommendations:    Steroid acne  - Improved from prior with primarily post-inflammatory hyperpigmentation today  - Recommend benzaclin daily (may mix benzoyl peroxide and cindamycin lotion if combination unavailable)  - Recommend use of eucerin moisturizer as needed to avoid associated dryness    Thank you for the consult. Dermatology will sign off. Please page [phone] should any additional questions arise or for change in patient's status requiring re-evaluation.  ______________________________________________________________________    History of Present Illness: :  [name] Gross is a 18 y.o. 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  We have been consulted to evaluate rash on face. Believed to be consistent with steroid folliculitis as he has had similar eruptions in the past. Notes that this was previously controlled with use of benzaclin. No associated pain or itch. Much improved overall with remaining pigment spots left behind which bother him. No other skin concerns.    Allergies:  Bee pollen and Pollen extracts    Medications:   Current medication list reviewed in Epic.    Medical History:  Past Medical History:   Diagnosis Date   ??? 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 Depression    ??? MSSA bacteremia 08/27/2019   ??? Seasonal allergies    ??? Sickle cell trait (CMS-HCC)    ??? Strep Mitis Central line-associated bloodstream infection 08/27/2019       Social History:  Social History     Tobacco Use   ??? Smoking status: Never Smoker   ??? Smokeless tobacco: Never Used   Substance Use Topics   ??? Alcohol use: Never     Alcohol/week: 0.0 standard drinks     Frequency: Never     Comment: 1/2 glass of wine on rare occasions   ??? Drug use: Never       Family History:  Negative for chronic skin disease.      Review of Systems:  Pertinent positives in HPI.  All systems were reviewed and were negative unless mentioned in HPI.       Objective: :    Vitals:  Vitals:    09/19/19 1607   BP: 112/70   Pulse: 82   Resp: 22   Temp: 36.8 ??C   SpO2: 100%       Physical Exam:  GEN: Well-appearing in NAD  NEURO: Alert and oriented  SKIN: Examination of the scalp, face, neck, chest, back, abdomen, bilateral upper and lower extremities including palms, soles, and nails was performed today and unremarkable except as below:  - Few monomorphic papules and pustules of face  - Numerous hyperpigmented macules of face    Labs:  Lab Results   Component Value  Date    WBC 5.8 09/19/2019    HGB 8.8 (L) 09/19/2019    HCT 27.0 (L) 09/19/2019    PLT 400 09/19/2019       Lab Results   Component Value Date    NA 142 09/19/2019    K 3.9 09/19/2019    CL 113 (H) 09/19/2019    CO2  09/19/2019      Comment:      Specimen icteric.    BUN 24 (H) 09/19/2019    CREATININE 1.22 09/19/2019    GLU 175 (H) 09/19/2019    CALCIUM 8.4 (L) 09/19/2019    MG 1.6 09/19/2019    PHOS 4.3 09/19/2019       Lab Results   Component Value Date    BILITOT 26.7 (H) 09/19/2019    BILIDIR 28.10 (H) 09/18/2019    PROT  09/19/2019 Comment:      Specimen icteric.    ALBUMIN 2.4 (L) 09/19/2019    ALT  09/19/2019      Comment:      Specimen icteric.    AST 165 (H) 09/19/2019    ALKPHOS 832 (H) 09/19/2019    GGT 504 (H) 09/18/2019       Lab Results   Component Value Date    PT 16.9 (H) 09/18/2019    INR 1.46 09/18/2019    APTT 30.2 09/18/2019
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
## 2019-09-19 NOTE — Unmapped (Signed)
TransplantSurgery Consult Note    Requesting Attending Physician:  Marni Griffon,*  Service Requesting Consult:  Pediatric ICU (PMS)    Assessment:  [name] Gross is a 18 y.o. 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    He was last seen by [place] in 3/19 for SI and acute rejection. He was recently admitted by peds GI and treated for rejection with tacrolimus, cellcept, steroids, thyroglobulin. He also developed MSSA infection and was treated with IV abx.     He was admitted on 11/7 with fatigue, AMS, and AKI.     There is a broad differential including infection, worsening liver rejection, FUO. He is hemodynamically stable but was transferred to the PICU for AMS.    Started on CTX/Flagyl to rule out SBO. We were consulted for recs.    On exam VSS, alert and oriented x 3 but non-participatory. Abdomen is mildly distended with ascites but no peritonitis. Liver US was ordered. Labs show stable hyperbilirubinemia (31.5 total, 28.1 direct). WBC count is 6.5. No covid or respiratory pathogens      PLAN:   - No surgical intervention  - Follow up liver US   - If liver US negative, consider MRCP  - Peds hepatology consult.    If you have any questions, concerns or changes in the patient's clinical status, please feel free to contact [facility] consult pager [phone]. Thank you for this interesting consult.    History of Present Illness:   Chief Complaint:  AMS    [name] Gross is a 18 y.o. male who is seen in consultation for AMS s/p liver transplant at the request of Marni Griffon,* on the Pediatric ICU (PMS) service.     [name] Gross is 18 y.o. 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  He had a recent hospitalization 9/24-10/31 for strep mitis, MSSA, aki, thrombocytopenia, and cholestasis. He was admitted on 11/7 with fatigue, AMS, and AKI. In the time since his last hospitalization he has had progressive fatigue and jaundice. He was also febrile periodically.    He was last seen by [place] in 3/19 for SI and acute rejection. He was recently admitted by peds GI and treated for rejection with tacrolimus, cellcept, steroids, thyroglobulin. He also developed MSSA infection and was treated with IV abx.         Past Medical History:   Past Medical History:   Diagnosis Date   ??? 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 Depression    ??? MSSA bacteremia 08/27/2019   ??? Seasonal allergies    ??? Sickle cell trait (CMS-HCC)    ??? Strep Mitis Central line-associated bloodstream infection 08/27/2019       Past Surgical History:  Past Surgical History:   Procedure Laterality Date   ??? FRENULECTOMY, LINGUAL     ??? PR ERCP REMOVE FOREIGN BODY/STENT BILIARY/PANC DUCT N/A 08/20/2017    Procedure: ENDOSCOPIC RETROGRADE CHOLANGIOPANCREATOGRAPHY (ERCP); W/ REMOVAL OF FOREIGN BODY/STENT FROM BILIARY/PANCREATIC DUCT(S);  Surgeon: Vonda Antigua, MD;  Location: GI [facility]URES [facility];  Service: Gastroenterology   ??? PR ERCP REMOVE  FOREIGN BODY/STENT BILIARY/PANC DUCT N/A 06/04/2018    Procedure: ENDOSCOPIC RETROGRADE CHOLANGIOPANCREATOGRAPHY (ERCP); W/ REMOVAL OF FOREIGN BODY/STENT FROM BILIARY/PANCREATIC DUCT(S);  Surgeon: Chriss Driver, MD;  Location: GI PROCEDURES [facility];  Service: Gastroenterology   ??? PR ERCP STENT PLACEMENT BILIARY/PANCREATIC DUCT N/A 11/21/2016    Procedure: ENDOSCOPIC RETROGRADE CHOLANGIOPANCREATOGRAPHY (ERCP); WITH PLACEMENT OF ENDOSCOPIC STENT INTO BILIARY OR PANCREATIC DUCT;  Surgeon: Vonda Antigua, MD;  Location: GI PROC[facility]URES [facility];  Service: Gastroenterology   ??? PR ERCP,W/REMOVAL STONE,BIL/PANCR DUCTS N/A 08/20/2017    Procedure: ERCP; W/ENDOSCOPIC RETROGRADE REMOVAL OF CALCULUS/CALCULI FROM BILIARY &/OR PANCREATIC DUCTS;  Surgeon: Vonda Antigua, MD;  Location: GI PROC[facility]URES [facility];  Service: Gastroenterology   ??? PR ERCP,W/REMOVAL STONE,BIL/PANCR DUCTS N/A 06/04/2018    Procedure: ERCP; W/ENDOSCOPIC RETROGRADE REMOVAL OF CALCULUS/CALCULI FROM BILIARY &/OR PANCREATIC DUCTS;  Surgeon: Chriss Driver, MD;  Location: GI PROC[facility]URES [facility];  Service: Gastroenterology   ??? PR TRANSPLANT LIVER,ALLOTRANSPLANT N/A 09/22/2016    Procedure: LIVER ALLOTRANSPLANTATION; ORTHOTOPIC, PARTIAL OR WHOLE, FROM CADAVER OR LIVING DONOR, ANY AGE;  Surgeon: Doyce Loose, MD;  Location: MAIN OR [facility];  Service: Transplant   ??? PR TRANSPLANT,PREP DONOR LIVER, WHOLE N/A 09/22/2016    Procedure: [facility] STD PREP CAD DONOR WHOLE LIVER GFT PRIOR TNSPLNT,INC CHOLE,DISS/REM SURR TISSU WO TRISEG/LOBE SPLT;  Surgeon: Doyce Loose, MD;  Location: MAIN OR [facility];  Service: Transplant   ??? PR UPGI ENDOSCOPY W/US FN BX N/A 08/20/2017    Procedure: UGI W/ TRANSENDOSCOPIC ULTRASOUND GUIDED INTRAMURAL/TRANSMURAL FINE NEEDLE ASPIRATION/BIOPSY(S), ESOPHAGUS;  Surgeon: Vonda Antigua, MD;  Location: GI PROC[facility]URES [facility];  Service: Gastroenterology   ??? PR UPPER GI ENDOSCOPY,BIOPSY N/A 07/15/2016    Procedure: UGI ENDOSCOPY; WITH BIOPSY, SINGLE OR MULTIPLE;  Surgeon: Arnold Long Mir, MD;  Location: PEDS PROCEDURE ROOM [facility];  Service: Gastroenterology   ??? PR UPPER GI ENDOSCOPY,CTRL BLEED Left 05/05/2016    Procedure: UGI ENDOSCOPY; WITH CONTROL OF BLEEDING, ANY METHOD;  Surgeon: Arnold Long Mir, MD;  Location: PEDS PROCEDURE ROOM [facility] [place];  Service: Gastroenterology   ??? PR UPPER GI ENDOSCOPY,CTRL BLEED N/A 05/12/2016    Procedure: UGI ENDOSCOPY; WITH CONTROL OF BLEEDING, ANY METHOD;  Surgeon: Annie Paras, MD;  Location: CHILDRENS OR [facility];  Service: Gastroenterology       Medications:  No current facility-administered medications on file prior to encounter.      Current Outpatient Medications on File Prior to Encounter   Medication Sig Dispense Refill   ??? blood sugar diagnostic Strp Use to check blood sugar four times daily as directed. 100 strip 12   ??? blood-glucose meter kit Use as instructed 1 each 0   ??? calcium carbonate (OS-CAL) 1,500 mg (600 mg elem calcium) tablet Take 1/2 tablet (300 mg elem calcium) by mouth Two (2) times a day. (Over the counter med) 60 tablet 0   ??? cholecalciferol, vitamin D3, 125 mcg (5,000 unit) tablet Take 1 tablet (5,000 Units total) by mouth daily. 30 tablet 11   ??? clindamycin-benzoyl peroxide 1.2 %(1 % base) -5 % gel Apply to rash on chest 45 g 0   ??? glucagon 0.5 mg/0.1 mL Syrg Inject 0.1 mL under the skin once as needed for up to 1 dose. 0.2 mL 2   ??? glucose 4 GM chewable tablet Chew 1 tablet (4 g total) 4 (four) times a day as needed for low blood sugar (refer to hypoglycemia guideline). 50 tablet 12   ??? insulin glargine (LANTUS) 100 unit/mL  injection Inject 0.1 mL (10 Units total) under the skin nightly. 10 mL 0   ??? insulin lispro (HUMALOG) 100 unit/mL injection Inject 0.06 mL (6 Units total) under the skin Five (5) times a day and Sliding scale 0 to 10 units under the skin five times a day as directed. 30 mL 0   ??? insulin syr/ndl U100 half mark (BD INSULIN SYRINGE HALF UNIT) 0.3 mL 31 gauge x 5/16 Syrg Use to inject insulins up to 6 times daily as directed. 200 each 12   ??? ketoconazole (NIZORAL) 2 % shampoo Apply topically daily as directed. 120 mL 0   ??? lancets Misc Use to check blood glucose 6 times per day. 204 each 0   ??? magnesium oxide (MAG-OX) 400 mg (241.3 mg magnesium) tablet Take 1 tablet (400 mg total) by mouth daily. 30 tablet 3   ??? melatonin 3 mg Tab Take 1 tablet (3 mg total) by mouth every evening. 60 tablet 0   ??? multivitamin, with zinc (AQUADEKS) 100-5 mcg-mg Chew Chew 2 tablets daily. 60 tablet 0   ??? [EXPIRED] mupirocin (BACTROBAN) 2 % ointment Apply topically Three (3) times a day for 7 days. 22 g 0   ??? mycophenolate (MYFORTIC) 180 MG EC tablet Take 3 tablets (540 mg total) by mouth Two (2) times a day. 180 tablet 11   ??? nystatin (MYCOSTATIN) 100,000 unit/mL suspension Take 10 mls (1,000,000 Units total) by mouth Three (3) times a day. 900 mL 0   ??? [EXPIRED] ondansetron (ZOFRAN-ODT) 4 MG disintegrating tablet Take 1 tablet (4 mg total) by mouth every eight (8) hours as needed for up to 7 days. (Patient not taking: Reported on 09/13/2019) 10 tablet 0   ??? pantoprazole (PROTONIX) 40 MG tablet Take 1 tablet (40 mg total) by mouth daily. 30 tablet 0   ??? polyethylene glycol (MIRALAX) 17 gram packet Take 17 g (1 packet dissolved in 4 to 8 oz. of liquid) by mouth two (2) times a day as needed (constipation). 100 each 0   ??? potassium & sodium phosphates 250mg  (PHOS-NAK/NEUTRA PHOS) 280-160-250 mg PwPk Take 2 packets by mouth Three (3) times a day. 180 packet 0   ??? predniSONE (DELTASONE) 20 MG tablet Take 2 tablets (40 mg total) by mouth daily. 60 tablet 0   ??? rifAXIMin (XIFAXAN) 550 mg Tab Take 1 tablet (550 mg total) by mouth Two (2) times a day. 60 tablet 0   ??? sulfamethoxazole-trimethoprim (BACTRIM) 400-80 mg per tablet Take 1 tablet (80 mg of trimethoprim total) by mouth Every Monday, Wednesday, and Friday. 12 tablet 0   ??? tacrolimus (PROGRAF) 1 MG capsule Take 4 capsules (4 mg) by mouth two (2) times a day. 240 capsule 11   ??? valGANciclovir (VALCYTE) 450 mg tablet Take 1 tablet (450 mg total) by mouth daily. 30 tablet 0   ??? venlafaxine (EFFEXOR-XR) 37.5 MG 24 hr capsule Take 1 capsule (37.5 mg total) by mouth daily. 30 capsule 0   ??? [DISCONTINUED] mirtazapine (REMERON) 15 MG tablet Take 1 tablet (15 mg total) by mouth nightly. 30 tablet 0       Allergies:  Allergies   Allergen Reactions   ??? Bee Pollen Rash     Pt has seasonal allergies that cause excessive sneezing and running nose- [name], NAII   ??? Pollen Extracts Rash     Pt has seasonal allergies that cause excessive sneezing and running nose- [name]       Family History:  Family  History   Problem Relation Age of Onset   ??? Diabetes Maternal Grandmother    ??? Diabetes Paternal Grandmother    ??? Hypertension Maternal Grandfather    ??? Hypertension Paternal Grandfather    ??? Asthma Brother    ??? Anesthesia problems Neg Hx    ??? Melanoma Neg Hx    ??? Basal cell carcinoma Neg Hx    ??? Squamous cell carcinoma Neg Hx        Social History:   Social History     Tobacco Use   ??? Smoking status: Never Smoker   ??? Smokeless tobacco: Never Used   Substance Use Topics   ??? Alcohol use: Never     Alcohol/week: 0.0 standard drinks     Frequency: Never     Comment: 1/2 glass of wine on rare occasions   ??? Drug use: Never       Review of Systems  10 systems were reviewed and are negative except as noted specifically in the HPI.    Objective  Vitals:   Temp:  [36.7 ??C-37.7 ??C] 36.7 ??C  Heart Rate:  [76-93] 78  SpO2 Pulse:  [73-90] 73  Resp:  [17-25] 20  BP: (109-132)/(57-77) 109/70  MAP (mmHg):  [80-88] 83  SpO2:  [100 %] 100 %  BMI (Calculated):  [17.6] 17.6    Intake/Output last 3 shifts:  No intake/output data recorded.    Physical Exam:   Constitutional: poorly responsive, AOx3  Eyes: PERRL, EOM intact, Scleral icterus  Ears, nose, mouth and throat: Moist mucus membranes, no discharge  Neck: Supple, trachea midline, no gross masses  Respiratory: No increased WOB, Symmetrical chest rise, no audible wheeze or stridor  Cardiovascular: Extremities are warm and well perfused, no active bleeding  Gastrointestinal: Abdomen is minimally distended with mild fluid wave. No rigidity, rebound or guarding.  Musculoskeletal: No gross limitations in ROM  Skin: No rashes, lesions, or wounds  Neurologic: Alert. No gross focal neurological deficits  Psychiatric: Does not participate in exam    Most Recent Labs:  Lab Results   Component Value Date    WBC 6.5 09/18/2019    HGB 9.7 (L) 09/18/2019    HCT 29.5 (L) 09/18/2019    PLT 467 (H) 09/18/2019       Lab Results   Component Value Date    NA 138 09/18/2019    K 4.1 09/18/2019    CL 110 (H) 09/18/2019    CO2  09/18/2019      Comment:      Specimen icteric.    BUN 24 (H) 09/18/2019    CREATININE 1.27 09/18/2019    CALCIUM 8.0 (L) 09/18/2019    MG 1.0 (L) 09/18/2019    PHOS 3.6 09/18/2019       Imaging:  Xr Chest Portable    Result Date: 09/18/2019  EXAM: XR CHEST PORTABLE DATE: 09/18/2019 2:46 AM ACCESSION: [id number] UN DICTATED: 09/18/2019 3:36 AM INTERPRETATION LOCATION: [facility] CLINICAL INDICATION: 18 years old Male with FEVER  COMPARISON: Chest Radiograph dated 08/22/2019, priors TECHNIQUE: Portable Chest Radiograph. FINDINGS: Interval removal of right PICC. Lungs radiographically clear. Unchanged elevation of the left hemidiaphragm. No pleural effusion or pneumothorax. Interval resolution of right basilar pleural effusion. No cardiomegaly.     No acute airspace disease.    Ct Head Wo Contrast    Result Date: 09/18/2019  EXAM: Computed tomography, head or brain without contrast material. DATE: 09/18/2019 1:51 PM ACCESSION: [id number] UN DICTATED: 09/18/2019 1:52 PM INTERPRETATION LOCATION: [employer]  CLINICAL INDICATION: 18 years old Male with encephalopathy  COMPARISON: None available. TECHNIQUE: Axial CT images of the head  from skull base to vertex without contrast. FINDINGS: There is no midline shift. No mass lesion. There is no evidence of acute infarct. No acute intracranial hemorrhage. No fractures are evident. The sinuses are pneumatized.     No acute intracranial abnormality.    US Liver Transplant    Result Date: 09/18/2019  EXAM: US LIVER TRANSPLANT DATE: 09/18/2019 3:25 PM ACCESSION: [id number] UN DICTATED: 09/18/2019 3:28 PM INTERPRETATION LOCATION: [facility] CLINICAL INDICATION: 18 year old Male with ascites, s/p transplant, w/ doppler  TECHNIQUE: Ultrasound views of the complete abdomen were obtained using gray scale and color and spectral Doppler imaging. COMPARISON: Liver transplant ultrasound dated 09/04/2019 and prior. FINDINGS: HEPATOBILIARY: The liver measures 15.4 cm and demonstrates heterogeneous/increased parenchymal echogenicity. Mild periportal edema. Heterogeneity limits evaluation for focal hepatic lesions. No intrahepatic biliary ductal dilatation. The common bile duct is normal in caliber, measuring 2.6 mm. The gallbladder is surgically absent. PANCREAS: Visualized portion is unremarkable. SPLEEN: The spleen measures 12.2 cm. Small splenule noted. KIDNEYS: Normal in size with increased echogenicity The right kidney measures 11.2 cm and the left kidney measures 12.8 cm. No solid masses or calculi. No hydronephrosis. VESSELS: - Portal vein: The main portal vein diameter measures 1.0 cm. The main, left and right portal veins are patent with hepatopetal flow. Main portal vein pre anastomosis velocity: 0.46 m/s (previously 0.57) Main portal vein anastomosis velocity: 0.5 m/s (previously 0.52) Main portal vein post anastomosis velocity: 0.56 m/s (previously 0.54) Anterior right portal vein velocity: 0.24 m/s (previously 0.29) Posterior right portal vein velocity: 0.36 m/s (previously 0.48) Left portal vein velocity: 0.46 m/s (previously 0.46) For reference, normal main portal vein velocity is 0.20 m/s or greater. - Splenic vein: Patent, with hepatopetal flow. - Hepatic veins/IVC: The IVC, left, middle and right hepatic veins are patent with biphasic waveforms. - Hepatic artery: Patent with resistive indices within normal limits. Common hepatic artery resistive index: 0.70 and systolic acceleration time 33 msec (previously 0.5 and 16) Right hepatic artery resistive index: 0.63 and systolic acceleration time 38 msec (previously 0.40 and 55) Left hepatic artery resistive index: 0.68 and systolic acceleration time 42 msec (previously 0.64 and 27) - Visualized proximal aorta:  unremarkable OTHER: Large volume ascites. Right pleural effusion. Distended urinary bladder.     -- Increased and heterogeneous hepatic parenchymal echogenicity which is nonspecific, however consistent with chronic liver disease. Mild periportal edema. -- Patent hepatic transplant vasculature. -- Mild interval increase in resistive indices in the hepatic transplant arteries, within normal limits. -- Increased bilateral renal echogenicity which is nonspecific, however can be seen in medical renal disease. -- Overall stable to mildly decreased portal vein velocities, within normal limits. -- Large volume ascites. -- Right pleural effusion.     ____________________  Sanjuana Kava, MD  General Surgery, PGY2

## 2019-09-19 NOTE — Unmapped (Addendum)
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     Top of ddx for causes of fever and AMS, RUQ abdominal pain with new onset ascites in this patient are peritonitis, cholangitis, viral reactivation syndrome (given immunosuppression), or persistent focus of infection resultant from previous bacteremias.    Patient has clinically improved with treatment empirically for intrabdominal source of infection and has negative blood cultures to date; however paracentesis wnl--if peritonitis was underlying cause we would still expect to find some inflammation 48 hours into abx. We do have concerns about cholangitis but is clinically improving on abx; would consider additional imaging but will defer to hepatology. Especially given recent strep mitis bacteremia/strep mitis bacteremia and stents in biliary tree if pain persists, may need additional imaging to r/o cholangitis (such as MRCP).    Given history of CMV viremia/primary infection in 01/2019 needs focused viral assessment for reactivation that can also cause AMS even on ppx. He is at risk for fungal infections given liver disease and recent ATG/steroids, clinical picture does not match PJP however candidemia or cryptococcal infection could be present.     Lastly if MS declines again would recommend LP and MRI to assess for infectious vs. Non-infectious encephalitis.     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 due to presumed [facility]  - Surgical complications: anastamosis issues within post op month 1; treated for cholangitis  - Serologies: CMV D-/R-, EBV D?/R+, Toxo D?/R-  - Induction: steroid  - Immunosuppression: Pred 40, tacro, MMF  - Prophylaxis: valgan, bactrim, nystatin  - Rejection history: 02/15/2018 (moderate acellular), 05/15/2018 (severe rejection; 06/10/19 (humoral rejection; 08/08/2019 (severe, chronic rejection) s/p ATG x 3, methylpred  -  MELD-Na score: 28 at 09/18/2019  4:06 PM  MELD score: 26 at 09/18/2019  4:06 PM  Calculated from:  Serum Creatinine: 1.27 mg/dL at 16/11/958  4:54 PM  Serum Sodium: 133 mmol/L at 09/18/2019  1:11 PM  Total Bilirubin: 25.3 mg/dL at 07/18/1190  4:78 PM  INR(ratio): 1.46 at 09/18/2019  1:11 PM  Age: 75 years 5 months    Pertinent Exposure History  Recent homelessness    Pertinent Co-morbidities  Depression with multiple admissions for SA in 2019  Steroid induced diabetes  Malnutrition    Drug Intolerances  None    Infection History  #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 CLABSI 08/22/2019  -cleared cultures (CVAD and periphery) by day 2 abx  -vanc/ceftriaxone 10/12-10/16->unasyn 10/16-10/26  -TTE wnl 10/16  -PICC removed at discharge 09/10/19    Recommendations:    For fever in immunocompromised host  -F/u Send CMV, HSV and EBV serum PCRS  -F/u crytpo ag serum  -F/u fungitell  -Recommend thoracentesis to eval for source of fever, please send for cell  count and chemistry plus bacterial, AFB, and fungal cultures    For history of MSSA and strep mitis bacteremia  --F/u repeat echocardiogram given recent S mitis and MSSA bacteremia    If worsening mental status, would suggest MRI and LP to evaluate for OI infections    For concern for abdominal source of infection:  - Stop Meropenem; switch back Ceftriaxone 1g IV q24 + Flagyl 500 mg PO TID  - MRCP for workup of possible cholangitis however per discussion with hepatology unlikely to proceed to ERCP and patient does NOT have stents in place    For oral candidiasis  -agree with continuing nystatin, noted improvement  -if worsening, please send fungal culture    For skin changes:   -derm consult for concern of atypical folliculitis on face and atypical presentation for tinea versicolor    For ppx in immunocompromised host:  -Continue Valagancyclovir ppx  -continue bactrim ppx    For other pre-transplant workup:  -to follow in note; for now would recommend to send HIV, RPR and gonorrhea/chlamydia testing from urine    The [facility] ID service will continue to follow.  Please page the ID Transplant/Liquid Oncology Fellow consult at [phone] with questions.  Patient discussed with Dr. Pamelia Hoit C. Rachal, PGY6  Adult/Pediatric Infectious Disease Fellow  ICID service    _______________________________________________________________________    Attending attestation  I saw and evaluated the patient. I agree with the findings and the plan of care as documented in the fellow???s note.    OLT 2017 s/p multiple rejection episodes with recent ATG x 4 for rejection, now withy RUQ pain, fever, AMS and increase in bili c/for cholangitis despite neg ultrasound, no evidence of SBP, improving on IV abx (11/7-present). Today without AMS and no longer with abdominal pain.     Vitals: Afeb, normal VS  Labs: WBC 5.8, Cr 1.22, Tbili 26.7 (continues to rise), AST 165 (improving), Alk phos 832).  Micro:   11/8 Bld cx ngtd   11/9 Paracentesis PMNs <250, cultures pending  11/9 HSV PCR neg, CMV, EBV, fungitell, crypto Ag pending   HIV, Gon/Chl, Syphilis screen pending    Rad:  11/9 TTE neg for vegetations, R pleural effusion noted  11/8 RUQ ultrasound w increased heterogenous hepatic echogenicity and mild periportal edema, large volume ascites, R pleural effusion    Plan:   Recommend MRCP to eval for cholangitis given presentation and prior stenting  Mero-->CTX +Flagyl for ~7 days  Thoracentesis for eval of pleural fluid  Pre-transplant ICID eval      Jori Moll, MD  Immunocompromised ID  Pager [phone]  _______________________________________________________________________    Subjective  Afebrile  Mental status stable  Grandmother at bedside  He is taking his nystatin  Had paracentesis this am - 61 nucleated fluid no c/w peritonitis; SAAG 5 g/L c/w portal HTN  Eating ok, NPO overnight for procedure  Abdominal pain is about same per patient.     Allergies:  Allergies   Allergen Reactions   ??? Bee Pollen Rash     Pt has seasonal allergies that cause excessive sneezing and running nose- [name], NAII   ??? Pollen Extracts Rash     Pt has seasonal allergies that cause excessive sneezing and running nose- [name], NAII       Medications:   Current antibiotics:  Meropenem  Valgancyclovir  Bactrim    Previous antibiotics:  Ceftriaxone/Flagyl    Current/Prior immunomodulators:  Prednisone 40  MMF  Tacro  Review of Systems:  All other systems reviewed are negative.          Vital Signs last 24 hours:  Temp:  [36.6 ??C-37.7 ??C] 36.7 ??C  Heart Rate:  [76-97] 82  SpO2 Pulse:  [65-94] 83  Resp:  [17-28] 21  BP: (98-121)/(54-77) 102/54  MAP (mmHg):  [68-93] 68  SpO2:  [98 %-100 %] 100 %    Physical Exam:  Patient Lines/Drains/Airways Status    Active Active Lines, Drains, & Airways     Name:   Placement date:   Placement time:   Site:   Days:    Peripheral IV 09/18/19 Left Forearm   09/18/19    0219    Forearm   1              GEN:  thin, small for age, diffuse rash on face (see derm); constricted affect  EYES: PERRL, EOMI and icteric sclera  ZOX:WRUEAVWU thrush with no other oral lesions  LYMPH:no cervical, supraclavicular, axillary, or inguinal LAD  JW:JXBJ are swollen, RRR and no abnormal heart sounds noted  PULM:normal work of breathing at rest, CTAB anteriorly and CTAB posteriorly  GI:+fluid wave with noted mod ascites, mild ttp throughout but especially in RUQ/epigastric region however improved today  YN:WGNFAOZH  RECTAL:deferred  SKIN:hyperpigmented papules c/f steroid induced acne vs. follicultitis over face (pitryosporoum?); back with scaling and hypopigmentation c/f tinea versicolor  MSK:no vertebral point tenderness, no swollen joints and full neck ROM  NEURO:intention tremor noted but without asterixis, facial expression symmetric and moves extremities equally  PSYCH:attentive, appropriate affect, good eye contact, fluent speech    Labs:    Lab Results   Component Value Date    WBC 5.8 09/19/2019    WBC 6.5 09/18/2019    WBC 6.2 09/15/2019    WBC 2.8 (L) 02/11/2019    WBC 4.9 02/01/2019    HGB 8.8 (L) 09/19/2019    HGB 13.0 02/11/2019    Hemoglobin 9.6 (L) 09/22/2016    HCT 27.0 (L) 09/19/2019    HCT 40.4 02/11/2019    Platelet 400 09/19/2019    Platelet 197 02/11/2019    Absolute Neutrophils 4.1 09/19/2019    Absolute Neutrophils 0.6 (L) 02/11/2019    Absolute Lymphocytes 1.3 (L) 09/19/2019    Absolute Lymphocytes 2.0 02/11/2019    Absolute Eosinophils 0.0 09/19/2019    Absolute Eosinophils 0.1 02/11/2019    Sodium 142 09/19/2019    Sodium 139 02/11/2019    Sodium Whole Blood 142 09/22/2016    Potassium 3.9 09/19/2019    Potassium 4.3 02/11/2019    Potassium, Bld 4.0 09/22/2016    BUN 24 (H) 09/19/2019    BUN 24 (H) 09/15/2019    Creatinine 1.22 09/19/2019    Creatinine 1.27 09/18/2019    Creatinine 1.40 (H) 02/11/2019    Creatinine 1.04 02/01/2019    Glucose 175 (H) 09/19/2019    Magnesium 1.6 09/19/2019    Magnesium 1.7 02/11/2019    Albumin 2.4 (L) 09/19/2019    Total Bilirubin 26.7 (H) 09/19/2019    Total Bilirubin 1.2 02/11/2019    AST 165 (H) 09/19/2019    AST 214 (H) 02/11/2019    ALT  09/19/2019      Comment:      Specimen icteric.    ALT 219 (H) 02/11/2019    Alkaline Phosphatase 832 (H) 09/19/2019    Alkaline Phosphatase 368 (H) 02/11/2019 INR 1.46 09/18/2019    Sed Rate 25 (H) 08/22/2019    Sed  Rate 58 (H) 01/21/2019    CRP  09/02/2019      Comment:      Specimen icteric.      CRP 17.9 (H) 08/22/2019    Total IgG 3,407 (H) 08/13/2019    Total IgG 764 07/16/2018    Creatine Kinase, Total 25.0 (L) 07/16/2018       Microbiology:  Past cultures were reviewed in Epic and CareEverywhere.      Imaging:  Independent visualization of images: I independently reviewed the [place] of abdomen with dopplers and I agree with the findings/interpretation.

## 2019-09-19 NOTE — Unmapped (Signed)
Heru remains in PICU this shift. Afebrile with stable vitals. Neuro status much improved and only disoriented to time (I.e. date is incorrect.) Clear sentences sometimes low volume PERRL. Went from lethargic to drowsy to clear once awake. Sclera jaundice. In good spirits overall- very polite. Room air- no desats. Edema in feet. Tolerated reg diet until NPO at midnight. Belly with gross ascites. Diarrhea that appeared to cluster around lactulose dose. Void x1. NS at 100 ml/hr. Overall weak, but stronger as shift progressed. Slept well overnight. WCTM.    Grandmother at bedside overnight. Multiple family members in to see pt in the evening. Questions and concerns addressed.     Problem: Adult Inpatient Plan of Care  Goal: Plan of Care Review  Outcome: Ongoing - Unchanged  Goal: Patient-Specific Goal (Individualization)  Outcome: Ongoing - Unchanged  Goal: Absence of Hospital-Acquired Illness or Injury  Outcome: Ongoing - Unchanged  Goal: Optimal Comfort and Wellbeing  Outcome: Progressing  Goal: Readiness for Transition of Care  Outcome: Progressing  Goal: Rounds/Family Conference  Outcome: Ongoing - Unchanged     Problem: Self-Care Deficit  Goal: Improved Ability to Complete Activities of Daily Living  Outcome: Progressing     Problem: Fall Injury Risk  Goal: Absence of Fall and Fall-Related Injury  Outcome: Ongoing - Unchanged     Problem: Wound  Goal: Optimal Wound Healing  Outcome: Ongoing - Unchanged     Problem: Adjustment to Illness (Liver Failure)  Goal: Optimal Coping with Liver Failure  Outcome: Ongoing - Unchanged     Problem: Neurologic Function Impaired (Liver Failure)  Goal: Optimal Neurologic Function  Outcome: Progressing     Problem: Oral Intake Inadequate (Liver Failure)  Goal: Optimal Nutrition Intake  Outcome: Progressing     Problem: Respiratory Compromise (Liver Failure)  Goal: Effective Oxygenation and Ventilation  Outcome: Resolved

## 2019-09-20 LAB — CMV DNA, QUANTITATIVE, PCR
CMV QUANT LOG10: 2.28 {Log_IU}/mL — ABNORMAL HIGH (ref <0.00)
CMV QUANT: 190 [IU]/mL — ABNORMAL HIGH (ref <0)

## 2019-09-20 LAB — CBC W/ AUTO DIFF
BASOPHILS ABSOLUTE COUNT: 0 10*9/L (ref 0.0–0.1)
BASOPHILS RELATIVE PERCENT: 0.5 %
EOSINOPHILS ABSOLUTE COUNT: 0 10*9/L (ref 0.0–0.4)
EOSINOPHILS RELATIVE PERCENT: 0.1 %
HEMATOCRIT: 26.5 % — ABNORMAL LOW (ref 41.0–53.0)
HEMOGLOBIN: 8.6 g/dL — ABNORMAL LOW (ref 13.5–17.5)
LARGE UNSTAINED CELLS: 2 % (ref 0–4)
LYMPHOCYTES ABSOLUTE COUNT: 0.3 10*9/L — ABNORMAL LOW (ref 1.5–5.0)
LYMPHOCYTES RELATIVE PERCENT: 4.8 %
MEAN CORPUSCULAR HEMOGLOBIN CONC: 32.3 g/dL (ref 31.0–37.0)
MEAN CORPUSCULAR HEMOGLOBIN: 30.8 pg (ref 26.0–34.0)
MEAN CORPUSCULAR VOLUME: 95.4 fL (ref 80.0–100.0)
MEAN PLATELET VOLUME: 9.2 fL (ref 7.0–10.0)
MONOCYTES ABSOLUTE COUNT: 0.5 10*9/L (ref 0.2–0.8)
NEUTROPHILS ABSOLUTE COUNT: 5.4 10*9/L (ref 2.0–7.5)
NEUTROPHILS RELATIVE PERCENT: 84.7 %
RED BLOOD CELL COUNT: 2.78 10*12/L — ABNORMAL LOW (ref 4.50–5.90)
RED CELL DISTRIBUTION WIDTH: 25.9 % — ABNORMAL HIGH (ref 12.0–15.0)
WBC ADJUSTED: 6.4 10*9/L (ref 4.5–11.0)

## 2019-09-20 LAB — CMV VIRAL LD: Lab: 0

## 2019-09-20 LAB — COMPREHENSIVE METABOLIC PANEL
ALBUMIN: 3.1 g/dL — ABNORMAL LOW (ref 3.5–5.0)
ALKALINE PHOSPHATASE: 771 U/L — ABNORMAL HIGH (ref 65–260)
ALT (SGPT): 239 U/L — ABNORMAL HIGH (ref <50)
AST (SGOT): 150 U/L — ABNORMAL HIGH (ref 19–55)
BILIRUBIN TOTAL: 29.7 mg/dL — ABNORMAL HIGH (ref 0.0–1.2)
BLOOD UREA NITROGEN: 24 mg/dL — ABNORMAL HIGH (ref 7–21)
BUN / CREAT RATIO: 18
CHLORIDE: 114 mmol/L — ABNORMAL HIGH (ref 98–107)
CREATININE: 1.37 mg/dL — ABNORMAL HIGH (ref 0.70–1.30)
EGFR CKD-EPI AA MALE: 86 mL/min/{1.73_m2} (ref >=60)
EGFR CKD-EPI NON-AA MALE: 74 mL/min/{1.73_m2} (ref >=60)
GLUCOSE RANDOM: 125 mg/dL (ref 70–179)
POTASSIUM: 3.8 mmol/L (ref 3.5–5.0)
SODIUM: 138 mmol/L (ref 135–145)

## 2019-09-20 LAB — AMMONIA: Ammonia:SCnc:Pt:Plas:Qn:: 19

## 2019-09-20 LAB — FUNGITELL: 1,3 beta glucan:MCnc:Pt:Ser:Qn:: 98 — AB

## 2019-09-20 LAB — HEPARIN CORRELATION: Lab: 0.3

## 2019-09-20 LAB — D-DIMER QUANTITATIVE (CH,ML,PD,ET): Fibrin D-dimer DDU:MCnc:Pt:PPP:Qn:: 713 — ABNORMAL HIGH

## 2019-09-20 LAB — EBV VIRAL LOAD RESULT: Lab: NOT DETECTED

## 2019-09-20 LAB — EBV QUANTITATIVE PCR, BLOOD: EBV VIRAL LOAD RESULT: NOT DETECTED

## 2019-09-20 LAB — BILIRUBIN DIRECT: Bilirubin.glucuronidated+Bilirubin.albumin bound:MCnc:Pt:Ser/Plas:Qn:: 27.2 — ABNORMAL HIGH

## 2019-09-20 LAB — GAMMA GLUTAMYL TRANSFERASE: Gamma glutamyl transferase:CCnc:Pt:Ser/Plas:Qn:: 374 — ABNORMAL HIGH

## 2019-09-20 LAB — AST (SGOT): Aspartate aminotransferase:CCnc:Pt:Ser/Plas:Qn:: 150 — ABNORMAL HIGH

## 2019-09-20 LAB — SYPHILIS RPR SCREEN: Reagin Ab:PrThr:Pt:Ser:Ord:RPR: NONREACTIVE

## 2019-09-20 LAB — INR: Coagulation tissue factor induced.INR:RelTime:Pt:PPP:Qn:Coag: 1.6

## 2019-09-20 LAB — MAGNESIUM: Magnesium:MCnc:Pt:Ser/Plas:Qn:: 1.4 — ABNORMAL LOW

## 2019-09-20 LAB — MEAN CORPUSCULAR HEMOGLOBIN: Lab: 30.8

## 2019-09-20 LAB — PHOSPHORUS: Phosphate:MCnc:Pt:Ser/Plas:Qn:: 3.7

## 2019-09-20 LAB — HIV ANTIGEN/ANTIBODY COMBO: HIV 1+2 Ab+HIV1 p24 Ag:PrThr:Pt:Ser/Plas:Ord:IA: NONREACTIVE

## 2019-09-20 NOTE — Unmapped (Signed)
Afebrile.  VSS.  Paracentesis done this am at bedside.  Tolerated well.  Still drowsy, but easily arousable and oriented X4.  No acute events this shift.  Patient and grandmother updated at  bedside.    Problem: Adult Inpatient Plan of Care  Goal: Plan of Care Review  Outcome: Progressing  Goal: Patient-Specific Goal (Individualization)  Outcome: Progressing  Goal: Absence of Hospital-Acquired Illness or Injury  Outcome: Progressing  Goal: Optimal Comfort and Wellbeing  Outcome: Progressing  Goal: Readiness for Transition of Care  Outcome: Progressing  Goal: Rounds/Family Conference  Outcome: Progressing     Problem: Self-Care Deficit  Goal: Improved Ability to Complete Activities of Daily Living  Outcome: Progressing     Problem: Fall Injury Risk  Goal: Absence of Fall and Fall-Related Injury  Outcome: Progressing     Problem: Wound  Goal: Optimal Wound Healing  Outcome: Progressing     Problem: Adjustment to Illness (Liver Failure)  Goal: Optimal Coping with Liver Failure  Outcome: Progressing     Problem: Neurologic Function Impaired (Liver Failure)  Goal: Optimal Neurologic Function  Outcome: Progressing     Problem: Oral Intake Inadequate (Liver Failure)  Goal: Optimal Nutrition Intake  Outcome: Progressing

## 2019-09-20 NOTE — Unmapped (Signed)
GASTROENTEROLOGY INPATIENT FOLLOW UP NOTE     Requesting Attending Physician:  Morrison Old, MD    Reason for Consult:  [name] Gross is a 18 y.o. male seen in consultation at the request of Dr. Morrison Old, MD for hx [facility] cirrhosis.    Interval History:  No documented fever while at [facility]. Remains HDS.  Patient much more alert and interactive today. Less ascites. UOP 770cc by this morning after 40mg  IV lasix  Cr peaked 1.8, 1.3 today which is slightly increased after 40mg  lasix  Remains on ceftriaxone and flagyl    - UE doppler with acute DVT in brachial vein. Evidence of acute superficial venous obstruction in the basilic vein. Acute obstruction in the axillary vein.  - LE doppler negative  - TTE normal without vegetation. New large R sided pleural effusion noted.  - Liver doppler with patent portal vasculature. 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  Recent admission with MSSA and strep mitis bacteremia.  No obvious source for fevers and AMS although is improving with holding tacro and treating with antibiotics. He does not have indwelling stents in place currently with last ERCP July 2019.  Current LFT pattern near baseline with his acute on chronic rejection.  ??  MELD-Na score: 28 at 09/20/2019  3:54 AM  MELD score: 28 at 09/20/2019  3:54 AM  Calculated from:  Serum Creatinine: 1.37 mg/dL at 16/08/9603  5:40 AM  Serum Sodium: 138 mmol/L (Rounded to 137 mmol/L) at 09/20/2019  3:54 AM  Total Bilirubin: 29.7 mg/dL at 98/09/9146  8:29 AM  INR(ratio): 1.60 at 09/20/2019  3:54 AM  Age: 6 years 5 months  ??  His MELD is stable and further transplant requirements will depend on his ability to demonstrate medical compliance and stable social support.  Will discuss admission at transplant conference on Wednesday 11/11.     He has no fevers since being at home and AMS has improved. No abdominal pain. No infectious source identified as of yet.  Did have a clot of unknown chronicity given previous PICC placement int he right arm although clot burden is extensive.  Could be source of fever. Additional infectious workup still pending.    With new clots there is a need for possible anticoagulation.  Prior to therapeutic anticoagulation would recommend EGD to assess for varices.  If high risk stigmata or grade III varices will band.  If grade II or I will treat with NSBB.  ??  Recommendations:  - NPO at midnight for EGD 11/11  - check tacrolimus level tomorrow morning. 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 Further immunosuppression recommendations per pediatric hepatology team  - would use lactulose for bowel regimen rather than Miralax given hx of hepatic encephalopathy and also being treated with rifaxamin  - appreciate ID recommendations for antibiotics and additional infectious workup. If AMS again, agree with LP recommendations  - can start low dose diuretics when creatinine improves  - can give albumin if needed for volume re-expansion rather than NS  -  low salt diet given volume overload  - no indication to trend ammonia  ??  Please page [phone] for further questions/concerns. Patient seen and discussed with Dr. Sherryll Burger.  ??  Orion Crook. Annelisa Ryback, MD MPH  Gastroenterology Fellow         Medications:  Current Facility-Administered Medications   Medication Dose Route Frequency Provider Last Rate Last Dose   ??? benzoyl peroxide 5 % gel   Topical Nightly Randall Hiss, MD       ??? calcium carbonate (OS-CAL) tablet 300 mg elem calcium  300 mg elem calcium Oral BID Randall Hiss, MD   300 mg elem calcium at 09/20/19 0935   ??? cefTRIAXone dilution (ROCEPHIN) 40 mg/mL injection 1 g  1 g Intravenous Q24H Randall Hiss, MD 50 mL/hr at 09/19/19 1902 1 g at 09/19/19 1902   ??? cholecalciferol (vitamin D3) tablet 5,000 Units  5,000 Units Oral Daily Randall Hiss, MD   5,000 Units at 09/20/19 0934   ??? clindamycin (CLEOCIN T) 1 % lotion   Topical BID Randall Hiss, MD       ??? dextrose 50 % in water (D50W) 50 % solution 12.5 g  12.5 g Intravenous Q10 Min PRN Randall Hiss, MD       ??? glucose chewable tablet 4 g  4 g Oral 4x Daily PRN Randall Hiss, MD       ??? insulin glargine (LANTUS) injection 5 Units  5 Units Subcutaneous Nightly Randall Hiss, MD   5 Units at 09/19/19 2126   ??? insulin lispro (HumaLOG) injection 2-4 Units  2-4 Units Subcutaneous 4x Daily Ejiofor A Lucky Cowboy., MD       ??? insulin regular (HumuLIN,NovoLIN) injection 0-2.5 Units  0-2.5 Units Subcutaneous Nightly Ejiofor A Lucky Cowboy., MD       ??? insulin regular (HumuLIN,NovoLIN) injection 0-5 Units  0-5 Units Subcutaneous TID AC Ejiofor A Lucky Cowboy., MD       ??? ketoconazole (NIZORAL) 2 % shampoo   Topical Daily Randall Hiss, MD       ??? magnesium oxide (MAG-OX) tablet 400 mg  400 mg Oral Daily Randall Hiss, MD   400 mg at 09/20/19 0935   ??? melatonin tablet 3 mg  3 mg Oral QPM Randall Hiss, MD   3 mg at 09/19/19 2129   ??? metroNIDAZOLE (FLAGYL) tablet 500 mg  500 mg Oral TID Randall Hiss, MD   500 mg at 09/20/19 1423   ??? multivitamin, with zinc (AQUADEKS) chewable tablet  2 tablet Oral Daily Randall Hiss, MD   2 tablet at 09/20/19 4540   ??? mupirocin (BACTROBAN) 2 % ointment   Topical TID Randall Hiss, MD       ??? mycophenolate (MYFORTIC) EC tablet 540 mg  540 mg Oral BID Randall Hiss, MD   540 mg at 09/20/19 9811   ??? nystatin (MYCOSTATIN) oral suspension 1,000,000 Units Oral TID Randall Hiss, MD   1,000,000 Units at 09/20/19 1423   ??? ondansetron (ZOFRAN-ODT) disintegrating tablet 4 mg  4 mg Oral Q8H PRN Randall Hiss, MD       ??? pantoprazole (PROTONIX) EC tablet 40 mg  40 mg Oral Daily Randall Hiss, MD   40 mg at 09/20/19 9147   ??? polyethylene glycol (MIRALAX) packet 17 g  17 g Oral BID PRN Randall Hiss, MD       ??? potassium & sodium  phosphates 250mg  (PHOS-NAK/NEUTRA PHOS) packet 2 packet  2 packet Oral TID Randall Hiss, MD   2 packet at 09/20/19 1423   ??? predniSONE (DELTASONE) tablet 40 mg  40 mg Oral Daily Randall Hiss, MD   40 mg at 09/20/19 0900   ??? rifAXIMin (XIFAXAN) tablet 550 mg  550 mg Oral BID Randall Hiss, MD   550 mg at 09/20/19 0935   ??? sulfamethoxazole-trimethoprim (BACTRIM) 400-80 mg tablet 80 mg of trimethoprim  1 tablet Oral Q MWF Randall Hiss, MD   80 mg of trimethoprim at 09/19/19 0904   ??? valGANciclovir (VALCYTE) tablet 450 mg  450 mg Oral Daily Randall Hiss, MD   450 mg at 09/20/19 6045       Vital Signs:  Temp:  [36.5 ??C-37.1 ??C] 37 ??C  Heart Rate:  [76-98] 98  SpO2 Pulse:  [78] 78  Resp:  [15-28] 21  BP: (109-120)/(65-74) 120/70  MAP (mmHg):  [78-85] 84  SpO2:  [100 %] 100 %    Intake/Output last 3 shifts:  I/O last 3 completed shifts:  In: 2345 [P.O.:720; I.V.:1400; IV Piggyback:225]  Out: 2250 [Urine:1050; Stool:1200]    Physical Exam:  Constitutional: laying in bed in NAD.  Answering questions appropriately  HEENT: PEERL, +scleral icterus  CV: Regular rate and rhythm  Pulmonary: no increased WOB  Abdomen: soft, normal bowel sounds, moderate ascites, non-tender.  Skin: jaundiced, acne  Extremities: WWP, no edema  Neuro: Alert, Oriented x 3    Diagnostic Studies:   Labs:  Recent Labs     09/18/19  0219 09/19/19  0351 09/20/19  0355   WBC 6.5 5.8 6.4   HGB 9.7* 8.8* 8.6*   HCT 29.5* 27.0* 26.5*   PLT 467* 400 373     Recent Labs     09/18/19  1311 09/18/19  1606 09/19/19  0351 09/20/19  0354   NA 133* 138 142 138   K  --  4.3 - 4.1 3.9 3.8   CL 110*  --  113* 114*   BUN 24*  --  24* 24*   CREATININE 1.27  --  1.22 1.37*   GLU 191*  --  175* 125     Recent Labs     09/18/19  1606 09/19/19  0351 09/20/19  0354   PROT 6.0*  --   --    ALBUMIN 2.9* 2.4* 3.1*   AST 208* 165* 150*   ALT  --   --  239*   ALKPHOS 782* 832* 771*   BILITOT 25.3* 26.7* 29.7*     Recent Labs     09/18/19  0220 09/18/19  1311 09/20/19  0354   INR 1.63 1.46 1.60   APTT  --  30.2 49.0*       Imaging/Procedures:  Right  Evidence of acute DVT in the brachial vein. Evidence of acute superficial venous  obstruction in the basilic vein. Acute obstruction in the axillary vein.  Left  No evidence of DVT detected in the central veins or arm veins.

## 2019-09-20 NOTE — Unmapped (Signed)
Confidential Psychological Therapy Session  [facility] for Transplant Care    Patient Name: [name] Gross  Medical Record Number: [id number]  Date of Service: September 20, 2019  Clinical Psychologist: Artemio Aly, PhD  Intern: None  Time Spent: 30 min of face-to-face counseling  CPT Procedure Code: 81191 (30 min psychotherapy with patient and/or family)  Therapy Type: Behavior Modifying/Cognitive Behavioral Therapy (CBT)  Purpose of Treatment: adjustment to chronic medical illness,??improve coping, reduce depression symptoms.    Referral/Relevant History:  Mr.??[name] Gross??is a very pleasant 18 y.o.??male??who presents for cognitive behavioral therapy to address adjustment to chronic medical illness (and recent hospitalization for liver transplant rejection). He was seen by the adult inpatient psychiatry team on 08/23/19 for a history of MDD (and two previous suicide attempts, last attempt April 2019) and current depressive symptoms. During assessment??with psychiatry, [name] declined pharmacotherapy but stated that he would be interested in receiving therapy services while inpatient to work through his difficult medical diagnoses and family situation.??He was seen by this provider for an initial visit on 08/29/19 and noted improved mood after reconnecting with social support, and requested continued psychology follow-up while inpatient. He was seen again by inpatient psychiatry on 09/01/19 and continued to decline pharmacotherapy, but later expressed interest in this and was prescribed venlafaxine prior to discharge from the hospital on 09/10/19.    [name] was seen by this provider on 09/13/19 following discharge from the hospital for therapy, but then re-presented to the hospital on 09/15/19 for elevated creatinine prior to leaving AMA. He was then re-admitted to the hospital on 09/18/19 for a fever.     Review of Symptoms/ROS: Deferred    Subjective:   [name] reported that he came back to the hospital on Saturday night because he had a fever, which has subsequently resolved. He denied any physical discomfort currently and is hopeful that he will not have to stay long. He talked about leaving AMA on 09/15/19, which he stated was due to significant pain after a nurse had problems getting an IV in. He noted that he felt overwhelmed and just needed to leave, but acknowledged understanding why this would be worrisome to the transplant team. He expressed motivation to adhere to medical recommendations, though he wants to continue being more independent as well.    He described his mood as annoyed that he had to return, but otherwise has been feeling great and denied any current symptoms of depression. He noted that he has been trying to be as active as possible, including doing PT exercises and walking around his grandmother's house, watching YouTube videos, and planning meals to cook when he is able to.     [name]' mother was present at the start of today's session before leaving for the majority of it. He noted that his family was informing his mother of how he was doing behind my back, so it seemed useless to continue to restrict her from his medical care. He reported feeling indifferent to having her back involved in his care, and hopes to be able to move on from the incident that led to him being homeless. He plans on continuing to live with his grandmother, and he noted that things were getting better at her home as she continued to learn how to keep caring for him.      Objective:     Mental Status Exam:  Appearance: Malnourished and jaundiced  Motor: Very small hand tremor, less noticeable than previous sessions  Speech/Language: Quiet  speech, but normal rate and rhythm   Mood: Annoyed  Affect: Sad and somewhat flat, but he noted that he has always been quiet and private at baseline  Thought Process: Logical, linear, clear, coherent, goal directed  Thought Content: Denies SI, HI, self harm, delusions, obsessions, paranoid ideation, or ideas of reference  Perceptual Disturbances: Denies auditory and visual hallucinations, behavior not concerning for response to internal stimuli  Orientation: Oriented to person, place, time, and general circumstances  Attention: Able to fully attend without fluctuations in consciousness  Concentration: Able to fully concentrate and attend  Memory: Immediate, short-term, long-term, and recall grossly intact  Fund of Knowledge: Consistent with level of education and development  Insight: Intact  Judgment: Intact  Impulse Control: Intact    Assessment:  [name] participated well in this CBT session and exhibits good motivation towards treatment goals.     Mr.??[name] Gross??described his mood as annoyed after coming back to the hospital, but otherwise he has been great. He continues to look mildly depressed, as he is quiet and somewhat difficult to engage; however, he notes that this may be his baseline and that he is a quiet and private person in general. He appears to meet criteria for recurrent MDD, and may be in early remission at this time given recent improvements in the past several weeks.??It is likely that continuing to promote ways to help [name] remain engaged and socially connected will be helpful, especially given this new hospitalization. Continuing to promote behavioral activation and goal setting will also likely be particularly useful. Additionally, exploring his values in life and finding ways to help him maintain some independence will likely be very beneficial. Improvements??in depression and anxiety symptoms can contribute to improved quality of life, functionality, and ability to cope with chronic illness.   ??  Focus on current treatment is??reduction of depressive symptoms, increasing behavioral activation, adjustment to chronic medical conditions.   ??  Focus on future sessions will include??values exploration, behavioral activation, learning cognitive coping strategies, relaxation strategies.  ??  Adherence concerns:??[name] left AMA after presenting to the hospital on 09/15/19, despite speaking with his TNC Jamey Reas. He reported that he understands why leaving AMA is concerning to the transplant team, but felt overwhelmed after being in excruciating pain and felt like he needed to leave. Mr. Hollibaugh appears to be struggling with the balance between wanting independence and needing medical assistance, but expressed a willingness to adhere to medical recommendations. He appears to be doing well with medication management and attendance to outpatient appts recently.   ??  Diagnostic Impression:??Major Depressive Disorder, Recurrent Episode, Moderate Severity, in early remission    Risk Assessment:  A suicide and violence risk assessment was performed as part of this evaluation. The patient is deemed to be at chronic elevated risk for self-harm/suicide given the following factors: male age 58-35, current diagnosis of depression, previous acts of self-harm and chronic severe medical condition. The patient is deemed to be at chronic elevated risk for violence given the following factors: male gender and younger age. These risk factors are mitigated by the following factors:lack of active SI/HI, no know access to weapons or firearms, motivation for treatment, presence of an available support system, expresses purpose for living, current treatment compliance and safe housing. There is no acute risk for suicide or violence at this time. The patient was educated about relevant modifiable risk factors including following recommendations for treatment of psychiatric illness and abstaining from substance abuse.  While future psychiatric events cannot be accurately predicted, the patient does not currently require  acute inpatient psychiatric care and does not currently meet [facility] involuntary commitment criteria.      Psychometric Testing:   None administered today. He completed the PHQ-9 and GAD-7 on 09/13/19, and obtained scores of 3 on the PHQ-9 (indicative of minimal symptoms of depression) and 2 on the GAD-7 (indicative of minimal symptoms of anxiety).      Plan:  Mr.  Gelpi will continue meeting with me for CBT, and was given instructions for therapeutic homework, including reviewing written materials provided about a values exploration.  ??  Mr.  Sitar will be seen approximately once/week while inpatient. If he is discharged within the next week, he will be scheduled to be seen concurrent with other appts, if possible, within 1-2 weeks; otherwise, writer will call Mr. Filley to set up a virtual follow-up visit.    ??  Mr.  Scrima was given this writer's business card with confidential voice mail number and instructed to call [phone] for emergencies.

## 2019-09-20 NOTE — Unmapped (Signed)
[name] Gross has been afebrile, VSS on room air. UOP adequate, BM x1. PIV is C/D/I, saline locked. 1x lovenox given. 2100 lantus refused, did not eat late night snack. Mom is at bedside and involved in care. Will continue to monitor and follow plan of care.    Problem: Adult Inpatient Plan of Care  Goal: Plan of Care Review  Outcome: Progressing  Goal: Patient-Specific Goal (Individualization)  Outcome: Progressing  Goal: Absence of Hospital-Acquired Illness or Injury  Outcome: Progressing  Goal: Optimal Comfort and Wellbeing  Outcome: Progressing  Goal: Readiness for Transition of Care  Outcome: Progressing  Goal: Rounds/Family Conference  Outcome: Progressing     Problem: Self-Care Deficit  Goal: Improved Ability to Complete Activities of Daily Living  Outcome: Progressing     Problem: Fall Injury Risk  Goal: Absence of Fall and Fall-Related Injury  Outcome: Progressing     Problem: Wound  Goal: Optimal Wound Healing  Outcome: Progressing     Problem: Adjustment to Illness (Liver Failure)  Goal: Optimal Coping with Liver Failure  Outcome: Progressing     Problem: Neurologic Function Impaired (Liver Failure)  Goal: Optimal Neurologic Function  Outcome: Progressing     Problem: Oral Intake Inadequate (Liver Failure)  Goal: Optimal Nutrition Intake  Outcome: Progressing

## 2019-09-20 NOTE — Unmapped (Signed)
Diabetes Care Team Follow Up Consult Note     Requesting Attending Physician : Morrison Old, MD  Service Requesting Consult : [facility])  Primary Care Provider: [name], MD    Assessment and Plan:  IMPRESSION:  [name] Gross is a 18 y.o. male admitted for No Principal Problem: There is no principal problem currently on the Problem List. Please update the Problem List and refresh.. We have been consulted at the request of Morrison Old, MD to evaluate [name] Gross for hyperglycemia.     RECOMMENDATIONS:  1. Medication induced hyperglycemia, uncontrolled: on prednisone 40mg . Started lantus 5u last night. BG within range yesterday. Recommend decreasing mealtime coverage now that he is receiving basal insulin and changing to lispro 1:50>150 for correctional insulin.   - lantus 5 units nightly  - lispro 2-4 units TID AC (2u with small meal/snack, 4u with large meal)  - lispro 1:50>150    Other problems complicating glycemic control:  Liver transplant  Active Problems:    Liver transplant rejection (CMS-HCC)    AKI (acute kidney injury) (CMS-HCC)    Fever     Thank you for this consult.  We will continue to follow and make recommendations and place orders as appropriate.    Please page with questions or concerns: [place], [place]: [phone] from 6AM - 3PM on weekdays or endocrine fellow on call: [phone] from 3PM - 6AM on weekdays or on weekends and holidays. If APP cannot be reached, please page the endocrine fellow on call.    Subjective:  Initial encounter HPI:  [name] Gross is a 18 y.o. 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 Endocrinology consulted for steroid-induced hyperglycemia.    Diabetes History:  Patient has a history of Medication induced hyperglycemia.  Diabetes is managed by: unknown.  Current home diabetes regimen: lantus 10u, novalog 3-6u with meals, SSI  Current home blood glucose monitoring: unknown  Typical home blood glucose range: unknown  Hypoglycemia awareness: yes.  Complications related to diabetes: none known    Interval History: He reports feeling ok this morning. He explains that his appetite is not great. He denies nausea and vomiting. Lantus 5u started last night. Recommend decreasing mealtime coverage today now that he will have basal insulin as well and recommend decreasing correction to lispro 1:50>150.    Current Diabetes Inpatient Regimen:  - lantus 5u nightly  - lispro 3-6u TID AC (3u for small meal, 6u for large meal)  - lispro 2:50>150    Current Nutrition:  Active Orders   Diet    Nutrition Therapy Regular/House       ROS: As per HPI    ??? benzoyl peroxide   Topical Nightly   ??? calcium carbonate  300 mg elem calcium Oral BID   ??? cefTRIAXone  1 g Intravenous Q24H   ??? cholecalciferol (vitamin D3)  5,000 Units Oral Daily   ??? clindamycin   Topical BID   ??? enoxaparin (LOVENOX) injection  1 mg/kg Subcutaneous Q12H [address] [facility]   ??? insulin glargine  5 Units Subcutaneous Nightly   ??? insulin lispro  0-10 Units Subcutaneous 4x Daily   ??? insulin lispro  3-6 Units Subcutaneous 4x Daily   ??? ketoconazole   Topical Daily   ??? lactulose  30 g Oral TID   ??? magnesium oxide  400 mg Oral Daily   ???  melatonin  3 mg Oral QPM   ??? metroNIDAZOLE  500 mg Oral TID   ??? multivitamin, with zinc  2 tablet Oral Daily   ??? mupirocin   Topical TID   ??? mycophenolate  540 mg Oral BID   ??? nystatin  1,000,000 Units Oral TID   ??? pantoprazole  40 mg Oral Daily   ??? potassium & sodium phosphates 250mg   2 packet Oral TID   ??? predniSONE  40 mg Oral Daily   ??? rifAXIMin  550 mg Oral BID   ??? sulfamethoxazole-trimethoprim  1 tablet Oral Q MWF   ??? valGANciclovir  450 mg Oral Daily       Past Medical History:   Diagnosis Date   ??? 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 Depression    ??? MSSA bacteremia 08/27/2019   ??? Seasonal allergies    ??? Sickle cell trait (CMS-HCC)    ??? Strep Mitis Central line-associated bloodstream infection 08/27/2019       Family History   Problem Relation Age of Onset   ??? Diabetes Maternal Grandmother    ??? Diabetes Paternal Grandmother    ??? Hypertension Maternal Grandfather    ??? Hypertension Paternal Grandfather    ??? Asthma Brother    ??? Anesthesia problems Neg Hx    ??? Melanoma Neg Hx    ??? Basal cell carcinoma Neg Hx    ??? Squamous cell carcinoma Neg Hx        Social History     Tobacco Use   ??? Smoking status: Never Smoker   ??? Smokeless tobacco: Never Used   Substance Use Topics   ??? Alcohol use: Never     Alcohol/week: 0.0 standard drinks     Frequency: Never     Comment: 1/2 glass of wine on rare occasions   ??? Drug use: Never       OBJECTIVE: Patient was seen face to face. Full exam not completed due to COVID-19/limiting PPE and exposure.  BP 110/65  - Pulse 76  - Temp 36.7 ??C (98.1 ??F) (Oral)  - Resp 15  - Ht 175 cm (5' 8.9) Comment: pt unable to stand all the way straight  - Wt 53.9 kg (118 lb 13.3 oz)  - SpO2 100%  - BMI 17.60 kg/m??   Wt Readings from Last 12 Encounters:   09/18/19 53.9 kg (118 lb 13.3 oz) (3 %, Z= -1.85)*   09/13/19 53.8 kg (118 lb 9.6 oz) (3 %, Z= -1.86)*   09/07/19 56.1 kg (123 lb 10.9 oz) (6 %, Z= -1.53)*   01/21/19 54.1 kg (119 lb 4.3 oz) (4 %, Z= -1.71)*   10/13/18 58.5 kg (129 lb) (15 %, Z= -1.05)*   08/11/18 62.2 kg (137 lb 3.2 oz) (28 %, Z= -0.58)*   07/21/18 61.8 kg (136 lb 3.2 oz) (27 %, Z= -0.62)*   07/16/18 61.8 kg (136 lb 3.2 oz) (27 %, Z= -0.61)*   07/05/18 61.7 kg (136 lb 0.4 oz) (27 %, Z= -0.61)*   06/25/18 61 kg (134 lb 7.7 oz) (25 %, Z= -0.69)*   05/18/18 55.1 kg (121 lb 6.4 oz) (8 %, Z= -1.41)*   05/15/18 56.1 kg (123 lb 10.9 oz) (10 %, Z= -1.27)*     * Growth percentiles are based on CDC (Boys, 2-20 Years) data.  General: NAD, lying in bed, appears tired  Lungs: normal wob  Psych: calm, engaged, appropriate mood and affect  Neuro: alert and oriented    Data Review    BG/insulin reviewed per EMR.   Glucose, POC (mg/dL)   Date Value   25/36/6440 166   09/19/2019 179   09/19/2019 90   09/18/2019 200 (H)   09/18/2019 221 (H)   09/18/2019 210 (H)   09/18/2019 99   09/18/2019 167        Summary of labs:  Lab Results   Component Value Date    A1C 5.0 08/17/2019    A1C 4.9 10/13/2018    A1C 7.6 (H) 07/16/2018     Lab Results   Component Value Date    CREATININE 1.37 (H) 09/20/2019     Lab Results   Component Value Date    WBC 6.4 09/20/2019    HGB 8.6 (L) 09/20/2019    HCT 26.5 (L) 09/20/2019    PLT 373 09/20/2019       Lab Results   Component Value Date    NA 138 09/20/2019    K 3.8 09/20/2019    CL 114 (H) 09/20/2019    CO2  09/20/2019      Comment:      Specimen icteric.     BUN 24 (H) 09/20/2019    CREATININE 1.37 (H) 09/20/2019    GLU 125 09/20/2019    CALCIUM 8.8 09/20/2019    MG 1.4 (L) 09/20/2019    PHOS 3.7 09/20/2019       Lab Results   Component Value Date    BILITOT 29.7 (H) 09/20/2019    BILIDIR 27.20 (H) 09/20/2019    PROT  09/20/2019      Comment:      Specimen icteric.     ALBUMIN 3.1 (L) 09/20/2019    ALT 239 (H) 09/20/2019    AST 150 (H) 09/20/2019    ALKPHOS 771 (H) 09/20/2019    GGT 374 (H) 09/20/2019       Lab Results   Component Value Date    INR 1.60 09/20/2019    APTT 49.0 (H) 09/20/2019

## 2019-09-20 NOTE — Unmapped (Signed)
Pediatric Daily Progress Note     Assessment/Plan:     Active Problems:    Liver transplant rejection (CMS-HCC)    AKI (acute kidney injury) (CMS-HCC)    Fever  Resolved Problems:    * No resolved hospital problems. *    RAMIE ERMAN is a 18 y.o. 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 Patient was transfered to acute care floor on 11/9. Now found to have a right upper extremity DVT on PVL. Unclear if clot is old (could be 2/2 to PICC line) so as Jong is asymptomatic and in consultation with Heme/onc will stop anticoagulation and reassess on Friday with repeat dopplers.  He requires continued admission for continued empiric treatment for SBP, management of hepatic encephalopathy, fluid management, further infectious workup of fever and medication monitoring.     Fever in immunocompromised patient: febrile at home on 11/7, no fever inpatient. Blood culture NG x24h, Ucx neg, RPP/COVID neg, paracentesis cytology not consistent with SBP, echo without endocarditis, and LP deferred given clinical improvement. Unclear source at present, requires continued empiric antibiotic treatment until infectious source ruled out.   -Adult transplant ID following, appreciate reccs  -F/u blood, urine cultures  -F/u ascitic fluid cultures/AFB  -HHV, HSV, EBV, CMV, crypto Ag, Fungitell??  -Obtain HIV, RPR, gonorrhea/chlamydia  -Can consider MRCP if abdominal pain not improving/worsening  - ceftriaxone/flagyl??  -Bactrim PPx MWF  -Valgancyclovir PPx daily  - f/u chest X-ray  - Will add CMV IgG, HSV IgG, and gonorrhea/chlamydia NAAT oral per ID  ??  Right upper extremity DVT: Evidence of acute obstruction is seen on right upper extremity PVL.  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  AKI: increased renal echogenicity, Cr improved with IV fluids, but overall fluid up despite vascular fluid depletion.   - set a PO goal of 1600 ml  -regular diet (low sodium)  -no mIVF  - NPO at 0000 for EGD  ??  Pleural effusion: trace effusion on chest X-ray   -continue pulse ox  -CXR if signs of respiratory distress or hypoexmia  -fluid management as above  ??  Steroid induced hyperglycemia:   -Adult endocrinology following, appreciate reccs  -Will restart??Lantus??5??units at bedtime??given??hyperglycemia on AM BMP (typically on 10u lantus nightly at home)  -Nutritional insulin when eating:??Lispro??3-6??units with meals (Hold if NPO)  -Continue Lispro correction 1:50>150 ACHS  -PRN glucose tablet, glucagon for hypoglycemia??  ??  Derm: Oral thrush, steroid induced acne??and atypical folliculitis.  -Dermatology consult today  -Nystatin swish TID  -Topical clindamycin, benzoyl peroxide, ketoconazole shampoo  ??  Complex social situation:   Hx of significant social stressors, patient cut ties with all family members. Appears to be mending the relationship with  his parents. Lives primarily with paternal grandmother. [name] is HCPOA per ACP documentation.    Access: PIV    Discharge Criteria:     Plan of care discussed with caregiver(s) at bedside.      Subjective:     Interval History: No acute events overnight, noted to have right upper extremity DVT on PVL.    Objective:     Vital signs in last 24 hours:  Temp:  [36.5 ??C-37.1 ??C] (P) 37.1 ??C  Heart Rate:  [74-101] (P) 82  SpO2 Pulse:  [74-102] 78  Resp:  [15-28] (P) 20  BP: (109-124)/(65-76) (P) 112/69  MAP (mmHg):  [78-88] (P) 82  SpO2:  [99 %-100 %] (P) 100 %  Intake/Output last 3 shifts:  I/O last 3 completed shifts:  In: 2345 [P.O.:720; I.V.:1400; IV Piggyback:225]  Out: 2250 [Urine:1050; Stool:1200]    Physical Exam:  Gen: Resting comfortably in bed in no acute distress  Eyes: Sclera icteric, conjunctivae non-injected.  ENT: External ear normal, mucous membranes moist  Heart: Regular rate and rhythm without murmurs, rubs, or gallops.  S1 and S2 normal. 2+ radial pulses   Pulmonary: Good air movement. Diminish at the bases bilaterally. Normal work of breathing  Skin: No rashes  Abd: Normal bowel sounds, soft, RUQ tenderness, and mild abd distention.   Ext: Warm and well perfused without cyanosis, clubbing, or edema.  Neuro: No focal findings        Active Medications reviewed and KEY Medications include:    Current Facility-Administered Medications:   ???  benzoyl peroxide 5 % gel, , Topical, Nightly, Randall Hiss, MD  ???  calcium carbonate (OS-CAL) tablet 300 mg elem calcium, 300 mg elem calcium, Oral, BID, Randall Hiss, MD, 300 mg elem calcium at 09/20/19 0935  ???  cefTRIAXone dilution (ROCEPHIN) 40 mg/mL injection 1 g, 1 g, Intravenous, Q24H, Randall Hiss, MD, Last Rate: 50 mL/hr at 09/19/19 1902, 1 g at 09/19/19 1902  ???  cholecalciferol (vitamin D3) tablet 5,000 Units, 5,000 Units, Oral, Daily, Randall Hiss, MD, 5,000 Units at 09/20/19 0934  ???  clindamycin (CLEOCIN T) 1 % lotion, , Topical, BID, Randall Hiss, MD  ???  dextrose 50 % in water (D50W) 50 % solution 12.5 g, 12.5 g, Intravenous, Q10 Min PRN, Randall Hiss, MD  ???  enoxaparin (LOVENOX) syringe 50 mg, 1 mg/kg, Subcutaneous, Q12H SCH, Jonna Clark, MD, 50 mg at 09/20/19 0123  ???  glucose chewable tablet 4 g, 4 g, Oral, 4x Daily PRN, Randall Hiss, MD  ???  insulin glargine (LANTUS) injection 5 Units, 5 Units, Subcutaneous, Nightly, Randall Hiss, MD, 5 Units at 09/19/19 2126  ???  insulin lispro (HumaLOG) injection 0-10 Units, 0-10 Units, Subcutaneous, 4x Daily, Randall Hiss, MD, Stopped at 09/19/19 2246  ???  insulin lispro (HumaLOG) injection 3-6 Units, 3-6 Units, Subcutaneous, 4x Daily, Randall Hiss, MD, Stopped at 09/19/19 2246  ???  ketoconazole (NIZORAL) 2 % shampoo, , Topical, Daily, Randall Hiss, MD  ???  lactulose (CHRONULAC) oral solution, 30 g, Oral, TID, Randall Hiss, MD, 30 g at 09/19/19 2124  ???  magnesium oxide (MAG-OX) tablet 400 mg, 400 mg, Oral, Daily, Randall Hiss, MD, 400 mg at 09/20/19 0935  ???  melatonin tablet 3 mg, 3 mg, Oral, QPM, Randall Hiss, MD, 3 mg at 09/19/19 2129  ???  metroNIDAZOLE (FLAGYL) tablet 500 mg, 500 mg, Oral, TID, Randall Hiss, MD, 500 mg  at 09/20/19 0946  ???  multivitamin, with zinc (AQUADEKS) chewable tablet, 2 tablet, Oral, Daily, Randall Hiss, MD, 2 tablet at 09/20/19 2956  ???  mupirocin (BACTROBAN) 2 % ointment, , Topical, TID, Randall Hiss, MD  ???  mycophenolate (MYFORTIC) EC tablet 540 mg, 540 mg, Oral, BID, Randall Hiss, MD, 540 mg at 09/20/19 2130  ???  nystatin (MYCOSTATIN) oral suspension, 1,000,000 Units, Oral, TID, Randall Hiss, MD, 1,000,000 Units at 09/20/19 0936  ???  ondansetron (ZOFRAN-ODT) disintegrating tablet 4 mg, 4 mg, Oral, Q8H PRN, Randall Hiss, MD  ???  pantoprazole (PROTONIX) EC tablet 40 mg, 40 mg, Oral, Daily, Randall Hiss, MD, 40 mg at 09/20/19 8657  ???  polyethylene glycol (MIRALAX) packet 17 g, 17 g, Oral, BID PRN, Randall Hiss, MD  ???  potassium & sodium phosphates 250mg  (PHOS-NAK/NEUTRA PHOS) packet 2 packet, 2 packet, Oral, TID, Randall Hiss, MD, 2 packet at 09/20/19 0934  ???  predniSONE (DELTASONE) tablet 40 mg, 40 mg, Oral, Daily, Randall Hiss, MD, 40 mg at 09/20/19 0900  ???  rifAXIMin (XIFAXAN) tablet 550 mg, 550 mg, Oral, BID, Randall Hiss, MD, 550 mg at 09/20/19 0935  ???  sulfamethoxazole-trimethoprim (BACTRIM) 400-80 mg tablet 80 mg of trimethoprim, 1 tablet, Oral, Q MWF, Randall Hiss, MD, 80 mg of trimethoprim at 09/19/19 0904  ???  valGANciclovir (VALCYTE) tablet 450 mg, 450 mg, Oral, Daily, Randall Hiss, MD, 450 mg at 09/20/19 8469        Studies: Personally reviewed and interpreted.  Labs/Studies:  Labs and Studies from the last 24hrs per EMR and Reviewed and   All lab results last 24 hours:    Recent Results (from the past 24 hour(s))   POCT Glucose    Collection Time: 09/19/19 12:45 PM   Result Value Ref Range    Glucose, POC 179 70 - 179 mg/dL   POCT Glucose    Collection Time: 09/19/19  4:09 PM   Result Value Ref Range    Glucose, POC 166 70 - 179 mg/dL   Comprehensive Metabolic Panel    Collection Time: 09/20/19  3:54 AM   Result Value Ref Range    Sodium 138 135 - 145 mmol/L    Potassium 3.8 3.5 - 5.0 mmol/L    Chloride 114 (H) 98 - 107 mmol/L    Anion Gap      CO2      BUN 24 (H) 7 - 21 mg/dL    Creatinine 6.29 (H) 0.70 - 1.30 mg/dL    BUN/Creatinine Ratio 18     EGFR CKD-EPI Non-African American, Male 56 >=60 mL/min/1.68m2    EGFR CKD-EPI African American, Male 44 >=60 mL/min/1.50m2    Glucose 125 70 - 179 mg/dL    Calcium 8.8 8.5 - 52.8 mg/dL    Albumin 3.1 (L) 3.5 - 5.0 g/dL    Total Protein      Total Bilirubin 29.7 (H) 0.0 - 1.2 mg/dL    AST 413 (H) 19 - 55 U/L    ALT 239 (H) <50 U/L    Alkaline Phosphatase 771 (H) 65 - 260 U/L   Magnesium Level    Collection Time: 09/20/19  3:54 AM   Result Value Ref Range    Magnesium 1.4 (L) 1.6 - 2.2 mg/dL   Phosphorus Level    Collection Time: 09/20/19  3:54 AM   Result Value Ref Range    Phosphorus 3.7 2.9 - 4.7  mg/dL   Gamma GT (GGT)    Collection Time: 09/20/19  3:54 AM   Result Value Ref Range    GGT 374 (H) 12 - 109 U/L   Bilirubin, Direct    Collection Time: 09/20/19  3:54 AM   Result Value Ref Range    Bilirubin, Direct 27.20 (H) 0.00 - 0.40 mg/dL   PT-INR    Collection Time: 09/20/19  3:54 AM   Result Value Ref Range    PT 18.6 (H) 10.2 - 13.1 sec    INR 1.60    APTT    Collection Time: 09/20/19  3:54 AM   Result Value Ref Range    APTT 49.0 (H) 25.3 - 37.1 sec    Heparin Correlation 0.3    D-Dimer, Quantitative    Collection Time: 09/20/19  3:54 AM   Result Value Ref Range    D-Dimer 713 (H) <230 ng/mL DDU   CBC w/ Differential    Collection Time: 09/20/19  3:55 AM   Result Value Ref Range    WBC 6.4 4.5 - 11.0 10*9/L    RBC 2.78 (L) 4.50 - 5.90 10*12/L    HGB 8.6 (L) 13.5 - 17.5 g/dL    HCT 29.5 (L) 18.8 - 53.0 %    MCV 95.4 80.0 - 100.0 fL    MCH 30.8 26.0 - 34.0 pg    MCHC 32.3 31.0 - 37.0 g/dL    RDW 41.6 (H) 60.6 - 15.0 %    MPV 9.2 7.0 - 10.0 fL    Platelet 373 150 - 440 10*9/L    Variable HGB Concentration Moderate (A) Not Present    Neutrophils % 84.7 %    Lymphocytes % 4.8 %    Monocytes % 8.2 %    Eosinophils % 0.1 %    Basophils % 0.5 %    Absolute Neutrophils 5.4 2.0 - 7.5 10*9/L    Absolute Lymphocytes 0.3 (L) 1.5 - 5.0 10*9/L    Absolute Monocytes 0.5 0.2 - 0.8 10*9/L    Absolute Eosinophils 0.0 0.0 - 0.4 10*9/L    Absolute Basophils 0.0 0.0 - 0.1 10*9/L    Large Unstained Cells 2 0 - 4 %    Microcytosis Moderate (A) Not Present    Macrocytosis Marked (A) Not Present    Anisocytosis Marked (A) Not Present    Hypochromasia Marked (A) Not Present   Ammonia    Collection Time: 09/20/19  5:04 AM   Result Value Ref Range    Ammonia 19 9 - 33 umol/L     ========================================  Ejiofor Ezekwe Jr. MD PhD  Pediatrics PGY3  Pager: [phone]      I performed a history and physical examination of the patient and discussed the patient's management with the family and the resident. I personally reviewed the results of all tests, including blood work and images. I reviewed and edited the resident's note and agree with the documented findings and plan of care. I provided direct supervision to the care of the patient. I spent over 50% of a total 45 min counseling and/or coordinating the care of this patient.     Rocco Pauls, MD  Pediatric Gastroenterology

## 2019-09-20 NOTE — Unmapped (Signed)
Pt transferred from PICU @1600 . Pt afebrile VSS. BG checked before dinner, 166. Sliding scale insulin and scheduled insulin administered. Pt had bilateral upper and lower PVLs done for concern for DVT. Had large episode of diarrhea x1, voiding well. Eating well. Grandma at bedside, active in care. WCTM.      Problem: Adult Inpatient Plan of Care  Goal: Plan of Care Review  Outcome: Progressing  Goal: Patient-Specific Goal (Individualization)  Outcome: Progressing  Goal: Absence of Hospital-Acquired Illness or Injury  Outcome: Progressing  Goal: Optimal Comfort and Wellbeing  Outcome: Progressing  Goal: Readiness for Transition of Care  Outcome: Progressing  Goal: Rounds/Family Conference  Outcome: Progressing     Problem: Self-Care Deficit  Goal: Improved Ability to Complete Activities of Daily Living  Outcome: Progressing     Problem: Fall Injury Risk  Goal: Absence of Fall and Fall-Related Injury  Outcome: Progressing     Problem: Wound  Goal: Optimal Wound Healing  Outcome: Progressing     Problem: Adjustment to Illness (Liver Failure)  Goal: Optimal Coping with Liver Failure  Outcome: Progressing     Problem: Neurologic Function Impaired (Liver Failure)  Goal: Optimal Neurologic Function  Outcome: Progressing     Problem: Oral Intake Inadequate (Liver Failure)  Goal: Optimal Nutrition Intake  Outcome: Progressing

## 2019-09-20 NOTE — Unmapped (Signed)
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 Due to extensive hx primary team got PVLs which were remarkable for obstruction in axillary, brachial at prox to mid upper arm and basilic vein. Patient's hx is remarkable for a RUE PICC (recent per resident unable to get date). Patient's acute illness, dynamic dysequilibrium between procoagulant and anticoagulant states with underlying liver disease and line hx all place him at a higher risk for clot.      Plan:  - FULL CONSULT and RECS in AM   - CBCd, CMP, PT, PTT, DDimer   - please assess renal function and consider need for surgical intervention prior to initiating therapy; if reviewed and approp should proceed as below   - initiate Lovenox 1mg /kg BID   - check anti-Xa level 4-6h after the 2nd dose and again at 24 hours  - Goal anti Xa level 0.5-1  - Will need two levels within goal prior to DC   - Follow up within 1 week from DC with hematology team     Appreciate Consult    Recommendations discussed with Resident on call     Please call with questions.    Will continue to follow with you.    Reason for Consult: RUE DVT      Merrilyn Legler MD MPH  Peds Heme Onc PGY4  Pager [phone]

## 2019-09-20 NOTE — Unmapped (Signed)
Diagnostic Paracentesis Procedure Note (CPT [phone])    Pre-procedural Planning     Patient Name:: [name] Gross  Patient MRN: [id number]    Indications:  Ascites with fever    Known Bleeding Diathesis: Patient/caregiver denies any known bleeding or platelet disorder.     Antiplatelet Agents: This patient is not on an antiplatelet agent.    Systemic Anticoagulation: This patient is not on full systemic anticoagulation.    Significant Labs:  INR   Date Value Ref Range Status   09/20/2019 1.60  Final     PT   Date Value Ref Range Status   09/20/2019 18.6 (H) 10.2 - 13.1 sec Final     APTT   Date Value Ref Range Status   09/20/2019 49.0 (H) 25.3 - 37.1 sec Final     Platelet   Date Value Ref Range Status   09/20/2019 373 150 - 440 10*9/L Final   02/11/2019 197 150 - 450 x10E3/uL Final       Consent: Informed consent was obtained after explanation of the risks (including bleeding, infection, bowel perforation, and leaking) and benefits of the procedure. Refer to the consent documentation.    Procedure Details     Time-out was performed immediately prior to the procedure.    The head of the bed was placed at 45 degrees above level and a large area of ascitic fluid was identified by ultrasound in the right lower quadrant.  The linear probe with color doppler was used to ensure that a vessel was not located in the proposed needle path.    Skin was cleaned with Chlorhexidine. Local anesthesia with 1% lidocaine was introduced subcutaneously then deep to the skin until the parietal peritoneum was anesthetized. A 22 g straight needle was introduced into this site until ascitic fluid was encountered.  Ascitic fluid and the needle were removed with minimal bleeding.  A sterile bandage was placed after holding pressure.     Findings     25 mL of yellow ascites fluid was obtained.    The ascites fluid was sent for studies pwer primary team. I walked the fluid to the lab.    Condition     The patient tolerated the procedure well and remains in the same condition as pre-procedure.    Complications     Complications:  None; patient tolerated the procedure well.      Requesting Service: Ped Gastroenterology ([facility])

## 2019-09-20 NOTE — Unmapped (Signed)
PRE-TRANSPLANT INFECTIOUS DISEASE CONSULT NOTE    [name] Gross is being seen in consultation at the request of [name] Old, MD for evaluation of liver transplant.     Assessment/Plan:     [name] Gross is a 18 y.o. male being evaluated for liver transplantation.     The goals of the transplant infectious diseases evaluation were discussed with [name] Gross, who is in the transplant infectious diseases clinic to assess his infection risks and to render an opinion as to whether or not he is a suitable candidate for transplantation from the infectious diseases standpoint. I reviewed in detail, the risks of infection as they relate to liver transplantation and the use of life-long immunosuppression. I counseled the patient on the importance of vaccinations and provided specific guidance regarding the patient's occupation, activities and hobbies to avoid future infectious complications.    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  - Surgical complications: anastamosis issues within post op month 1; treated for cholangitis  - Serologies: CMV D-/R-, EBV D?/R+, Toxo D?/R-  - Induction: steroid  - Immunosuppression: Pred 40, tacro, MMF  - Prophylaxis: valgan, bactrim, nystatin  - Rejection history: 02/15/2018 (moderate acellular), 05/15/2018 (severe rejection; 06/10/19 (humoral rejection; 08/08/2019 (severe, chronic rejection) s/p ATG x 3, methylpred    MELD-Na score: 28 at 09/18/2019  4:06 PM  MELD score: 26 at 09/18/2019  4:06 PM  Calculated from:  Serum Creatinine: 1.27 mg/dL at 16/11/958  4:54 PM  Serum Sodium: 133 mmol/L at 09/18/2019  1:11 PM  Total Bilirubin: 25.3 mg/dL at 07/18/1190  4:78 PM  INR(ratio): 1.46 at 09/18/2019  1:11 PM  Age: 89 years 5 months  ??  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  Drug Intolerances  None  ??  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  #MSSA/S mitis bacteremia with possible abdominal (peritonitis?)  vs. CLABSI 08/22/2019  -cleared cultures (CVAD and periphery) by day 2 abx  -vanc/ceftriaxone 10/12-10/16->unasyn 10/16-10/26  -TTE wnl 10/16  -PICC removed at discharge 09/10/19    #Fever, AMS, RUQ pain c/f cholangitis vs RUE DVT; AMS 2/2 liver disease progression, resolved 09/17/2019  -negative paracentesis 48 hours into abx for SBP  -09/17/19 blood cultures NGTD  -US abdomen with ascites and chronic liver changes but without signs of pancreatitis  -ceftriaxone/flagyl 11/7-  -repeat Echo without changes  -crypto ag negative  -EBV, HSV VL wnl; CMV VL pending    #R parapneumonic effusion likely 2/2 ESLD 09/18/19    #At risk for STIs  -no prior screening noted in chart; however screened for HIV multiple times  -Oral sex only in past, denies currently  -RPR negative    #Oral candidiasis 09/10/19, 09/17/19  -responds to nystatin  -no cultures    Recommendations    For fever in immunocompromised host  -F/u CMV serum PCR  -F/u fungitell  ??  For concern for abdominal source of infection:  -Continue Ceftriaxone 1g IV q24 + Flagyl 500 mg PO TID with plans to treat for 10 days total (end date 09/26/19)  - MRCP for workup of possible cholangitis however per discussion with hepatology unlikely to proceed to ERCP and patient does NOT have stents in place  ??  For oral candidiasis  -agree with continuing nystatin, noted improvement  -if worsening, please send fungal culture  ??  For ppx in immunocompromised host:  -Continue Valagancyclovir ppx 450 mg daily per protocol  -continue bactrim ppx  ??  For other pre-transplant workup:  -f/u HIV test  -send gonorrhea/chlamydia from urine and from oropharynx swab  -CMV IgG  -HSV IgG (as discordance with immune status in our system)  -Please give Pneumovax (PCV 23) if patient has not received--not noted in system; otherwise review of vaccinations is up to date    Peri-transplant antimicrobial recommendations  - as per protocol    The [facility] ID service will continue to follow.  Please page the ID Transplant/Liquid Oncology Fellow consult at [phone] with questions.  Patient discussed with Dr. Kari Gross      _______________________________________________________________________    I discussed the patient with the fellow. I agree with the findings and the plan of care as documented in the fellow???s note.    [name] Moll, MD  Immunocompromised ID  Pager [phone]  _______________________________________________________________________        Subjective:    History of Present Illness: [name] Gross is a 18 y.o. year Gross male with advanced liver disease due to chronic liver rejection who is currently being evaluated for transplantation.    Source of information includesOrthoptist Records.  History obtained from:patient and family member.    History of pertinent past infections/conditions:  Frequent dental infections: no infections, has had cavities filled  Recurrent sinusitis: no  Recurrent bronchitis/pneumonias: no  Chronic diarrhea: no  Urinary tract infections: no  Kidney stones: no  Gout: no  Skin boils or Staph infection: yes- impetigo and MSSA bacteremia; has not isolated MRSA  Oral or genital ulcers: no  Thrush or vaginal yeast infections after antibiotics: yes  History of C difficile colitis: no  Methicillin-resistant Staphylococcus aureas colonization/infection: no  Vancomycin-resistant Enterococcus colonization/infection: no  Colonization/infection with a multidrug or antibiotic resistant organism: no    The patient has not been hospitalized for infections at hospitals besides [facility].   Last use of antibiotics was currently.    The patient was diagnosed with cirrhosis in 2016 and has a history of the following:   Confusion related to liver disease: yes  Stomach or intestinal bleeding: yes  Ascites or pleural fluid infection: yes  MELD-Na score: 28 at 09/20/2019  3:54 AM  MELD score: 28 at 09/20/2019  3:54 AM  Calculated from:  Serum Creatinine: 1.37 mg/dL at 08/65/7846  9:62 AM  Serum Sodium: 138 mmol/L (Rounded to 137 mmol/L) at 09/20/2019  3:54 AM  Total Bilirubin: 29.7 mg/dL at 95/28/4132  4:40 AM  INR(ratio): 1.60 at 09/20/2019  3:54 AM  Age: 35 years 5 months    Today the patient denies:  Fever, Chills and Night Sweats  abdominal pain, weight loss and diarrhea  cough and shortness of breath  Rash, Itching and Bleeding Moles    Additionally the patient notes: resolving abominal pain, persistent pruritis, no cough.       Past Medical History:   Diagnosis Date   ??? Acute rejection of liver transplant (CMS-HCC) 08/20/2017  Moderate acute cellular rejection with prominent centrilobular venulitis, RAI = 7/9 (portal inflammation: 3, bile duct inflammation/damage: 1, venous endothelial inflammation: 3)   ??? Depression    ??? MSSA bacteremia 08/27/2019   ??? Seasonal allergies    ??? Sickle cell trait (CMS-HCC)    ??? Strep Mitis Central line-associated bloodstream infection 08/27/2019     Past Surgical History:   Procedure Laterality Date   ??? FRENULECTOMY, LINGUAL     ??? PR ERCP REMOVE FOREIGN BODY/STENT BILIARY/PANC DUCT N/A 08/20/2017    Procedure: ENDOSCOPIC RETROGRADE CHOLANGIOPANCREATOGRAPHY (ERCP); W/ REMOVAL OF FOREIGN BODY/STENT FROM BILIARY/PANCREATIC DUCT(S);  Surgeon: Vonda Antigua, MD;  Location: GI PROCEDURES MEMORIAL [facility];  Service: Gastroenterology   ??? PR ERCP REMOVE FOREIGN BODY/STENT BILIARY/PANC DUCT N/A 06/04/2018    Procedure: ENDOSCOPIC RETROGRADE CHOLANGIOPANCREATOGRAPHY (ERCP); W/ REMOVAL OF FOREIGN BODY/STENT FROM BILIARY/PANCREATIC DUCT(S);  Surgeon: Chriss Driver, MD;  Location: GI PROCEDURES MEMORIAL [facility];  Service: Gastroenterology   ??? PR ERCP STENT PLACEMENT BILIARY/PANCREATIC DUCT N/A 11/21/2016    Procedure: ENDOSCOPIC RETROGRADE CHOLANGIOPANCREATOGRAPHY (ERCP); WITH PLACEMENT OF ENDOSCOPIC STENT INTO BILIARY OR PANCREATIC DUCT;  Surgeon: Vonda Antigua, MD;  Location: GI PROCEDURES [facility];  Service: Gastroenterology   ??? PR ERCP,W/REMOVAL STONE,BIL/PANCR DUCTS N/A 08/20/2017    Procedure: ERCP; W/ENDOSCOPIC RETROGRADE REMOVAL OF CALCULUS/CALCULI FROM BILIARY &/OR PANCREATIC DUCTS;  Surgeon: Vonda Antigua, MD;  Location: GI PROCEDURES [facility];  Service: Gastroenterology   ??? PR ERCP,W/REMOVAL STONE,BIL/PANCR DUCTS N/A 06/04/2018    Procedure: ERCP; W/ENDOSCOPIC RETROGRADE REMOVAL OF CALCULUS/CALCULI FROM BILIARY &/OR PANCREATIC DUCTS;  Surgeon: Chriss Driver, MD;  Location: GI PROCEDURES [facility];  Service: Gastroenterology   ??? PR TRANSPLANT LIVER,ALLOTRANSPLANT N/A 09/22/2016    Procedure: LIVER ALLOTRANSPLANTATION; ORTHOTOPIC, PARTIAL OR WHOLE, FROM CADAVER OR LIVING DONOR, ANY AGE;  Surgeon: Doyce Loose, MD;  Location: MAIN OR [facility];  Service: Transplant   ??? PR TRANSPLANT,PREP DONOR LIVER, WHOLE N/A 09/22/2016    Procedure: [facility] STD PREP CAD DONOR WHOLE LIVER GFT PRIOR TNSPLNT,INC CHOLE,DISS/REM SURR TISSU WO TRISEG/LOBE SPLT;  Surgeon: Doyce Loose, MD;  Location: MAIN OR [facility];  Service: Transplant   ??? PR UPGI ENDOSCOPY W/US FN BX N/A 08/20/2017    Procedure: UGI W/ TRANSENDOSCOPIC ULTRASOUND GUIDED INTRAMURAL/TRANSMURAL FINE NEEDLE ASPIRATION/BIOPSY(S), ESOPHAGUS;  Surgeon: Vonda Antigua, MD;  Location: GI PROCEDURES [facility];  Service: Gastroenterology   ??? PR UPPER GI ENDOSCOPY,BIOPSY N/A 07/15/2016    Procedure: UGI ENDOSCOPY; WITH BIOPSY, SINGLE OR MULTIPLE;  Surgeon: Arnold Long Mir, MD;  Location: PEDS PROCEDURE ROOM [facility];  Service: Gastroenterology   ??? PR UPPER GI ENDOSCOPY,CTRL BLEED Left 05/05/2016    Procedure: UGI ENDOSCOPY; WITH CONTROL OF BLEEDING, ANY METHOD;  Surgeon: Arnold Long Mir, MD;  Location: PEDS PROCEDURE ROOM [facility];  Service: Gastroenterology   ??? PR UPPER GI ENDOSCOPY,CTRL BLEED N/A 05/12/2016    Procedure: UGI ENDOSCOPY; WITH CONTROL OF BLEEDING, ANY METHOD;  Surgeon: Annie Paras, MD;  Location: CHILDRENS OR [facility];  Service: Gastroenterology       Medications  Antimicrobials: Ceftriaxone/Flagyl + valgan/bactrim  Prior/Current immunomodulators: Pred 40, Tacro, MMF  Other medications reviewed.    Allergies:   Allergies   Allergen Reactions   ??? Bee Pollen Rash     Pt has seasonal allergies that cause excessive sneezing and running nose- [name], NAII   ??? Pollen Extracts Rash     Pt has seasonal allergies that cause excessive sneezing and running nose- [name]  Social History:  Tobacco use:  reports that he has never smoked. He has never used smokeless tobacco.  Alcohol use:  reports no history of alcohol use.  Drug use:  reports no history of drug use.; however admission UTox in 07/2019 +THC; states hangs out with people who use MJ but no IVDU.   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     Family History:  Family History   Problem Relation Age of Onset   ??? Diabetes Maternal Grandmother    ??? Diabetes Paternal Grandmother    ??? Hypertension Maternal Grandfather    ??? Hypertension Paternal Grandfather    ??? Asthma Brother    ??? Anesthesia problems Neg Hx    ??? Melanoma Neg Hx    ??? Basal cell carcinoma Neg Hx    ??? Squamous cell carcinoma Neg Hx        Review of Systems  All other systems reviewed are negative.          Objective:      Vital Signs last 24 hours:  Temp:  [36.5 ??C-36.9 ??C] 36.7 ??C  Heart Rate:  [74-101] 76  SpO2 Pulse:  [74-102] 78  Resp:  [15-28] 15  BP: (102-124)/(54-76) 110/65  MAP (mmHg):  [68-88] 78  SpO2:  [99 %-100 %] 100 %    Physical Exam:  Patient Lines/Drains/Airways Status    Active Active Lines, Drains, & Airways     Name:   Placement date:   Placement time:   Site:   Days:    Peripheral IV 09/18/19 Left Forearm   09/18/19    0219    Forearm   2              GEN:  thin, small for age, diffuse rash on face (see derm); constricted affect  EYES: PERRL, EOMI and icteric sclera  GNF:AOZHYQMV thrush with no other oral lesions  LYMPH:no cervical, supraclavicular, axillary, or inguinal LAD  HQ:IONG are swollen, RRR and no abnormal heart sounds noted  PULM:normal work of breathing at rest, CTAB anteriorly and CTAB posteriorly  GI:+fluid wave with noted mod ascites, mild ttp through over RUQ   EX:[id number]  RECTAL:deferred  SKIN:hyperpigmented papules c/f steroid induced acne vs. follicultitis over face (pitryosporoum?); back with scaling and hypopigmentation c/f tinea versicolor  MSK:no vertebral point tenderness, no swollen joints and full neck ROM  NEURO:intention tremor noted but without asterixis, facial expression symmetric and moves extremities equally  PSYCH:attentive, appropriate affect, good eye contact, fluent speech    Labs:  Lab Results   Component Value Date    WBC 6.4 09/20/2019    WBC 6.2 09/15/2019    WBC 2.8 (L) 02/11/2019    HGB 8.6 (L)  09/20/2019    HGB 13.0 02/11/2019    Hemoglobin 9.6 (L) 09/22/2016    HCT 26.5 (L) 09/20/2019    HCT 40.4 02/11/2019    Platelet 373 09/20/2019    Platelet 197 02/11/2019    Absolute Neutrophils 5.4 09/20/2019    Absolute Neutrophils 0.6 (L) 02/11/2019    Absolute Lymphocytes 0.3 (L) 09/20/2019    Absolute Lymphocytes 2.0 02/11/2019    Absolute Eosinophils 0.0 09/20/2019    Absolute Eosinophils 0.1 02/11/2019    Sodium 138 09/20/2019    Sodium 139 02/11/2019    Sodium Whole Blood 142 09/22/2016    Potassium 3.8 09/20/2019    Potassium 4.3 02/11/2019    Potassium, Bld 4.0 09/22/2016    BUN 24 (H) 09/20/2019    BUN 24 (H) 09/15/2019    Creatinine 1.37 (H) 09/20/2019    Creatinine 1.40 (H) 02/11/2019    Glucose 125 09/20/2019    Magnesium 1.4 (L) 09/20/2019    Magnesium 1.7 02/11/2019    Albumin 3.1 (L) 09/20/2019    Total Bilirubin 29.7 (H) 09/20/2019    Total Bilirubin 1.2 02/11/2019    AST 150 (H) 09/20/2019    AST 214 (H) 02/11/2019    ALT 239 (H) 09/20/2019    ALT 219 (H) 02/11/2019    Alkaline Phosphatase 771 (H) 09/20/2019    Alkaline Phosphatase 368 (H) 02/11/2019    INR 1.60 09/20/2019    Sed Rate 25 (H) 08/22/2019    CRP  09/02/2019      Comment:      Specimen icteric.         Microbiology:  Past cultures were reviewed in Epic and CareEverywhere.      Imaging:    Independent visualization of images: I independently reviewed the image from 09/20/19 and I agree with the findings/interpretation.    Serologies:  Lab Results   Component Value Date    CMV IGG Negative 06/19/2018    EBV VCA IgG Antibody Positive (A) 01/07/2018    Hep A IgG Reactive (A) 05/15/2016    Hep B Surface Ag Nonreactive 08/04/2019    Hep B S Ab Reactive (A) 09/21/2016    Hep B Surf Ab Quant 38.11 (H) 09/21/2016    Hep B Core Total Ab Nonreactive 09/21/2016    Hepatitis C Ab Nonreactive 08/04/2019    RPR Nonreactive 09/21/2016    HSV 1 IgG Negative 09/21/2016    HSV 2 IgG Negative 09/21/2016    Varicella IgG Positive 09/21/2016    Rubella IgG Scr Positive 05/16/2016    Toxoplasma Gondii IgG Negative 09/21/2016    Quantiferon TB Gold Plus Interpretation Negative 05/22/2016    Quantiferon Mitogen Minus Nil >10.00 05/22/2016    Quantiferon Antigen 1 minus Nil -0.08 05/22/2016       Immunizations:  Immunization History   Administered Date(s) Administered   ??? DTaP 06/12/2000, 10/13/2000, 01/06/2001, 08/16/2001, 06/24/2004   ??? HPV Quadrivalent (Gardasil) 04/17/2011, 05/27/2012, 05/11/2013   ??? Hepatitis A 09/11/2006, 01/23/2009   ??? Hepatitis B vaccine, pediatric/adolescent dosage, June 01, 2000, 04/24/2000, 01/06/2001   ??? HiB-PRP-OMP 06/12/2000, 10/13/2000, 01/06/2001, 05/18/2001   ??? Influenza LAIV (Nasal-Quad) 2-70yrs 09/14/2014   ??? Influenza Vaccine Quad (IIV4 PF) 49mo+ injectable 10/12/2017, 09/10/2019   ??? MMR 05/18/2001, 06/24/2004   ??? Meningococcal Conjugate MCV4P 04/17/2011, 05/22/2016   ??? PPD Test 05/17/2016   ??? Pneumococcal Conjugate 13-Valent 06/12/2000, 10/13/2000, 01/06/2001   ??? Poliovirus, inactivated (IPV) 06/12/2000, 10/13/2000, 05/18/2001, 06/24/2004   ??? TdaP 04/17/2011   ??? Varicella 05/18/2001,  07/24/2005, 09/11/2006     Hepatitis A: completed as noted above  Hepatitis B: completed as noted above  Rubella: recheck immunity; however received series  Influenza: up to date  Pneumococcus: partially completed- no record of PCV23  Tetanus/Pertussis: up to date  Shingrix: not a candidate  HPV: completed

## 2019-09-20 NOTE — Unmapped (Signed)
Enoxaparin Therapeutic Monitoring Pharmacy Note    [name] Gross is a 18 y.o. male starting enoxaparin.    Indication: deep venous thrombosis (new)    Prior Dosing Information: None/new initiation    Goals:  Therapeutic Drug Levels  Anti-Xa (heparin LMW) level: 0.6-1 units/mL drawn 4-6 hours post-dose    Additional Clinical Monitoring/Outcomes  ? Monitor hemoglobin and platelets  ? Monitor for signs/symptoms of bleeding  ? Monitor renal function     Results:  Not applicable  Wt Readings from Last 3 Encounters:   09/18/19 53.9 kg (118 lb 13.3 oz) (3 %, Z= -1.85)*   09/13/19 53.8 kg (118 lb 9.6 oz) (3 %, Z= -1.86)*   09/07/19 56.1 kg (123 lb 10.9 oz) (6 %, Z= -1.53)*     * Growth percentiles are based on CDC (Boys, 2-20 Years) data.     HGB   Date Value Ref Range Status   09/19/2019 8.8 (L) 13.5 - 17.5 g/dL Final     Platelet   Date Value Ref Range Status   09/19/2019 400 150 - 440 10*9/L Final     Creatinine   Date Value Ref Range Status   09/19/2019 1.22 0.70 - 1.30 mg/dL Final       Pharmacokinetic Considerations and Significant Drug Interactions:   Concurrent antiplatelet medications: not applicable    Assessment/Plan:   Recommendation(s)  Start enoxaparin 50 mg (1mg /kg) SQ q12h    Follow-up  ? Level due: ordered on 09/20/19 @ 1700 (4hrs after 1300 dose)  ? A pharmacist will continue to monitor and recommend levels as appropriate      Please page service pharmacist with questions/clarifications.    Vanita Panda, PharmD

## 2019-09-20 NOTE — Unmapped (Signed)
PICU to [facility] Transfer/Accept Note    Hospital Course: Chart reviewed including notes, studies and hospital course.  Briefly, [name] Gross is a 18 y.o. 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 He had recently been admitted for a prolonged period (10/1-10/31) for acute rejection managed with tacrolimus, cellcept, steroids, and thyroglobulin, c/b MSSA and Strep Mitis bacteremia s/p 14d antibiotics prior to discharge. He had also been seen in clinic on 11/5 at which time tacrolimus was supra therapeutic and he was noted to have AKI, so was sent to the ED for IV hydration but left AMA. He then became more tired and had fever to 101.55F on 11/7 and represented for admission on AM of 11/8. Significant events so far this hospitalization include pediatric rapid response for lethargy/altered mental status on admission with concern for SBP, initiation of rifaximin and lactulose for hepatic encephalopathy, SBP empiric treatment with broad spectrum antibiotics (initial regimen CTX/Flagyl, changed to meropenem) and albumin, diagnostic paracentesis with minimal nucleated cells, and extensive ID workup. He has not had any further fever inpatient and mental status has improved, however he continues to have anasarca with body wall edema, ascites, and pleural effusion likely combination of liver failure and reduced filtration ability in setting of AKI.     Assessment/Plan:     Active Problems:    Liver transplant rejection (CMS-HCC)    AKI (acute kidney injury) (CMS-HCC)    Fever  Resolved Problems:    * No resolved hospital problems. *      Patient is clinically improving and ready for transfer to acute care floor on PMG service. He requires continued admission for continued empiric treatment for SBP, management of hepatic encephalopathy, fluid management, further infectious workup of fever, and medication monitoring.     Fever in immunocompromised patient: febrile at home on 11/7, no fever inpatient. Blood culture NG x24h, Ucx neg, RPP/COVID neg, paracentesis cytology not consistent with SBP, echo without endocarditis, and LP deferred given clinical improvement. Unclear source at present, requires continued empiric antibiotic treatment until infectious source ruled out.   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  -Adult hepatology consulted - appreciate reccs  -S/p 0.5mg /kg albumin, 50g albumin today  -Holding tacrolimus   -Continue myfortic 540mg  BID  -Prednisone 40mg  daily  -Protonix 40mg  daily  -Rifaximin 550mg  BID  -Lactulose 30g TID  -MVI, Mag, calcium, Vit D3, NaPhos supplements  -Zofran q8 PRN for nausea  -Regular diet  -AM CMP, GGT, DBili, PT/INR, APTT    AKI: increased renal echogenicity, Cr improving with IV fluids, but overall fluid up despite vascular fluid depletion  -regular diet  -no mIVF  -albumin 50g and consider lasix 20mg  to follow    Pleural effusion:   -continue pulse ox  -CXR if signs of respiratory distress or hypoexmia  -fluid management as above    Steroid induced hyperglycemia:   -Adult endocrinology following, appreciate reccs  -Will restart??Lantus??5??units at bedtime??given hyperglycemia on AM  BMP (typically on 10u lantus nightly at home)  -Nutritional insulin when eating:??Lispro??3-6??units with meals (Hold if NPO)  -Continue Lispro correction 1:50>150 ACHS  -PRN glucose tablet, glucagon for hypoglycemia Derm: Oral thrush, steroid induced acne and atypical folliculitis.  -Dermatology consult today  -Nystatin swish TID  -Topical clindamycin, benzoyl peroxide, ketoconazole shampoo    Complex social situation:   Hx of significant social stressors, patient cut ties with all family members. Appears to be mending the relationship with his parents. Lives primarily with paternal grandmother. [name] Gross is HCPOA per ACP documentation.    Lines: PIV    Lab plan: AM CBC, CMP, Mag, Phos, GGT, D bili, PT/INR, APTT    Social: grandmother updated at bedside regarding plan of care      Subjective:     Hurbert has had improved mental status since transfer to the PICU. Tolerated diagnostic paracentesis this AM without complications. Remains afebrile. Discontinued IVF and started to take some PO.     Objective:     Vital signs in last 24 hours:  Temp:  [36.6 ??C-37 ??C] 36.8 ??C  Heart Rate:  [74-101] 82  SpO2 Pulse:  [65-102] 78  Resp:  [16-28] 22  BP: (102-124)/(54-76) 112/70  MAP (mmHg):  [68-93] 82  SpO2:  [98 %-100 %] 100 %  Vitals:    09/18/19 1100   Weight: 53.9 kg (118 lb 13.3 oz)         Intake/Output last 3 shifts:  I/O last 3 completed shifts:  In: 3811.3 [P.O.:720; I.V.:2791.3; IV Piggyback:300]  Out: 1480 [Urine:280; Stool:1200]    Physical Exam:  General: cachectic teen, laying in bed, soft spoken but appropriate, shivering   HEENT: normocephalic, atraumatic, moist mucous membranes, EOMI, scleral icterus, nares clear, oropharynx with thrush  RESP: no tachypnea, shallow breathing with greatly diminished breath sounds in the bases  CV: regular rate and rhythm, normal S1/S2, no murmurs/rub/gallop, 2+ peripheral pulses  ABD: firm, distended, + fluid wave, active bowel sounds, bandage from paracentesis c/d/i  NEURO: awake and alert, asterixis noted, AxOx4, answering questions appropriately  EXT: 2+ pitting edema in bilateral lower extremities, 2+ radial pulses  DERM: acne, tinea versicolor    Medications:  Scheduled Meds:  ??? benzoyl peroxide   Topical Nightly   ??? calcium carbonate  300 mg elem calcium Oral BID   ??? cefTRIAXone  1 g Intravenous Q24H   ??? cholecalciferol (vitamin D3)  5,000 Units Oral Daily   ??? clindamycin   Topical BID   ??? insulin glargine  5 Units Subcutaneous Nightly   ??? insulin lispro  0-10 Units Subcutaneous 4x Daily   ??? insulin lispro  3-6 Units Subcutaneous 4x Daily   ??? ketoconazole   Topical Daily   ??? lactulose  30 g Oral TID   ??? magnesium oxide  400 mg Oral Daily   ??? melatonin  3 mg Oral QPM   ??? metroNIDAZOLE  500 mg Oral TID   ??? multivitamin, with zinc  2 tablet Oral Daily   ??? mupirocin   Topical TID   ??? mycophenolate  540 mg Oral BID   ??? nystatin  1,000,000 Units Oral TID   ??? pantoprazole  40 mg Oral Daily   ??? potassium & sodium phosphates 250mg   2 packet Oral TID   ??? predniSONE  40 mg Oral Daily   ??? rifAXIMin  550 mg Oral BID   ??? sulfamethoxazole-trimethoprim  1 tablet Oral Q MWF   ??? valGANciclovir  450 mg Oral Daily  Continuous Infusions:  PRN Meds:.dextrose 50 % in water (D50W), glucose, ondansetron, polyethylene glycol      Studies: Personally reviewed and interpreted.    Labs: reviewed and significant for WBC 5.8, Cr 1.22, Glucose 175, Albumin 2.4, AST 165, ALT hemolyzed, Alk Phos 832    Imaging: reviewed and significant for R pleural effusion on echo, no endocarditis, large volume ascites and mild increase in RI in transplant arteries, nl head CT    =======================================================  Randall Hiss, MD  Contact: [phone]      I performed a history and physical examination of the patient and discussed the patient's management with the family and the resident. I personally reviewed the results of all tests, including blood work and images. I reviewed and edited the resident's note and agree with the documented findings and plan of care. I provided direct supervision to the care of the patient. I spent over 50% of a total 45 min counseling and/or coordinating the care of this patient.     Rocco Pauls, MD  Pediatric Gastroenterology

## 2019-09-21 LAB — COMPREHENSIVE METABOLIC PANEL
ALBUMIN: 2.8 g/dL — ABNORMAL LOW (ref 3.5–5.0)
ALKALINE PHOSPHATASE: 784 U/L — ABNORMAL HIGH (ref 65–260)
AST (SGOT): 147 U/L — ABNORMAL HIGH (ref 19–55)
BLOOD UREA NITROGEN: 24 mg/dL — ABNORMAL HIGH (ref 7–21)
BUN / CREAT RATIO: 19
CALCIUM: 8.3 mg/dL — ABNORMAL LOW (ref 8.5–10.2)
CREATININE: 1.29 mg/dL (ref 0.70–1.30)
EGFR CKD-EPI AA MALE: >90 mL/min/{1.73_m2} (ref >=60)
EGFR CKD-EPI NON-AA MALE: 80 mL/min/{1.73_m2} (ref >=60)
GLUCOSE RANDOM: 136 mg/dL (ref 70–179)
POTASSIUM: 4.2 mmol/L (ref 3.5–5.0)
SODIUM: 138 mmol/L (ref 135–145)

## 2019-09-21 LAB — APTT: Coagulation surface induced:Time:Pt:PPP:Qn:Coag: 34.3

## 2019-09-21 LAB — PHOSPHORUS: Phosphate:MCnc:Pt:Ser/Plas:Qn:: 4.3

## 2019-09-21 LAB — BILIRUBIN DIRECT: Bilirubin.glucuronidated+Bilirubin.albumin bound:MCnc:Pt:Ser/Plas:Qn:: 23.3 — ABNORMAL HIGH

## 2019-09-21 LAB — TACROLIMUS, TROUGH: Lab: 3.5 — ABNORMAL LOW

## 2019-09-21 LAB — CMV IGG: Lab: POSITIVE — AB

## 2019-09-21 LAB — HERPES SIMPLEX VIRUS 1 IGG: Lab: NEGATIVE

## 2019-09-21 LAB — GAMMA GLUTAMYL TRANSFERASE: Gamma glutamyl transferase:CCnc:Pt:Ser/Plas:Qn:: 388 — ABNORMAL HIGH

## 2019-09-21 LAB — HSV ANTIBODIES, IGG: HERPES SIMPLEX VIRUS 1 IGG: NEGATIVE

## 2019-09-21 LAB — CHLORIDE: Chloride:SCnc:Pt:Ser/Plas:Qn:: 110 — ABNORMAL HIGH

## 2019-09-21 LAB — INR: Coagulation tissue factor induced.INR:RelTime:Pt:PPP:Qn:Coag: 1.5

## 2019-09-21 LAB — ANTITHROMB III, FUNC: Lab: 66 — ABNORMAL LOW

## 2019-09-21 LAB — MAGNESIUM: Magnesium:MCnc:Pt:Ser/Plas:Qn:: 1.5 — ABNORMAL LOW

## 2019-09-21 NOTE — Unmapped (Signed)
[name] Gross is afebrile, VSS on room air. 1 unit humilin given for midnight bg of 193. PIV C/D/I. Voiding appropriately, but keeps flushing instead of using urinal. RN reminded him to use urinal every time. Grandma is at bedside and involved in care. Will continue to monitor and follow plan of care.    Problem: Adult Inpatient Plan of Care  Goal: Plan of Care Review  Outcome: Progressing  Goal: Patient-Specific Goal (Individualization)  Outcome: Progressing  Goal: Absence of Hospital-Acquired Illness or Injury  Outcome: Progressing  Goal: Optimal Comfort and Wellbeing  Outcome: Progressing  Goal: Readiness for Transition of Care  Outcome: Progressing  Goal: Rounds/Family Conference  Outcome: Progressing     Problem: Self-Care Deficit  Goal: Improved Ability to Complete Activities of Daily Living  Outcome: Progressing     Problem: Fall Injury Risk  Goal: Absence of Fall and Fall-Related Injury  Outcome: Progressing     Problem: Wound  Goal: Optimal Wound Healing  Outcome: Progressing     Problem: Adjustment to Illness (Liver Failure)  Goal: Optimal Coping with Liver Failure  Outcome: Progressing     Problem: Neurologic Function Impaired (Liver Failure)  Goal: Optimal Neurologic Function  Outcome: Progressing     Problem: Oral Intake Inadequate (Liver Failure)  Goal: Optimal Nutrition Intake  Outcome: Progressing

## 2019-09-21 NOTE — Unmapped (Signed)
IMMUNOCOMPROMISED HOST INFECTIOUS DISEASE PROGRESS NOTE    [name] Gross is being seen in consultation at the request of Morrison Old, MD for evaluation of liver transplant.   ??  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  Pertinent Exposure History  Recent homelessness 07/2019, resolved  MSM last 2016  Cruise to [place] 11/2018  MJ use- patient denies but positive Utox in 07/2019  ??  Pertinent Co-morbidities  Depression with multiple admissions for SA in 2019  Steroid induced diabetes  Malnutrition  Pityrosporum folliculitis/steroidal induced acne, resolving  ??  Drug Intolerances  None  ??  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  #MSSA/S mitis bacteremia with possible abdominal (peritonitis?)  vs. CLABSI 08/22/2019  -cleared cultures (CVAD and periphery) by day 2 abx  -vanc/ceftriaxone 10/12-10/16->unasyn 10/16-10/26  -TTE wnl 10/16  -PICC removed at discharge 09/10/19    #Fever, AMS, RUQ pain c/f cholangitis vs RUE DVT; AMS 2/2 liver disease progression, resolved 09/17/2019  -negative paracentesis 48 hours into abx for SBP  -09/17/19 blood cultures NGTD  -US abdomen with ascites and chronic liver changes but without signs of pancreatitis  -ceftriaxone/flagyl 11/7-  -repeat Echo without changes  -crypto ag negative  -EBV, HSV VL wnl  -CMV VL 190- likely DNAemia in acute illness    #R parapneumonic effusion likely 2/2 ESLD 09/18/19    #At risk for STIs s/p screening 09/18/19  - HIV negative  - urine gon/chlam negative  - oral gon/chlam pending  - RPR negative    #Oral candidiasis 09/10/19, 09/17/19  -responds to nystatin  -no cultures  -+fungitell may be related to resolving oral candidiasis (not present on exam at signoff)  -EGD on 09/21/19 done to evaluate for varices, no evidence for candidiasis    Recommendations  For concern for abdominal source of infection:  -Continue Ceftriaxone 1g IV q24 + Flagyl 500 mg PO TID with plans to treat for 10 days total (end date 09/26/19)   -if plans for discharge prior to date of completion can move to Levaquin 750 mg PO daily + metronidazole 500 mg TID PO    ??For oral candidiasis (mild disease)  -agree with continuing nystatin, noted improvement- continue course for total of 14 days (end date 10/21/2)  -if worsening, please send fungal culture and request susceptibilities    For CMV DNAemia:  -Check CMV every week, if continues to rise, he may need higher dose valcyte  ??  For ppx in immunocompromised host:  -Continue Valagancyclovir ppx 450 mg daily per protocol  -continue bactrim ppx  ??  For other pre-transplant workup:  -f/u gonorrhea/chlamydia from urine and from oropharynx swab  -f/u CMV IgG  -f/u HSV IgG (as discordance with immune status in our system)  -Please give Pneumovax (PCV  23) if patient has not received--not noted in system; otherwise review of vaccinations is up to date    Peri-transplant antimicrobial recommendations  - as per protocol    For information pre-transplant note please see consult from 09/20/19.     The [facility] ID service will sign off.   Please page the ID Transplant/Liquid Oncology Fellow consult at [phone] with questions.  Patient discussed with Dr. Pamelia Hoit C. Rachal, PGY6  Adult/Peds Infectious Diseases Fellow        Subjective:   Afebrile  Went to EGD this am  Patient states feels ok, no belly pain nausea/emesis  Denies dysphagia    Review of Systems  All other systems reviewed are negative.          Objective:      Vital Signs last 24 hours:  Temp:  [36.3 ??C-37.1 ??C] 36.7 ??C  Heart Rate:  [78-98] 81  Resp:  [20-30] 24  BP: (105-120)/(56-70) 111/65  MAP (mmHg):  [69-84] 79  SpO2:  [100 %] 100 %    Physical Exam:  Patient Lines/Drains/Airways Status    Active Active Lines, Drains, & Airways     Name:   Placement date:   Placement time:   Site:   Days:    Peripheral IV 09/18/19 Left Forearm   09/18/19    0219    Forearm   3              GEN:  thin, small for age, diffuse rash on face (see derm); constricted affect  EYES: PERRL, EOMI and icteric sclera  AVW:[id number] thrush with no other oral lesions  LYMPH:no cervical, supraclavicular, axillary, or inguinal LAD  NW:GNFA are swollen, RRR and no abnormal heart sounds noted  PULM:normal work of breathing at rest, CTAB anteriorly and CTAB posteriorly  GI:+fluid wave with noted mod ascites, mild ttp through over RUQ   OZ:[id number]  RECTAL:deferred  SKIN:hyperpigmented papules c/f steroid induced acne vs. follicultitis over face (pitryosporoum?); back with scaling and hypopigmentation c/f tinea versicolor  MSK:no vertebral point tenderness, no swollen joints and full neck ROM  NEURO:intention tremor noted but without asterixis, facial expression symmetric and moves extremities equally  PSYCH:attentive, appropriate affect, good eye contact, fluent speech    Labs:  Lab Results   Component Value Date    WBC 6.4 09/20/2019    WBC 6.2 09/15/2019    WBC 2.8 (L) 02/11/2019    HGB 8.6 (L) 09/20/2019    HGB 13.0 02/11/2019    Hemoglobin 9.6 (L) 09/22/2016    HCT 26.5 (L) 09/20/2019    HCT 40.4 02/11/2019    Platelet 373 09/20/2019    Platelet 197 02/11/2019    Absolute Neutrophils 5.4 09/20/2019    Absolute Neutrophils 0.6 (L) 02/11/2019    Absolute Lymphocytes 0.3 (L) 09/20/2019    Absolute Lymphocytes 2.0 02/11/2019    Absolute Eosinophils 0.0 09/20/2019    Absolute Eosinophils 0.1 02/11/2019    Sodium 138 09/21/2019    Sodium 139 02/11/2019    Sodium Whole Blood 142 09/22/2016    Potassium 4.2 09/21/2019    Potassium 4.3 02/11/2019    Potassium, Bld 4.0 09/22/2016    BUN 24 (H) 09/21/2019    BUN 24 (H) 09/15/2019    Creatinine 1.29 09/21/2019    Creatinine 1.40 (H) 02/11/2019    Glucose 136 09/21/2019    Magnesium 1.4 (L) 09/20/2019    Magnesium 1.7 02/11/2019    Albumin 2.8 (L) 09/21/2019  Total Bilirubin >27.0 (H) 09/21/2019    Total Bilirubin 1.2 02/11/2019    AST 147 (H) 09/21/2019    AST 214 (H) 02/11/2019    ALT  09/21/2019      Comment:      Specimen icteric.    ALT 219 (H) 02/11/2019    Alkaline Phosphatase 784 (H) 09/21/2019    Alkaline Phosphatase 368 (H) 02/11/2019    INR 1.50 09/21/2019    Sed Rate 25 (H) 08/22/2019    CRP  09/02/2019      Comment:      Specimen icteric.         Microbiology:  Past cultures were reviewed in Epic and CareEverywhere.      Imaging:    Independent visualization of images: I independently reviewed the image from 09/20/19 and I agree with the findings/interpretation.

## 2019-09-21 NOTE — Unmapped (Addendum)
Follow-up Pediatric Palliative Care Consult Note:  ??  [name] Gross is a 18 y.o. male seen for pediatric palliative care consultation at the request of Dr. Donzetta Sprung for goals of care, advance care planning, and family support.  ??  Primary Care Provider: [name], MD  ??  History provided by: Patient and paternal grandmother  ??  Assessment:  ??  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 [name] Gross was encouraged to take measures to improve his candidacy for liver transplant, including addressing social barriers and receiving mental health counseling. He is readmitted with fever, fatigue, AKI.  Palliative care consult requested for goals of care, advance care planning, and family support.     ??  Summary of Discussion and Recommendations:   ??  1. SYMPTOMS:   - Damarri denies pain, nausea, poor appetite, insomnia, or changes in mood  - Fever source uncertain which frustrates him but he is relieved to know that it's not due to some things that he was worried about  - Hansen would like assistance with a plan for mental health follow up. He is amenable to continuing to follow with [facility] psychologist if possible (telehealth and/or in person) or with someone in his community but would benefit from assistance with making a plan  - Mental status remains tough to assess at times. Short term memory seems affected - unclear if by his chronic liver disease, acute illness, depression, or a combination   ??  2. GOALS:   - [name] hopes to recover and return to his new home with his paternal grandmother - he is adjusting to a new living environment and has been allowing family members, including his mother, back into his life to support him and assist with his care  - [name] has long term goals which include going back to school and becoming a Runner, broadcasting/film/video or nurse  - Unclear whether redo transplant is a goal for him - he remains somewhat ambivalent but has expressed interest in working toward this/keeping it an option. He will clearly need continued support, clear expectations, and the opportunity to discuss/address any challenges that arise with the transplant process  ??  3. DECISIONS:   - Information/communication preferences: [name] Gross likes to receive information directly. As above, some concerns about him being able to retain some information which may be situational when in the hospital but could also be related to his underlying condition and mood. He will benefit from continued support from his family and surrogate decision makers.  - Transplant: [name] Gross is aware that redo transplant may be an option and has addressed some of the psychosocial barriers to this already, but he understandably expresses a bit of ambivalence right now - suspect this is due to illness, many changes in his life, mood. He will need continued support around this decision.   - [facility] Decision Making Tool given and explained at a previous visit. He was encouraged to use this tool along with an ACP guide to document information he receives from providers.   ??  4. ADVANCE CARE PLANNING  - [name] Gross has designated his friend Stephenie Acres and grandfather Liana Gerold as surrogate decision makers.  - Code status: Full code. Not discussed today. [name] Gross is considering his wishes around life-sustaining treatments using an ACP guide and is likely to need additional  information about scenarios we might anticipate if his health continues to decline.   - Prognosis: Guarded given severity of liver disease and potential for lack of response to salvage therapies, AKI, and complex social situation with lack of caregiver support.  ??  5. SUPPORT:   - [name] Gross described limited personal supports during previous admission but has expanded his support network by reconnecting with many family members who have expressed their desire to support him and assist with his care. Currently living with [name]. Mother is involved again and helping with transportation and appointments.   - [name] Gross would benefit from community palliative care as he navigates decisions about his health care and to assist with symptom management. Will explore options in their community and can coordinate with primary team/CM to discuss this possibility with Thermon and grandmother and to make referral if they wish  ??  6. CARE COORDINATION:   - Care coordinated with nursing, GI attending Dr. Cheri Rous        Total time spent with patient for evaluation & management (excluding ACP documented separately): 40 minutes.  Greater than 50% of this time spent on counseling/coordination of care: Yes.  See ACP Note from today for additional billable service: Yes.     We appreciate the consult. The Children's Supportive Care Team can be reached by pager [name]) or email ([email or web address]).   ??  ??  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     He is disappointed to have been readmitted so quickly. Chart notes he had another ER visit and was advised to stay but left AMA. We did not discuss. He is happy to be feeling better and doesn't recall results of his EGD today. He is happy to be following with Dr. Artemio Aly of Psychology and finds these visits helpful.     See above for details of visit.   ??      Past Medical History:   Diagnosis Date   ??? 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 Depression    ??? MSSA bacteremia 08/27/2019   ??? Seasonal allergies    ??? Sickle cell trait (CMS-HCC)    ??? Strep Mitis Central line-associated bloodstream infection 08/27/2019     Past Surgical History:   Procedure Laterality Date   ??? FRENULECTOMY, LINGUAL     ??? PR ERCP REMOVE FOREIGN BODY/STENT BILIARY/PANC DUCT N/A 08/20/2017    Procedure: ENDOSCOPIC RETROGRADE CHOLANGIOPANCREATOGRAPHY (ERCP); W/ REMOVAL OF FOREIGN BODY/STENT FROM BILIARY/PANCREATIC DUCT(S);  Surgeon: Vonda Antigua, MD;  Location: GI PROCEDURES MEMORIAL [facility];  Service: Gastroenterology   ??? PR ERCP REMOVE FOREIGN BODY/STENT BILIARY/PANC DUCT N/A 06/04/2018  Procedure: ENDOSCOPIC RETROGRADE CHOLANGIOPANCREATOGRAPHY (ERCP); W/ REMOVAL OF FOREIGN BODY/STENT FROM BILIARY/PANCREATIC DUCT(S);  Surgeon: Chriss Driver, MD;  Location: GI PROCEDURES MEMORIAL [facility];  Service: Gastroenterology   ??? PR ERCP STENT PLACEMENT BILIARY/PANCREATIC DUCT N/A 11/21/2016    Procedure: ENDOSCOPIC RETROGRADE CHOLANGIOPANCREATOGRAPHY (ERCP); WITH PLACEMENT OF ENDOSCOPIC STENT INTO BILIARY OR PANCREATIC DUCT;  Surgeon: Vonda Antigua, MD;  Location: GI PROCEDURES MEMORIAL [facility];  Service: Gastroenterology   ??? PR ERCP,W/REMOVAL STONE,BIL/PANCR DUCTS N/A 08/20/2017    Procedure: ERCP; W/ENDOSCOPIC RETROGRADE REMOVAL OF CALCULUS/CALCULI FROM BILIARY &/OR PANCREATIC DUCTS;  Surgeon: Vonda Antigua, MD;  Location: GI PROCEDURES MEMORIAL [facility];  Service: Gastroenterology   ??? PR ERCP,W/REMOVAL STONE,BIL/PANCR DUCTS N/A 06/04/2018    Procedure: ERCP; W/ENDOSCOPIC RETROGRADE REMOVAL OF CALCULUS/CALCULI FROM BILIARY &/OR PANCREATIC DUCTS;  Surgeon: Chriss Driver, MD;  Location: GI PROCEDURES MEMORIAL [facility];  Service: Gastroenterology   ??? PR TRANSPLANT LIVER,ALLOTRANSPLANT N/A 09/22/2016    Procedure: LIVER ALLOTRANSPLANTATION; ORTHOTOPIC, PARTIAL OR WHOLE, FROM CADAVER OR LIVING DONOR, ANY AGE;  Surgeon: Doyce Loose, MD;  Location: MAIN OR [facility];  Service: Transplant   ??? PR TRANSPLANT,PREP DONOR LIVER, WHOLE N/A 09/22/2016    Procedure: [facility] STD PREP CAD DONOR WHOLE LIVER GFT PRIOR TNSPLNT,INC CHOLE,DISS/REM SURR TISSU WO TRISEG/LOBE SPLT;  Surgeon: Doyce Loose, MD;  Location: MAIN OR [facility];  Service: Transplant   ??? PR UPGI ENDOSCOPY W/US FN BX N/A 08/20/2017    Procedure: UGI W/ TRANSENDOSCOPIC ULTRASOUND GUIDED INTRAMURAL/TRANSMURAL FINE NEEDLE ASPIRATION/BIOPSY(S), ESOPHAGUS;  Surgeon: Vonda Antigua, MD;  Location: GI PROCEDURES MEMORIAL [facility];  Service: Gastroenterology   ??? PR UPPER GI ENDOSCOPY,BIOPSY N/A 07/15/2016    Procedure: UGI ENDOSCOPY; WITH BIOPSY, SINGLE OR MULTIPLE;  Surgeon: Arnold Long Mir, MD;  Location: PEDS PROCEDURE ROOM [facility];  Service: Gastroenterology   ??? PR UPPER GI ENDOSCOPY,CTRL BLEED Left 05/05/2016    Procedure: UGI ENDOSCOPY; WITH CONTROL OF BLEEDING, ANY METHOD;  Surgeon: Arnold Long Mir, MD;  Location: PEDS PROCEDURE ROOM [facility];  Service: Gastroenterology   ??? PR UPPER GI ENDOSCOPY,CTRL BLEED N/A 05/12/2016    Procedure: UGI ENDOSCOPY; WITH CONTROL OF BLEEDING, ANY METHOD;  Surgeon: Annie Paras, MD;  Location: CHILDRENS OR [facility];  Service: Gastroenterology     Family History   Problem Relation Age of Onset   ??? Diabetes Maternal Grandmother    ??? Diabetes Paternal Grandmother    ??? Hypertension Maternal Grandfather    ??? Hypertension Paternal Grandfather    ??? Asthma Brother    ??? Anesthesia problems Neg Hx    ??? Melanoma Neg Hx    ??? Basal cell carcinoma Neg Hx    ??? Squamous cell carcinoma Neg Hx      Social History: High school graduate. Would like to be a Runner, broadcasting/film/video or a Engineer, civil (consulting). Has had struggles with homelessness. now living with [name]. Reconnecting with extended family during/after last admission.     Allergies:  Bee pollen and Pollen extracts  ??  Medications:  Scheduled Meds:  ??? benzoyl peroxide   Topical Nightly   ??? calcium carbonate  300 mg elem calcium Oral BID   ??? cefTRIAXone  1 g Intravenous Q24H   ??? cholecalciferol (vitamin D3)  5,000 Units Oral Daily   ??? clindamycin   Topical BID   ??? insulin glargine  5 Units Subcutaneous Nightly   ??? insulin lispro  0-5 Units Subcutaneous ACHS   ??? insulin lispro  2-4 Units Subcutaneous ACHS   ??? ketoconazole   Topical Daily   ???  magnesium oxide  400 mg Oral Daily   ??? melatonin  3 mg Oral QPM   ??? metroNIDAZOLE  500 mg Oral TID   ??? multivitamin, with zinc  2 tablet Oral Daily   ??? mupirocin   Topical TID   ??? mycophenolate  540 mg Oral BID   ??? nystatin  1,000,000 Units Oral TID   ??? pantoprazole  40 mg Oral Daily   ??? potassium & sodium phosphates 250mg   2 packet Oral TID   ??? predniSONE  40 mg Oral Daily   ??? rifAXIMin  550 mg Oral BID   ??? sulfamethoxazole-trimethoprim  1 tablet Oral Q MWF   ??? valGANciclovir  450 mg Oral Daily     Continuous Infusions:  ??? lactated Ringers 10 mL/hr (09/21/19 1012)     PRN Meds:.dextrose 50 % in water (D50W), glucagon, glucose, lactulose, ondansetron    Physical Exam:   Vitals:    09/21/19 1501   BP: 112/64   Pulse: 79   Resp: 16   Temp: 36.4 ??C (97.5 ??F)   SpO2: 100%   ??  Gen: thin young man walking around the room in no distress  Skin: jaundiced  HEENT: +scleral icterus, OP clear, MMM   Chest: normal work of breathing  Abd: protruberant  Psych: affect somewhat flat post-procedure but engages in conversation    Data: Reviewed test results in Epic.        Waldon Reining, MD, MPH  Attending Physician, Children's Supportive Care Team

## 2019-09-21 NOTE — Unmapped (Signed)
PHYSICAL THERAPY  Evaluation (09/21/19 1345)     Patient Name:  [name] Gross       Medical Record Number: [id number]   Date of Birth: 07/13/2001  Sex: Male            Treatment Diagnosis: [name] Gross  is a 18 y.o. 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 He presents to physical therapy with appropriate functional mobility for bed mobility and transfers. He is educated on three exercises to address LE and overall strengthening and is encouraged to perform at least 1x/day; a handout is provided as well. Pt will benefit from skilled acute PT to progress mobility and maximize function.     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, admitted for concerns of bacterial peritonitis, RUE DVT   Examination of Body System: 3-5 elements   Body System: neuromuscular, cardiopulmonary, musculoskeletal, activity/participation   Clinical Decision Making: Moderate     PLAN  Planned Frequency of Treatment:  1x per day for: 2-3x week Planned Treatment Duration: duration of admission or until STGs met    Planned Interventions: Education - Patient, Functional mobility, Therapeutic activity, Therapeutic exercise, Stair training, Self-care / Home training, Gait training, Home exercise program, Endurance activities    Post-Discharge Physical Therapy Recommendations:  To be determined, Pediatrics only-Outpatient Therapy    PT DME Recommendations: None           Goals:   Patient and Family Goals: none stated    Long Term Goal #1: Pt will return to independent community ambulation in 1 month.    SHORT GOAL #1: Pt will be able to ambulate x 500 ft independently.              Time Frame : 2 weeks  SHORT GOAL #2: Pt will tolerate x 20 mins of therapeutic activity without rest break.              Time Frame : 2 weeks  SHORT GOAL #3: Pt will be independent and compliant with HEP.              Time Frame : 2 weeks    Prognosis:  Good  Barriers to Discharge: Other    SUBJECTIVE  Patient reports: RN and pt agreeable to PT. No family present at bedside. He reports that he is mobilizing well to/from bathroom.     Current Functional Status: Activity Level: No Restrictions     Prior Functional Status: Pt reports independence w/ ambulation and functional mobility at baseline. Ambulating to/from bathroom and to chair this admission.  Equipment available at home: None     Past Medical History:   Diagnosis Date   ??? Acute rejection of liver transplant (CMS-HCC) 08/20/2017    Moderate acute cellular rejection  with prominent centrilobular venulitis, RAI = 7/9 (portal inflammation: 3, bile duct inflammation/damage: 1, venous endothelial inflammation: 3)   ??? Depression    ??? MSSA bacteremia 08/27/2019   ??? Seasonal allergies    ??? Sickle cell trait (CMS-HCC)    ??? Strep Mitis Central line-associated bloodstream infection 08/27/2019    Social History     Tobacco Use   ??? Smoking status: Never Smoker   ??? Smokeless tobacco: Never Used   Substance Use Topics   ??? Alcohol use: Never     Alcohol/week: 0.0 standard drinks     Frequency: Never     Comment: 1/2 glass of wine on rare occasions      Past Surgical History:   Procedure Laterality Date   ??? FRENULECTOMY, LINGUAL     ??? PR ERCP REMOVE FOREIGN BODY/STENT BILIARY/PANC DUCT N/A 08/20/2017    Procedure: ENDOSCOPIC RETROGRADE CHOLANGIOPANCREATOGRAPHY (ERCP); W/ REMOVAL OF FOREIGN BODY/STENT FROM BILIARY/PANCREATIC DUCT(S);  Surgeon: Vonda Antigua, MD;  Location: GI PROCEDURES MEMORIAL [facility];  Service: Gastroenterology   ??? PR ERCP REMOVE FOREIGN BODY/STENT BILIARY/PANC DUCT N/A 06/04/2018    Procedure: ENDOSCOPIC RETROGRADE CHOLANGIOPANCREATOGRAPHY (ERCP); W/ REMOVAL OF FOREIGN BODY/STENT FROM BILIARY/PANCREATIC DUCT(S);  Surgeon: Chriss Driver, MD;  Location: GI PROCEDURES MEMORIAL [facility];  Service: Gastroenterology   ??? PR ERCP STENT PLACEMENT BILIARY/PANCREATIC DUCT N/A 11/21/2016    Procedure: ENDOSCOPIC RETROGRADE CHOLANGIOPANCREATOGRAPHY (ERCP); WITH PLACEMENT OF ENDOSCOPIC STENT INTO BILIARY OR PANCREATIC DUCT;  Surgeon: Vonda Antigua, MD;  Location: GI PROCEDURES MEMORIAL [facility];  Service: Gastroenterology   ??? PR ERCP,W/REMOVAL STONE,BIL/PANCR DUCTS N/A 08/20/2017    Procedure: ERCP; W/ENDOSCOPIC RETROGRADE REMOVAL OF CALCULUS/CALCULI FROM BILIARY &/OR PANCREATIC DUCTS;  Surgeon: Vonda Antigua, MD;  Location: GI PROCEDURES MEMORIAL [facility];  Service: Gastroenterology   ??? PR ERCP,W/REMOVAL STONE,BIL/PANCR DUCTS N/A 06/04/2018    Procedure: ERCP; W/ENDOSCOPIC RETROGRADE REMOVAL OF CALCULUS/CALCULI FROM BILIARY &/OR PANCREATIC DUCTS;  Surgeon: Chriss Driver, MD;  Location: GI PROCEDURES MEMORIAL [facility];  Service: Gastroenterology   ??? PR TRANSPLANT LIVER,ALLOTRANSPLANT N/A 09/22/2016    Procedure: LIVER ALLOTRANSPLANTATION; ORTHOTOPIC, PARTIAL OR WHOLE, FROM CADAVER OR LIVING DONOR, ANY AGE;  Surgeon: Doyce Loose, MD;  Location: MAIN OR [facility];  Service: Transplant   ??? PR TRANSPLANT,PREP DONOR LIVER, WHOLE N/A 09/22/2016    Procedure: [facility] STD PREP CAD DONOR WHOLE LIVER GFT PRIOR TNSPLNT,INC CHOLE,DISS/REM SURR TISSU WO TRISEG/LOBE SPLT;  Surgeon: Doyce Loose, MD;  Location: MAIN OR [facility]; Service: Transplant   ??? PR UPGI ENDOSCOPY W/US FN BX N/A 08/20/2017    Procedure: UGI W/ TRANSENDOSCOPIC ULTRASOUND GUIDED INTRAMURAL/TRANSMURAL FINE NEEDLE ASPIRATION/BIOPSY(S), ESOPHAGUS;  Surgeon: Vonda Antigua, MD;  Location: GI PROCEDURES MEMORIAL [facility];  Service: Gastroenterology   ??? PR UPPER GI ENDOSCOPY,BIOPSY N/A 07/15/2016    Procedure: UGI ENDOSCOPY; WITH BIOPSY, SINGLE OR MULTIPLE;  Surgeon: Arnold Long Mir, MD;  Location: PEDS PROCEDURE ROOM [facility];  Service: Gastroenterology   ??? PR UPPER GI ENDOSCOPY,CTRL BLEED Left 05/05/2016    Procedure: UGI ENDOSCOPY; WITH CONTROL OF BLEEDING, ANY METHOD;  Surgeon: Arnold Long Mir, MD;  Location: PEDS PROCEDURE ROOM [facility];  Service: Gastroenterology   ??? PR UPPER GI ENDOSCOPY,CTRL BLEED N/A 05/12/2016    Procedure: UGI ENDOSCOPY; WITH CONTROL OF BLEEDING, ANY METHOD;  Surgeon: Annie Paras, MD;  Location: CHILDRENS OR [facility];  Service: Gastroenterology    Family History   Problem Relation Age of Onset   ??? Diabetes Maternal Grandmother    ??? Diabetes Paternal Grandmother    ???  Hypertension Maternal Grandfather    ??? Hypertension Paternal Grandfather    ??? Asthma Brother    ??? Anesthesia problems Neg Hx    ??? Melanoma Neg Hx    ??? Basal cell carcinoma Neg Hx    ??? Squamous cell carcinoma Neg Hx         Allergies: Bee pollen and Pollen extracts     Objective Findings  Precautions / Restrictions  Precautions: Isolation precautions, Falls precautions(Enteric)  Weight Bearing Status: Non-applicable  Required Braces or Orthoses: Non-applicable    Communication Preference: Verbal   Pain Comments: denies pain, 0/10  Medical Tests / Procedures: reviewed EMR  Equipment / Environment: Vascular access (PIV, TLC, Port-a-cath, PICC)    At Rest: VSS  With Activity: VSS  Orthostatics: Asymptomatic    Living Situation  Living Environment: House  Lives With: Grandparent(Maternal grandmother)  Home Living: One level home, Stairs to enter with rails  Rail placement (outside): Bilateral rails  Number of Stairs: 4(pt reports 4th step is very high)     Cognition: Alert & oriented, age-appropriate  Visual / Perception Status: Grossly intact  Skin Inspection: Visible skin intact    UE ROM: WFL  UE Strength: WFL grossly, moving B UE's against gravity  LE ROM: WFL  LE Strength: WFL, grossly 5/5 throughout B LE's                Coordination: Grossly intact   Proprioception: Grossly intact  Sensation: Denies abnormal sensation, no N/T  Balance: Appropriate seated and standing with SBA      Bed Mobility: Independent supine <> sit  Transfers: SBA for sit <> stand   Gait  Level of Assistance: Standby assist, set-up cues, supervision of patient - no hands on  Assistive Device: None  Gait: Pt ambulates short distance at bedside with supervision-SBA  Stairs: NT         Physical Therapy Session Duration  PT Individual - Duration: 15    Medical Staff Made Aware: Katie, RN    I attest that I have reviewed the above information.  Signed: Curt Jews, PT  Filed 09/21/2019

## 2019-09-21 NOTE — Unmapped (Signed)
[facility]  Pediatric Hematology/Oncology Thrombosis Follow up Consult Note   Date: 09/21/19  Patient Name: [name] Gross  MRN: [id number]  Requesting Attending Physician: Morrison Old, MD  Service Requesting Consult : Ped Gastroenterology [facility])    Reason for consultation: new VTE    Assessment:      [name] Gross  is a 18 y.o. 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      [name] Gross likely suffered a provoked DVT (previous RUE PICC line which was removed 10/31). However, he was asymptomatic when DVT was found and it's unclear how long the thrombus has been present. Given this information, we recommend holding off on anticoagulation and repeating imaging on Friday 11/13 or Monday 11/16 for reassessment. If thrombus is stable or improving, continue to observe without anticoagulation. If thrombus worsens on imaging or pt becomes symptomatic prior to imaging, recommend repeating PVLs sooner and starting anticoagulation.     In light of liver issues, would recommend obtaining Protein C, Protein S and AT3 for evaluation.      VTE Diagnosis:    Deep vein thrombosis diagnosed based on Upper extremity dopplers dated 11/9.     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    Work-up:  A thrombophilia evaluation has not been pursued however we did recommend obtaining protein C, S and AT3 for evaluation.     D-dimer peak currently 713 on 11/10.     Treatment: [name] Gross initially had one dose of lovenos when DVT was found but upon further assessment we recommended NOT starting/continuing on anticoagulation given asymptomatic for DVT and unclear how long this thrombus has been present. Watchful waiting with close f/u on imaging recommended (repeat US in 3-5 days for reassessment and evaluation of possible need for anticoagulation.)     Patient Active Problem List   Diagnosis   ??? Adjustment disorder with anxious mood   ??? Recurrent major depressive disorder, in partial remission (CMS-HCC)   ??? Malnutrition of mild degree (CMS-HCC)   ??? History of liver transplant (CMS-HCC)   ??? Allergic rhinitis   ??? Transaminitis   ??? History of suicide attempt   ??? 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 Acute rejection of liver transplant (CMS-HCC)   ??? Ingestion of multiple medications   ??? Liver transplant rejection (CMS-HCC)   ??? Normocytic anemia   ??? AKI (acute kidney injury) (CMS-HCC)   ??? Electrolyte disturbance   ??? Hypoalbuminemia   ??? H/O liver transplant (CMS-HCC)   ??? 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 High serum gamma glutamyl transferase (GGT)   ??? Steroid-induced hyperglycemia   ??? Pityrosporum folliculitis   ??? Bilateral knee swelling   ??? Homeless   ??? Thrombocytopenia (CMS-HCC)   ??? Hypomagnesemia   ??? Hypophosphatasia   ??? Other ascites   ??? Hyperbilirubinemia   ??? Impetigo lesions of Right Ear and Posterior Neck   ??? Steroid-induced acne   ??? 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 Irritant contact dermatitis  of the hands   ??? Sleep difficulties   ??? Fever        Plan:        #VTE: given asymptomatic nature and unclear when thrombus developed, watchful waiting recommended.  -repeat imaging in 3-5 days (Friday 11/13 or Mon 11/16)  -If thrombus stable/improved on imaging, continue to observe without anticoagulation  -If thrombus extension seen on imaging, initiate anticoagulation  -If pt becomes symptomatic prior to repeat imaging, obtain sooner [place] and initiate anticoagulation    #Obtain protein C, protein S and AT3 at next lab draw for assessment    #Anticoagulation Monitoring if needed:  CBC, Crt, D-Dimer, antiXa level weekly or sooner if changes in clinical status.  - Ensure platelets > 50K while on AC  -if anticoagulation needed, lovenox 1mg /kg reasonable option with goal anti-xa 0.5-1IU/mL after third or fourth dose    #If anticoagulation is continued at time of discharge, will follow up with peds thrombosis clinic on Tues or Thurs at noon within 1-2 weeks of being discharged.    Thank you for allowing [place] to be involved in the care of [name] Gross.  If there are any questions, please contact the peds heme onc consult pager. We will continue to follow along with you.        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 Patient was transfered to acute care floor on 11/9. He is currently undergoing empiric treatment for SBP, management of hepatic encephalopathy, fluid management, further infectious workup of fever and medication monitoring.     Was found to have RUE DVT on PVLs  11/9. Unclear if clot is new or old (had a previous PICC to RUE that was removed 10/31) and pt was asymptomatic when found. It is unclear why PVLs were obtained but thought is that because pt had anasarca, four extremity PVLs were obtained for further assessment.      Interval: Pt reports no pain, swelling or redness to RUE.     Medications:  Scheduled Meds:  ??? benzoyl peroxide   Topical Nightly   ??? calcium carbonate  300 mg elem calcium Oral BID   ??? cefTRIAXone  1 g Intravenous Q24H   ??? cholecalciferol (vitamin D3)  5,000 Units Oral Daily   ??? clindamycin   Topical BID   ??? insulin regular  0-5 Units Subcutaneous Q4H   ??? ketoconazole   Topical Daily   ??? magnesium oxide 400 mg Oral Daily   ??? melatonin  3 mg Oral QPM   ??? metroNIDAZOLE  500 mg Oral TID   ??? multivitamin, with zinc  2 tablet Oral Daily   ??? mupirocin   Topical TID   ??? mycophenolate  540 mg Oral BID   ??? nystatin  1,000,000 Units Oral TID   ??? pantoprazole  40 mg Oral Daily   ??? potassium & sodium phosphates 250mg   2 packet Oral TID   ??? predniSONE  40 mg Oral Daily   ??? rifAXIMin  550 mg Oral BID   ??? sulfamethoxazole-trimethoprim  1 tablet Oral Q MWF   ??? valGANciclovir  450 mg Oral Daily       Scheduled Infusions:      PRN Meds:  dextrose 50 % in water (D50W),  glucagon, glucose, lactulose, ondansetron       Objective:   Objective:   Physical Exam:   Vitals:   Vitals:    09/20/19 2140 09/20/19 2341 09/21/19 0421 09/21/19 0900   BP: 119/60 106/60 111/65 124/59   Pulse: 88 78 81 91   Resp: 22 30 24 20    Temp: 36.3 ??C (97.3 ??F) 36.8 ??C (98.2 ??F) 36.7 ??C (98.1 ??F) 37.3 ??C (99.1 ??F)   TempSrc: Oral Oral Oral Oral   SpO2: 100% 100% 100% 100%   Weight:       Height:           Growth:   Wt Readings from Last 3 Encounters:   09/20/19 53.8 kg (118 lb 9.7 oz) (3 %, Z= -1.87)*   09/13/19 53.8 kg (118 lb 9.6 oz) (3 %, Z= -1.86)*   09/07/19 56.1 kg (123 lb 10.9 oz) (6 %, Z= -1.53)*     * Growth percentiles are based on CDC (Boys, 2-20 Years) data.     175 cm (5' 8.9)  Body surface area is 1.62 meters squared.  Body mass index is 17.57 kg/m??.    Growth Percentiles:   3 %ile (Z= -1.87) based on CDC (Boys, 2-20 Years) weight-for-age data using vitals from 09/20/2019.  40 %ile (Z= -0.24) based on CDC (Boys, 2-20 Years) Stature-for-age data based on Stature recorded on 09/18/2019.  <1 %ile (Z= -2.48) based on CDC (Boys, 2-20 Years) BMI-for-age data using weight from 09/20/2019 and height from 09/18/2019.    No intake/output data recorded.    General Appearance: Thin appearing young adult, in no acute distress, tremors noted to BUE, cooperative   HEENT: Normocephalic/atraumatic, normal hair distribution, sceral icterus present bilaterally, MMM Neck: Supple, FROM   CV: RRR, clear s1,s2, no MRG, DP and radial pulses 2+ and equal bilaterally   Lungs: CTAB, no wheezes, no increased WOB   Abd: Soft, nontender, slightly distended    MSK: Warm and well perfused, nonpitting edema noted to bilateral lower legs/ankles   Vascular Access: Left forearm PIV in place, saline locked   Skin: Warm, dry, intact; no petechiae   Neurologic: No focal deficits noted        Diagnostic Evaluation:        Hemoglobin and Platelets:  Lab Results   Component Value Date    HGB 8.6 (L) 09/20/2019    HGB 8.8 (L) 09/19/2019    HGB 9.7 (L) 09/18/2019    HGB 9.4 (L) 09/15/2019    HGB 7.9 (L) 09/08/2019    HGB 8.9 (L) 09/05/2019    HGB 9.7 (L) 09/04/2019    PLT 373 09/20/2019    PLT 400 09/19/2019    PLT 467 (H) 09/18/2019    PLT 558 (H) 09/15/2019    PLT 40 (L) 09/08/2019    PLT 16 (L) 09/05/2019    PLT 32 (L) 09/04/2019       Coagulation:  Lab Results   Component Value Date    DDIMER 713 (H) 09/20/2019    DDIMER 1,067 (H) 09/05/2019    DDIMER 2,815 (H) 05/19/2016     No results found for: FACTORVIII  Lab Results   Component Value Date    FIBRINOGEN 216 09/22/2016    FIBRINOGEN 320 09/22/2016    FIBRINOGEN 484 (H) 09/21/2016       Anticoagulation Monitoring:  Lab Results   Component Value Date    PT 17.4 (H) 09/21/2019    PT 18.6 (H) 09/20/2019    PT  16.9 (H) 09/18/2019    PT 18.9 (H) 09/18/2019    PT 16.9 (H) 09/15/2019    PT 18.9 (H) 09/08/2019    PT 17.3 (H) 09/05/2019    INR 1.50 09/21/2019    INR 1.60 09/20/2019    INR 1.46 09/18/2019    INR 1.63 09/18/2019    INR 1.40 (H) 09/15/2019    INR 1.63 09/08/2019    INR 1.49 09/05/2019      Lab Results   Component Value Date    APTT 34.3 09/21/2019    APTT 49.0 (H) 09/20/2019    APTT 30.2 09/18/2019    APTT 25.0 (L) 08/14/2019    APTT 27.2 05/27/2018    APTT 31.5 05/11/2018    APTT 27.9 01/09/2018    HEPCORR 0.2 09/21/2019    HEPCORR 0.3 09/20/2019    HEPCORR 0.2 09/18/2019    HEPCORR <0.2 08/14/2019    HEPCORR <0.2 05/27/2018    HEPCORR 0.2 05/11/2018    HEPCORR <0.2 01/09/2018     No results found for: HEPUNF  No results found for: LMWH    Thrombophilia Testing:  Lab Results   Component Value Date    AT3 66 (L) 09/21/2019    AT3 126 (H) 09/21/2016       Lupus Inhibitor Panel  No results found for: DRVVT, DRVVTCONF, DRVVTPANEL, PTTLA, ANTIPLINTRP    Beta-2 Glycoprotein antibodies:  No results found for: B2GLYCOIGG, B2GLYCOIGM    Cardiolipin Antibodies:   No results found for: CRDLPNIGG, CRDLPNIGM    Bleeding Testing:  Platelet Function:  No results found for: PLTFS    Factor Levels:  Lab Results   Component Value Date    FII 117 09/21/2016         Renal Function:  Lab Results   Component Value Date    CREATININE 1.29 09/21/2019    CREATININE 1.37 (H) 09/20/2019    CREATININE 1.22 09/19/2019       Liver Function:  Lab Results   Component Value Date    AST 147 (H) 09/21/2019    AST 150 (H) 09/20/2019    AST 165 (H) 09/19/2019    ALT  09/21/2019      Comment:      Specimen icteric.    ALT 239 (H) 09/20/2019    ALT  09/19/2019      Comment:      Specimen icteric.         I personally spent 30 minutes in direct patient care time included reviewing the patient's chart and communicating with other professionals and coordinating care.      Larene Pickett, FNP-C  Peds Heme/Onc  09/21/2019    CC:   [name], MD  Self, Referred

## 2019-09-21 NOTE — Unmapped (Signed)
Diabetes Care Team Follow Up Consult Note     Requesting Attending Physician : Morrison Old, MD  Service Requesting Consult : Ped Gastroenterology [facility])  Primary Care Provider: [name], MD    Assessment and Plan:  IMPRESSION:  [name] Gross is a 18 y.o. male admitted for No Principal Problem: There is no principal problem currently on the Problem List. Please update the Problem List and refresh.. We have been consulted at the request of Morrison Old, MD to evaluate Garald for hyperglycemia.     RECOMMENDATIONS:  1. Medication induced hyperglycemia, uncontrolled: on prednisone 40mg . Patient had 2 prandial BG above goal yesterday, likely due to snacking without insulin coverage. Patient NPO this AM for endoscopy. Switched to regular insulin 1:50>150 overnight. Will stop regular insulin and restart previous regimen when he returns from procedure.  - lantus 5 units nightly  - lispro 2-4 units TID AC when PO (2u with small meal/snack, 4u with large meal)  - lispro 1:50>150    Other problems complicating glycemic control:  Liver transplant  Active Problems:    Liver transplant rejection (CMS-HCC)    AKI (acute kidney injury) (CMS-HCC)    Fever     Thank you for this consult.  We will continue to follow and make recommendations and place orders as appropriate.    Please page with questions or concerns: [place], [place]: [phone] from 6AM - 3PM on weekdays or endocrine fellow on call: [phone] from 3PM - 6AM on weekdays or on weekends and holidays. If APP cannot be reached, please page the endocrine fellow on call.    Subjective:  Initial encounter HPI:  LOC [name] Gross is a 18 y.o. 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 Endocrinology consulted for steroid-induced hyperglycemia.    Diabetes History:  Patient has a history of Medication induced hyperglycemia.  Current home diabetes regimen: lantus 10u, novalog 3-6u with meals, SSI  Hypoglycemia awareness: yes.  Complications related to diabetes: none known    Interval History: Patient sleeping this morning. Grandmother at bedside. Patient currently NPO for endoscopy today. 2 BG above goal yesterday, likely due to eating PB&J last night without insulin coverage.    Current Diabetes Inpatient Regimen:  - lantus 5u nightly  - lispro 2-4u TID AC (2u for small meal, 4u for large meal)  - lispro 2:50>150    Current Nutrition:  Active Orders   Diet    NPO No Exceptions; Operating room       ROS: Not obtained because patient was sleeping    ??? benzoyl peroxide   Topical Nightly   ??? calcium carbonate  300 mg elem calcium Oral BID   ??? cefTRIAXone  1 g Intravenous Q24H   ??? cholecalciferol (vitamin D3)  5,000 Units Oral Daily   ??? clindamycin   Topical BID   ??? insulin regular  0-5 Units Subcutaneous Q4H   ??? ketoconazole   Topical Daily   ??? magnesium oxide  400 mg Oral Daily   ??? melatonin  3 mg Oral QPM   ??? metroNIDAZOLE  500 mg Oral TID   ??? multivitamin, with zinc  2 tablet Oral Daily   ??? mupirocin   Topical TID   ??? mycophenolate  540 mg Oral BID   ??? nystatin  1,000,000 Units Oral TID   ??? pantoprazole  40 mg Oral Daily   ???  potassium & sodium phosphates 250mg   2 packet Oral TID   ??? predniSONE  40 mg Oral Daily   ??? rifAXIMin  550 mg Oral BID   ??? sulfamethoxazole-trimethoprim  1 tablet Oral Q MWF   ??? valGANciclovir  450 mg Oral Daily       Past Medical History:   Diagnosis Date   ??? 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 Depression    ??? MSSA bacteremia 08/27/2019   ??? Seasonal allergies    ??? Sickle cell trait (CMS-HCC)    ??? Strep Mitis Central line-associated bloodstream infection 08/27/2019       Family History Problem Relation Age of Onset   ??? Diabetes Maternal Grandmother    ??? Diabetes Paternal Grandmother    ??? Hypertension Maternal Grandfather    ??? Hypertension Paternal Grandfather    ??? Asthma Brother    ??? Anesthesia problems Neg Hx    ??? Melanoma Neg Hx    ??? Basal cell carcinoma Neg Hx    ??? Squamous cell carcinoma Neg Hx        Social History     Tobacco Use   ??? Smoking status: Never Smoker   ??? Smokeless tobacco: Never Used   Substance Use Topics   ??? Alcohol use: Never     Alcohol/week: 0.0 standard drinks     Frequency: Never     Comment: 1/2 glass of wine on rare occasions   ??? Drug use: Never       OBJECTIVE: Patient was seen face to face. Full exam not completed due to COVID-19/limiting PPE and exposure.  BP 109/68  - Pulse 72  - Temp 36.7 ??C (98.1 ??F) (Temporal)  - Resp 24  - Ht 172.7 cm (5' 8)  - Wt 52.6 kg (116 lb)  - SpO2 100%  - BMI 17.64 kg/m??   Wt Readings from Last 12 Encounters:   09/21/19 52.6 kg (116 lb) (2 %, Z= -2.05)*   09/13/19 53.8 kg (118 lb 9.6 oz) (3 %, Z= -1.86)*   09/07/19 56.1 kg (123 lb 10.9 oz) (6 %, Z= -1.53)*   01/21/19 54.1 kg (119 lb 4.3 oz) (4 %, Z= -1.71)*   10/13/18 58.5 kg (129 lb) (15 %, Z= -1.05)*   08/11/18 62.2 kg (137 lb 3.2 oz) (28 %, Z= -0.58)*   07/21/18 61.8 kg (136 lb 3.2 oz) (27 %, Z= -0.62)*   07/16/18 61.8 kg (136 lb 3.2 oz) (27 %, Z= -0.61)*   07/05/18 61.7 kg (136 lb 0.4 oz) (27 %, Z= -0.61)*   06/25/18 61 kg (134 lb 7.7 oz) (25 %, Z= -0.69)*   05/18/18 55.1 kg (121 lb 6.4 oz) (8 %, Z= -1.41)*   05/15/18 56.1 kg (123 lb 10.9 oz) (10 %, Z= -1.27)*     * Growth percentiles are based on CDC (Boys, 2-20 Years) data.     General: NAD, lying in bed, sleeping  Lungs: normal wob  Psych: calm    Data Review    BG/insulin reviewed per EMR.   Glucose, POC (mg/dL)   Date Value   16/08/9603 133   09/21/2019 138   09/20/2019 193 (H)   09/20/2019 227 (H)   09/20/2019 179   09/20/2019 115   09/19/2019 166   09/19/2019 179  Summary of labs:  Lab Results   Component Value Date A1C 5.0 08/17/2019    A1C 4.9 10/13/2018    A1C 7.6 (H) 07/16/2018     Lab Results   Component Value Date    CREATININE 1.29 09/21/2019     Lab Results   Component Value Date    WBC 6.4 09/20/2019    HGB 8.6 (L) 09/20/2019    HCT 26.5 (L) 09/20/2019    PLT 373 09/20/2019       Lab Results   Component Value Date    NA 138 09/21/2019    K 4.2 09/21/2019    CL 110 (H) 09/21/2019    CO2  09/21/2019      Comment:      Specimen icteric.    BUN 24 (H) 09/21/2019    CREATININE 1.29 09/21/2019    GLU 136 09/21/2019    CALCIUM 8.3 (L) 09/21/2019    MG 1.4 (L) 09/20/2019    PHOS 3.7 09/20/2019       Lab Results   Component Value Date    BILITOT >27.0 (H) 09/21/2019    BILIDIR 23.30 (H) 09/21/2019    PROT  09/21/2019      Comment:      Specimen icteric.    ALBUMIN 2.8 (L) 09/21/2019    ALT  09/21/2019      Comment:      Specimen icteric.    AST 147 (H) 09/21/2019    ALKPHOS 784 (H) 09/21/2019    GGT 388 (H) 09/21/2019       Lab Results   Component Value Date    INR 1.50 09/21/2019    APTT 34.3 09/21/2019

## 2019-09-22 DIAGNOSIS — Z01818 Encounter for other preprocedural examination: Principal | ICD-10-CM

## 2019-09-22 DIAGNOSIS — T8691 Unspecified transplanted organ and tissue rejection: Principal | ICD-10-CM

## 2019-09-22 LAB — COMPREHENSIVE METABOLIC PANEL
ALBUMIN: 2.6 g/dL — ABNORMAL LOW (ref 3.5–5.0)
ALKALINE PHOSPHATASE: 863 U/L — ABNORMAL HIGH (ref 65–260)
AST (SGOT): 138 U/L — ABNORMAL HIGH (ref 19–55)
BILIRUBIN TOTAL: 26.2 mg/dL — ABNORMAL HIGH (ref 0.0–1.2)
BUN / CREAT RATIO: 20
CALCIUM: 8.6 mg/dL (ref 8.5–10.2)
CREATININE: 1.25 mg/dL (ref 0.70–1.30)
EGFR CKD-EPI AA MALE: >90 mL/min/{1.73_m2} (ref >=60)
EGFR CKD-EPI NON-AA MALE: 83 mL/min/{1.73_m2} (ref >=60)
GLUCOSE RANDOM: 122 mg/dL (ref 70–179)
POTASSIUM: 4 mmol/L (ref 3.5–5.0)
SODIUM: 136 mmol/L (ref 135–145)

## 2019-09-22 LAB — HEPARIN CORRELATION: Lab: 0.2

## 2019-09-22 LAB — MAGNESIUM: Magnesium:MCnc:Pt:Ser/Plas:Qn:: 1.6

## 2019-09-22 LAB — GAMMA GLUTAMYL TRANSFERASE: Gamma glutamyl transferase:CCnc:Pt:Ser/Plas:Qn:: 434 — ABNORMAL HIGH

## 2019-09-22 LAB — CBC W/ AUTO DIFF
BASOPHILS ABSOLUTE COUNT: 0 10*9/L (ref 0.0–0.1)
BASOPHILS RELATIVE PERCENT: 0.4 %
EOSINOPHILS ABSOLUTE COUNT: 0 10*9/L (ref 0.0–0.4)
EOSINOPHILS RELATIVE PERCENT: 0.1 %
HEMATOCRIT: 28.6 % — ABNORMAL LOW (ref 41.0–53.0)
HEMOGLOBIN: 9.3 g/dL — ABNORMAL LOW (ref 13.5–17.5)
LARGE UNSTAINED CELLS: 1 % (ref 0–4)
LYMPHOCYTES RELATIVE PERCENT: 18.3 %
MEAN CORPUSCULAR HEMOGLOBIN CONC: 32.4 g/dL (ref 31.0–37.0)
MEAN CORPUSCULAR HEMOGLOBIN: 29.9 pg (ref 26.0–34.0)
MEAN CORPUSCULAR VOLUME: 92.3 fL (ref 80.0–100.0)
MONOCYTES ABSOLUTE COUNT: 0.6 10*9/L (ref 0.2–0.8)
MONOCYTES RELATIVE PERCENT: 7.8 %
NEUTROPHILS ABSOLUTE COUNT: 5.4 10*9/L (ref 2.0–7.5)
NEUTROPHILS RELATIVE PERCENT: 72.3 %
PLATELET COUNT: 308 10*9/L (ref 150–440)
RED BLOOD CELL COUNT: 3.1 10*12/L — ABNORMAL LOW (ref 4.50–5.90)
RED CELL DISTRIBUTION WIDTH: 24.7 % — ABNORMAL HIGH (ref 12.0–15.0)
WBC ADJUSTED: 7.4 10*9/L (ref 4.5–11.0)

## 2019-09-22 LAB — BILIRUBIN DIRECT: Bilirubin.glucuronidated+Bilirubin.albumin bound:MCnc:Pt:Ser/Plas:Qn:: 23.7 — ABNORMAL HIGH

## 2019-09-22 LAB — PROTEIN C ACTIVITY: Lab: 34 — ABNORMAL LOW

## 2019-09-22 LAB — EGFR CKD-EPI NON-AA MALE: Lab: 83

## 2019-09-22 LAB — PROTEIN S ACTIVITY
Lab: 30 — ABNORMAL LOW
PROTEIN S ACTIVITY: 30 %{normal} — ABNORMAL LOW (ref 64–147)

## 2019-09-22 LAB — PHOSPHORUS: Phosphate:MCnc:Pt:Ser/Plas:Qn:: 3.6

## 2019-09-22 LAB — MONOCYTES RELATIVE PERCENT: Monocytes/100 leukocytes:NFr:Pt:Bld:Qn:Automated count: 7.8

## 2019-09-22 LAB — PROTIME-INR
INR: 1.5
PROTIME: 17.5 s — ABNORMAL HIGH (ref 10.5–13.5)

## 2019-09-22 LAB — AMMONIA: Ammonia:SCnc:Pt:Plas:Qn:: 16

## 2019-09-22 LAB — PROTIME: Coagulation tissue factor induced:Time:Pt:PPP:Qn:Coag: 17.5 — ABNORMAL HIGH

## 2019-09-22 NOTE — Unmapped (Signed)
Diabetes Care Team Follow Up Consult Note     Requesting Attending Physician : Morrison Old, MD  Service Requesting Consult : Ped Gastroenterology (PMG)  Primary Care Provider: [name], MD    Assessment and Plan:  IMPRESSION:  [name] Gross is a 18 y.o. 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 We have been consulted at the request of Morrison Old, MD to evaluate [name] Gross for hyperglycemia.     RECOMMENDATIONS:  1. Medication induced hyperglycemia, uncontrolled: on prednisone 40mg . Patient likes to snack throughout the day/evening so have made nutritional insulin available achs. BG mostly within range yesterday. Fasting BG 112 this AM. Will continue with current regimen.   - lantus 5 units nightly  - lispro 2-4 units achs (2u with small meal/snack, 4u with large meal)  - lispro 1:50>150    Discharge recs:   - Check blood sugar achs  - lantus 5 units nightly   - If morning BG are elevated (>120) at home he may increase lantus 1 unit per day until seeing fasting glucose <120, with a max dose of lantus 10 units nightly.  - lispro 2-4 units with meals/snacks. Administer 2 units for small meal/snack and 4 units for full meal.   - lispro sensitive correction, 1:50>150 achs  - follow up with PCP in 1-3 weeks    Other problems complicating glycemic control:  Liver transplant  Active Problems:    Liver transplant rejection (CMS-HCC)    AKI (acute kidney injury) (CMS-HCC)    Fever     Thank you for this consult.  We will continue to follow and make recommendations and place orders as appropriate.    Please page with questions or concerns: [place], [place]: [phone] from 6AM - 3PM on weekdays or endocrine fellow on call: [phone] from 3PM - 6AM on weekdays or on weekends and holidays. If APP cannot be reached, please page the endocrine fellow on call.    Subjective:  Initial encounter HPI:  [name] Gross is a 18 y.o. 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 Endocrinology consulted for steroid-induced hyperglycemia.    Diabetes History:  Patient has a history of Medication induced hyperglycemia.  Current home diabetes regimen: lantus 10u, novalog 3-6u with meals, SSI  Hypoglycemia awareness: yes.    Interval History: Patient says he is feeling okay this morning. He denies nausea and vomiting. He is frustrated by the sodium restricted diet and states he sometimes feels hungry even after eating. He had endoscopy yesterday. Planning for discharge in 2 days.     Current Diabetes Inpatient Regimen:  - lantus 5u nightly  - lispro 2-4u achs (2u for small meal, 4u for large meal)  - lispro 1:50>150    Current Nutrition:  Active Orders   Diet    Nutrition Therapy Sodium Restricted; Sodium Restricted (2 gm Na+)       ROS: As per HPI    ??? benzoyl peroxide   Topical Daily   ??? calcium carbonate  300 mg elem calcium Oral BID   ??? cefTRIAXone  1 g Intravenous Q24H   ??? cholecalciferol (vitamin D3)  5,000 Units Oral Daily   ??? clindamycin   Topical BID   ???  insulin glargine  5 Units Subcutaneous Nightly   ??? insulin lispro  0-5 Units Subcutaneous ACHS   ??? insulin lispro  2-4 Units Subcutaneous ACHS   ??? ketoconazole   Topical Daily   ??? magnesium oxide  400 mg Oral Daily   ??? melatonin  3 mg Oral QPM   ??? metroNIDAZOLE  500 mg Oral TID   ??? multivitamin, with zinc  2 tablet Oral Daily   ??? mupirocin   Topical TID   ??? mycophenolate  540 mg Oral BID   ??? nystatin  1,000,000 Units Oral TID   ??? pantoprazole  40 mg Oral Daily   ??? potassium & sodium phosphates 250mg   2 packet Oral TID   ??? predniSONE  40 mg Oral Daily   ??? rifAXIMin  550 mg Oral BID   ??? sulfamethoxazole-trimethoprim  1 tablet Oral Q MWF   ??? tacrolimus  3 mg Oral BID   ??? valGANciclovir  450 mg Oral Daily       Past Medical History:   Diagnosis Date   ??? 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 Depression    ??? MSSA bacteremia 08/27/2019   ??? Seasonal allergies    ??? Sickle cell trait (CMS-HCC)    ??? Strep Mitis Central line-associated bloodstream infection 08/27/2019       Family History   Problem Relation Age of Onset   ??? Diabetes Maternal Grandmother    ??? Diabetes Paternal Grandmother    ??? Hypertension Maternal Grandfather    ??? Hypertension Paternal Grandfather    ??? Asthma Brother    ??? Anesthesia problems Neg Hx    ??? Melanoma Neg Hx    ??? Basal cell carcinoma Neg Hx    ??? Squamous cell carcinoma Neg Hx        Social History     Tobacco Use   ??? Smoking status: Never Smoker   ??? Smokeless tobacco: Never Used   Substance Use Topics   ??? Alcohol use: Never     Alcohol/week: 0.0 standard drinks     Frequency: Never     Comment: 1/2 glass of wine on rare occasions   ??? Drug use: Never       OBJECTIVE: Patient was seen face to face. Full exam not completed due to COVID-19/limiting PPE and exposure.  BP 126/50  - Pulse 72  - Temp 36.9 ??C (98.4 ??F) (Axillary)  - Resp 19  - Ht 172.7 cm (5' 8)  - Wt 52.6 kg (116 lb)  - SpO2 100%  - BMI 17.64 kg/m??   Wt Readings from Last 12 Encounters:   09/21/19 52.6 kg (116 lb) (2 %, Z= -2.05)*   09/13/19 53.8 kg (118 lb 9.6 oz) (3 %, Z= -1.86)*   09/07/19 56.1 kg (123 lb 10.9 oz) (6 %, Z= -1.53)*   01/21/19 54.1 kg (119 lb 4.3 oz) (4 %, Z= -1.71)*   10/13/18 58.5 kg (129 lb) (15 %, Z= -1.05)*   08/11/18 62.2 kg (137 lb 3.2 oz) (28 %, Z= -0.58)*   07/21/18 61.8 kg (136 lb 3.2 oz) (27 %, Z= -0.62)*   07/16/18 61.8 kg (136 lb 3.2 oz) (27 %, Z= -0.61)*   07/05/18 61.7 kg (136 lb 0.4 oz) (27 %, Z= -0.61)*   06/25/18 61 kg (134 lb 7.7 oz) (25 %,  Z= -0.69)*   05/18/18 55.1 kg (121 lb 6.4 oz) (8 %, Z= -1.41)*   05/15/18 56.1 kg (123 lb 10.9 oz) (10 %, Z= -1.27)*     * Growth percentiles are based on CDC (Boys, 2-20 Years) data.     General: NAD, sitting up in bed  Eyes: slcera icteric   Lungs: normal wob  Psych: calm, engaged  Neuro: awake and alert    Data Review    BG/insulin reviewed per EMR.   Glucose, POC (mg/dL)   Date Value   16/08/9603 142   09/21/2019 220 (H)   09/21/2019 148   09/21/2019 133   09/21/2019 138   09/20/2019 193 (H)   09/20/2019 227 (H)   09/20/2019 179        Summary of labs:  Lab Results   Component Value Date    A1C 5.0 08/17/2019    A1C 4.9 10/13/2018    A1C 7.6 (H) 07/16/2018     Lab Results   Component Value Date    CREATININE 1.29 09/21/2019     Lab Results   Component Value Date    WBC 6.4 09/20/2019    HGB 8.6 (L) 09/20/2019    HCT 26.5 (L) 09/20/2019    PLT 373 09/20/2019       Lab Results   Component Value Date    NA 138 09/21/2019    K 4.2 09/21/2019    CL 110 (H) 09/21/2019    CO2  09/21/2019      Comment:      Specimen icteric.    BUN 24 (H) 09/21/2019    CREATININE 1.29 09/21/2019    GLU 136 09/21/2019    CALCIUM 8.3 (L) 09/21/2019    MG 1.5 (L) 09/21/2019    PHOS 4.3 09/21/2019       Lab Results   Component Value Date    BILITOT >27.0 (H) 09/21/2019    BILIDIR 23.30 (H) 09/21/2019    PROT  09/21/2019      Comment:      Specimen icteric.    ALBUMIN 2.8 (L) 09/21/2019    ALT  09/21/2019      Comment:      Specimen icteric.    AST 147 (H) 09/21/2019    ALKPHOS 784 (H) 09/21/2019    GGT 388 (H) 09/21/2019       Lab Results   Component Value Date    INR 1.50 09/22/2019    APTT 31.6 09/22/2019

## 2019-09-22 NOTE — Unmapped (Signed)
Pt afebrile and VSS throughout shift. On CRM. Pt NPO for EGD; currently Na restriction diet. BG checks completed as ordered and insulin given per parameters. PIV currently saline locked. Awaiting stool sample for stool cx/cdiff panel; enteric precautions. GMOC at bedside and active in care. WCTM and intervene as needed.     Problem: Adult Inpatient Plan of Care  Goal: Plan of Care Review  Outcome: Progressing  Goal: Patient-Specific Goal (Individualization)  Outcome: Progressing  Goal: Absence of Hospital-Acquired Illness or Injury  Outcome: Progressing  Goal: Optimal Comfort and Wellbeing  Outcome: Progressing  Goal: Readiness for Transition of Care  Outcome: Progressing  Goal: Rounds/Family Conference  Outcome: Progressing     Problem: Self-Care Deficit  Goal: Improved Ability to Complete Activities of Daily Living  Outcome: Progressing     Problem: Fall Injury Risk  Goal: Absence of Fall and Fall-Related Injury  Outcome: Progressing     Problem: Wound  Goal: Optimal Wound Healing  Outcome: Progressing     Problem: Adjustment to Illness (Liver Failure)  Goal: Optimal Coping with Liver Failure  Outcome: Progressing     Problem: Neurologic Function Impaired (Liver Failure)  Goal: Optimal Neurologic Function  Outcome: Progressing     Problem: Oral Intake Inadequate (Liver Failure)  Goal: Optimal Nutrition Intake  Outcome: Progressing     Problem: Infection  Goal: Infection Symptom Resolution  Outcome: Progressing

## 2019-09-22 NOTE — Unmapped (Signed)
Pediatric Daily Progress Note     Assessment/Plan:     Active Problems:    Liver transplant rejection (CMS-HCC)    AKI (acute kidney injury) (CMS-HCC)    Fever  Resolved Problems:    * No resolved hospital problems. *    [name] Gross is a 18 y.o. 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 Patient was transfered to acute care floor on 11/9. Now found to have a right upper extremity DVT on PVL. Unclear if clot is old (could be 2/2 to PICC line) so as [name] Gross is asymptomatic and in consultation with Heme/onc will stop anticoagulation and reassess on Friday with repeat dopplers.  He requires continued admission for continued empiric treatment for SBP, management of hepatic encephalopathy, fluid management, further infectious workup of fever and medication monitoring.     Fever in immunocompromised patient: febrile at home on 11/7, no fever inpatient. Blood culture NG x72h, Ucx neg, RPP/COVID neg, paracentesis cytology not consistent with SBP, echo without endocarditis, and LP deferred given clinical improvement. Unclear source at present, requires continued empiric antibiotic treatment until infectious source ruled out. Chest X-ray 11/10: Ill-defined opacities in the right lung base, likely atelectasis versus airspace diseasePatient has new diarrhea, GIPP and Cdiff collected 11/11. +Fungitel, however ID believes this is a result of his oral candidiasis. CMV IgG +  -Adult transplant ID following, appreciate recs  - +Fungitel, however ID believes this is a result of his oral candidiasis   -If oral candidiasis reappears, will get fungal culture   -Continue nystatin  -Ascitic fluid cultures: LDH 230, Albumin <1, Protein <2, negative fungal cx, <300 PMNs (no SBP), culture NGTD  -HIV- , HSV -, gonorrhea/chlamydia NAAT -,  EBV -, CMV IgG +  -Can consider MRCP if abdominal pain not improving/worsening  -Ceftriaxone/flagyl??  -Bactrim PPx MWF  -Valgancyclovir PPx daily  ??  Right upper extremity DVT: Evidence of acute obstruction is seen on right upper extremity PVL.  - Per Heme/Onc:    -Hold off on lovenox given G1 esophageal varices and asymptomatic   -Consider therapeutic anticoagulation if becomes symptomatic (swelling in arms) or clot burden increases on repeat PVL on 11/13   - Goal Xa level 0.5-1  - Follow up with Heme/Onc after discharge    Acute on chronic liver transplant rejection: presumed PSC s/p transplant in 2017  -Adult hepatology following, appreciate   -EGD showed G1 non-bleeding esophageal varices   -Restarted tacrolimus (Dr. Melrose Nakayama managing)  -Continue myfortic 540mg  BID  -Prednisone 40mg  daily  -Protonix 40mg  daily  -Rifaximin 550mg  BID  -Lactulose 20 g BID PRN (hold for diarrhea)  -MVI, Mag, calcium, Vit D3, NaPhos supplements  -Zofran q8 PRN for nausea  -Regular diet  -daily CMP, GGT, DBili, PT/INR, APTT    AKI: Cr improved to 1.29 (1.37)  -Set a PO goal of 1600 ml  -Regular diet (low sodium)  -No mIVF  ??  Pleural effusion: trace effusion on chest X-ray   -Continue pulse ox  -CXR if signs of respiratory distress or hypoexmia  -Fluid management as above  ??  Steroid induced hyperglycemia:   -Adult endocrinology following, appreciate reccs  -Will restart??Lantus??5??units at bedtime (typically on 10u lantus nightly at home)  -Nutritional insulin when eating:??Lispro??2-4??units with meals (Hold if NPO)  -Continue Lispro correction 1:50>150 ACHS  -  PRN glucose tablet, glucagon for hypoglycemia??  ??  Derm: Oral thrush, steroid induced acne??and atypical folliculitis.  -Nystatin swish TID  -Topical clindamycin, benzoyl peroxide, ketoconazole shampoo  ??  Complex social situation:   Hx of significant social stressors, patient cut ties with all family members. Appears to be mending the relationship with his parents. Lives primarily with paternal grandmother. [name] Gross is HCPOA per ACP documentation.    Access: PIV    Discharge Criteria:     Plan of care discussed with caregiver(s) at bedside.      Subjective:     Interval History: No acute events overnight. Had EGD done today which showed G1 esophageal varices. Patient having inaccurate I/Os because having diarrhea every other time he goes to bathroom.     Objective:     Vital signs in last 24 hours:  Temp:  [36.3 ??C-37.3 ??C] 36.4 ??C  Heart Rate:  [65-91] 79  Resp:  [14-30] 16  BP: (90-124)/(50-68) 112/64  MAP (mmHg):  [63-79] 76  SpO2:  [97 %-100 %] 100 %  BMI (Calculated):  [17.64] 17.64  Intake/Output last 3 shifts:  I/O last 3 completed shifts:  In: 1430 [P.O.:1380; IV Piggyback:50]  Out: 1825 [Urine:1825]    Physical Exam:  Gen: Resting comfortably in bed in no acute distress  Eyes: Sclera icteric, conjunctivae non-injected.  ENT: External ear normal, mucous membranes moist  Heart: Regular rate and rhythm without murmurs, rubs, or gallops.  S1 and S2 normal. 2+ radial pulses   Pulmonary: Good air movement. Diminish at the bases bilaterally. Normal work of breathing  Skin: No rashes  Abd: Normal bowel sounds, soft, RUQ tenderness, and mild abd distention.   Ext: Warm and well perfused without cyanosis, clubbing, or edema.  Neuro: No focal findings        Active Medications reviewed and KEY Medications include:    Current Facility-Administered Medications:   ???  benzoyl peroxide 5 % gel, , Topical, Nightly, Randall Hiss, MD  ???  calcium carbonate (OS-CAL) tablet 300 mg elem calcium, 300 mg elem calcium, Oral, BID, Randall Hiss, MD, 300 mg elem calcium at 09/21/19 1309  ???  cefTRIAXone dilution (ROCEPHIN) 40 mg/mL injection 1 g, 1 g, Intravenous, Q24H, Oona Trammel Loma Messing, MD, Last Rate: 50 mL/hr at 09/21/19 1816, 1 g at 09/21/19 1816  ???  cholecalciferol (vitamin D3) tablet 5,000 Units, 5,000 Units, Oral, Daily, Randall Hiss, MD, 5,000 Units at 09/21/19 1310  ???  clindamycin (CLEOCIN T) 1 % lotion, , Topical, BID, Randall Hiss, MD  ???  dextrose 50 % in water (D50W) 50 % solution 12.5 g, 12.5 g, Intravenous, Q10 Min PRN, Randall Hiss, MD  ???  glucagon injection 1 mg, 1 mg, Intravenous, Once PRN, Alfredia Ferguson, MD  ???  glucose chewable tablet 4 g, 4 g, Oral, 4x Daily PRN, Randall Hiss, MD  ???  insulin glargine (LANTUS) injection 5 Units, 5 Units, Subcutaneous, Nightly, Lewie Loron, MD  ???  insulin lispro (HumaLOG) injection 0-5 Units, 0-5 Units, Subcutaneous, ACHS, Rolanda Lundborg, [place], 2 Units at 09/21/19 1524  ???  insulin lispro (HumaLOG) injection 2-4 Units, 2-4 Units, Subcutaneous, ACHS, Rolanda Lundborg, [place], 2 Units at 09/21/19 1525  ???  ketoconazole (NIZORAL) 2 % shampoo, , Topical, Daily, Randall Hiss, MD  ???  lactated Ringers infusion, 10 mL/hr, Intravenous, Continuous, Pia Mau, MD, Last Rate: 10 mL/hr at 09/21/19 1012, 10 mL/hr at 09/21/19 1012  ???  lactulose (CHRONULAC) oral solution, 20 g, Oral, BID PRN, Lewie Loron, MD  ???  magnesium oxide (MAG-OX) tablet 400 mg, 400 mg, Oral, Daily, Randall Hiss, MD, 400 mg at 09/21/19 1311  ???  melatonin tablet 3 mg, 3 mg, Oral, QPM, Randall Hiss, MD, 3 mg at 09/20/19 2117  ???  metroNIDAZOLE (FLAGYL) tablet 500 mg, 500 mg, Oral, TID, Morrison Old, MD, 500 mg at 09/21/19 1309  ???  multivitamin, with zinc (AQUADEKS) chewable tablet, 2 tablet, Oral, Daily, Randall Hiss, MD, 2 tablet at 09/21/19 1321  ???  mupirocin (BACTROBAN) 2 % ointment, , Topical, TID, Randall Hiss, MD  ???  mycophenolate (MYFORTIC) EC tablet 540 mg, 540 mg, Oral, BID, Randall Hiss, MD, 540 mg at 09/21/19 0909  ???  nystatin (MYCOSTATIN) oral suspension, 1,000,000 Units, Oral, TID, Randall Hiss, MD, 1,000,000 Units at 09/21/19 1306  ???  ondansetron (ZOFRAN-ODT) disintegrating tablet 4 mg, 4 mg, Oral, Q8H PRN, Randall Hiss, MD  ???  pantoprazole (PROTONIX) EC tablet 40 mg, 40 mg, Oral, Daily, Randall Hiss, MD, 40 mg at 09/21/19 1311  ???  potassium & sodium phosphates 250mg  (PHOS-NAK/NEUTRA PHOS) packet 2 packet, 2 packet, Oral, TID, Randall Hiss, MD, 2 packet at 09/21/19 1306  ???  predniSONE (DELTASONE) tablet 40 mg, 40 mg, Oral, Daily, Randall Hiss, MD, 40 mg at 09/21/19 0908  ???  rifAXIMin (XIFAXAN) tablet 550 mg, 550 mg, Oral, BID, Randall Hiss, MD, 550 mg at 09/21/19 0909  ???  sulfamethoxazole-trimethoprim (BACTRIM) 400-80 mg tablet 80 mg of trimethoprim, 1 tablet, Oral, Q MWF, Randall Hiss, MD, 80 mg of trimethoprim at 09/21/19 1310  ???  tacrolimus (PROGRAF) capsule 3 mg, 3 mg, Oral, BID, Lewie Loron, MD  ???  valGANciclovir (VALCYTE) tablet 450 mg, 450 mg, Oral, Daily, Randall Hiss, MD, 450 mg at 09/21/19 0908        Studies: Personally reviewed and interpreted.  Labs/Studies:  Labs and Studies from the last 24hrs per EMR and Reviewed and   All lab results last 24 hours:    Recent Results (from the past 24 hour(s))   POCT Glucose    Collection Time: 09/20/19  9:30 PM   Result Value Ref Range    Glucose, POC 227 (H) 70 - 179 mg/dL   POCT Glucose    Collection Time: 09/20/19 11:59 PM   Result Value Ref Range    Glucose, POC 193 (H) 70 - 179 mg/dL   Antithrombin III    Collection Time: 09/21/19  3:55 AM   Result Value Ref Range    AT3 Activity 66 (L) 83 - 123 %   PT-INR    Collection Time: 09/21/19  3:55 AM   Result Value Ref Range    PT 17.4 (H) 10.2 - 13.1 sec    INR 1.50    APTT    Collection Time: 09/21/19  3:55 AM   Result Value Ref Range    APTT 34.3 25.3 - 37.1 sec    Heparin Correlation 0.2    CMV IgG    Collection Time: 09/21/19  3:55 AM   Result Value Ref Range    CMV IGG Positive (A) Negative   HSV Antibody, IgG    Collection Time: 09/21/19  3:55 AM   Result Value Ref Range    HSV 1 IgG Negative Negative    HSV 2 IgG Negative Negative   Tacrolimus Level, Trough  Collection Time: 09/21/19  3:55 AM   Result Value Ref Range    Tacrolimus, Trough 3.5 (L) 5.0 - 15.0 ng/mL   Comprehensive Metabolic Panel    Collection Time: 09/21/19  3:56 AM   Result Value Ref Range    Sodium 138 135 - 145 mmol/L    Potassium 4.2 3.5 - 5.0 mmol/L    Chloride 110 (H) 98 - 107 mmol/L    Anion Gap      CO2      BUN 24 (H) 7 - 21 mg/dL    Creatinine 1.61 0.96 - 1.30 mg/dL    BUN/Creatinine Ratio 19     EGFR CKD-EPI Non-African American, Male 80 >=60 mL/min/1.24m2    EGFR CKD-EPI African American, Male >90 >=60 mL/min/1.101m2    Glucose 136 70 - 179 mg/dL    Calcium 8.3 (L) 8.5 - 10.2 mg/dL    Albumin 2.8 (L) 3.5 - 5.0 g/dL    Total Protein      Total Bilirubin >27.0 (H) 0.0 - 1.2 mg/dL    AST 045 (H) 19 - 55 U/L    ALT      Alkaline Phosphatase 784 (H) 65 - 260 U/L   Gamma GT (GGT)    Collection Time: 09/21/19  3:56 AM   Result Value Ref Range    GGT 388 (H) 12 - 109 U/L   Bilirubin, Direct    Collection Time: 09/21/19  3:56 AM   Result Value Ref Range    Bilirubin, Direct 23.30 (H) 0.00 - 0.40 mg/dL   Magnesium Level    Collection Time: 09/21/19  3:56 AM   Result Value Ref Range    Magnesium 1.5 (L) 1.6 - 2.2 mg/dL   Phosphorus Level    Collection Time: 09/21/19  3:56 AM   Result Value Ref Range    Phosphorus 4.3 2.9 - 4.7 mg/dL   POCT Glucose    Collection Time: 09/21/19  3:57 AM   Result Value Ref Range    Glucose, POC 138 70 - 179 mg/dL   POCT Glucose    Collection Time: 09/21/19  9:13 AM   Result Value Ref Range    Glucose, POC 133 70 - 179 mg/dL   POCT Glucose    Collection Time: 09/21/19 12:58 PM   Result Value Ref Range    Glucose, POC 148 70 - 179 mg/dL   POCT Glucose    Collection Time: 09/21/19  3:09 PM   Result Value Ref Range    Glucose, POC 220 (H) 70 - 179 mg/dL     ========================================  Jonna Clark, MD  Anesthesiology - PGY-1  Pager: [phone]      I performed a history and physical examination of the patient and discussed the patient's management with the family and the resident. I personally reviewed the results of all tests, including blood work and images. I reviewed and edited the resident's note and agree with the documented findings and plan of care. I provided direct supervision to the care of the patient. I spent over 50% of a total 35 min counseling and/or coordinating the care of this patient.     Rocco Pauls, MD  Pediatric Gastroenterology

## 2019-09-22 NOTE — Unmapped (Signed)
GASTROENTEROLOGY TREATMENT PLAN UPDATE    Requesting Attending Physician:  Morrison Old, MD    Reason for Consult:  LAM MCCUBBINS is a 18 y.o. male seen in consultation at the request of Dr. Morrison Old, MD for OLT patient.    Interval History:  No infectious source found btu AMS maybe due to recent start psychiatric medication  No explanation for fever currently     Underwent EGD today with grade I non-bleeding varices.  Unable to clear stomach due to food present.      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 ??Recent admission with MSSA and strep mitis bacteremia. ??No obvious source for fevers and AMS although is improving with holding tacro and treating with antibiotics. He does not have indwelling stents in place currently with last ERCP July 2019. ??Current LFT pattern near baseline with his acute on chronic rejection.    MELD-Na score: 26 at 09/21/2019  3:56 AM  MELD score: 26 at 09/21/2019  3:56 AM  Calculated from:  Serum Creatinine: 1.29 mg/dL at 16/08/9603  5:40 AM  Serum Sodium: 138 mmol/L (Rounded to 137 mmol/L) at 09/21/2019  3:56 AM  Total Bilirubin: 29.7 mg/dL at 98/09/9146  8:29 AM  INR(ratio): 1.50 at 09/21/2019  3:55 AM  Age: 63 years 5 months    He has no fevers since being at home and AMS has improved. No abdominal pain. No infectious source identified as of yet.  Did have a clot of unknown chronicity given previous PICC placement int he right arm although clot burden is extensive.  Could be source of fever.  ??  If clot extends on repeat doppler Friday 11/13 hematology will start anticoagulation. Recommendations:  - if anticoagulation needed, safe to do so  - immunosuppression per primary hepatologist.  Would recommend staying on tacrolimus given improvement with acute rejection biopsy (although chronic rejection still remains) and to help simplify regimen  - he will complete empirical course of antibiotics for possible intraabdominal infection    Please page [phone] for further questions/concerns. Patient discussed with Dr. Sherryll Burger.    Orion Crook. Maren Beach, MD MPH  Gastroenterology Fellow

## 2019-09-22 NOTE — Unmapped (Signed)
Uneventful shift.  Pt struggled with hugner and needing two trays per meal while staying within Na limit.  Possible DC tomorrow.          Problem: Adult Inpatient Plan of Care  Goal: Plan of Care Review  Outcome: Progressing  Goal: Patient-Specific Goal (Individualization)  Outcome: Progressing  Goal: Absence of Hospital-Acquired Illness or Injury  Outcome: Progressing  Goal: Optimal Comfort and Wellbeing  Outcome: Progressing  Goal: Readiness for Transition of Care  Outcome: Progressing  Goal: Rounds/Family Conference  Outcome: Progressing     Problem: Self-Care Deficit  Goal: Improved Ability to Complete Activities of Daily Living  Outcome: Progressing     Problem: Fall Injury Risk  Goal: Absence of Fall and Fall-Related Injury  Outcome: Progressing     Problem: Adjustment to Illness (Liver Failure)  Goal: Optimal Coping with Liver Failure  Outcome: Progressing     Problem: Neurologic Function Impaired (Liver Failure)  Goal: Optimal Neurologic Function  Outcome: Progressing     Problem: Oral Intake Inadequate (Liver Failure)  Goal: Optimal Nutrition Intake  Outcome: Progressing     Problem: Infection  Goal: Infection Symptom Resolution  Outcome: Progressing

## 2019-09-22 NOTE — Unmapped (Signed)
Pediatric Tacrolimus Therapeutic Monitoring Pharmacy Note    [name] Gross is a 18 y.o. male restarting tacrolimus.     Indication: Liver transplant     Date of Transplant: 10/02/2016      Prior Dosing Information: Home regimen 3 mg every 12 hours     Dosing Weight: 52.6 kg    Goals:  Therapeutic Drug Levels  Tacrolimus trough goal: 8-10 ng/mL    Additional Clinical Monitoring/Outcomes  ?? Monitor renal function (SCr and urine output) and liver function (LFTs)  ?? Monitor for signs/symptoms of adverse events (e.g., hyperglycemia, hyperkalemia, hypomagnesemia, hypertension, headache, tremor)    Results:   Tacrolimus level: Not applicable    Pharmacokinetic Considerations and Significant Drug Interactions:  ? Concurrent hepatotoxic medications: None identified  ? Concurrent CYP3A4 substrates/inhibitors: None identified  ? Concurrent nephrotoxic medications: Bactrim prophylaxis    Assessment/Plan:  Recommendedation(s)  Start tacrolimus 3 mg (0.05 mg/kg) PO every 12 hours          Follow-up  ? Next level has been ordered on 09/23/19 at 0800.   ? A pharmacist will continue to monitor and recommend levels as appropriate    Please page service pharmacist with questions/clarifications.    Kelvin Cellar, PharmD

## 2019-09-22 NOTE — Unmapped (Addendum)
Pt was afebrile with VSS this shift. No reports of pain or nausea. BM x1, Cdiff negative, awaiting GI path panel. Pt voiding in toilet so I&Os are not accurate. Pt remained to use urinal for strict I&Os count. Continues on enteric precautions. BG 142 with nightly snack, insulin given according to orders. Remains on Na Restrict diet. Dad at bedside briefly overnight. WCTM.    Problem: Adult Inpatient Plan of Care  Goal: Plan of Care Review  Outcome: Progressing  Goal: Patient-Specific Goal (Individualization)  Outcome: Progressing  Goal: Absence of Hospital-Acquired Illness or Injury  Outcome: Progressing  Goal: Optimal Comfort and Wellbeing  Outcome: Progressing  Goal: Readiness for Transition of Care  Outcome: Progressing  Goal: Rounds/Family Conference  Outcome: Progressing     Problem: Self-Care Deficit  Goal: Improved Ability to Complete Activities of Daily Living  Outcome: Progressing     Problem: Fall Injury Risk  Goal: Absence of Fall and Fall-Related Injury  Outcome: Progressing

## 2019-09-22 NOTE — Unmapped (Signed)
HEPATOLOGY TREATMENT PLAN NOTE     Requesting Attending Physician :  Morrison Old, MD    Reason for Consult:    [name] Gross is a 18 y.o. male seen in consultation at the request of Dr. Morrison Old, MD for OLT.    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 ??Recent admission with MSSA and strep mitis bacteremia. ??No obvious source for fevers and AMS although is improving with holding tacro and treating with antibiotics. He is being followed by ICID.  He does not have indwelling stents in place currently with last ERCP July 2019. ??Current LFT pattern near baseline with his acute on chronic rejection.  R arm clot of unknown chronicity.  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  Per hematology, plan for repeat doppler on 11/13 to determine if [facility] is needed.  ??  MELD-Na score: 26 at 09/22/2019  6:04 AM  MELD score: 26 at 09/22/2019  6:04 AM  Calculated from:  Serum Creatinine: 1.25 mg/dL at 40/98/1191  4:78 AM  Serum Sodium: 136 mmol/L at 09/22/2019  6:04 AM  Total Bilirubin: 26.2 mg/dL at 29/56/2130  8:65 AM  INR(ratio): 1.50 at 09/22/2019  6:04 AM  Age: 18 years 5 months    Recommendations:  - if anticoagulation needed, safe to do so  - immunosuppression per primary hepatologist.  Would recommend staying on tacrolimus given improvement with acute rejection biopsy (although chronic rejection still remains) and to help simplify regimen  - he will complete empirical course of antibiotics for possible intraabdominal infection per ICID recs  - once his acute antibiotic course is completed, he should go back on bactrim for SBP prophylaxis      Thank you for including [place] in the care of your patient. The patient was discussed with Dr. Sherryll Burger.  The GI hepatology team will sign off at this time.  Please page the GI hepatology consult pager at [phone] if you have any further questions.    Jackelyn Hoehn, MD  Gastroenterology and Hepatology Fellow, PGY-6  [employer], [place]

## 2019-09-22 NOTE — Unmapped (Addendum)
Follow-up Pediatric Palliative Care Consult Note:  ??  [name] Gross is a 18 y.o. male seen for pediatric palliative care consultation at the request of Dr. Donzetta Gross for goals of care, advance care planning, and family support.  ??  Primary Care Provider: [name], MD  ??  History provided by: Patient   ??  Assessment:  ??  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 [name] Gross was encouraged to take measures to improve his candidacy for liver transplant, including addressing social barriers and receiving mental health counseling. He is readmitted with fever, fatigue, AKI.  Found to have RUE DVT and is awaitin further imaging to make a plan for this. Palliative care consult requested for goals of care, advance care planning, and family support.     ??  Summary of Discussion and Recommendations:   ??  1. SYMPTOMS:   - [name] Gross denies pain, nausea, poor appetite, insomnia, or changes in mood  - Fever source uncertain which frustrates him but he is relieved to know that it's not due to some things that he was worried about. Expects to continue antibiotics.   - [name] Gross would like assistance with a plan for mental health follow up. He is amenable to continuing to follow with [name] Gross psychologist if possible (telehealth and/or in person) or with someone in his community but would benefit from assistance with making a plan - GI team is working on this.   - Mental status remains tough to assess at times. Short term memory seems affected - unclear if by his chronic liver disease, acute illness, depression, or a combination.   ??  2. GOALS:   - [name] Gross hopes to recover and return to his new home with his paternal grandmother - he is adjusting to a new living environment and has been allowing family members, including his mother, back into his life to support him and assist with his care  - [name] Gross has long term goals which include going back to school and becoming a Runner, broadcasting/film/video or nurse  - Unclear whether redo transplant is a goal for him - he remains somewhat ambivalent but has expressed interest in working toward this/keeping it an option. He will clearly need continued support, clear expectations, and the opportunity to discuss/address any challenges that arise with the transplant process  ??  3. DECISIONS:   - Information/communication preferences: [name] Gross likes to receive information directly. As above, some concerns about him being able to retain some information which may be situational when in the hospital but could also be related to his underlying condition and mood. He will benefit from continued support from his family and surrogate decision makers.  - Transplant: [name] Gross is aware that redo transplant may be an option and has addressed some of the psychosocial barriers to this already, but he understandably expresses a bit of ambivalence right now.   ??  4. ADVANCE CARE PLANNING  - [name] Gross has designated his friend [name] Gross and grandfather [name] Gross as surrogate decision makers.  - Code status: Full code. Not discussed today. [name] Gross is considering his wishes around life-sustaining treatments using an ACP guide and is likely to need additional information about scenarios we might anticipate if his health continues to decline.   - Prognosis: Guarded given severity of liver disease and potential for lack of response  to salvage therapies, AKI, and complex social situation with lack of caregiver support.  ??  5. SUPPORT:   - [name] Gross described limited personal supports during previous admission but has expanded his support network by reconnecting with many family members who have expressed their desire to support him and assist with his care. Currently living with [name]. Mother is involved again and helping with transportation and appointments.   - [name] Gross would benefit from community palliative care as he navigates decisions about his health care and to assist with symptom management. Contacted [name] Gross to discuss options and as he is <19 years old he with a life limiting condition, he meets criteria for their [facility]. They are not offering palliative care currently but can offer hospice under concurrent care (ie hospice services + continued care aimed at life extension, including transplant if this remains a goal). Discussed with [name] Gross who asked me to contact grandmother - unable to reach by phone at this time and will try again.   ??  6. CARE COORDINATION:   - Care coordinated with GI attending Dr. Cheri Gross, [facility] MD        Total time spent with patient for evaluation & management (excluding ACP documented separately): 35 minutes.  Greater than 50% of this time spent on counseling/coordination of care: Yes.  See ACP Note from today for additional billable service: Yes.     We appreciate the consult. The [facility] Supportive Care Team can be reached by pager [name] Gross) or email ([email or web address]).   ??  ??  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     Interval History:   Kasch reports feeling well today. He is anticipating repeating an ultrasound to determine need for anticoagulation. Thinks otherwise things are getting to a point where he could go home soon. Hungry this morning.    Discussed home situation. Feels things are good w/[name] and appreciates that she helps him with his care. Still not trusting family members very much and anticipates having big conversations with them about sharing information about him that he wants to keep private.    Allergies:  Bee pollen and Pollen extracts  ??  Medications:  Scheduled Meds:  ??? benzoyl peroxide   Topical Daily   ??? calcium carbonate  300 mg elem calcium Oral BID   ??? cefTRIAXone  1 g Intravenous Q24H   ??? cholecalciferol (vitamin D3)  5,000 Units Oral Daily   ??? clindamycin   Topical BID   ??? insulin glargine  5 Units Subcutaneous Nightly   ??? insulin lispro  0-5 Units Subcutaneous ACHS   ??? insulin lispro  2-4 Units Subcutaneous ACHS   ??? ketoconazole   Topical Daily   ??? magnesium oxide  400 mg Oral Daily   ??? melatonin  3 mg Oral  QPM   ??? metroNIDAZOLE  500 mg Oral TID   ??? multivitamin, with zinc  2 tablet Oral Daily   ??? mupirocin   Topical TID   ??? mycophenolate  540 mg Oral BID   ??? nystatin  1,000,000 Units Oral TID   ??? pantoprazole  40 mg Oral Daily   ??? potassium & sodium phosphates 250mg   2 packet Oral TID   ??? predniSONE  40 mg Oral Daily   ??? rifAXIMin  550 mg Oral BID   ??? sulfamethoxazole-trimethoprim  1 tablet Oral Q MWF   ??? tacrolimus  3 mg Oral BID   ??? valGANciclovir  450 mg Oral Daily     Continuous Infusions:  ??? lactated Ringers 10 mL/hr (09/21/19 1012)     PRN Meds:.dextrose 50 % in water (D50W), glucagon, glucose, lactulose, ondansetron      Physical Exam:   Vitals:    09/22/19 1455   BP: 115/66   Pulse: 82   Resp: 22   Temp:    SpO2: 100%   ??  Gen: thin young man sitting on edge of bed, conversant and in no distress  Skin: jaundiced  HEENT: +scleral icterus, OP clear, MMM   Chest: normal work of breathing  Abd: protruberant  Psych: affect somewhat flat    Data: Reviewed test results in Epic.        Waldon Reining, MD, MPH  Attending Physician, [facility] Team

## 2019-09-23 LAB — COMPREHENSIVE METABOLIC PANEL
ALBUMIN: 2.7 g/dL — ABNORMAL LOW (ref 3.5–5.0)
AST (SGOT): 124 U/L — ABNORMAL HIGH (ref 19–55)
BILIRUBIN TOTAL: 29.5 mg/dL — ABNORMAL HIGH (ref 0.0–1.2)
BLOOD UREA NITROGEN: 36 mg/dL — ABNORMAL HIGH (ref 7–21)
BUN / CREAT RATIO: 26
CHLORIDE: 104 mmol/L (ref 98–107)
CREATININE: 1.4 mg/dL — ABNORMAL HIGH (ref 0.70–1.30)
EGFR CKD-EPI AA MALE: 84 mL/min/{1.73_m2} (ref >=60)
EGFR CKD-EPI NON-AA MALE: 72 mL/min/{1.73_m2} (ref >=60)
GLUCOSE RANDOM: 133 mg/dL — ABNORMAL HIGH (ref 70–99)
POTASSIUM: 3.8 mmol/L (ref 3.5–5.0)
SODIUM: 133 mmol/L — ABNORMAL LOW (ref 135–145)

## 2019-09-23 LAB — CBC W/ AUTO DIFF
BASOPHILS ABSOLUTE COUNT: 0 10*9/L (ref 0.0–0.1)
EOSINOPHILS ABSOLUTE COUNT: 0 10*9/L (ref 0.0–0.4)
EOSINOPHILS RELATIVE PERCENT: 0 %
HEMATOCRIT: 27.6 % — ABNORMAL LOW (ref 41.0–53.0)
HEMOGLOBIN: 9 g/dL — ABNORMAL LOW (ref 13.5–17.5)
LARGE UNSTAINED CELLS: 1 % (ref 0–4)
LYMPHOCYTES ABSOLUTE COUNT: 1.1 10*9/L — ABNORMAL LOW (ref 1.5–5.0)
LYMPHOCYTES RELATIVE PERCENT: 13.5 %
MEAN CORPUSCULAR HEMOGLOBIN CONC: 32.4 g/dL (ref 31.0–37.0)
MEAN CORPUSCULAR VOLUME: 92.9 fL (ref 80.0–100.0)
MEAN PLATELET VOLUME: 8.5 fL (ref 7.0–10.0)
MONOCYTES RELATIVE PERCENT: 6.2 %
NEUTROPHILS ABSOLUTE COUNT: 6.6 10*9/L (ref 2.0–7.5)
NEUTROPHILS RELATIVE PERCENT: 79.1 %
NUCLEATED RED BLOOD CELLS: 1 /100{WBCs} (ref <=4)
PLATELET COUNT: 272 10*9/L (ref 150–440)
RED BLOOD CELL COUNT: 2.98 10*12/L — ABNORMAL LOW (ref 4.50–5.90)
RED CELL DISTRIBUTION WIDTH: 24.6 % — ABNORMAL HIGH (ref 12.0–15.0)
WBC ADJUSTED: 8.3 10*9/L (ref 4.5–11.0)

## 2019-09-23 LAB — PHOSPHORUS: Phosphate:MCnc:Pt:Ser/Plas:Qn:: 3.6

## 2019-09-23 LAB — TACROLIMUS, TROUGH: Lab: 3.6 — ABNORMAL LOW

## 2019-09-23 LAB — BILIRUBIN DIRECT: Bilirubin.glucuronidated+Bilirubin.albumin bound:MCnc:Pt:Ser/Plas:Qn:: 27.09 — ABNORMAL HIGH

## 2019-09-23 LAB — ALKALINE PHOSPHATASE: Alkaline phosphatase:CCnc:Pt:Ser/Plas:Qn:: 865 — ABNORMAL HIGH

## 2019-09-23 LAB — SMEAR REVIEW

## 2019-09-23 LAB — HEMATOCRIT: Hematocrit:VFr:Pt:Bld:Qn:: 27.6 — ABNORMAL LOW

## 2019-09-23 LAB — MAGNESIUM: Magnesium:MCnc:Pt:Ser/Plas:Qn:: 1.5 — ABNORMAL LOW

## 2019-09-23 LAB — GAMMA GLUTAMYL TRANSFERASE: Gamma glutamyl transferase:CCnc:Pt:Ser/Plas:Qn:: 495 — ABNORMAL HIGH

## 2019-09-23 LAB — INR: Coagulation tissue factor induced.INR:RelTime:Pt:PPP:Qn:Coag: 1.46

## 2019-09-23 LAB — AMMONIA: Ammonia:SCnc:Pt:Plas:Qn:: 22

## 2019-09-23 LAB — APTT: Coagulation surface induced:Time:Pt:PPP:Qn:Coag: 30.2

## 2019-09-23 LAB — PROTIME-INR: INR: 1.46

## 2019-09-23 NOTE — Unmapped (Signed)
Pt was afebrile with VSS overnight. No reports of pain or nausea. Pt voiding well, icteric colored urine. Continues on enteric precautions, awaiting GI panel. Remains on Na Restrict diet, provided patient with low sodium menu. Mom at bedside overnight. Plan for [place] of RUE today,      Problem: Adult Inpatient Plan of Care  Goal: Plan of Care Review  Outcome: Ongoing - Unchanged  Goal: Patient-Specific Goal (Individualization)  Outcome: Ongoing - Unchanged  Goal: Absence of Hospital-Acquired Illness or Injury  Outcome: Ongoing - Unchanged  Goal: Optimal Comfort and Wellbeing  Outcome: Ongoing - Unchanged  Goal: Readiness for Transition of Care  Outcome: Ongoing - Unchanged  Goal: Rounds/Family Conference  Outcome: Ongoing - Unchanged     Problem: Self-Care Deficit  Goal: Improved Ability to Complete Activities of Daily Living  Outcome: Ongoing - Unchanged     Problem: Fall Injury Risk  Goal: Absence of Fall and Fall-Related Injury  Outcome: Ongoing - Unchanged     Problem: Wound  Goal: Optimal Wound Healing  Outcome: Ongoing - Unchanged     Problem: Adjustment to Illness (Liver Failure)  Goal: Optimal Coping with Liver Failure  Outcome: Ongoing - Unchanged     Problem: Neurologic Function Impaired (Liver Failure)  Goal: Optimal Neurologic Function  Outcome: Ongoing - Unchanged     Problem: Oral Intake Inadequate (Liver Failure)  Goal: Optimal Nutrition Intake  Outcome: Ongoing - Unchanged     Problem: Infection  Goal: Infection Symptom Resolution  Outcome: Ongoing - Unchanged

## 2019-09-23 NOTE — Unmapped (Signed)
Pediatric Daily Progress Note     Assessment/Plan:     Active Problems:    Liver transplant rejection (CMS-HCC)    AKI (acute kidney injury) (CMS-HCC)    Fever  Resolved Problems:    * No resolved hospital problems. *    [name] Gross is a 18 y.o. 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 Patient was transfered to acute care floor on 11/9. Patient found to have a right upper extremity DVT on PVL on 11/9. Unclear if clot is old (could be 2/2 to PICC line), and with Balen being asymptomatic and in consultation with Heme/onc no anticoagulation given in setting of G1 esophageal varices. Repeat PVL today showed stable clots. [name] Gross requires continued admission for continued optimization to be enlisted on liver transplant list, with plan to discharge home to grandma tomorrow.    Neuro: Hepatic encephalopathy, resolved  -Rifaximin 550mg  BID  -Lactulose 20 g BID PRN (hold for diarrhea)    CV: Patient saturating high 90s-100% on RA with stable vitals    PULM:   Pleural effusion: Patient saturating high 90s-100% on RA with stable vitals. On physical exam clear breath sounds bilaterally, much improved.   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 Melrose Nakayama managing)   -Tac level 3.6- appreciate pharmacy assistance with dosing   -Myfortic 540mg  BID   -Prednisone 40mg  daily  -Protonix 40mg  daily  -MVI, Mag, calcium, Vit D3  -Zofran q8 PRN for nausea  -Low Na diet  -Daily CMP, GGT, DBili, PT/INR, APTT, CBC    ID:  Fever in immunocompromised patient: febrile at home on 11/7, however continues to be afebrile during his hospital stay. Blood culture NGTD, Ucx neg, RPP/COVID neg, paracentesis cytology not consistent with SBP, echo without endocarditis, and LP deferred given clinical improvement. We continued empiric antibiotic treatment which will end on 11/16 for a total 10 day course. Resolved diarrhea, and GIPP and Cdiff are negative. +Fungitel, however ID believes this is a result of his oral candidiasis.   - Adult transplant ID following, appreciate recs  - +Fungitel, however ID believes this is a result of his oral candidiasis   -If oral candidiasis reappears, will get fungal culture   -Continue nystatin  -Ceftriaxone/flagyl, plan to switch to PO ciprofloxacin tomorrow if EKG shows wnl QTC  (ends 11/16)  -Bactrim PPx MWF  -Valgancyclovir PPx daily    HEM:  Right upper extremity DVT: Evidence of acute obstruction is seen on right upper extremity PVL.  - Per Heme/Onc:    -Hold off on lovenox given G1 esophageal varices and asymptomatic   -Repeat PVL with stable clots from 11/9   - Goal Xa level 0.5-1  - Follow up with Heme/Onc after discharge    Renal:   AKI: Cr improved to 1.25 (1.29) . I/Os not able to interpret as patient continues to urinate in toilet and flushing. Appears to volume up on exam however, and has gained weight today 54.3 kg from 52.6 kg 11/11.  -Set a PO goal of 1600 ml  -No mIVF at this time  ??  Endocrine:  Steroid induced hyperglycemia:   -Adult endocrinology following, appreciate  recs  -Continue Lantus??5??units at bedtime (typically on 10u lantus nightly at home)  -Nutritional insulin when eating:??Lispro??2-4??units with meals (Hold if NPO)  -Continue Lispro correction 1:50>150 ACHS  -PRN glucose tablet, glucagon for hypoglycemia??  -Discussed outpatient insulin regimen today with endocrine, plan in place  ??  Derm: Oral thrush, steroid induced acne??and atypical folliculitis.  -Nystatin swish TID  -Topical clindamycin, benzoyl peroxide, ketoconazole shampoo  ??  Complex social situation:   Hx of significant social stressors, patient cut ties with all family members. Appears to be mending the relationship with his parents. Lives primarily with paternal grandmother. [name] is HCPOA per ACP documentation.    Access: PIV    Discharge Criteria: medical optimization to help patient qualify for enlistment for liver transplant    Plan of care discussed with patient and mom at bedside.      Subjective:     Interval History: No acute events overnight. Patient says he feels great and is ready to go home. Mom is at the bedside and all questions were answered.     Objective:     Vital signs in last 24 hours:  Temp:  [36.7 ??C-37.2 ??C] 36.8 ??C  Heart Rate:  [75-90] 81  Resp:  [18-20] 18  BP: (98-112)/(50-72) 112/72  MAP (mmHg):  [64-79] 70  SpO2:  [99 %-100 %] 99 %  Intake/Output last 3 shifts:  I/O last 3 completed shifts:  In: 1840 [P.O.:1790; IV Piggyback:50]  Out: 1450 [Urine:1450]    Physical Exam:  Gen: Resting comfortably sitting up in bed in no acute distress  Eyes: Sclera icteric, conjunctivae non-injected  ENT: External ear normal, mucous membranes moist  Heart: Regular rate and rhythm without murmurs, rubs, or gallops.  S1 and S2 normal. 2+ radial pulses   Pulmonary: Good air movement. Clear breath sounds bilaterally. Normal work of breathing  Skin: No rashes  Abd: Normal bowel sounds, soft, RUQ tenderness, and mild abd distention.   Ext: Warm and well perfused without cyanosis, clubbing, Edema improved today  Neuro: No focal findings    Active Medications reviewed and KEY Medications include:    Current Facility-Administered Medications:   ???  benzoyl peroxide 5 % gel, , Topical, Daily, Alfredia Ferguson, MD  ???  calcium carbonate (OS-CAL) tablet 300 mg elem calcium, 300 mg elem calcium, Oral, BID, Randall Hiss, MD, 300 mg elem calcium at 09/23/19 0839  ???  cefTRIAXone dilution (ROCEPHIN) 40 mg/mL injection 1 g, 1 g, Intravenous, Q24H, [name], MD, Last Rate: 50 mL/hr at 09/22/19 1834, 1 g at 09/22/19 1834  ???  cholecalciferol (vitamin D3) tablet 5,000 Units, 5,000 Units, Oral, Daily, Randall Hiss, MD, 5,000 Units at 09/23/19 7846  ???  clindamycin (CLEOCIN T) 1 % lotion, , Topical, BID, Randall Hiss, MD, Stopped at 09/23/19 0900  ???  dextrose 50 % in water (D50W) 50 % solution 12.5 g, 12.5 g, Intravenous, Q10 Min PRN, Randall Hiss, MD  ???  glucagon injection 1 mg, 1 mg, Intravenous, Once PRN, Alfredia Ferguson, MD  ???  glucose chewable tablet 4 g, 4 g, Oral, 4x Daily PRN, Randall Hiss, MD  ???  insulin glargine (LANTUS) injection 5 Units, 5 Units, Subcutaneous, Nightly, Lewie Loron, MD, 5 Units at 09/22/19 2112  ???  insulin lispro (HumaLOG) injection 0-5 Units, 0-5 Units, Subcutaneous, ACHS, Rolanda Lundborg, [place], 1 Units at 09/23/19 1501  ???  insulin lispro (HumaLOG) injection 2-4 Units, 2-4 Units, Subcutaneous, ACHS,  Rolanda Lundborg, [place], 2 Units at 09/23/19 1504  ???  ketoconazole (NIZORAL) 2 % shampoo, , Topical, Daily, Randall Hiss, MD, Stopped at 09/23/19 0900  ???  lactated Ringers infusion, 10 mL/hr, Intravenous, Continuous, Pia Mau, MD, Last Rate: 10 mL/hr at 09/21/19 1012, 10 mL/hr at 09/21/19 1012  ???  lactulose (CHRONULAC) oral solution, 20 g, Oral, BID PRN, Lewie Loron, MD  ???  magnesium oxide (MAG-OX) tablet 400 mg, 400 mg, Oral, Daily, Randall Hiss, MD, 400 mg at 09/23/19 5784  ???  melatonin tablet 3 mg, 3 mg, Oral, QPM, Randall Hiss, MD, 3 mg at 09/22/19 2020  ???  metroNIDAZOLE (FLAGYL) tablet 500 mg, 500 mg, Oral, TID, Morrison Old, MD, 500 mg at 09/23/19 1520  ???  multivitamin, with zinc (AQUADEKS) chewable tablet, 2 tablet, Oral, Daily, Randall Hiss, MD, 2 tablet at 09/23/19 6962  ???  mycophenolate (MYFORTIC) EC tablet 540 mg, 540 mg, Oral, BID, Randall Hiss, MD, 540 mg at 09/23/19 9528  ???  nystatin (MYCOSTATIN) oral suspension, 1,000,000 Units, Oral, TID, Randall Hiss, MD, 1,000,000 Units at 09/23/19 1507  ???  ondansetron (ZOFRAN-ODT) disintegrating tablet 4 mg, 4 mg, Oral, Q8H PRN, Randall Hiss, MD  ???  pantoprazole (PROTONIX) EC tablet 40 mg, 40 mg, Oral, Daily, Randall Hiss, MD, 40 mg at 09/23/19 0837  ???  potassium & sodium phosphates 250mg  (PHOS-NAK/NEUTRA PHOS) packet 2 packet, 2 packet, Oral, TID, Randall Hiss, MD, 2 packet at 09/23/19 1507  ???  predniSONE (DELTASONE) tablet 40 mg, 40 mg, Oral, Daily, Randall Hiss, MD, 40 mg at 09/23/19 0838  ???  rifAXIMin (XIFAXAN) tablet 550 mg, 550 mg, Oral, BID, Randall Hiss, MD, 550 mg at 09/23/19 4132  ???  sulfamethoxazole-trimethoprim (BACTRIM) 400-80 mg tablet 80 mg of trimethoprim, 1 tablet, Oral, Q MWF, Randall Hiss, MD, 80 mg of trimethoprim at 09/23/19 0838  ???  tacrolimus (PROGRAF) capsule 3 mg, 3 mg, Oral, BID, Lewie Loron, MD, 3 mg at 09/23/19 4401  ???  valGANciclovir (VALCYTE) tablet 450 mg, 450 mg, Oral, Daily, Randall Hiss, MD, 450 mg at 09/23/19 0272        Studies: Personally reviewed and interpreted.  Labs/Studies:  Labs and Studies from the last 24hrs per EMR and Reviewed and   All lab results last 24 hours:    Recent Results (from the past 24 hour(s))   POCT Glucose    Collection Time: 09/22/19  8:15 PM   Result Value Ref Range    Glucose, POC 192 (H) 70 - 179 mg/dL   Tacrolimus Level, Trough    Collection Time: 09/23/19  8:33 AM   Result Value Ref Range    Tacrolimus, Trough 3.6 (L) 5.0 - 15.0 ng/mL   PT-INR    Collection Time: 09/23/19  8:33 AM   Result Value Ref Range    PT 17.1 (H) 10.5 - 13.5 sec    INR 1.46    Gamma GT (GGT)    Collection Time: 09/23/19  8:33 AM   Result Value Ref Range    GGT 495 (H) 12 - 109 U/L   Comprehensive Metabolic Panel    Collection Time: 09/23/19  8:33 AM   Result Value Ref Range    Sodium 133 (L) 135 - 145 mmol/L    Potassium 3.8 3.5 - 5.0 mmol/L    Chloride 104 98 - 107 mmol/L    Anion Gap  CO2      BUN 36 (H) 7 - 21 mg/dL    Creatinine 9.56 (H) 0.70 - 1.30 mg/dL    BUN/Creatinine Ratio 26     EGFR CKD-EPI Non-African American, Male 72 >=60 mL/min/1.74m2    EGFR CKD-EPI African American, Male 31 >=60 mL/min/1.73m2    Glucose 133 (H) 70 - 99 mg/dL    Calcium 8.4 (L) 8.5 - 10.2 mg/dL    Albumin 2.7 (L) 3.5 - 5.0 g/dL    Total Protein      Total Bilirubin 29.5 (H) 0.0 - 1.2 mg/dL    AST 213 (H) 19 - 55 U/L    ALT      Alkaline Phosphatase 865 (H) 65 - 260 U/L   Magnesium Level    Collection Time: 09/23/19  8:33 AM   Result Value Ref Range    Magnesium 1.5 (L) 1.6 - 2.2 mg/dL   Phosphorus Level    Collection Time: 09/23/19  8:33 AM   Result Value Ref Range    Phosphorus 3.6 2.9 - 4.7 mg/dL   Bilirubin, Direct    Collection Time: 09/23/19  8:33 AM   Result Value Ref Range    Bilirubin, Direct 27.09 (H) 0.00 - 0.40 mg/dL   APTT    Collection Time: 09/23/19  8:33 AM   Result Value Ref Range    APTT 30.2 25.3 - 37.1 sec    Heparin Correlation 0.2    Ammonia    Collection Time: 09/23/19  8:33 AM   Result Value Ref Range    Ammonia 22 9 - 33 umol/L   CBC w/ Differential    Collection Time: 09/23/19  8:33 AM   Result Value Ref Range    WBC 8.3 4.5 - 11.0 10*9/L    RBC 2.98 (L) 4.50 - 5.90 10*12/L    HGB 9.0 (L) 13.5 - 17.5 g/dL    HCT 08.6 (L) 57.8 - 53.0 %    MCV 92.9 80.0 - 100.0 fL    MCH 30.1 26.0 - 34.0 pg    MCHC 32.4 31.0 - 37.0 g/dL    RDW 46.9 (H) 62.9 - 15.0 %    MPV 8.5 7.0 - 10.0 fL    Platelet 272 150 - 440 10*9/L    nRBC 1 <=4 /100 WBCs    Variable HGB Concentration Slight (A) Not Present    Neutrophils % 79.1 %    Lymphocytes % 13.5 %    Monocytes % 6.2 %    Eosinophils % 0.0 %    Basophils % 0.3 %    Absolute Neutrophils 6.6 2.0 - 7.5 10*9/L    Absolute Lymphocytes 1.1 (L) 1.5 - 5.0 10*9/L    Absolute Monocytes 0.5 0.2 - 0.8 10*9/L    Absolute Eosinophils 0.0 0.0 - 0.4 10*9/L    Absolute Basophils 0.0 0.0 - 0.1 10*9/L    Large Unstained Cells 1 0 - 4 %    Microcytosis Moderate (A) Not Present    Macrocytosis Moderate (A) Not Present    Anisocytosis Marked (A) Not Present    Hypochromasia Moderate (A) Not Present   Morphology Review Collection Time: 09/23/19  8:33 AM   Result Value Ref Range    Smear Review Comments See Comment (A) Undefined   POCT Glucose    Collection Time: 09/23/19  8:50 AM   Result Value Ref Range    Glucose, POC 127 70 - 179 mg/dL   POCT Glucose    Collection Time:  09/23/19  2:57 PM   Result Value Ref Range    Glucose, POC 183 (H) 70 - 179 mg/dL     ========================================  Jonna Clark, MD  Anesthesiology - PGY-1  Pager: [phone]

## 2019-09-23 NOTE — Unmapped (Signed)
Pediatric Daily Progress Note     Assessment/Plan:     Active Problems:    Liver transplant rejection (CMS-HCC)    AKI (acute kidney injury) (CMS-HCC)    Fever  Resolved Problems:    * No resolved hospital problems. *    [name] Gross is a 18 y.o. 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 Patient was transfered to acute care floor on 11/9. Now found to have a right upper extremity DVT on PVL. Unclear if clot is old (could be 2/2 to PICC line) so as [name] Gross is asymptomatic and in consultation with Heme/onc will stop anticoagulation and reassess on Friday 11/13 with repeat dopplers. He requires continued admission for continued optimization to be enlisted on liver transplant list.     Neuro: Hepatic encephalopathy, resolved  -Rifaximin 550mg  BID  -Lactulose 20 g BID PRN (hold for diarrhea)    CV: Patient saturating high 90s-100% on RA with stable vitals  - Discontinue CRM    PULM:   Pleural effusion: Patient saturating high 90s-100% on RA with stable vitals. Weight did increase today from 52.6 kg to 54.3 kg, however no SOB or 02 requirement at this time.   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 Melrose Nakayama managing)   -Myfortic 540mg  BID   -Prednisone 40mg  daily  -Protonix 40mg  daily  -MVI, Mag, calcium, Vit D3  -Zofran q8 PRN for nausea  -Low Na diet- patient okay to eat 2 trays of food per meal  -Daily CMP, GGT, DBili, PT/INR, APTT, CBC    ID:  Fever in immunocompromised patient: febrile at home on 11/7, however continues to be afebrile during his hospital stay. Blood culture NGTD, Ucx neg, RPP/COVID neg, paracentesis cytology not consistent with SBP, echo without endocarditis, and LP deferred given clinical improvement. We continued empiric antibiotic treatment which will end on 11/16 for a total 10 day course. Patient has new resolving diarrhea, and GIPP and Cdiff are negative. +Fungitel, however ID believes this is a result of his oral candidiasis.   -Adult transplant ID following, appreciate recs  - +Fungitel, however ID believes this is a result of his oral candidiasis   -If oral candidiasis reappears, will get fungal culture   -Continue nystatin  -Ceftriaxone/flagyl??to end 11//16  -Bactrim PPx MWF  -Valgancyclovir PPx daily    HEM:  Right upper extremity DVT: Evidence of acute obstruction is seen on right upper extremity PVL.  - Per Heme/Onc:    -Hold off on lovenox given G1 esophageal varices and asymptomatic   -Consider therapeutic anticoagulation if becomes symptomatic (swelling in arms) or clot burden increases on repeat PVL on 11/13   - Goal Xa level 0.5-1  - Follow up with Heme/Onc after discharge    Renal:   AKI: Cr improved to 1.25 (1.29) . I/Os not able to interpret as patient continues to urinate in toilet and flushing. Appears to volume up on exam however, and has gained weight today 54.3 kg from 52.6 kg 11/11.  -Set a PO goal of 1600 ml  -No mIVF at this time  ??  Endocrine:  Steroid induced hyperglycemia:   -Adult endocrinology following, appreciate recs  -Continue Lantus??5??units at bedtime (typically on 10u lantus nightly at home)  -Nutritional insulin when eating:??Lispro??2-4??units with meals (Hold if NPO)  -Continue Lispro correction 1:50>150 ACHS  -PRN glucose tablet, glucagon for hypoglycemia??  -Discussed outpatient insulin regimen today with endocrine, plan in place  ??  Derm: Oral thrush, steroid induced acne??and atypical folliculitis.  -Nystatin swish TID  -Topical clindamycin, benzoyl peroxide, ketoconazole shampoo  ??  Complex social situation:   Hx of significant social stressors, patient cut ties with all family members. Appears to be mending the relationship with his parents. Lives primarily with paternal grandmother. [name] is HCPOA per ACP documentation.    Access: PIV    Discharge Criteria: medical optimization to help patient qualify for enlistment for liver transplant    Plan of care discussed with caregiver(s) at bedside.      Subjective:     Interval History: No acute events overnight. States he is starving and 1 plate of food each meal is just not enough food. Otherwise says he feels great.     Objective:     Vital signs in last 24 hours:  Temp:  [36.6 ??C-36.9 ??C] 36.8 ??C  Heart Rate:  [69-82] 82  SpO2 Pulse:  [71] 71  Resp:  [16-28] 22  BP: (90-126)/(50-75) 115/66  MAP (mmHg):  [67-88] 81  SpO2:  [99 %-100 %] 100 %  Intake/Output last 3 shifts:  I/O last 3 completed shifts:  In: 2555 [P.O.:2105; I.V.:400; IV Piggyback:50]  Out: 1500 [Urine:1500]    Physical Exam:  Gen: Resting comfortably sitting up in bed in no acute distress  Eyes: Sclera icteric, conjunctivae non-injected  ENT: External ear normal, mucous membranes moist  Heart: Regular rate and rhythm without murmurs, rubs, or gallops.  S1 and S2 normal. 2+ radial pulses   Pulmonary: Good air movement. Diminish at the bases bilaterally. Normal work of breathing  Skin: No rashes  Abd: Normal bowel sounds, soft, RUQ tenderness, and mild abd distention.   Ext: Warm and well perfused without cyanosis, clubbing, Edema improved today  Neuro: No focal findings    Active Medications reviewed and KEY Medications include:    Current Facility-Administered Medications:   ???  benzoyl peroxide 5 % gel, , Topical, Daily, Alfredia Ferguson, MD, 1 application at 09/22/19 0924  ???  calcium carbonate (OS-CAL) tablet 300 mg elem calcium, 300 mg elem calcium, Oral, BID, Randall Hiss, MD, 300 mg elem calcium at 09/22/19 0912  ???  cefTRIAXone dilution (ROCEPHIN) 40 mg/mL injection 1 g, 1 g, Intravenous, Q24H, Verble Styron Loma Messing, MD, Last Rate: 50 mL/hr at 09/22/19 1834, 1 g at 09/22/19 1834  ???  cholecalciferol (vitamin D3) tablet 5,000 Units, 5,000 Units, Oral, Daily, Randall Hiss, MD, 5,000 Units at 09/22/19 1149  ???  clindamycin (CLEOCIN T) 1 % lotion, , Topical, BID, Randall Hiss, MD  ???  dextrose 50 % in water (D50W) 50 % solution 12.5 g, 12.5 g, Intravenous, Q10 Min PRN, Randall Hiss, MD  ???  glucagon injection 1 mg, 1 mg, Intravenous, Once PRN, Alfredia Ferguson, MD  ???  glucose chewable tablet 4 g, 4 g, Oral, 4x Daily PRN, Randall Hiss, MD  ???  insulin glargine (LANTUS) injection 5 Units, 5 Units, Subcutaneous, Nightly, Lewie Loron, MD, 5 Units at 09/21/19 2054  ???  insulin lispro (HumaLOG) injection 0-5 Units, 0-5 Units, Subcutaneous, ACHS, Rolanda Lundborg, [place], 2 Units  at 09/21/19 1524  ???  insulin lispro (HumaLOG) injection 2-4 Units, 2-4 Units, Subcutaneous, ACHS, [place], [place], [place] Units at 09/22/19 1354  ???  ketoconazole (NIZORAL) 2 % shampoo, , Topical, Daily, Randall Hiss, MD  ???  lactated Ringers infusion, 10 mL/hr, Intravenous, Continuous, Pia Mau, MD, Last Rate: 10 mL/hr at 09/21/19 1012, 10 mL/hr at 09/21/19 1012  ???  lactulose (CHRONULAC) oral solution, 20 g, Oral, BID PRN, Lewie Loron, MD  ???  magnesium oxide (MAG-OX) tablet 400 mg, 400 mg, Oral, Daily, Randall Hiss, MD, 400 mg at 09/22/19 0911  ???  melatonin tablet 3 mg, 3 mg, Oral, QPM, Randall Hiss, MD, 3 mg at 09/21/19 2041  ???  metroNIDAZOLE (FLAGYL) tablet 500 mg, 500 mg, Oral, TID, Morrison Old, MD, 500 mg at 09/22/19 1412  ???  multivitamin, with zinc (AQUADEKS) chewable tablet, 2 tablet, Oral, Daily, Randall Hiss, MD, 2 tablet at 09/22/19 0908  ???  mupirocin (BACTROBAN) 2 % ointment, , Topical, TID, Randall Hiss, MD  ???  mycophenolate (MYFORTIC) EC tablet 540 mg, 540 mg, Oral, BID, Randall Hiss, MD, 540 mg at 09/22/19 0908  ???  nystatin (MYCOSTATIN) oral suspension, 1,000,000 Units, Oral, TID, Randall Hiss, MD, 1,000,000 Units at 09/22/19 1411  ???  ondansetron (ZOFRAN-ODT) disintegrating tablet 4 mg, 4 mg, Oral, Q8H PRN, Randall Hiss, MD  ???  pantoprazole (PROTONIX) EC tablet 40 mg, 40 mg, Oral, Daily, Randall Hiss, MD, 40 mg at 09/22/19 0911  ???  potassium & sodium phosphates 250mg  (PHOS-NAK/NEUTRA PHOS) packet 2 packet, 2 packet, Oral, TID, Randall Hiss, MD, 2 packet at 09/22/19 1411  ???  predniSONE (DELTASONE) tablet 40 mg, 40 mg, Oral, Daily, Randall Hiss, MD, 40 mg at 09/22/19 0910  ???  rifAXIMin (XIFAXAN) tablet 550 mg, 550 mg, Oral, BID, Randall Hiss, MD, 550 mg at 09/22/19 0910  ???  sulfamethoxazole-trimethoprim (BACTRIM) 400-80 mg tablet 80 mg of trimethoprim, 1 tablet, Oral, Q MWF, Randall Hiss, MD, 80 mg of trimethoprim at 09/21/19 1310  ???  tacrolimus (PROGRAF) capsule 3 mg, 3 mg, Oral, BID, Lewie Loron, MD, 3 mg at 09/22/19 1610  ???  valGANciclovir (VALCYTE) tablet 450 mg, 450 mg, Oral, Daily, Randall Hiss, MD, 450 mg at 09/22/19 0908        Studies: Personally reviewed and interpreted.  Labs/Studies:  Labs and Studies from the last 24hrs per EMR and Reviewed and   All lab results last 24 hours:    Recent Results (from the past 24 hour(s))   POCT Glucose    Collection Time: 09/21/19  8:32 PM   Result Value Ref Range    Glucose, POC 142 70 - 179 mg/dL   PT-INR    Collection Time: 09/22/19  6:04 AM   Result Value Ref Range    PT 17.5 (H) 10.5 - 13.5 sec    INR 1.50    Gamma GT (GGT)    Collection Time: 09/22/19  6:04 AM   Result Value Ref Range    GGT 434 (H) 12 - 109 U/L   Comprehensive Metabolic Panel    Collection Time: 09/22/19  6:04 AM   Result Value Ref Range    Sodium 136 135 - 145 mmol/L    Potassium 4.0 3.5 - 5.0 mmol/L    Chloride 109 (H) 98 - 107 mmol/L    Anion Gap      CO2  BUN 25 (H) 7 - 21 mg/dL    Creatinine 2.95 6.21 - 1.30 mg/dL    BUN/Creatinine Ratio 20     EGFR CKD-EPI Non-African American, Male 68 >=60 mL/min/1.19m2    EGFR CKD-EPI African American, Male >90 >=60 mL/min/1.45m2    Glucose 122 70 - 179 mg/dL    Calcium 8.6 8.5 - 30.8 mg/dL    Albumin 2.6 (L) 3.5 - 5.0 g/dL    Total Protein      Total Bilirubin 26.2 (H) 0.0 - 1.2 mg/dL    AST 657 (H) 19 - 55 U/L    ALT      Alkaline Phosphatase 863 (H) 65 - 260 U/L   Magnesium Level    Collection Time: 09/22/19  6:04 AM   Result Value Ref Range    Magnesium 1.6 1.6 - 2.2 mg/dL   Phosphorus Level    Collection Time: 09/22/19  6:04 AM   Result Value Ref Range    Phosphorus 3.6 2.9 - 4.7 mg/dL   Bilirubin, Direct    Collection Time: 09/22/19  6:04 AM   Result Value Ref Range    Bilirubin, Direct 23.70 (H) 0.00 - 0.40 mg/dL   APTT    Collection Time: 09/22/19  6:04 AM   Result Value Ref Range    APTT 31.6 25.3 - 37.1 sec    Heparin Correlation 0.2    Ammonia    Collection Time: 09/22/19  6:39 AM   Result Value Ref Range    Ammonia 16 9 - 33 umol/L   CBC w/ Differential    Collection Time: 09/22/19  8:38 AM   Result Value Ref Range    WBC 7.4 4.5 - 11.0 10*9/L    RBC 3.10 (L) 4.50 - 5.90 10*12/L    HGB 9.3 (L) 13.5 - 17.5 g/dL    HCT 84.6 (L) 96.2 - 53.0 %    MCV 92.3 80.0 - 100.0 fL    MCH 29.9 26.0 - 34.0 pg    MCHC 32.4 31.0 - 37.0 g/dL    RDW 95.2 (H) 84.1 - 15.0 %    MPV 8.6 7.0 - 10.0 fL    Platelet 308 150 - 440 10*9/L    Variable HGB Concentration Slight (A) Not Present    Neutrophils % 72.3 %    Lymphocytes % 18.3 %    Monocytes % 7.8 %    Eosinophils % 0.1 %    Basophils % 0.4 %    Absolute Neutrophils 5.4 2.0 - 7.5 10*9/L    Absolute Lymphocytes 1.4 (L) 1.5 - 5.0 10*9/L    Absolute Monocytes 0.6 0.2 - 0.8 10*9/L    Absolute Eosinophils 0.0 0.0 - 0.4 10*9/L    Absolute Basophils 0.0 0.0 - 0.1 10*9/L    Large Unstained Cells 1 0 - 4 %    Microcytosis Moderate (A) Not Present    Macrocytosis Moderate (A) Not Present    Anisocytosis Marked (A) Not Present    Hypochromasia Moderate (A) Not Present   POCT Glucose    Collection Time: 09/22/19  8:41 AM   Result Value Ref Range    Glucose, POC 112 70 - 179 mg/dL   POCT Glucose    Collection Time: 09/22/19  1:27 PM   Result Value Ref Range    Glucose, POC 104 70 - 179 mg/dL     ========================================  Jonna Clark, MD  Anesthesiology - PGY-1  Pager: [phone]        I  performed a history and physical examination of the patient and discussed the patient's management with the family and the resident. I personally reviewed the results of all tests, including blood work and images. I reviewed and edited the resident's note and agree with the documented findings and plan of care. I provided direct supervision to the care of the patient. I spent over 50% of a total 35 min counseling and/or coordinating the care of this patient.     Rocco Pauls, MD  Pediatric Gastroenterology

## 2019-09-23 NOTE — Unmapped (Signed)
For additional assistance finding food or other resources in your community contact [employer] [employer].    By phone: DIAL 2-1-1 Or [phone]    Or Check them out online at [email or web address]

## 2019-09-23 NOTE — Unmapped (Signed)
Follow-up Pediatric Palliative Care Consult Note:  ??  Mayjor is a 18 y.o. male seen for pediatric palliative care consultation at the request of Dr. Donzetta Sprung for goals of care, advance care planning, and family support.  ??  Primary Care Provider: [name], MD  ??  History provided by: Patient   ??  Assessment:  ??  Samie is a 18 y.o who is s/p liver transplant in 2017 for suspected PSC with multiple prior epsiodes??of rejection, also history of depression and a complex social situation,??who??was recently discharged after a prolonged admission  with??liver transplant rejection/failure. Rion was encouraged to take measures to improve his candidacy for liver transplant, including addressing social barriers and receiving mental health counseling. He is readmitted with fever, fatigue, AKI.  Found to have RUE DVT and is awaiting further imaging to make a plan for this. Palliative care consult requested for goals of care, advance care planning, and family support.     ??  Summary of Discussion and Recommendations:   ??  1. SYMPTOMS:   - Jadore denies pain, nausea, poor appetite, insomnia, or changes in mood  - Mental status remains tough to assess at times. Short term memory seems affected - unclear if by his chronic liver disease, acute illness, depression, or a combination. He is sharp and focused today, able to recall details of our visit yesterday and the plan the team discussed.  ??  2. GOALS:   - Encarnacion hopes for d/c soon  - Catarino has long term goals which include going back to school and becoming a Runner, broadcasting/film/video or nurse  - There have been discussions about whether redo transplant is a goal for him - he will clearly need continued support, clear expectations, and the opportunity to discuss/address any challenges that arise with the transplant process  ??  3. DECISIONS:   - Information/communication preferences: Socrates likes to receive information directly. As above, some concerns about him being able to retain some information which may be situational when in the hospital but could also berelated to his underlying condition and mood. He will benefit from continued support from his family and surrogate decision makers.   - Transplant: Rylan is aware that redo transplant may be an option and has addressed some of the psychosocial barriers to this already, but he understandably expresses a bit of ambivalence right now.   ??  4. ADVANCE CARE PLANNING  - Levorn has designated his friend Stephenie Acres and grandfather Liana Gerold as surrogate decision makers.  - Code status: Full code. Not discussed today. Nathan is considering his wishes around life-sustaining treatments using an ACP guide and is likely to need additional information about scenarios we might anticipate if his health continues to decline.   - Prognosis: Guarded given severity of liver disease and potential for lack of response to salvage therapies, AKI, and complex social situation with lack of caregiver support.  ??  5. SUPPORT:   - Alonza described limited personal supports during previous admission but has expanded his support network by reconnecting with many family members who have expressed their desire to support him and assist with his care. Currently living with [name]. Mother is involved again and helping with transportation and appointments.   - Jobie would benefit from community palliative care as he navigates decisions about his health care and to assist with symptom management. Contacted [facility] to discuss options and as he is <19 years old he with a life limiting condition, he meets criteria for their  [facility] program. They are not offering palliative care currently but can offer hospice under concurrent care (ie hospice services + continued care aimed at life extension, including transplant if this remains a goal). Discussed with Sahand and grandmother who are interested in a referral - they feel they would benefit from any/all support available given the complexity of Lancer's medical condition. Given anticipated timing of d/c will work on referral outpatient.   ??  6. CARE COORDINATION:   - Care coordinated with [facility] program. Paged CM to discuss - we can make referral outpatient as above since he will d/c soon. Will communicate plan with Dr. Melrose Nakayama who follows [facility] outpatient.         Total time spent with patient for evaluation & management (excluding ACP documented separately): 20 minutes.  Greater than 50% of this time spent on counseling/coordination of care: Yes.  See ACP Note from today for additional billable service: Yes.     We appreciate the consult. The Children[facility] Supportive Care Team can be reached by pager [name]) or email [email or web address]).   ??  ??  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     Interval History:   Malcolm reports feeling well today. Awaiting ultrasound. Hoping to go home soon.      Allergies:  Bee pollen and Pollen extracts  ??  Medications:  Scheduled Meds:  ??? benzoyl peroxide   Topical Daily   ??? calcium carbonate  300 mg elem calcium Oral BID   ??? cefTRIAXone  1 g Intravenous Q24H   ??? cholecalciferol (vitamin D3)  5,000 Units Oral Daily   ??? clindamycin   Topical BID ??? insulin glargine  5 Units Subcutaneous Nightly   ??? insulin lispro  0-5 Units Subcutaneous ACHS   ??? insulin lispro  2-4 Units Subcutaneous ACHS   ??? ketoconazole   Topical Daily   ??? magnesium oxide  400 mg Oral Daily   ??? melatonin  3 mg Oral QPM   ??? metroNIDAZOLE  500 mg Oral TID   ??? multivitamin, with zinc  2 tablet Oral Daily   ??? mupirocin   Topical TID   ??? mycophenolate  540 mg Oral BID   ??? nystatin  1,000,000 Units Oral TID   ??? pantoprazole  40 mg Oral Daily   ??? potassium & sodium phosphates 250mg   2 packet Oral TID   ??? predniSONE  40 mg Oral Daily   ??? rifAXIMin  550 mg Oral BID   ??? sulfamethoxazole-trimethoprim  1 tablet Oral Q MWF   ??? tacrolimus  3 mg Oral BID   ??? valGANciclovir  450 mg Oral Daily     Continuous Infusions:  ??? lactated Ringers 10 mL/hr (09/21/19 1012)     PRN Meds:.dextrose 50 % in water (D50W), glucagon, glucose,  lactulose, ondansetron      Physical Exam:   Vitals:    09/23/19 1143   BP: 108/62   Pulse: 79   Resp: 18   Temp: 36.7 ??C (98.1 ??F)   SpO2: 100%   ??  Gen: thin young man sitting up in bed,no distress. Smiled today when talking about potentially going home.   Skin: jaundiced  HEENT: +scleral icterus, OP clear, MMM   Chest: normal work of breathing  Abd: protruberant  Psych: affect somewhat flat, though conversant     Data: Reviewed test results in Epic.        Waldon Reining, MD, MPH  Attending Physician, [facility] Supportive Care Team

## 2019-09-23 NOTE — Unmapped (Signed)
Diabetes Care Team Follow Up Consult Note     Requesting Attending Physician : [name] Old, MD  Service Requesting Consult : Ped Gastroenterology (PMG)  Primary Care Provider: [name], MD    Assessment and Plan:  IMPRESSION:  [name] Gross is a 18 y.o. 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 We have been consulted at the request of [name] Old, MD to evaluate [name] Gross for hyperglycemia.     RECOMMENDATIONS:  1. Medication induced hyperglycemia, uncontrolled: on prednisone 40mg . Patient likes to snack throughout the day/evening so have made nutritional insulin available achs. BG mostly within range yesterday. Fasting BG 133 this AM. Will continue with current regimen.   - lantus 5 units nightly  - lispro 2-4 units achs (2u with small meal/snack, 4u with large meal)  - lispro 1:50>150    Discharge recs:   - Check blood sugar achs  - lantus 5 units nightly   - If morning BG are elevated (>120) at home he may increase lantus 1 unit per day until seeing fasting glucose <120, with a max dose of lantus 10 units nightly.  - lispro 2-4 units with meals/snacks. Administer 2 units for small meal/snack and 4 units for full meal.   - lispro sensitive correction, 1:50>150 achs  - follow up with PCP or endocrinology in 1-3 weeks    Other problems complicating glycemic control:  Liver transplant  Active Problems:    Liver transplant rejection (CMS-HCC)    AKI (acute kidney injury) (CMS-HCC)    Fever     Thank you for this consult.  We will continue to follow and make recommendations and place orders as appropriate.    Please page with questions or concerns: [name] Gross, [name] Gross: [phone] from 6AM - 3PM on weekdays or endocrine fellow on call: [phone] from 3PM - 6AM on weekdays or on weekends and holidays. If APP cannot be reached, please page the endocrine fellow on call.    Subjective:  Initial encounter HPI:  [name] Gross is a 18 y.o. 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 Endocrinology consulted for steroid-induced hyperglycemia.    Diabetes History:  Patient has a history of Medication induced hyperglycemia.  Current home diabetes regimen: lantus 10u, novalog 3-6u with meals, SSI  Hypoglycemia awareness: yes.    Interval History: Patient says he is feeling okay this morning. He denies nausea and vomiting. Primary team planning for possible discharge tomorrow.    Current Diabetes Inpatient Regimen:  - lantus 5u nightly  - lispro 2-4u achs (2u for small meal, 4u for large meal)  - lispro 1:50>150    Current Nutrition:  Active Orders   Diet    Nutrition Therapy Sodium Restricted; Sodium Restricted (2 gm Na+)       ROS: As per HPI    ??? benzoyl peroxide   Topical Daily   ??? calcium carbonate  300 mg elem calcium Oral BID   ??? cefTRIAXone  1 g Intravenous Q24H   ??? cholecalciferol (vitamin D3)  5,000 Units Oral Daily   ??? clindamycin   Topical BID   ??? insulin glargine  5 Units Subcutaneous Nightly   ??? insulin lispro  0-5 Units Subcutaneous ACHS   ???  insulin lispro  2-4 Units Subcutaneous ACHS   ??? ketoconazole   Topical Daily   ??? magnesium oxide  400 mg Oral Daily   ??? melatonin  3 mg Oral QPM   ??? metroNIDAZOLE  500 mg Oral TID   ??? multivitamin, with zinc  2 tablet Oral Daily   ??? mupirocin   Topical TID   ??? mycophenolate  540 mg Oral BID   ??? nystatin  1,000,000 Units Oral TID   ??? pantoprazole  40 mg Oral Daily   ??? potassium & sodium phosphates 250mg   2 packet Oral TID   ??? predniSONE  40 mg Oral Daily   ??? rifAXIMin  550 mg Oral BID   ??? sulfamethoxazole-trimethoprim  1 tablet Oral Q MWF   ??? tacrolimus  3 mg Oral BID   ??? valGANciclovir  450 mg Oral Daily       Past Medical History: Diagnosis Date   ??? 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 Depression    ??? MSSA bacteremia 08/27/2019   ??? Seasonal allergies    ??? Sickle cell trait (CMS-HCC)    ??? Strep Mitis Central line-associated bloodstream infection 08/27/2019       Family History   Problem Relation Age of Onset   ??? Diabetes Maternal Grandmother    ??? Diabetes Paternal Grandmother    ??? Hypertension Maternal Grandfather    ??? Hypertension Paternal Grandfather    ??? Asthma Brother    ??? Anesthesia problems Neg Hx    ??? Melanoma Neg Hx    ??? Basal cell carcinoma Neg Hx    ??? Squamous cell carcinoma Neg Hx        Social History     Tobacco Use   ??? Smoking status: Never Smoker   ??? Smokeless tobacco: Never Used   Substance Use Topics   ??? Alcohol use: Never     Alcohol/week: 0.0 standard drinks     Frequency: Never     Comment: 1/2 glass of wine on rare occasions   ??? Drug use: Never       OBJECTIVE: Patient was seen face to face. Full exam not completed due to COVID-19/limiting PPE and exposure.  BP 98/50 Comment: pt sleeping on right side - Pulse 89  - Temp 36.9 ??C (98.5 ??F) (Oral)  - Resp 18  - Ht 172.7 cm (5' 8)  - Wt 54.3 kg (119 lb 9.6 oz)  - SpO2 100%  - BMI 18.19 kg/m??   Wt Readings from Last 12 Encounters:   09/22/19 54.3 kg (119 lb 9.6 oz) (4 %, Z= -1.80)*   09/13/19 53.8 kg (118 lb 9.6 oz) (3 %, Z= -1.86)*   09/07/19 56.1 kg (123 lb 10.9 oz) (6 %, Z= -1.53)*   01/21/19 54.1 kg (119 lb 4.3 oz) (4 %, Z= -1.71)*   10/13/18 58.5 kg (129 lb) (15 %, Z= -1.05)*   08/11/18 62.2 kg (137 lb 3.2 oz) (28 %, Z= -0.58)*   07/21/18 61.8 kg (136 lb 3.2 oz) (27 %, Z= -0.62)*   07/16/18 61.8 kg (136 lb 3.2 oz) (27 %, Z= -0.61)*   07/05/18 61.7 kg (136 lb 0.4 oz) (27 %, Z= -0.61)*   06/25/18 61 kg (134 lb 7.7 oz) (25 %, Z= -0.69)*   05/18/18 55.1 kg (121 lb 6.4 oz) (8 %,  Z= -1.41)*   05/15/18 56.1 kg (123 lb 10.9 oz) (10 %, Z= -1.27)*     * Growth percentiles are based on CDC (Boys, 2-20 Years) data.     General: NAD, sitting up in bed  Lungs: normal wob  Psych: calm, engaged  Neuro: alert and oriented    Data Review    BG/insulin reviewed per EMR.   Glucose, POC (mg/dL)   Date Value   09/81/1914 192 (H)   09/22/2019 104   09/22/2019 112   09/21/2019 142   09/21/2019 220 (H)   09/21/2019 148   09/21/2019 133   09/21/2019 138        Summary of labs:  Lab Results   Component Value Date    A1C 5.0 08/17/2019    A1C 4.9 10/13/2018    A1C 7.6 (H) 07/16/2018     Lab Results   Component Value Date    CREATININE 1.25 09/22/2019     Lab Results   Component Value Date    WBC 7.4 09/22/2019    HGB 9.3 (L) 09/22/2019    HCT 28.6 (L) 09/22/2019    PLT 308 09/22/2019       Lab Results   Component Value Date    NA 136 09/22/2019    K 4.0 09/22/2019    CL 109 (H) 09/22/2019    CO2  09/22/2019      Comment:      Specimen icteric.      BUN 25 (H) 09/22/2019    CREATININE 1.25 09/22/2019    GLU 122 09/22/2019    CALCIUM 8.6 09/22/2019    MG 1.6 09/22/2019    PHOS 3.6 09/22/2019       Lab Results   Component Value Date    BILITOT 26.2 (H) 09/22/2019    BILIDIR 23.70 (H) 09/22/2019    PROT  09/22/2019      Comment:      Specimen icteric.      ALBUMIN 2.6 (L) 09/22/2019    ALT  09/22/2019      Comment:      Specimen icteric.    AST 138 (H) 09/22/2019    ALKPHOS 863 (H) 09/22/2019    GGT 434 (H) 09/22/2019       Lab Results   Component Value Date    INR 1.50 09/22/2019    APTT 31.6 09/22/2019

## 2019-09-24 LAB — CBC W/ AUTO DIFF
BASOPHILS ABSOLUTE COUNT: 0 10*9/L (ref 0.0–0.1)
BASOPHILS RELATIVE PERCENT: 0.3 %
EOSINOPHILS ABSOLUTE COUNT: 0 10*9/L (ref 0.0–0.4)
EOSINOPHILS RELATIVE PERCENT: 0.1 %
HEMOGLOBIN: 8.6 g/dL — ABNORMAL LOW (ref 13.5–17.5)
LARGE UNSTAINED CELLS: 2 % (ref 0–4)
LYMPHOCYTES ABSOLUTE COUNT: 1.4 10*9/L — ABNORMAL LOW (ref 1.5–5.0)
LYMPHOCYTES RELATIVE PERCENT: 17.4 %
MEAN CORPUSCULAR HEMOGLOBIN CONC: 31.8 g/dL (ref 31.0–37.0)
MEAN CORPUSCULAR HEMOGLOBIN: 29.4 pg (ref 26.0–34.0)
MEAN PLATELET VOLUME: 9.5 fL (ref 7.0–10.0)
MONOCYTES ABSOLUTE COUNT: 0.5 10*9/L (ref 0.2–0.8)
MONOCYTES RELATIVE PERCENT: 5.5 %
NEUTROPHILS ABSOLUTE COUNT: 6.2 10*9/L (ref 2.0–7.5)
NEUTROPHILS RELATIVE PERCENT: 75.3 %
PLATELET COUNT: 284 10*9/L (ref 150–440)
RED BLOOD CELL COUNT: 2.92 10*12/L — ABNORMAL LOW (ref 4.50–5.90)
RED CELL DISTRIBUTION WIDTH: 24.5 % — ABNORMAL HIGH (ref 12.0–15.0)
WBC ADJUSTED: 8.2 10*9/L (ref 4.5–11.0)

## 2019-09-24 LAB — COMPREHENSIVE METABOLIC PANEL
ALBUMIN: 2.6 g/dL — ABNORMAL LOW (ref 3.5–5.0)
ALKALINE PHOSPHATASE: 815 U/L — ABNORMAL HIGH (ref 65–260)
AST (SGOT): 119 U/L — ABNORMAL HIGH (ref 19–55)
BILIRUBIN TOTAL: 29.1 mg/dL — ABNORMAL HIGH (ref 0.0–1.2)
BLOOD UREA NITROGEN: 34 mg/dL — ABNORMAL HIGH (ref 7–21)
BUN / CREAT RATIO: 25
CALCIUM: 8.5 mg/dL (ref 8.5–10.2)
CHLORIDE: 110 mmol/L — ABNORMAL HIGH (ref 98–107)
EGFR CKD-EPI AA MALE: 88 mL/min/{1.73_m2} (ref >=60)
GLUCOSE RANDOM: 141 mg/dL (ref 70–179)
POTASSIUM: 4.2 mmol/L (ref 3.5–5.0)
SODIUM: 137 mmol/L (ref 135–145)

## 2019-09-24 LAB — AMMONIA: Ammonia:SCnc:Pt:Plas:Qn:: 32

## 2019-09-24 LAB — PROTIME-INR
INR: 1.38
PROTIME: 16.2 s — ABNORMAL HIGH (ref 10.5–13.5)

## 2019-09-24 LAB — APTT
APTT: 29.3 s (ref 25.3–37.1)
Coagulation surface induced:Time:Pt:PPP:Qn:Coag: 29.3

## 2019-09-24 LAB — PHOSPHORUS: Phosphate:MCnc:Pt:Ser/Plas:Qn:: 3.8

## 2019-09-24 LAB — ANISOCYTOSIS

## 2019-09-24 LAB — INR: Coagulation tissue factor induced.INR:RelTime:Pt:PPP:Qn:Coag: 1.38

## 2019-09-24 LAB — GAMMA GLUTAMYL TRANSFERASE: Gamma glutamyl transferase:CCnc:Pt:Ser/Plas:Qn:: 537 — ABNORMAL HIGH

## 2019-09-24 LAB — BILIRUBIN DIRECT: Bilirubin.glucuronidated+Bilirubin.albumin bound:MCnc:Pt:Ser/Plas:Qn:: 26.5 — ABNORMAL HIGH

## 2019-09-24 LAB — MAGNESIUM: Magnesium:MCnc:Pt:Ser/Plas:Qn:: 1.5 — ABNORMAL LOW

## 2019-09-24 LAB — POTASSIUM: Potassium:SCnc:Pt:Ser/Plas:Qn:: 4.2

## 2019-09-24 MED ORDER — ONDANSETRON 4 MG PO TBDP
4.00 | ORAL_TABLET | ORAL | Status: DC
Start: ? — End: 2019-09-24

## 2019-09-24 MED ORDER — BENZOYL PEROXIDE 5 % EX GEL
CUTANEOUS | Status: DC
Start: 2019-09-25 — End: 2019-09-24

## 2019-09-24 MED ORDER — CLINDAMYCIN PHOSPHATE 1 % EX LOTN
TOPICAL_LOTION | CUTANEOUS | Status: DC
Start: 2019-09-24 — End: 2019-09-24

## 2019-09-24 MED ORDER — LACTULOSE 10 GM/15ML PO SOLN
20.00 | ORAL | Status: DC
Start: ? — End: 2019-09-24

## 2019-09-24 MED ORDER — INSULIN LISPRO 100 UNIT/ML ~~LOC~~ SOLN
0.00 | SUBCUTANEOUS | Status: DC
Start: 2019-09-24 — End: 2019-09-24

## 2019-09-24 MED ORDER — LACTATED RINGERS IV SOLN
10.00 | INTRAVENOUS | Status: DC
Start: ? — End: 2019-09-24

## 2019-09-24 MED ORDER — GLUCAGON HCL (DIAGNOSTIC) 1 MG IJ SOLR
1.00 | INTRAMUSCULAR | Status: DC
Start: ? — End: 2019-09-24

## 2019-09-24 MED ORDER — CHOLECALCIFEROL 25 MCG (1000 UT) PO TABS
5000.00 | ORAL_TABLET | ORAL | Status: DC
Start: 2019-09-25 — End: 2019-09-24

## 2019-09-24 MED ORDER — MAGNESIUM OXIDE 400 MG PO TABS
400.00 | ORAL_TABLET | ORAL | Status: DC
Start: 2019-09-25 — End: 2019-09-24

## 2019-09-24 MED ORDER — SULFAMETHOXAZOLE-TRIMETHOPRIM 400-80 MG PO TABS
1.00 | ORAL_TABLET | ORAL | Status: DC
Start: 2019-09-26 — End: 2019-09-24

## 2019-09-24 MED ORDER — NYSTATIN 100000 UNIT/ML MT SUSP
1000000.00 | OROMUCOSAL | Status: DC
Start: 2019-09-24 — End: 2019-09-24

## 2019-09-24 MED ORDER — RIFAXIMIN 550 MG PO TABS
550.00 | ORAL_TABLET | ORAL | Status: DC
Start: 2019-09-24 — End: 2019-09-24

## 2019-09-24 MED ORDER — CALCIUM CARBONATE 600 MG PO TABS
ORAL_TABLET | ORAL | Status: DC
Start: 2019-09-24 — End: 2019-09-24

## 2019-09-24 MED ORDER — MELATONIN 3 MG PO TABS
3.00 | ORAL_TABLET | ORAL | Status: DC
Start: 2019-09-24 — End: 2019-09-24

## 2019-09-24 MED ORDER — GENERIC EXTERNAL MEDICATION
4.00 | Status: DC
Start: ? — End: 2019-09-24

## 2019-09-24 MED ORDER — TACROLIMUS 1 MG PO CAPS
4.00 | ORAL_CAPSULE | ORAL | Status: DC
Start: 2019-09-24 — End: 2019-09-24

## 2019-09-24 MED ORDER — DEXTROSE 50 % IV SOLN
12.50 | INTRAVENOUS | Status: DC
Start: ? — End: 2019-09-24

## 2019-09-24 MED ORDER — VALGANCICLOVIR HCL 450 MG PO TABS
450.00 | ORAL_TABLET | ORAL | Status: DC
Start: 2019-09-25 — End: 2019-09-24

## 2019-09-24 MED ORDER — PREDNISONE 20 MG PO TABS
40.00 | ORAL_TABLET | ORAL | Status: DC
Start: 2019-09-25 — End: 2019-09-24

## 2019-09-24 MED ORDER — KETOCONAZOLE 2 % EX SHAM
MEDICATED_SHAMPOO | CUTANEOUS | Status: DC
Start: 2019-09-25 — End: 2019-09-24

## 2019-09-24 MED ORDER — PANTOPRAZOLE SODIUM 40 MG PO TBEC
40.00 | DELAYED_RELEASE_TABLET | ORAL | Status: DC
Start: 2019-09-25 — End: 2019-09-24

## 2019-09-24 MED ORDER — GENERIC EXTERNAL MEDICATION
540.00 | Status: DC
Start: 2019-09-24 — End: 2019-09-24

## 2019-09-24 MED ORDER — GENERIC EXTERNAL MEDICATION
2.00 | Status: DC
Start: 2019-09-25 — End: 2019-09-24

## 2019-09-24 MED ORDER — INSULIN LISPRO 100 UNIT/ML ~~LOC~~ SOLN
2.00 | SUBCUTANEOUS | Status: DC
Start: 2019-09-24 — End: 2019-09-24

## 2019-09-24 MED ORDER — LEVOFLOXACIN 500 MG PO TABS
750.00 | ORAL_TABLET | ORAL | Status: DC
Start: ? — End: 2019-09-24

## 2019-09-24 MED ORDER — METRONIDAZOLE 500 MG PO TABS
500.00 | ORAL_TABLET | ORAL | Status: DC
Start: 2019-09-24 — End: 2019-09-24

## 2019-09-24 MED ORDER — GENERIC EXTERNAL MEDICATION
2.00 | Status: DC
Start: 2019-09-24 — End: 2019-09-24

## 2019-09-24 MED ORDER — INSULIN GLARGINE 100 UNIT/ML ~~LOC~~ SOLN
5.00 | SUBCUTANEOUS | Status: DC
Start: 2019-09-24 — End: 2019-09-24

## 2019-09-24 MED ORDER — INSULIN LISPRO (U-100) 100 UNIT/ML SUBCUTANEOUS SOLUTION: mL | Freq: Four times a day (QID) | 0 refills | 63 days

## 2019-09-24 MED ORDER — METRONIDAZOLE 500 MG TABLET
ORAL_TABLET | Freq: Three times a day (TID) | ORAL | 0 refills | 3 days | Status: CP
Start: 2019-09-24 — End: 2019-09-26
  Filled 2019-09-24: qty 7, 2d supply, fill #0

## 2019-09-24 MED ORDER — LACTULOSE 10 GRAM/15 ML ORAL SOLUTION
Freq: Every day | ORAL | 0 refills | 8 days | Status: CP | PRN
Start: 2019-09-24 — End: 2019-10-24
  Filled 2019-09-24: qty 240, 8d supply, fill #0

## 2019-09-24 MED ORDER — INSULIN GLARGINE (U-100) 100 UNIT/ML SUBCUTANEOUS SOLUTION
Freq: Every evening | SUBCUTANEOUS | 0 refills | 200.00000 days
Start: 2019-09-24 — End: 2019-10-24

## 2019-09-24 MED ORDER — TACROLIMUS 1 MG CAPSULE
ORAL_CAPSULE | Freq: Two times a day (BID) | ORAL | 0 refills | 30 days | Status: CP
Start: 2019-09-24 — End: 2019-10-24
  Filled 2019-09-24: qty 120, 30d supply, fill #0

## 2019-09-24 MED ORDER — LEVOFLOXACIN 750 MG TABLET
ORAL_TABLET | Freq: Every evening | ORAL | 0 refills | 3.00000 days | Status: CP
Start: 2019-09-24 — End: 2019-09-27
  Filled 2019-09-24: qty 3, 3d supply, fill #0

## 2019-09-24 MED ORDER — NYSTATIN 100,000 UNIT/ML ORAL SUSPENSION
Freq: Three times a day (TID) | ORAL | 0 refills | 7 days
Start: 2019-09-24 — End: 2019-10-01

## 2019-09-24 MED ORDER — INSULIN LISPRO (U-100) 100 UNIT/ML SUBCUTANEOUS SOLUTION
Freq: Four times a day (QID) | SUBCUTANEOUS | 0 refills | 63.00000 days
Start: 2019-09-24 — End: 2019-10-24

## 2019-09-24 MED FILL — TACROLIMUS 1 MG CAPSULE: 30 days supply | Qty: 120 | Fill #0 | Status: AC

## 2019-09-24 MED FILL — LEVOFLOXACIN 750 MG TABLET: 3 days supply | Qty: 3 | Fill #0 | Status: AC

## 2019-09-24 MED FILL — LACTULOSE 10 GRAM/15 ML ORAL SOLUTION: 8 days supply | Qty: 240 | Fill #0 | Status: AC

## 2019-09-24 MED FILL — METRONIDAZOLE 500 MG TABLET: 2 days supply | Qty: 7 | Fill #0 | Status: AC

## 2019-09-24 NOTE — Unmapped (Signed)
Pediatric Tacrolimus Therapeutic Monitoring Pharmacy Note    [name] Gross is a 18 y.o. male continuing tacrolimus.     Indication: Liver transplant     Date of Transplant: 10/02/2016      Prior Dosing Information: Current regimen 3 mg every 12 hours      Dosing Weight: 52.6 kg    Goals:  Therapeutic Drug Levels  Tacrolimus trough goal: 8-10 ng/mL    Additional Clinical Monitoring/Outcomes  ?? Monitor renal function (SCr and urine output) and liver function (LFTs)  ?? Monitor for signs/symptoms of adverse events (e.g., hyperglycemia, hyperkalemia, hypomagnesemia, hypertension, headache, tremor)    Results:   Tacrolimus level: 3.6 ng/mL, drawn appropriately    Pharmacokinetic Considerations and Significant Drug Interactions:  ? Concurrent hepatotoxic medications: None identified  ? Concurrent CYP3A4 substrates/inhibitors: None identified  ? Concurrent nephrotoxic medications: Bactrim prophylaxis    Assessment/Plan: After discussion with the senior GI resident, I recommended increasing to 3.5 or 4 mg every 12 hours to reach his goal of 8-10 ng/mL. Even though he is not at steady state yet, I would not expect him to reach his goal. At the time of writing, a final decision had not been made on what dose he would discharge on. He is likely discharging in the next day or so, and has a follow-up scheduled currently in clinic on 09/29/19.     Recommendedation(s)  Increase regimen to tacrolimus 4 mg PO every 12 hours          Follow-up  ? Next level to be determined by primary team based on discharge date.   ? A pharmacist will continue to monitor and recommend levels as appropriate    Please page service pharmacist with questions/clarifications.    Kelvin Cellar, PharmD

## 2019-09-24 NOTE — Unmapped (Signed)
Pediatric Hospital Medicine (PHM) Discharge Summary    Patient Information:   [name] Gross  Date of Birth: 2001/02/16    Admission/Discharge Information:     Admit Date: 09/18/2019 Admitting Attending: Morrison Old, MD   Discharge Date: 09/24/2019 Discharge Attending: Pablo Ledger, MD   Length of Stay: 6 days Discharge Service: Ped Gastroenterology [place] Day)     Disposition: Home  **Condition at Discharge:   Improved    Final Diagnoses:   Active Problems:    Liver transplant rejection (CMS-HCC)    AKI (acute kidney injury) (CMS-HCC)    Fever  Resolved Problems:    * No resolved hospital problems. *      Reason(s) for Hospitalization:     1. Liver transplant rejection  2. AKI    Hospital Course:   [name] Gross is a 18 y.o. 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 He was recently hospitalized for acute rejection from 9/24-10/31 complicated by MSSA and strep mitis bacteremia s/p 14 days of IV antibiotics. He was admitted to [facility] on 09/18/2019 with fever, fatigue and altered mental status. Hospital course summarized by problem as follows:   ??  CV: No acute intervention. He remained hemodynamically stable throughout his hospital stay. Echo 11/9 with no intracardiac vegetations to suggest endocarditis as cause of fever, and otherwise normal R and L ventricular systolic function. R sided pleural effusion was identified. EKG obtained 11/13 to assist discharge antibiotic choice due to recent borderline QTc ( , Sept 2020) showed normalized QTc ( ) and was otherwise normal.     RESP: Echo and liver U/S 11/8 showed large right-sided pleural effusion, which was treated with albumin and Lasix. On 11/10, CXR showed trace right pleural effusion. He remained stable in room air with normal work of breathing.   ??  FEN/GI: Both pediatric and adult GI followed throughout hospital course. Initial presentation concerning for SBP given his fever, fluid on abdomen, and evidence of large volume ascites on liver U/s on 11/9, however a diagnostic paracentesis was completed and fluid studies were not consistent with SBP. 11/9 liver U/S showed increased and heterogeneous hepatic parenchymal echogenicity consistent with chronic liver disease and mild periportal edema. Hepatic vasculature was otherwise patent. Home Myfortic and prednisone were continued. Home tacrolimus was held due to concern for hand tremor inhibiting ability to administer insulin, however the adult GI team and Dr. Melrose Nakayama decided it was best to restart it. Tacrolimus was started on 11/11 at 3 mg BID and he was discharged on 2mg  BID to prevent further advancement of his AKI as below. Home rifaximin and lactulose were continued and on discharge instructions lactulose to titrate based on patient's bowel movement (initiate once daily and increase/decrease with goal of 3 soft stools/day). He was given a low salt diet. Protonix was continued for GI ppx. Home MVI, Mg, Ca, VitD3 and NaPhos supplements were also continued. He underwent EGD on 11/11 to evaluate esophageal varices due to possible need for anticoagulation of DVT. 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    Pediatric GI (Dr. Nonnie Done) will manage all immunosuppression moving forward.     Most recent MELD score at time of discharge was:   MELD-Na score: 28 at 09/24/2019  5:00 AM  MELD score: 26 at 09/24/2019  5:00 AM  Calculated from:  Serum Creatinine: 1.40 mg/dL at 16/08/9603  5:40 AM  Serum Sodium: 133 mmol/L at 09/23/2019  8:33  AM  Total Bilirubin: 29.5 mg/dL at 16/08/9603  5:40 AM  INR(ratio): 1.38 at 09/24/2019  5:00 AM  Age: 73 years 6 months  ??  ID: History of fever at home (Tmax 103F) and recent admission for MSSA and Strep mitis bacteremia s/p 14 day treatment (Vanc/CTX/Flagyl 10/13-10/16; Unasyn 10/16-10/27). Afebrile on presentation to [facility] and throughout this admission. Admission labs notable for normal WBC (6.5), BCx neg, UCx neg, RPP/COVID neg. HSV1/2 neg, EBV neg, CMV consistent w/ past infection. HIV, RPR, gonorrhea, chlamydia all negative. Echo obtained 11/9 and negative for endocarditis. LP was considered due to AMS, but ultimately deferred due to quick clinical improvement (see Neuro). Paracentesis performed 11/9 not consistent with SBP. Fungitell positive (11/9) in the setting of oral thrush (treated w/ Nystatin TID for 14 day course) , per ID not felt to represent systemic infection. Fungal culture negative. Adult Transplant ID was consulted. He was initially treated empirically for presumed intra-abdominal infection with meropenem 11/8-11/9, transitioned to ceftriaxone/Flagyl 11/8-11/13 and ultimately levofloxacin/Flagyl to complete 10-day course (end 11/16). Bactrim and valgancyclovir ppx were continued. He had several loose stools during admission; GIPP and C diff sent 11/11 were negative and stools improved prior to discharge.     RENAL: Admission creatinine (1.53) improved relative to most recent value from 11/5 (1.81), but still consistent w/ AKI. Chemistries trended daiy w/ Cr nadir of 1.22 on 11/9 in setting of IVF and holding tacrolimus, but increased to 4mg  at time of discharge after tacro restarted. Abdominal ultrasound on 11/8 with nonspecific increased bilateral renal echogenicity. Patient received 25% albumin 11/8-11/9 and 40  Mg Lasix on 11/9 in the setting of ascites and pleural effusion, but did not require further diuresis.     ENDO: Adult endocrinology was consulted given known steroid-induced hyperglycemia on insulin at home. Nightly Lantus initially held in the setting of normoglycemia, but restarted on 11/9 at decreased dose of 5U nightly. Carb coverage was modified to 2-4 units with meals/snacks and was intermittently held when patient NPO. SSI 1:50>150 ACHS was not changed.     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 Heme/Onc was consulted and initially started therapeutic Lovenox (1 dose given), however ultimately decided to discontinue and hold given grade 1 esophageal varices seen on EGD, liver disease, and asymptomatic DVTs. Repeat PVL on 11/13 demonstrated stable DVTs in RUE axial and brachial veins so patient discharged without any therapeutic anticoagulation. Will follow up with pediatric Hem/Onc DVT team.   ??  NEURO: [name] Gross was initially admitted to the floor, but transferred to the PICU as a rapid response was called on 11/8 due to AMS (somnolence) and asterixis concerning for hepatic encephalopathy. Initial ammonia 36. CT Head showed no acute intracranial abnormality. Rifamixin and lactulose were started due to concern for hepatic encephalopathy, and patient's mental status quickly improved. Ammonia on discharge was 32 with normal mental status.      DERM: Dermatology was consulted for steroid-induced acne and atypical dermatitis. He was treated with topical clindamycin and benzoyl peroxide.     PSYCH: Effexor was discontinued in setting of AMS. Patient had multiple sessions of CBT with a transplant psychologist during his stay. He has a follow-up appointment scheduled with Theda Sers on 11/23.  ??  SOCIAL: Hx of significant social stressors, patient cut ties with all family members. Appears to be mending the relationship with his parents as mother was at the bedside for many of his hospitalization days. Lives primarily with paternal grandmother at  this time. [name] Gross is HCPOA per ACP documentation. Keelin expressed interest in palliative care during admission, which Supportive Care (Dr. Jessee Avers) is planning to help coordinate via [facility] in the outpatient setting.     Discharge Exam:   BP 114/60  - Pulse 79  - Temp 37.3 ??C (Oral)  - Resp 20  - Ht 172.7 cm (5' 8)  - Wt 54.5 kg (120 lb 2.4 oz)  - SpO2 100%  - BMI 18.27 kg/m??     Gen: Resting comfortably sitting up in bed in no acute distress  Eyes: Sclera icteric, conjunctivae non-injected  ENT: External ear normal, mucous membranes moist  Heart: Regular rate and rhythm   Pulmonary: Normal work of breathing  Skin: No rashes or lesions noted  Abd: Normal bowel sounds, soft, RUQ tenderness, and mild abd distention.   Ext: Warm and well perfused without cyanosis, clubbing, Edema improved today  Neuro: No focal findings    Studies Pending at Time of Discharge:     Pending Labs     Order Current Status    AFB culture In process    Ascitic/Peritoneal Fluid Culture Preliminary result    Fungal Culture Preliminary result        To be followed up by: Peds GI    Discharge Medications and Orders:   Discharge Medications:     Your Medication List      STOP taking these medications    polyethylene glycol 17 gram packet  Commonly known as: MIRALAX     venlafaxine 37.5 MG 24 hr capsule  Commonly known as: EFFEXOR-XR        START taking these medications    lactulose 10 gram/15 mL solution  Commonly known as: CHRONULAC  Take 30 mL (20 g total) by mouth daily as needed (for constipation).     levoFLOXacin 750 MG tablet  Commonly known as: LEVAQUIN  Take 1 tablet (750 mg total) by mouth nightly for 3 days.     metroNIDAZOLE 500 MG tablet  Commonly known as: FLAGYL  Take 1 tablet (500 mg total) by mouth Three (3) times a day for 2 days.        CHANGE how you take these medications    clindamycin-benzoyl peroxide 1.2 %(1 % base) -5 % gel  Apply to rash on chest  What changed:   ?? how much to take  ?? how to take this  ?? when to take this     insulin glargine 100 unit/mL injection  Commonly known as: LANTUS  Inject 0.05 mL (5 Units total) under the skin nightly.  What changed: how much to take     insulin lispro 100 unit/mL injection  Commonly known as: HumaLOG  Inject 0-0.05 mL (0-5 Units total) under the skin Four (4) times a day (before meals and nightly).  What changed:   ?? how much to take  ?? when to take this     insulin lispro 100 unit/mL injection  Commonly known as: HumaLOG  Inject 0.02-0.04 mL (2-4 Units total) under the skin Four (4) times a day (before meals and nightly).  What changed: You were already taking a medication with the same name, and this prescription was added. Make sure you understand how and when to take each.     magnesium oxide 400 mg (241.3 mg magnesium) tablet  Commonly known as: MAG-OX  Take 1 tablet (400 mg total) by mouth daily.  What changed: when to take this  tacrolimus 1 MG capsule  Commonly known as: PROGRAF  Take 2 capsules (2 mg total) by mouth two (2) times a day.  What changed: how much to take        CONTINUE taking these medications    ACCU-CHEK GUIDE GLUCOSE METER Misc  Generic drug: blood-glucose meter  Use as instructed     ACCU-CHEK GUIDE TEST STRIPS Strp  Generic drug: blood sugar diagnostic  Use to check blood sugar four times daily as directed.     ACCU-CHEK SOFTCLIX LANCETS Misc  Generic drug: lancets  Use to check blood glucose 6 times per day.     AQUADEKS 100-5 mcg-mg Chew  Generic drug: multivitamin, with zinc  Chew 2 tablets daily.     BD INSULIN SYRINGE HALF UNIT 0.3 mL 31 gauge x 5/16 (8 mm) Syrg  Generic drug: insulin syr/ndl U100 half mark  Use to inject insulins up to 6 times daily as directed.     calcium carbonate 1,500 mg (600 mg elem calcium) tablet  Commonly known as: OS-CAL  Take 1/2 tablet (300 mg elem calcium) by mouth Two (2) times a day. (Over the counter med)     cholecalciferol (vitamin D3) 125 mcg (5,000 unit) tablet  Take 1 tablet (5,000 Units total) by mouth daily.     glucose 4 GM chewable tablet  Chew 1 tablet (4 g total) 4 (four) times a day as needed for low blood sugar (refer to hypoglycemia guideline).     GVOKE PFS 2-PACK SYRINGE 0.5 mg/0.1 mL Syrg  Generic drug: glucagon  Inject 0.1 mL under the skin once as needed for up to 1 dose.     ketoconazole 2 % shampoo  Commonly known as: NIZORAL  Apply topically daily as directed.     melatonin 3 mg Tab  Take 1 tablet (3 mg total) by mouth every evening. mycophenolate 180 MG EC tablet  Commonly known as: MYFORTIC  Take 3 tablets (540 mg total) by mouth Two (2) times a day.     nystatin 100,000 unit/mL suspension  Commonly known as: MYCOSTATIN  Take 10 mls (1,000,000 Units total) by mouth Three (3) times a day.     ondansetron 4 MG disintegrating tablet  Commonly known as: ZOFRAN-ODT  Take 1 tablet (4 mg total) by mouth every eight (8) hours as needed for up to 7 days.     pantoprazole 40 MG tablet  Commonly known as: PROTONIX  Take 1 tablet (40 mg total) by mouth daily.     PHOS-NAK 280-160-250 mg Pwpk  Generic drug: potassium & sodium phosphates 250mg   Take 2 packets by mouth Three (3) times a day.     predniSONE 20 MG tablet  Commonly known as: DELTASONE  Take 2 tablets (40 mg total) by mouth daily.     sulfamethoxazole-trimethoprim 400-80 mg per tablet  Commonly known as: BACTRIM  Take 1 tablet (80 mg of trimethoprim total) by mouth Every Monday, Wednesday, and Friday.     valGANciclovir 450 mg tablet  Commonly known as: VALCYTE  Take 1 tablet (450 mg total) by mouth daily.     XIFAXAN 550 mg Tab  Generic drug: rifAXIMin  Take 1 tablet (550 mg total) by mouth Two (2) times a day.        ASK your doctor about these medications    mupirocin 2 % ointment  Commonly known as: BACTROBAN  Apply topically Three (3) times a day for 7 days.  Ask about: Should I  take this medication?             DME Orders:  Resources and Referrals     For additional assistance finding food or other resources in your community contact [place] [address].    By phone: DIAL 2-1-1 Or [phone]    Or Check them out online at [email or web address]                         Discharge Instructions:   Activity:   Activity Instructions     Activity as tolerated          Diet:   Diet Instructions     Discharge diet (specify)      Discharge Nutrition Therapy: Other    Low Sodium Diet             Instructions and Other Follow-ups after Discharge:  Follow Up instructions and Outpatient Referrals     Discharge instructions      Discharge instructions      Discharge instructions      CMV Quantitative PCR, Blood  (Once a week)         Other Instructions:  Other Instructions     Discharge instructions      [facility] Discharge Diabetes and Insulin Management Plan   ??  - You should check your blood glucoses with meals, before snacks and at bedtime  ------------------------------------------------------------------------------  Home Insulin Plan   - Basal Insulin: Lantus 5 units injected subcutaneous every night at bedtime  ??  - Meal Coverage: Humalog Insulin give 2 units if he eats 1/2 meal (or a snack) and 4 units if he eats full meal.   ??  - Sliding Scale Insulin: BEFORE MEALS (breakfast, lunch, dinner, and late night meal if eaten)  Use Humalog Insulin injected subcutaneous as below   - If your blood glucose is 51-70 mg/dL, give juice/crackers    - If your blood glucose is 71-150 mg/dL, give no insulin 0 units   - If your blood glucose is 151-200 mg/dL, give 1 units    - If your blood glucose is 201-250 mg/dL, give 2 units    - If your blood glucose is 251-300 mg/dL, give 3 units    - If your blood glucose is 301-350 mg/dL, give 4 units    - If your blood glucose is 351-400 mg/dL, give  5 units   *If your blood glucose is Greater than 400, please call the [facility] Endocrinologist on-call for further instructions through the [facility] Operator [phone])*    --------------------------------------------------------------------------  Please Call [facility] Endocrinology or the [facility] Endocrinologist on-call through the hospital operator if you have any   - Any Signs or Symptoms of Hyperglycemia or if blood sugar is greater than 300  - Any Signs or Symptoms of Hypoglycemia or of blood sugar is greater than 80   - Any Ketones present in urine   - Any Medication Intolerance   - Any Nausea, vomiting, diarrhea or decreased oral intake   - Any Questions or Concerns   ??  * It is very important that you record your blood glucoses in a log book and that you bring all of your diabetes supplies and glucometer to your outpatient follow-up appointments  ??  IMPORTANT PHONE NUMBERS  - [facility]: [phone]  [facility] Operator: [phone] [facility] Adult Endocrinology on-call      ??         Discharge instructions  Outpatient Follow-Up Appointments Outpatient Follow-Up Appointments  ??  PCP: Leda Min, [facility]  - Date: Please call to make an appointment in the next week   - Clinic Phone: [phone]    [facility] Nutrition Transplant Evaluation with Dr. Angus Seller  - Date: September 29, 2019 at 1:00pm - phone call   - Clinic Phone: [phone]    [facility] Liver Transplant Phone Call   - Date: September 29, 2019 at 2:30pm   - Clinic Phone: [phone]  ??  [facility] Endocrinology Clinic located at [address] [place], [place] [address]    - Date: September 30, 2019 at 1:10pm [facility] VISIT)  - Clinic Phone: [phone]     [facility] Pulmonary Function Tests   - Date: October 03, 2019 at 8:30am     [facility] Lab for outpatient lab collection  - Date: October 03, 2019 at 10:10am     [facility] Radiology - [place]ascan   - Date: October 03, 2019 at 10:30am    [facility] Liver Transplant Clinic with Dr. Celine Mans  - Date: October 03, 2019 at 1:30pm     [facility] Psychology Clinic with Dr. Neale Burly   - Date: October 03, 2019 at 3:00pm     [facility] Pediatric Gastroenterology Clinic with Dr. Melrose Nakayama   - Date: October 04, 2019 at 10:00am for pre-liver transplant              October 04, 2019 at 1:00pm for an in-person appointment  - Clinic Phone: [phone] (appointments) /  [phone] (adminstrative office)     IMPORTANT CONTACT NUMBERS:  Vernia Buff (RN transplant coordinator): [phone]  Luna Kitchens (appointment scheduling for transplant): [phone]  For urgent needs, call [phone] and ask for the on-call liver transplant coordinator; we have 24/7 coverage.         Discharge instructions      Please contact your primary care provider, your [facility] outpatient specialists, or seek additional medical care if you have:   - Any Fever with a temperature greater than 100.36F  - Any Respiratory distress, increased work of breathing, shortness of breath, difficulty breathing, chest pain, chest tightness or activity intolerance  - Any Abdominal distention, abdominal pain, dark or black stools, or yellowing of your skin/ eyes  - Any Changes in behavior, confusion, mental status changes, increased tiredness, increased sleepiness or  decreased energy   - Any Feelings of worsening anxiety or depression  - Any Medication Intolerance  - Any Skin lesions or skin rashes that have worsening redness, swelling, drainage or pain at the sites  - Any Symptoms of dehydration or diet intolerance such as nausea, vomiting, diarrhea, decreased urine output, weight loss, decreased appetite or decreased oral intake  - Any Questions or Concerns  ??  *Outpatient Follow-up Lab Plan:  Your next lab draw will be Tuesday November 17th, 2020. You should have labs drawn weekly at [facility] CBC with diff, CMP, GGT, Phos, Mg, INR, Tacrolimus trough level, direct bilirubin and CMV. You should get these labs drawn BEFORE your morning tacrolimus dose.   ??  Important Phone Numbers  [facility] Pediatric Gastroenterology / Liver Transplant: [phone]  [facility] Liver Transplant Coordinator: Jamey Reas: [phone]  [facility] Liver Transplant Office: [phone]   [facility] Dermatology: [phone]  - [facility] Endocrinology: [phone]   [facility] Operator: [phone] for you specialists on-call   ??  *If you are considering suicide, or if someone you know may  be planning to harm him or herself, immediately call 911 or 309-854-6143 (National Suicide Prevention Hotline). You can also text CONNECT to [phone] to connect with a free, confidential, 24 hour, trained crisis counselor*      ??             Future Appointments:  Appointments which have been scheduled for you    Sep 29, 2019  1:00 PM (Arrive by 12:30 PM)  PHONE with Lanelle Bal, RD/LDN  [facility] NUTRITION SERVICES TRANSPLANT [place] [facility] REGION) [address]  [place] [address]  [phone]   Please DO NOT come to the clinic for this visit. We will call you to discuss your plan of care.      Sep 29, 2019  2:30 PM  (Arrive by 2:00 PM)  PHONE with Elease Etienne  [facility] LIVER TRANSPLANT [place] [facility] REGION) [address]  [place] [address]  [phone]   Please DO NOT come to the clinic for this visit. We will call you to discuss your plan of care.      Sep 30, 2019  1:10 PM  (Arrive by 12:55 PM)  RETURN VIDEO - OTHER with Neill Loft, MD  [facility] DIABETES AND ENDOCRINOLOGY [place] [name] REGION) [address] [employer] [address]  [place] [address]  [phone]      Oct 03, 2019  8:30 AM  (Arrive by 8:00 AM)  NEW PFT 75 with [facility] PFT 2  [facility] PULMONARY FUNCTION [address] [employer] [place] [facility] REGION) [address]  [place] [address]  [phone]      Oct 03, 2019 10:10 AM  (Arrive by 9:40 AM)  LAB ONLY with LAB PHLEB GRND UNCW  LAB PHLEB GRND FLR [employer] [facility] REGION) [address]  [place] [address]  [phone]      Oct 03, 2019 10:30 AM  (Arrive by 10:00 AM)  XR DEXA BONE DENISTY EXREMITY with Doran Durand RM 1  IMG DEXA [facility] [address]  [place] [address]  [phone]      Oct 03, 2019  1:30 PM  (Arrive by 1:00 PM)  NEW 15 with Chirag Lanney Gins, MD  [facility] TRANSPLANT SURGERY [facility] REGION) [address]  [place] [address]  [phone]      Oct 03, 2019  3:00 PM  (Arrive by 2:30 PM)  RETURN  PSYCHOLOGY with Theda Sers, PhD  [facility] TRANSPLANT SURGERY [facility] REGION) [address]  [place] [address]  [phone]      Oct 04, 2019 10:00 AM  (Arrive by 9:30 AM)  PATCONSULT with Francia Greaves RM 03 NP  [facility] PRE PROCEDURE SERVICES [facility] REGION) [address]  [place] [address]  [phone]      Oct 04, 2019  1:00 PM  (Arrive by 12:30 PM)  RETURN  GENERAL with Glorianne Manchester, MD  [facility] CHILDRENS GASTROENTEROLOGY [place] [facility] REGION) [address]  [place] [address]  [phone]           Kayren Eaves, MD  [facility] Pediatrics, PGY-2        I saw and evaluated the patient, participating in the key portions of the service on the day of discharge.?? I reviewed the resident's note and agree with  the discharge plans and disposition. I was available and supervised the time spent by resident in work on day of discharge. Resident spent greater than 30 minutes in discharge planning services.     Pablo Ledger MD, MPH, MMSc.  Attending Physician; Peds GI/Hepatology/Nutrition  [facility]

## 2019-09-24 NOTE — Unmapped (Signed)
Pt was afebrile with VSS overnight. No reports of pain or nausea. Pt voiding well, icteric colored urine.  Changed to a regular diet but patient using low Na menu to make low sodium choices. Pt is looking at the menu and making good choices.  No family at bedside overnight. Plan to discharge today       Problem: Adult Inpatient Plan of Care  Goal: Plan of Care Review  Outcome: Ongoing - Unchanged  Goal: Patient-Specific Goal (Individualization)  Outcome: Ongoing - Unchanged  Goal: Absence of Hospital-Acquired Illness or Injury  Outcome: Ongoing - Unchanged  Goal: Optimal Comfort and Wellbeing  Outcome: Ongoing - Unchanged  Goal: Readiness for Transition of Care  Outcome: Ongoing - Unchanged  Goal: Rounds/Family Conference  Outcome: Ongoing - Unchanged     Problem: Self-Care Deficit  Goal: Improved Ability to Complete Activities of Daily Living  Outcome: Ongoing - Unchanged     Problem: Fall Injury Risk  Goal: Absence of Fall and Fall-Related Injury  Outcome: Ongoing - Unchanged     Problem: Wound  Goal: Optimal Wound Healing  Outcome: Ongoing - Unchanged     Problem: Adjustment to Illness (Liver Failure)  Goal: Optimal Coping with Liver Failure  Outcome: Ongoing - Unchanged     Problem: Neurologic Function Impaired (Liver Failure)  Goal: Optimal Neurologic Function  Outcome: Ongoing - Unchanged     Problem: Oral Intake Inadequate (Liver Failure)  Goal: Optimal Nutrition Intake  Outcome: Ongoing - Unchanged     Problem: Infection  Goal: Infection Symptom Resolution  Outcome: Ongoing - Unchanged

## 2019-09-24 NOTE — Unmapped (Signed)
Patient remained afebrile, VSS, on room air. No complaints of pain. Patient stated that he felt stronger with less tremors today. BG 127 prior to breakfast ad 183 prior to lunch, gave insulin as ordered. PIV c.d.I, SL at 1730. Per patient stools have become more formed. Voiding well. Mom at bedside and active in care. Will CTM and follow POC.    Problem: Adult Inpatient Plan of Care  Goal: Plan of Care Review  Outcome: Progressing  Goal: Patient-Specific Goal (Individualization)  Outcome: Progressing  Goal: Absence of Hospital-Acquired Illness or Injury  Outcome: Progressing  Goal: Optimal Comfort and Wellbeing  Outcome: Progressing  Goal: Readiness for Transition of Care  Outcome: Progressing  Goal: Rounds/Family Conference  Outcome: Progressing  Problem: Self-Care Deficit  Goal: Improved Ability to Complete Activities of Daily Living  Outcome: Progressing  Problem: Fall Injury Risk  Goal: Absence of Fall and Fall-Related Injury  Outcome: Progressing  Problem: Wound  Goal: Optimal Wound Healing  Outcome: Progressing  Problem: Adjustment to Illness (Liver Failure)  Goal: Optimal Coping with Liver Failure  Outcome: Progressing  Problem: Neurologic Function Impaired (Liver Failure)  Goal: Optimal Neurologic Function  Outcome: Progressing  Problem: Oral Intake Inadequate (Liver Failure)  Goal: Optimal Nutrition Intake  Outcome: Progressing  Problem: Infection  Goal: Infection Symptom Resolution  Outcome: Progressing

## 2019-09-24 NOTE — Unmapped (Signed)
Diabetes Care Team Virtual Care Note                  **This patient was not seen in person today. The Diabetes Care Team service has moved to a hybrid virtual model to minimize potential spread of COVID-19, protect patients/providers, reduced PPE utilization and deliver care to more patients.  During this time, we will be limiting person-to-person contact when necessary.**      Patients chart, including glucose trends, insulin doses, nutrition, labs and medications have been reviewed. Based on this review we recommend continuing current regimen for now. Please notify us of any changes to nutrition as this will affect glucose values and insulin requirement.    Time spent reviewing chart: 4 minutes      Please page with questions or concerns: Daivd Council, [place]: [phone] from 6AM - 3PM on weekdays or endocrine fellow on call: [phone] from 3PM - 6AM on weekdays or on weekends and holidays. If APP cannot be reached, please page the endocrine fellow on call.

## 2019-09-25 NOTE — Unmapped (Signed)
VSS. Remain on RA. PIV SL. Scheduled medicines were given. Drinking a lot and eating some. The team came by and spoke to the patient and address things that he should do at home such as eating, watching stools and grandmother to call MD if any change in mental status. Discharge instructions were discussed and given to the patient. Outpatient medications were picked up and given to the patient.   Problem: Adult Inpatient Plan of Care  Goal: Plan of Care Review  Outcome: Discharged to Home  Goal: Patient-Specific Goal (Individualization)  Outcome: Discharged to Home  Goal: Absence of Hospital-Acquired Illness or Injury  Outcome: Discharged to Home  Goal: Optimal Comfort and Wellbeing  Outcome: Discharged to Home  Goal: Readiness for Transition of Care  Outcome: Discharged to Home  Goal: Rounds/Family Conference  Outcome: Discharged to Home     Problem: Self-Care Deficit  Goal: Improved Ability to Complete Activities of Daily Living  Outcome: Discharged to Home     Problem: Fall Injury Risk  Goal: Absence of Fall and Fall-Related Injury  Outcome: Discharged to Home     Problem: Wound  Goal: Optimal Wound Healing  Outcome: Discharged to Home     Problem: Adjustment to Illness (Liver Failure)  Goal: Optimal Coping with Liver Failure  Outcome: Discharged to Home     Problem: Neurologic Function Impaired (Liver Failure)  Goal: Optimal Neurologic Function  Outcome: Discharged to Home     Problem: Oral Intake Inadequate (Liver Failure)  Goal: Optimal Nutrition Intake  Outcome: Discharged to Home     Problem: Infection  Goal: Infection Symptom Resolution  Outcome: Discharged to Home

## 2019-09-26 DIAGNOSIS — I82621 Acute embolism and thrombosis of deep veins of right upper extremity: Principal | ICD-10-CM

## 2019-09-26 NOTE — Unmapped (Signed)
I spoke with patient [name] Gross to confirm appointment     [place]

## 2019-09-26 NOTE — Unmapped (Signed)
Called mother [name] . Discussed outpatient f/u plans for mgmt of RUE DVT. Will plan for repeat imaging in 23mo with video visit f/u after imaging is complete. If clot is stable and/or improved at that time, likely will not need further follow up since patient is not on anticoagulation. If clot is growing on imaging will need to consider anticoagulation. Advised mother that our schedulers will get in touch with them for appts, Kazmir needs to be present during video visit. If Zacharee develops any pain, swelling or color changes to RUE in the interim, must notify clinic and/or seek urgent care. Mother verbalized understanding.

## 2019-09-27 ENCOUNTER — Other Ambulatory Visit (HOSPITAL_COMMUNITY)
Admission: RE | Admit: 2019-09-27 | Discharge: 2019-09-27 | Disposition: A | Discharge status: Home / Self Care | Payer: Medicaid Other | Source: Ambulatory Visit | Attending: Pediatric Gastroenterology | Admitting: Pediatric Gastroenterology

## 2019-09-27 DIAGNOSIS — Z79899 Other long term (current) drug therapy: Secondary | ICD-10-CM | POA: Diagnosis not present

## 2019-09-27 DIAGNOSIS — Z944 Liver transplant status: Secondary | ICD-10-CM | POA: Diagnosis not present

## 2019-09-27 LAB — CBC WITH DIFFERENTIAL/PLATELET
Abs Immature Granulocytes: 1.18 10*3/uL — ABNORMAL HIGH (ref 0.00–0.07)
Basophils Absolute: 0 10*3/uL (ref 0.0–0.1)
Basophils Relative: 0 %
Eosinophils Absolute: 0 10*3/uL (ref 0.0–0.5)
Eosinophils Relative: 0 %
HCT: 23.6 % — ABNORMAL LOW (ref 39.0–52.0)
Hemoglobin: 8.7 g/dL — ABNORMAL LOW (ref 13.0–17.0)
Immature Granulocytes: 8 %
Lymphocytes Relative: 5 %
Lymphs Abs: 0.8 10*3/uL (ref 0.7–4.0)
MCH: 29.6 pg (ref 26.0–34.0)
MCHC: 36.9 g/dL — ABNORMAL HIGH (ref 30.0–36.0)
MCV: 80.3 fL (ref 80.0–100.0)
Monocytes Absolute: 1.2 10*3/uL — ABNORMAL HIGH (ref 0.1–1.0)
Monocytes Relative: 8 %
Neutro Abs: 11.6 10*3/uL — ABNORMAL HIGH (ref 1.7–7.7)
Neutrophils Relative %: 79 %
Platelets: 207 10*3/uL (ref 150–400)
RBC: 2.94 MIL/uL — ABNORMAL LOW (ref 4.22–5.81)
RDW: 24 % — ABNORMAL HIGH (ref 11.5–15.5)
WBC: 14.8 10*3/uL — ABNORMAL HIGH (ref 4.0–10.5)
nRBC: 0.1 % (ref 0.0–0.2)

## 2019-09-27 LAB — COMPREHENSIVE METABOLIC PANEL
ALKALINE PHOSPHATASE: 1389 U/L — ABNORMAL HIGH
ALT: 181 U/L — ABNORMAL HIGH (ref 0–44)
AST (SGOT): 149 U/L — ABNORMAL HIGH
AST: 149 U/L — ABNORMAL HIGH (ref 15–41)
Albumin: 2.6 g/dL — ABNORMAL LOW (ref 3.5–5.0)
Alkaline Phosphatase: 1189 U/L — ABNORMAL HIGH (ref 38–126)
Anion gap: 8 (ref 5–15)
BILIRUBIN TOTAL: 38 mg/dL — CR
BLOOD UREA NITROGEN: 25 mg/dL — ABNORMAL HIGH
BUN: 25 mg/dL — ABNORMAL HIGH (ref 6–20)
CHLORIDE: 108 mmol/L
CO2: 16 mmol/L — ABNORMAL LOW (ref 22–32)
CO2: 16 mmol/L — ABNORMAL LOW
CREATININE: 1.53 mg/dL — ABNORMAL HIGH
Calcium: 9 mg/dL (ref 8.9–10.3)
Chloride: 108 mmol/L (ref 98–111)
Creatinine, Ser: 1.53 mg/dL — ABNORMAL HIGH (ref 0.61–1.24)
GFR calc Af Amer: >60 mL/min (ref >60)
GFR calc non Af Amer: >60 mL/min (ref >60)
GLUCOSE RANDOM: 119 mg/dL — ABNORMAL HIGH
Glucose, Bld: 119 mg/dL — ABNORMAL HIGH (ref 70–99)
POTASSIUM: 3.9 mmol/L
PROTEIN TOTAL: 5.9 g/dL — ABNORMAL LOW
Potassium: 3.9 mmol/L (ref 3.5–5.1)
SODIUM: 132 mmol/L — ABNORMAL LOW
Sodium: 132 mmol/L — ABNORMAL LOW (ref 135–145)
Total Bilirubin: 38 mg/dL (ref 0.3–1.2)
Total Protein: 5.9 g/dL — ABNORMAL LOW (ref 6.5–8.1)

## 2019-09-27 LAB — MAGNESIUM
Lab: 1.6 — ABNORMAL LOW
Magnesium: 1.6 mg/dL — ABNORMAL LOW (ref 1.7–2.4)

## 2019-09-27 LAB — PROTIME-INR
INR: 1.5 — ABNORMAL HIGH (ref 0.8–1.2)
Prothrombin Time: 17.6 seconds — ABNORMAL HIGH (ref 11.4–15.2)

## 2019-09-27 LAB — GAMMA GT: GGT: 662 U/L — ABNORMAL HIGH (ref 7–50)

## 2019-09-27 LAB — PHOSPHORUS
Lab: 4.8 — ABNORMAL HIGH
Phosphorus: 4.8 mg/dL — ABNORMAL HIGH (ref 2.5–4.6)

## 2019-09-27 LAB — CBC W/ DIFFERENTIAL
BASOPHILS ABSOLUTE COUNT: 0 10*9/L
EOSINOPHILS ABSOLUTE COUNT: 0 10*9/L
HEMATOCRIT: 23.6 % — ABNORMAL LOW
HEMOGLOBIN: 8.7 g/dL — ABNORMAL LOW
LYMPHOCYTES ABSOLUTE COUNT: 0.8 10*9/L
MONOCYTES ABSOLUTE COUNT: 1.2 10*9/L — ABNORMAL HIGH
PLATELET COUNT: 207 10*9/L
WHITE BLOOD CELL COUNT: 14.8 10*9/L — ABNORMAL HIGH

## 2019-09-27 LAB — ALBUMIN: Lab: 2.6 — ABNORMAL LOW

## 2019-09-27 LAB — EGFR CKD-EPI NON-AA FEMALE: Lab: 0

## 2019-09-27 LAB — GAMMA GLUTAMYL TRANSFERASE: Lab: 662 — ABNORMAL HIGH

## 2019-09-27 LAB — MEAN CORPUSCULAR HEMOGLOBIN: Lab: 0

## 2019-09-27 LAB — INR: Lab: 1.5 — ABNORMAL HIGH

## 2019-09-29 ENCOUNTER — Encounter: Admit: 2019-09-29 | Discharge: 2019-09-30 | Payer: PRIVATE HEALTH INSURANCE

## 2019-09-29 ENCOUNTER — Encounter
Admit: 2019-09-29 | Discharge: 2019-09-30 | Payer: PRIVATE HEALTH INSURANCE | Attending: Nutritionist | Primary: Nutritionist

## 2019-09-29 DIAGNOSIS — Z944 Liver transplant status: Principal | ICD-10-CM

## 2019-09-29 DIAGNOSIS — Z79899 Other long term (current) drug therapy: Principal | ICD-10-CM

## 2019-09-29 DIAGNOSIS — T8641 Liver transplant rejection: Principal | ICD-10-CM

## 2019-09-29 DIAGNOSIS — Z01818 Encounter for other preprocedural examination: Principal | ICD-10-CM

## 2019-09-29 LAB — TACROLIMUS LEVEL: Tacrolimus (FK506) - LabCorp: 6.6 ng/mL (ref 2.0–20.0)

## 2019-09-29 LAB — PATHOLOGIST SMEAR REVIEW

## 2019-09-29 LAB — TACROLIMUS, TROUGH: Lab: 6.6

## 2019-09-29 MED ORDER — RIFAXIMIN 550 MG TABLET
ORAL_TABLET | Freq: Two times a day (BID) | ORAL | 4 refills | 30 days | Status: CP
Start: 2019-09-29 — End: ?
  Filled 2019-10-11: qty 60, 30d supply, fill #0

## 2019-09-29 MED ORDER — VALGANCICLOVIR 450 MG TABLET
ORAL_TABLET | Freq: Every day | ORAL | 4 refills | 30.00000 days | Status: CP
Start: 2019-09-29 — End: 2019-10-29
  Filled 2019-10-11: qty 30, 30d supply, fill #0

## 2019-09-29 NOTE — Unmapped (Signed)
Recent:   What is the date of your last related visit?  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 MILD swelling of both ankles (i.e., pedal edema) AND new onset or worsening    Answer Assessment - Initial Assessment Questions  1. ONSET: When did the swelling start? (e.g., minutes, hours, days)      Bilateral ankles to feet started on 09/22/19 - getting worse  2. LOCATION: What part of the leg is swollen?  Are both legs swollen or just one leg?      Both - see above   3. SEVERITY: How bad is the swelling? (e.g., localized; mild, moderate, severe)   - Localized - small area of swelling localized to one leg   - MILD pedal edema - swelling limited to foot and ankle, pitting edema < 1/4 inch (6 mm) deep, rest and elevation eliminate most or all swelling   - MODERATE edema - swelling of lower leg to knee, pitting edema > 1/4 inch (6 mm) deep, rest and elevation only partially reduce swelling   - SEVERE edema - swelling extends above knee, facial or hand swelling present      Mild   4. REDNESS: Does the swelling look red or infected?      Not red but is tight  5. PAIN: Is the swelling painful to touch? If so, ask: How painful is it?   (Scale 1-10; mild, moderate or severe)      No pain when touches it  6. FEVER: Do you have a fever? If so, ask: What is it, how was it measured, and when did it start?       No   7. CAUSE: What do you think is causing the leg swelling?      Liver failure   8. MEDICAL HISTORY: Do you have a history of heart failure, kidney disease, liver failure, or cancer?      H/o liver transplant, recent liver transplant failure   9. RECURRENT SYMPTOM: Have you had leg swelling before? If so, ask: When was the last time? What happened that time?      No 10. OTHER SYMPTOMS: Do you have any other symptoms? (e.g., chest pain, difficulty breathing)       Also has abdominal swelling, same as when discharged from hospital  11. PREGNANCY: Is there any chance you are pregnant? When was your last menstrual period?        N/A    Protocols used: LEG SWELLING AND EDEMA-ADULT-OH

## 2019-09-29 NOTE — Unmapped (Signed)
The following meds are to be onboarded in this call:  1. Valcyte  2. Xifaxan    Other meds that have been previously onboarded (on 08/03/2019)  1. Prograf  2. Myfortic    [facility]   Patient Onboarding/Medication Counseling    [name] Gross is a 18 y.o. male with liver transplant who I am counseling today on continuation of therapy.  I am speaking to the patient's caregiver. Spoke to [name] Gross's mom today about his medicine.    Verified patient's date of birth / HIPAA.    Specialty medication(s) to be sent: Transplant:  mycophenolic acid 180mg , valgancyclovir 450mg , Prednisone 20mg  and Xifaxan 550mg          Non-specialty medications/supplies to be sent: Smz-Tmp, Accuchek guide test strips,Pantoprazole, ADEK,       Medications not needed at this time: Tacrolimus         Xifaxan (rifaximin) 550mg  tablets    Medication & Administration     Dosage: take 1 tablet (550mg ) by mouth twice daily    Administration: Take with or without food.    Adherence/Missed dose instructions:   ? Take missed dose as soon as you remember. If it is close to the time of your next dose, skip the missed dose and resume with your next scheduled dose.  ? Do not take extra doses or 2 doses at the same time.    Goals of Therapy     Hepatic Encephalopathy: The goal is to reduce risk of overt hepatic encephalopathy recurrence.    Side Effects & Monitoring Parameters     Common Side Effects:   ? Peripheral edema  ? Nausea  ? Dizziness  ? Fatigue  ? Ascites    The following side effects should be reported to the provider:  ?? Signs of an allergic reaction, such as rash; hives; itching; red, swollen, blistered, or peeling skin with or without fever. If you have wheezing; tightness in the chest or throat; trouble breathing, swallowing, or talking; unusual hoarseness; or swelling of the mouth, face, lips, tongue, or throat, call 911 or go to the closest emergency department (ED).   ?? Swelling in the arms, legs or stomach. ?? Feeling very tired or weak.  ?? Low mood (depression).   ?? Fever.   ?? Diarrhea is common with antibiotics. Rarely, a severe form called C diff???associated diarrhea (CDAD) may happen. Sometimes this has led to a deadly bowel problem (colitis). CDAD may happen during or a few months after taking antibiotics. Call your doctor right away if you have stomach pain, cramps, or very loose, watery, or bloody stools. Check with your doctor before treating diarrhea.    Monitoring Parameters:   ? For the prevention of hepatic encephalopathy:  Patient should monitor for changes in mental status.   ? For IBS-D: Monitor for improvement in symptoms such as a decrease in diarrhea.      Contraindications, Warnings, & Precautions     ? Superinfection: Prolonged use may result in fungal or bacterial superinfection, including Clostridioides (formerly Clostridium) difficile-associated diarrhea (CDAD) and pseudomembranous colitis; CDAD has been observed >2 months post-antibiotic treatment.  ? Severe (Child Pugh Class C) Hepatic Impairment: increased systemic exposure with severe hepatic impairment.  ? Concomitant use with P-glycoprotein (P-gp) inhibitors: P-gp inhibitors may increase systemic exposure of rifaximin.    Drug/Food Interactions     ? Medication list reviewed in Epic. The patient was instructed to inform the care team before taking any new medications or supplements.  no interactions noted that clinic is not already monitoring.   ? Warfarin: monitor INR and prothrombin time; Dose adjustment of warfarin may be needed to maintain target INR range.    Storage, Handling Precautions, & Disposal     ? Store this medication at room temperature.  ? Store in a dry place. Do not store in a bathroom.   ? Keep all drugs out of the reach of children and pets. ? Throw away unused or expired drugs. Do not flush down a toilet or pour down a drain unless you are told to do so. Check with your pharmacist if you have questions about the best way to throw out drugs. There may be drug take-back programs in your area.    Valcyte (valganciclovir)    Medication & Administration     Dosage:   ? Take 1 tablet (450mg ) by mouth daily    Administration:   ? Take with food  ? Swallow the pills whole, do not break, crush, or chew    Adherence/Missed dose instructions:  ? Take a missed dose as soon as you think about it with food  ? If it is close to your next dose, skip the missed dose and go back to your normal time.  ? Do not take 2 doses at the same time or extra doses.  ? Report any missed doses to coordinator    Goals of Therapy     ? To prevent or treat CMV infection in setting of solid organ transplant    Side Effects & Monitoring Parameters     ? Common side effects  ? Headache  ? Diarrhea or constipation  ? Appetite or sleep disturbances  ? Back, muscle, joint, or belly pain  ? Weight loss  ? Dizziness  ? Muscle spasm  ? Upset stomach or vomiting    ? The following side effects should be reported to the provider:  ? Allergic reaction  (rash, hives, swelling, blistered or peeling skin, shortness of breath)  ? Infection (fever, chills, sore throat, ear/sinus pain, cough, sputum change, urinary pain, mouth sores, non-healing wounds)  ? Bleeding (cough ground vomit, blood in urine, black/red/tarry stools, unexplained bruising or bleeding)  ? Electrolyte problems (mood changes, confusion, weakness, abnormal heartbeat, seizures)  ? Kidney problems (urine changes, weight gain)  ? Yellowing skin or eyes  ? Swelling in arms, legs, stomach  ? Severe dizziness or passing out  ? Eye issues (eyesight changes, pain, or irritation)  ? Night sweats    ? Monitoring parameters  ? Have eye exam as directed by doctor  ? CMV counts  ? CBC  ? Renal function  ? Pregnancy test prior to initiation Contraindications, Warnings, & Precautions     ? BBW: severe leukopenia, neutropenia, anemia, thrombocytopenia, pancytopenia, and bone marrow failure, including aplastic anemia have been reported  ? BBW: may cause temporary or permanent inhibition of spermatogenesis and suppression of fertilty; has the potential to cause birth defects and cancers in humans  ? Male patients should have pregnancy test prior to initiation and use birth control for at least 30 days after discontinuation  ? Male patients should use a barrier contraceptive while on therapy and for 90 days after discontinuation  ? Acute renal failure  ? Not indicated for use in liver transplant recipients  ? Breastfeeding is not recommended    Drug/Food Interactions     ? Medication list reviewed in Epic. The patient was instructed to inform the care team  before taking any new medications or supplements. no interactions noted that clinic is not already monitoring.   ? Check with your doctor before getting any vaccinations (live or inactivated)    Storage, Handling Precautions, & Disposal     ? Store at room temperature  ? Keep away from children and pets      Current Medications (including OTC/herbals), Comorbidities and Allergies     Current Outpatient Medications   Medication Sig Dispense Refill   ??? blood sugar diagnostic Strp Use to check blood sugar four times daily as directed. 100 strip 12   ??? blood-glucose meter kit Use as instructed 1 each 0   ??? calcium carbonate (OS-CAL) 1,500 mg (600 mg elem calcium) tablet Take 1/2 tablet (300 mg elem calcium) by mouth Two (2) times a day. (Over the counter med) 60 tablet 0   ??? cholecalciferol, vitamin D3, 125 mcg (5,000 unit) tablet Take 1 tablet (5,000 Units total) by mouth daily. 30 tablet 11   ??? clindamycin-benzoyl peroxide 1.2 %(1 % base) -5 % gel Apply to rash on chest (Patient taking differently: Apply 1 application topically nightly. Apply to rash on chest) 45 g 0 ??? glucagon 0.5 mg/0.1 mL Syrg Inject 0.1 mL under the skin once as needed for up to 1 dose. 0.2 mL 2   ??? glucose 4 GM chewable tablet Chew 1 tablet (4 g total) 4 (four) times a day as needed for low blood sugar (refer to hypoglycemia guideline). 50 tablet 12   ??? insulin glargine (LANTUS) 100 unit/mL injection Inject 0.05 mL (5 Units total) under the skin nightly. 10 mL 0   ??? insulin lispro (HUMALOG) 100 unit/mL injection Inject 0-0.05 mL (0-5 Units total) under the skin Four (4) times a day (before meals and nightly). 10 mL 0   ??? insulin lispro (HUMALOG) 100 unit/mL injection Inject 0.02-0.04 mL (2-4 Units total) under the skin Four (4) times a day (before meals and nightly). 10 mL 0   ??? insulin syr/ndl U100 half mark (BD INSULIN SYRINGE HALF UNIT) 0.3 mL 31 gauge x 5/16 Syrg Use to inject insulins up to 6 times daily as directed. 200 each 12   ??? ketoconazole (NIZORAL) 2 % shampoo Apply topically daily as directed. 120 mL 0   ??? lactulose (CHRONULAC) 10 gram/15 mL solution Take 30 mL (20 g total) by mouth daily as needed (for constipation). 240 mL 0   ??? lancets Misc Use to check blood glucose 6 times per day. 204 each 0   ??? magnesium oxide (MAG-OX) 400 mg (241.3 mg magnesium) tablet Take 1 tablet (400 mg total) by mouth daily. (Patient taking differently: Take 400 mg by mouth Two (2) times a day. ) 30 tablet 3   ??? melatonin 3 mg Tab Take 1 tablet (3 mg total) by mouth every evening. 60 tablet 0   ??? multivitamin, with zinc (AQUADEKS) 100-5 mcg-mg Chew Chew 2 tablets daily. 60 tablet 0   ??? mycophenolate (MYFORTIC) 180 MG EC tablet Take 3 tablets (540 mg total) by mouth Two (2) times a day. 180 tablet 11   ??? nystatin (MYCOSTATIN) 100,000 unit/mL suspension Take 10 mls (1,000,000 Units total) by mouth Three (3) times a day. 210 mL 0   ??? ondansetron (ZOFRAN-ODT) 4 MG disintegrating tablet Take 1 tablet (4 mg total) by mouth every eight (8) hours as needed for up to 7 days. 10 tablet 0 ??? pantoprazole (PROTONIX) 40 MG tablet Take 1 tablet (40 mg total) by mouth  daily. 30 tablet 0   ??? potassium & sodium phosphates 250mg  (PHOS-NAK/NEUTRA PHOS) 280-160-250 mg PwPk Take 2 packets by mouth Three (3) times a day. 180 packet 0   ??? predniSONE (DELTASONE) 20 MG tablet Take 2 tablets (40 mg total) by mouth daily. 60 tablet 0   ??? rifAXIMin (XIFAXAN) 550 mg Tab Take 1 tablet (550 mg total) by mouth Two (2) times a day. 60 tablet 0   ??? sulfamethoxazole-trimethoprim (BACTRIM) 400-80 mg per tablet Take 1 tablet (80 mg of trimethoprim total) by mouth Every Monday, Wednesday, and Friday. 12 tablet 0   ??? tacrolimus (PROGRAF) 1 MG capsule Take 2 capsules (2 mg total) by mouth two (2) times a day. 120 capsule 0   ??? valGANciclovir (VALCYTE) 450 mg tablet Take 1 tablet (450 mg total) by mouth daily. 30 tablet 0     No current facility-administered medications for this visit.        Allergies   Allergen Reactions   ??? Bee Pollen Rash     Pt has seasonal allergies that cause excessive sneezing and running nose- [name], NAII   ??? Pollen Extracts Rash     Pt has seasonal allergies that cause excessive sneezing and running noseCardell Peach       Patient Active Problem List   Diagnosis   ??? Adjustment disorder with anxious mood   ??? Recurrent major depressive disorder, in partial remission (CMS-HCC)   ??? Malnutrition of mild degree (CMS-HCC)   ??? History of liver transplant (CMS-HCC)   ??? Allergic rhinitis   ??? Transaminitis   ??? History of suicide attempt   ??? 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 Acute rejection of liver transplant (CMS-HCC)   ??? Ingestion of multiple medications   ??? Liver transplant rejection (CMS-HCC)   ??? Normocytic anemia   ??? AKI (acute kidney injury) (CMS-HCC)   ??? Electrolyte disturbance   ??? Hypoalbuminemia   ??? H/O liver transplant (CMS-HCC)   ??? 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 High serum gamma glutamyl transferase (GGT)   ??? Steroid-induced hyperglycemia   ??? Pityrosporum folliculitis   ??? Bilateral knee swelling   ??? Homeless   ??? Thrombocytopenia (CMS-HCC)   ??? Hypomagnesemia   ??? Hypophosphatasia   ??? Other ascites   ??? Hyperbilirubinemia   ??? Impetigo lesions of Right Ear and Posterior Neck   ??? Steroid-induced acne   ??? 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 Irritant contact dermatitis of the hands   ??? Sleep difficulties   ??? Fever       Reviewed and up to date in Epic.    Appropriateness of Therapy     Is medication and dose appropriate based on diagnosis? Yes    Baseline Quality of Life Assessment      How many days over the past month did your liver transplant keep you from your normal activities? 0    Financial Information     Medication Assistance provided: None Required    Anticipated copay of $0 on each med reviewed with patient. Verified delivery address.    Delivery Information     Scheduled delivery date: 10/12/2019    Expected start date: 10/13/2019    Medication will be delivered via UPS to the prescription address in [facility].  This shipment will not require a signature.      Explained the services we provide at [facility] and that each month we would call to set up refills.  Stressed importance of returning phone calls so that we could ensure they receive their medications in time each month.  Informed patient that we should be setting up refills 7-10 days prior to when they will run out of medication.  A pharmacist will reach out to perform a clinical assessment periodically.  Informed patient that a welcome packet and a drug information handout will be sent. Patient verbalized understanding of the above information as well as how to contact the pharmacy at [phone] option 4 with any questions/concerns.  The pharmacy is open Monday through Friday 8:30am-4:30pm.  A pharmacist is available 24/7 via pager to answer any clinical questions they may have.    Patient Specific Needs     ? Does the patient have any physical, cognitive, or cultural barriers? No    ? Patient prefers to have medications discussed with  Patient     ? Is the patient able to read and understand education materials at a high school level or above? Yes    ? Patient's primary language is  English     ? Is the patient high risk? Yes, patient taking a REMS drug     ? Does the patient require a Care Management Plan? No     ? Does the patient require physician intervention or other additional services (i.e. nutrition, smoking cessation, social work)? No      Thad Ranger  [facility] Specialty Pharmacist

## 2019-09-29 NOTE — Unmapped (Signed)
Spoke with pt over the phone. Went over allergies and medications, made changes per patient. Pt has an Accu Chek Guide meter. Pt requested video visit be changed to a telephone visit instead.      Pt was in the hospital on 11/8 until 11/14 so only have blood sugars available starting from the evening on 11/14:    11/14:  Evening - 196    11/15:  Bkfst - 98  Lunch - 210  Evening - 136    11/16:  Bkfst - 103  Lunch - 191  Evening - 242    11/17:  Bkfst - 125  Lunch - 135  Evening - 213    11/18:  Bkfst - 112  Lunch - 176  Evening - 244    11/19:  Bkfst - 130  Lunch - 148

## 2019-09-29 NOTE — Unmapped (Signed)
Received call from pt mother that [name] Gross has increased swelling in bilateral feet/ankles. She said it was 2-3+ pitting edema. No redness, not hot. He also has ascites and jaundice which is his baseline.     She is most concerned with his feet and they aren't painful but are bothersome to him when he walks.   They also had labs done yesterday at [facility].     I will notify Dr. Melrose Nakayama and transplant team and follow with her with recommendations.    Pt mother verbalizes understanding and is appreciative of phone call; no other questions at this time.

## 2019-09-29 NOTE — Unmapped (Signed)
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 Adjustment disorder with anxious mood   ??? Recurrent major depressive disorder, in partial remission (CMS-HCC)   ??? Malnutrition of mild degree (CMS-HCC)   ??? History of liver transplant (CMS-HCC)   ??? Allergic rhinitis   ??? Transaminitis   ??? History of suicide attempt   ??? 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 Acute rejection of liver transplant (CMS-HCC)   ??? Ingestion of multiple medications   ??? Liver transplant rejection (CMS-HCC)   ??? Normocytic anemia   ??? AKI (acute kidney injury) (CMS-HCC)   ??? Electrolyte disturbance   ??? Hypoalbuminemia   ??? H/O liver transplant (CMS-HCC)   ??? 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 High serum gamma glutamyl transferase (GGT)   ??? Steroid-induced hyperglycemia   ??? Pityrosporum folliculitis   ??? Bilateral knee swelling   ??? Homeless   ??? Thrombocytopenia (CMS-HCC)   ??? Hypomagnesemia   ??? Hypophosphatasia   ??? Other ascites   ??? Hyperbilirubinemia   ??? Impetigo lesions of Right Ear and Posterior Neck   ??? Steroid-induced acne   ??? 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 Irritant contact dermatitis of the hands   ??? Sleep difficulties   ??? Fever       Anthropometrics:   Anthropometrics:  Estimated body mass index is 18.27 kg/m?? as calculated from the following:    Height as of 09/21/19: 172.7 cm (5' 8).    Weight as of 09/24/19: 54.5 kg (120 lb 2.4 oz).  IBW: 70 kg for 50th percentile   %IBW: 78%  Goal: weight gain towards IBW    Weight History: Pt reports that his usual weight is ~115 lbs per pt, he had fluid accumulation   Wt Readings from Last 6 Encounters:   09/24/19 54.5 kg (120 lb 2.4 oz) (4 %, Z= -1.76)*   09/13/19 53.8 kg (118 lb 9.6 oz) (3 %, Z= -1.86)*   09/07/19 56.1 kg (123 lb 10.9 oz) (6 %, Z= -1.53)*   01/21/19 54.1 kg (119 lb 4.3 oz) (4 %, Z= -1.71)*   10/13/18 58.5 kg (129 lb) (15 %, Z= -1.05)*   08/11/18 62.2 kg (137 lb 3.2 oz) (28 %, Z= -0.58)*     * Growth percentiles are based on CDC (Boys, 2-20 Years) data.     [facility] Marita Snellen Index: .Visit completed via phone/video and therefore frailty assessment unable to be completed.    Nutrition-Focused Physical Findings:   Visit completed via phone/video and therefore physical assessment unable to be completed.    Relevant Medications, Herbs, Supplements include:   Reviewed all nutritionally relevant medications and supplements  Calcium carbonae, Vitamin D3, humalog + lantus, lactulose, magnesium oxide, AquADEK, Zofran, potassium & sodium phosphates, prednisone, rifaximin    Relevant Labs:  Mg 1.6, PO4 4.8  HbA1c = 5.0     Pt reports his BG is usually ~90- 130 in the AM, and it increases in the afternoon and after dinner. He checks 4x per day      Physical Activity: No issues with exercise. He reports that he was sent home with exercises from PT.     Dietary Restrictions, Intolerances: none  per pt      Allergies:   Allergies   Allergen Reactions   ??? Bee Pollen Rash     Pt has seasonal allergies that cause excessive sneezing and running nose- [name], NAII   ??? Pollen Extracts Rash     Pt has seasonal allergies that cause excessive sneezing and running nose- Jessie, NAII       Hunger and Satiety: Up a little bit with steroids, usually consistently fair     24-Hour recall/usual intake:   1st - eggs and rice or bacon or bagel with cream cheese   Snack - none  2nd - oven baked wings or grilled chicken wrap   Snack - none   3rd - Welch's fruit snacks or baked Lay's  Snack - none   Beverages - water, cranberry juice and Gatorade     Other Usual Intake: usually avoids snacks during the day; has been drinking the Ensure Clear he received at the hospital but is running low    Nutrition History: He denies n/v/d/c. Pt reports he was prescribed a yellow protein powder in the past to aid in weight gain, but this did not help at the time.     Estimated Needs:   Estimated Energy Needs:  2475 kcal/day (EER + activity factor)    Estimated Protein Needs:  65- 80 g protein/day  (1.2- 1.5 g/kg)    Estimated Fluid Needs:  per MD    Nutrition Assessment: Per diet recall, pt with inadequate PO intake to meet estimated needs. Pt with baseline inadequate PO intake, and per pt his appetite and intake are at baseline. Pt reports enjoying Ensure Clear supplements while in the hospital, but he has not been able to find them in the stores. He dislikes the chocolate and vanilla flavored supplements. Pt was amenable to working on lean body weight gain. Pt also with diabetes, and he checks his BG levels are recommended.     Nutrition Assessment for Transplant: The patient has the following nutrition concerns for transplant listing: low weight.     Malnutrition Assessment using AND/ASPEN Clinical Characteristics:  -2 to -2.9, indicating MODERATE protein-calorie malnutrition    Patient meets criteria for MODERATE protein- calorie malnutrition     Nutrition Education: transplant goals and expectations, post transplant diet guidelines, weight gain tips     Materials Provided were: Sent Ensure coupon via MyChart message     Nutrition Goals:   1. Meet estimated daily needs.  2. Normal vitamin levels  3. Balanced macronutrient intake  4. Basic understanding of post-transplant diet  5. Hemoglobin A1c <7%    Interventions:  - Continue high protein, low sodium diet   - Increase overall intake at meal times  - Include 1 protein source at each meal   - Add 1 protein shake per day if solid food intake does not improve  - Increase exercise to 150 minutes per week      Follow-up: with yearly follow up, or sooner pr MD/coordinator discretion       I am located off-site and the patient is located off-site for this visit.      I spent 26 minutes on the phone with the patient. I spent an additional 28 minutes on pre- and post-visit activities.     The patient was physically located in [place] or a state in which I am permitted to provide care. The patient and/or parent/guardian understood that s/he may incur co-pays and cost sharing, and agreed to the  telemedicine visit. The visit was reasonable and appropriate under the circumstances given the patient's presentation at the time.    The patient and/or parent/guardian has been advised of the potential risks and limitations of this mode of treatment (including, but not limited to, the absence of in-person examination) and has agreed to be treated using telemedicine. The patient's/patient's family's questions regarding telemedicine have been answered.     If the visit was completed in an ambulatory setting, the patient and/or parent/guardian has also been advised to contact their provider???s office for worsening conditions, and seek emergency medical treatment and/or call 911 if the patient deems either necessary.      Lanelle Bal, RD, LDN  Abdominal Transplant Dietitian   Pager: [phone]

## 2019-09-29 NOTE — Unmapped (Signed)
[facility] SSC Specialty Medication Onboarding    Specialty Medication: Valganciclovir  Prior Authorization: Not Required   Financial Assistance: No - copay  <$25  Final Copay/Day Supply: 0 / 30    Insurance Restrictions: None     Notes to Pharmacist: N/A    The triage team has completed the benefits investigation and has determined that the patient is able to fill this medication at [facility]. Please contact the patient to complete the onboarding or follow up with the prescribing physician as needed.

## 2019-09-29 NOTE — Unmapped (Signed)
Xifaxan is refill too soon until 09/30/2019. Will retest 09/30/2019 and send on-board if successful claim.

## 2019-09-30 ENCOUNTER — Encounter: Admit: 2019-09-30 | Discharge: 2019-09-30 | Payer: PRIVATE HEALTH INSURANCE

## 2019-09-30 ENCOUNTER — Encounter
Admit: 2019-09-30 | Discharge: 2019-09-30 | Payer: PRIVATE HEALTH INSURANCE | Attending: Internal Medicine | Primary: Internal Medicine

## 2019-09-30 DIAGNOSIS — Z79899 Other long term (current) drug therapy: Principal | ICD-10-CM

## 2019-09-30 DIAGNOSIS — Z944 Liver transplant status: Principal | ICD-10-CM

## 2019-09-30 DIAGNOSIS — Z01818 Encounter for other preprocedural examination: Principal | ICD-10-CM

## 2019-09-30 DIAGNOSIS — T8691 Unspecified transplanted organ and tissue rejection: Principal | ICD-10-CM

## 2019-09-30 DIAGNOSIS — I82621 Acute embolism and thrombosis of deep veins of right upper extremity: Principal | ICD-10-CM

## 2019-09-30 LAB — LIPID PANEL
CHOLESTEROL: 159 mg/dL (ref 100–199)
VLDL CHOLESTEROL CAL: 15.8 mg/dL (ref 9–40)

## 2019-09-30 LAB — CBC W/ AUTO DIFF
BASOPHILS ABSOLUTE COUNT: 0 10*9/L (ref 0.0–0.1)
BASOPHILS RELATIVE PERCENT: 0.3 %
EOSINOPHILS ABSOLUTE COUNT: 0 10*9/L (ref 0.0–0.4)
EOSINOPHILS RELATIVE PERCENT: 0.1 %
HEMATOCRIT: 26.5 % — ABNORMAL LOW (ref 41.0–53.0)
HEMOGLOBIN: 8.7 g/dL — ABNORMAL LOW (ref 13.5–17.5)
LARGE UNSTAINED CELLS: 1 % (ref 0–4)
LYMPHOCYTES ABSOLUTE COUNT: 0.7 10*9/L — ABNORMAL LOW (ref 1.5–5.0)
LYMPHOCYTES RELATIVE PERCENT: 4.2 %
MEAN CORPUSCULAR HEMOGLOBIN CONC: 32.7 g/dL (ref 31.0–37.0)
MEAN CORPUSCULAR HEMOGLOBIN: 29.9 pg (ref 26.0–34.0)
MEAN CORPUSCULAR VOLUME: 91.4 fL (ref 80.0–100.0)
MEAN PLATELET VOLUME: 7.3 fL (ref 7.0–10.0)
MONOCYTES ABSOLUTE COUNT: 0.7 10*9/L (ref 0.2–0.8)
MONOCYTES RELATIVE PERCENT: 4.4 %
NEUTROPHILS ABSOLUTE COUNT: 14.3 10*9/L — ABNORMAL HIGH (ref 2.0–7.5)
NEUTROPHILS RELATIVE PERCENT: 90.1 %
PLATELET COUNT: 176 10*9/L (ref 150–440)
RED BLOOD CELL COUNT: 2.9 10*12/L — ABNORMAL LOW (ref 4.50–5.90)
RED CELL DISTRIBUTION WIDTH: 25 % — ABNORMAL HIGH (ref 12.0–15.0)
WBC ADJUSTED: 15.9 10*9/L — ABNORMAL HIGH (ref 4.5–11.0)

## 2019-09-30 LAB — GAMMAGLOBULIN; IGA: IgA:MCnc:Pt:Ser/Plas:Qn:: 69.4

## 2019-09-30 LAB — COMPREHENSIVE METABOLIC PANEL
ALBUMIN: 3.2 g/dL — ABNORMAL LOW (ref 3.5–5.0)
ALKALINE PHOSPHATASE: 1241 U/L — ABNORMAL HIGH (ref 65–260)
AST (SGOT): 226 U/L — ABNORMAL HIGH (ref 19–55)
BILIRUBIN TOTAL: 34.5 mg/dL — ABNORMAL HIGH (ref 0.0–1.2)
BLOOD UREA NITROGEN: 32 mg/dL — ABNORMAL HIGH (ref 7–21)
BUN / CREAT RATIO: 26
CALCIUM: 9 mg/dL (ref 8.5–10.2)
CREATININE: 1.21 mg/dL (ref 0.70–1.30)
EGFR CKD-EPI AA MALE: >90 mL/min/{1.73_m2} (ref >=60)
EGFR CKD-EPI NON-AA MALE: 86 mL/min/{1.73_m2} (ref >=60)
GLUCOSE RANDOM: 108 mg/dL (ref 70–179)
POTASSIUM: 4.4 mmol/L (ref 3.5–5.0)
SODIUM: 137 mmol/L (ref 135–145)

## 2019-09-30 LAB — LIPASE: Triacylglycerol lipase:CCnc:Pt:Ser/Plas:Qn:: 389 — ABNORMAL HIGH

## 2019-09-30 LAB — IRON & TIBC: IRON SATURATION (CALC): 81 % — ABNORMAL HIGH (ref 20–50)

## 2019-09-30 LAB — SLIDE REVIEW

## 2019-09-30 LAB — IRON SATURATION (CALC): Iron saturation:MFr:Pt:Ser/Plas:Qn:: 81 — ABNORMAL HIGH

## 2019-09-30 LAB — PLATELET COUNT: Platelets:NCnc:Pt:Bld:Qn:Automated count: 176

## 2019-09-30 LAB — PROTIME-INR: INR: 1.33

## 2019-09-30 LAB — TOXICOLOGY SCREEN, URINE
AMPHETAMINE SCREEN URINE: <500
BENZODIAZEPINE SCREEN, URINE: <200
CANNABINOID SCREEN URINE: <20
METHADONE SCREEN, URINE: <300
OPIATE SCREEN URINE: <300

## 2019-09-30 LAB — POTASSIUM: Potassium:SCnc:Pt:Ser/Plas:Qn:: 4.4

## 2019-09-30 LAB — APTT
APTT: 27.4 s (ref 25.3–37.1)
Coagulation surface induced:Time:Pt:PPP:Qn:Coag: 27.4

## 2019-09-30 LAB — GAMMAGLOBULIN; IGG: IgG:MCnc:Pt:Ser/Plas:Qn:: 1122

## 2019-09-30 LAB — PHOSPHORUS: Phosphate:MCnc:Pt:Ser/Plas:Qn:: 3.4

## 2019-09-30 LAB — GAMMA GLUTAMYL TRANSFERASE: Gamma glutamyl transferase:CCnc:Pt:Ser/Plas:Qn:: 518 — ABNORMAL HIGH

## 2019-09-30 LAB — MAGNESIUM: Magnesium:MCnc:Pt:Ser/Plas:Qn:: 1.4 — ABNORMAL LOW

## 2019-09-30 LAB — TRIGLYCERIDES: Triglyceride:MCnc:Pt:Ser/Plas:Qn:: 79

## 2019-09-30 LAB — GAMMAGLOBULIN; IGM: IgM:MCnc:Pt:Ser/Plas:Qn:: 130

## 2019-09-30 LAB — INR: Coagulation tissue factor induced.INR:RelTime:Pt:PPP:Qn:Coag: 1.33

## 2019-09-30 LAB — SMEAR REVIEW

## 2019-09-30 LAB — BILIRUBIN DIRECT: Bilirubin.glucuronidated+Bilirubin.albumin bound:MCnc:Pt:Ser/Plas:Qn:: 31.9 — ABNORMAL HIGH

## 2019-09-30 LAB — BENZODIAZEPINE SCREEN, URINE: Lab: <200

## 2019-09-30 LAB — TACROLIMUS BLOOD: Lab: 4.2

## 2019-09-30 LAB — HLA ANTIBODY SCR

## 2019-09-30 NOTE — Unmapped (Signed)
I spent 20 minutes on telephone visit. I spent an additional 15 minutes on pre- and post-visit activities.     The patient was not located and I was located within 250 yards of a hospital based location during the audio and video visit. The patient was physically located in [place] or a state in which I am permitted to provide care. The patient and/or parent/guardian understood that s/he may incur co-pays and cost sharing, and agreed to the telemedicine visit. The visit was reasonable and appropriate under the circumstances given the patient's presentation at the time.    The patient and/or parent/guardian has been advised of the potential risks and limitations of this mode of treatment (including, but not limited to, the absence of in-person examination) and has agreed to be treated using telemedicine. The patient's/patient's family's questions regarding telemedicine have been answered.    If the visit was completed in an ambulatory setting, the patient and/or parent/guardian has also been advised to contact their provider???s office for worsening conditions, and seek emergency medical treatment and/or call 911 if the patient deems either necessary.    ENDOCRINOLOGY OFFICE VISIT - INITIAL CONSULT NOTE    ASSESSMENT & PLAN:   [name] Gross is a 18 y.o. male who I am asked to see in consultation by [name] for f/u of steroid induced hyperglycemia.     Steroid induced hyperglycemia.   Most recent A1c 5.0% (08/11/2019)  -Current regimen:  -Lantus 5 units nightly  - lispro 2-4 units with meals/snacks. Administer 2 units for small meal/snack and 4 units for full meal.   - lispro sensitive correction, 1:50>150 achs    Per BG readings provided by patient FBG seems to be in range. Mealtime BG elevated above goal of 150. Patient reports he is compliant with his insulin. Continues to be on prednisone 40mg  daily.   Plan  -Increase mealtime insulin to 6units  Continue 2units with snacks Continue the current sensitive correctional insulin  Continue 5units of lantus     RTC in 1 month    Patient seen and discussed with attending, Dr. 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    Patient discharged from the hospital 11/14 after being admitted for rejection with SBP and bacteremia. Endocrine was consulted as inpatient was on discharged the folllowing was recommended.     Currently on prendisone 40mg  daily, takes it 8AM.     Discharge recs:   - Check blood sugar achs  - lantus 5 units nightly              - If morning BG are elevated (>120) at home he may increase lantus 1 unit per day until seeing fasting glucose <120, with a max dose of lantus 10 units nightly.  - lispro 2-4 units with meals/snacks. Administer 2 units for small meal/snack and 4 units for full meal.   - lispro sensitive correction, 1:50>150 achs    Patient check BG 2-3x/day. Below are last 7 days.    11/14:  Evening - 196  ??  11/15:  Bkfst - 98  Lunch - 210  Evening - 136  ??  11/16:  Bkfst - 103  Lunch - 191  Evening - 242  ??  11/17:  Bkfst - 125  Lunch - 135  Evening - 213  ??  11/18:  Bkfst - 112  Lunch - 176  Evening - 244  ????  11/19:  Bkfst - 130  Lunch - 148    Review of Systems   10 point ROS negative other than mentioned above in HPI    Pmhx:  Past Medical History:   Diagnosis Date   ??? Acute rejection of liver transplant (CMS-HCC) 08/20/2017 Moderate acute cellular rejection with prominent centrilobular venulitis, RAI = 7/9 (portal inflammation: 3, bile duct inflammation/damage: 1, venous endothelial inflammation: 3)   ??? Depression    ??? MSSA bacteremia 08/27/2019   ??? Seasonal allergies    ??? Sickle cell trait (CMS-HCC)    ??? Strep Mitis Central line-associated bloodstream infection 08/27/2019       Family hx:  Family History   Problem Relation Age of Onset   ??? Diabetes Maternal Grandmother    ??? Diabetes Paternal Grandmother    ??? Hypertension Maternal Grandfather    ??? Hypertension Paternal Grandfather    ??? Asthma Brother    ??? Anesthesia problems Neg Hx    ??? Melanoma Neg Hx    ??? Basal cell carcinoma Neg Hx    ??? Squamous cell carcinoma Neg Hx      Social hx:  Alcohol- none  Tobacco -none  Illicit drug use-none    Allergies   Allergen Reactions   ??? Bee Pollen Rash     Pt has seasonal allergies that cause excessive sneezing and running nose- [name], NAII   ??? Pollen Extracts Rash     Pt has seasonal allergies that cause excessive sneezing and running nose- [name]       Current Outpatient Medications:   ???  blood sugar diagnostic Strp, Use to check blood sugar four times daily as directed., Disp: 100 strip, Rfl: 12  ???  blood-glucose meter kit, Use as instructed, Disp: 1 each, Rfl: 0  ???  calcium carbonate (OS-CAL) 1,500 mg (600 mg elem calcium) tablet, Take 1/2 tablet (300 mg elem calcium) by mouth Two (2) times a day. (Over the counter med), Disp: 60 tablet, Rfl: 0  ???  cholecalciferol, vitamin D3, 125 mcg (5,000 unit) tablet, Take 1 tablet (5,000 Units total) by mouth daily., Disp: 30 tablet, Rfl: 11  ???  clindamycin-benzoyl peroxide 1.2 %(1 % base) -5 % gel, Apply to rash on chest (Patient taking differently: Apply 1 application topically nightly. Apply to rash on chest), Disp: 45 g, Rfl: 0  ???  glucagon 0.5 mg/0.1 mL Syrg, Inject 0.1 mL under the skin once as needed for up to 1 dose., Disp: 0.2 mL, Rfl: 2 ???  glucose 4 GM chewable tablet, Chew 1 tablet (4 g total) 4 (four) times a day as needed for low blood sugar (refer to hypoglycemia guideline)., Disp: 50 tablet, Rfl: 12  ???  insulin glargine (LANTUS) 100 unit/mL injection, Inject 0.05 mL (5 Units total) under the skin nightly., Disp: 10 mL, Rfl: 0  ???  insulin lispro (HUMALOG) 100 unit/mL injection, Inject 0.02-0.04 mL (2-4 Units total) under the skin Four (4) times a day (before meals and nightly)., Disp: 10 mL, Rfl: 0  ???  insulin syr/ndl U100 half mark (BD INSULIN SYRINGE HALF UNIT) 0.3 mL 31 gauge  x 5/16 Syrg, Use to inject insulins up to 6 times daily as directed., Disp: 200 each, Rfl: 12  ???  ketoconazole (NIZORAL) 2 % shampoo, Apply topically daily as directed., Disp: 120 mL, Rfl: 0  ???  lactulose (CHRONULAC) 10 gram/15 mL solution, Take 30 mL (20 g total) by mouth daily as needed (for constipation)., Disp: 240 mL, Rfl: 0  ???  lancets Misc, Use to check blood glucose 6 times per day., Disp: 204 each, Rfl: 0  ???  melatonin 3 mg Tab, Take 1 tablet (3 mg total) by mouth every evening. (Patient taking differently: Take 3 mg by mouth nightly as needed. ), Disp: 60 tablet, Rfl: 0  ???  multivitamin, with zinc (AQUADEKS) 100-5 mcg-mg Chew, Chew 2 tablets daily., Disp: 60 tablet, Rfl: 0  ???  mycophenolate (MYFORTIC) 180 MG EC tablet, Take 3 tablets (540 mg total) by mouth Two (2) times a day., Disp: 180 tablet, Rfl: 11  ???  nystatin (MYCOSTATIN) 100,000 unit/mL suspension, Take 10 mls (1,000,000 Units total) by mouth Three (3) times a day., Disp: 210 mL, Rfl: 0  ???  ondansetron (ZOFRAN-ODT) 4 MG disintegrating tablet, Take 1 tablet (4 mg total) by mouth every eight (8) hours as needed for up to 7 days., Disp: 10 tablet, Rfl: 0  ???  pantoprazole (PROTONIX) 40 MG tablet, Take 1 tablet (40 mg total) by mouth daily., Disp: 30 tablet, Rfl: 0 ???  potassium & sodium phosphates 250mg  (PHOS-NAK/NEUTRA PHOS) 280-160-250 mg PwPk, Take 2 packets by mouth Three (3) times a day., Disp: 180 packet, Rfl: 0  ???  predniSONE (DELTASONE) 20 MG tablet, Take 2 tablets (40 mg total) by mouth daily., Disp: 60 tablet, Rfl: 0  ???  rifAXIMin (XIFAXAN) 550 mg Tab, Take 1 tablet (550 mg total) by mouth Two (2) times a day., Disp: 60 tablet, Rfl: 4  ???  sulfamethoxazole-trimethoprim (BACTRIM) 400-80 mg per tablet, Take 1 tablet (80 mg of trimethoprim total) by mouth Every Monday, Wednesday, and Friday., Disp: 12 tablet, Rfl: 0  ???  tacrolimus (PROGRAF) 1 MG capsule, Take 2 capsules (2 mg total) by mouth two (2) times a day. (Patient taking differently: Take 1 mg by mouth two (2) times a day. ), Disp: 120 capsule, Rfl: 0  ???  valGANciclovir (VALCYTE) 450 mg tablet, Take 1 tablet (450 mg total) by mouth daily., Disp: 30 tablet, Rfl: 4  ???  magnesium oxide (MAG-OX) 400 mg (241.3 mg magnesium) tablet, Take 1 tablet (400 mg total) by mouth daily. (Patient not taking: Reported on 09/29/2019), Disp: 30 tablet, Rfl: 3    OBJECTIVE:  Wt Readings from Last 3 Encounters:   09/24/19 54.5 kg (120 lb 2.4 oz) (4 %, Z= -1.76)*   09/13/19 53.8 kg (118 lb 9.6 oz) (3 %, Z= -1.86)*   09/07/19 56.1 kg (123 lb 10.9 oz) (6 %, Z= -1.53)*     * Growth percentiles are based on CDC (Boys, 2-20 Years) data.     Temp Readings from Last 3 Encounters:   09/30/19 36.7 ??C (Tympanic)   09/24/19 37.3 ??C (Oral)   09/15/19 37 ??C (Oral)     BP Readings from Last 3 Encounters:   09/30/19 102/57   09/24/19 108/64   09/16/19 122/68     Pulse Readings from Last 3 Encounters:   09/30/19 95   09/24/19 81   09/16/19 86     Physical Exam:     Was not performed due to virtual visit.  DATA REVIEW:  Labs: I personally reviewed lab work today.  Other medical data:   Reviewed and summarized (above) records in preparation for today's visit all pertinent notes in Epic/Media and CareEverywhere as well as any sent records. Labs:   Lab Results   Component Value Date    A1C 5.0 08/17/2019    A1C 4.9 10/13/2018    A1C 7.6 (H) 07/16/2018    A1C 5.1 05/13/2018    A1C 6.2 03/29/2018       Lab Results   Component Value Date    TRIG 49 08/12/2017    CHOL 127 08/12/2017    HDL 42 08/12/2017    LDL 75 08/12/2017       Lab Results   Component Value Date    TSH 0.797 02/11/2018    FREET4 1.49 02/11/2018       Lab Results   Component Value Date    CREATININE 1.53 (H) 09/27/2019       No results found for: CPEPTIDE    Imaging:

## 2019-09-30 NOTE — Unmapped (Cosign Needed)
2952: Patient arrived Transplant Infusion Room today for LVP, Condition: not well today; Mobility: via wheelchair; accompanied by mother.   8413: VS stable,  0958: PIV placed with brisk blood return. Occlusive and transparent dressing applied. Labs collected and sent.  1006: Provider Melvern Sample, ANP with patient. Patient does not need LVP per Bausch, ANP  1000: Albumin Infusion initiated.  1040: Albumin Infusion complete.  1042: Lasix 40 mg administered.  1100: VS stable, PIV removed, pt left clinic. Urine collected and sent. Condition: well; Mobility: via wheelchair; accompanied by mother.   See Flowsheet and MAR for all details of visit.

## 2019-09-30 NOTE — Unmapped (Signed)
[facility] SSC Specialty Medication Onboarding    Specialty Medication: Xifaxan  Prior Authorization: Not Required   Financial Assistance: No - copay  <$25  Final Copay/Day Supply: $0.00 / 30    Insurance Restrictions: None     Notes to Pharmacist: N/A    The triage team has completed the benefits investigation and has determined that the patient is able to fill this medication at [facility]. Please contact the patient to complete the onboarding or follow up with the prescribing physician as needed.

## 2019-09-30 NOTE — Unmapped (Signed)
Please increase your meal time insulin from 4units to 6units.  Continue 2units with snacks  Continue the current correctional insulin  Continue 5units of lantus

## 2019-09-30 NOTE — Unmapped (Signed)
TFC spoke with patient, [name] Gross, to discuss insurance benefits related to Liver transplant. [name] Gross verbalized understanding of financial agreement for transplant.    As part of this consultation [name] Gross was advised to:  Consider fundraising to help with transplant expenses

## 2019-10-01 LAB — ANTINUCLEAR ANTIBODIES (ANA): Lab: NEGATIVE

## 2019-10-01 NOTE — Unmapped (Signed)
Addended by: Royann Shivers R on: 09/30/2019 10:55 PM     Modules accepted: Level of Service

## 2019-10-02 LAB — ANTIMITOCHONDRIAL ANTIBODY: ANTI-MITOCHONDRIAL LEVEL: 2.7

## 2019-10-02 LAB — SYPHILIS RPR SCREEN: Reagin Ab:PrThr:Pt:Ser:Ord:RPR: NONREACTIVE

## 2019-10-02 LAB — ANTI-MITOCHONDRIAL ANTIBODY: Lab: NEGATIVE

## 2019-10-02 LAB — HEPATITIS B SURFACE ANTIGEN: Hepatitis B virus surface Ag:PrThr:Pt:Ser:Ord:: NONREACTIVE

## 2019-10-02 LAB — HIV ANTIGEN/ANTIBODY COMBO: HIV 1+2 Ab+HIV1 p24 Ag:PrThr:Pt:Ser/Plas:Ord:IA: NONREACTIVE

## 2019-10-02 LAB — HEPATITIS B CORE TOTAL ANTIBODY: Hepatitis B virus core Ab:PrThr:Pt:Ser/Plas:Ord:IA: NONREACTIVE

## 2019-10-02 LAB — HEPATITIS C ANTIBODY: Hepatitis C virus Ab:PrThr:Pt:Ser:Ord:: NONREACTIVE

## 2019-10-02 LAB — HEPATITIS B SURFACE ANTIBODY
HEPATITIS B SURFACE ANTIBODY QUANT: 12.27 m[IU]/mL — ABNORMAL HIGH (ref <8.00)
Hepatitis B virus surface Ab:PrThr:Pt:Ser:Ord:: REACTIVE — AB

## 2019-10-03 ENCOUNTER — Encounter: Admit: 2019-10-03 | Discharge: 2019-10-04 | Payer: PRIVATE HEALTH INSURANCE

## 2019-10-03 ENCOUNTER — Encounter: Admit: 2019-10-03 | Discharge: 2019-10-04 | Payer: PRIVATE HEALTH INSURANCE | Attending: Clinical | Primary: Clinical

## 2019-10-03 DIAGNOSIS — Z01818 Encounter for other preprocedural examination: Principal | ICD-10-CM

## 2019-10-03 DIAGNOSIS — T8691 Unspecified transplanted organ and tissue rejection: Principal | ICD-10-CM

## 2019-10-03 DIAGNOSIS — T8641 Liver transplant rejection: Principal | ICD-10-CM

## 2019-10-03 DIAGNOSIS — Z944 Liver transplant status: Principal | ICD-10-CM

## 2019-10-03 LAB — BLOOD GAS, ARTERIAL
BASE EXCESS ARTERIAL: -7.9 — ABNORMAL LOW (ref -2.0–2.0)
HCO3 ARTERIAL: 16 mmol/L — ABNORMAL LOW (ref 22–27)
O2 SATURATION ARTERIAL: 98.9 % (ref 94.0–100.0)
PH ARTERIAL: 7.39 (ref 7.35–7.45)
PO2 ARTERIAL: 125 mmHg — ABNORMAL HIGH (ref 80.0–110.0)

## 2019-10-03 LAB — COMPREHENSIVE METABOLIC PANEL
ALBUMIN: 3.3 g/dL — ABNORMAL LOW (ref 3.5–5.0)
ALKALINE PHOSPHATASE: 1201 U/L — ABNORMAL HIGH (ref 65–260)
AST (SGOT): 215 U/L — ABNORMAL HIGH (ref 19–55)
BILIRUBIN TOTAL: 34.9 mg/dL — ABNORMAL HIGH (ref 0.0–1.2)
BLOOD UREA NITROGEN: 32 mg/dL — ABNORMAL HIGH (ref 7–21)
CALCIUM: 9 mg/dL (ref 8.5–10.2)
CHLORIDE: 111 mmol/L — ABNORMAL HIGH (ref 98–107)
CREATININE: 1.28 mg/dL (ref 0.70–1.30)
EGFR CKD-EPI AA MALE: >90 mL/min/{1.73_m2} (ref >=60)
EGFR CKD-EPI NON-AA MALE: 81 mL/min/{1.73_m2} (ref >=60)
GLUCOSE RANDOM: 124 mg/dL — ABNORMAL HIGH (ref 70–99)
POTASSIUM: 3.6 mmol/L (ref 3.5–5.0)
SODIUM: 139 mmol/L (ref 135–145)

## 2019-10-03 LAB — CBC
HEMATOCRIT: 26.8 % — ABNORMAL LOW (ref 41.0–53.0)
HEMOGLOBIN: 8.9 g/dL — ABNORMAL LOW (ref 13.5–17.5)
MEAN CORPUSCULAR HEMOGLOBIN CONC: 33.1 g/dL (ref 31.0–37.0)
MEAN CORPUSCULAR HEMOGLOBIN: 30.1 pg (ref 26.0–34.0)
MEAN PLATELET VOLUME: 7 fL (ref 7.0–10.0)
PLATELET COUNT: 167 10*9/L (ref 150–440)
RED BLOOD CELL COUNT: 2.95 10*12/L — ABNORMAL LOW (ref 4.50–5.90)
RED CELL DISTRIBUTION WIDTH: 25.2 % — ABNORMAL HIGH (ref 12.0–15.0)

## 2019-10-03 LAB — PROTIME-INR: INR: 1.36

## 2019-10-03 LAB — PCO2 ARTERIAL: Carbon dioxide:PPres:Pt:BldA:Qn:: 27.7 — ABNORMAL LOW

## 2019-10-03 LAB — HEMOGLOBIN BLOOD GAS: Hemoglobin:MCnc:Pt:Bld:Qn:: 7.8 — ABNORMAL LOW

## 2019-10-03 LAB — PLATELET COUNT: Platelets:NCnc:Pt:Bld:Qn:Automated count: 167

## 2019-10-03 LAB — ALBUMIN: Albumin:MCnc:Pt:Ser/Plas:Qn:: 3.3 — ABNORMAL LOW

## 2019-10-03 LAB — PROTIME: Coagulation tissue factor induced:Time:Pt:PPP:Qn:Coag: 16 — ABNORMAL HIGH

## 2019-10-03 NOTE — Unmapped (Signed)
I attempted to get a urine specimen from the patient but patient expressed he had already urinated prior to his appointment and would not be able to submit a urine for this visit.

## 2019-10-03 NOTE — Unmapped (Signed)
Transplant Surgery History and Physical      Assessment/Recommendations:    [name] Gross is a 18 y.o. male seen in consultation at the request of Glorianne Manchester* for evaluation of candidacy for transplantation.    I spent 30 minutes with the patient obtaining the above history and physical examination, and greater than 50% of the time was spent counseling and on the substance of the discussion.    Today we discussed liver transplantation going over the surgery to be performed, the hospital course including length of stay, anti-rejection medications and their side effects, results and the cadaveric donor system.    I discussed in detail with [name] Gross the risks and benefits of liver transplantation, including but not limited to: the general anesthetic, monitoring lines, the incision, the hepatectomy, as well as reimplantation of the liver graft and immunosuppressant medications. In regards to the surgical procedure, we noted that it is a major operation performed under general anesthesia with the risks of heart attack, stroke and death. Multiple invasive means of monitoring may be necessary during the operation including an arterial line, a central venous catheter, a foley catheter inserted into the bladder, and a tube from your nose into your stomach to prevent stomach distension. After surgery, the patient will go to the Intensive Care Unit and is then sent to the regular floor when medically stable. I discussed the possible complications including the need for reoperation for bleeding, infection or other complications, the possibility of clotting/leakage of blood vessels, requiring either radiological intervention, surgical intervention, or even retransplantation. I reviewed the possibility of complications involving the biliary tract including leaks, strictures and need for retransplantation for biliary complications. I discussed the possibility of primary nonfunction of the liver graft requiring urgent retransplantation or the result of death. The patient understands the need for long-term immunosuppression therapy as well as monitoring of labs and immunosuppression. Anti-rejection medications, including Prograf or Cyclosporine (Neoral), Cellcept, steroids and others, will be needed after transplantation and for the patient???s entire lifetime. Problems include infection, cancer, hirsutism, tremors, gum swelling, hypertension, bone fractures, aggravation of diabetes or new onset diabetes, cataracts, and rashes. Finally, the donor system was reviewed. All donors are tested for infections and other diseases, but there is a small chance of transmission of diseases including viruses as well as the possible transmission of tumors. Some patients may elect to receive a liver from a donor who was exposed to the Hepatitis B or Hepatitis C virus and the recipient may require certain anti-viral medications to prevent this virus from damaging the new liver.    Finally, I reviewed with the patient how the surgery is expected to improve their health and quality of life, that the average length of hospitalization stay is 10-12 days, and that the length of their expected recovery period, including when normal daily activities may be resumed, will be patient dependent.    [name] Gross had all their questions answered and wishes to proceed with the liver transplant evaluation process.    - Needs nutritional optimization  - Prealbumin with next set of labs  - Needs psych and social work evaluation for re-transplant listing.  - Plan is to re-evaluate him in 3-4 weeks in clinic, have a better evaluation of his nutritional status, compliance and social support before getting his listed for re-transplant.    This patient was seen and evaluated with Dr. Celine Mans and decision was made to have him revisit transplant surgery clinic in 4 weeks for re-evaluation.  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 He was recently??admitted to the PICU on??09/18/2019??for concern for spontaneous bacterial peritonitis with fever, fatigue, and altered mental status. Patient was transfered to acute care floor on 11/9. Patient found to have a right upper extremity DVT on PVL on 11/9. Unclear if clot is old (could be 2/2 to PICC line), and with Joandy being asymptomatic and in consultation with Heme/onc no anticoagulation given in setting of G1 esophageal varices. He was discharged on 11/14 and says that has been doing well since then. He denies any fever, chills, hematemesis, melena. He does has ascites and worsening limb swelling. He says that his appetite is getting better.     MELD-Na score: 26 at 09/22/2019  6:04 AM -> 26 at 10/03/2019 at 1:00 PM  MELD score: 26 at 09/22/2019  6:04 AM  Calculated from:  Serum Creatinine: 1.25 mg/dL at 16/08/9603  5:40 AM  Serum Sodium: 136 mmol/L at 09/22/2019  6:04 AM  Total Bilirubin: 26.2 mg/dL at 98/09/9146  8:29 AM  INR(ratio): 1.50 at 09/22/2019  6:04 AM      Allergies    Bee pollen and Pollen extracts      Medications      Current Outpatient Medications   Medication Sig Dispense Refill   ??? blood sugar diagnostic Strp Use to check blood sugar four times daily as directed. 100 strip 12   ??? blood-glucose meter kit Use as instructed 1 each 0   ??? calcium carbonate (OS-CAL) 1,500 mg (600 mg elem calcium) tablet Take 1/2 tablet (300 mg elem calcium) by mouth Two (2) times a day. (Over the counter med) 60 tablet 0   ??? cholecalciferol, vitamin D3, 125 mcg (5,000 unit) tablet Take 1 tablet (5,000 Units total) by mouth daily. 30 tablet 11   ??? clindamycin-benzoyl peroxide 1.2 %(1 % base) -5 % gel Apply to rash on chest (Patient taking differently: Apply 1 application topically nightly. Apply to rash on chest) 45 g 0   ??? glucagon 0.5 mg/0.1 mL Syrg Inject 0.1 mL under the skin once as needed for up to 1 dose. 0.2 mL 2   ??? glucose 4 GM chewable tablet Chew 1 tablet (4 g total) 4 (four) times a day as needed for low blood sugar (refer to hypoglycemia guideline). 50 tablet 12   ??? insulin glargine (LANTUS) 100 unit/mL injection Inject 0.05 mL (5 Units total) under the skin nightly. 10 mL 0   ??? insulin lispro (HUMALOG) 100 unit/mL injection Inject 0.02-0.04 mL (2-4 Units total) under the skin Four (4) times a day (before meals and nightly). 10 mL 0   ??? insulin syr/ndl U100 half mark (BD INSULIN SYRINGE HALF UNIT) 0.3 mL 31 gauge x 5/16 Syrg Use to inject insulins up to 6 times daily as directed. 200 each 12   ??? ketoconazole (NIZORAL) 2 % shampoo Apply topically daily as directed. 120 mL 0   ??? lactulose (CHRONULAC) 10 gram/15 mL solution Take 30 mL (20 g total) by mouth daily as needed (for constipation). 240 mL 0   ??? lancets Misc Use to check blood glucose 6 times per day. 204 each 0   ??? magnesium oxide (MAG-OX) 400 mg (241.3 mg magnesium) tablet Take 1 tablet (400 mg total) by mouth daily. (Patient not taking: Reported on 09/29/2019) 30  tablet 3   ??? melatonin 3 mg Tab Take 1 tablet (3 mg total) by mouth every evening. (Patient taking differently: Take 3 mg by mouth nightly as needed. ) 60 tablet 0   ??? multivitamin, with zinc (AQUADEKS) 100-5 mcg-mg Chew Chew 2 tablets daily. 60 tablet 0   ??? mycophenolate (MYFORTIC) 180 MG EC tablet Take 3 tablets (540 mg total) by mouth Two (2) times a day. 180 tablet 11   ??? ondansetron (ZOFRAN-ODT) 4 MG disintegrating tablet Take 1 tablet (4 mg total) by mouth every eight (8) hours as needed for up to 7 days. 10 tablet 0   ??? pantoprazole (PROTONIX) 40 MG tablet Take 1 tablet (40 mg total) by mouth daily. 30 tablet 0   ??? potassium & sodium phosphates 250mg  (PHOS-NAK/NEUTRA PHOS) 280-160-250 mg PwPk Take 2 packets by mouth Three (3) times a day. 180 packet 0 ??? predniSONE (DELTASONE) 20 MG tablet Take 2 tablets (40 mg total) by mouth daily. 60 tablet 0   ??? rifAXIMin (XIFAXAN) 550 mg Tab Take 1 tablet (550 mg total) by mouth Two (2) times a day. 60 tablet 4   ??? sulfamethoxazole-trimethoprim (BACTRIM) 400-80 mg per tablet Take 1 tablet (80 mg of trimethoprim total) by mouth Every Monday, Wednesday, and Friday. 12 tablet 0   ??? tacrolimus (PROGRAF) 1 MG capsule Take 2 capsules (2 mg total) by mouth two (2) times a day. (Patient taking differently: Take 1 mg by mouth two (2) times a day. ) 120 capsule 0   ??? valGANciclovir (VALCYTE) 450 mg tablet Take 1 tablet (450 mg total) by mouth daily. 30 tablet 4     No current facility-administered medications for this visit.          Past Medical History    Past Medical History:   Diagnosis Date   ??? 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 Depression    ??? MSSA bacteremia 08/27/2019   ??? Seasonal allergies    ??? Sickle cell trait (CMS-HCC)    ??? Strep Mitis Central line-associated bloodstream infection 08/27/2019         Past Surgical History    Past Surgical History:   Procedure Laterality Date   ??? FRENULECTOMY, LINGUAL     ??? PR ERCP REMOVE FOREIGN BODY/STENT BILIARY/PANC DUCT N/A 08/20/2017    Procedure: ENDOSCOPIC RETROGRADE CHOLANGIOPANCREATOGRAPHY (ERCP); W/ REMOVAL OF FOREIGN BODY/STENT FROM BILIARY/PANCREATIC DUCT(S);  Surgeon: Vonda Antigua, MD;  Location: [facility];  Service: Gastroenterology   ??? PR ERCP REMOVE FOREIGN BODY/STENT BILIARY/PANC DUCT N/A 06/04/2018    Procedure: ENDOSCOPIC RETROGRADE CHOLANGIOPANCREATOGRAPHY (ERCP); W/ REMOVAL OF FOREIGN BODY/STENT FROM BILIARY/PANCREATIC DUCT(S);  Surgeon: Chriss Driver, MD;  Location: GI PROCEDURES [facility];  Service: Gastroenterology   ??? PR ERCP STENT PLACEMENT BILIARY/PANCREATIC DUCT N/A 11/21/2016    Procedure: ENDOSCOPIC RETROGRADE CHOLANGIOPANCREATOGRAPHY (ERCP); WITH PLACEMENT OF ENDOSCOPIC STENT INTO BILIARY OR PANCREATIC DUCT;  Surgeon: Vonda Antigua, MD;  Location: GI PROC[facility]URES [facility];  Service: Gastroenterology   ??? PR ERCP,W/REMOVAL STONE,BIL/PANCR DUCTS N/A 08/20/2017    Procedure: ERCP; W/ENDOSCOPIC RETROGRADE REMOVAL OF CALCULUS/CALCULI FROM BILIARY &/OR PANCREATIC DUCTS;  Surgeon: Vonda Antigua, MD;  Location: GI PROCEDURES [facility];  Service: Gastroenterology   ??? PR ERCP,W/REMOVAL STONE,BIL/PANCR DUCTS N/A 06/04/2018    Procedure: ERCP; W/ENDOSCOPIC RETROGRADE REMOVAL OF CALCULUS/CALCULI  FROM BILIARY &/OR PANCREATIC DUCTS;  Surgeon: Chriss Driver, MD;  Location: GI PROCEDURES [facility];  Service: Gastroenterology   ??? PR TRANSPLANT LIVER,ALLOTRANSPLANT N/A 09/22/2016    Procedure: LIVER ALLOTRANSPLANTATION; ORTHOTOPIC, PARTIAL OR WHOLE, FROM CADAVER OR LIVING DONOR, ANY AGE;  Surgeon: Doyce Loose, MD;  Location: MAIN OR [facility];  Service: Transplant   ??? PR TRANSPLANT,PREP DONOR LIVER, WHOLE N/A 09/22/2016    Procedure: [facility] STD PREP CAD DONOR WHOLE LIVER GFT PRIOR TNSPLNT,INC CHOLE,DISS/REM SURR TISSU WO TRISEG/LOBE SPLT;  Surgeon: Doyce Loose, MD;  Location: MAIN OR [facility];  Service: Transplant   ??? PR UPGI ENDOSCOPY W/US FN BX N/A 08/20/2017    Procedure: UGI W/ TRANSENDOSCOPIC ULTRASOUND GUIDED INTRAMURAL/TRANSMURAL FINE NEEDLE ASPIRATION/BIOPSY(S), ESOPHAGUS;  Surgeon: Vonda Antigua, MD;  Location: GI PROCEDURES [facility];  Service: Gastroenterology   ??? PR UPPER GI ENDOSCOPY,BIOPSY N/A 07/15/2016    Procedure: UGI ENDOSCOPY; WITH BIOPSY, SINGLE OR MULTIPLE;  Surgeon: Arnold Long Mir, MD;  Location: PEDS PROCEDURE ROOM [facility];  Service: Gastroenterology   ??? PR UPPER GI ENDOSCOPY,CTRL BLEED Left 05/05/2016    Procedure: UGI ENDOSCOPY; WITH CONTROL OF BLEEDING, ANY METHOD;  Surgeon: Arnold Long Mir, MD;  Location: PEDS PROCEDURE ROOM [facility];  Service: Gastroenterology   ??? PR UPPER GI ENDOSCOPY,CTRL BLEED N/A 05/12/2016    Procedure: UGI ENDOSCOPY; WITH CONTROL OF BLEEDING, ANY METHOD;  Surgeon: Annie Paras, MD;  Location: CHILDRENS OR [facility];  Service: Gastroenterology   ??? PR UPPER GI ENDOSCOPY,DIAGNOSIS N/A 09/21/2019    Procedure: UGI ENDO, INCLUDE ESOPHAGUS, STOMACH, & DUODENUM &/OR JEJUNUM; DX W/WO COLLECTION SPECIMN, BY BRUSH OR WASH;  Surgeon: Monte Fantasia, MD;  Location: GI PROCEDURES [facility];  Service: Gastroenterology         Family History    The patient's family history includes Asthma in his brother; Diabetes in his maternal grandmother and paternal grandmother; Hypertension in his maternal grandfather and paternal grandfather..      Social History:    Tobacco use: denies  Alcohol use: denies  Drug use: denies      Review of Systems    A 12 system review of systems was negative except as noted in HPI    Objective     PE: There were no vitals taken for this visit. There is no height or weight on file to calculate BMI.  General: alert and oriented  Lungs: clear to auscultation, percussion to the bases, and unlabored breathing  Heart: euvolemic, regular rate and rhythm, normal S1 and S2, no murmur  Abd: soft, distended, mild tenderness, no rebound or guarding, free fluid+  Ascites: moderate , fluid thrill +  Skin: jaundice  Ext: edema  Neuro: non-focal exam. thought organized, appropriate affect, normal fluent speech        Test Results    Labs:  All lab results last 24 hours:    Recent Results (from the past 24 hour(s))   PT-INR    Collection Time: 10/03/19  9:09 AM   Result Value Ref Range    PT 16.0 (H) 10.5 - 13.5 sec    INR 1.36    Comprehensive Metabolic Panel    Collection Time: 10/03/19  9:09 AM   Result Value Ref Range    Sodium 139 135 - 145 mmol/L    Potassium 3.6 3.5 - 5.0 mmol/L    Chloride 111 (H) 98 - 107 mmol/L    Anion Gap      CO2  BUN 32 (H) 7 - 21 mg/dL    Creatinine 6.04 5.40 - 1.30 mg/dL    BUN/Creatinine Ratio 25     EGFR CKD-EPI Non-African American, Male 23 >=60 mL/min/1.101m2    EGFR CKD-EPI African American, Male >90 >=60 mL/min/1.71m2    Glucose 124 (H) 70 - 99 mg/dL    Calcium 9.0 8.5 - 98.1 mg/dL    Albumin 3.3 (L) 3.5 - 5.0 g/dL    Total Protein      Total Bilirubin 34.9 (H) 0.0 - 1.2 mg/dL    AST 191 (H) 19 - 55 U/L    ALT      Alkaline Phosphatase 1,201 (H) 65 - 260 U/L   CBC    Collection Time: 10/03/19  9:09 AM   Result Value Ref Range    WBC 16.3 (H) 4.5 - 11.0 10*9/L    RBC 2.95 (L) 4.50 - 5.90 10*12/L    HGB 8.9 (L) 13.5 - 17.5 g/dL    HCT 47.8 (L) 29.5 - 53.0 %    MCV 90.8 80.0 - 100.0 fL    MCH 30.1 26.0 - 34.0 pg    MCHC 33.1 31.0 - 37.0 g/dL    RDW 62.1 (H) 30.8 - 15.0 %    MPV 7.0 7.0 - 10.0 fL    Platelet 167 150 - 440 10*9/L    Results Verified by Slide Scan Slide Reviewed    Blood Gas, Arterial Specify Site: Arterial; FIO2 Arterial: Not Specified    Collection Time: 10/03/19  9:30 AM   Result Value Ref Range    Specimen Source Arterial     FIO2 Arterial Not Specified     pH, Arterial 7.39 7.35 - 7.45    pO2, Arterial 125.0 (H) 80.0 - 110.0 mm Hg    pCO2, Arterial 27.7 (L) 35.0 - 45.0 mm Hg    HCO3 (Bicarbonate), Arterial 16 (L) 22 - 27 mmol/L    Base Excess, Arterial -7.9 (L) -2.0 - 2.0    O2 Sat, Arterial 98.9 94.0 - 100.0 %   Hemoglobin, Blood Gas    Collection Time: 10/03/19  9:30 AM   Result Value Ref Range    Hgb, blood gas 7.80 (L) 13.50 - 17.50 g/dL   Spirometry Pre/Post    Collection Time: 10/03/19 10:01 AM   Result Value Ref Range    FVC PRE 3.08 (L) 3.43 - 5.21 L    FEV1 PRE 2.08 (L) 2.93 - 4.51 L    FEV1/FVC PRE 67.33 (L) 75.35 - 96.18 %    FEF25-75% PRE 1.38 (L) 2.62 - 6.00 L/s    ISOFEF25-75 PRE 1.38 L/s    FEF50% PRE 2.94 L/s    PEF PRE 4.90 (L) 6.65 - 11.67 L/s    FET100% Change 4.10 sec    FIVC PRE 0.10 (L) 3.43 - 5.21 L    PIF PRE 0.85 L/s    Vol extrap pre 0.06 L    DLCO PRE 15.89 (L) 29.04 - 45.02 ml/(min*mmHg) DL Adj PRE 65.78 (L) 46.96 - 45.02 ml/(min*mmHg)    DLCO/VA POST 3.41 (L) 4.42 - 6.82 ml/(min*mmHg*L)    DL/VA Adj PRE 2.95 2.84 - 6.82 ml/(min*mmHg*L)    VA PRE 4.67 (L) 5.81 - 5.81 L    IVC PRE 2.91 (L) 3.43 - 5.21 L    BHT POST 10.57 sec   PFT DLCO  (Diffusion Studies)    Collection Time: 10/03/19 10:01 AM   Result Value Ref Range    FVC  PRE 3.08 (L) 3.43 - 5.21 L    FEV1 PRE 2.08 (L) 2.93 - 4.51 L    FEV1/FVC PRE 67.33 (L) 75.35 - 96.18 %    FEF25-75% PRE 1.38 (L) 2.62 - 6.00 L/s    ISOFEF25-75 PRE 1.38 L/s    FEF50% PRE 2.94 L/s    PEF PRE 4.90 (L) 6.65 - 11.67 L/s    FET100% Change 4.10 sec    FIVC PRE 0.10 (L) 3.43 - 5.21 L    PIF PRE 0.85 L/s    Vol extrap pre 0.06 L    DLCO PRE 15.89 (L) 29.04 - 45.02 ml/(min*mmHg)    DL Adj PRE 16.10 (L) 96.04 - 45.02 ml/(min*mmHg)    DLCO/VA POST 3.41 (L) 4.42 - 6.82 ml/(min*mmHg*L)    DL/VA Adj PRE 5.40 9.81 - 6.82 ml/(min*mmHg*L)    VA PRE 4.67 (L) 5.81 - 5.81 L    IVC PRE 2.91 (L) 3.43 - 5.21 L    BHT POST 10.57 sec       Imaging:

## 2019-10-03 NOTE — Unmapped (Signed)
ABG drawn on room air without complications.

## 2019-10-03 NOTE — Unmapped (Signed)
Confidential Psychological Therapy Session  [facility] for Transplant Care    Patient Name: [name] Gross  Medical Record Number: [id number]  Date of Service: October 03, 2019  Clinical Psychologist: Artemio Aly, PhD  Intern: None  Time Spent: 45 min of face-to-face counseling  CPT Procedure Code: 72536 (45 min psychotherapy with patient and/or family)  Therapy Type: Behavior Modifying/Cognitive Behavioral Therapy (CBT)  Purpose of Treatment: adjustment to chronic medical illness,??improve coping,??reduce depression symptoms.    Referral/Relevant History:   Mr.??[name] Gross??is a very pleasant 18 y.o.??male??who presents for cognitive behavioral therapy to address adjustment to chronic medical illness (and recent hospitalization for liver transplant rejection). He was seen by the adult inpatient psychiatry team on 08/23/19 for a history of MDD (and two previous suicide attempts, last attempt April 2019) and current depressive symptoms. During assessment??with psychiatry, Mr. Odor declined pharmacotherapy but stated that he would be interested in receiving therapy services while inpatient to work through his difficult medical diagnoses and family situation.??He was seen by this provider for an initial visit on 08/29/19 and noted improved mood after reconnecting with social support, and requested continued psychology follow-up while inpatient. He was seen again by inpatient psychiatry on 09/01/19 and continued to decline pharmacotherapy, but later expressed interest in this and was prescribed venlafaxine prior to discharge from the hospital on 09/10/19 but was discontinued during a hospitalization on 09/18/19 due to potential contribution to a hand tremor.  ??  [name] was seen by this provider on 09/13/19 following discharge from the hospital for therapy, but then re-presented to the hospital on 09/15/19 for elevated creatinine prior to leaving AMA. He was then re-admitted to the hospital on 09/18/19 for a fever and was seen again during his inpatient stay by this provider on 09/20/19. Today is his 2nd outpatient session (and 5th overall) with Clinical research associate.     Review of Symptoms/ROS: Deferred    Subjective:   [name] reported that he has been doing pretty well since being discharged from the hospital on 09/24/19. He described feeling well overall despite some pain and discomfort in his feet from swelling. He described having a pretty good appetite and feeling like he is regaining strength, as he has been able to stand for long enough to cook his own meals and is ambulating more around the house. He continues to do PT exercises at home regularly.    With regards to mood, [name] reported that he has been feeling okay, fine and denied any current concerns with depression or anxiety. He did note frequently feeling annoyed, but attributed this to be an introvert and being frequently asked to do things he does not want to do. He noted that he has been trying to engage in more enjoyable activities like playing video games, talking to friends, and cooking. He has been splitting some time between living at his grandmother's home and his mother's home, though he noted that he has only spent one night at his mother's so far. He denied any issues with this arrangement, and believes she has been doing better about not being too intrusive; he denied any recent arguments or fights.    [name]' mother was present at the beginning of today's session and corroborated his self-report, noting that he seems to be getting stronger and is determined to improve his health. She inquired about why his antidepressant was discontinued and wanted to know if he would be starting a new one.     During discussion of cognitive coping  strategies today, [name] had some difficulty identifying previous thoughts which led to feeling depressed. Time was spent practicing cognitive restructuring with a general depressive thought (I can't handle this), and he noted that he already tries to challenge negative thoughts like this. He was encouraged to practice cognitive restructuring techniques for homework.    Objective:     Mental Status Exam:  Appearance: Malnourished and jaundiced, with some swelling noted in feet and abdomen  Motor: Very small hand tremor, less noticeable than previous sessions  Speech/Language: Quiet speech, but normal rate and rhythm   Mood: Okay, fine  Affect: Sad and somewhat flat, but he noted that he has always been quiet and private at baseline. More expressive than in previous sessions and laughed several times.  Thought Process: Logical, linear, clear, coherent, goal directed  Thought Content: Denies SI, HI, self harm, delusions, obsessions, paranoid ideation, or ideas of reference  Perceptual Disturbances: Denies auditory and visual hallucinations, behavior not concerning for response to internal stimuli  Orientation: Oriented to person, place, time, and general circumstances  Attention: Able to fully attend without fluctuations in consciousness  Concentration: Able to fully concentrate and attend  Memory: Immediate, short-term, long-term, and recall grossly intact  Fund of Knowledge: Consistent with level of education and development  Insight: Intact  Judgment: Intact  Impulse Control: Intact    Assessment:  [name] participated well in this CBT session and exhibits good motivation towards treatment goals.     Mr.??[name] Gross??described his mood as okay, fine??and denied current concerns with anxiety or depression; his scores on the PHQ-9 and GAD-7 were both in the minimal range. He continues to look mildly depressed, as he is quiet and somewhat difficult to engage; however, he notes that this may be his baseline and that he is a quiet and private person in general, and actually appeared somewhat brighter during today's session despite being seen at the end of a long day of appointments. He appears to meet criteria for recurrent MDD, and seems to be in early remission at this time given recent improvements in the past several weeks.??It is likely that continuing to promote ways to help Mr. Nelis remain engaged and socially connected will be helpful, especially given this new hospitalization. Continuing to promote behavioral activation and goal setting will also likely be particularly useful. Additionally, exploring his values in life and finding ways to help him maintain some independence will likely be very beneficial. Improvements??in depression and anxiety symptoms can contribute to improved quality of life, functionality, and ability to cope with chronic illness.   ??  Focus on current treatment is??reduction of depressive symptoms, increasing behavioral activation, learning cognitive restructuring strategies, adjustment to chronic medical conditions.   ??  Focus on future sessions will include??values exploration, behavioral activation, learning cognitive coping strategies, relaxation strategies.  ??  Adherence concerns:??[name] left AMA after presenting to the hospital on 09/15/19, despite speaking with his TNC Jamey Reas. He reported that he understands why leaving AMA is concerning to the transplant team, but felt overwhelmed after being in excruciating pain and felt like he needed to leave. Mr. Ritchie appears to be struggling with the balance between wanting independence and needing medical assistance, but expressed a willingness to adhere to medical recommendations. He appears to be doing well with medication management and attendance to outpatient appts recently.   ??  Diagnostic Impression:??Major Depressive Disorder, Recurrent Episode, Moderate Severity, in early remission    Risk Assessment:  A suicide and violence risk  assessment was performed as part of this evaluation. The patient is deemed to be at chronic elevated risk for self-harm/suicide given the following factors: male age 20-35, current diagnosis of depression, previous acts of self-harm and chronic severe medical condition. The patient is deemed to be at chronic elevated risk for violence given the following factors: male gender and younger age. These risk factors are mitigated by the following factors:lack of active SI/HI, no know access to weapons or firearms, motivation for treatment, presence of an available support system, enjoyment of leisure actvities, expresses purpose for living, current treatment compliance, safe housing and support system in agreement with treatment recommendations. There is no acute risk for suicide or violence at this time. The patient was educated about relevant modifiable risk factors including following recommendations for treatment of psychiatric illness and abstaining from substance abuse.    While future psychiatric events cannot be accurately predicted, the patient does not currently require  acute inpatient psychiatric care and does not currently meet [facility] involuntary commitment criteria.      Psychometric Testing: Pt completed the PHQ-9, which is a brief screener of depressive symptoms. He obtained a score of 0, which is indicative of no symptoms of depression. This is slightly decreased compared to his previous score of 3 on 09/13/19.  ??  Pt also completed the GAD-7, which is a brief screener of anxiety symptoms. He obtained a score of 4 (primarily endorsed feeling annoyed/irritable daily), which is indicative of minimal symptoms of anxiety. This is slightly increased compared to his previous score of 2 on 09/13/19.    Plan:  Mr.  Tibbetts will continue meeting with me for CBT, and was given instructions for therapeutic homework, including practicing cognitive restructuring strategies using thought records provided. He was also encouraged to continue reflecting on his values and setting small behavioral goals consistent with these values.     Mr. Hannen will return in approximately one month. If possible, his appt will be scheduled to coincide with other medical appts, or a phone/video visit can be scheduled.      Mr.  Faircloth was given this writer's business card with confidential voice mail number and instructed to call 911 for emergencies.

## 2019-10-04 ENCOUNTER — Ambulatory Visit: Admit: 2019-10-04 | Discharge: 2019-10-05 | Payer: PRIVATE HEALTH INSURANCE

## 2019-10-04 ENCOUNTER — Encounter
Admit: 2019-10-04 | Discharge: 2019-10-05 | Payer: PRIVATE HEALTH INSURANCE | Attending: Pediatric Gastroenterology | Primary: Pediatric Gastroenterology

## 2019-10-04 DIAGNOSIS — K7290 Hepatic failure, unspecified without coma: Principal | ICD-10-CM

## 2019-10-04 LAB — EPSTEIN-BARR VCA IGG ANTIBODY: Lab: POSITIVE — AB

## 2019-10-04 LAB — TOXOPLASMA GONDII IGG: Lab: NEGATIVE

## 2019-10-04 LAB — VARICELLA ZOSTER IGG: Varicella zoster virus Ab.IgG:PrThr:Pt:Ser:Ord:: NEGATIVE

## 2019-10-04 LAB — TOXOPLASMA GONDII ANTIBODY, IGG: TOXOPLASMA GONDII IGG: NEGATIVE

## 2019-10-04 LAB — QUANTIFERON TB GOLD PLUS
QUANTIFERON ANTIGEN 1 MINUS NIL: 0 [IU]/mL
QUANTIFERON ANTIGEN 2 MINUS NIL: 0.01 [IU]/mL
QUANTIFERON TB GOLD PLUS: UNDETERMINED — AB
QUANTIFERON TB NIL VALUE: 0.02 [IU]/mL

## 2019-10-04 LAB — RUBELLA IGG SCREEN: Rubella virus Ab.IgG:PrThr:Pt:Ser:Ord:: NEGATIVE

## 2019-10-04 LAB — HERPES SIMPLEX VIRUS 1 IGG: Lab: NEGATIVE

## 2019-10-04 LAB — TB MITOGEN VALUE: Lab: 0.02

## 2019-10-04 LAB — TB NIL VALUE: Lab: 0.02

## 2019-10-04 LAB — QUANTIFERON MITOGEN: Lab: 0

## 2019-10-04 LAB — ETHYL GLUCURONIDE SCREEN, URINE: Ethyl glucuronide:MCnc:Pt:Urine:Qn:: NEGATIVE

## 2019-10-04 LAB — TB AG1 VALUE: Lab: 0.02

## 2019-10-04 LAB — CMV IGG: Lab: POSITIVE — AB

## 2019-10-04 LAB — TB AG2 VALUE: Lab: 0.03

## 2019-10-04 LAB — TB AG2: TB AG2 VALUE: 0.03

## 2019-10-05 LAB — EBV VIRAL LOAD RESULT: Lab: NOT DETECTED

## 2019-10-05 LAB — NICOTINE, URINE: Nicotine:MCnc:Pt:Urine:Qn:: <5

## 2019-10-05 LAB — NICOTINE SCREEN, URINE
NICOTINE, URINE: <5 ng/mL
NORNICOTINE, URINE: <2 ng/mL

## 2019-10-05 LAB — SMOOTH MUSCLE ANTIBODY: Smooth muscle Ab:PrThr:Pt:Ser:Ord:IF: NEGATIVE

## 2019-10-06 NOTE — Unmapped (Signed)
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 male.    HPI - this patient had an in-person visit for f/u liver transplant failure --  He has severe chronic rejection - he is being assessed for repeat liver transplant  - still very jaundice with ascites and some feet swelling  - has diabetes and takes insulin  - takes tacro 1 mg bid, cellcept bid, valcyte, xifaxan, mag oxide, 40 mg steroids  - had clot in right arm from PICC line - no anti-coag now - for f/u Doppler Dec 9  - he had IV albumin and lasix last week and this improved his ascites and swollen feet  - has been talking consistently with Psych (Dr. Neale Burly) - on no meds  - stools 5X daily - no blood    - no fever, no pain, no vomiting - living with mom and GM  - seems motivated for a second liver transplant             The following portions of the patient's history were reviewed and updated as appropriate: allergies, current medications, past family history, past medical history, past social history, past surgical history and problem list.      Review of Systems   Constitutional: Negative.    HENT: Negative.    Eyes: Negative.    Respiratory: Negative.    Cardiovascular: Negative.    Gastrointestinal: Positive for abdominal distention. Negative for abdominal pain, anal bleeding, blood in stool, constipation, diarrhea, nausea, rectal pain and vomiting.   Endocrine: Positive for polyuria.   Musculoskeletal: Negative.    Skin: Positive for color change.   Allergic/Immunologic: Negative.    Neurological: Negative.    Hematological: Negative.            Objective:    Physical Exam  Vitals signs reviewed.   HENT:      Nose: Nose normal.   Neck:      Musculoskeletal: Normal range of motion. Cardiovascular:      Rate and Rhythm: Normal rate.   Pulmonary:      Effort: Pulmonary effort is normal.   Abdominal:      General: There is distension.      Palpations: Abdomen is soft. There is no mass.      Tenderness: There is no abdominal tenderness. There is no right CVA tenderness, left CVA tenderness, guarding or rebound.      Hernia: No hernia is present.   Musculoskeletal: Normal range of motion.   Skin:     Capillary Refill: Capillary refill takes less than 2 seconds.      Coloration: Skin is jaundiced.   Neurological:      General: No focal deficit present.      Mental Status: He is alert.   Psychiatric:         Mood and Affect: Mood normal.     - ascites, still feet have pitting edema

## 2019-10-07 NOTE — Unmapped (Signed)
Outpatient appt cancelled as pt admitted to hospital

## 2019-10-10 ENCOUNTER — Other Ambulatory Visit (HOSPITAL_COMMUNITY)
Admission: RE | Admit: 2019-10-10 | Discharge: 2019-10-10 | Disposition: A | Discharge status: Home / Self Care | Payer: Medicaid Other | Source: Other Acute Inpatient Hospital | Attending: Pediatric Gastroenterology | Admitting: Pediatric Gastroenterology

## 2019-10-10 DIAGNOSIS — Z4823 Encounter for aftercare following liver transplant: Secondary | ICD-10-CM | POA: Insufficient documentation

## 2019-10-10 DIAGNOSIS — Z79899 Other long term (current) drug therapy: Secondary | ICD-10-CM | POA: Insufficient documentation

## 2019-10-10 LAB — PROTIME-INR
INR: 1.4 — ABNORMAL HIGH (ref 0.8–1.2)
Prothrombin Time: 16.8 seconds — ABNORMAL HIGH (ref 11.4–15.2)

## 2019-10-10 LAB — COMPREHENSIVE METABOLIC PANEL
ALKALINE PHOSPHATASE: 1465 U/L — ABNORMAL HIGH
ALT (SGPT): 348 U/L — ABNORMAL HIGH
ALT: 348 U/L — ABNORMAL HIGH (ref 0–44)
AST (SGOT): 284 U/L — ABNORMAL HIGH
AST: 284 U/L — ABNORMAL HIGH (ref 15–41)
Albumin: 2.4 g/dL — ABNORMAL LOW (ref 3.5–5.0)
Alkaline Phosphatase: 1465 U/L — ABNORMAL HIGH (ref 38–126)
Anion gap: 11 (ref 5–15)
BILIRUBIN TOTAL: 39.8 mg/dL — ABNORMAL HIGH
BUN: 26 mg/dL — ABNORMAL HIGH (ref 6–20)
CO2: 17 mmol/L — ABNORMAL LOW (ref 22–32)
CO2: 17 mmol/L — ABNORMAL LOW
CREATININE: 1.28 mg/dL — ABNORMAL HIGH
Calcium: 8.6 mg/dL — ABNORMAL LOW (ref 8.9–10.3)
Chloride: 106 mmol/L (ref 98–111)
Creatinine, Ser: 1.28 mg/dL — ABNORMAL HIGH (ref 0.61–1.24)
GFR calc Af Amer: >60 mL/min (ref >60)
GFR calc non Af Amer: >60 mL/min (ref >60)
GLUCOSE RANDOM: 224 mg/dL — ABNORMAL HIGH
Glucose, Bld: 224 mg/dL — ABNORMAL HIGH (ref 70–99)
POTASSIUM: 3.3 mmol/L — ABNORMAL LOW
PROTEIN TOTAL: 5.5 g/dL — ABNORMAL LOW
Potassium: 3.3 mmol/L — ABNORMAL LOW (ref 3.5–5.1)
SODIUM: 134 mmol/L — ABNORMAL LOW
Sodium: 134 mmol/L — ABNORMAL LOW (ref 135–145)
Total Bilirubin: 39.8 mg/dL (ref 0.3–1.2)
Total Protein: 5.5 g/dL — ABNORMAL LOW (ref 6.5–8.1)

## 2019-10-10 LAB — CBC WITH DIFFERENTIAL/PLATELET
Abs Immature Granulocytes: 0.52 10*3/uL — ABNORMAL HIGH (ref 0.00–0.07)
Basophils Absolute: 0 10*3/uL (ref 0.0–0.1)
Basophils Relative: 0 %
Eosinophils Absolute: 0 10*3/uL (ref 0.0–0.5)
Eosinophils Relative: 0 %
HCT: 24.7 % — ABNORMAL LOW (ref 39.0–52.0)
Hemoglobin: 9.1 g/dL — ABNORMAL LOW (ref 13.0–17.0)
Immature Granulocytes: 4 %
Lymphocytes Relative: 5 %
Lymphs Abs: 0.6 10*3/uL — ABNORMAL LOW (ref 0.7–4.0)
MCH: 28.8 pg (ref 26.0–34.0)
MCHC: 36.8 g/dL — ABNORMAL HIGH (ref 30.0–36.0)
MCV: 78.2 fL — ABNORMAL LOW (ref 80.0–100.0)
Monocytes Absolute: 1 10*3/uL (ref 0.1–1.0)
Monocytes Relative: 8 %
Neutro Abs: 10.8 10*3/uL — ABNORMAL HIGH (ref 1.7–7.7)
Neutrophils Relative %: 83 %
Platelets: 170 10*3/uL (ref 150–400)
RBC: 3.16 MIL/uL — ABNORMAL LOW (ref 4.22–5.81)
RDW: 27.8 % — ABNORMAL HIGH (ref 11.5–15.5)
WBC: 12.9 10*3/uL — ABNORMAL HIGH (ref 4.0–10.5)
nRBC: 0.5 % — ABNORMAL HIGH (ref 0.0–0.2)

## 2019-10-10 LAB — PHOSPHORUS
Lab: 5 — ABNORMAL HIGH
Phosphorus: 5 mg/dL — ABNORMAL HIGH (ref 2.5–4.6)

## 2019-10-10 LAB — GAMMA GT
GAMMA GLUTAMYL TRANSFERASE: 567 U/L — ABNORMAL HIGH
GGT: 567 U/L — ABNORMAL HIGH (ref 7–50)

## 2019-10-10 LAB — MAGNESIUM
Lab: 1.4 — ABNORMAL LOW
MAGNESIUM: 1.4 mg/dL — ABNORMAL LOW
Magnesium: 1.4 mg/dL — ABNORMAL LOW (ref 1.7–2.4)

## 2019-10-10 LAB — CBC W/ DIFFERENTIAL
EOSINOPHILS ABSOLUTE COUNT: 0 10*9/L
HEMATOCRIT: 24.7 % — ABNORMAL LOW
HEMOGLOBIN: 9.1 g/dL — ABNORMAL LOW
LYMPHOCYTES ABSOLUTE COUNT: 0.6 10*9/L — ABNORMAL LOW
MONOCYTES ABSOLUTE COUNT: 1 10*9/L
NEUTROPHILS ABSOLUTE COUNT: 10.8 10*9/L — ABNORMAL HIGH
PLATELET COUNT: 170 10*9/L
WBC ADJUSTED: 12.9 10*9/L — ABNORMAL HIGH

## 2019-10-10 LAB — PROTIME: Lab: 16.8 — ABNORMAL HIGH

## 2019-10-10 LAB — PROTEIN TOTAL: Lab: 5.5 — ABNORMAL LOW

## 2019-10-10 LAB — ALBUMIN: Lab: 2.4 — ABNORMAL LOW

## 2019-10-10 LAB — LYMPHOCYTES RELATIVE PERCENT: Lab: 0

## 2019-10-10 LAB — GAMMA GLUTAMYL TRANSFERASE: Lab: 567 — ABNORMAL HIGH

## 2019-10-10 NOTE — Unmapped (Signed)
As per patient's Nurse Coordinator/Marc Susann Givens entered patient's most recent labs drawn on 10/10/19 at [facility] from Care Everywhere.

## 2019-10-11 LAB — TACROLIMUS LEVEL: Tacrolimus (FK506) - LabCorp: 4.2 ng/mL (ref 2.0–20.0)

## 2019-10-11 MED FILL — PREDNISONE 20 MG TABLET: ORAL | 30 days supply | Qty: 60 | Fill #0

## 2019-10-11 MED FILL — SULFAMETHOXAZOLE 400 MG-TRIMETHOPRIM 80 MG TABLET: ORAL | 28 days supply | Qty: 12 | Fill #0

## 2019-10-11 MED FILL — SULFAMETHOXAZOLE 400 MG-TRIMETHOPRIM 80 MG TABLET: 28 days supply | Qty: 12 | Fill #0 | Status: AC

## 2019-10-11 MED FILL — XIFAXAN 550 MG TABLET: 30 days supply | Qty: 60 | Fill #0 | Status: AC

## 2019-10-11 MED FILL — PANTOPRAZOLE 40 MG TABLET,DELAYED RELEASE: ORAL | 30 days supply | Qty: 30 | Fill #0

## 2019-10-11 MED FILL — ACCU-CHEK GUIDE TEST STRIPS: 25 days supply | Qty: 100 | Fill #0 | Status: AC

## 2019-10-11 MED FILL — MYCOPHENOLATE SODIUM 180 MG TABLET,DELAYED RELEASE: ORAL | 30 days supply | Qty: 180 | Fill #0

## 2019-10-11 MED FILL — PREDNISONE 20 MG TABLET: 30 days supply | Qty: 60 | Fill #0 | Status: AC

## 2019-10-11 MED FILL — ACCU-CHEK GUIDE TEST STRIPS: 25 days supply | Qty: 100 | Fill #0

## 2019-10-11 MED FILL — PANTOPRAZOLE 40 MG TABLET,DELAYED RELEASE: 30 days supply | Qty: 30 | Fill #0 | Status: AC

## 2019-10-11 MED FILL — AQUADEKS 100 MCG-350 MCG-5 MG CHEWABLE TABLET: ORAL | 30 days supply | Qty: 60 | Fill #0

## 2019-10-11 MED FILL — AQUADEKS 100 MCG-350 MCG-5 MG CHEWABLE TABLET: 30 days supply | Qty: 60 | Fill #0 | Status: AC

## 2019-10-11 MED FILL — MYCOPHENOLATE SODIUM 180 MG TABLET,DELAYED RELEASE: 30 days supply | Qty: 180 | Fill #0 | Status: AC

## 2019-10-11 MED FILL — VALGANCICLOVIR 450 MG TABLET: 30 days supply | Qty: 30 | Fill #0 | Status: AC

## 2019-10-11 NOTE — Unmapped (Signed)
Hospice orders signed by Dr. Jessee Avers and faxed to [facility] at [phone] by our office staff.

## 2019-10-12 LAB — TACROLIMUS, TROUGH: Lab: 4.2

## 2019-10-12 NOTE — Unmapped (Signed)
Error

## 2019-10-14 LAB — FSAB CLASS 1 ANTIBODY SPECIFICITY

## 2019-10-14 LAB — HLA CL2 AB RESULT: Lab: POSITIVE

## 2019-10-14 LAB — HLA CLASS 1 ANTIBODY

## 2019-10-14 LAB — FSAB CLASS 2 ANTIBODY SPECIFICITY

## 2019-10-14 NOTE — Unmapped (Signed)
[facility] Clinical Assessment & Refill Coordination Note    RODERT HINCH, DOB: [birth year]  Phone: [phone] (home)     All above HIPAA information was verified with patient's family member, mother.     Was a Nurse, learning disability used for this call? No    Specialty Medication(s):   Transplant:  mycophenolic acid 180mg , tacrolimus 1mg , valgancyclovir 450mg , Prednisone 20mg  and xifaxan 550mg      Current Outpatient Medications   Medication Sig Dispense Refill   ??? blood sugar diagnostic Strp Use to check blood sugar four times daily as directed. 100 strip 12   ??? blood-glucose meter kit Use as instructed 1 each 0   ??? calcium carbonate (OS-CAL) 1,500 mg (600 mg elem calcium) tablet Take 1/2 tablet (300 mg elem calcium) by mouth Two (2) times a day. (Over the counter med) 60 tablet 0   ??? cholecalciferol, vitamin D3, 125 mcg (5,000 unit) tablet Take 1 tablet (5,000 Units total) by mouth daily. 30 tablet 11   ??? clindamycin-benzoyl peroxide 1.2 %(1 % base) -5 % gel Apply to rash on chest (Patient taking differently: Apply 1 application topically nightly. Apply to rash on chest) 45 g 0   ??? glucagon 0.5 mg/0.1 mL Syrg Inject 0.1 mL under the skin once as needed for up to 1 dose. 0.2 mL 2   ??? glucose 4 GM chewable tablet Chew 1 tablet (4 g total) 4 (four) times a day as needed for low blood sugar (refer to hypoglycemia guideline). 50 tablet 12   ??? insulin glargine (LANTUS) 100 unit/mL injection Inject 0.05 mL (5 Units total) under the skin nightly. 10 mL 0   ??? insulin lispro (HUMALOG) 100 unit/mL injection Inject 0.02-0.04 mL (2-4 Units total) under the skin Four (4) times a day (before meals and nightly). 10 mL 0   ??? insulin syr/ndl U100 half mark (BD INSULIN SYRINGE HALF UNIT) 0.3 mL 31 gauge x 5/16 Syrg Use to inject insulins up to 6 times daily as directed. 200 each 12 ??? lactulose (CHRONULAC) 10 gram/15 mL solution Take 30 mL (20 g total) by mouth daily as needed (for constipation). 240 mL 0   ??? lancets Misc Use to check blood glucose 6 times per day. 204 each 0   ??? magnesium oxide (MAG-OX) 400 mg (241.3 mg magnesium) tablet Take 1 tablet (400 mg total) by mouth daily. 30 tablet 3   ??? melatonin 3 mg Tab Take 1 tablet (3 mg total) by mouth every evening. (Patient taking differently: Take 3 mg by mouth nightly as needed. ) 60 tablet 0   ??? multivitamin, with zinc (AQUADEKS) 100-5 mcg-mg Chew Chew 2 tablets daily. 60 tablet 0   ??? mycophenolate (MYFORTIC) 180 MG EC tablet Take 3 tablets (540 mg total) by mouth Two (2) times a day. 180 tablet 11   ??? ondansetron (ZOFRAN-ODT) 4 MG disintegrating tablet Take 1 tablet (4 mg total) by mouth every eight (8) hours as needed for up to 7 days. 10 tablet 0   ??? pantoprazole (PROTONIX) 40 MG tablet Take 1 tablet (40 mg total) by mouth daily. 30 tablet 0   ??? predniSONE (DELTASONE) 20 MG tablet Take 2 tablets (40 mg total) by mouth daily. 60 tablet 0   ??? rifAXIMin (XIFAXAN) 550 mg Tab Take 1 tablet (550 mg total) by mouth Two (2) times a day. 60 tablet 4   ??? sulfamethoxazole-trimethoprim (BACTRIM) 400-80 mg per tablet Take 1 tablet (80 mg of trimethoprim total)  by mouth Every Monday, Wednesday, and Friday. 12 tablet 0   ??? tacrolimus (PROGRAF) 1 MG capsule Take 2 capsules (2 mg total) by mouth two (2) times a day. (Patient taking differently: Take 1 mg by mouth two (2) times a day. ) 120 capsule 0   ??? valGANciclovir (VALCYTE) 450 mg tablet Take 1 tablet (450 mg total) by mouth daily. 30 tablet 4     No current facility-administered medications for this visit.         Changes to medications: Kaelon reports no changes at this time.    Allergies   Allergen Reactions   ??? Bee Pollen Rash     Pt has seasonal allergies that cause excessive sneezing and running nose- [name], NAII   ??? Pollen Extracts Rash Pt has seasonal allergies that cause excessive sneezing and running nose- [name], NAII       Changes to allergies: No    SPECIALTY MEDICATION ADHERENCE     Mycophenolate 180mg   : 1 month of medicine on hand   Prednisone 20mg   : 1 month of medicine on hand   valganciclovir 450mg   : 1 month of medicine on hand   xifaxan 550mg   : 1 month of medicine on hand   Tacrolimus 1mg   : 3 weeks of medicine on hand     Medication Adherence    Patient reported X missed doses in the last month: 0  Specialty Medication: mycophenolate 180mg   Patient is on additional specialty medications: Yes  Additional Specialty Medications: Tacrolimus 1mg   Patient Reported Additional Medication X Missed Doses in the Last Month: 0  Patient is on more than two specialty medications: Yes  Specialty Medication: prednisone 20mg   Patient Reported Additional Medication X Missed Doses in the Last Month: 0  Specialty Medication: valganciclovir 450mg   Patient Reported Additional Medication X Missed Doses in the Last Month: 0  Specialty Medication: xifaxan 550mg   Patient Reported Additional Medication X Missed Doses in the Last Month: 0          Specialty medication(s) dose(s) confirmed: Regimen is correct and unchanged.     Are there any concerns with adherence? No    Adherence counseling provided? Not needed    CLINICAL MANAGEMENT AND INTERVENTION      Clinical Benefit Assessment:    Do you feel the medicine is effective or helping your condition? Yes    Clinical Benefit counseling provided? Not needed    Adverse Effects Assessment:    Are you experiencing any side effects? mom states pt is still having frequent bowel movements. she has talked to hospice nurse about it and they said as long as it wasn't diarrhea it was ok. mom cannot personally verify but says pt says it is not diarrhea. he has appt next week. i will reach out to clinic to see if they need to talk with pt sooner    Are you experiencing difficulty administering your medicine? No Quality of Life Assessment:    How many days over the past month did your transplant  keep you from your normal activities? For example, brushing your teeth or getting up in the morning. 0    Have you discussed this with your provider? Not needed    Therapy Appropriateness:    Is therapy appropriate? Yes, therapy is appropriate and should be continued    DISEASE/MEDICATION-SPECIFIC INFORMATION      N/A    PATIENT SPECIFIC NEEDS     ? Does the patient have any physical, cognitive,  or cultural barriers? No    ? Is the patient high risk? Yes, patient is taking a REMS drug. Medication is dispensed in compliance with REMS program.     ? Does the patient require a Care Management Plan? No     ? Does the patient require physician intervention or other additional services (i.e. nutrition, smoking cessation, social work)? No      SHIPPING     Specialty Medication(s) to be Shipped:   na    Other medication(s) to be shipped: na     Changes to insurance: No    Delivery Scheduled: Patient declined refill at this time due to mom wants call back in 2 weeks to try to get all meds together.     Medication will be delivered via na to the confirmed na address in [facility].    The patient will receive a drug information handout for each medication shipped and additional FDA Medication Guides as required.  Verified that patient has previously received a Conservation officer, historic buildings.    All of the patient's questions and concerns have been addressed.    Thad Ranger   [facility] Specialty Pharmacist

## 2019-10-17 ENCOUNTER — Other Ambulatory Visit (HOSPITAL_COMMUNITY)
Admission: AD | Admit: 2019-10-17 | Discharge: 2019-10-17 | Disposition: A | Discharge status: Home / Self Care | Payer: Medicaid Other | Source: Ambulatory Visit | Attending: Pediatric Gastroenterology | Admitting: Pediatric Gastroenterology

## 2019-10-17 DIAGNOSIS — Z944 Liver transplant status: Secondary | ICD-10-CM | POA: Insufficient documentation

## 2019-10-17 DIAGNOSIS — Z79899 Other long term (current) drug therapy: Secondary | ICD-10-CM | POA: Insufficient documentation

## 2019-10-17 LAB — COMPREHENSIVE METABOLIC PANEL
ALKALINE PHOSPHATASE: 1644 U/L — ABNORMAL HIGH
ALT (SGPT): 380 U/L — ABNORMAL HIGH
ALT: 380 U/L — ABNORMAL HIGH (ref 0–44)
AST (SGOT): 339 U/L — ABNORMAL HIGH
AST: 339 U/L — ABNORMAL HIGH (ref 15–41)
Albumin: 2.2 g/dL — ABNORMAL LOW (ref 3.5–5.0)
Alkaline Phosphatase: 1644 U/L — ABNORMAL HIGH (ref 38–126)
Anion gap: 12 (ref 5–15)
BILIRUBIN TOTAL: 46.6 mg/dL
BLOOD UREA NITROGEN: 22 mg/dL — ABNORMAL HIGH
BUN: 22 mg/dL — ABNORMAL HIGH (ref 6–20)
CALCIUM: 8.5 mg/dL — ABNORMAL LOW
CHLORIDE: 108 mmol/L
CO2: 16 mmol/L — ABNORMAL LOW (ref 22–32)
CO2: 16 mmol/L — ABNORMAL LOW
CREATININE: 1.15 mg/dL
Calcium: 8.5 mg/dL — ABNORMAL LOW (ref 8.9–10.3)
Chloride: 108 mmol/L (ref 98–111)
Creatinine, Ser: 1.15 mg/dL (ref 0.61–1.24)
GFR calc Af Amer: >60 mL/min (ref >60)
GFR calc non Af Amer: >60 mL/min (ref >60)
GLUCOSE RANDOM: 109 mg/dL — ABNORMAL HIGH
Glucose, Bld: 109 mg/dL — ABNORMAL HIGH (ref 70–99)
POTASSIUM: 2.8 mmol/L — ABNORMAL LOW
PROTEIN TOTAL: 5.4 g/dL — ABNORMAL LOW
Potassium: 2.8 mmol/L — ABNORMAL LOW (ref 3.5–5.1)
Sodium: 136 mmol/L (ref 135–145)
Total Bilirubin: 46.6 mg/dL (ref 0.3–1.2)
Total Protein: 5.4 g/dL — ABNORMAL LOW (ref 6.5–8.1)

## 2019-10-17 LAB — CBC WITH DIFFERENTIAL/PLATELET
Abs Immature Granulocytes: 0.48 10*3/uL — ABNORMAL HIGH (ref 0.00–0.07)
Basophils Absolute: 0 10*3/uL (ref 0.0–0.1)
Basophils Relative: 0 %
Eosinophils Absolute: 0 10*3/uL (ref 0.0–0.5)
Eosinophils Relative: 0 %
HCT: 24.5 % — ABNORMAL LOW (ref 39.0–52.0)
Hemoglobin: 9.2 g/dL — ABNORMAL LOW (ref 13.0–17.0)
Immature Granulocytes: 6 %
Lymphocytes Relative: 9 %
Lymphs Abs: 0.7 10*3/uL (ref 0.7–4.0)
MCH: 28.1 pg (ref 26.0–34.0)
MCHC: 37.6 g/dL — ABNORMAL HIGH (ref 30.0–36.0)
MCV: 74.9 fL — ABNORMAL LOW (ref 80.0–100.0)
Monocytes Absolute: 0.8 10*3/uL (ref 0.1–1.0)
Monocytes Relative: 10 %
Neutro Abs: 6.2 10*3/uL (ref 1.7–7.7)
Neutrophils Relative %: 75 %
Platelets: 204 10*3/uL (ref 150–400)
RBC: 3.27 MIL/uL — ABNORMAL LOW (ref 4.22–5.81)
RDW: 26.4 % — ABNORMAL HIGH (ref 11.5–15.5)
WBC: 8.3 10*3/uL (ref 4.0–10.5)
nRBC: 0 % (ref 0.0–0.2)

## 2019-10-17 LAB — PHOSPHORUS
Lab: 4.5
Phosphorus: 4.5 mg/dL (ref 2.5–4.6)

## 2019-10-17 LAB — PROTIME-INR
INR: 1.3 — ABNORMAL HIGH (ref 0.8–1.2)
Prothrombin Time: 16.4 seconds — ABNORMAL HIGH (ref 11.4–15.2)

## 2019-10-17 LAB — MAGNESIUM
Lab: 1.4 — ABNORMAL LOW
Magnesium: 1.4 mg/dL — ABNORMAL LOW (ref 1.7–2.4)

## 2019-10-17 LAB — GAMMA GT: GGT: 518 U/L — ABNORMAL HIGH (ref 7–50)

## 2019-10-17 LAB — CBC W/ DIFFERENTIAL
BASOPHILS ABSOLUTE COUNT: 0 10*9/L
EOSINOPHILS ABSOLUTE COUNT: 0 10*9/L
HEMATOCRIT: 24.5 % — ABNORMAL LOW
HEMOGLOBIN: 9.2 g/dL — ABNORMAL LOW
LYMPHOCYTES ABSOLUTE COUNT: 0.7 10*9/L
MONOCYTES ABSOLUTE COUNT: 0.8 10*9/L
NEUTROPHILS ABSOLUTE COUNT: 6.2 10*9/L
WBC ADJUSTED: 8.3 10*9/L

## 2019-10-17 LAB — ALBUMIN: Lab: 2.2 — ABNORMAL LOW

## 2019-10-17 LAB — PROTIME: Lab: 16.4 — ABNORMAL HIGH

## 2019-10-17 LAB — BILIRUBIN DIRECT: Lab: 0

## 2019-10-17 LAB — GAMMA GLUTAMYL TRANSFERASE: Lab: 518 — ABNORMAL HIGH

## 2019-10-17 LAB — RED CELL DISTRIBUTION WIDTH: Lab: 0

## 2019-10-17 LAB — BLOOD UREA NITROGEN: Lab: 22 — ABNORMAL HIGH

## 2019-10-17 NOTE — Unmapped (Signed)
Voice mail received from pt's mother, reporting that pt was stressed by pain of frequent fingersticks for blood glucose monitoring.  I was also forwarded a message reporting that he had c/o frequent daily bowel movements, 5-8 times daily.  I called and spoke to [name] Gross first; he endorsed c/o finger pain.  I advised checking BG at least once daily and that I would speak to MD re: ongoing prednisone dosing.  I informed him that the transplant committee recently discussed his case and expressed concern for frailty and nutrition.  I spoke afterwards to his mother also, at pt's request, and relayed the same as above.  Further reviewed that a social work up date has been scheduled and that we would refer to endocrinology, whom pt is already followed by for glucose management, re: bone density scan findings indicating osteoporosis.  Per his mother, [name] Gross is eating (including dairy products) and drinking well and doing breathing exercises.  He has been applying for disability.      He had labs drawn today; I notified Dr. Melrose Gross and [name] Gross (pt is between management by pediatrics and adult hepatology) of K 2.8.

## 2019-10-18 DIAGNOSIS — T8641 Liver transplant rejection: Principal | ICD-10-CM

## 2019-10-18 DIAGNOSIS — K7290 Hepatic failure, unspecified without coma: Principal | ICD-10-CM

## 2019-10-18 DIAGNOSIS — Z01818 Encounter for other preprocedural examination: Principal | ICD-10-CM

## 2019-10-18 DIAGNOSIS — T8642 Liver transplant failure: Principal | ICD-10-CM

## 2019-10-18 DIAGNOSIS — E876 Hypokalemia: Principal | ICD-10-CM

## 2019-10-18 DIAGNOSIS — Z944 Liver transplant status: Principal | ICD-10-CM

## 2019-10-18 DIAGNOSIS — R609 Edema, unspecified: Principal | ICD-10-CM

## 2019-10-18 LAB — TACROLIMUS LEVEL: Tacrolimus (FK506) - LabCorp: 4.8 ng/mL (ref 2.0–20.0)

## 2019-10-18 LAB — TACROLIMUS, TROUGH: Lab: 4.8

## 2019-10-18 MED ORDER — SPIRONOLACTONE 50 MG TABLET
ORAL_TABLET | Freq: Every day | ORAL | 11 refills | 30 days | Status: CP
Start: 2019-10-18 — End: 2020-10-17

## 2019-10-18 MED ORDER — PREDNISONE 5 MG TABLET
ORAL_TABLET | Freq: Every day | ORAL | 3 refills | 30.00000 days | Status: CP
Start: 2019-10-18 — End: ?

## 2019-10-18 NOTE — Unmapped (Signed)
Called and spoke to pt's mother; relayed that per Dr. Melrose Nakayama, will taper prednisone to 15 mg daily and start aldactone.  Scripts sent to [facility].

## 2019-10-19 ENCOUNTER — Encounter: Admit: 2019-10-19 | Discharge: 2019-10-20 | Payer: PRIVATE HEALTH INSURANCE

## 2019-10-19 DIAGNOSIS — R52 Pain, unspecified: Principal | ICD-10-CM

## 2019-10-19 NOTE — Unmapped (Signed)
-----   Message from [facility] sent at 10/19/2019  2:09 PM EST -----  Regarding: Info  Contact: Earleen Newport [phone]  Please call to discuss the patient

## 2019-10-19 NOTE — Unmapped (Signed)
MD orders signed by Dr. Jessee Avers and faxed to [facility] at [phone].

## 2019-10-20 ENCOUNTER — Ambulatory Visit: Admit: 2019-10-20 | Discharge: 2019-10-21 | Payer: PRIVATE HEALTH INSURANCE | Attending: Family | Primary: Family

## 2019-10-20 NOTE — Unmapped (Signed)
Pain in lower right back.  Mother reported patient thinks it is from sleeping wrong.  Stated that she tried to pick up the aldactone yesterday but unable to due to the insurance denying due to change in patient hospice status,.  Mother unsure if patient on hospice or palliative care and would like a call to clarify.  Messaged Primary coordinator to make aware.    Denied any current fever.  Let her know patient is safe to try Tylenol (max 3 grams in 24 hours).  Also let her know patient could try a heating pad and massage to help with the pain too.

## 2019-10-20 NOTE — Unmapped (Signed)
[facility]   Department of Pediatric Hematology/Oncology  Thrombosis Clinic   Date: 10/20/19  Patient Name: [name] Gross  MRN: [id number]  PCP: [name], MD  Referring Physician: Referred Self  Merrily Pew,  [place]  Visit Type: Thrombosis Clinic  Reason for visit: Deep vein thrombosis    This visit was conducted by Virtual Video Visit  I identified myself to the patient and conveyed my credentials.  Is there anyone else in room with patient? Yes. What is your relationship? Mother. Do you want this person here for the visit? Yes.    In case we get disconnected, patient's phone number is [phone] (home)      This visit was conducted by telephone visit. Patient was counseled on the relative limitations and risks of telemedicine visit. Patient understood and consented to continuing. Patient is aware that this visit will be billed appropriately.     In person visit was not conducted secondary to the global covid-19 pandemic and departmental, institutional and national guidelines at this time. These guidelines state patients should only be seen in person for urgent/emergent needs. Based on my clinical assessment and available data at the time of this visit the patient did not qualify as urgent/emergent.     I am located on-site and the patient is located off-site for this visit.      Assessment:      [name] Gross  is a 18 y.o. 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  Patient presents to peds hematology clinic for virtual outpatient visit. [name] Gross suffered a provoked episode of VTE but he was asymptomatic and it was unclear when the thrombus developed as it was an incidental finding in light of global edema. We chose to observe and not treat for the following reasons: 1. Pt was asymptomatic with no pain, discoloration or swelling to RUE. 2. Unclear age of the DVT considering patient was not truly symptomatic.3. Patient's overall risk for potential bleeding considering his liver related coagulopathy and presence of esophageal varices.    I had a long discussion with patient today regarding the approach to evaluation of VTE and the rationale for my recommendations. See initial inpatient consult note on 09/30/2019.     VTE Diagnosis:    Deep vein thrombosis diagnosed based on Upper extremity dopplers dated 09/19/2019.     09/19/2019: Right: Evidence of acute obstruction is visualized in the axillary vein, brachial vein at prox to mid upper arm, and basilic vein at focal -- origin of basilic vein. All other venous findings in the upper extremity are within normal limits. Left: No evidence of obstruction was seen in the central veins or arm veins.    09/22/2019: Right: Evidence of continued obstruction is visualized in the axillary vein, and brachial vein at 1 of 2 proximal to mid upper arm. All other venous findings in the upper extremity are within normal limits. Findings appear essentially unchanged as compared to prior exam of 09/19/2019. Left: No evidence of obstruction was seen in the central veins.    10/19/2019: Right: Evidence of chronic obstruction visualized in the axillary vein. Evidence of continued obstruction is visualized in the brachial vein. All other venous findings in the upper extremity are within normal limits. Left: No evidence of obstruction was seen in the central veins. -verbally spoke with registered vascular technician Elder Negus  Noralyn Pick to request a comparison of the 12/9 imaging vs the 11/12 imaging and she states the repeat imaging 12/9 is stable compared to the last exam and the brachial vein thrombus has not grown.    VTE risk factors in [name] Gross include previous PICC line to RUE that was removed 10/31 and slightly low protein C/S and AT3 and with given risk factors is considered at AT risk.    Work-up:  A thrombophilia evaluation has not been pursued.      However, protein C, protein S and AT3 activity were obtained inpatient that were all slightly low. Likely this is r/t his liver dysfunction.     D-dimer peak on 11/10 at 713 (3.1xULN). This was last d-dimer value.    Treatment: [name] Gross  was initially started on therapeutic anticoagulation with low-molecular weight heparin/enoxaparin (Lovenox) for one dose but upon further assessment we recommended NOT continuing on anticoagulation given asymptomatic for DVT, unclear how long this thrombus has been present, and concerns for bleeding risk r/t liver dysfunction. Watchful waiting with repeat imaging on 11/12 showed no change in clot size.    Pt had repeat imaging 10/19/2019. RUE venous PVL showed evidence of chronic obstruction visualized in the axillary vein. Evidence of continued obstruction is visualized in the brachial vein. All other venous findings in the upper extremity are within normal limits.    Discussed with patient and family member the signs and symptoms of VTE including pain, swelling, discoloration and warmth for extremities and/or chest pain, SOB, unexplained cough or heart rate. Discussed also risk factors for clot formation including: immobility, surgery and trauma, increased estrogen exposure, certain medical conditions including cancer, heart failure, inflammatory disorders, or nephrotic syndrome, as well as other risk factors including prior clot, family history of clots particularly in first degree family members, inherited or acquired thrombophilias, obesity, older age, cigarette smoking or varicose veins.     Reminded patient to stay hydrated and to move every 2-4 hours while traveling. When flying should try to sit in an aisle seat.     Educational handout provided from [email or web address].       Patient Active Problem List   Diagnosis   ??? Adjustment disorder with anxious mood   ??? Recurrent major depressive disorder, in partial remission (CMS-HCC)   ??? Malnutrition of mild degree (CMS-HCC)   ??? History of liver transplant (CMS-HCC)   ??? Allergic rhinitis   ??? Transaminitis   ??? History of suicide attempt   ??? 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 Acute rejection of liver transplant (CMS-HCC)   ??? Ingestion of multiple medications   ??? Liver transplant rejection (CMS-HCC)   ??? Normocytic anemia   ??? AKI (acute kidney injury) (CMS-HCC)   ??? Electrolyte disturbance   ??? Hypoalbuminemia   ??? H/O liver transplant (CMS-HCC)   ??? 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 High serum gamma glutamyl transferase (GGT)   ??? Steroid-induced hyperglycemia   ??? Pityrosporum folliculitis   ??? Bilateral knee swelling   ??? Homeless   ??? Thrombocytopenia (CMS-HCC)   ??? Hypomagnesemia   ??? Hypophosphatasia   ??? Other ascites   ??? Hyperbilirubinemia   ??? Impetigo lesions of Right Ear and Posterior Neck   ??? Steroid-induced acne   ??? 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 Irritant contact dermatitis of the hands   ??? Sleep difficulties   ??? Fever  Plan: # VTE: Pt continues to be asymptomatic and RUE PVL shows evidence of chronic obstruction visualized in the axillary vein and continued obstruction in brachial vein that is essentially unchanged from previous imaging according to Raeanne Barry RVT. All other venous findings in the upper extremity are within normal limits.    -in light of pt remaining asymptomatic and imaging showing chronic clot and unchanged findings in brachial vein, do not think anticoagulation is needed at this time. See below for counseling.    #Counseling:  -If patient is ever hospitalized and needs central access or is hospitalized and critically ill, we recommend a hematology consult so we can assess the risk/benefit ratio of ppx anticoagulation  -Reviewed signs and symptoms that would warrant call back or return to clinic including: pain/swelling/discoloration of extremities  -Reviewed signs and symptoms that would warrant emergency care including: chest pain, palpitations, shortness of breath and/or cough  -Reviewed basic DVT/PE education. Reinforced that PE are life threatening and can be fatal if not treated.  -[employer] handout given regarding VTE education  -Reviewed risk factors for clotting including but not limited to:  immobility, surgery and trauma, increased estrogen exposure, certain medical conditions including cancer, heart failure, inflammatory disorders, or nephrotic syndrome, as well as other risk factors including prior clot, family history of clots particularly in first degree family members, inherited or acquired thrombophilias, obesity, older age, cigarette smoking or varicose veins.   -Recommend no smoking as smoking can increase likelihood for clot development    # Dispo: RTC in 6mos for virtual visit or sooner if concerns arise    Family knows to contact us with any questions or concerns.        Subjective: HPI: [name] Gross  is a 18 y.o.  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    Medical records reviewed from Epic and history obtained from patient.  Seen as an inpatient consult on 11/10.    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 He was admitted to the PICU on??09/18/2019??for concern for spontaneous bacterial peritonitis with fever, fatigue, and altered mental status. Was found to have RUE DVT on PVLs 11/9. PVLs were obtained because of global edema but pt was not symptomatic for RUE clot at the time (denied pain, redness or swelling). Unclear if clot is new or old (had a previous PICC to RUE that was removed 10/31). Watchful waiting was advised with repeat US on 11/12 that was essentially unchanged. Pt continued to remain asymptomatic from a thrombus perspective and was discharged 09/24/2019 with follow up outpatient to our clinic.     Interval:  Had repeat RUE PVL 12/9 which showed evidence of chronic obstruction visualized in the axillary vein. Evidence of continued obstruction is visualized in the brachial vein. All other venous findings in the upper extremity are within normal limits. Spoke with registered vascular technician who performed his case Raeanne Barry who informed this provider that his obstruction in the brachial vein is stable from his last exam a month ago and has not grown.    Overall pt is feeling well. Denies redness, pain or swelling to RUE. Denies CP, palpitations or SOB. Mom endorses pt has a sporadic dry cough that happens 1x per day on average. She thinks it's due to the cold air and has put a  humidifier in his room. Is having some back pain, talked to his transplant coordinator about it and is taking tylenol as needed. Denies recent hospitalizations or ER visits since last admission at [facility]. Denies fevers or other URI symptoms.     Does not have any form of central access.       Past Medical History:  Past Medical History:   Diagnosis Date   ??? 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 Depression    ??? MSSA bacteremia 08/27/2019   ??? Seasonal allergies    ??? Sickle cell trait (CMS-HCC)    ??? Strep Mitis Central line-associated bloodstream infection 08/27/2019     Immunization History   Administered Date(s) Administered   ??? DTaP 06/12/2000, 10/13/2000, 01/06/2001, 08/16/2001, 06/24/2004   ??? HPV Quadrivalent (Gardasil) 04/17/2011, 05/27/2012, 05/11/2013   ??? Hepatitis A 09/11/2006, 01/23/2009   ??? Hepatitis B vaccine, pediatric/adolescent dosage, 2000-04-09, 04/24/2000, 01/06/2001   ??? HiB-PRP-OMP 06/12/2000, 10/13/2000, 01/06/2001, 05/18/2001   ??? Influenza LAIV (Nasal-Quad) 2-28yrs 09/14/2014   ??? Influenza Vaccine Quad (IIV4 PF) 90mo+ injectable 10/12/2017, 09/10/2019   ??? MMR 05/18/2001, 06/24/2004   ??? Meningococcal Conjugate MCV4P 04/17/2011, 05/22/2016   ??? PPD Test 05/17/2016   ??? Pneumococcal Conjugate 13-Valent 06/12/2000, 10/13/2000, 01/06/2001   ??? Poliovirus,inactivated (IPV) 06/12/2000, 10/13/2000, 05/18/2001, 06/24/2004   ??? TdaP 04/17/2011   ??? Varicella 05/18/2001, 07/24/2005, 09/11/2006     Development History: developmentally appropriate  Immunization History: immunizations not reviewed this visit    Past Surgical History:  Past Surgical History:   Procedure Laterality Date   ??? FRENULECTOMY, LINGUAL     ??? PR ERCP REMOVE FOREIGN BODY/STENT BILIARY/PANC DUCT N/A 08/20/2017 Procedure: ENDOSCOPIC RETROGRADE CHOLANGIOPANCREATOGRAPHY (ERCP); W/ REMOVAL OF FOREIGN BODY/STENT FROM BILIARY/PANCREATIC DUCT(S);  Surgeon: Vonda Antigua, MD;  Location: GI PROCEDURES [facility];  Service: Gastroenterology   ??? PR ERCP REMOVE FOREIGN BODY/STENT BILIARY/PANC DUCT N/A 06/04/2018    Procedure: ENDOSCOPIC RETROGRADE CHOLANGIOPANCREATOGRAPHY (ERCP); W/ REMOVAL OF FOREIGN BODY/STENT FROM BILIARY/PANCREATIC DUCT(S);  Surgeon: Chriss Driver, MD;  Location: GI PROCEDURES [facility];  Service: Gastroenterology   ??? PR ERCP STENT PLACEMENT BILIARY/PANCREATIC DUCT N/A 11/21/2016    Procedure: ENDOSCOPIC RETROGRADE CHOLANGIOPANCREATOGRAPHY (ERCP); WITH PLACEMENT OF ENDOSCOPIC STENT INTO BILIARY OR PANCREATIC DUCT;  Surgeon: Vonda Antigua, MD;  Location: GI PROCEDURES [facility];  Service: Gastroenterology   ??? PR ERCP,W/REMOVAL STONE,BIL/PANCR DUCTS N/A 08/20/2017    Procedure: ERCP; W/ENDOSCOPIC RETROGRADE REMOVAL OF CALCULUS/CALCULI FROM BILIARY &/OR PANCREATIC DUCTS;  Surgeon: Vonda Antigua, MD;  Location: GI PROCEDURES [facility];  Service: Gastroenterology   ??? PR ERCP,W/REMOVAL STONE,BIL/PANCR DUCTS N/A 06/04/2018    Procedure: ERCP; W/ENDOSCOPIC RETROGRADE REMOVAL OF CALCULUS/CALCULI FROM BILIARY &/OR PANCREATIC DUCTS;  Surgeon: Chriss Driver, MD;  Location: [facility]URES [facility];  Service: Gastroenterology   ??? PR TRANSPLANT LIVER,ALLOTRANSPLANT N/A 09/22/2016    Procedure: LIVER ALLOTRANSPLANTATION; ORTHOTOPIC, PARTIAL OR WHOLE, FROM CADAVER OR LIVING DONOR, ANY AGE;  Surgeon: Doyce Loose, MD;  Location: MAIN OR [facility];  Service: Transplant   ??? PR TRANSPLANT,PREP DONOR LIVER, WHOLE N/A 09/22/2016    Procedure: [facility] STD PREP CAD DONOR WHOLE LIVER GFT PRIOR TNSPLNT,INC CHOLE,DISS/REM SURR TISSU WO TRISEG/LOBE SPLT;  Surgeon: Doyce Loose, MD;  Location: MAIN OR [facility];  Service: Transplant   ??? PR UPGI ENDOSCOPY W/US FN BX N/A 08/20/2017  Procedure: UGI W/ TRANSENDOSCOPIC ULTRASOUND GUIDED INTRAMURAL/TRANSMURAL FINE NEEDLE ASPIRATION/BIOPSY(S), ESOPHAGUS;  Surgeon: Vonda Antigua, MD;  Location: GI PROC[facility]URES [facility];  Service: Gastroenterology   ??? PR UPPER GI ENDOSCOPY,BIOPSY N/A 07/15/2016    Procedure: UGI ENDOSCOPY; WITH BIOPSY, SINGLE OR MULTIPLE;  Surgeon: Arnold Long Mir, MD;  Location: PEDS PROCEDURE ROOM [facility];  Service: Gastroenterology   ??? PR UPPER GI ENDOSCOPY,CTRL BLEED Left 05/05/2016    Procedure: UGI ENDOSCOPY; WITH CONTROL OF BLEEDING, ANY METHOD;  Surgeon: Arnold Long Mir, MD;  Location: PEDS PROCEDURE ROOM [facility];  Service: Gastroenterology   ??? PR UPPER GI ENDOSCOPY,CTRL BLEED N/A 05/12/2016    Procedure: UGI ENDOSCOPY; WITH CONTROL OF BLEEDING, ANY METHOD;  Surgeon: Annie Paras, MD;  Location: CHILDRENS OR [facility];  Service: Gastroenterology   ??? PR UPPER GI ENDOSCOPY,DIAGNOSIS N/A 09/21/2019    Procedure: UGI ENDO, INCLUDE ESOPHAGUS, STOMACH, & DUODENUM &/OR JEJUNUM; DX W/WO COLLECTION SPECIMN, BY BRUSH OR WASH;  Surgeon: Monte Fantasia, MD;  Location: GI PROCEDURES [facility];  Service: Gastroenterology       Family History:  Family History   Problem Relation Age of Onset   ??? Diabetes Maternal Grandmother    ??? Diabetes Paternal Grandmother    ??? Hypertension Maternal Grandfather    ??? Hypertension Paternal Grandfather    ??? Asthma Brother    ??? Anesthesia problems Neg Hx    ??? Melanoma Neg Hx    ??? Basal cell carcinoma Neg Hx    ??? Squamous cell carcinoma Neg Hx      [place] does not have a VTE history in the family.    Social History:  Social History     Social History Narrative    Lives part time with mother and part time with [employer].        Medications:    Current Outpatient Medications   Medication Sig Dispense Refill   ??? blood sugar diagnostic Strp Use to check blood sugar four times daily as directed. 100 strip 12   ??? blood-glucose meter kit Use as instructed 1 each 0 ??? calcium carbonate (OS-CAL) 1,500 mg (600 mg elem calcium) tablet Take 1/2 tablet (300 mg elem calcium) by mouth Two (2) times a day. (Over the counter med) 60 tablet 0   ??? cholecalciferol, vitamin D3, 125 mcg (5,000 unit) tablet Take 1 tablet (5,000 Units total) by mouth daily. 30 tablet 11   ??? clindamycin-benzoyl peroxide 1.2 %(1 % base) -5 % gel Apply to rash on chest (Patient taking differently: Apply 1 application topically nightly. Apply to rash on chest) 45 g 0   ??? glucagon 0.5 mg/0.1 mL Syrg Inject 0.1 mL under the skin once as needed for up to 1 dose. 0.2 mL 2   ??? glucose 4 GM chewable tablet Chew 1 tablet (4 g total) 4 (four) times a day as needed for low blood sugar (refer to hypoglycemia guideline). 50 tablet 12   ??? insulin glargine (LANTUS) 100 unit/mL injection Inject 0.05 mL (5 Units total) under the skin nightly. 10 mL 0   ??? insulin lispro (HUMALOG) 100 unit/mL injection Inject 0.02-0.04 mL (2-4 Units total) under the skin Four (4) times a day (before meals and nightly). 10 mL 0   ??? insulin syr/ndl U100 half mark (BD INSULIN SYRINGE HALF UNIT) 0.3 mL 31 gauge x 5/16 Syrg Use to inject insulins up to 6 times daily as directed. 200 each 12   ??? lactulose (CHRONULAC) 10 gram/15 mL solution Take 30  mL (20 g total) by mouth daily as needed (for constipation). 240 mL 0   ??? lancets Misc Use to check blood glucose 6 times per day. 204 each 0   ??? magnesium oxide (MAG-OX) 400 mg (241.3 mg magnesium) tablet Take 1 tablet (400 mg total) by mouth daily. 30 tablet 3   ??? melatonin 3 mg Tab Take 1 tablet (3 mg total) by mouth every evening. (Patient taking differently: Take 3 mg by mouth nightly as needed. ) 60 tablet 0   ??? multivitamin, with zinc (AQUADEKS) 100-5 mcg-mg Chew Chew 2 tablets daily. 60 tablet 0   ??? mycophenolate (MYFORTIC) 180 MG EC tablet Take 3 tablets (540 mg total) by mouth Two (2) times a day. 180 tablet 11 ??? ondansetron (ZOFRAN-ODT) 4 MG disintegrating tablet Take 1 tablet (4 mg total) by mouth every eight (8) hours as needed for up to 7 days. 10 tablet 0   ??? pantoprazole (PROTONIX) 40 MG tablet Take 1 tablet (40 mg total) by mouth daily. 30 tablet 0   ??? predniSONE (DELTASONE) 5 MG tablet Take 3 tablets (15 mg total) by mouth daily. 90 tablet 3   ??? rifAXIMin (XIFAXAN) 550 mg Tab Take 1 tablet (550 mg total) by mouth Two (2) times a day. 60 tablet 4   ??? spironolactone (ALDACTONE) 50 MG tablet Take 1 tablet (50 mg total) by mouth daily. 30 tablet 11   ??? sulfamethoxazole-trimethoprim (BACTRIM) 400-80 mg per tablet Take 1 tablet (80 mg of trimethoprim total) by mouth Every Monday, Wednesday, and Friday. 12 tablet 0   ??? tacrolimus (PROGRAF) 1 MG capsule Take 2 capsules (2 mg total) by mouth two (2) times a day. (Patient taking differently: Take 1 mg by mouth two (2) times a day. ) 120 capsule 0   ??? valGANciclovir (VALCYTE) 450 mg tablet Take 1 tablet (450 mg total) by mouth daily. 30 tablet 4     No current facility-administered medications for this visit.        Allergies:  Allergies   Allergen Reactions   ??? Bee Pollen Rash     Pt has seasonal allergies that cause excessive sneezing and running nose- [name] Gross, NAII   ??? Pollen Extracts Rash     Pt has seasonal allergies that cause excessive sneezing and running nose- [name] Gross, NAII       Review of Systems:  A comprehensive review of 14 systems was negative except for pertinent positives noted in HPI.       Objective Assessments if available:   Objective     Physical Exam:   Vitals:   There were no vitals filed for this visit.    Growth:   Wt Readings from Last 3 Encounters:   10/04/19 52.9 kg (116 lb 9.6 oz) (2 %, Z= -2.01)*   10/04/19 52.9 kg (116 lb 9.6 oz) (2 %, Z= -2.01)*   10/03/19 52.6 kg (116 lb) (2 %, Z= -2.05)*     * Growth percentiles are based on CDC (Boys, 2-20 Years) data.        There is no height or weight on file to calculate BSA. There is no height or weight on file to calculate BMI.    Growth Percentiles:   No weight on file for this encounter.  No height on file for this encounter.  No height and weight on file for this encounter.     General Appearance: Well appearing/well developed male in no apparent distress   HEENT: Normocephalic/atraumatic, normal hair  distribution, full head of hair, eyes open, normal ear appearance b/l,    MSK: No obvious limitations on ROM. No appreciable joint swelling or deformity. RUE appears thin, bony prominences notable, no swelling or redness noted   Vascular Access: NA   Skin: No bruises or petechiae.    Neurologic: Sleeping intermittently (went to bed late per report) but alert when awake          Diagnostic Evaluation:        CBC w/diff:   Lab Results   Component Value Date    WBC 8.3 10/17/2019    RBC 2.95 (L) 10/03/2019    HGB 9.2 (L) 10/17/2019    HCT 24.5 (L) 10/17/2019    MCV 90.8 10/03/2019    MCH 30.1 10/03/2019    MCHC 33.1 10/03/2019    RDW 25.2 (H) 10/03/2019    MPV 7.0 10/03/2019    PLT 204 10/17/2019    NEUTROPCT 90.1 09/30/2019    LYMPHOPCT 4.2 09/30/2019    MONOPCT 4.4 09/30/2019    EOSPCT 0.1 09/30/2019    BASOPCT 0.3 09/30/2019    NEUTROABS 6.2 10/17/2019    LYMPHSABS 0.7 10/17/2019    MONOSABS 0.8 10/17/2019    BASOSABS 0.0 10/17/2019    EOSABS 0.0 10/17/2019    [id number] 0 02/11/2019    HYPOCHROM Marked (A) 09/30/2019       Iron Panel and Ferritin:  Lab Results   Component Value Date    IRON 188 (H) 09/30/2019    TIBC 232.5 (L) 09/30/2019    TRANSFERRIN 184.5 (L) 09/30/2019    LABIRON 81 (H) 09/30/2019    FERRITIN 22.2 02/05/2018       Coagulation:  Lab Results   Component Value Date    PT 16.4 (H) 10/17/2019    INR 1.30 (H) 10/17/2019    APTT 27.4 09/30/2019    FIBRINOGEN 216 09/22/2016    DDIMER 713 (H) 09/20/2019        Factor Levels:  Lab Results   Component Value Date    FII 117 09/21/2016     No components found for: FVIII    Thrombophilia Testing:  Lab Results Component Value Date    PROTIENC 34 (L) 09/21/2019    PROTEINS 30 (L) 09/21/2019    AT3 66 (L) 09/21/2019    AT3 126 (H) 09/21/2016       Renal Function:  Lab Results   Component Value Date    NA 136 10/17/2019    K 2.8 (L) 10/17/2019    CL 108 10/17/2019    CO2 16.0 (L) 10/17/2019    BUN 22 (H) 10/17/2019    CREATININE 1.15 10/17/2019    GLU 109 (H) 10/17/2019    CALCIUM 8.5 (L) 10/17/2019    MG 1.4 (L) 10/17/2019    PHOS 4.5 10/17/2019     Liver Function:  Lab Results   Component Value Date    AST 339 (H) 10/17/2019    AST 284 (H) 10/10/2019    AST 215 (H) 10/03/2019    ALT 380 (H) 10/17/2019    ALT 348 (H) 10/10/2019    ALT  10/03/2019      Comment:      Specimen icteric.           I spent 21 minutes on the real-time audio and video with the patient. I spent an additional 15 minutes on pre- and post-visit activities.     The patient was physically located in [place] or a  state in which I am permitted to provide care. The patient and/or parent/guardian understood that s/he may incur co-pays and cost sharing, and agreed to the telemedicine visit. The visit was reasonable and appropriate under the circumstances given the patient's presentation at the time.    The patient and/or parent/guardian has been advised of the potential risks and limitations of this mode of treatment (including, but not limited to, the absence of in-person examination) and has agreed to be treated using telemedicine. The patient's/patient's family's questions regarding telemedicine have been answered.     If the visit was completed in an ambulatory setting, the patient and/or parent/guardian has also been advised to contact their provider???s office for worsening conditions, and seek emergency medical treatment and/or call 911 if the patient deems either necessary.         Larene Pickett, FNP  10/20/2019    CC:   [name], MD  Self, Referred

## 2019-10-20 NOTE — Unmapped (Signed)
Dr. Jessee Avers returned this call.

## 2019-10-20 NOTE — Unmapped (Signed)
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    Thank you for letting us be involved with your care!  Please call with any concerns or questions.     Your provider today was Larene Pickett, FNP      Signs and symptoms of VTE   - Pain          - Shortness of breath  - Swelling         - Unexplained cough  - Color changes and warmth for extremity     - Irregular heart rate  - Chest pain    Risk factors for clot formation include:  - Immobility         - Family history of clots particularly in first degree family members  - Surgery         - Inherited or acquired thrombophilias  - Trauma         - Obesity  - Increased estrogen exposure      - Older age  - Certain medical conditions including cancer,     - History of prior clot  heart failure, inflammatory disorders, or nephrotic syndrome  - Cigarette smoking  - Varicose veins     Maintain good hydration.    Travel: Move every 2 hours while traveling. When flying should try to sit in an aisle seat.     Vaccines: Maintain vaccine schedule. Administer subcutaneously.     Pain: AVOID motrin/aspirin/ibuprofen/aleve (NSAIDS) while on anticoagulation. OK to use tylenol as needed.     Dental care:  Ok to have routine dental cleanings.     Sports: Avoid contact sports while on anticoagulation.    Elective procedures/surgeries: Please notify us of any upcoming procedures as they will require holding of anticoagulation and a treatment plan.         Contact Information:   Appointments and Referrals [phone] Pediatric Hematology Oncology Nurse Triage Line for refills, form requests, and non-urgent questions:    Please provide 2 weeks to fill out forms and treatment plans for procedures.  Tel: [phone]  Fax: [phone]   Monday - Friday 8:30am-4:00pm   Please note that it may take up to 48 hours to return your call.         Spanish Interpreter (interprete en espanol), Lorinda Creed [phone]   Ester Rink a [place] de 8:30am-4:00pm            Nights (after 4pm) or weekends: [phone]  Ask for the Pediatric Hematology Oncology   doctor on call      You can also use [email or web address] ([email or web address] to request refills, access test results, and send questions to your doctor!

## 2019-10-21 MED ORDER — POTASSIUM CHLORIDE ER 20 MEQ TABLET,EXTENDED RELEASE(PART/CRYST)
ORAL_TABLET | Freq: Every day | ORAL | 0 refills | 3.00000 days
Start: 2019-10-21 — End: 2019-10-24

## 2019-10-21 MED ORDER — MAGNESIUM OXIDE 400 MG (241.3 MG MAGNESIUM) TABLET
ORAL_TABLET | Freq: Two times a day (BID) | ORAL | 1 refills | 30 days
Start: 2019-10-21 — End: 2020-10-20

## 2019-10-21 NOTE — Unmapped (Signed)
Encounter addended by: Verneda Skill, RN on: 10/20/2019 4:47 PM   Actions taken: Charge Capture section accepted

## 2019-10-24 ENCOUNTER — Encounter: Admit: 2019-10-24 | Discharge: 2019-10-25 | Payer: PRIVATE HEALTH INSURANCE

## 2019-10-24 ENCOUNTER — Ambulatory Visit: Admit: 2019-10-24 | Discharge: 2019-10-25 | Payer: PRIVATE HEALTH INSURANCE

## 2019-10-24 ENCOUNTER — Encounter: Admit: 2019-10-24 | Discharge: 2019-10-25 | Payer: PRIVATE HEALTH INSURANCE | Attending: Clinical | Primary: Clinical

## 2019-10-24 ENCOUNTER — Ambulatory Visit
Admit: 2019-10-24 | Discharge: 2019-10-25 | Payer: PRIVATE HEALTH INSURANCE | Attending: Pediatric Gastroenterology | Primary: Pediatric Gastroenterology

## 2019-10-24 DIAGNOSIS — Z01818 Encounter for other preprocedural examination: Principal | ICD-10-CM

## 2019-10-24 DIAGNOSIS — Z79899 Other long term (current) drug therapy: Principal | ICD-10-CM

## 2019-10-24 DIAGNOSIS — Z944 Liver transplant status: Principal | ICD-10-CM

## 2019-10-24 LAB — CBC W/ AUTO DIFF
BASOPHILS ABSOLUTE COUNT: 0.1 10*9/L (ref 0.0–0.1)
BASOPHILS RELATIVE PERCENT: 0.6 %
HEMOGLOBIN: 10.2 g/dL — ABNORMAL LOW (ref 13.5–17.5)
LARGE UNSTAINED CELLS: 12 % — ABNORMAL HIGH (ref 0–4)
LYMPHOCYTES ABSOLUTE COUNT: 1.8 10*9/L (ref 1.5–5.0)
LYMPHOCYTES RELATIVE PERCENT: 16.5 %
MEAN CORPUSCULAR HEMOGLOBIN CONC: 30.5 g/dL — ABNORMAL LOW (ref 31.0–37.0)
MEAN CORPUSCULAR HEMOGLOBIN: 26.6 pg (ref 26.0–34.0)
MEAN CORPUSCULAR VOLUME: 87.3 fL (ref 80.0–100.0)
MEAN PLATELET VOLUME: 7 fL (ref 7.0–10.0)
MONOCYTES ABSOLUTE COUNT: 0.4 10*9/L (ref 0.2–0.8)
MONOCYTES RELATIVE PERCENT: 4.1 %
NEUTROPHILS ABSOLUTE COUNT: 7.1 10*9/L (ref 2.0–7.5)
NEUTROPHILS RELATIVE PERCENT: 66.7 %
PLATELET COUNT: 167 10*9/L (ref 150–440)
RED BLOOD CELL COUNT: 3.82 10*12/L — ABNORMAL LOW (ref 4.50–5.90)
RED CELL DISTRIBUTION WIDTH: 25.6 % — ABNORMAL HIGH (ref 12.0–15.0)
WBC ADJUSTED: 10.7 10*9/L (ref 4.5–11.0)

## 2019-10-24 LAB — PHOSPHORUS: Phosphate:MCnc:Pt:Ser/Plas:Qn:: 3.6

## 2019-10-24 LAB — COMPREHENSIVE METABOLIC PANEL
ALBUMIN: 2.7 g/dL — ABNORMAL LOW (ref 3.5–5.0)
ALKALINE PHOSPHATASE: 2255 U/L — ABNORMAL HIGH (ref 65–260)
AST (SGOT): 489 U/L — ABNORMAL HIGH (ref 19–55)
BILIRUBIN TOTAL: 33.1 mg/dL — ABNORMAL HIGH (ref 0.0–1.2)
BLOOD UREA NITROGEN: 27 mg/dL — ABNORMAL HIGH (ref 7–21)
BUN / CREAT RATIO: 25
CALCIUM: 8.8 mg/dL (ref 8.5–10.2)
CHLORIDE: 112 mmol/L — ABNORMAL HIGH (ref 98–107)
CREATININE: 1.07 mg/dL (ref 0.70–1.30)
EGFR CKD-EPI AA MALE: >90 mL/min/{1.73_m2} (ref >=60)
EGFR CKD-EPI NON-AA MALE: >90 mL/min/{1.73_m2} (ref >=60)
GLUCOSE RANDOM: 114 mg/dL (ref 70–179)
POTASSIUM: 3.7 mmol/L (ref 3.5–5.0)
SODIUM: 139 mmol/L (ref 135–145)

## 2019-10-24 LAB — PREALBUMIN: Prealbumin:MCnc:Pt:Ser/Plas:Qn:: 8.7 — ABNORMAL LOW

## 2019-10-24 LAB — PROTIME: Coagulation tissue factor induced:Time:Pt:PPP:Qn:Coag: 16 — ABNORMAL HIGH

## 2019-10-24 LAB — GAMMA GLUTAMYL TRANSFERASE: Gamma glutamyl transferase:CCnc:Pt:Ser/Plas:Qn:: 500 — ABNORMAL HIGH

## 2019-10-24 LAB — SLIDE REVIEW

## 2019-10-24 LAB — PROTEIN TOTAL: Protein:MCnc:Pt:Ser/Plas:Qn:: 0

## 2019-10-24 LAB — TACROLIMUS BLOOD: Lab: 4

## 2019-10-24 LAB — RED CELL DISTRIBUTION WIDTH: Lab: 25.6 — ABNORMAL HIGH

## 2019-10-24 LAB — HEMOGLOBIN A1C: HEMOGLOBIN A1C: 4.6 % — ABNORMAL LOW (ref 4.8–5.6)

## 2019-10-24 LAB — ESTIMATED AVERAGE GLUCOSE: Estimated average glucose:MCnc:Pt:Bld:Qn:Estimated from glycated hemoglobin: 85

## 2019-10-24 LAB — MAGNESIUM: Magnesium:MCnc:Pt:Ser/Plas:Qn:: 1.3 — ABNORMAL LOW

## 2019-10-24 LAB — POIKILOCYTES

## 2019-10-24 NOTE — Unmapped (Signed)
Confidential Psychological Therapy Session  [facility]    Patient Name: [name] Gross  Medical Record Number: [id number]  Date of Service: October 24, 2019  Clinical Psychologist: Artemio Aly, PhD  Intern: None  Time Spent: 45 min of face-to-face counseling  CPT Procedure Code: 81191 (45 min psychotherapy with patient and/or family)  Therapy Type: Behavior Modifying/Cognitive Behavioral Therapy (CBT)  Purpose of Treatment: adjustment to chronic medical illness,??improve coping,??reduce depression symptoms.    Referral/Relevant History:  Mr.??Gross??is a very pleasant 18 y.o.??male??who presents for cognitive behavioral therapy to address adjustment to chronic medical illness (and recent hospitalization for liver transplant rejection). He was seen by the adult inpatient psychiatry team on 08/23/19 for a history of MDD (and two previous suicide attempts, last attempt April 2019) and current depressive symptoms. During assessment??with psychiatry, Mr. Kahrs declined pharmacotherapy but stated that he would be interested in receiving therapy services while inpatient to work through his difficult medical diagnoses and family situation.??He was seen by this provider for an initial visit on 08/29/19 and noted improved mood after reconnecting with social support, and requested continued psychology follow-up while inpatient. He was seen again by inpatient psychiatry on 09/01/19 and continued to decline pharmacotherapy, but later expressed interest in this and was prescribed venlafaxine prior to discharge from the hospital on 09/10/19 but was discontinued during a hospitalization on 09/18/19 due to potential AMS and contribution to a hand tremor.  ??  Mr. Kyne was seen by this provider on 09/13/19 following discharge from the hospital for therapy, but then re-presented to the hospital on 09/15/19 for elevated creatinine prior to leaving AMA. He was then re-admitted to the hospital on 09/18/19 for a fever and was seen again during his inpatient stay by this provider on 09/20/19. Today is his 3rd outpatient session (and 6th overall) with Clinical research associate.??His mother [name] was present at the beginning of today's session.    Review of Symptoms/ROS: Deferred    Subjective:   Mr. Hoecker reported that he has been tired over the past 3 weeks or so, due to only getting about 3-4 hours of sleep per night. He noted that this is due to having at least 5-7 bowel movements per day, including being woken up frequently at night by needing to use the restroom; he is unsure what is causing this. He also endorsed some ongoing low back pain (unclear cause), as well as additional fatigue due to living at his mother's house for the past week, which has a lot more stairs.      With regards to mood, Mr. Debow reported that he is feeling fine, I guess but did endorse often feeling annoyed with everything. Specifically, he reports feeling annoyed that he is doing so much work but not seeing results (e.g. labs not improving significantly, not currently being eligible for transplant). He denied any depressed mood, anhedonia, or anxiety lately, and denied any SI/HI. He noted that he has not been able to engage in as many enjoyable activities lately due to fatigue and back pain, but hopes to resume these soon (e.g. cooking for himself, playing video games).     During discussion of adherence issues, Mr. Demarest did note that he has not done any finger sticks or taken insulin for approximately four days, as he has been experiencing significant finger pain. This was discussed, and issues related to adherence were brought up (e.g. leaving AMA on 09/15/19). Mr. Pounders described ways that he has been trying to stay adherent, like taking  notes during medical appts on his phone. He and his mother expressed intention to adhere to all medical directives, though he noted some uncertainty about being able to do things which cause physical pain/discomfort; for example, he is not sure if he will be able to attend appts if it is snowing or very cold outside because he is still sensitive to cold temperatures. He was encouraged to maintain adherence despite potential for some physical discomfort.     Finally, Mr. Zatarain indicated that he has started to wonder if another transplant is the best option for him, giving potential for increased pain and a long recovery. He clearly stated that he still does want a transplant, and is aware that the alternative would likely be hospice care until death.     Objective:     Mental Status Exam:   Appearance:??Malnourished and??jaundiced, and very tired (eyes closed for much of appt).  Motor:??Very small hand tremor  Speech/Language:??Quiet speech, but normal rate and rhythm??  Mood:??Fine  Affect:??Sad and somewhat flat, but he noted that he has always been quiet and private at baseline. More expressive than in previous sessions and laughed several times.  Thought Process:??Logical, linear, clear, coherent, goal directed  Thought Content:??Denies SI, HI, self harm, delusions, obsessions, paranoid ideation, or ideas of reference  Perceptual Disturbances:??Denies auditory and visual hallucinations, behavior not concerning for response to internal stimuli  Orientation:??Oriented to person, place, time, and general circumstances  Attention:??Able to fully attend without fluctuations in consciousness  Concentration:??Distractible, likely due to significant fatigue  Memory:??Immediate, short-term, long-term, and recall grossly intact  Fund of Knowledge:??Consistent with level of education and development  Insight:??Intact  Judgment:??Intact  Impulse Control:??Intact    Assessment:  Mr.  Gingerich participated well in this CBT session and exhibits good motivation towards treatment goals.   ??  Mr.??Gross??described his mood as fine, though he acknowledged feeling annoyed by everything recently as he feels he has been doing a lot of work but not seeing as many results as he would like. He denied current concerns with anxiety or depression. He continues to look mildly depressed, as he is quiet and somewhat difficult to engage; however, he notes that this may be his baseline and that he is a quiet and private person in general, and also appears to be dealing with significant fatigue today due to sleep loss. He appears to meet criteria for recurrent MDD, and seems to be in early remission at this time given recent improvements in the past several??weeks.??It is likely that continuing to promote ways to help [name] remain engaged and socially connected will be helpful. Continuing to promote??behavioral activation and goal setting??will also likely??be particularly useful. Additionally, exploring his values in life and finding ways to help him maintain some independence will likely be very beneficial. Improvements??in depression and anxiety symptoms can contribute to improved quality of life, functionality, and ability to cope with chronic illness.   ??  Focus on current treatment is??reduction of depressive symptoms, increasing behavioral activation, learning cognitive restructuring strategies, adjustment to chronic medical conditions.   ??  Focus on future sessions will include??values exploration, behavioral activation, learning cognitive coping strategies, relaxation strategies.  ??  Adherence concerns:??[name] Gross left AMA after presenting to the hospital on 09/15/19, despite speaking with his TNC Jamey Reas. He reported that he understands why leaving AMA is concerning to the transplant team, but felt overwhelmed after being in excruciating pain and felt like he needed to leave. He also reported today that he has not  been checking his blood sugar or taking insulin for about four days due to finger pain.     Mr. Thorstenson appears to be struggling with the balance between wanting independence and needing medical assistance, but expressed a willingness to adhere to medical recommendations. He also appears to be focusing on short-term outcomes, like not wanting to attend appointments if it is cold outside, despite the potential long-term impact this may have on the team's perception of his adherence. He appears to be doing well with attendance to outpatient appts and taking medication recently.??  ??  Diagnostic Impression:??Major Depressive Disorder, Recurrent Episode, Moderate Severity, in early remission    Risk Assessment:  A suicide and violence risk assessment was performed as part of this evaluation. The patient is deemed to be at chronic elevated risk for self-harm/suicide given the following factors: male age 23-35, current diagnosis of depression, previous acts of self-harm and chronic severe medical condition. The patient is deemed to be at chronic elevated risk for violence given the following factors: male gender and younger age. These risk factors are mitigated by the following factors:lack of active SI/HI, no know access to weapons or firearms, motivation for treatment, supportive family, enjoyment of leisure actvities, expresses purpose for living, current treatment compliance and support system in agreement with treatment recommendations. There is no acute risk for suicide or violence at this time. The patient was educated about relevant modifiable risk factors including following recommendations for treatment of psychiatric illness and abstaining from substance abuse.    While future psychiatric events cannot be accurately predicted, the patient does not currently require  acute inpatient psychiatric care and does not currently meet [facility] involuntary commitment criteria.      Psychometric Testing: None administered today due to significant patient fatigue. Previous scores on the PHQ-9 and GAD-7 are shown below:    PHQ-9 (depression symptoms):    09/13/19: 3 (minimal)  10/03/19: 0 (none) GAD-7 (anxiety symptoms):    09/13/19: 2 (minimal)  10/03/19: 4 (minimal)    Plan:  Mr.  Shafer will continue meeting with me for CBT, and was given instructions for therapeutic homework, including working on improving adherence to medical directives.     Mr.  Schubert will return in approximately one month. If possible, his appt will be scheduled to coincide with other medical appts, or a phone/video visit can be scheduled.    ??  Mr.  Lucarelli was given this writer's business card with confidential voice mail number and instructed to call 911 for emergencies.

## 2019-10-24 NOTE — Unmapped (Signed)
Transplant Surgery History and Physical      Assessment/Recommendations:    [name] Gross is a 18 y.o. male seen in consultation at the request of [name] Gross* for evaluation of candidacy for transplantation.    I spent 45 minutes with the patient obtaining the above history and physical examination, and greater than 50% of the time was spent counseling and on the substance of the discussion.    Today we discussed liver transplantation going over the surgery to be performed, the hospital course including length of stay, anti-rejection medications and their side effects, results and the cadaveric donor system.    I discussed in detail with [name] Gross the risks and benefits of liver transplantation, including but not limited to: the general anesthetic, monitoring lines, the incision, the hepatectomy, as well as reimplantation of the liver graft and immunosuppressant medications. In regards to the surgical procedure, we noted that it is a major operation performed under general anesthesia with the risks of heart attack, stroke and death. Multiple invasive means of monitoring may be necessary during the operation including an arterial line, a central venous catheter, a foley catheter inserted into the bladder, and a tube from your nose into your stomach to prevent stomach distension. After surgery, the patient will go to the Intensive Care Unit and is then sent to the regular floor when medically stable. I discussed the possible complications including the need for reoperation for bleeding, infection or other complications, the possibility of clotting/leakage of blood vessels, requiring either radiological intervention, surgical intervention, or even retransplantation. I reviewed the possibility of complications involving the biliary tract including leaks, strictures and need for retransplantation for biliary complications. I discussed the possibility of primary nonfunction of the liver graft requiring urgent retransplantation or the result of death. The patient understands the need for long-term immunosuppression therapy as well as monitoring of labs and immunosuppression. Anti-rejection medications, including Prograf or Cyclosporine (Neoral), Cellcept, steroids and others, will be needed after transplantation and for the patient???s entire lifetime. Problems include infection, cancer, hirsutism, tremors, gum swelling, hypertension, bone fractures, aggravation of diabetes or new onset diabetes, cataracts, and rashes. Finally, the donor system was reviewed. All donors are tested for infections and other diseases, but there is a small chance of transmission of diseases including viruses as well as the possible transmission of tumors. Some patients may elect to receive a liver from a donor who was exposed to the Hepatitis B or Hepatitis C virus and the recipient may require certain anti-viral medications to prevent this virus from damaging the new liver.    Finally, I reviewed with the patient how the surgery is expected to improve their health and quality of life, that the average length of hospitalization stay is 10-12 days, and that the length of their expected recovery period, including when normal daily activities may be resumed, will be patient dependent.    [name] Gross had all their questions answered and wishes to proceed with the liver transplant evaluation process.    - GI Pathogen panel, C.Difficile sent for 3 weeks of diarrhea  - Needs nutritional optimization, needs to be physically active and daily exercises to build some strength  - Prealbumin  - Continue CBT as per psych recs  - Plan is to re-evaluate him in 7-8 weeks in clinic, have a better evaluation of his nutritional status, compliance and social support before getting him listed for re-transplant.  ??  This patient was seen and evaluated with Dr. Celine Gross  and decision was made to have him revisit transplant surgery clinic in 8 weeks for re-evaluation.      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 He was recently??admitted to the PICU on??09/18/2019??for concern for spontaneous bacterial peritonitis with fever, fatigue, and altered mental status. Patient was transfered to acute care floor on 11/9.??Patient found??to have a right upper extremity DVT on PVL on 11/9. Unclear if clot is old (could be 2/2 to PICC line), and with??[name] Gross being??asymptomatic and in consultation with Heme/onc??no??anticoagulation??given in setting of G1 esophageal varices. He was discharged on 11/14 and says that has been doing well since then. He denies any fever, chills, hematemesis, melena. He does have ascites and limb swelling. He says that his appetite is getting better. His ascites is well controlled and has not required paracentesis since his last admission. He does complain of diarrhea for the last 2-3 weeks. He is not on daily lactulose.        Most Recent Value  09/11/2019 - 10/25/2019   MELD-Na 24  10/24/2019       Allergies     Bee pollen and Pollen extracts      Medications      Current Outpatient Medications   Medication Sig Dispense Refill   ??? blood sugar diagnostic Strp Use to check blood sugar four times daily as directed. 100 strip 12   ??? blood-glucose meter kit Use as instructed 1 each 0   ??? calcium carbonate (OS-CAL) 1,500 mg (600 mg elem calcium) tablet Take 1/2 tablet (300 mg elem calcium) by mouth Two (2) times a day. (Over the counter med) 60 tablet 0   ??? cholecalciferol, vitamin D3, 125 mcg (5,000 unit) tablet Take 1 tablet (5,000 Units total) by mouth daily. 30 tablet 11   ??? clindamycin-benzoyl peroxide 1.2 %(1 % base) -5 % gel Apply to rash on chest (Patient taking differently: Apply 1 application topically nightly. Apply to rash on chest) 45 g 0   ??? glucagon 0.5 mg/0.1 mL Syrg Inject 0.1 mL under the skin once as needed for up to 1 dose. 0.2 mL 2   ??? glucose 4 GM chewable tablet Chew 1 tablet (4 g total) 4 (four) times a day as needed for low blood sugar (refer to hypoglycemia guideline). 50 tablet 12   ??? insulin glargine (LANTUS) 100 unit/mL injection Inject 0.05 mL (5 Units total) under the skin nightly. 10 mL 0   ??? insulin lispro (HUMALOG) 100 unit/mL injection Inject 0.02-0.04 mL (2-4 Units total) under the skin Four (4) times a day (before meals and nightly). 10 mL 0   ??? insulin syr/ndl U100 half mark (BD INSULIN SYRINGE HALF UNIT) 0.3 mL 31 gauge x 5/16 Syrg Use to inject insulins up to 6 times daily as directed. 200 each 12   ??? ketoconazole (NIZORAL) 2 % shampoo Apply topically daily as directed. 120 mL 0   ??? lactulose (CHRONULAC) 10 gram/15 mL solution Take 30 mL (20 g total) by mouth daily as needed (for constipation). 240 mL 0   ??? lancets Misc Use to check blood glucose 6 times per day. 204 each 0   ??? magnesium oxide (MAGOX) 400 mg (241.3 mg magnesium) tablet Take 1 tablet (400 mg total) by mouth Two (2) times a day. 60 tablet 1   ??? melatonin  3 mg Tab Take 1 tablet (3 mg total) by mouth every evening. (Patient taking differently: Take 3 mg by mouth nightly as needed. ) 60 tablet 0   ??? multivitamin, with zinc (AQUADEKS) 100-5 mcg-mg Chew Chew 2 tablets daily. 60 tablet 0   ??? mycophenolate (MYFORTIC) 180 MG EC tablet Take 3 tablets (540 mg total) by mouth Two (2) times a day. 180 tablet 11   ??? ondansetron (ZOFRAN-ODT) 4 MG disintegrating tablet Take 1 tablet (4 mg total) by mouth every eight (8) hours as needed for up to 7 days. 10 tablet 0   ??? pantoprazole (PROTONIX) 40 MG tablet Take 1 tablet (40 mg total) by mouth daily. 30 tablet 0   ??? potassium & sodium phosphates 250mg  (PHOS-NAK/NEUTRA PHOS) 280-160-250 mg PwPk Take 2 packets by mouth Three (3) times a day. 180 packet 0   ??? potassium chloride (KLOR-CON) 20 MEQ CR tablet Take 2 tablets (40 mEq total) by mouth daily for 3 days. 6 tablet 0   ??? predniSONE (DELTASONE) 5 MG tablet Take 3 tablets (15 mg total) by mouth daily. 90 tablet 3   ??? rifAXIMin (XIFAXAN) 550 mg Tab Take 1 tablet (550 mg total) by mouth Two (2) times a day. 60 tablet 4   ??? spironolactone (ALDACTONE) 50 MG tablet Take 1 tablet (50 mg total) by mouth daily. 30 tablet 11   ??? sulfamethoxazole-trimethoprim (BACTRIM) 400-80 mg per tablet Take 1 tablet (80 mg of trimethoprim total) by mouth Every Monday, Wednesday, and Friday. 12 tablet 0   ??? tacrolimus (PROGRAF) 1 MG capsule Take 2 capsules (2 mg total) by mouth two (2) times a day. (Patient taking differently: Take 1 mg by mouth two (2) times a day. ) 120 capsule 0   ??? valGANciclovir (VALCYTE) 450 mg tablet Take 1 tablet (450 mg total) by mouth daily. 30 tablet 4     No current facility-administered medications for this visit.     (Not in a hospital admission)        Past Medical History    Past Medical History:   Diagnosis Date   ??? 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 Depression    ??? MSSA bacteremia 08/27/2019   ??? Seasonal allergies    ??? Sickle cell trait (CMS-HCC)    ??? Strep Mitis Central line-associated bloodstream infection 08/27/2019         Past Surgical History    Past Surgical History:   Procedure Laterality Date   ??? FRENULECTOMY, LINGUAL     ??? PR ERCP REMOVE FOREIGN BODY/STENT BILIARY/PANC DUCT N/A 08/20/2017    Procedure: ENDOSCOPIC RETROGRADE CHOLANGIOPANCREATOGRAPHY (ERCP); W/ REMOVAL OF FOREIGN BODY/STENT FROM BILIARY/PANCREATIC DUCT(S);  Surgeon: Vonda Antigua, MD;  Location: GI [facility]URES [facility];  Service: Gastroenterology   ??? PR ERCP REMOVE FOREIGN BODY/STENT BILIARY/PANC DUCT N/A 06/04/2018    Procedure: ENDOSCOPIC RETROGRADE CHOLANGIOPANCREATOGRAPHY (ERCP); W/ REMOVAL OF FOREIGN BODY/STENT FROM BILIARY/PANCREATIC DUCT(S);  Surgeon: Chriss Driver, MD;  Location: GI PROCEDURES [facility];  Service: Gastroenterology   ??? PR ERCP STENT PLACEMENT BILIARY/PANCREATIC DUCT N/A 11/21/2016    Procedure: ENDOSCOPIC RETROGRADE CHOLANGIOPANCREATOGRAPHY (ERCP); WITH PLACEMENT OF ENDOSCOPIC STENT INTO BILIARY OR PANCREATIC DUCT;  Surgeon: Vonda Antigua, MD;  Location: GI PROCEDURES [facility]IAL [facility];  Service: Gastroenterology   ??? PR ERCP,W/REMOVAL STONE,BIL/PANCR DUCTS N/A 08/20/2017  Procedure: ERCP; W/ENDOSCOPIC RETROGRADE REMOVAL OF CALCULUS/CALCULI FROM BILIARY &/OR PANCREATIC DUCTS;  Surgeon: Vonda Antigua, MD;  Location: GI PROCEDURES [facility];  Service: Gastroenterology   ??? PR ERCP,W/REMOVAL STONE,BIL/PANCR DUCTS N/A 06/04/2018    Procedure: ERCP; W/ENDOSCOPIC RETROGRADE REMOVAL OF CALCULUS/CALCULI FROM BILIARY &/OR PANCREATIC DUCTS;  Surgeon: Chriss Driver, MD;  Location: GI PROC[facility]URES [facility];  Service: Gastroenterology   ??? PR TRANSPLANT LIVER,ALLOTRANSPLANT N/A 09/22/2016    Procedure: LIVER ALLOTRANSPLANTATION; ORTHOTOPIC, PARTIAL OR WHOLE, FROM CADAVER OR LIVING DONOR, ANY AGE;  Surgeon: Doyce Loose, MD;  Location: MAIN OR [facility];  Service: Transplant   ??? PR TRANSPLANT,PREP DONOR LIVER, WHOLE N/A 09/22/2016    Procedure: [facility] STD PREP CAD DONOR WHOLE LIVER GFT PRIOR TNSPLNT,INC CHOLE,DISS/REM SURR TISSU WO TRISEG/LOBE SPLT;  Surgeon: Doyce Loose, MD;  Location: MAIN OR [facility];  Service: Transplant   ??? PR UPGI ENDOSCOPY W/US FN BX N/A 08/20/2017    Procedure: UGI W/ TRANSENDOSCOPIC ULTRASOUND GUIDED INTRAMURAL/TRANSMURAL FINE NEEDLE ASPIRATION/BIOPSY(S), ESOPHAGUS;  Surgeon: Vonda Antigua, MD;  Location: GI PROCEDURES [facility];  Service: Gastroenterology   ??? PR UPPER GI ENDOSCOPY,BIOPSY N/A 07/15/2016    Procedure: UGI ENDOSCOPY; WITH BIOPSY, SINGLE OR MULTIPLE;  Surgeon: Arnold Long Mir, MD;  Location: PEDS PROCEDURE ROOM [facility];  Service: Gastroenterology   ??? PR UPPER GI ENDOSCOPY,CTRL BLEED Left 05/05/2016    Procedure: UGI ENDOSCOPY; WITH CONTROL OF BLEEDING, ANY METHOD;  Surgeon: Arnold Long Mir, MD;  Location: PEDS PROCEDURE ROOM [facility];  Service: Gastroenterology   ??? PR UPPER GI ENDOSCOPY,CTRL BLEED N/A 05/12/2016    Procedure: UGI ENDOSCOPY; WITH CONTROL OF BLEEDING, ANY METHOD;  Surgeon: Annie Paras, MD;  Location: CHILDRENS OR [facility];  Service: Gastroenterology   ??? PR UPPER GI ENDOSCOPY,DIAGNOSIS N/A 09/21/2019    Procedure: UGI ENDO, INCLUDE ESOPHAGUS, STOMACH, & DUODENUM &/OR JEJUNUM; DX W/WO COLLECTION SPECIMN, BY BRUSH OR WASH;  Surgeon: Monte Fantasia, MD;  Location: GI PROCEDURES [facility];  Service: Gastroenterology         Family History    The patient's family history includes Asthma in his brother; Diabetes in his maternal grandmother and paternal grandmother; Hypertension in his maternal grandfather and paternal grandfather..      Social History:    Tobacco use: denies  Alcohol use: denies  Drug use: denies      Review of Systems    A 12 system review of systems was negative except as noted in HPI    Objective     PE: Blood pressure 106/59, pulse 99, temperature 37.3 ??C (99.1 ??F), temperature source Temporal, height 170.2 cm (5' 7), weight 54 kg (119 lb). Body mass index is 18.64 kg/m??.  General: alert and oriented  Lungs: clear to auscultation, percussion to the bases, and unlabored breathing  Heart: euvolemic, regular rate and rhythm, normal S1 and S2, no murmur  Abd: soft, distended, mild tenderness, no rebound or guarding, free fluid+  Ascites: moderate   Skin: jaundice  Ext: edema  Neuro: non-focal exam. thought organized, appropriate affect, normal fluent speech        Test Results    Labs:  All lab results last 24 hours:    Recent Results (from the past 24 hour(s))   PT-INR    Collection Time: 10/24/19 12:09 PM   Result Value Ref Range    PT 16.0 (H) 10.5 - 13.5 sec    INR 1.36    Phosphorus Level    Collection Time: 10/24/19 12:10  PM   Result Value Ref Range    Phosphorus 3.6 2.9 - 4.7 mg/dL   Magnesium Level    Collection Time: 10/24/19 12:10 PM   Result Value Ref Range    Magnesium 1.3 (L) 1.6 - 2.2 mg/dL   Gamma GT (GGT)    Collection Time: 10/24/19 12:10 PM   Result Value Ref Range    GGT 500 (H) 12 - 109 U/L   Comprehensive Metabolic Panel    Collection Time: 10/24/19 12:10 PM   Result Value Ref Range    Sodium 139 135 - 145 mmol/L    Potassium 3.7 3.5 - 5.0 mmol/L    Chloride 112 (H) 98 - 107 mmol/L    Anion Gap      CO2      BUN 27 (H) 7 - 21 mg/dL    Creatinine 1.61 0.96 - 1.30 mg/dL    BUN/Creatinine Ratio 25     EGFR CKD-EPI Non-African American, Male >90 >=60 mL/min/1.10m2    EGFR CKD-EPI African American, Male >90 >=60 mL/min/1.39m2    Glucose 114 70 - 179 mg/dL    Calcium 8.8 8.5 - 04.5 mg/dL    Albumin 2.7 (L) 3.5 - 5.0 g/dL    Total Protein      Total Bilirubin 33.1 (H) 0.0 - 1.2 mg/dL    AST 409 (H) 19 - 55 U/L    ALT      Alkaline Phosphatase 2,255 (H) 65 - 260 U/L   CBC w/ Differential    Collection Time: 10/24/19 12:10 PM   Result Value Ref Range    WBC 10.7 4.5 - 11.0 10*9/L    RBC 3.82 (L) 4.50 - 5.90 10*12/L    HGB 10.2 (L) 13.5 - 17.5 g/dL    HCT 81.1 (L) 91.4 - 53.0 %    MCV 87.3 80.0 - 100.0 fL    MCH 26.6 26.0 - 34.0 pg    MCHC 30.5 (L) 31.0 - 37.0 g/dL    RDW 78.2 (H) 95.6 - 15.0 %    MPV 7.0 7.0 - 10.0 fL    Platelet 167 150 - 440 10*9/L    Variable HGB Concentration Moderate (A) Not Present    Neutrophils % 66.7 %    Lymphocytes % 16.5 %    Monocytes % 4.1 %    Eosinophils % 0.2 %    Basophils % 0.6 %    Absolute Neutrophils 7.1 2.0 - 7.5 10*9/L    Absolute Lymphocytes 1.8 1.5 - 5.0 10*9/L    Absolute Monocytes 0.4 0.2 - 0.8 10*9/L    Absolute Eosinophils 0.0 0.0 - 0.4 10*9/L    Absolute Basophils 0.1 0.0 - 0.1 10*9/L    Large Unstained Cells 12 (H) 0 - 4 %    Microcytosis Marked (A) Not Present    Macrocytosis Slight (A) Not Present    Anisocytosis Marked (A) Not Present    Hyperchromasia Slight (A) Not Present    Hypochromasia Marked (A) Not Present   Morphology Review    Collection Time: 10/24/19 12:10 PM   Result Value Ref Range    Smear Review Comments See Comment (A) Undefined    Target Cells Moderate (A) Not Present    Schistocytes Occasional (AA) Not Present    Burr Cells Present (A) Not Present    Poikilocytosis Moderate (A) Not Present       Imaging:

## 2019-10-24 NOTE — Unmapped (Signed)
Patient placed on contact isolation precaution.  Isolation gown and gloves used with direct patient contact.    Patient was shaky and I ask him twice if he wants me to check his blood sugar.  Patient refused.  He just wanted some juice.  Gave patient juice and some extra juices for the travel back home which is an hour away.

## 2019-10-25 ENCOUNTER — Encounter: Admit: 2019-10-25 | Discharge: 2019-10-26 | Payer: PRIVATE HEALTH INSURANCE

## 2019-10-25 NOTE — Unmapped (Signed)
MD medication orders signed by Dr. Jessee Avers and faxed to [facility] at [phone] by our office staff.

## 2019-10-25 NOTE — Unmapped (Signed)
Assessment/Plan:        Diagnoses and all orders for this visit:  I asked him to double the Mag oxide, trial of Immodium up to 3 daily - they should call me after 2 days to see if it helps - if it fails, consider lowering the mycophenylate - he said the dietician was going to call - if this does not happen, I will set this up - likely would benefit from tube feeds esp over night    Diarrhea, unspecified type  -     GI Pathogen Panel ([place])  -     C. Difficile Assay; Future  -     Hemoglobin A1c          Subjective:     Patient ID: [name] Gross is a 18 y.o. male.    HPI - this patient had an in person visit for f/u liver failure -- hx provided by mother and patient  - overall not doing well - very frail, jaundice, still tremor but less -   - says he feels weak and tired - walks around at home, but using wheelchair at hospital - they have stairs at home, but he tried to avoid the stairs  - stopped insulin as prednisone dose lowered - HbA1C today was normal !! - now taking 15 mg prednisone daily for past week  - still ascites but feet no longer swollen  - had 1 dose lasix/albumin weeks ago   - he complains most with diarrhea - 7X daily - may waken him - no blood - no longer on lactulose - drinks a lot of water - some abdo pain - today C. Diff was negative, GI pathogen panel pending  - sleeps poorly  - some recent back pain   - taking his meds (except insulin that he may no longer need anyways) - Tacro 1 mg bid, mycophenylate bid, ADEK, protonix, Bactrim m/w/f, Mag oxide one daily, Valcyte, Vit D, Xifaxan, spironolactone (today potassium was normal)  - very jaundice but not itchy        The following portions of the patient's history were reviewed and updated as appropriate: allergies, current medications, past family history, past medical history, past social history, past surgical history and problem list.      Review of Systems Constitutional: Positive for appetite change, fatigue and unexpected weight change.   Gastrointestinal: Positive for abdominal distention, abdominal pain and diarrhea.   Neurological: Positive for tremors.           Objective:    Physical Exam  Vitals signs reviewed.   Constitutional:       General: He is not in acute distress.     Appearance: He is ill-appearing. He is not toxic-appearing.   HENT:      Head: Normocephalic.      Nose: Nose normal.   Eyes:      Pupils: Pupils are equal, round, and reactive to light.   Neck:      Musculoskeletal: Normal range of motion.   Cardiovascular:      Rate and Rhythm: Normal rate.      Pulses: Normal pulses.   Pulmonary:      Effort: Pulmonary effort is normal.   Abdominal:      General: There is distension.      Palpations: Abdomen is soft. There is no mass.      Tenderness: There is no abdominal tenderness. There is no right CVA tenderness, left CVA tenderness, guarding or  rebound.      Hernia: No hernia is present.   Musculoskeletal: Normal range of motion.   Skin:     Capillary Refill: Capillary refill takes less than 2 seconds.   Neurological:      General: No focal deficit present.      Mental Status: He is alert.

## 2019-10-25 NOTE — Unmapped (Signed)
I spent 10 minutes on the phone with the patient.    The patient was physically located in [place] or a state in which I am permitted to provide care. The patient and/or parent/guardian understood that s/he may incur co-pays and cost sharing, and agreed to the telemedicine visit. The visit was reasonable and appropriate under the circumstances given the patient's presentation at the time.    The patient and/or parent/guardian has been advised of the potential risks and limitations of this mode of treatment (including, but not limited to, the absence of in-person examination) and has agreed to be treated using telemedicine. The patient's/patient's family's questions regarding telemedicine have been answered.     If the visit was completed in an ambulatory setting, the patient and/or parent/guardian has also been advised to contact their provider???s office for worsening conditions, and seek emergency medical treatment and/or call 911 if the patient deems either necessary.    **THIS PATIENT WAS NOT SEEN IN PERSON TO MINIMIZE POTENTIAL SPREAD OF COVID-19, PROTECT PATIENTS/PROVIDERS, AND REDUCE PPE UTILIZATION.**        Call to patient for scheduled follow up. He reports that he just woke up and his mother/caregiver is downstairs. He states a HH company is coming by at 73 and requested CSW call back at 1500 today. CSW will attempt to accommodate.     Carole Binning, LCSW-A  Transplant Case Manager  10/25/2019. 10:54 AM

## 2019-10-27 NOTE — Unmapped (Signed)
Patient denied medication refills for tacrolimus. Patient stated that they have 2 weeks worth of medication on hand. Moving specialty refill call to appropriate date.

## 2019-11-01 NOTE — Unmapped (Signed)
The nurse from [facility] Mickeal Skinner [phone]) left a message on my voicemail. They went out and saw [name] Gross today and he was complaining of itching. He told her it was because he had orange juice today. She is wondering if he could take something for it like Benedryl ?

## 2019-11-02 ENCOUNTER — Encounter: Admit: 2019-11-02 | Discharge: 2019-11-03 | Payer: PRIVATE HEALTH INSURANCE

## 2019-11-02 NOTE — Unmapped (Signed)
Call to patient for follow up on caregiving and well being.   Co-primary caregiver is now his mother who is not present today.   Patient informs CSW that he is staying at both his grandmother and mother's home. He has been staying at his mother's for the past two weeks and went back to his grandmother's today.   When asked what has changed regarding his dynamic with his mother and their relationship, '...he said nothing; my family went behind my back and violated HIPPA... they told my mom everything without my permission... it was forced upon me... I wasn't ready to talk to her about stuff...'   He states that he cannot change her not being a part of her caregiving plan now.     Explained to him that CSW needs to have an appointment/discussion with his mother present to review, validate caregivng plan as it has changed.     He expressed understanding and will call CSW to schedule and appointment within the next week after he speaks to his mother and is able to coordinate a day/time.     Updated TNC.    Carole Binning, LCSW-A  Transplant Case Manager  11/02/2019. 12:14 PM

## 2019-11-03 NOTE — Unmapped (Signed)
Spoke to [employer]. OK to take Benadryl 25 mg PRN - use sparingly. He is otherwise doing well, talkative and happy today per [facility].

## 2019-11-08 ENCOUNTER — Other Ambulatory Visit (HOSPITAL_COMMUNITY)
Admission: RE | Admit: 2019-11-08 | Discharge: 2019-11-08 | Disposition: A | Discharge status: Home / Self Care | Payer: Medicaid Other | Source: Ambulatory Visit | Attending: Pediatric Gastroenterology | Admitting: Pediatric Gastroenterology

## 2019-11-08 DIAGNOSIS — T8641 Liver transplant rejection: Principal | ICD-10-CM

## 2019-11-08 DIAGNOSIS — Z4823 Encounter for aftercare following liver transplant: Secondary | ICD-10-CM | POA: Diagnosis not present

## 2019-11-08 DIAGNOSIS — Z79899 Other long term (current) drug therapy: Secondary | ICD-10-CM | POA: Diagnosis not present

## 2019-11-08 LAB — PROTIME-INR
INR: 1.2 (ref 0.8–1.2)
INR: 1.2
Prothrombin Time: 15.2 seconds (ref 11.4–15.2)

## 2019-11-08 LAB — COMPREHENSIVE METABOLIC PANEL
ALKALINE PHOSPHATASE: 2462 U/L — ABNORMAL HIGH
ALT (SGPT): 570 U/L — ABNORMAL HIGH
ALT: 570 U/L — ABNORMAL HIGH (ref 0–44)
AST: 439 U/L — ABNORMAL HIGH (ref 15–41)
Albumin: 2.3 g/dL — ABNORMAL LOW (ref 3.5–5.0)
Alkaline Phosphatase: 2462 U/L — ABNORMAL HIGH (ref 38–126)
Anion gap: 10 (ref 5–15)
BLOOD UREA NITROGEN: 39 mg/dL — ABNORMAL HIGH
BUN: 39 mg/dL — ABNORMAL HIGH (ref 6–20)
CALCIUM: 9.3 mg/dL
CHLORIDE: 105 mmol/L
CO2: 14 mmol/L — ABNORMAL LOW (ref 22–32)
CREATININE: 1.78 mg/dL — ABNORMAL HIGH
Calcium: 9.3 mg/dL (ref 8.9–10.3)
Chloride: 105 mmol/L (ref 98–111)
Creatinine, Ser: 1.78 mg/dL — ABNORMAL HIGH (ref 0.61–1.24)
GFR calc Af Amer: >60 mL/min (ref >60)
GFR calc non Af Amer: 54 mL/min — ABNORMAL LOW (ref >60)
GLUCOSE RANDOM: 210 mg/dL — ABNORMAL HIGH
Glucose, Bld: 210 mg/dL — ABNORMAL HIGH (ref 70–99)
POTASSIUM: 4.4 mmol/L
PROTEIN TOTAL: 6.1 g/dL — ABNORMAL LOW
Potassium: 4.4 mmol/L (ref 3.5–5.1)
SODIUM: 129 mmol/L — ABNORMAL LOW
Sodium: 129 mmol/L — ABNORMAL LOW (ref 135–145)
Total Bilirubin: 39.9 mg/dL (ref 0.3–1.2)
Total Protein: 6.1 g/dL — ABNORMAL LOW (ref 6.5–8.1)

## 2019-11-08 LAB — CBC WITH DIFFERENTIAL/PLATELET
Abs Immature Granulocytes: 1.15 10*3/uL — ABNORMAL HIGH (ref 0.00–0.07)
Basophils Absolute: 0 10*3/uL (ref 0.0–0.1)
Basophils Relative: 0 %
Eosinophils Absolute: 0 10*3/uL (ref 0.0–0.5)
Eosinophils Relative: 0 %
HCT: 30.5 % — ABNORMAL LOW (ref 39.0–52.0)
Hemoglobin: 11.4 g/dL — ABNORMAL LOW (ref 13.0–17.0)
Immature Granulocytes: 8 %
Lymphocytes Relative: 3 %
Lymphs Abs: 0.5 10*3/uL — ABNORMAL LOW (ref 0.7–4.0)
MCH: 27.3 pg (ref 26.0–34.0)
MCHC: 37.4 g/dL — ABNORMAL HIGH (ref 30.0–36.0)
MCV: 73.1 fL — ABNORMAL LOW (ref 80.0–100.0)
Monocytes Absolute: 0.8 10*3/uL (ref 0.1–1.0)
Monocytes Relative: 6 %
Neutro Abs: 12.6 10*3/uL — ABNORMAL HIGH (ref 1.7–7.7)
Neutrophils Relative %: 83 %
Platelets: 220 10*3/uL (ref 150–400)
RBC: 4.17 MIL/uL — ABNORMAL LOW (ref 4.22–5.81)
RDW: 28.6 % — ABNORMAL HIGH (ref 11.5–15.5)
WBC: 15.1 10*3/uL — ABNORMAL HIGH (ref 4.0–10.5)
nRBC: 0 % (ref 0.0–0.2)

## 2019-11-08 LAB — GAMMA GT: GGT: 693 U/L — ABNORMAL HIGH (ref 7–50)

## 2019-11-08 LAB — PHOSPHORUS
Lab: 5.7 — ABNORMAL HIGH
PHOSPHORUS: 5.7 mg/dL — ABNORMAL HIGH
Phosphorus: 5.7 mg/dL — ABNORMAL HIGH (ref 2.5–4.6)

## 2019-11-08 LAB — MAGNESIUM
Lab: 1.8
Magnesium: 1.8 mg/dL (ref 1.7–2.4)

## 2019-11-08 LAB — CBC W/ DIFFERENTIAL
BASOPHILS ABSOLUTE COUNT: 0 10*9/L
EOSINOPHILS ABSOLUTE COUNT: 0 10*9/L
HEMATOCRIT: 305 % — ABNORMAL LOW
HEMOGLOBIN: 11.4 g/dL — ABNORMAL LOW
LYMPHOCYTES ABSOLUTE COUNT: 0.5 10*9/L — ABNORMAL LOW
MONOCYTES ABSOLUTE COUNT: 0.8 10*9/L
NEUTROPHILS ABSOLUTE COUNT: 12.5 10*9/L — ABNORMAL HIGH
PLATELET COUNT: 220 10*9/L
WBC ADJUSTED: 15.1 10*9/L — ABNORMAL HIGH

## 2019-11-08 LAB — HYPERCHROMASIA: Lab: 0

## 2019-11-08 LAB — ALBUMIN: Lab: 2.3 — ABNORMAL LOW

## 2019-11-08 LAB — GLUCOSE RANDOM: Lab: 210 — ABNORMAL HIGH

## 2019-11-08 LAB — PROTIME: Lab: 15.2

## 2019-11-08 LAB — GAMMA GLUTAMYL TRANSFERASE: Lab: 693 — ABNORMAL HIGH

## 2019-11-08 MED ORDER — VALGANCICLOVIR 450 MG TABLET
ORAL_TABLET | Freq: Every day | ORAL | 4 refills | 30 days | Status: CP
Start: 2019-11-08 — End: 2019-12-08
  Filled 2019-11-09: qty 30, 30d supply, fill #0

## 2019-11-08 MED ORDER — AQUADEKS 100 MCG-350 MCG-5 MG CHEWABLE TABLET
ORAL_TABLET | Freq: Every day | ORAL | 5 refills | 30 days | Status: CP
Start: 2019-11-08 — End: ?

## 2019-11-08 MED ORDER — RIFAXIMIN 550 MG TABLET
ORAL_TABLET | Freq: Two times a day (BID) | ORAL | 4 refills | 30.00000 days | Status: CP
Start: 2019-11-08 — End: ?
  Filled 2019-11-09: qty 60, 30d supply, fill #0

## 2019-11-08 MED ORDER — MYCOPHENOLATE SODIUM 180 MG TABLET,DELAYED RELEASE
ORAL_TABLET | Freq: Two times a day (BID) | ORAL | 11 refills | 30 days | Status: CP
Start: 2019-11-08 — End: 2020-11-07
  Filled 2019-11-09: qty 180, 30d supply, fill #0

## 2019-11-08 MED ORDER — TACROLIMUS 1 MG CAPSULE
ORAL_CAPSULE | Freq: Two times a day (BID) | ORAL | 0 refills | 30 days | Status: CP
Start: 2019-11-08 — End: 2019-12-08
  Filled 2019-11-28: qty 60, 30d supply, fill #0

## 2019-11-09 DIAGNOSIS — T8641 Liver transplant rejection: Principal | ICD-10-CM

## 2019-11-09 LAB — TACROLIMUS LEVEL: Tacrolimus (FK506) - LabCorp: 8.7 ng/mL (ref 2.0–20.0)

## 2019-11-09 MED ORDER — SULFAMETHOXAZOLE 400 MG-TRIMETHOPRIM 80 MG TABLET
ORAL_TABLET | ORAL | 4 refills | 28 days | Status: CP
Start: 2019-11-09 — End: 2019-12-09
  Filled 2019-11-09: qty 12, 28d supply, fill #0

## 2019-11-09 MED FILL — SULFAMETHOXAZOLE 400 MG-TRIMETHOPRIM 80 MG TABLET: 28 days supply | Qty: 12 | Fill #0 | Status: AC

## 2019-11-09 MED FILL — XIFAXAN 550 MG TABLET: 30 days supply | Qty: 60 | Fill #0 | Status: AC

## 2019-11-09 MED FILL — VALGANCICLOVIR 450 MG TABLET: 30 days supply | Qty: 30 | Fill #0 | Status: AC

## 2019-11-09 MED FILL — MYCOPHENOLATE SODIUM 180 MG TABLET,DELAYED RELEASE: 30 days supply | Qty: 180 | Fill #0 | Status: AC

## 2019-11-09 NOTE — Unmapped (Signed)
12/30: pt's mom aware that [name] Gross is not covered on insurance and referral has been entered to search for assistance. She states he has over a week on hand and would like to check status on this at next call scheduled for next week -Massie Maroon      [facility] Clinical Assessment & Refill Coordination Note    EDWAR COE, DOB: [birth year]  Phone: [phone] (home)     All above HIPAA information was verified with patient's family member, mom [name].     Was a Nurse, learning disability used for this call? No    Specialty Medication(s):   Transplant:  mycophenolic acid 180mg , tacrolimus 1mg , valgancyclovir 450mg  and xifaxan 550mg      Current Outpatient Medications   Medication Sig Dispense Refill   ??? cholecalciferol, vitamin D3, 125 mcg (5,000 unit) tablet Take 1 tablet (5,000 Units total) by mouth daily. 30 tablet 11   ??? clindamycin-benzoyl peroxide 1.2 %(1 % base) -5 % gel Apply to rash on chest (Patient taking differently: Apply 1 application topically nightly. Apply to rash on chest) 45 g 0   ??? magnesium oxide (MAGOX) 400 mg (241.3 mg magnesium) tablet Take 1 tablet (400 mg total) by mouth Two (2) times a day. 60 tablet 1   ??? multivitamin, with zinc (AQUADEKS) 100-5 mcg-mg Chew Chew 2 tablets daily. 60 tablet 5   ??? mycophenolate (MYFORTIC) 180 MG EC tablet Take 3 tablets (540 mg total) by mouth Two (2) times a day. 180 tablet 11   ??? ondansetron (ZOFRAN-ODT) 4 MG disintegrating tablet Take 1 tablet (4 mg total) by mouth every eight (8) hours as needed for up to 7 days. 10 tablet 0   ??? pantoprazole (PROTONIX) 40 MG tablet Take 1 tablet (40 mg total) by mouth daily. 30 tablet 0   ??? predniSONE (DELTASONE) 5 MG tablet Take 3 tablets (15 mg total) by mouth daily. 90 tablet 3   ??? rifAXIMin (XIFAXAN) 550 mg Tab Take 1 tablet (550 mg total) by mouth Two (2) times a day. 60 tablet 4   ??? spironolactone (ALDACTONE) 50 MG tablet Take 1 tablet (50 mg total) by mouth daily. 30 tablet 11 ??? sulfamethoxazole-trimethoprim (BACTRIM) 400-80 mg per tablet Take 1 tablet (80 mg of trimethoprim total) by mouth Every Monday, Wednesday, and Friday. 12 tablet 4   ??? tacrolimus (PROGRAF) 1 MG capsule Take 1 capsule (1 mg total) by mouth two (2) times a day. 60 capsule 0   ??? valGANciclovir (VALCYTE) 450 mg tablet Take 1 tablet (450 mg total) by mouth daily. 30 tablet 4     No current facility-administered medications for this visit.         Changes to medications: see tac below    Allergies   Allergen Reactions   ??? Bee Pollen Rash     Pt has seasonal allergies that cause excessive sneezing and running nose- [name], NAII   ??? Pollen Extracts Rash     Pt has seasonal allergies that cause excessive sneezing and running nose- [name], NAII       Changes to allergies: No    SPECIALTY MEDICATION ADHERENCE     Tacrolimus 1mg   : 3 weeks of medicine on hand   Mycophenolate 180mg   : 5 days of medicine on hand   valganciclovir 450mg   : 5 days of medicine on hand   xifaxan 550mg   : 5 days of medicine on hand     Medication Adherence    Patient reported  X missed doses in the last month: 0  Specialty Medication: mycophenolate 180mg   Patient is on additional specialty medications: Yes  Additional Specialty Medications: valganciclovir 450mg   Patient Reported Additional Medication X Missed Doses in the Last Month: 0  Patient is on more than two specialty medications: Yes  Specialty Medication: xifaxan 550mg   Patient Reported Additional Medication X Missed Doses in the Last Month: 0  Specialty Medication: tacrolimus 1mg   Patient Reported Additional Medication X Missed Doses in the Last Month: 0          Specialty medication(s) dose(s) confirmed: Patient reports changes to the regimen as follows: tac is now 1mg  bid- mom aware and new rx on file     Are there any concerns with adherence? No    Adherence counseling provided? Not needed    CLINICAL MANAGEMENT AND INTERVENTION      Clinical Benefit Assessment: Do you feel the medicine is effective or helping your condition? Yes    Clinical Benefit counseling provided? Not needed    Adverse Effects Assessment:    Are you experiencing any side effects? No    Are you experiencing difficulty administering your medicine? No    Quality of Life Assessment:    How many days over the past month did your transplant  keep you from your normal activities? For example, brushing your teeth or getting up in the morning. 0    Have you discussed this with your provider? Not needed    Therapy Appropriateness:    Is therapy appropriate? Yes, therapy is appropriate and should be continued    DISEASE/MEDICATION-SPECIFIC INFORMATION      N/A    PATIENT SPECIFIC NEEDS     ? Does the patient have any physical, cognitive, or cultural barriers? No    ? Is the patient high risk? Yes, patient is taking a REMS drug. Medication is dispensed in compliance with REMS program.     ? Does the patient require a Care Management Plan? No     ? Does the patient require physician intervention or other additional services (i.e. nutrition, smoking cessation, social work)? No      SHIPPING     Specialty Medication(s) to be Shipped:   Transplant:  mycophenolic acid 180mg , valgancyclovir 450mg  and xifaxan 550mg     Other medication(s) to be shipped: bactrim  Mom wants call back next week on tac and to check aquadeks referral status     Changes to insurance: No    Delivery Scheduled: Yes, Expected medication delivery date: 11/10/2019.     Medication will be delivered via UPS to the confirmed prescription address in [facility].    The patient will receive a drug information handout for each medication shipped and additional FDA Medication Guides as required.  Verified that patient has previously received a Conservation officer, historic buildings.    All of the patient's questions and concerns have been addressed.    Thad Ranger   [facility] Specialty Pharmacist

## 2019-11-09 NOTE — Unmapped (Signed)
Per test claim for AQUADEKS at the [facility], patient needs Medication Assistance Program for Manufacturer Assistance FOR [employer].    [facility] Specialty Medication Onboarding    Specialty Medication: TACROLIMUS 1MG  CAPSULES  Prior Authorization: Not Required   Financial Assistance: No - copay  <$25  Final Copay/Day Supply: $0 / 30 DAYS    Insurance Restrictions: None     Notes to Pharmacist: DOSE CHANGE    The triage team has completed the benefits investigation and has determined that the patient is able to fill this medication at [facility]. Please contact the patient to complete the onboarding or follow up with the prescribing physician as needed.

## 2019-11-14 MED ORDER — DICLOFENAC 1 % TOPICAL GEL
Freq: Two times a day (BID) | TOPICAL | 5 refills | 25 days | Status: CP
Start: 2019-11-14 — End: 2020-11-13

## 2019-11-14 NOTE — Unmapped (Signed)
Received calls from [facility] nurses re: back pain on 12/30, 12/31, and today 1/4 re: back pain.    12/30Lupita Leash RN assessed [name] Gross who was doing well/no changes but still c/o LBP (MSK) worse when he coughs or is active. Has had dry cough x several months which is no worse. Taking acetaminophen PRN not exceeding amt recommended by GI/liver team which helps. Massage also helps, has Ben-gay but hasn't been using it. She recommended more focused attention to massage, stretching, activity and use of prescribed remedies.    12/31Lupita Gross identified PT option for [name] Gross and requested order for PT sessions to help w/back pain and deconditioning. Verbal order provided.    1/4: [name] Forest RN covering for [name] Gross today got call from [name] Gross's mother (he is staying with her for a few days now) that pain is worse, more generalized back pain lower back/shoulders/upper back and some sharper pain with cough. No fever, no change in baseline cough, saturations are stable. Requests muscle relaxant as she wonders if this might help.      Reviewed meds and he is not a good candidate for muscle relaxant b/c of liver disease and likely for oversedation. Would emphasize topical treatments and stretching massage which he is not doing consistently. He may try diclofenac gel sparingly in addition to Ben-gay (use each BID and alternate). Encouraged them to seek care if he has fever/other new symptoms as he may be getting sick. [name] Gross will see him tomorrow and check VS, abdominal girth, weight, etc.     Rx for diclofenac gel to [facility].

## 2019-11-18 NOTE — Unmapped (Signed)
Faxed medication orders to [facility] at [phone]

## 2019-11-22 NOTE — Unmapped (Signed)
Spoke with pt's mother.  Clarified concern about a reported cancelled transplant clinic appointment; this was only his weekly infusion appointments that had been pre-scheduled then cancelled several weeks ago when his therapy needs changed.      Reports ongoing dry cough; unproductive, per home care RN assessment his lungs are clear and SaO2 99%.  Sometimes cough is severe enough to induce vomiting.  Home remedies have been ineffective.  He is planning to get a COVID-19 test tomorrow at [facility].  I asked transplant pharmacy to review his medications for possible adverse effect of chronic cough.

## 2019-11-22 NOTE — Unmapped (Signed)
Received message from pt mother that [name] Gross has continued to cough and he is not getting much sleep.   Explained that hospice is coming today to assess and he is scheduled for a COVID test on Wednesday.

## 2019-11-23 ENCOUNTER — Other Ambulatory Visit: Payer: Medicaid Other

## 2019-11-23 ENCOUNTER — Telehealth: Payer: Self-pay

## 2019-11-23 DIAGNOSIS — R05 Cough: Principal | ICD-10-CM

## 2019-11-23 NOTE — Telephone Encounter (Signed)
Joel Peterson has an appointment today for COVID testing. He has had a lingering cough for the past 2 months and Hospice came to the house today. Lungs are clear and oxygen saturation is 100%. He has no energy which is typical for him at times.  Explained to Mom numbers are reassuring. He has had no sick exposures. Mom wants testing to rule-out COVID.  Briefly discussed with Dr. Lubertha South and she will call patient.

## 2019-11-24 ENCOUNTER — Other Ambulatory Visit: Payer: Self-pay | Admitting: Pediatrics

## 2019-11-24 DIAGNOSIS — R059 Cough, unspecified: Secondary | ICD-10-CM

## 2019-11-24 DIAGNOSIS — R05 Cough: Secondary | ICD-10-CM

## 2019-11-24 NOTE — Unmapped (Signed)
As per patient's nurse coordinator/Marc Lalonde faxed CXR order to [facility] today on the behalf of the patient. Called [facility] to get the fax number.  Was given the fax number of [phone]. Once off the phone, faxed order to the number that was given.

## 2019-11-24 NOTE — Progress Notes (Signed)
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

## 2019-11-25 ENCOUNTER — Ambulatory Visit
Admission: RE | Admit: 2019-11-25 | Discharge: 2019-11-25 | Disposition: A | Discharge status: Home / Self Care | Payer: Medicaid Other | Source: Ambulatory Visit | Attending: Pediatrics | Admitting: Pediatrics

## 2019-11-25 ENCOUNTER — Other Ambulatory Visit: Payer: Self-pay

## 2019-11-25 DIAGNOSIS — R059 Cough, unspecified: Secondary | ICD-10-CM

## 2019-11-25 DIAGNOSIS — R05 Cough: Secondary | ICD-10-CM

## 2019-11-25 NOTE — Unmapped (Signed)
Call received from pt's PCP Leda Min, she reviewed the CXR we had ordered and it is completely clear.  No explanation for his persistent cough on this imaging.  Dr. Lubertha South stated she is in communication with the patient/family regarding symptom management.

## 2019-11-25 NOTE — Unmapped (Signed)
[facility] Refill Coordination Note    Specialty Medication(s) to be Shipped:   Transplant:  mycophenolic acid 180mg , tacrolimus 1mg , valgancyclovir 450mg  and Xifaxan 550mg     Other medication(s) to be shipped:   Bactrim    Tacrolimus 1mg  - Deliver 11/30/19  All other medications - Deliver 12/06/19     [name] Gross, DOB: 2001-09-02  Phone: [phone] (home)       All above HIPAA information was verified with patient's caregiver, Mom     Was a translator used for this call? No    Completed refill call assessment today to schedule patient's medication shipment from the [facility] [phone]).       Specialty medication(s) and dose(s) confirmed: Regimen is correct and unchanged.   Changes to medications: [name] Gross reports no changes at this time.  Changes to insurance: No  Questions for the pharmacist: No    Confirmed patient received Welcome Packet with first shipment. The patient will receive a drug information handout for each medication shipped and additional FDA Medication Guides as required.       DISEASE/MEDICATION-SPECIFIC INFORMATION        N/A    SPECIALTY MEDICATION ADHERENCE     Medication Adherence    Patient reported X missed doses in the last month: 0        Tacrolimus 1mg  : 10 of medicine on hand   Mycophenolate 180mg  : 14 days of medicine on hand   valganciclovir 450mg  : 14 days of medicine on hand   xifaxan 550mg  : 14 days of medicine on hand             SHIPPING     Shipping address confirmed in Epic.     Delivery Scheduled: Yes, Expected medication delivery date: 11/30/19.   Tacrolimus 1mg  - Deliver 11/30/19  All other medications - Deliver 12/06/19    Medication will be delivered via UPS to the prescription address in Epic [facility].    [name] Gross   [facility] Specialty Technician

## 2019-11-25 NOTE — Progress Notes (Signed)
Informed transplant coordinator Jamey Reas at [facility] [phone] and family

## 2019-11-28 MED FILL — TACROLIMUS 1 MG CAPSULE: 30 days supply | Qty: 60 | Fill #0 | Status: AC

## 2019-11-30 DIAGNOSIS — J40 Bronchitis, not specified as acute or chronic: Principal | ICD-10-CM

## 2019-11-30 MED ORDER — FLUTICASONE PROPIONATE 220 MCG/ACTUATION HFA AEROSOL INHALER
2 refills | 0 days
Start: 2019-11-30 — End: ?

## 2019-11-30 MED ORDER — ALBUTEROL SULFATE HFA 90 MCG/ACTUATION AEROSOL INHALER
RESPIRATORY_TRACT | 2 refills | 0.00000 days | PRN
Start: 2019-11-30 — End: 2020-11-29

## 2019-11-30 MED ORDER — AMOXICILLIN 875 MG-POTASSIUM CLAVULANATE 125 MG TABLET
ORAL_TABLET | Freq: Two times a day (BID) | ORAL | 0 refills | 7 days | Status: CP
Start: 2019-11-30 — End: ?

## 2019-11-30 NOTE — Unmapped (Signed)
Spoke with pt's mother Nolon Stalls, she conveyed concern for her ongoing persistent cough, with no pulmonary findings to date.  Cough is frequently inducing vomiting and he has increasingly been unable to take in food over the last 2 weeks.  He is hungry.  Per discussion with Dr. 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  I asked if bronchodilator was indicated.

## 2019-11-30 NOTE — Unmapped (Signed)
Insurance has been verified due to 2021 New Year.   ??  ??Patient has active coverage with Medicaid, including prescription drug coverage   ??  Authorization is not required for inpatient/outpatient Liver transplant evaluation.   ??  ??Patient is financially cleared for Liver transplant evaluation

## 2019-11-30 NOTE — Unmapped (Signed)
[name] Gross continues to cough (chronic, previously dry and mostly at night, sometimes seemed reflux related), now notably while eating. 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 He has not had any emesis, hematemesis, melena, etc.    PCP ordered a CXR which was clear.     Mother was advised by hospice nurse to monitor him closely while eating and mother will ask about any other symptoms of swallow dysfunction. Going to treat with a short course of abx for possible bronchitis just to be on the safe side.    Dr. Melrose Nakayama, any other suggestions for them?  Mom's concern about a possible relation to varices worries me a bit - just grade I in November.

## 2019-12-01 NOTE — Unmapped (Signed)
MD order for Tessalon Pearls signed by Dr. Jessee Avers and faxed to [facility] at [phone] by our office staff.

## 2019-12-05 MED FILL — XIFAXAN 550 MG TABLET: ORAL | 30 days supply | Qty: 60 | Fill #1

## 2019-12-05 MED FILL — MYCOPHENOLATE SODIUM 180 MG TABLET,DELAYED RELEASE: ORAL | 30 days supply | Qty: 180 | Fill #1

## 2019-12-05 MED FILL — XIFAXAN 550 MG TABLET: 30 days supply | Qty: 60 | Fill #1 | Status: AC

## 2019-12-05 MED FILL — SULFAMETHOXAZOLE 400 MG-TRIMETHOPRIM 80 MG TABLET: ORAL | 28 days supply | Qty: 12 | Fill #1

## 2019-12-05 MED FILL — VALGANCICLOVIR 450 MG TABLET: ORAL | 30 days supply | Qty: 30 | Fill #1

## 2019-12-05 MED FILL — SULFAMETHOXAZOLE 400 MG-TRIMETHOPRIM 80 MG TABLET: 28 days supply | Qty: 12 | Fill #1 | Status: AC

## 2019-12-05 MED FILL — VALGANCICLOVIR 450 MG TABLET: 30 days supply | Qty: 30 | Fill #1 | Status: AC

## 2019-12-05 MED FILL — MYCOPHENOLATE SODIUM 180 MG TABLET,DELAYED RELEASE: 30 days supply | Qty: 180 | Fill #1 | Status: AC

## 2019-12-05 NOTE — Unmapped (Signed)
Received fax from [facility]. Forwarded form to Jamey Reas, liver transplant coordinator.

## 2019-12-08 NOTE — Unmapped (Signed)
Spoke to patient's mother regarding patient's persistent cough that causes vomiting.  She was concerned for possible blood in his vomit, but it had been sitting out for several hours when she first saw it, and it was difficult to assess the color.  Patient's blood pressure is at his baseline, he is alert and oriented, and has a good appetite, has already eaten a meal this morning.  She expresses a strong desire to keep him out of the emergency room to avoid COVID.  Nurse coordinator agrees that his symptoms do not currently warrant a trip to the ED.

## 2019-12-09 NOTE — Unmapped (Signed)
Faxed orders for flovent to [facility] at [phone]

## 2019-12-10 ENCOUNTER — Emergency Department (HOSPITAL_COMMUNITY): Payer: Medicaid Other

## 2019-12-10 ENCOUNTER — Other Ambulatory Visit: Payer: Self-pay

## 2019-12-10 ENCOUNTER — Emergency Department (HOSPITAL_COMMUNITY)
Admission: EM | Admit: 2019-12-10 | Discharge: 2019-12-11 | Disposition: A | Discharge status: Other Acute Inpatient Hospital | Payer: Medicaid Other | Attending: Emergency Medicine | Admitting: Emergency Medicine

## 2019-12-10 DIAGNOSIS — Z20822 Contact with and (suspected) exposure to covid-19: Secondary | ICD-10-CM | POA: Diagnosis not present

## 2019-12-10 DIAGNOSIS — E876 Hypokalemia: Secondary | ICD-10-CM | POA: Insufficient documentation

## 2019-12-10 DIAGNOSIS — Z944 Liver transplant status: Secondary | ICD-10-CM | POA: Diagnosis not present

## 2019-12-10 DIAGNOSIS — Z794 Long term (current) use of insulin: Secondary | ICD-10-CM | POA: Diagnosis not present

## 2019-12-10 DIAGNOSIS — N179 Acute kidney failure, unspecified: Secondary | ICD-10-CM | POA: Insufficient documentation

## 2019-12-10 DIAGNOSIS — K7210 Chronic hepatic failure without coma: Secondary | ICD-10-CM | POA: Insufficient documentation

## 2019-12-10 DIAGNOSIS — R579 Shock, unspecified: Secondary | ICD-10-CM | POA: Insufficient documentation

## 2019-12-10 DIAGNOSIS — Z79899 Other long term (current) drug therapy: Secondary | ICD-10-CM | POA: Insufficient documentation

## 2019-12-10 DIAGNOSIS — R1110 Vomiting, unspecified: Secondary | ICD-10-CM | POA: Diagnosis present

## 2019-12-10 LAB — I-STAT CHEM 8, ED
BUN: 26 mg/dL — ABNORMAL HIGH (ref 6–20)
Calcium, Ion: 1.09 mmol/L — ABNORMAL LOW (ref 1.15–1.40)
Chloride: 102 mmol/L (ref 98–111)
Creatinine, Ser: 2.7 mg/dL — ABNORMAL HIGH (ref 0.61–1.24)
Glucose, Bld: 136 mg/dL — ABNORMAL HIGH (ref 70–99)
HCT: 26 % — ABNORMAL LOW (ref 39.0–52.0)
Hemoglobin: 8.8 g/dL — ABNORMAL LOW (ref 13.0–17.0)
Potassium: 2.9 mmol/L — ABNORMAL LOW (ref 3.5–5.1)
Sodium: 132 mmol/L — ABNORMAL LOW (ref 135–145)
TCO2: 19 mmol/L — ABNORMAL LOW (ref 22–32)

## 2019-12-10 LAB — CBC WITH DIFFERENTIAL/PLATELET
Abs Immature Granulocytes: 1.81 10*3/uL — ABNORMAL HIGH (ref 0.00–0.07)
Basophils Absolute: 0.1 10*3/uL (ref 0.0–0.1)
Basophils Relative: 0 %
Eosinophils Absolute: 0.1 10*3/uL (ref 0.0–0.5)
Eosinophils Relative: 0 %
HCT: 24.1 % — ABNORMAL LOW (ref 39.0–52.0)
Hemoglobin: 9 g/dL — ABNORMAL LOW (ref 13.0–17.0)
Immature Granulocytes: 14 %
Lymphocytes Relative: 9 %
Lymphs Abs: 1.2 10*3/uL (ref 0.7–4.0)
MCH: 29.6 pg (ref 26.0–34.0)
MCHC: 37.3 g/dL — ABNORMAL HIGH (ref 30.0–36.0)
MCV: 79.3 fL — ABNORMAL LOW (ref 80.0–100.0)
Monocytes Absolute: 1.8 10*3/uL — ABNORMAL HIGH (ref 0.1–1.0)
Monocytes Relative: 13 %
Neutro Abs: 8.6 10*3/uL — ABNORMAL HIGH (ref 1.7–7.7)
Neutrophils Relative %: 64 %
Platelets: 151 10*3/uL (ref 150–400)
RBC: 3.04 MIL/uL — ABNORMAL LOW (ref 4.22–5.81)
RDW: 27.1 % — ABNORMAL HIGH (ref 11.5–15.5)
WBC: 13.4 10*3/uL — ABNORMAL HIGH (ref 4.0–10.5)
nRBC: 0.4 % — ABNORMAL HIGH (ref 0.0–0.2)

## 2019-12-10 LAB — URINALYSIS, ROUTINE W REFLEX MICROSCOPIC
Glucose, UA: NEGATIVE mg/dL
Hgb urine dipstick: NEGATIVE
Ketones, ur: NEGATIVE mg/dL
Leukocytes,Ua: NEGATIVE
Nitrite: NEGATIVE
Protein, ur: NEGATIVE mg/dL
Specific Gravity, Urine: 1.01 (ref 1.005–1.030)
pH: 6 (ref 5.0–8.0)

## 2019-12-10 LAB — COMPREHENSIVE METABOLIC PANEL
ALT: 266 U/L — ABNORMAL HIGH (ref 0–44)
AST: 419 U/L — ABNORMAL HIGH (ref 15–41)
Albumin: 1.7 g/dL — ABNORMAL LOW (ref 3.5–5.0)
Alkaline Phosphatase: 1670 U/L — ABNORMAL HIGH (ref 38–126)
Anion gap: 13 (ref 5–15)
BUN: 20 mg/dL (ref 6–20)
CO2: 15 mmol/L — ABNORMAL LOW (ref 22–32)
Calcium: 8.3 mg/dL — ABNORMAL LOW (ref 8.9–10.3)
Chloride: 103 mmol/L (ref 98–111)
Creatinine, Ser: 2.69 mg/dL — ABNORMAL HIGH (ref 0.61–1.24)
GFR calc Af Amer: 38 mL/min — ABNORMAL LOW (ref >60)
GFR calc non Af Amer: 33 mL/min — ABNORMAL LOW (ref >60)
Glucose, Bld: 146 mg/dL — ABNORMAL HIGH (ref 70–99)
Potassium: 2.7 mmol/L — CL (ref 3.5–5.1)
Sodium: 131 mmol/L — ABNORMAL LOW (ref 135–145)
Total Bilirubin: 35.3 mg/dL (ref 0.3–1.2)
Total Protein: 4.5 g/dL — ABNORMAL LOW (ref 6.5–8.1)

## 2019-12-10 LAB — AMMONIA: Ammonia: 56 umol/L — ABNORMAL HIGH (ref 9–35)

## 2019-12-10 LAB — RESPIRATORY PANEL BY RT PCR (FLU A&B, COVID)
Influenza A by PCR: NEGATIVE
Influenza B by PCR: NEGATIVE
SARS Coronavirus 2 by RT PCR: NEGATIVE

## 2019-12-10 LAB — LACTIC ACID, PLASMA
Lactic Acid, Venous: 2 mmol/L (ref 0.5–1.9)
Lactic Acid, Venous: 1.9 mmol/L (ref 0.5–1.9)

## 2019-12-10 LAB — PROTIME-INR
INR: 1.6 — ABNORMAL HIGH (ref 0.8–1.2)
Prothrombin Time: 18.7 seconds — ABNORMAL HIGH (ref 11.4–15.2)

## 2019-12-10 LAB — APTT: aPTT: 49 seconds — ABNORMAL HIGH (ref 24–36)

## 2019-12-10 MED ORDER — MAGNESIUM SULFATE 2 GM/50ML IV SOLN
2.0000 g | INTRAVENOUS | Status: AC
  Administered 2019-12-10: 2 g via INTRAVENOUS
  Filled 2019-12-10: qty 50

## 2019-12-10 MED ORDER — LACTATED RINGERS IV BOLUS (SEPSIS)
1000.0000 mL | Freq: Once | INTRAVENOUS | Status: AC
  Administered 2019-12-10: 1000 mL via INTRAVENOUS

## 2019-12-10 MED ORDER — METOCLOPRAMIDE HCL 5 MG/ML IJ SOLN
10.0000 mg | Freq: Once | INTRAMUSCULAR | Status: AC
  Administered 2019-12-10: 10 mg via INTRAVENOUS
  Filled 2019-12-10: qty 2

## 2019-12-10 MED ORDER — LACTATED RINGERS IV BOLUS (SEPSIS)
500.0000 mL | Freq: Once | INTRAVENOUS | Status: AC
  Administered 2019-12-10: 500 mL via INTRAVENOUS

## 2019-12-10 MED ORDER — METRONIDAZOLE IN NACL 5-0.79 MG/ML-% IV SOLN
500.0000 mg | Freq: Once | INTRAVENOUS | Status: AC
  Administered 2019-12-10: 500 mg via INTRAVENOUS
  Filled 2019-12-10: qty 100

## 2019-12-10 MED ORDER — SODIUM CHLORIDE 0.9 % IV BOLUS
500.0000 mL | Freq: Once | INTRAVENOUS | Status: AC
  Administered 2019-12-10: 500 mL via INTRAVENOUS

## 2019-12-10 MED ORDER — POTASSIUM CHLORIDE 10 MEQ/100ML IV SOLN
10.0000 meq | Freq: Once | INTRAVENOUS | Status: AC
  Administered 2019-12-10: 10 meq via INTRAVENOUS
  Filled 2019-12-10: qty 100

## 2019-12-10 MED ORDER — LACTATED RINGERS IV BOLUS (SEPSIS)
250.0000 mL | Freq: Once | INTRAVENOUS | Status: AC
  Administered 2019-12-10: 250 mL via INTRAVENOUS

## 2019-12-10 MED ORDER — SODIUM CHLORIDE 0.9 % IV SOLN
2.0000 g | Freq: Once | INTRAVENOUS | Status: AC
  Administered 2019-12-10: 2 g via INTRAVENOUS
  Filled 2019-12-10: qty 20

## 2019-12-10 NOTE — ED Notes (Signed)
MD made aware of pts BP being low. Advised to start 500NS bolus.

## 2019-12-10 NOTE — ED Provider Notes (Signed)
[facility] EMERGENCY DEPARTMENT Provider Note   CSN: [id number] Arrival date & time: 12/10/19  1829     History Chief Complaint  Patient presents with   Emesis    Rosemary Q Leichter is a 19 y.o. male.  HPI   This patient is a 19 year old male, history of cirrhosis, history of esophageal varices with bleeding in the past, history of failed liver transplant who presents critically ill with 1 week of persistent vomiting, severe weakness, no urine or stool passing today.  The patient states that he is followed by the transplant center at the [facility] however was told that he would not be able to get another transplant unless he became stronger.  He does not know what that means.  He reports that over the last week he has had persistent vomiting which started out as yellow and has now become brown, liquid, no diarrhea, he is now not had any stools and has not passed any urine today.  He only has abdominal pain when he vomits, he is try to eat and drink but everything comes back up.  He denies anybody else in the house has been sick, he has been tested himself multiple times for Covid as has his family, his mother is a Engineer, civil (consulting) and has been tested and is negative.  He does have the occasional cough, occasional shortness of breath, denies fevers or chills.  The patient is severely jaundiced and states that that is normal for him.  Past Medical History:  Diagnosis Date   Cirrhosis (HCC)    Depression    Eosinophilic esophagitis    Esophageal varices with bleeding (HCC)    Seasonal allergies    Sickle cell trait (HCC)    Status post liver transplant (HCC)    Suicide attempt by drug ingestion (HCC)    Transaminitis    Varices, esophageal (HCC)     Patient Active Problem List   Diagnosis Date Noted   Immunocompromised (HCC) 01/11/2019   Fever of unknown origin 01/11/2019   Electrolyte disturbance 02/06/2018   Ingestion of substance  01/07/2018   Acute rejection of liver transplant (HCC) 08/20/2017   History of suicide attempt 03/30/2017   Liver transplant recipient [facility])    Urinary retention    Overdose 01/20/2017   Severe episode of recurrent major depressive disorder, without psychotic features (HCC)    Suicide attempt (HCC)    Eosinophilic esophagitis 09/24/2016   Status post liver transplant (HCC) 09/22/2016   Adjustment disorder with anxious mood 05/20/2016   Anemia 05/12/2016   Hypotension due to blood loss 05/12/2016   Bleeding esophageal varices (HCC)    Lactic acidosis    Cirrhosis (HCC) 05/04/2016   Hepatic cirrhosis (HCC) 05/03/2016   Hematemesis 05/02/2016   Orthostatic hypotension 05/02/2016   Transaminitis 05/02/2016   Depression 03/19/2016   Sickle cell trait (HCC) 06/08/2014   BMI (body mass index), pediatric, 5% to less than 85% for age 46/16/2015   Failed vision screen 05/25/2014   Acne 05/25/2014   Allergic rhinitis 02/26/2014    Past Surgical History:  Procedure Laterality Date   esophageal banding     LIVER TRANSPLANT     TIPS PROCEDURE         Family History  Problem Relation Age of Onset   Hypertension Maternal Grandmother    Diabetes Maternal Grandmother    Hypertension Maternal Grandfather    Diabetes Maternal Grandfather    Diabetes Paternal Grandmother    Hypertension Paternal  Grandmother    Diabetes Paternal Grandfather    Hypertension Paternal Grandfather     Social History   Tobacco Use   Smoking status: Never Smoker   Smokeless tobacco: Never Used  Substance Use Topics   Alcohol use: No    Alcohol/week: 0.0 standard drinks   Drug use: No    Home Medications Prior to Admission medications   Medication Sig Start Date End Date Taking? Authorizing Provider  acetaminophen (TYLENOL) 325 MG tablet Take 650 mg by mouth every 6 (six) hours as needed for fever.    [provider]  cetirizine (ZYRTEC) 10 MG tablet  Take 1 tablet (10 mg total) by mouth daily as needed for allergies. 03/04/19   Ettefagh, Aron BabaKate Scott, MD  Cholecalciferol (VITAMIN D3) 125 MCG (5000 UT) TABS Take by mouth. 09/07/19 09/06/20  [provider]  cholestyramine light (PREVALITE) 4 g packet Take 1 packet (4 g total) by mouth 2 (two) times daily. Patient not taking: Reported on 01/19/2017 08/08/16   Earley FavorSchulz, Gail, NP  fluticasone [facility](FLONASE) 50 MCG/ACT nasal spray Place 1-2 sprays into both nostrils daily. 03/04/19   Ettefagh, Aron BabaKate Scott, MD  Glucagon 0.5 MG/0.1ML SOSY Inject into the skin. 09/07/19   [provider]  glucose blood (PRECISION QID TEST) test strip Use to check blood sugar four times daily as directed. 09/07/19   [provider]  Insulin Syringe-Needle U-100 (BD INSULIN SYRINGE U/F 1/2UNIT) 31G X 5/16" 0.3 ML MISC Use to inject insulins up to 6 times daily as directed. 09/07/19   [provider]  magnesium oxide (MAG-OX) 400 MG tablet Take by mouth. 09/09/19 01/07/20  [provider]  mycophenolate (MYFORTIC) 180 MG EC tablet Take by mouth. 09/07/19 09/06/20  [provider]  mycophenolate (MYFORTIC) 360 MG TBEC EC tablet Take 1,080 mg by mouth 2 (two) times daily.     [provider]  ondansetron (ZOFRAN-ODT) 4 MG disintegrating tablet Take by mouth. 09/07/19   [provider]  pantoprazole (PROTONIX) 40 MG tablet Take 1 tablet (40 mg total) by mouth daily. Patient not taking: Reported on 01/19/2017 05/02/16   Mittie BodoBarnett, Elyse Paige, MD  ranitidine (ZANTAC) 150 MG tablet Take 1 tablet (150 mg total) by mouth at bedtime. Patient not taking: Reported on 01/19/2017 05/02/16   Mittie BodoBarnett, Elyse Paige, MD  rifaximin [employer] [employer](XIFAXAN) 550 MG TABS tablet Take by mouth. 09/29/19   [provider]  sucralfate (CARAFATE) 1 g tablet Take 1 tablet (1 g total) by mouth 4 (four) times daily -  with meals and at bedtime. Patient not taking: Reported on 01/19/2017 05/02/16   Mittie BodoBarnett,  Elyse Paige, MD  tacrolimus (PROGRAF) 1 MG capsule Take 5 mg by mouth 2 (two) times daily.     [provider]    Allergies    Pollen extract  Review of Systems   Review of Systems  All other systems reviewed and are negative.   Physical Exam Updated Vital Signs BP (!) 88/51    Pulse 90    Temp 98.3 F (36.8 C)    Resp 18    SpO2 100%   Physical Exam Vitals and nursing note reviewed.  Constitutional:      Appearance: He is well-developed. He is ill-appearing and toxic-appearing. He is not diaphoretic.  HENT:     Head: Normocephalic and atraumatic.     Mouth/Throat:     Mouth: Mucous membranes are dry.     Pharynx: No oropharyngeal exudate.     Comments:  Jaundiced mucous membranes Eyes:     General: Scleral icterus present.        Right eye: No discharge.        Left eye: No discharge.     Pupils: Pupils are equal, round, and reactive to light.  Neck:     Thyroid: No thyromegaly.     Vascular: No JVD.  Cardiovascular:     Rate and Rhythm: Regular rhythm. Tachycardia present.     Heart sounds: Normal heart sounds. No murmur. No friction rub. No gallop.   Pulmonary:     Effort: Pulmonary effort is normal. No respiratory distress.     Breath sounds: Normal breath sounds. No wheezing or rales.  Abdominal:     General: Bowel sounds are normal. There is no distension.     Palpations: Abdomen is soft. There is no mass.     Tenderness: There is abdominal tenderness.     Comments: Mild diffuse tenderness to palpation  Musculoskeletal:        General: No tenderness. Normal range of motion.     Cervical back: Normal range of motion and neck supple.     Right lower leg: No edema.     Left lower leg: No edema.  Lymphadenopathy:     Cervical: No cervical adenopathy.  Skin:    General: Skin is warm and dry.     Coloration: Skin is jaundiced.     Findings: No erythema or rash.  Neurological:     Mental Status: He is alert.     Coordination: Coordination normal.      Comments: Severe generalized weakness but awake and alert  Psychiatric:        Behavior: Behavior normal.     ED Results / Procedures / Treatments   Labs (all labs ordered are listed, but only abnormal results are displayed) Labs Reviewed  LACTIC ACID, PLASMA - Abnormal; Notable for the following components:      Result Value   Lactic Acid, Venous 2.0 (*)    All other components within normal limits  COMPREHENSIVE METABOLIC PANEL - Abnormal; Notable for the following components:   Sodium 131 (*)    Potassium 2.7 (*)    CO2 15 (*)    Glucose, Bld 146 (*)    Creatinine, Ser 2.69 (*)    Calcium 8.3 (*)    Total Protein 4.5 (*)    Albumin 1.7 (*)    AST 419 (*)    ALT 266 (*)    Alkaline Phosphatase 1,670 (*)    Total Bilirubin 35.3 (*)    GFR calc non Af Amer 33 (*)    GFR calc Af Amer 38 (*)    All other components within normal limits  CBC WITH DIFFERENTIAL/PLATELET - Abnormal; Notable for the following components:   WBC 13.4 (*)    RBC 3.04 (*)    Hemoglobin 9.0 (*)    HCT 24.1 (*)    MCV 79.3 (*)    MCHC 37.3 (*)    RDW 27.1 (*)    nRBC 0.4 (*)    Neutro Abs 8.6 (*)    Monocytes Absolute 1.8 (*)    Abs Immature Granulocytes 1.81 (*)    All other components within normal limits  APTT - Abnormal; Notable for the following components:   aPTT 49 (*)    All other components within normal limits  PROTIME-INR - Abnormal; Notable for the following components:   Prothrombin Time 18.7 (*)    INR  1.6 (*)    All other components within normal limits  AMMONIA - Abnormal; Notable for the following components:   Ammonia 56 (*)    All other components within normal limits  I-STAT CHEM 8, ED - Abnormal; Notable for the following components:   Sodium 132 (*)    Potassium 2.9 (*)    BUN 26 (*)    Creatinine, Ser 2.70 (*)    Glucose, Bld 136 (*)    Calcium, Ion 1.09 (*)    TCO2 19 (*)    Hemoglobin 8.8 (*)    HCT 26.0 (*)    All other components within normal limits    CULTURE, BLOOD (ROUTINE X 2)  URINE CULTURE  RESPIRATORY PANEL BY RT PCR (FLU A&B, COVID)  LACTIC ACID, PLASMA  URINALYSIS, ROUTINE W REFLEX MICROSCOPIC  PATHOLOGIST SMEAR REVIEW    EKG EKG Interpretation  Date/Time:  Saturday December 10 2019 19:04:46 EST Ventricular Rate:  97 PR Interval:    QRS Duration: 84 QT Interval:  445 QTC Calculation: 566 R Axis:   76 Text Interpretation: Sinus rhythm Prolonged QT interval Since last tracing QT has lengthened Abnormal ekg Confirmed by Eber Hong ([id number]) on 12/10/2019 7:21:44 PM   Radiology DG Chest Port 1 View  Result Date: 12/10/2019 CLINICAL DATA:  Vomiting x1 week. EXAM: PORTABLE CHEST 1 VIEW COMPARISON:  November 25, 2019 FINDINGS: The heart size and mediastinal contours are within normal limits. Both lungs are clear. The visualized skeletal structures are unremarkable. IMPRESSION: No active disease. Electronically Signed   By: Aram Candela M.D.   On: 12/10/2019 19:47    Procedures .Critical Care Performed by: Eber Hong, MD Authorized by: Eber Hong, MD   Critical care provider statement:    Critical care time (minutes):  80   Critical care time was exclusive of:  Separately billable procedures and treating other patients and teaching time   Critical care was necessary to treat or prevent imminent or life-threatening deterioration of the following conditions:  Shock   Critical care was time spent personally by me on the following activities:  Blood draw for specimens, development of treatment plan with patient or surrogate, discussions with consultants, evaluation of patient's response to treatment, examination of patient, obtaining history from patient or surrogate, ordering and performing treatments and interventions, ordering and review of laboratory studies, ordering and review of radiographic studies, pulse oximetry, re-evaluation of patient's condition and review of old charts Comments:       Ultrasound ED  Peripheral IV (Provider)  Date/Time: 12/10/2019 7:23 PM Performed by: Eber Hong, MD Authorized by: Eber Hong, MD   Procedure details:    Indications: hypotension and poor IV access     Location:  Right AC   Angiocath:  20 G   Bedside Ultrasound Guided: Yes     Images: not archived     Patient tolerated procedure without complications: Yes     Dressing applied: Yes   Comments:         (including critical care time)  Medications Ordered in ED Medications  cefTRIAXone (ROCEPHIN) 2 g in sodium chloride 0.9 % 100 mL IVPB (2 g Intravenous New Bag/Given 12/10/19 2112)  potassium chloride 10 mEq in 100 mL IVPB (has no administration in time range)  magnesium sulfate IVPB 2 g 50 mL (has no administration in time range)  lactated ringers bolus 1,000 mL (0 mLs Intravenous Stopped 12/10/19 2112)    And  lactated ringers bolus 500 mL (0 mLs Intravenous Stopped  12/10/19 2043)    And  lactated ringers bolus 250 mL (0 mLs Intravenous Stopped 12/10/19 2043)  metoCLOPramide (REGLAN) injection 10 mg (10 mg Intravenous Given 12/10/19 1934)  metroNIDAZOLE (FLAGYL) IVPB 500 mg (0 mg Intravenous Stopped 12/10/19 2112)    ED Course  I have reviewed the triage vital signs and the nursing notes.  Pertinent labs & imaging results that were available during my care of the patient were reviewed by me and considered in my medical decision making (see chart for details).    MDM Rules/Calculators/A&P                       This patient has what appears to be acute on chronic disease. He has baseline severe liver failure, the mother is now arrived and states that he had not been taking his medication for quite some time, he decompensated severely in October when he was admitted to the [facility]. Since that time he is maintained on 15 mg of prednisone as well as his antirejection medications with the understanding that his liver had been rejected, at this time the patient's  blood pressure is 64/40, he is awake and alert and agreed to let me place a peripheral IV. I placed an ultrasound-guided 20-gauge IV in the right antecubital fossa, fluids of been ordered at 30 cc/kg. I suspect that he is severely dehydrated. The patient is critically ill  I discussed the care with the [facility] transfer center who connected me with the PICU fellow's, I spoke with Dr. Hannah Beat, who has accepted the patient to the ICU, the patient has hypokalemia getting magnesium and potassium, has responded to the fluid resuscitation and now has a blood pressure in the 90s as well as heart rate in the 90s.  His lab work shows a worsening renal function with acute on chronic kidney failure as well as acute on chronic liver failure.  He has multiorgan system dysfunction and is critically ill.  He has been accepted to the PICU  More fluid necessary - BP sliding from 80's to 90's and back to 80's.  Antibiotics infused and now getting Mag and K.    9:25 PM Cardiac monitoring reveals sinus tachycardia rate of 105 (Rate & rhythm), as reviewed and interpreted by me. Cardiac monitoring was ordered due to hypotension and shock and to monitor patient for dysrhythmia.    Final Clinical Impression(s) / ED Diagnoses Final diagnoses:  Acute renal failure, unspecified acute renal failure type ([place])  Shock ([place])  Chronic liver failure without hepatic coma ([place])  Hypokalemia      Noemi Chapel, MD 12/10/19 2229

## 2019-12-10 NOTE — ED Triage Notes (Signed)
Pt BIB [facility] from home. Pt complaining of emesis x1 week. Pt states emesis is brown in color. Pt denies abdominal pain. No emesis with EMS. VSS. NAD.

## 2019-12-10 NOTE — ED Notes (Signed)
[facility] here to pick up patient

## 2019-12-11 ENCOUNTER — Ambulatory Visit
Admit: 2019-12-11 | Discharge: 2019-12-23 | Disposition: A | Discharge status: Home / Self Care | Payer: PRIVATE HEALTH INSURANCE | Source: Other Acute Inpatient Hospital

## 2019-12-11 ENCOUNTER — Encounter
Admit: 2019-12-11 | Discharge: 2019-12-23 | Disposition: A | Discharge status: Home / Self Care | Payer: PRIVATE HEALTH INSURANCE | Source: Other Acute Inpatient Hospital | Attending: Nurse Practitioner

## 2019-12-11 LAB — CBC W/ AUTO DIFF
BASOPHILS ABSOLUTE COUNT: 0.1 10*9/L (ref 0.0–0.1)
BASOPHILS RELATIVE PERCENT: 1.7 %
BASOPHILS RELATIVE PERCENT: 1 %
EOSINOPHILS ABSOLUTE COUNT: 0.1 10*9/L (ref 0.0–0.4)
EOSINOPHILS ABSOLUTE COUNT: 0 10*9/L (ref 0.0–0.4)
EOSINOPHILS RELATIVE PERCENT: 0.5 %
EOSINOPHILS RELATIVE PERCENT: 0.2 %
HEMATOCRIT: 23.1 % — ABNORMAL LOW (ref 41.0–53.0)
HEMATOCRIT: 28.7 % — ABNORMAL LOW (ref 41.0–53.0)
HEMOGLOBIN: 7.7 g/dL — ABNORMAL LOW (ref 13.5–17.5)
HEMOGLOBIN: 9.8 g/dL — ABNORMAL LOW (ref 13.5–17.5)
LARGE UNSTAINED CELLS: 2 % (ref 0–4)
LARGE UNSTAINED CELLS: 1 % (ref 0–4)
LYMPHOCYTES ABSOLUTE COUNT: 1.3 10*9/L — ABNORMAL LOW (ref 1.5–5.0)
LYMPHOCYTES RELATIVE PERCENT: 11.5 %
LYMPHOCYTES RELATIVE PERCENT: 4.1 %
MEAN CORPUSCULAR HEMOGLOBIN CONC: 33.2 g/dL (ref 31.0–37.0)
MEAN CORPUSCULAR HEMOGLOBIN CONC: 34 g/dL (ref 31.0–37.0)
MEAN CORPUSCULAR HEMOGLOBIN: 27.9 pg (ref 26.0–34.0)
MEAN CORPUSCULAR HEMOGLOBIN: 29.4 pg (ref 26.0–34.0)
MEAN CORPUSCULAR VOLUME: 84 fL (ref 80.0–100.0)
MEAN CORPUSCULAR VOLUME: 86.4 fL (ref 80.0–100.0)
MONOCYTES ABSOLUTE COUNT: 0.9 10*9/L — ABNORMAL HIGH (ref 0.2–0.8)
MONOCYTES ABSOLUTE COUNT: 0.4 10*9/L (ref 0.2–0.8)
MONOCYTES RELATIVE PERCENT: 8.1 %
MONOCYTES RELATIVE PERCENT: 3.7 %
NEUTROPHILS ABSOLUTE COUNT: 8.2 10*9/L — ABNORMAL HIGH (ref 2.0–7.5)
NEUTROPHILS ABSOLUTE COUNT: 9.2 10*9/L — ABNORMAL HIGH (ref 2.0–7.5)
NEUTROPHILS RELATIVE PERCENT: 90.4 %
NUCLEATED RED BLOOD CELLS: 1 /100{WBCs} (ref <=4)
PLATELET COUNT: 95 10*9/L — ABNORMAL LOW (ref 150–440)
PLATELET COUNT: 107 10*9/L — ABNORMAL LOW (ref 150–440)
RED BLOOD CELL COUNT: 2.75 10*12/L — ABNORMAL LOW (ref 4.50–5.90)
RED BLOOD CELL COUNT: 3.32 10*12/L — ABNORMAL LOW (ref 4.50–5.90)
RED CELL DISTRIBUTION WIDTH: 24.8 % — ABNORMAL HIGH (ref 12.0–15.0)
RED CELL DISTRIBUTION WIDTH: 22.8 % — ABNORMAL HIGH (ref 12.0–15.0)
WBC ADJUSTED: 10.8 10*9/L (ref 4.5–11.0)
WBC ADJUSTED: 10.1 10*9/L (ref 4.5–11.0)

## 2019-12-11 LAB — TACROLIMUS, TROUGH: Lab: 7

## 2019-12-11 LAB — BLOOD GAS CRITICAL CARE PANEL, VENOUS
BASE EXCESS VENOUS: -8.2 — ABNORMAL LOW (ref -2.0–2.0)
BASE EXCESS VENOUS: -7.6 — ABNORMAL LOW (ref -2.0–2.0)
BASE EXCESS VENOUS: -9.3 — ABNORMAL LOW (ref -2.0–2.0)
CALCIUM IONIZED VENOUS (MG/DL): 4.62 mg/dL (ref 4.40–5.40)
CALCIUM IONIZED VENOUS (MG/DL): 4.72 mg/dL (ref 4.40–5.40)
CALCIUM IONIZED VENOUS (MG/DL): 4.52 mg/dL (ref 4.40–5.40)
GLUCOSE WHOLE BLOOD: 125 mg/dL (ref 70–179)
GLUCOSE WHOLE BLOOD: 112 mg/dL (ref 70–179)
HCO3 VENOUS: 17 mmol/L — ABNORMAL LOW (ref 22–27)
HCO3 VENOUS: 17 mmol/L — ABNORMAL LOW (ref 22–27)
HCO3 VENOUS: 16 mmol/L — ABNORMAL LOW (ref 22–27)
HEMOGLOBIN BLOOD GAS: 7 g/dL — ABNORMAL LOW (ref 13.50–17.50)
HEMOGLOBIN BLOOD GAS: 9.1 g/dL — ABNORMAL LOW (ref 13.50–17.50)
LACTATE BLOOD VENOUS: 1.7 mmol/L (ref 0.5–1.8)
LACTATE BLOOD VENOUS: 1.5 mmol/L (ref 0.5–1.8)
LACTATE BLOOD VENOUS: 1.3 mmol/L (ref 0.5–1.8)
O2 SATURATION VENOUS: 76.2 % (ref 40.0–85.0)
PCO2 VENOUS: 35 mmHg — ABNORMAL LOW (ref 40–60)
PCO2 VENOUS: 29 mmHg — ABNORMAL LOW (ref 40–60)
PH VENOUS: 7.31 — ABNORMAL LOW (ref 7.32–7.43)
PH VENOUS: 7.38 (ref 7.32–7.43)
PH VENOUS: 7.32 (ref 7.32–7.43)
PO2 VENOUS: 30 mmHg (ref 30–55)
PO2 VENOUS: 38 mmHg (ref 30–55)
PO2 VENOUS: 42 mmHg (ref 30–55)
POTASSIUM WHOLE BLOOD: 2.8 mmol/L — ABNORMAL LOW (ref 3.4–4.6)
POTASSIUM WHOLE BLOOD: 2.9 mmol/L — ABNORMAL LOW (ref 3.4–4.6)
SODIUM WHOLE BLOOD: 132 mmol/L — ABNORMAL LOW (ref 135–145)
SODIUM WHOLE BLOOD: 133 mmol/L — ABNORMAL LOW (ref 135–145)
SODIUM WHOLE BLOOD: 128 mmol/L — ABNORMAL LOW (ref 135–145)

## 2019-12-11 LAB — PHOSPHORUS: Phosphate:MCnc:Pt:Ser/Plas:Qn:: 3.1

## 2019-12-11 LAB — COMPREHENSIVE METABOLIC PANEL
ALBUMIN: 1.8 g/dL — ABNORMAL LOW (ref 3.5–5.0)
ALKALINE PHOSPHATASE: 1205 U/L — ABNORMAL HIGH (ref 65–260)
ALT (SGPT): 240 U/L — ABNORMAL HIGH (ref <50)
ANION GAP: 8 mmol/L (ref 7–15)
AST (SGOT): 423 U/L — ABNORMAL HIGH (ref 19–55)
BILIRUBIN TOTAL: 22.3 mg/dL — ABNORMAL HIGH (ref 0.0–1.2)
BLOOD UREA NITROGEN: 18 mg/dL (ref 7–21)
BUN / CREAT RATIO: 9
CALCIUM: 7.6 mg/dL — ABNORMAL LOW (ref 8.5–10.2)
CHLORIDE: 108 mmol/L — ABNORMAL HIGH (ref 98–107)
CREATININE: 1.92 mg/dL — ABNORMAL HIGH (ref 0.70–1.30)
EGFR CKD-EPI AA MALE: 57 mL/min/{1.73_m2} — ABNORMAL LOW (ref >=60)
EGFR CKD-EPI NON-AA MALE: 49 mL/min/{1.73_m2} — ABNORMAL LOW (ref >=60)
GLUCOSE RANDOM: 112 mg/dL (ref 70–179)
POTASSIUM: 3 mmol/L — ABNORMAL LOW (ref 3.5–5.0)
PROTEIN TOTAL: 4.1 g/dL — ABNORMAL LOW (ref 6.5–8.3)
SODIUM: 130 mmol/L — ABNORMAL LOW (ref 135–145)

## 2019-12-11 LAB — CALCIUM IONIZED VENOUS (MG/DL)
Calcium.ionized:MCnc:Pt:Bld:Qn:: 4.52
Calcium.ionized:MCnc:Pt:Bld:Qn:: 4.62

## 2019-12-11 LAB — SLIDE REVIEW

## 2019-12-11 LAB — CO2: Carbon dioxide:SCnc:Pt:Ser/Plas:Qn:: 14 — ABNORMAL LOW

## 2019-12-11 LAB — PCO2 VENOUS: Lab: 29 — ABNORMAL LOW

## 2019-12-11 LAB — MEAN CORPUSCULAR HEMOGLOBIN: Erythrocyte mean corpuscular hemoglobin:EntMass:Pt:RBC:Qn:Automated count: 27.9

## 2019-12-11 LAB — AMMONIA: Ammonia:SCnc:Pt:Plas:Qn:: <9 — ABNORMAL LOW

## 2019-12-11 LAB — GAMMA GT: GAMMA GLUTAMYL TRANSFERASE: 256 U/L — ABNORMAL HIGH (ref 12–109)

## 2019-12-11 LAB — TARGET CELLS

## 2019-12-11 LAB — LARGE UNSTAINED CELLS: Lab: 1

## 2019-12-11 LAB — MAGNESIUM: Magnesium:MCnc:Pt:Ser/Plas:Qn:: 1.9

## 2019-12-11 LAB — GAMMA GLUTAMYL TRANSFERASE: Gamma glutamyl transferase:CCnc:Pt:Ser/Plas:Qn:: 256 — ABNORMAL HIGH

## 2019-12-11 LAB — PAPPENHEIMER BODIES

## 2019-12-11 LAB — LACTATE BLOOD VENOUS: Lactate:SCnc:Pt:BldV:Qn:: 1.7

## 2019-12-11 LAB — VANCOMYCIN TROUGH: Vancomycin^trough:MCnc:Pt:Ser/Plas:Qn:: 14.2

## 2019-12-11 LAB — C-REACTIVE PROTEIN: C reactive protein:MCnc:Pt:Ser/Plas:Qn:: 34.7 — ABNORMAL HIGH

## 2019-12-11 NOTE — Unmapped (Signed)
Patient remains stable in PICU. PERRLA. 3mm. Yellow sclera. Jaundiced skin. A&O X4. Room air. Clear. No desats. Afebrile . HR 80s-100s. Systolics 70s-80s. MD notified of soft pressures. Cool distally. 20G right AC. +2 distal pulses. NPO. No UOP. No stool. Passing gas. Moderate amount of coffee ground emesis. Multiple occasions where patient felt like he was going to throw up. Rash noted on chest and upper arms. Mother at bedside. Both updated on POC and all questions answered. WCTM     Problem: Adult Inpatient Plan of Care  Goal: Plan of Care Review  Outcome: Ongoing - Unchanged  Goal: Patient-Specific Goal (Individualization)  Outcome: Ongoing - Unchanged  Goal: Absence of Hospital-Acquired Illness or Injury  Outcome: Ongoing - Unchanged  Goal: Optimal Comfort and Wellbeing  Outcome: Ongoing - Unchanged  Goal: Readiness for Transition of Care  Outcome: Ongoing - Unchanged  Goal: Rounds/Family Conference  Outcome: Ongoing - Unchanged

## 2019-12-11 NOTE — Unmapped (Signed)
PICU H&P Note    Assessment:   [name] Gross is a 19 y.o. 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 hepatorenal syndrome (HRS) with possible GI bleed from known esophageal varices. Pt afebrile without abd tenderness but WBC 15, thus spontaneous bacterial peritonitis is also on the differential. Ammonia level wnl and mentating appropriately thus less concern for hepatic encephalopathy, but will monitor closely for AMS. Pediatric GI was consulted upon admission. Plan to obtain liver transplant ultrasound, labs to evaluate for HRS, IVFs for resuscitation, IV protonix for possible bleed, and broad spectrum antibiotics pending culture results. Per GI, will hold nephrotoxic medications for overnight given AKI, and discuss restarting his immunosuppressive agents with Dr. Gean Quint (primary Peds GI) in the AM.      Patient Active Problem List   Diagnosis   ??? Adjustment disorder with anxious mood   ??? Recurrent major depressive disorder, in partial remission (CMS-HCC)   ??? Malnutrition of mild degree (CMS-HCC)   ??? History of liver transplant (CMS-HCC)   ??? Allergic rhinitis   ??? Transaminitis   ??? History of suicide attempt   ??? 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 Acute rejection of liver transplant (CMS-HCC)   ??? Ingestion of multiple medications   ??? Liver transplant rejection (CMS-HCC)   ??? Normocytic anemia   ??? AKI (acute kidney injury) (CMS-HCC)   ??? Electrolyte disturbance   ??? Hypoalbuminemia   ??? H/O liver transplant (CMS-HCC)   ??? 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 High serum gamma glutamyl transferase (GGT)   ??? Steroid-induced hyperglycemia   ??? Pityrosporum folliculitis   ??? Bilateral knee swelling   ??? Homeless   ??? Thrombocytopenia (CMS-HCC)   ??? Hypomagnesemia   ??? Hypophosphatasia   ??? Other ascites   ??? Hyperbilirubinemia   ??? Impetigo lesions of Right Ear and Posterior Neck   ??? Steroid-induced acne   ??? 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 Irritant contact dermatitis of the hands   ??? Sleep difficulties   ??? Fever       Plan:   Resp: hx of asthma  - SORA  - Home flovent 1 puff BID  - Albuterol PRN  - Obtain CXR    CV: hypotension on admission likely 2/2 to dehydration   - CRM  - consider albumin for volume expansion given hx of volume overload and pleural effusions    Heme: Repeat PVL on 11/13 demonstrate stable DVTs in RUE axial and brachial veins, not on therapeutic anticoagulation  - Type and screen  - obtain consent for blood  - CBCd q12h  - SCDs for ppx  - hemoccult emesis    ID: s/p CTX and Flagyl at OSH on 1/30  - Begin Cefepime and Vancomycin  - consult pharmacy for Vanc dosing  - f/u [facility] cultures  - holding home Bactrim ppx (usually M-W-F) due to AKI  - home valganciclovir daily  Neuro:  - neuro checks q4h    FEN/GI: AKI on admission with Cr 2.7, likely prerenal in the setting of dehydration vs. 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 Nonnie Done). Last paracentesis performed on Nov 2020.   - consult peds GI   - consider consult adult hepatology  - hold home mycophenolate and tacrolimus given nephrotoxicity per Peds GI  - home spironolactone 50mg  daily  - home rifaximin 550mg  BID  - home prednisone 15mg  daily  - obtain UA and UNa  - HFP daily  - obtain US liver transplant  - consider c/s Med M for diagnostic paracentesis to eval for SBP in AM    ENDO: hx of steroid-induced hyperglycemia, previously on insulin but not currently. Euglycemic on admission.   - consider stress dose-steroids     DERM: steroid-induced acne and atypical dermatitis  - hold home topical clindamycin and benzoyl peroxide    Psych: Seen previously by Dr. Jessee Avers, set up with [facility]  - consult children's supportive care    Social: Pt reports he lives with his mother and he would like for her to get updates on his medical care.    Subjective:   Primary Care Provider: [name], MD    History provided by: mother and patient    An interpreter was not used during the visit.     I have personally reviewed outside records.     Subjective:    HPI: [name] Gross is a 19 y.o. 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    Pt reports 2 weeks ago he developed NBNB emesis, occurring every time he tried to eat or drink. Over the past week the emesis has turned to coffee brown in color. Denies abdominal pain. He's also having darker colored stools, dark brown but also dark green. No black or bright red stools. Has had decreased UOP - Pt reports no urine in the past 24 hours. Has had a cough. No fevers. Reports his belly is getting bigger, but no swelling in his extremities. No exposures to sick contacts or COVID. Reports taking all his medications as prescribed.    At [facility], he was persistently vomiting, not eating or drinking and not able to keep medications down. Pt had initial blood pressures of 60s/40s, HR 90s. Generally weak appearing with coffee ground emesis.   OSH labs are as follows:   WBC 13.4, Hgb 9, HCT 24, Plt 151   Na 132, K 2.9, Cl 102, BUN 26, Cr 2.7, Gluc 136   AST 419, ALT 266, Alk phos 1,670, Tbili 35.3   Ammonia 56   PT 18.7, INR 1.6, aPTT 49   Lactic acid 2.0   Urinalysis negative  for protein, moderate bilirubin   CXR unremarkable  Was given 30cc/kg LR boluses (1750 ml) with a dose of Ceftriaxone (2100) and Flagyl (2000) and reglan (1900) at OSH prior to transfer. He received another 1L LR bolus with EMS, for a total fluid resuscitation about 60cc/kg.      PAST MEDICAL HISTORY:  Past Medical History:   Diagnosis Date   ??? 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 Depression    ??? MSSA bacteremia 08/27/2019   ??? Seasonal allergies    ??? Sickle cell trait (CMS-HCC)    ??? Strep Mitis Central line-associated bloodstream infection 08/27/2019       PAST SURGICAL HISTORY:  Past Surgical History:   Procedure Laterality Date   ??? FRENULECTOMY, LINGUAL     ??? PR ERCP REMOVE FOREIGN BODY/STENT BILIARY/PANC DUCT N/A 08/20/2017    Procedure: ENDOSCOPIC RETROGRADE CHOLANGIOPANCREATOGRAPHY (ERCP); W/ REMOVAL OF FOREIGN BODY/STENT FROM BILIARY/PANCREATIC DUCT(S);  Surgeon: Vonda Antigua, MD;  Location: GI PROCEDURES MEMORIAL [facility];  Service: Gastroenterology   ??? PR ERCP REMOVE FOREIGN BODY/STENT BILIARY/PANC DUCT N/A 06/04/2018    Procedure: ENDOSCOPIC RETROGRADE CHOLANGIOPANCREATOGRAPHY (ERCP); W/ REMOVAL OF FOREIGN BODY/STENT FROM BILIARY/PANCREATIC DUCT(S);  Surgeon: Chriss Driver, MD;  Location: GI PROCEDURES MEMORIAL [facility];  Service: Gastroenterology   ??? PR ERCP STENT PLACEMENT BILIARY/PANCREATIC DUCT N/A 11/21/2016    Procedure: ENDOSCOPIC RETROGRADE CHOLANGIOPANCREATOGRAPHY (ERCP); WITH PLACEMENT OF ENDOSCOPIC STENT INTO BILIARY OR PANCREATIC DUCT;  Surgeon: Vonda Antigua, MD;  Location: GI PROCEDURES MEMORIAL [facility];  Service: Gastroenterology   ??? PR ERCP,W/REMOVAL STONE,BIL/PANCR DUCTS N/A 08/20/2017    Procedure: ERCP; W/ENDOSCOPIC RETROGRADE REMOVAL OF CALCULUS/CALCULI FROM BILIARY &/OR PANCREATIC DUCTS;  Surgeon: Vonda Antigua, MD;  Location: GI PROCEDURES MEMORIAL [facility];  Service: Gastroenterology   ??? PR ERCP,W/REMOVAL STONE,BIL/PANCR DUCTS N/A 06/04/2018    Procedure: ERCP; W/ENDOSCOPIC RETROGRADE REMOVAL OF CALCULUS/CALCULI FROM BILIARY &/OR PANCREATIC DUCTS;  Surgeon: Chriss Driver, MD;  Location: GI PROCEDURES MEMORIAL [facility];  Service: Gastroenterology   ??? PR TRANSPLANT LIVER,ALLOTRANSPLANT N/A 09/22/2016    Procedure: LIVER ALLOTRANSPLANTATION; ORTHOTOPIC, PARTIAL OR WHOLE, FROM CADAVER OR LIVING DONOR, ANY AGE;  Surgeon: Doyce Loose, MD;  Location: MAIN OR [facility];  Service: Transplant   ??? PR TRANSPLANT,PREP DONOR LIVER, WHOLE N/A 09/22/2016    Procedure: [facility] STD PREP CAD DONOR WHOLE LIVER GFT PRIOR TNSPLNT,INC CHOLE,DISS/REM SURR TISSU WO TRISEG/LOBE SPLT;  Surgeon: Doyce Loose, MD;  Location: MAIN OR [facility];  Service: Transplant   ??? PR UPGI ENDOSCOPY W/US FN BX N/A 08/20/2017    Procedure: UGI W/ TRANSENDOSCOPIC ULTRASOUND GUIDED INTRAMURAL/TRANSMURAL FINE NEEDLE ASPIRATION/BIOPSY(S), ESOPHAGUS;  Surgeon: Vonda Antigua, MD;  Location: GI PROCEDURES [facility];  Service: Gastroenterology   ??? PR UPPER GI ENDOSCOPY,BIOPSY N/A 07/15/2016    Procedure: UGI ENDOSCOPY; WITH BIOPSY, SINGLE OR MULTIPLE;  Surgeon: Arnold Long Mir, MD;  Location: PEDS PROCEDURE ROOM [facility];  Service: Gastroenterology   ??? PR UPPER GI ENDOSCOPY,CTRL BLEED Left 05/05/2016    Procedure: UGI ENDOSCOPY; WITH CONTROL OF BLEEDING, ANY METHOD;  Surgeon: Arnold Long Mir, MD;  Location: PEDS PROCEDURE ROOM [facility];  Service: Gastroenterology   ??? PR UPPER GI ENDOSCOPY,CTRL BLEED N/A 05/12/2016    Procedure: UGI ENDOSCOPY; WITH CONTROL OF BLEEDING, ANY METHOD;  Surgeon: Annie Paras, MD;  Location: CHILDRENS OR [facility];  Service: Gastroenterology   ???  PR UPPER GI ENDOSCOPY,DIAGNOSIS N/A 09/21/2019    Procedure: UGI ENDO, INCLUDE ESOPHAGUS, STOMACH, & DUODENUM &/OR JEJUNUM; DX W/WO COLLECTION SPECIMN, BY BRUSH OR WASH;  Surgeon: Monte Fantasia, MD;  Location: GI PROCEDURES [facility]ORIAL [facility];  Service: Gastroenterology       FAMILY HISTORY:  Family History   Problem Relation Age of Onset   ??? Diabetes Maternal Grandmother    ??? Diabetes Paternal Grandmother    ??? Hypertension Maternal Grandfather    ??? Hypertension Paternal Grandfather    ??? Asthma Brother    ??? Anesthesia problems Neg Hx    ??? Melanoma Neg Hx    ??? Basal cell carcinoma Neg Hx    ??? Squamous cell carcinoma Neg Hx        SOCIAL HISTORY:   reports that he has never smoked. He has never used smokeless tobacco. He reports that he does not drink alcohol or use drugs.    ALLERGIES:  Bee pollen and Pollen extracts     MEDICATIONS:  Prior to Admission medications    Medication Sig Start Date End Date Taking? Authorizing Provider   albuterol HFA 90 mcg/actuation inhaler Inhale 2 puffs every four (4) hours as needed (intractable coughing). 11/30/19 11/29/20  Glorianne Manchester, MD   amoxicillin-clavulanate (AUGMENTIN) 875-125 mg per tablet Take 1 tablet by mouth Two (2) times a day. 11/30/19   Loni Beckwith, MD   cholecalciferol, vitamin D3, 125 mcg (5,000 unit) tablet Take 1 tablet (5,000 Units total) by mouth daily. 09/07/19 09/06/20  Alfredia Ferguson, MD   clindamycin-benzoyl peroxide 1.2 %(1 % base) -5 % gel Apply to rash on chest  Patient taking differently: Apply 1 application topically nightly. Apply to rash on chest 09/08/19   Alfredia Ferguson, MD   diclofenac sodium (VOLTAREN) 1 % gel Apply 2 g topically two (2) times a day. 11/14/19 11/13/20  Loni Beckwith, MD   fluticasone propionate (FLOVENT HFA) 220 mcg/actuation inhaler 1 puff into mouth and swallow, twice a day.  Do not eat or drink for 30 minutes after taking. 11/30/19   Glorianne Manchester, MD   magnesium oxide (MAGOX) 400 mg (241.3 mg magnesium) tablet Take 1 tablet (400 mg total) by mouth Two (2) times a day. 10/21/19 10/20/20  Janyth Pupa, MD   multivitamin, with zinc (AQUADEKS) 100-5 mcg-mg [place] 2 tablets daily. 11/08/19   Glorianne Manchester, MD   mycophenolate (MYFORTIC) 180 MG EC tablet Take 3 tablets (540 mg total) by mouth Two (2) times a day. 11/08/19 11/07/20  Glorianne Manchester, MD   ondansetron (ZOFRAN-ODT) 4 MG disintegrating tablet Take 1 tablet (4 mg total) by mouth every eight (8) hours as needed for up to 7 days. 09/07/19   Alfredia Ferguson, MD   pantoprazole (PROTONIX) 40 MG tablet Take 1 tablet (40 mg total) by mouth daily. 10/10/19 11/10/19  Glorianne Manchester, MD   predniSONE (DELTASONE) 5 MG tablet Take 3 tablets (15 mg total) by mouth daily. 10/18/19   Glorianne Manchester, MD   rifAXIMin [place]) 550 mg Tab Take 1 tablet (550 mg total) by mouth Two (2) times a day. 11/08/19   Glorianne Manchester, MD   spironolactone (ALDACTONE) 50 MG tablet Take 1 tablet (50 mg total) by mouth daily. 10/18/19 10/17/20  Glorianne Manchester, MD   sulfamethoxazole-trimethoprim (BACTRIM) 400-80 mg per tablet Take 1 tablet (80 mg of trimethoprim total) by mouth Every Monday, Wednesday, and Friday. 11/09/19  01/04/20  Glorianne Manchester, MD   tacrolimus (PROGRAF) 1 MG capsule Take 1 capsule (1 mg total) by mouth two (2) times a day. 11/08/19 12/28/19  Glorianne Manchester, MD   valGANciclovir (VALCYTE) 450 mg tablet Take 1 tablet (450 mg total) by mouth daily. 11/08/19 01/08/20  Glorianne Manchester, MD   mirtazapine (REMERON) 15 MG tablet Take 1 tablet (15 mg total) by mouth nightly. 08/25/17 01/07/18  Dawn Nuala Alpha, RN       IMMUNIZATIONS:  Immunization History   Administered Date(s) Administered   ??? DTaP 06/12/2000, 10/13/2000, 01/06/2001, 08/16/2001, 06/24/2004   ??? HPV Quadrivalent (Gardasil) 04/17/2011, 05/27/2012, 05/11/2013   ??? Hepatitis A 09/11/2006, 01/23/2009   ??? Hepatitis B vaccine, pediatric/adolescent dosage, 05-31-2000, 04/24/2000, 01/06/2001   ??? HiB-PRP-OMP 06/12/2000, 10/13/2000, 01/06/2001, 05/18/2001   ??? Influenza LAIV (Nasal-Quad) 2-49yrs 09/14/2014   ??? Influenza Vaccine Quad (IIV4 PF) 29mo+ injectable 10/12/2017, 09/10/2019   ??? MMR 05/18/2001, 06/24/2004   ??? Meningococcal Conjugate MCV4P 04/17/2011, 05/22/2016   ??? PPD Test 05/17/2016   ??? Pneumococcal Conjugate 13-Valent 06/12/2000, 10/13/2000, 01/06/2001   ??? Poliovirus,inactivated (IPV) 06/12/2000, 10/13/2000, 05/18/2001, 06/24/2004   ??? TdaP 04/17/2011   ??? Varicella 05/18/2001, 07/24/2005, 09/11/2006          ROS:  The remainder of 10 systems reviewed were negative except as mentioned in the HPI.           Objective:           PE:   Vital signs:  Temp:  [36.6 ??C] 36.6 ??C  Heart Rate:  [83-97] 83  SpO2 Pulse:  [84-93] 84  Resp:  [16-19] 19  BP: (74-92)/(38-54) 81/40  MAP (mmHg):  [50-67] 52  SpO2:  [100 %] 100 %  BMI (Calculated):  [18.26] 18.26  Vitals:    12/11/19 0200   Weight: 52.9 kg (116 lb 10 oz)   , 2 %ile (Z= -2.03) based on CDC (Boys, 2-20 Years) weight-for-age data using vitals from 12/11/2019.  Ht Readings from Last 1 Encounters:   12/11/19 170.2 cm (5' 7.01) (18 %, Z= -0.92)*     * Growth percentiles are based on CDC (Boys, 2-20 Years) data.   , 18 %ile (Z= -0.92) based on CDC (Boys, 2-20 Years) Stature-for-age data based on Stature recorded on 12/11/2019.  HC Readings from Last 1 Encounters:   No data found for [facility]     Body mass index is 18.26 kg/m??., 2 %ile (Z= -2.09) based on CDC (Boys, 2-20 Years) BMI-for-age based on BMI available as of 12/11/2019.    Physical Exam:  General:  ill-appearing, frail 18y/o male lying in bed, intermittently coughing and gagging but no active vomiting, not diaphoretic  Head: [place]/AT, eyes are exopthalmus  Eyes:   Scleral icterus  Nose:   Nose piercing in left nostril, nares clear  Oropharynx:  Tacky mucous membranes  Neck:  supple  Lungs:  Clear to ascultation bilaterally, no wheezes or crackles, no tachypnea, normal WOB on room air  Heart:   Normal S1 and S2, no murmurs, radial pulses palpable  Abdomen: surgical scars on abdomen present. Abdomen is round and full, but soft. Reports nontender but starts coughing/gagging with palpation. Unable to assess for fluid wave or organomegaly.  Neuro:  Tired-appearing with generalized weakness and tremulousness. Hand tremor present. Answers questions in short phrases, cooperates with exam. No focal deficits.  Extremities:  Thin, muscle wasting  Skin:  Full body jaundice, skin warm and dry, no pitting edema  Studies: Personally reviewed and interpreted.  I have reviewed the labs and studies from the last 24 hours from OSH via care everywhere.  Lab results are pending.    Imaging: None      Continuous Infusions:     Hemodynamic/Invasive Device Data (24 hrs):       Ventilation/Oxygen Therapy (24hrs):       Data Review:   I have reviewed the labs and studies from the last 24 hours.    Tubes and Drains:            Vernard Gambles, MD   Pediatrics, PGY-2  Pager Number [phone]

## 2019-12-11 NOTE — Unmapped (Signed)
PEDIATRIC GASTROENTEROLOGY INPATIENT CONSULTATION NOTE    Requesting Attending Physician :  Andi Devon, MD    Reason for Consult:    [name] Gross is a 19 y.o. male seen in consultation at the request of Dr. 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  He also has complications of end-stage liver disease including ascites and grade 1 esophageal varices.    Pediatric GI consult is obtained for further evaluation of his acute presentation with acute kidney injury, emesis, abdominal distention and coffee ground emesis.    He has been experiencing emesis for the past 2 weeks, with reports of coffee-ground emesis and a declining hemoglobin trend 10.2--> 7.7 noted on admission.    Clinically, normal mental status (except some drowsiness), his abdomen is distended with fluid thrill+, he has bilateral clubbing of his finger nails but with normal O2 sats.  He has intermittent low mean arterial pressures, low systolic blood pressures. Tremors+ He has been afebrile throughout.    His creatinine was elevated at 2.7 at [facility] and upon admission, he remained anuric overnight.  His creatinine is however showing an improving trend with IV albumin and fluids.    Labs show a very low albumin- 1.8(likely secondary to an acute inflammatory response/sick glycocalyx/severe chronic malnutrition), but with normal/preserved PT/INR-15.2/1.2.  He has a mixed pattern of liver injury, with prominent obstructive/biliary type picture-elevated GGT, markedly elevated alkaline phosphatase and markedly elevated direct bilirubin.  His last liver biopsy in October 2020 showed ductopenia suggestive of chronic rejection and severe hepatocanalicular cholestasis.  He has perivenular, periportal and portal fibrosis.    His ultrasound abdomen on this admission shows increasing resistive indicis of his hepatic artery, and large volume abdominopelvic ascites, suggestive of portal hypertension.  Ascites fluid tap was performed today.    At this point, he likely has an acute ongoing process that may have tipped him over, causing worsening creatinine, ascites and coffee-ground emesis.  Differentials include spontaneous bacterial peritonitis, variceal bleeding, prerenal cause (intravascular volume depletion from recurrent emesis), hepatorenal syndrome.    The short-term goal of management is achieving hemodynamic stability.  Following recommendations are made after discussion with primary GI team, Dr. Nonnie Done and Dr. Woodfin Ganja, transplant hepatologist on call.  Dr. Piedad Climes will be looking into his transplant candidacy.    RECS    1.  His improving creatinine following IV albumin doses and fluids is suggestive of likely a prerenal cause for AKI.  Given his soft systolic blood pressures and low Mean Arterial Pressures (MAP), agree with current management:  A.  Scheduled albumin infusions,  B. Starting norepinephrine infusion,  C. Goal is to maintain a Mean Arterial Pressure of at least 60 mmHg    2.  Recommend obtaining a nephrology consult  -Please ensure a strict urine output, I/O  -Suggest performing a urine spin/sediment testing to look for casts in his urine (Acute Tubular Necrosis)  -Could consider discontinuing spironolactone for concern over diuretic induced azotemia    3.  Optimize his nutritional status  -He is currently in a hypermetabolic state, in addition to already being malnourished and frail (albumin:1.8, prealbumin(low): 8.7)  A.  Recommend inserting a weighted CorPak (NG/ND) for supplemental nutrition to improve protein caloric intake  B.  At high risk of fat-soluble vitamin deficiency due  to his cholestasis- Recommend obtaining fat-soluble vitamins testing and repleting these vitamins accordingly   -25-hydroxy vitamin D, vitamin E (to be drawn along with cholesterol+ LDL), vitamin A,   PIVKA (Des gamma PT) for vitamin K deficiency  -Alkaline phosphatase isoenzymes (to differentiate bone from liver cause of elevation)  C.  His nutritional status greatly affects his outcome/prognosis if he were to have another liver transplant.  Recommend consulting transplant nutrition.    4.  Agree with paracentesis, blood culture and IV antibiotics.  -Although nucleated cells are just 56 in number, ascitic fluid culture needs to be sent, esp. in the context of his immunosuppression when he may not mount an adequate immune response.    5.  Agree with PRBC transfusion, given possible ongoing loss (coffee-ground emesis) and drop in hematocrit from 33 to 23%.  Can consider EGD in coordination with adult hepatology if continues to have hematemesis and PRBC transfusion requirement.      6.  Agree with holding nephrotoxic agents (e.g. tacrolimus and mycophenolate mofetil) until renal parameters have improved.    History of Present Illness:   This is a 19 y.o. year old male with a history of??cryptogenic cirrhosis??s/p liver transplant in 2017 with non-compliance and non-adherence to anti-rejection medication with multiple episodes of??acute on chronic rejection leading to graft loss, with course complicated by MSSA and strep mitis bacteremia, right upper extremity DVT, G1 esophageal varices, ascites and limb swelling, and diarrhea, who presents as a transfer from [facility] where he initially presented with coffee ground emesis and decreased PO intake x2 weeks.    Review of Systems:  The balance of 12 systems reviewed is negative except as noted in the HPI.     Past Medical History:  Past Medical History:   Diagnosis Date   ??? 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 Depression    ??? MSSA bacteremia 08/27/2019   ??? Seasonal allergies    ??? Sickle cell trait (CMS-HCC)    ??? Strep Mitis Central line-associated bloodstream infection 08/27/2019       Surgical History:  Past Surgical History:   Procedure Laterality Date   ??? FRENULECTOMY, LINGUAL     ??? PR ERCP REMOVE FOREIGN BODY/STENT BILIARY/PANC DUCT N/A 08/20/2017    Procedure: ENDOSCOPIC RETROGRADE CHOLANGIOPANCREATOGRAPHY (ERCP); W/ REMOVAL OF FOREIGN BODY/STENT FROM BILIARY/PANCREATIC DUCT(S);  Surgeon: Vonda Antigua, MD;  Location: GI PROCEDURES MEMORIAL [facility];  Service: Gastroenterology   ??? PR ERCP REMOVE FOREIGN BODY/STENT BILIARY/PANC DUCT N/A 06/04/2018    Procedure: ENDOSCOPIC RETROGRADE CHOLANGIOPANCREATOGRAPHY (ERCP); W/ REMOVAL OF FOREIGN BODY/STENT FROM BILIARY/PANCREATIC DUCT(S);  Surgeon: Chriss Driver, MD;  Location: GI PROCEDURES MEMORIAL [facility];  Service: Gastroenterology   ??? PR ERCP STENT PLACEMENT BILIARY/PANCREATIC DUCT N/A 11/21/2016    Procedure: ENDOSCOPIC RETROGRADE CHOLANGIOPANCREATOGRAPHY (ERCP); WITH PLACEMENT OF ENDOSCOPIC STENT INTO BILIARY OR PANCREATIC DUCT;  Surgeon: Vonda Antigua, MD;  Location: GI PROCEDURES MEMORIAL [facility];  Service: Gastroenterology   ??? PR ERCP,W/REMOVAL STONE,BIL/PANCR DUCTS N/A 08/20/2017    Procedure: ERCP; W/ENDOSCOPIC RETROGRADE REMOVAL OF CALCULUS/CALCULI FROM BILIARY &/OR PANCREATIC DUCTS;  Surgeon: Vonda Antigua, MD;  Location: GI PROCEDURES MEMORIAL [facility];  Service: Gastroenterology   ??? PR ERCP,W/REMOVAL STONE,BIL/PANCR DUCTS N/A 06/04/2018    Procedure: ERCP; W/ENDOSCOPIC RETROGRADE REMOVAL OF CALCULUS/CALCULI FROM BILIARY &/OR PANCREATIC DUCTS;  Surgeon: Chriss Driver, MD;  Location: GI PROCEDURES MEMORIAL [facility];  Service: Gastroenterology   ??? PR TRANSPLANT LIVER,ALLOTRANSPLANT N/A 09/22/2016    Procedure: LIVER ALLOTRANSPLANTATION; ORTHOTOPIC, PARTIAL OR WHOLE, FROM CADAVER OR LIVING DONOR, ANY AGE;  Surgeon: Doyce Loose, MD;  Location: MAIN OR [facility];  Service: Transplant   ??? PR TRANSPLANT,PREP DONOR LIVER, WHOLE N/A 09/22/2016    Procedure: [facility] STD PREP CAD DONOR WHOLE LIVER GFT PRIOR TNSPLNT,INC CHOLE,DISS/REM SURR TISSU WO TRISEG/LOBE SPLT;  Surgeon: Doyce Loose, MD;  Location: MAIN OR [facility];  Service: Transplant   ??? PR UPGI ENDOSCOPY W/US FN BX N/A 08/20/2017    Procedure: UGI W/ TRANSENDOSCOPIC ULTRASOUND GUIDED INTRAMURAL/TRANSMURAL FINE NEEDLE ASPIRATION/BIOPSY(S), ESOPHAGUS;  Surgeon: Vonda Antigua, MD;  Location: GI PROCEDURES [facility]ORIAL [facility];  Service: Gastroenterology   ??? PR UPPER GI ENDOSCOPY,BIOPSY N/A 07/15/2016    Procedure: UGI ENDOSCOPY; WITH BIOPSY, SINGLE OR MULTIPLE;  Surgeon: Arnold Long Mir, MD;  Location: PEDS PROCEDURE ROOM [facility];  Service: Gastroenterology   ??? PR UPPER GI ENDOSCOPY,CTRL BLEED Left 05/05/2016    Procedure: UGI ENDOSCOPY; WITH CONTROL OF BLEEDING, ANY METHOD;  Surgeon: Arnold Long Mir, MD;  Location: PEDS PROCEDURE ROOM [facility];  Service: Gastroenterology   ??? PR UPPER GI ENDOSCOPY,CTRL BLEED N/A 05/12/2016    Procedure: UGI ENDOSCOPY; WITH CONTROL OF BLEEDING, ANY METHOD;  Surgeon: Annie Paras, MD;  Location: CHILDRENS OR [facility];  Service: Gastroenterology   ??? PR UPPER GI ENDOSCOPY,DIAGNOSIS N/A 09/21/2019    Procedure: UGI ENDO, INCLUDE ESOPHAGUS, STOMACH, & DUODENUM &/OR JEJUNUM; DX W/WO COLLECTION SPECIMN, BY BRUSH OR WASH;  Surgeon: Monte Fantasia, MD;  Location: GI PROCEDURES MEMORIAL [facility];  Service: Gastroenterology       Family History:  The patient's family history includes Asthma in his brother; Diabetes in his maternal grandmother and paternal grandmother; Hypertension in his maternal grandfather and paternal grandfather..    Medications:   Current Facility-Administered Medications   Medication Dose Route Frequency Provider Last Rate Last Admin   ??? albumin human 25 % bottle 25 g  25 g Intravenous Q6H Sheran Fava Sites, MD   25 g at 12/11/19 9604   ??? [START ON 12/12/2019] cefepime dilution (MAXIPIME) 40 mg/mL injection 1 g  1 g Intravenous Q24H Elesa Hacker, MD       ??? cholecalciferol (vitamin D3) tablet 5,000 Units  5,000 Units Oral Daily Alecia Lemming, MD   5,000 Units at 12/11/19 0933   ??? fluticasone propionate (FLOVENT HFA) 220 mcg/actuation inhaler 1 puff  1 puff Inhalation Q12H (RT) Alecia Lemming, MD   1 puff at 12/11/19 0900   ??? lactated Ringers infusion  95 mL/hr Intravenous Continuous Alecia Lemming, MD 95 mL/hr at 12/11/19 1100 95 mL/hr at 12/11/19 1100   ??? magnesium oxide (MAG-OX) tablet 400 mg  400 mg Oral BID Alecia Lemming, MD   400 mg at 12/11/19 0939   ??? metroNIDAZOLE (FLAGYL) IVPB 500 mg  500 mg Intravenous [facility] Elesa Hacker, MD 100 mL/hr at 12/11/19 0938 500 mg at 12/11/19 5409   ??? multivitamin, with zinc (AQUADEKS) chewable tablet  2 tablet Oral Daily Alecia Lemming, MD   2 tablet at 12/11/19 0940   ??? ondansetron (ZOFRAN) injection 4 mg  4 mg Intravenous Q8H PRN Alecia Lemming, MD   4 mg at 12/11/19 8119   ??? pantoprazole (PROTONIX) injection 40 mg  40 mg Intravenous BID Alecia Lemming, MD   40 mg at 12/11/19 0600   ???  predniSONE (DELTASONE) tablet 15 mg  15 mg Oral Daily Alecia Lemming, MD   15 mg at 12/11/19 0941   ??? rifAXIMin (XIFAXAN) tablet 550 mg  550 mg Oral BID Alecia Lemming, MD   550 mg at 12/11/19 1610   ??? spironolactone (ALDACTONE) tablet 50 mg  50 mg Oral Daily Alecia Lemming, MD   50 mg at 12/11/19 0943   ??? [START ON 12/13/2019] valGANciclovir (VALCYTE) tablet 450 mg  450 mg Oral Q48H Elesa Hacker, MD           Allergies:  Bee pollen and Pollen extracts    Social History:  Social History     Social History Narrative    Lives part time with mother and part time with [employer].         Objective:     Vital Signs:  Temp:  [36.5 ??C-37 ??C] 37 ??C  Heart Rate:  [82-119] 84  SpO2 Pulse:  [82-116] 116  Resp:  [13-26] 18  BP: (74-92)/(32-70) 84/51  MAP (mmHg):  [47-78] 52  SpO2:  [100 %] 100 %  BMI (Calculated):  [18.26] 18.26    Physical Exam:  Constitutional: Sleepy but responds appropriately to questions, appears under nourished.  Periorbital edema+, bilateral fingernail clubbing   Mental Status:   Sleepy yet pleasantly interactive.   HEENT:   PERRL, icteric, palatal  icterus+, neck supple, no LAD.   Abdomen:  Abdomen appears distended, flanks are full, fluid thrill +     Perianal/Rectal Exam Not performed.     Skin:  Papular skin rash noted over trunk, that responds well to clindamycin, per [place]      Neuro: No focal deficits, appears drowsy         Diagnostic Studies:  I reviewed all pertinent diagnostic studies, including:      Labs:    CBC - Results in Past 2 Days  Result Component Current Result   WBC 10.8 (12/11/2019)   RBC 2.75 (L) (12/11/2019)   HGB 7.7 (L) (12/11/2019)   HCT 23.1 (L) (12/11/2019)   Platelet 95 (L) (12/11/2019)     BMP - Results in Past 2 Days  Result Component Current Result   Glucose 112 (12/11/2019)   Sodium Whole Blood 133 (L) (12/11/2019)       Potassium, Bld 2.8 (L) (12/11/2019)       Chloride 108 (H) (12/11/2019)   CO2 14.0 (L) (12/11/2019)   BUN 18 (12/11/2019)   Creatinine 1.92 (H) (12/11/2019)   Calcium 7.6 (L) (12/11/2019)     Coagulation -   No results found for requested labs within last 2 days.     LFT's - Results in Past 2 Days  Result Component Current Result   ALT 240 (H) (12/11/2019)   AST 423 (H) (12/11/2019)   Alkaline Phosphatase 1,205 (H) (12/11/2019)     Amylase -   No results found for requested labs within last 2 days.     Lipase -   No results found for requested labs within last 2 days.     C-Reactive Protein - Results in Past 2 Days  Result Component Current Result   CRP 34.7 (H) (12/11/2019)     Erythrocyte Sedimentation Rate -   No results found for requested labs within last 2 days.     Albumin - Results in Past 2 Days  Result Component Current Result   Albumin 1.8 (L) (12/11/2019)       Imaging:   Radiology studies  were personally reviewed       Luz Lex MD  PGY 5 Pediatric Gastroenterology Fellow      I performed a history and physical examination of the patient and discussed the patient's management with the family and the fellow. I personally reviewed the results of all tests, including blood work and images. I reviewed and edited the fellow's note and agree with the documented findings and plan of care. I provided direct supervision to the care of the patient. I spent over 50% of a total 85 min counseling and/or coordinating the care of this patient.     Rocco Pauls, MD  Pediatric Gastroenterology

## 2019-12-11 NOTE — Unmapped (Addendum)
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 Pt was admitted to the PICU on 12/11/2019.     Please see below for a complete hospital course based on systems:    Resp: Pt with hx of asthma. He was SORA upon admission. Was continued on home flovent 1 puff BID and albuterol PRN. In the setting of his AKI, hypoalbuminemia patient developed bilateral pleural effusions present on CXR. Physical exam revealed coarse breath sounds but no crackles. He did not require any supplemental oxygen throughout admission. Patient's tachypnea improved throughout course of admission and was provided with albumin to assist with removal of excess fluid. Patient was discharged with 10mg  lasix daily.   ??  CV: Pt with hypotension (60/40's) prior to transfer likely 2/2 to dehydration. Received multiple LR boluses, albumin and 1 unit pRBC fluid resuscitation with minimal improvement in BP's upon admission. BP did however improve after stress dose steroids added. Steroids were transitioned back to home regimen on 2/3 after significant hemodynamic improvement. Upon transfer to GI service, patient remained hemodynamically stable and patient was restarted on prednisone 20mg .   ??  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 CBC notable for Hgb 7.7 upon admission (down from Hgb 9 at OSH), thought likely to be dilutional given aggressive fluid resuscitation prior to transfer. Patient HGB stabilized upon transfer to GI service.     ID: Pt afebrile but with WBC of 15 upon admission. Blood cultures obtained at OSH on 1/30 prior to transfer. Upon admission he was started on Flagyl, Cefepime and Vancomycin. He was continued on home valganciclovir but home Bactrim ppx (usually M-W-F) was initially held due to AKI. Peritoneal cultures were obtained due to concern for SBP, which showed no growth of bacteria. Patient was found to have candidal esophagitis on EGD on 2/2 and was started on 14 day course of fluconazole. Patient remained afebrile throughout remainder of admission.   ????  FEN/GI: AKI on admission with Cr 2.7, likely prerenal in the setting of dehydration. Received 60cc/kg IVF resuscitation prior to transfer. He was made NPO and started on mIVFs of LR while in the PICU. Was started on IV Protonix 40mg  BID for concern for coffee ground emesis. Was continued on home aquadeks, mag-ox, vit D3. Patient had NG tube placed for enteral feeds due to inability to have adequate intake in order to meet caloric needs in the setting of his chronic malnutrition. Family meeting was held on 12/19/19 to discuss goals of care moving forward as to whether [name] Gross wanted to move towards comfort care versus preparation for possible second liver transplant. During this meeting, [name] Gross' biggest concern was the removal of his NG tube which would make his ability to remain a candidate for liver transplant very challenging. The benefits and risks of removing his NG tube was discussed with [name] Gross and his family with regards to his chronic malnutrition and need to optimize nutrition if he desires liver transplant. On 2/9, [name] Gross decided to remove his NG tube and attempt to have a regular diet to try and meet caloric needs by mouth. Palliative care was present at this family meeting and will continue following [name] Gross outpatient.     RENAL: Cr initially 2.7, down  trended with volume resuscitation but increased to 2.23 on 2/2. Peds Nephrology was consulted. His AKI was worsened by tacrolimus and his history of multiple previous episodes of kidney injury. Urine microscopy with bile stained muddy brown casts, consistent with ATN. His renal ultrasound was unremarkable. As a result, his medications were renally dosed. His kidney function normalized by time of discharge with creatinine level of 1.20 on 2/11. This creatinine level was similar to creatinine levels in 09/2019 prior to this admission.   ??  Hepatology: Hx of unconfirmed primary sclerosing cholangitis and cryptogenic cirrhosis s/p liver transplant in 2017 with acute on chronic rejection complicated by ascites, followed by Pediatric GI (Dr. Nonnie Done). Last paracentesis performed on Nov 2020. Upon admission, both pediatric GI and adult hepatology were consulted. Liver ultrasound was obtained which did not show any major changes from prior, showed slightly increased resistive indices noted throughout the hepatic arteries (upper limits of normal/minimally elevated), increased diameter common bile duct, and large-volume abdominopelvic ascites. EGD was performed on 2/2, which showed candidial esophagitis and was started on a 14 day course of Fluconazole (2/2-2/14).   ??  ENDO: Pt with hx of steroid-induced hyperglycemia, previously on insulin but not at the time of admission. Upon transfer to the GI service, patient was continued on prednisone 20mg  daily and patient refused POC blood glucose check and hence did not comply with insulin SSI regimen recommended by the adult endocrinology service. BG were not significantly elevated throughout admission.     DERM: Hx of steroid-induced acne and atypical dermatitis. Continued home topical clindamycin and benzoyl peroxide.

## 2019-12-11 NOTE — Unmapped (Signed)
Diagnostic Paracentesis Procedure Note (CPT [phone])    Pre-procedural Planning     Patient Name:: [name] Gross  Patient MRN: [id number]    Indications:  Ascites of unknown etiology    Known Bleeding Diathesis: Patient/caregiver denies any known bleeding or platelet disorder.     Antiplatelet Agents: This patient is not on an antiplatelet agent.    Systemic Anticoagulation: This patient is not on full systemic anticoagulation.    Significant Labs:  INR   Date Value Ref Range Status   11/08/2019 1.20  Final     PT   Date Value Ref Range Status   11/08/2019 15.2 sec Final     APTT   Date Value Ref Range Status   09/30/2019 27.4 25.3 - 37.1 sec Final     Platelet   Date Value Ref Range Status   12/11/2019 95 (L) 150 - 440 10*9/L Final   02/11/2019 197 150 - 450 x10E3/uL Final       Consent: Informed consent was obtained after explanation of the risks (including bleeding, infection, bowel perforation, and leaking) and benefits of the procedure. Refer to the consent documentation.    Procedure Details     Time-out was performed immediately prior to the procedure.    The head of the bed was placed at 45 degrees above level and a large area of ascitic fluid was identified by ultrasound in the right lower quadrant.  The linear probe with color doppler was used to ensure that a vessel was not located in the proposed needle path.    Skin was cleaned with Chlorhexidine. Local anesthesia with 1% lidocaine was introduced subcutaneously then deep to the skin until the parietal peritoneum was anesthetized. A 22 g straight needle was introduced into this site until ascitic fluid was encountered.  Ascitic fluid and the needle were removed with minimal bleeding.  A sterile bandage was placed after holding pressure.     Findings         20 mL of clear and yellow ascites fluid was obtained.    The ascites fluid was sent for Cell count and diff, Culture and Albumin.    Condition     The patient tolerated the procedure well and remains in the same condition as pre-procedure.    Complications     Complications:  None; patient tolerated the procedure well.      Requesting Service: Pediatric ICU (PMS)    Time Requested: 1000  Time Completed: 1200  Comments: None    Resident(s) Performing Procedure: Brita Romp, MD  Resident Year: PGY3     ______________________________________________________  ATTENDING PHYSICIAN    I was present for the entirety of the procedure(s). Loretha Brasil, MD

## 2019-12-11 NOTE — Unmapped (Signed)
Pediatric Vancomycin Therapeutic Monitoring Pharmacy Note    [name] Gross is a 18 y.o. male starting vancomycin. Date of therapy initiation: 12/11/19     Indication: Bacteremia/Sepsis and Suspected infection     Prior Dosing Information: None/new initiation     Dosing Weight: 54 kg    Goals:  Therapeutic Drug Levels  Vancomycin trough goal: 10-20 mg/L    Additional Clinical Monitoring/Outcomes  Renal function, volume status (intake and output)    Results: Not applicable    Wt Readings from Last 1 Encounters:   10/24/19 54 kg (119 lb) (3 %, Z= -1.85)*     * Growth percentiles are based on CDC (Boys, 2-20 Years) data.     Creatinine   Date Value Ref Range Status   11/08/2019 1.78 (H) mg/dL Final   30/86/5784 6.96 0.70 - 1.30 mg/dL Final   29/52/8413 2.44 mg/dL Final      SCr from OSH 2.69     UOP: TBD mL/kg/hr    Pharmacokinetic Considerations and Significant Drug Interactions:  ? Dosed per pediatric guideline  ? Concurrent nephrotoxic meds: cefepime    Assessment/Plan:  Recommendation(s)  ? Start vancomycin 1000 mg (20 mg/kg) IV once    Follow-up  ? Next level ordered: 12 hrs post previous dose  ? A pharmacist will continue to monitor and order levels as appropriate    Please page service pharmacist with questions/clarifications.    Denman George, PharmD

## 2019-12-11 NOTE — Unmapped (Signed)
HEPATOLOGY INPATIENT CONSULTATION H&P      Requesting Attending Physician:  Andi Devon, MD  Requesting Consult Service: PICU    Reason for Consult:    [name] Gross is a 19 y.o. male seen in consultation at the request of Dr. Andi Devon, MD for cirrhosis.    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    He initially presented to [facility] with about 2 weeks of generalized fatigue, cough, posttussive coffee-ground emesis, and abdominal distention.  There, he was found to have AKI with ongoing coffee-ground emesis.    Pediatric critical care and pediatric GI teams have asked [place] to assist in management. Labs are notable for Hgb 7.7 (from recent baseline 11), schistocytes on smear, Cr 2.7 on presentation to [facility] (with fluctuating baseline 1.1-1.8), liver tests improved from prior, INR not yet checked. Liver ultrasound reveals increasing resistive indices of his hepatic artery and large volume abdominopelvic ascites. Last liver biopsy in October 2020 showed ductopenia suggestive of chronic rejection, severe hepatocanalicular cholestasis, along with perivenular, periportal and portal fibrosis.       We recommend the following for evaluation and management of AKI, GI bleed, and malnutrition.  Would avoid over transfusion in the setting of possible esophageal varices.  Although this is unlikely to represent a variceal bleed. Will plan for potential EGD tomorrow in GI procedures. The risk of the procedure including but not limited to anesthesia complications, bleeding, infection, and perforation were reviewed with the patient and his mother at bedside and the consent was reviewed. Recommendations:  - agree with performing diagnostic paracentesis  - urinalysis, urine Na/Cr/urea  - check INR  - check daily CBC, CMP, INR  - trend H/H q8h, transfuse for Hgb <7  - maintain access with 2 large bore IVs (18G or greater)  - maintain active type and screen  - administer 50g 25% albumin today  - agree with empiric antibiotic coverage  - start protonix 40mg  IV BID  - start IV octreotide gtt (Bolus 50 mcg IV x 1 then infuse 50 mcg/hr)  - hold home diuretics in setting of AKI  - NPO at midnight  - will plan potentially for EGD tomorrow, 2/1, in GI procedures for evaluation of possible UGI bleed and placement of temporary nasogastric feeding tube      Thank you for this consult.  Recommendations discussed directly with pediatric GI team.  Patient seen and discussed with Dr. Piedad Climes. We will continue to follow along. Please page the GI Hepatology consult pager at [phone] with any further questions or change in clinical condition.    Tammi Sou, MD  Gastroenterology & Hepatology Fellow, PGY-4  [facility]      HPI:     Chief Complaint: cough    History of Present Illness:   This is a 19 y.o. male with PMHx as noted below who presented as a transfer from [facility] for further evaluation of end-stage liver disease.    [name] Gross reports 2 weeks of progressively worsening appetite, fatigue, cough, and coffee-ground emesis.  He also reports abdominal distention.  His last episode of coffee ground emesis was yesterday.  He has been hypotensive since arrival with improvement since receiving blood and IV albumin. Diagnostic paracentesis was performed and negative for SBP. Labs notable for WBC 10.8, Hgb 7.7 (from 11 one month prior), Plts 95. Peripheral smear with moderate schistocytes. Na 130, K 3, CO2 14, Cr 1.92 (baseline 1-1.8), albumin 1.8, Tbili 22, AST 423, ALT 240, AlkP 1205, GGT 256.    Review of Systems:  The balance of 12 systems reviewed is negative except as noted in the HPI.     Medical History:     Past Medical History:  Past Medical History:   Diagnosis Date   ??? 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 Depression    ??? MSSA bacteremia 08/27/2019   ??? Seasonal allergies    ??? Sickle cell trait (CMS-HCC)    ??? Strep Mitis Central line-associated bloodstream infection 08/27/2019       Surgical History:  Past Surgical History:   Procedure Laterality Date   ??? FRENULECTOMY, LINGUAL     ??? PR ERCP REMOVE FOREIGN BODY/STENT BILIARY/PANC DUCT N/A 08/20/2017    Procedure: ENDOSCOPIC RETROGRADE CHOLANGIOPANCREATOGRAPHY (ERCP); W/ REMOVAL OF FOREIGN BODY/STENT FROM BILIARY/PANCREATIC DUCT(S);  Surgeon: Vonda Antigua, MD;  Location: GI PROCEDURES MEMORIAL [facility];  Service: Gastroenterology   ??? PR ERCP REMOVE FOREIGN BODY/STENT BILIARY/PANC DUCT N/A 06/04/2018    Procedure: ENDOSCOPIC RETROGRADE CHOLANGIOPANCREATOGRAPHY (ERCP); W/ REMOVAL OF FOREIGN BODY/STENT FROM BILIARY/PANCREATIC DUCT(S);  Surgeon: Chriss Driver, MD;  Location: GI PROCEDURES [facility];  Service: Gastroenterology   ??? PR ERCP STENT PLACEMENT BILIARY/PANCREATIC DUCT N/A 11/21/2016    Procedure: ENDOSCOPIC RETROGRADE CHOLANGIOPANCREATOGRAPHY (ERCP); WITH PLACEMENT OF ENDOSCOPIC STENT INTO BILIARY OR PANCREATIC DUCT;  Surgeon: Vonda Antigua, MD;  Location: GI PROCEDURES [facility]ORIAL [facility];  Service: Gastroenterology   ??? PR ERCP,W/REMOVAL STONE,BIL/PANCR DUCTS N/A 08/20/2017    Procedure: ERCP; W/ENDOSCOPIC RETROGRADE REMOVAL OF CALCULUS/CALCULI FROM BILIARY &/OR PANCREATIC DUCTS;  Surgeon: Vonda Antigua, MD;  Location: GI PROCEDURES [facility];  Service: Gastroenterology   ??? PR ERCP,W/REMOVAL STONE,BIL/PANCR DUCTS N/A 06/04/2018    Procedure: ERCP; W/ENDOSCOPIC RETROGRADE REMOVAL OF CALCULUS/CALCULI FROM BILIARY &/OR PANCREATIC DUCTS;  Surgeon: Chriss Driver, MD;  Location: GI PROCEDURES [facility];  Service: Gastroenterology   ??? PR TRANSPLANT LIVER,ALLOTRANSPLANT N/A 09/22/2016    Procedure: LIVER ALLOTRANSPLANTATION; ORTHOTOPIC, PARTIAL OR WHOLE, FROM CADAVER OR LIVING DONOR, ANY AGE;  Surgeon: Doyce Loose, MD;  Location: MAIN OR [facility];  Service: Transplant   ??? PR TRANSPLANT,PREP DONOR LIVER, WHOLE N/A 09/22/2016    Procedure: [facility] STD PREP CAD DONOR WHOLE LIVER GFT PRIOR TNSPLNT,INC CHOLE,DISS/REM SURR TISSU WO TRISEG/LOBE SPLT;  Surgeon: Doyce Loose, MD;  Location: MAIN OR [facility];  Service: Transplant   ??? PR UPGI ENDOSCOPY W/US FN BX N/A 08/20/2017    Procedure: UGI W/ TRANSENDOSCOPIC ULTRASOUND GUIDED INTRAMURAL/TRANSMURAL FINE NEEDLE ASPIRATION/BIOPSY(S), ESOPHAGUS;  Surgeon: Vonda Antigua, MD;  Location: GI PROCEDURES [facility]OR[facility];  Service: Gastroenterology   ??? PR UPPER GI ENDOSCOPY,BIOPSY N/A 07/15/2016    Procedure: UGI ENDOSCOPY; WITH BIOPSY, SINGLE OR MULTIPLE;  Surgeon: Arnold Long Mir, MD;  Location: PEDS PROCEDURE ROOM [facility];  Service: Gastroenterology   ??? PR UPPER GI ENDOSCOPY,CTRL BLEED Left 05/05/2016    Procedure: UGI ENDOSCOPY; WITH CONTROL OF BLEEDING, ANY METHOD;  Surgeon: Arnold Long Mir, MD;  Location: PEDS PROCEDURE ROOM [facility];  Service: Gastroenterology   ??? PR UPPER GI ENDOSCOPY,CTRL BLEED N/A 05/12/2016    Procedure: UGI ENDOSCOPY; WITH CONTROL OF BLEEDING, ANY METHOD;  Surgeon: Annie Paras, MD;  Location: CHILDRENS OR [facility];  Service: Gastroenterology   ??? PR UPPER GI ENDOSCOPY,DIAGNOSIS N/A 09/21/2019    Procedure: UGI ENDO, INCLUDE ESOPHAGUS, STOMACH, & DUODENUM &/OR JEJUNUM; DX W/WO COLLECTION SPECIMN, BY BRUSH OR WASH;  Surgeon: Monte Fantasia, MD;  Location: GI PROCEDURES [facility];  Service: Gastroenterology       Family History:  The patient's family history includes Asthma in his brother; Diabetes in his maternal grandmother and paternal grandmother; Hypertension in his maternal grandfather and paternal grandfather..    Medications:   Current Facility-Administered Medications   Medication Dose Route Frequency Provider Last Rate Last Admin   ??? albumin human 25 % bottle 25 g  25 g Intravenous Q6H Sheran Fava Sites, MD   25 g at 12/11/19 1610   ??? [START ON 12/12/2019] cefepime dilution (MAXIPIME) 40 mg/mL injection 1 g  1 g Intravenous Q24H Elesa Hacker, MD       ??? cholecalciferol (vitamin D3) tablet 5,000 Units  5,000 Units Oral Daily Alecia Lemming, MD   5,000 Units at 12/11/19 0933   ??? fluticasone propionate (FLOVENT HFA) 220 mcg/actuation inhaler 1 puff  1 puff Inhalation Q12H (RT) Alecia Lemming, MD   1 puff at 12/11/19 0900   ??? lactated Ringers infusion  95 mL/hr Intravenous Continuous Alecia Lemming, MD 95 mL/hr at 12/11/19 1100 95 mL/hr at 12/11/19 1100   ??? magnesium oxide (MAG-OX) tablet 400 mg  400 mg Oral BID Alecia Lemming, MD   400 mg at 12/11/19 0939   ??? metroNIDAZOLE (FLAGYL) IVPB 500 mg  500 mg Intravenous [facility] Elesa Hacker, MD 100 mL/hr at 12/11/19 0938 500 mg at 12/11/19 9604   ??? multivitamin, with zinc (AQUADEKS) chewable tablet  2 tablet Oral Daily Alecia Lemming, MD   2 tablet at 12/11/19 0940   ??? ondansetron (ZOFRAN) injection 4 mg  4 mg Intravenous Q8H PRN Alecia Lemming, MD   4 mg at 12/11/19 5409   ??? pantoprazole (PROTONIX) injection 40 mg  40 mg Intravenous BID Alecia Lemming, MD   40 mg at 12/11/19 0600   ??? predniSONE (DELTASONE) tablet 15 mg  15 mg Oral Daily Alecia Lemming, MD   15 mg at 12/11/19 0941   ??? rifAXIMin (XIFAXAN) tablet 550 mg  550 mg Oral BID Alecia Lemming, MD   550 mg at 12/11/19 8119   ??? spironolactone (ALDACTONE) tablet 50 mg  50 mg Oral Daily Alecia Lemming, MD   50 mg at 12/11/19 0943   ??? [START ON 12/13/2019] valGANciclovir (VALCYTE) tablet 450 mg  450 mg Oral Q48H Elesa Hacker, MD           Allergies:   Bee pollen and Pollen extracts    Social History:  Social History     Tobacco Use   ??? Smoking status: Never Smoker ??? Smokeless tobacco: Never Used   ??? Tobacco comment: Step-father smokes outside   Substance Use Topics   ??? Alcohol use: Never     Alcohol/week: 0.0 standard drinks     Frequency: Never     Comment: 1/2 glass of wine on rare occasions   ??? Drug use: Never       Objective:      Vital Signs/Weight:  Temp:  [  36.5 ??C-36.8 ??C] 36.5 ??C  Heart Rate:  [82-119] 89  SpO2 Pulse:  [82-94] 94  Resp:  [13-26] 17  BP: (74-92)/(32-70) 81/39  MAP (mmHg):  [47-78] 52  SpO2:  [100 %] 100 %  BMI (Calculated):  [18.26] 18.26  Wt Readings from Last 3 Encounters:   12/11/19 52.9 kg (116 lb 10 oz) (2 %, Z= -2.03)*   10/24/19 54 kg (119 lb) (3 %, Z= -1.85)*   10/24/19 54 kg (119 lb 0.8 oz) (3 %, Z= -1.85)*     * Growth percentiles are based on CDC (Boys, 2-20 Years) data.       Physical Exam:  GEN: ill-appearing young male laying in bed with head of bed elevated listening to music on his phone  EYES: EOMI, scleral icterus  ENT: ATNC, MMM, OP clear  NECK: supple, no LAD  CV: NRRR  PULM: normal WOB on room air  ABD: +BS, soft, distended abdomen; ascites present; non-tender to palpation  EXT: warm, trace peripheral edema  SKIN: no eruptions on visible skin  NEURO: awake and alert; no gross focal deficits; moves all 4 extremities  PSYCH: blunted affect; engaged and interactive    Diagnostic Studies:  I reviewed all pertinent diagnostic studies, including:      Labs:    Recent Labs     12/11/19  0155   WBC 10.8   HGB 7.7*   HCT 23.1*   PLT 95*     Recent Labs     12/11/19  0137 12/11/19  1119   NA 130* - 132* 133*   K 3.0* - 2.6* 2.8*   CL 108*  --    BUN 18  --    CREATININE 1.92*  --    GLU 112  --      Recent Labs     12/11/19  0137   PROT 4.1*   ALBUMIN 1.8*   AST 423*   ALT 240*   ALKPHOS 1,205*   BILITOT 22.3*     No results for input(s): INR, APTT, FIBRINOGEN in the last 72 hours.  Recent Labs     12/11/19  0137   CRP 34.7*     No results for input(s): IRON, TIBC, FERRITIN in the last 72 hours.    MELD-Na score: 31 at 11/08/2019  9:25 AM MELD score: 28 at 11/08/2019  9:25 AM  Calculated from:  Serum Creatinine: 1.78 mg/dL at 16/08/9603  5:40 AM  Serum Sodium: 129 mmol/L at 11/08/2019  9:25 AM  Total Bilirubin: 39.9 mg/dL at 98/09/9146  8:29 AM  INR(ratio): 1.20 at 11/08/2019  9:25 AM  Age: 63 years 7 months     Imaging:   U/S liver transplant 12/11/19:  - Patent hepatic transplant vasculature.  - Interval increase in resistive indices noted throughout the hepatic arteries which are within upper limits of normal/minimally elevated, as above.  - Increased diameter common bile duct. No stone identified.  - Heterogeneous liver echotexture, which is somewhat nonspecific, however, can be seen in setting of underlying liver disease. Slight interval decrease in liver measurement with increased size spleen.  - Large-volume abdominopelvic ascites, similar to prior.  - Bilateral echogenic kidneys, nonspecific, however, can be seen with chronic medical renal disease.        GI Procedures:   EGD 09/21/19:    - Grade I esophageal varices with no bleeding and no  stigmata of recent bleeding.                         - A medium amount of food (residue) in the stomach.                          This solid food obstructed visualization of the                          fundus, therefore gastric varices could not be ruled                          out.                         - Normal examined duodenum.                         - No specimens collected.    ERCP 06/04/18:     - One visibly patent stent from the biliary tree was seen                      in the major papilla.                     - Prior biliary sphincterotomy appeared open.                     - A filling defect consistent with a stone and sludge was                      seen on the cholangiogram.                     - One stent was removed from the biliary tree.                     - The biliary tree was swept.                     - Choledocholithiasis was found. Complete removal was accomplished by balloon extraction.    Upper EUS 08/20/17:  - Normal esophagus.                     - Normal stomach.                     - Normal examined duodenum.                     - There was no evidence of significant pathology in the                      left lobe of the liver. Fine needle biopsy performed.                     - Normal lymph nodes were visualized.  Pathology:

## 2019-12-12 ENCOUNTER — Encounter
Admit: 2019-12-12 | Discharge: 2019-12-13 | Payer: PRIVATE HEALTH INSURANCE | Attending: Pediatric Gastroenterology | Primary: Pediatric Gastroenterology

## 2019-12-12 LAB — URINE CULTURE: Culture: NO GROWTH

## 2019-12-12 LAB — CBC W/ AUTO DIFF
BASOPHILS ABSOLUTE COUNT: 0.1 10*9/L (ref 0.0–0.1)
BASOPHILS RELATIVE PERCENT: 0.7 %
EOSINOPHILS ABSOLUTE COUNT: 0 10*9/L (ref 0.0–0.4)
EOSINOPHILS RELATIVE PERCENT: 0.1 %
HEMATOCRIT: 23.6 % — ABNORMAL LOW (ref 41.0–53.0)
HEMOGLOBIN: 8.2 g/dL — ABNORMAL LOW (ref 13.5–17.5)
LARGE UNSTAINED CELLS: 1 % (ref 0–4)
LYMPHOCYTES ABSOLUTE COUNT: 0.4 10*9/L — ABNORMAL LOW (ref 1.5–5.0)
LYMPHOCYTES RELATIVE PERCENT: 5 %
MEAN CORPUSCULAR HEMOGLOBIN CONC: 34.8 g/dL (ref 31.0–37.0)
MEAN CORPUSCULAR HEMOGLOBIN: 30.1 pg (ref 26.0–34.0)
MEAN CORPUSCULAR VOLUME: 86.6 fL (ref 80.0–100.0)
MEAN PLATELET VOLUME: 7.8 fL (ref 7.0–10.0)
MONOCYTES ABSOLUTE COUNT: 0.5 10*9/L (ref 0.2–0.8)
MONOCYTES RELATIVE PERCENT: 5.8 %
NEUTROPHILS ABSOLUTE COUNT: 7.8 10*9/L — ABNORMAL HIGH (ref 2.0–7.5)
NEUTROPHILS RELATIVE PERCENT: 87.8 %
PLATELET COUNT: 124 10*9/L — ABNORMAL LOW (ref 150–440)
RED CELL DISTRIBUTION WIDTH: 23.2 % — ABNORMAL HIGH (ref 12.0–15.0)
WBC ADJUSTED: 8.9 10*9/L (ref 4.5–11.0)

## 2019-12-12 LAB — COMPREHENSIVE METABOLIC PANEL
ALKALINE PHOSPHATASE: 1054 U/L — ABNORMAL HIGH (ref 65–260)
ALT (SGPT): 142 U/L — ABNORMAL HIGH (ref <50)
ANION GAP: 10 mmol/L (ref 7–15)
AST (SGOT): 203 U/L — ABNORMAL HIGH (ref 19–55)
BLOOD UREA NITROGEN: 18 mg/dL (ref 7–21)
BUN / CREAT RATIO: 9
CALCIUM: 8.1 mg/dL — ABNORMAL LOW (ref 8.5–10.2)
CHLORIDE: 109 mmol/L — ABNORMAL HIGH (ref 98–107)
CO2: 14 mmol/L — ABNORMAL LOW (ref 22.0–30.0)
CREATININE: 1.9 mg/dL — ABNORMAL HIGH (ref 0.70–1.30)
EGFR CKD-EPI AA MALE: 58 mL/min/{1.73_m2} — ABNORMAL LOW (ref >=60)
EGFR CKD-EPI NON-AA MALE: 50 mL/min/{1.73_m2} — ABNORMAL LOW (ref >=60)
GLUCOSE RANDOM: 147 mg/dL — ABNORMAL HIGH (ref 70–99)
POTASSIUM: 3.5 mmol/L (ref 3.5–5.0)
PROTEIN TOTAL: 4.6 g/dL — ABNORMAL LOW (ref 6.5–8.3)
SODIUM: 133 mmol/L — ABNORMAL LOW (ref 135–145)

## 2019-12-12 LAB — PROTIME-INR
INR: 2.03
INR: 1.99

## 2019-12-12 LAB — URINALYSIS
BLOOD UA: NEGATIVE
GLUCOSE UA: NEGATIVE
HYALINE CASTS: 4 /LPF — ABNORMAL HIGH (ref 0–1)
NITRITE UA: NEGATIVE
PH UA: 6 (ref 5.0–9.0)
PROTEIN UA: NEGATIVE
RBC UA: <1 /HPF (ref <=3)
SPECIFIC GRAVITY UA: 1.008 (ref 1.003–1.030)
SQUAMOUS EPITHELIAL: <1 /HPF (ref 0–5)
UROBILINOGEN UA: 4 — AB
WBC UA: 1 /HPF (ref <=2)

## 2019-12-12 LAB — HEMATOCRIT
Hematocrit:VFr:Pt:Bld:Qn:: 23.9 — ABNORMAL LOW
Hematocrit:VFr:Pt:Bld:Qn:: 26.1 — ABNORMAL LOW

## 2019-12-12 LAB — BILIRUBIN DIRECT: Bilirubin.glucuronidated+Bilirubin.albumin bound:MCnc:Pt:Ser/Plas:Qn:: 28.3 — ABNORMAL HIGH

## 2019-12-12 LAB — HEPATIC FUNCTION PANEL
ALT (SGPT): 142 U/L — ABNORMAL HIGH (ref <50)
AST (SGOT): 203 U/L — ABNORMAL HIGH (ref 19–55)
BILIRUBIN DIRECT: 28.3 mg/dL — ABNORMAL HIGH (ref 0.00–0.40)
BILIRUBIN TOTAL: 30.8 mg/dL — ABNORMAL HIGH (ref 0.0–1.2)
PROTEIN TOTAL: 4.6 g/dL — ABNORMAL LOW (ref 6.5–8.3)

## 2019-12-12 LAB — INR: Coagulation tissue factor induced.INR:RelTime:Pt:PPP:Qn:Coag: 1.99

## 2019-12-12 LAB — SODIUM URINE: Lab: 59

## 2019-12-12 LAB — CREATININE, URINE: Lab: 47.1

## 2019-12-12 LAB — D-DIMER QUANTITATIVE (CH,ML,PD,ET): Fibrin D-dimer DDU:MCnc:Pt:PPP:Qn:: 268 — ABNORMAL HIGH

## 2019-12-12 LAB — APTT: Coagulation surface induced:Time:Pt:PPP:Qn:Coag: 44.7 — ABNORMAL HIGH

## 2019-12-12 LAB — POTASSIUM: Potassium:SCnc:Pt:Ser/Plas:Qn:: 3.5

## 2019-12-12 LAB — PROTIME: Coagulation tissue factor induced:Time:Pt:PPP:Qn:Coag: 23.4 — ABNORMAL HIGH

## 2019-12-12 LAB — PHOSPHORUS: Phosphate:MCnc:Pt:Ser/Plas:Qn:: 4.3

## 2019-12-12 LAB — MAGNESIUM: Magnesium:MCnc:Pt:Ser/Plas:Qn:: 1.8

## 2019-12-12 LAB — LEUKOCYTE ESTERASE UA: Leukocyte esterase:PrThr:Pt:Urine:Ord:Test strip: NEGATIVE

## 2019-12-12 LAB — HEMOGLOBIN AND HEMATOCRIT, BLOOD: HEMATOCRIT: 23.9 % — ABNORMAL LOW (ref 41.0–53.0)

## 2019-12-12 LAB — HEMOGLOBIN: Hemoglobin:MCnc:Pt:Bld:Qn:: 8.2 — ABNORMAL LOW

## 2019-12-12 LAB — VANCOMYCIN RANDOM
Vancomycin^random:MCnc:Pt:Ser/Plas:Qn:: 17.7
Vancomycin^random:MCnc:Pt:Ser/Plas:Qn:: 21.6

## 2019-12-12 LAB — FIBRINOGEN LEVEL: Fibrinogen:MCnc:Pt:PPP:Qn:Coag: 163 — ABNORMAL LOW

## 2019-12-12 LAB — UREA NITROGEN URINE: Lab: 273

## 2019-12-12 NOTE — Unmapped (Signed)
PEDIATRIC GASTROENTEROLOGY FELLOW F/U INPATIENT CONSULT NOTE    GI Attending Physician: Donzetta Sprung, MD    Requesting Attending Physician:  Lorenza Burton, MD    Reason for Followup:     [name] Gross is a 19 y.o. male seen in follow-up for liver failure.    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 Most likely etiology of coffee ground emesis is esophageal varices secondary to longstanding portal hypertension from his cirrhotic liver. Ultimate treatment for patient's condition is a liver transplant. As such, the adult transplant hepatology service, adult hepatology, and his pediatric hepatologist Dr. Melrose Nakayama are following him closely and he is being evaluated for liver re-transplant candidacy. To address patient's upper GI bleed, an upper endoscopy with banding is merited. This procedure was deferred today due to hypotension, and it will be performed by adult liver team tentatively tomorrow as long as his blood pressure continues to improve.    PLAN  - Agree with upper endoscopy with esophageal banding. Appreciate adult hepatology team's input  - Agree with workup for liver transplant workup per adult transplant hepatology service, adult hepatology, and his pediatric hepatologist Dr. Melrose Nakayama  - Recommend interventions such as pressors, albumin, and IV hydration per PICU team to maintain adequate perfusion.  - Agree with pantoprazole 40 mg BID  - Recommend fat soluble vitamin supplementation.   - Thank you for this consult. Please reach out as questions arise.    SUBJECTIVE        This is a 19 y.o. 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 [name] Gross had multiple hypotensive episodes today that led the PICU team to start an octreotide drip. He was scheduled for an upper endoscopy with variceal banding today, but that procedure was deferred until tomorrow due to hypotension. [name] Gross expressed extreme frustration to our team that the procedure was not going to happen today. [name] Gross reports feeling tired. He states that his abdomen is slightly more bloated than usual. No fevers or vomiting. He is very weak. [name] Gross is currently on a liquid diet and is tolerating sips of water.    [name] Gross is currently living with his mother. He is on hospice.    OBJECTIVE    Medications:  Current Facility-Administered Medications   Medication Dose Route Frequency Provider Last Rate Last Admin   ??? cholecalciferol (vitamin D3) tablet 5,000 Units  5,000 Units Oral Daily Alecia Lemming, MD   5,000 Units at 12/11/19 0933   ??? fluticasone propionate (FLOVENT HFA) 220 mcg/actuation inhaler 1 puff  1 puff Inhalation Q12H (RT) Alecia Lemming, MD   1 puff at 12/11/19 1935   ??? heparin 1,000 units/500 mL (2 units/mL) in 0.9% NaCl infusion  3 mL/hr Intravenous Continuous Alecia Lemming, MD 3 mL/hr at 12/12/19 0700 3 mL/hr at 12/12/19 0700   ??? lactated Ringers infusion  95 mL/hr Intravenous Continuous Alecia Lemming, MD 95 mL/hr at 12/12/19 0700 95 mL/hr at 12/12/19 0700   ??? magnesium oxide (MAG-OX) tablet 400 mg  400 mg Oral BID  Alecia Lemming, MD   400 mg at 12/11/19 2200   ??? metroNIDAZOLE (FLAGYL) IVPB 500 mg  500 mg Intravenous [facility] Elesa Hacker, MD   Stopped at 12/11/19 2303   ??? multivitamin, with zinc (AQUADEKS) chewable tablet  2 tablet Oral Daily Alecia Lemming, MD   2 tablet at 12/11/19 0940   ??? norepinephrine 8 mg in sodium chloride 0.9 % 250 mL (0.032 mg/mL) infusion  0-3 mcg/kg/min (Dosing Weight) Intravenous Continuous Elesa Hacker, MD       ??? octreotide 500 mcg in sodium chloride 0.9 % 100 mL (5 mcg/mL) infusion  50 mcg/hr Intravenous Continuous [name], MD 10 mL/hr at 12/12/19 0700 50 mcg/hr at 12/12/19 0700   ??? ondansetron (ZOFRAN) injection 4 mg  4 mg Intravenous Q8H PRN Alecia Lemming, MD   4 mg at 12/12/19 0128   ??? pantoprazole (PROTONIX) injection 40 mg  40 mg Intravenous BID Alecia Lemming, MD   40 mg at 12/11/19 1749   ??? predniSONE (DELTASONE) tablet 15 mg  15 mg Oral Daily Alecia Lemming, MD   15 mg at 12/11/19 0941   ??? rifAXIMin (XIFAXAN) tablet 550 mg  550 mg Oral BID Alecia Lemming, MD   550 mg at 12/11/19 2200   ??? spironolactone (ALDACTONE) tablet 50 mg  50 mg Oral Daily Alecia Lemming, MD   50 mg at 12/11/19 0943   ??? [START ON 12/13/2019] valGANciclovir (VALCYTE) tablet 450 mg  450 mg Oral Q48H Elesa Hacker, MD           Vital Signs:  Temp:  [36.5 ??C-37 ??C] 36.8 ??C  Heart Rate:  [81-119] 85  SpO2 Pulse:  [81-167] 88  Resp:  [12-37] 18  BP: (80-90)/(34-58) 88/58  MAP (mmHg):  [49-68] 68  A BP-1: (93-94)/(51-52) 93/52  MAP:  [65 mmHg] 65 mmHg  A BP-2: (83-116)/(41-68) 87/44  MAP:  [55 mmHg-84 mmHg] 57 mmHg  FiO2 (%):  [21 %] 21 %  SpO2:  [100 %] 100 %    Physical Exam:  Normal comprehensive exam:     Constitutional:   Very thin male lying in bed   Mental Status:   Appears anxious and frustrated   HEENT:   PERRL, conjunctiva icteric, anicteric, oropharynx clear, neck supple, no LAD.   Respiratory: Clear to auscultation, unlabored breathing.     Cardiac: Euvolemic, mildly tachycardic rate and rhythm, normal S1 and S2, no murmur. +1 pulses     Abdomen: Soft, normal bowel sounds, moderately-distended, fluid wave present, moderately-tender throughout abdomen, no organomegaly or masses.     Perianal/Rectal Exam Not performed.     Extremities:   No edema, decreased muscle bulk   Musculoskeletal: No joint swelling or tenderness noted, no deformities.     Skin: Appears jaundiced. No rashes or skin lesions noted.     Neuro: Resting tremor present in hands. No focal deficits. Diagnostic Studies:   I reviewed all pertinent diagnostic studies, including:    Lab:  CBC - Results in Past 2 Days  Result Component Current Result   WBC 8.9 (12/12/2019)   RBC 2.73 (L) (12/12/2019)   HGB 9.0 (L) (12/12/2019)   HCT 26.1 (L) (12/12/2019)   Platelet 124 (L) (12/12/2019)     BMP - Results in Past 2 Days  Result Component Current Result   Glucose 147 (H) (12/12/2019)   Sodium 133 (L) (12/12/2019)   Potassium 3.5 (12/12/2019)   Chloride 109 (H) (  12/12/2019)   CO2 14.0 (L) (12/12/2019)   BUN 18 (12/12/2019)   Creatinine 1.90 (H) (12/12/2019)   Calcium 8.1 (L) (12/12/2019)     Coagulation - Results in Past 2 Days  Result Component Current Result   PT 22.9 (H) (12/12/2019)   INR 1.99 (12/12/2019)   APTT 44.7 (H) (12/12/2019)     LFT's - Results in Past 2 Days  Result Component Current Result   ALT 142 (H) (12/12/2019)    142 (H) (12/12/2019)   AST 203 (H) (12/12/2019)    203 (H) (12/12/2019)   Alkaline Phosphatase 1,054 (H) (12/12/2019)    1,054 (H) (12/12/2019)     Amylase -   No results found for requested labs within last 2 days.     Lipase -   No results found for requested labs within last 2 days.     C-Reactive Protein - Results in Past 2 Days  Result Component Current Result   CRP 34.7 (H) (12/11/2019)     Erythrocyte Sedimentation Rate -   No results found for requested labs within last 2 days.     Albumin - Results in Past 2 Days  Result Component Current Result   Albumin 2.4 (L) (12/12/2019)    2.4 (L) (12/12/2019)       Imaging:   Radiology studies were personally reviewed    EXAM: US LIVER TRANSPLANT  DATE: 12/11/2019 8:34 AM  ACCESSION: [id number]  DICTATED: 12/11/2019 9:03 AM  INTERPRETATION LOCATION: [facility]  ??  CLINICAL INDICATION: Male, 19 years old with evaluate for portal venous thrombosis or change in liver parynchema from prior studies   ??  COMPARISON: Liver transplant ultrasound dated 09/18/2019, and other earlier studies.  ??  TECHNIQUE: Ultrasound imaging of the upper abdomen was obtained with gray scale, color Doppler, and spectral Doppler analysis using a curvilinear transducer.  ??  FINDINGS:    The liver measured 14.8 cm in length (previously 15.4 cm) with a heterogeneous diffusely increased echotexture. The spleen measured 12.8 cm in length (previously 12.2 cm) with a homogenous echotexture. Tiny adjacent splenule is noted.  ??  The common bile duct measured 5 mm (previously 2.6 mm). The gallbladder is surgically absent.  ??  Large volume of abdominopelvic ascites, similar to prior.  ??  The visualized proximal aorta and IVC were unremarkable.  No gross retroperitoneal adenopathy was noted.  The right kidney measured 11.9 cm in length (previously 11.2 cm) with the left measuring 12.5 cm in length (previously 12.8 cm).  Minimal upper and lower pole caliectasis noted on the right, without evidence of overt hydronephrosis. No renal calculi, or focal solid renal mass was seen. The visualized pancreas was unremarkable.  ??  The main portal vein measured 1.2 cm.  The flow within the portal venous system and splenic vein was hepatopetal.  The main portal vein flow velocities were as follows:  preanastomosis, 0.35 (previously 0.46); at the anastomosis, 0.37 (previously 0.5); and post-anastomosis, 0.3 (previously 0.56) meters/second.  ??  The waveforms for the hepatic veins and IVC were as follows:  right hepatic vein, biphasic (previously biphasic); middle hepatic vein, biphasic (previously biphasic); left hepatic vein, biphasic (previously biphasic); and IVC, biphasic (previously biphasic).   ??  The hepatic arterial resistive indices were as follows:  common hepatic artery, 0.79 (previously 0.7); right hepatic artery, 0.81 (previously 0.63); and left hepatic artery, 0.76 (previously 0.68).  The SATs were as follows:  common hepatic artery, 38 ms (previously 33); right hepatic artery, 33  ms (previously 38); and left hepatic artery, 46 ms (previously 42).]  ??  The urinary bladder was moderately full with a calculated volume of 57 mL. Bilateral ureteral jets are noted. No bladder debris or urothelial thickening.  ??  IMPRESSION:  - Patent hepatic transplant vasculature.  - Interval increase in resistive indices noted throughout the hepatic arteries which are within upper limits of normal/minimally elevated, as above.  - Increased diameter common bile duct. No stone identified.  - Heterogeneous liver echotexture, which is somewhat nonspecific, however, can be seen in setting of underlying liver disease. Slight interval decrease in liver measurement with increased size spleen.  - Large-volume abdominopelvic ascites, similar to prior.  - Bilateral echogenic kidneys, nonspecific, however, can be seen with chronic medical renal disease.  ??  Sallyanne Havers, MD  [facility] Pediatric Gastroenterology Fellow PGY-5      I performed a history and physical examination of the patient and discussed the patient's management with the family and the fellow. I personally reviewed the results of all tests, including blood work and images. I reviewed and edited the fellow's note and agree with the documented findings and plan of care. I provided direct supervision to the care of the patient. I spent over 50% of a total 40 min counseling and/or coordinating the care of this patient.  Focus at this point remains BP stabilization.  Once accomplished, plan will be for endoscopic evaluation and possible variceal banding with Adult Hepatology service.  Once stabilized, can focus on nutritional supplementation and achieving improved fluid balance.     Rocco Pauls, MD  Pediatric Gastroenterology

## 2019-12-12 NOTE — Unmapped (Signed)
PICU Progress Note  Interval events: No urine output overnight, bladder scanned and voided ~800 ml. On octreotide infusion. LR bolus x 1 for low BPs    LOS: 1 days    Assessment: 19 y.o. 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 Underlying etiology of his emesis which presumably led to dehydration and pre-renal AKI still unclear, differential diagnosis at this time includes spontaneous bacterial peritonitis, variceal burden/bleeding, and, less likely given creatinine downtrending in response to fluid resuscitation, hepatorenal syndrome. Adult hepatology and pediatric GI consulted, initially recommended octreotide infusion with endoscopy when hemodynamically stable. Blood pressures have been low, this morning 50s/30s, despite aggressive fluid resuscitation, will initiate stress dose steroids today based on suspicion of adrenal suppression given chronic prednisone use. Clear liquid diet, will make NPO for EGD tomorrow.    Plan:     Resp: hx of asthma  - SORA  - Home flovent 1 puff BID  - Albuterol PRN  ??  CV: hypotensive, continuing albumin and volume resuciations  - CRM  - s/p albumin 25 % 25 g x 5  ??  Heme: Repeat PVL on 11/13 demonstrate stable DVTs in RUE axial and brachial veins, not on therapeutic anticoagulation  - CBCd   - SCDs for ppx  ??  ID:  - Cont Cefepime Flagyl and Vancomycin  - Blood Cx 1/30 at [facility] no growth to date  - Peritoneal cultures pending 1/31  - holding home Bactrim ppx (usually M-W-F) due to AKI  - home valganciclovir daily  ??  Neuro:  - neuro checks q4h  ??  FEN/GI: AKI on admission with Cr 2.7, likely prerenal in the setting of dehydration vs. hepatorenal syndrome  - Strict I/O's  - clear liquid diet, NPO at 0000              - when able to resume diet, order low-salt diet  - mIVFs via LR @95ml /hr  - IV Zofran 4mg  q8h PRN  - Chem 10 q12h  - IV Protonix 40mg  BID  - Home aquadeks, mag-ox, vit D3  - adult hepatology, peds GI consulted   -likely postpone endoscopy until more stable  ??  Hepatology:   - adult hepatology, peds GI consulted   -endoscopy planned for 2/2  - d/c octreotide infusion  - hold home mycophenolate and tacrolimus given nephrotoxicity per Peds GI  - home spironolactone 50mg  daily  - home rifaximin 550mg  BID  - holding prednisone 15mg  daily while on stress dose steroids  - HFP daily  - obtain US liver transplant  ??  ENDO: hx of steroid-induced hyperglycemia, previously on insulin but not currently. Euglycemic on admission.   - stress dose-steroids   ??  DERM: steroid-induced acne and atypical dermatitis  - hold home topical clindamycin and benzoyl peroxide  ??  Psych: Seen previously by Dr. Jessee Avers, set up with [facility]  - consulted children's supportive care  - consulted adult pscyh    RASS goal: not sedated  DVT ppx: None     Changes to Lines/Tubes: None   Family communication: Mom updated at bedside   Dispo: PICU    PICU Resident Pager: [phone]  PICU Resident Phone: [phone]    Vitals:  Dosing weight: 52.9 kg (116 lb 10 oz) (12/11/19 0456)  Most recent weight: 52.9 kg (  116 lb 10 oz) (12/11/19 0200)    Temp:  [36.5 ??C-37 ??C] 36.8 ??C  Heart Rate:  [81-119] 85  SpO2 Pulse:  [81-167] 88  Resp:  [12-37] 18  BP: (79-90)/(32-58) 88/58  MAP (mmHg):  [47-68] 68  A BP-1: (93-94)/(51-52) 93/52  MAP:  [65 mmHg] 65 mmHg  A BP-2: (83-116)/(41-68) 87/44  MAP:  [55 mmHg-84 mmHg] 57 mmHg  FiO2 (%):  [21 %] 21 %  SpO2:  [100 %] 100 %     I/O       01/30 0701 - 01/31 0700 01/31 0701 - 02/01 0700    I.V. (mL/kg) 441.8 (8.4) 2107.9 (39.8)    Blood  275    IV Piggyback 125 1347    Total Intake 566.8 3729.9    Urine (mL/kg/hr) 0 825 (0.6)    Stool  0    Total Output(mL/kg) 0 (0) 825 (15.6)    Net +566.8 +2904.9          Urine Occurrence  1 x    Stool Occurrence  1 x Physical Exam:  General: cachetic, tremulous but in no apparent distress  Head: Graves/AT, eyes are exopthalmus  Eyes:   Scleral icterus  Oropharynx:  Mucous membranes moist  Lungs:  Clear to ascultation bilaterally, no wheezes or crackles, no tachypnea, normal WOB on room air  Heart:   Normal S1 and S2, no murmurs, radial pulses palpable  Abdomen: surgical scars on abdomen present. Abdomen is round and full, but soft. Reports nontender but starts coughing/gagging with palpation. Unable to assess for fluid wave or organomegaly.  Neuro:  Tired-appearing with generalized weakness and tremulousness. Hand tremor present. Answers questions in short phrases, cooperates with exam. No focal deficits.  Extremities:  Thin, muscle wasting  Skin:  Full body jaundice, skin warm and dry, no pitting edema    Labs and studies reviewed. Pertinent results include :    Lab Results   Component Value Date    WBC 8.9 12/12/2019    HGB 8.2 (L) 12/12/2019    HCT 23.6 (L) 12/12/2019    PLT 124 (L) 12/12/2019       Lab Results   Component Value Date    NA 133 (L) 12/12/2019    K 3.5 12/12/2019    CL 109 (H) 12/12/2019    CO2 14.0 (L) 12/12/2019    BUN 18 12/12/2019    CREATININE 1.90 (H) 12/12/2019    GLU 147 (H) 12/12/2019    CALCIUM 8.1 (L) 12/12/2019    MG 1.8 12/12/2019    PHOS 4.3 12/12/2019       Lab Results   Component Value Date    BILITOT 30.8 (H) 12/12/2019    BILITOT 30.8 (H) 12/12/2019    BILIDIR 28.30 (H) 12/12/2019    PROT 4.6 (L) 12/12/2019    PROT 4.6 (L) 12/12/2019    ALBUMIN 2.4 (L) 12/12/2019    ALBUMIN 2.4 (L) 12/12/2019    ALT 142 (H) 12/12/2019    ALT 142 (H) 12/12/2019    AST 203 (H) 12/12/2019    AST 203 (H) 12/12/2019    ALKPHOS 1,054 (H) 12/12/2019    ALKPHOS 1,054 (H) 12/12/2019    GGT 256 (H) 12/11/2019       Lab Results   Component Value Date    PT 23.4 (H) 12/12/2019    INR 2.03 12/12/2019    APTT 44.7 (H) 12/12/2019       Lines/Tubes:   Patient Lines/Drains/Airways Status  Active Active Lines, Drains, & Airways Name:   Placement date:   Placement time:   Site:   Days:    Peripheral IV 09/30/19 Left;Posterior Forearm   09/30/19    0958    Forearm   72    Arterial Line 12/11/19 Left Radial   12/11/19    1745    Radial   less than 1    Arterial Infusion Catheter 12/11/19 2.5 2.5 cm Non-tunneled   12/11/19    1745     less than 1                                 Sheilah Mins, MD       [facility], PGY-2         Pager: [phone]

## 2019-12-12 NOTE — Unmapped (Signed)
Pediatric Palliative Care Consult Note    [name] Gross is a 19 y.o. male seen for pediatric palliative care consultation at the request of Dr. Andi Devon for family support.    Primary Care Provider: [name], MD    History provided by: Patient-[name] Gross and Mother-[name] Gross    Assessment and Summary of Discussion:   [name] Gross is a 19 y.o. 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  Palliative care consult requested for family support.    The palliative care team met with [name] Gross and his mother, [name] Gross, at bedside this afternoon. [name] Gross is well known to the Supportive Care Team - he verbalized understanding our role from prior hospitalizations. [name] Gross verbalized understanding of his current clinical course including concern for GI bleed in context of known liver failure. He verbalized frustration that occasionally, procedures and interventions have to be postponed including when concern arises for critical condition. [name] Gross endorsed frequently being told that he is polite and nice, but he verbalized wanting to see results. In the short term, he is hopeful to have the EGD performed as soon as possible so that he may eat solid foods. He also hopes to have ongoing conversations w/ his hepatology teams about potential long-term options for treating his liver disease (ie transplant). In the long-term, he hopes to return to school to become a Runner, broadcasting/film/video or a Engineer, civil (consulting). Angus Palms and [name] Gross spoke at length about shared decision-making process in context of [name] Gross recently moving back in with his mother, who is actively involved in his care - please see palliative care team MSW documentation (dated 12/12/19) for additional details.    Recommendations:   1. SYMPTOMS:   - Introduced role of our team in assisting with symptom management.  - Krystal denied pain/discomfort or other distressing symptoms during our visit.  - FEN/GI: EGD with variceal banding for upper GI bleed initially planned for today postponed due to hypotension.  Currently on clear liquid diet.  ?? Appreciate involvement of Peds GI team and adult hepatology.  - Mood: H/o major depressive disorder.  Orvin was willing to engage with our team but expressed several frustrations with his care (e.g. EGD being canceled, not being able to eat solid foods).  ?? Appreciate involvement of psychiatry.       2. GOALS:   - Discussed goals of care, aligning medical decisions with care goals.   - Winton prefers consistency with information and care when possible.  - Mother is concerned about [name] Gross' mental health and appreciates psychiatry's continued involvement.  - Damani has long term goals which include going back to school and becoming a Runner, broadcasting/film/video or nurse.    3. DECISIONS:   - Discussed role of our team in supporting children and families in treatment decision-making.  - Not facing immediate medical decisions at this time but will face difficult decisions regarding future care, including repeat transplant if he is a candidate.    4. ADVANCE CARE PLANNING  - Advance care planning not discussed today.  - Code status: Full code.  - Prognosis: Guarded given severity of liver disease and potential for lack of response to salvage therapies, as well as AKI, and complex social situation.    5. SUPPORT:   - [name] Gross has expanded his support  network by reconnecting with many family members who have expressed their desire to support him and assist with his care.  He is currently living with his mother who is present with him during this hospitalization and is active in his care.  She is working to balance her role as mother with [name]' right to autonomy and the right to direct his own care.  - Discussed hospital based resources.    6. CARE COORDINATION:   - Care coordinated with PICU team and nursing.  - Supportive care team available for team / family meetings as needed.    Total time spent with patient for evaluation & management (excluding ACP documented separately): 60 minutes (1:15-2:15pm)  Greater than 50% of this time spent on counseling/coordination of care: Yes.  See ACP Note from today for additional billable service: No.      We appreciate the consult. The Children's Supportive Care Team can be reached by pager [phone] [name]) or email ([email or web address]).       History of Present Illness:   [name] Gross is a 19 y.o. 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  Etiology of coffee ground emesis thought to be secondary to esophageal varices in context of longstanding portal hypertension.  [place] was being evaluated for repeat liver transplant prior to this hospitalization.     Current hospital course significant for hypotension requiring stress dose steroids, IV fluids and albumin. He is on broad spectrum antibiotics in context of GI bleed and risk of SBP. Pediatric and adult hepatology teams following. Blood Cx on 1/30 is NGTD. Peritoneal cultures on 1/31 pending. EGD rescheduled for 2/2.    Past Medical History:   Past Medical History:   Diagnosis Date   ??? 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 Depression    ??? MSSA bacteremia 08/27/2019   ??? Seasonal allergies    ??? Sickle cell trait (CMS-HCC)    ??? Strep Mitis Central line-associated bloodstream infection 08/27/2019       Past Surgical History:   Procedure Laterality Date   ??? FRENULECTOMY, LINGUAL     ??? PR ERCP REMOVE FOREIGN BODY/STENT BILIARY/PANC DUCT N/A 08/20/2017    Procedure: ENDOSCOPIC RETROGRADE CHOLANGIOPANCREATOGRAPHY (ERCP); W/ REMOVAL OF FOREIGN BODY/STENT FROM BILIARY/PANCREATIC DUCT(S);  Surgeon: Vonda Antigua, MD;  Location: GI PROCEDURES MEMORIAL [facility];  Service: Gastroenterology   ??? PR ERCP REMOVE FOREIGN BODY/STENT BILIARY/PANC DUCT N/A 06/04/2018    Procedure: ENDOSCOPIC RETROGRADE CHOLANGIOPANCREATOGRAPHY (ERCP); W/ REMOVAL OF FOREIGN BODY/STENT FROM BILIARY/PANCREATIC DUCT(S);  Surgeon: Chriss Driver, MD;  Location: GI PROCEDURES MEMORIAL [facility];  Service: Gastroenterology   ??? PR ERCP STENT PLACEMENT BILIARY/PANCREATIC DUCT N/A 11/21/2016    Procedure: ENDOSCOPIC RETROGRADE CHOLANGIOPANCREATOGRAPHY (ERCP); WITH PLACEMENT OF ENDOSCOPIC STENT INTO BILIARY OR PANCREATIC DUCT;  Surgeon: Vonda Antigua, MD;  Location: GI PROCEDURES MEMORIAL [facility];  Service: Gastroenterology   ??? PR ERCP,W/REMOVAL STONE,BIL/PANCR DUCTS N/A 08/20/2017    Procedure: ERCP; W/ENDOSCOPIC RETROGRADE REMOVAL OF CALCULUS/CALCULI FROM BILIARY &/OR PANCREATIC DUCTS;  Surgeon: Vonda Antigua, MD;  Location: GI PROCEDURES [facility];  Service: Gastroenterology   ???  PR ERCP,W/REMOVAL STONE,BIL/PANCR DUCTS N/A 06/04/2018    Procedure: ERCP; W/ENDOSCOPIC RETROGRADE REMOVAL OF CALCULUS/CALCULI FROM BILIARY &/OR PANCREATIC DUCTS;  Surgeon: Chriss Driver, MD;  Location: GI PROCEDURES MEMORIAL [facility];  Service: Gastroenterology   ??? PR TRANSPLANT LIVER,ALLOTRANSPLANT N/A 09/22/2016    Procedure: LIVER ALLOTRANSPLANTATION; ORTHOTOPIC, PARTIAL OR WHOLE, FROM CADAVER OR LIVING DONOR, ANY AGE;  Surgeon: Doyce Loose, MD;  Location: MAIN OR [facility];  Service: Transplant   ??? PR TRANSPLANT,PREP DONOR LIVER, WHOLE N/A 09/22/2016    Procedure: [facility] STD PREP CAD DONOR WHOLE LIVER GFT PRIOR TNSPLNT,INC CHOLE,DISS/REM SURR TISSU WO TRISEG/LOBE SPLT;  Surgeon: Doyce Loose, MD;  Location: MAIN OR [facility];  Service: Transplant   ??? PR UPGI ENDOSCOPY W/US FN BX N/A 08/20/2017    Procedure: UGI W/ TRANSENDOSCOPIC ULTRASOUND GUIDED INTRAMURAL/TRANSMURAL FINE NEEDLE ASPIRATION/BIOPSY(S), ESOPHAGUS;  Surgeon: Vonda Antigua, MD;  Location: GI PROCEDURES [facility]ORIAL [facility];  Service: Gastroenterology   ??? PR UPPER GI ENDOSCOPY,BIOPSY N/A 07/15/2016    Procedure: UGI ENDOSCOPY; WITH BIOPSY, SINGLE OR MULTIPLE;  Surgeon: Arnold Long Mir, MD;  Location: PEDS PROCEDURE ROOM [facility];  Service: Gastroenterology   ??? PR UPPER GI ENDOSCOPY,CTRL BLEED Left 05/05/2016    Procedure: UGI ENDOSCOPY; WITH CONTROL OF BLEEDING, ANY METHOD;  Surgeon: Arnold Long Mir, MD;  Location: PEDS PROCEDURE ROOM [facility];  Service: Gastroenterology   ??? PR UPPER GI ENDOSCOPY,CTRL BLEED N/A 05/12/2016    Procedure: UGI ENDOSCOPY; WITH CONTROL OF BLEEDING, ANY METHOD;  Surgeon: Annie Paras, MD;  Location: CHILDRENS OR [facility];  Service: Gastroenterology   ??? PR UPPER GI ENDOSCOPY,DIAGNOSIS N/A 09/21/2019    Procedure: UGI ENDO, INCLUDE ESOPHAGUS, STOMACH, & DUODENUM &/OR JEJUNUM; DX W/WO COLLECTION SPECIMN, BY BRUSH OR WASH;  Surgeon: Monte Fantasia, MD;  Location: GI PROCEDURES MEMORIAL [facility];  Service: Gastroenterology       Family History:   Family History   Problem Relation Age of Onset   ??? Diabetes Maternal Grandmother    ??? Diabetes Paternal Grandmother    ??? Hypertension Maternal Grandfather    ??? Hypertension Paternal Grandfather    ??? Asthma Brother    ??? Anesthesia problems Neg Hx    ??? Melanoma Neg Hx    ??? Basal cell carcinoma Neg Hx    ??? Squamous cell carcinoma Neg Hx        Social History: Lives with mother-[name] Gross and her husband-Michael and siblings: sister-Payton (53 y.o) and brother-Darion (63 y.o.) in [place].  [name] is a Engineer, civil (consulting) and Casimiro Needle is a Writer.    Allergies:  Bee pollen and Pollen extracts    Medications:  Scheduled Meds:  ??? [START ON 12/13/2019] Cefepime  50 mg/kg (Dosing Weight) Intravenous Q24H   ??? cholecalciferol (vitamin D3)  5,000 Units Oral Daily   ??? fluticasone propionate  1 puff Inhalation Q12H (RT)   ??? hydrocortisone sod succ  50 mg/m2/day (Dosing Weight) Intravenous Q6H   ??? magnesium oxide  400 mg Oral BID   ??? metronidazole  500 mg Intravenous [facility]   ??? multivitamin, with zinc  2 tablet Oral Daily   ??? pantoprazole (PROTONIX) intravenous solutio  40 mg Intravenous BID   ??? rifAXIMin  550 mg Oral BID   ??? spironolactone  50 mg Oral Daily   ??? valGANciclovir  450 mg Oral Daily     Continuous Infusions:  ??? heparin 3 mL/hr (12/12/19 1500)   ??? lactated Ringers 95 mL/hr (12/12/19 1500)   ??? norepinephrine bitartrate-NS Stopped (12/12/19 0816)  PRN Meds:.ondansetron    ROS: Negative except as noted in HPI and chart review.      Physical Exam:   Temp:  [36.1 ??C (97 ??F)-36.8 ??C (98.2 ??F)] 36.3 ??C (97.3 ??F)  Heart Rate:  [79-109] 82  SpO2 Pulse:  [79-108] 82  Resp:  [12-28] 20  SpO2:  [100 %] 100 %  BP: (71-96)/(38-58) 80/53  MAP (mmHg):  [49-68] 63  A BP-1: (93-94)/(51-52) 93/52  MAP:  [65 mmHg] 65 mmHg  A BP-2: (78-116)/(41-68) 97/45  MAP:  [55 mmHg-84 mmHg] 59 mmHg     Gen: thin adolescent male, lying in bed, NAD, occasionally interacts w Derrek Monaco, polite  HEENT: sclera icteric, [place]/AT  CV: RRR per monitor  Resp: normal WOB on RA  Neuro: awake/alert, oriented X 3, appropriate, clear speech, +tremors of hands/UE's    Data: Reviewed in Epic.    La Lindwood Qua, [place], CPNP  Children's Supportive Care Team  [phone] (pager)    Teaching Physician Attestation  I conducted the interview along with Jennette Dubin, DNP. I independently assessed the patient and was involved in counseling patient/family as documented above. I was involved with making the recommendations contained in this note and communicating these recommendations to the primary service.     Dan Humphreys, MD, MPH, FAAP  Clinical Assistant Professor  Pediatric Palliative Care  [facility]

## 2019-12-12 NOTE — Unmapped (Signed)
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 Overall clinical decline likely due to worsening hepatic dysfunction in the setting of chronic rejection, which has been determined to be related to medication nonadherence.    Seen in consultation at the request of Dr. Lorenza Burton, MD for cirrhosis    Interval History:  - Overnight, arterial line was placed  - BP decreased to 50s/30s, starting levophed.   - Hgb trend 7.7-->9.8-->8.2 (has only received 1 unit prbcs since being here, also got IVF though doesn't appear dilutional). Other labs notable for Cr 1.990, Tbili 30.8 (up from 22.3, D=28.3), AST down to 203 from 423, and ALT 142 down from 240, ALP 1205. INR 2.03.  - 10/2019 - deemed not a transplant candidate  - Remains on octreotide, IV PPI, Vanc/Cef, heparin       ASSESSMENT / PLAN:     #Concern for UGIB: No evidence of overt GI bleeding while inpatient, though did have one episode of coffee-ground emesis when at home.  Given stability of hemoglobin and no overt bleeding here, no frank hematemesis, lower concern for variceal bleed.  Possibilities would include oozing from portal hypertensive gastropathy, a Mallory-Weiss tear in the setting of multiple episodes of emesis, gave her angiectasia's in the setting of liver disease, gastritis and erosions.  Less likely variceal bleed less likely peptic ulcer disease bleeding.  -Keep on IV PPI twice daily  -Can stop octreotide  -Monitor closely for overt signs of bleeding, nursing should evaluate every time has bowel movement to assess color  -Antibiotics already on in the setting of hypotension, but also are providing prophylaxis in setting of GI bleed.  -2 large-bore IVs  -N.p.o. at midnight for upper endoscopy tomorrow, February 2 in GI procedures.  Will place enteral feeding tube at the same time.    #AKI: Suspect AKI could be related to hypovolemia in the setting of vomiting and poor p.o. intake, could be related to infection, though infectious work-up has been unrevealing.  -Agree with holding diuretics in the setting of hypotension  -Continue albumin 1g/kg to assess fluid responsiveness.  Agree with fluids per critical care team    #S/p OLT 2017, now w/chronic rejection: Last liver biopsy in October 2020 showed??ductopenia suggestive of chronic rejection, severe hepatocanalicular cholestasis, along with perivenular, periportal and??portal fibrosis.?? Overall clinical decline likely due to worsening hepatic dysfunction in the setting of chronic rejection, which has been determined to be related to medication nonadherence.  -Tacrolimus and CellCept have been held in the setting of renal dysfunction  -We will monitor for kidney improvement and assess when to restart, appreciate pediatric GI involvement.  -In December was deemed not appropriate transplant candidate at this time, will need further evaluation as outpatient.    MELD-Na score: 34 at 12/12/2019  3:32 AM  MELD score: 33 at 12/12/2019  3:32 AM  Calculated from:  Serum Creatinine: 1.90 mg/dL at 05/18/2955  2:13 AM  Serum Sodium: 133 mmol/L at  12/12/2019  3:32 AM  Total Bilirubin: 30.8 mg/dL at 11/15/1094  0:45 AM  INR(ratio): 2.03 at 12/12/2019  3:32 AM  Age: 59 years 8 months      Please page [phone] for further questions/concerns. Patient seen and discussed with Dr. Blanchie Serve, MD  Gastroenterology Fellow, PGY-4  [facility]           Medications:  Current Facility-Administered Medications   Medication Dose Route Frequency Provider Last Rate Last Admin   ??? cholecalciferol (vitamin D3) tablet 5,000 Units  5,000 Units Oral Daily Alecia Lemming, MD   5,000 Units at 12/11/19 0933   ??? fluticasone propionate (FLOVENT HFA) 220 mcg/actuation inhaler 1 puff  1 puff Inhalation Q12H (RT) Alecia Lemming, MD   1 puff at 12/11/19 1935   ??? heparin 1,000 units/500 mL (2 units/mL) in 0.9% NaCl infusion  3 mL/hr Intravenous Continuous Alecia Lemming, MD 3 mL/hr at 12/12/19 0500 3 mL/hr at 12/12/19 0500   ??? lactated ringers bolus 1,000 mL  1,000 mL Intravenous Once [name] Lamer, MD       ??? lactated Ringers infusion  95 mL/hr Intravenous Continuous Alecia Lemming, MD 95 mL/hr at 12/12/19 0500 95 mL/hr at 12/12/19 0500   ??? magnesium oxide (MAG-OX) tablet 400 mg  400 mg Oral BID Alecia Lemming, MD   400 mg at 12/11/19 2200   ??? metroNIDAZOLE (FLAGYL) IVPB 500 mg  500 mg Intravenous [facility] Elesa Hacker, MD   Stopped at 12/11/19 2303   ??? multivitamin, with zinc (AQUADEKS) chewable tablet  2 tablet Oral Daily Alecia Lemming, MD   2 tablet at 12/11/19 0940   ??? norepinephrine 8 mg in sodium chloride 0.9 % 250 mL (0.032 mg/mL) infusion  0-3 mcg/kg/min (Dosing Weight) Intravenous Continuous Elesa Hacker, MD       ??? octreotide 500 mcg in sodium chloride 0.9 % 100 mL (5 mcg/mL) infusion  50 mcg/hr Intravenous Continuous Ether Griffins, MD 10 mL/hr at 12/12/19 0500 50 mcg/hr at 12/12/19 0500   ??? ondansetron (ZOFRAN) injection 4 mg  4 mg Intravenous Q8H PRN Alecia Lemming, MD   4 mg at 12/12/19 0128   ??? pantoprazole (PROTONIX) injection 40 mg  40 mg Intravenous BID Alecia Lemming, MD   40 mg at 12/11/19 1749   ??? predniSONE (DELTASONE) tablet 15 mg  15 mg Oral Daily Alecia Lemming, MD   15 mg at 12/11/19 0941   ??? rifAXIMin (XIFAXAN) tablet 550 mg  550 mg Oral BID Alecia Lemming, MD   550 mg at 12/11/19 2200   ??? spironolactone (ALDACTONE) tablet 50 mg  50 mg Oral Daily Alecia Lemming, MD   50 mg at 12/11/19 0943   ??? [START ON 12/13/2019] valGANciclovir (VALCYTE) tablet 450 mg  450 mg Oral Q48H Elesa Hacker, MD           Vital Signs:  Temp:  [36.5 ??C-37 ??C] 36.8 ??C  Heart Rate:  [81-119] 85  SpO2 Pulse:  [81-167] 88  Resp:  [12-37] 18  BP: (80-90)/(34-58) 88/58  MAP (mmHg):  [49-68] 68  A BP-1: (93-94)/(51-52) 93/52  MAP:  [65 mmHg] 65 mmHg  A BP-2: (83-116)/(41-68) 87/44  MAP:  [55 mmHg-84 mmHg] 57 mmHg  FiO2 (%):  [21 %] 21 %  SpO2:  [100 %] 100 %    Intake/Output last 3 shifts:  I/O last 3 completed  shifts:  In: 4296.7 [I.V.:2549.7; Blood:275; IV Piggyback:1472]  Out: 825 [Urine:825]    Physical Exam:  General appearance: Laying in bed, sheet pulled overhead, but awake and interactive  HEENT: Icteric sclera  Cardiovascular: Regular rate  Pulmonary: Normal work of breathing. Acyanotic  Abdominal: soft, mildly tender throughout, mildly distended, no hepatomegaly  Musculoskeletal: + temporal wasting. Normal joints of the hand.  Skin: + jaundice. No rashes.  Neurologic: Alert, oriented, and appropriate. Bilateral hand tremor  Psychiatric: Anxious/irritable.    Diagnostic Studies:   Labs:  Recent Labs     12/11/19  0155 12/11/19  1609 12/12/19  0332   WBC 10.8 10.1 8.9   HGB 7.7* 9.8* 8.2*   HCT 23.1* 28.7* 23.6*   PLT 95* 107* 124*     Recent Labs     12/11/19  0137 12/11/19  1119 12/11/19  1609 12/12/19  0332   NA 130* - 132* 133* 128* 133*   K 3.0* - 2.6* 2.8* 2.9* 3.5   CL 108*  --   --  109*   BUN 18  --   --  18   CREATININE 1.92*  --   --  1.90*   GLU 112  --   --  147*     Recent Labs     12/11/19  0137 12/12/19  0332   PROT 4.1* 4.6* - 4.6*   ALBUMIN 1.8* 2.4* - 2.4*   AST 423* 203* - 203*   ALT 240* 142* - 142*   ALKPHOS 1,205* 1,054* - 1,054*   BILITOT 22.3* 30.8* - 30.8*     Recent Labs     12/12/19  0332   INR 2.03   APTT 44.7*   FIBRINOGEN 163*       Imaging/Procedures:    US Liver doppler 12/11/19  IMPRESSION:  - Patent hepatic transplant vasculature.  - Interval increase in resistive indices noted throughout the hepatic arteries which are within upper limits of normal/minimally elevated, as above.  - Increased diameter common bile duct. No stone identified.  - Heterogeneous liver echotexture, which is somewhat nonspecific, however, can be seen in setting of underlying liver disease. Slight interval decrease in liver measurement with increased size spleen.  - Large-volume abdominopelvic ascites, similar to prior.  - Bilateral echogenic kidneys, nonspecific, however, can be seen with chronic medical renal disease.

## 2019-12-12 NOTE — Unmapped (Signed)
Pt remains in PICU, RA, Aline placed, pericentisis completed, lab results pending. Anticipate placement of PICC this evening. Pt and Mom updated throughout the day re: POC and all questions answered. Will cont to monitor and support

## 2019-12-12 NOTE — Unmapped (Signed)
Pediatric Vancomycin Therapeutic Monitoring Pharmacy Note    [name] Gross is a 19 y.o. male continuing vancomycin. Date of therapy initiation: 12/11/19     Indication: Bacteremia/Sepsis and Suspected infection     Prior Dosing Information: Dose by levels (last dose: vancomycin 1000 mg IV x 1 dose on 1/31 @0325 )      Dosing Weight: 52.9 kg    Goals:  Therapeutic Drug Levels  Vancomycin trough goal: 10-20 mg/L    Additional Clinical Monitoring/Outcomes  Renal function, volume status (intake and output)    Results: Vancomycin random level: 14.2 mg/L, drawn ~13 hours after last dose    Wt Readings from Last 1 Encounters:   12/11/19 52.9 kg (116 lb 10 oz) (2 %, Z= -2.03)*     * Growth percentiles are based on CDC (Boys, 2-20 Years) data.     Creatinine   Date Value Ref Range Status   12/11/2019 1.92 (H) 0.70 - 1.30 mg/dL Final   16/08/9603 5.40 (H) mg/dL Final   98/09/9146 8.29 0.70 - 1.30 mg/dL Final        UOP: Anuric, none documented at this point; Scr 2.69 @ OSH -> 1.92    Pharmacokinetic Considerations and Significant Drug Interactions:  ? Dosed per pediatric guideline  ? Concurrent nephrotoxic meds: spironolactone   o Holding home tacro until renal function improves    Assessment/Plan:  - Level is within therapeutic range 10-20 mg/L  Recommendation(s)  ? Will re-dose 1 x vancomycin 1000 mg (~19 mg/kg) IV now and continue to check levels prior to re-dosing given renal function    Follow-up  ? Next level ordered: 12 hrs post dose  ? A pharmacist will continue to monitor and order levels as appropriate    Please page service pharmacist with questions/clarifications.    Emmit Alexanders, PharmD  PGY2 Pediatric Pharmacy Resident  [phone] (phone)  [phone] (pager)

## 2019-12-12 NOTE — Unmapped (Signed)
[facility]  Initial Psychiatry Consult Note     Service Date: December 12, 2019  LOS:  LOS: 1 day      Assessment:   [name] Gross is a 19 y.o. 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 hepatorenal syndrome (HRS) with possible GI bleed from known esophageal varices..  Patient was seen in consultation by Psychiatry at the request of Lorenza Burton, MD with Pediatric ICU (PMS) for evaluation of Depression.     The patient's current presentation of depressed mood, poor sleep, poor appetite, is most consistent with known diagnosis of Major Depressive Disorder.  Patient has had poor medication compliance historically, and most recently was discharged from pediatrics service in November on effexor -- though pt has not been taking effexor since discharge from hospital as mom reports they were told effexor could worsen pts baseline tremor so they discontinued it. Discussed other prior medications. Patient and mom would like to start standing antidepressant again, and are agreeable to retrying lexapro. Given potential for hyponatremia, increased risk for bleeds, and drug-drug interactions with lexapro -- will wait until patient is more medically stable. Agree with psychology team assessing patient.    Please see below for detailed recommendations.    Diagnoses:   Active Hospital problems:  Principal Problem:    AKI (acute kidney injury) (CMS-HCC)  Active Problems:    History of liver transplant (CMS-HCC)    Major depressive disorder    Liver transplant rejection (CMS-HCC)       Problems edited/added by me:  Problem   Major Depressive Disorder       Safety Risk Assessment:  A suicide and violence risk assessment was performed as part of this evaluation. Risk factors for self-harm/suicide: history of suicide attempts, chronic medical illness.  Protective factors against self-harm/suicide:  lack of active SI, motivation for treatment and supportive family.  Risk factors for harm to others: chronic impulsivity.  Protective factors against harm to others: no active symptoms of psychosis, no active symptoms of mania and connectedness to family.  While future psychiatric events cannot be accurately predicted, the patient is not currently at elevated acute risk, and is not at elevated chronic risk of harm to self and is not currently at elevated acute risk, and is not at elevated chronic risk of harm to others.       Recommendations:   ## Safety:   -- No acute safety concerns.  Please see safety assessment for further discussion.    ## Medications:   -- START hydroxyzine 25 mg PO q6h PRN anxiety, sleep  -- START Thiamine IV 200 mg tid x5 days, then transition to PO/NGT 100 mg daily.  IV formulation chosen given poor nutritional status.  -- START Folate supplementation and multivitamin daily.  -- START Melatonin 3 mg q1800 for sleep and circadian rhythm regulation.   -- Agree with vitamin D supplementation    ## Medical Decision Making Capacity:   -- A formal capacity assessement  was not performed as a part of this evaluation.  If specific capacity questions arise, please contact our team as below.     ## Further Work-up:   -- Please obtain EKG  -- While the patient is receiving medications (such as hydroxyzine) that may prolong QTc and increase risk for torsades:     - MONITOR and KEEP Mg>2 and K>4      - MONITOR QTc regularly.  If QTc on tele strip >413ms, obtain 12-lead EKG.    ## Disposition:   -- There are no psychiatric contraindications to discharging this patient when medically appropriate.   -- Agree with psychology speaking with patient     ## Behavioral / Environmental:   -- Although not currently delirious, the patient is at an elevated risk for developing delirium. Please utilize delirium prevention protocol.  -- Please order Delirium (prevention) protocol: the following can be copied into a single misc nursing order.        - RN to open blinds every morning.        - To bedside: glasses, hearing aide, patient's own shoes. Make available to patient's when possible and encourage use.        - RN to assess orientation (person, place, & time) qam and prn, with frequent reorientation (verbal & whiteboard) & introduction of caregivers. ??         - Recommend extended visiting hours with familiar family/friends as feasible.        - Encourage normal sleep-wake cycle by promoting a dark, quiet environment at night and stimulating, light environment during the day. ??        - Turn the TV off when patient is asleep or not in use.    Thank you for this consult request. Recommendations have been communicated to the primary team.  We will follow as needed at this time. Please page [phone] or [phone] (after hours)  for any questions or concerns.     This patient was evaluated in person.    Discussed with and seen by Attending, Torrie Mayers, MD, who agrees with the assessment and plan.    Sloan Leiter, MD  I saw and evaluated the patient, participating in the key portions of the service.  I reviewed and edited the resident???s note.  I agree with the resident???s findings and plan. Will hold off on starting lexapro until pt is more medically stable.     Elray Buba, MD        History:   Relevant Aspects of Hospital Course:   [name] Gross is a 19 y.o. male with a history of??cryptogenic cirrhosis??s/p liver transplant in 2017 with non-compliance and non-adherence to anti-rejection medication with multiple episodes of??acute on chronic rejection leading to graft loss, with course complicated by MSSA and strep mitis bacteremia, right upper extremity DVT, G1 esophageal varices, ascites and limb swelling, and diarrhea, who presents as a transfer from [facility] where he initially presented with coffee ground emesis and decreased PO intake x2 weeks, found to have oliguria with an AKI (Cr 2.7), concerning for pre-renal AKI due to dehydration vs. hepatorenal syndrome (HRS) with possible GI bleed from known esophageal varices. Pt afebrile without abd tenderness but WBC 15, thus spontaneous bacterial peritonitis is also on the differential. Ammonia level wnl and mentating appropriately thus less concern for hepatic encephalopathy, but will monitor closely for AMS. Pediatric GI was consulted upon admission. Plan to obtain liver transplant ultrasound, labs to evaluate for HRS, IVFs for resuscitation,  IV protonix for possible bleed, and broad spectrum antibiotics pending culture results. Per GI, will hold nephrotoxic medications for overnight given AKI, and discuss restarting his immunosuppressive agents with Dr. Gean Quint (primary Peds GI) in the AM.    Patient Report:   Patient is feeling not good. States he is feeling more depressed lately and has had a hard time sleeping. Says he is frustrated that he is back in the hospital, and is tired of always waiting. Says that in the context of this, his mood has gotten worse. Today, feels safe in the hospital, denies SI. Appetite is poor, but patient is not allowed to eat right now. He says that he did not take effexor at time of discharge, cannot remember why. Mom, who is in the room, states that they were told that it worsened tremor which is why they discontinued it. Patient says he would like to be on an antidepressant, and mom says that it could be helpful for the patient. They are also planning on talking to psychologist while in the hospital, Artemio Aly. Patient is looking forward to this. Oriented x 4, and is able to do MOYB (skips April).     Talk about prior medication trials-- says that he cannot remember if he noticed a chance from lexapro because he was not compliant with this medication. He and mom are agreeable to trying this again.     Notably, patient is very jaundiced on exam, and has bilateral upper extremity tremor.     ROS:   All systems reviewed as negative/unremarkable aside from the following pertinent positives and negatives: + difficulties with sleeping, + pain    Collateral information:   - Reviewed medical records in Epic  - Spoke to patient's mom, who was present in the room: patient's mood has been worse lately, in the context of worsening medical condition. Thinks he could benefit from being back on lexapro, could also benefit from medication to help him sleep at night. She states that he was not continued on effexor because someone told her that it would worsen his baseline tremor    Psychiatric History:   Prior psychiatric diagnoses: anxiety, depression  Psychiatric hospitalizations: [facility] 2019  Suicide attempts / Non-suicidal self-injury: two previous, by intentional overdose  Medication trials/compliance: Lexapro, Remeron, melatonin, Prozac, Effexor (poor compliance with all of these)    Medical History:  Past Medical History:   Diagnosis Date   ??? 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 Depression    ??? MSSA bacteremia 08/27/2019   ??? Seasonal allergies    ??? Sickle cell trait (CMS-HCC)    ??? Strep Mitis Central line-associated bloodstream infection 08/27/2019       Surgical History:  Past Surgical History:   Procedure Laterality Date   ??? FRENULECTOMY, LINGUAL     ??? PR ERCP REMOVE FOREIGN BODY/STENT BILIARY/PANC DUCT N/A 08/20/2017    Procedure: ENDOSCOPIC RETROGRADE CHOLANGIOPANCREATOGRAPHY (ERCP); W/ REMOVAL OF FOREIGN BODY/STENT FROM BILIARY/PANCREATIC DUCT(S);  Surgeon: Vonda Antigua, MD;  Location: GI PROCEDURES MEMORIAL [facility];  Service: Gastroenterology   ??? PR ERCP REMOVE FOREIGN BODY/STENT BILIARY/PANC DUCT N/A 06/04/2018    Procedure: ENDOSCOPIC RETROGRADE CHOLANGIOPANCREATOGRAPHY (ERCP); W/ REMOVAL OF FOREIGN BODY/STENT FROM BILIARY/PANCREATIC DUCT(S);  Surgeon: Chriss Driver, MD;  Location: GI PROCEDURES MEMORIAL [name];  Service: Gastroenterology   ??? PR  ERCP STENT PLACEMENT BILIARY/PANCREATIC DUCT N/A 11/21/2016    Procedure: ENDOSCOPIC RETROGRADE CHOLANGIOPANCREATOGRAPHY (ERCP); WITH PLACEMENT OF ENDOSCOPIC STENT INTO BILIARY OR PANCREATIC DUCT;  Surgeon: Vonda Antigua, MD;  Location: GI PROCEDURES MEMORIAL [facility];  Service: Gastroenterology   ??? PR ERCP,W/REMOVAL STONE,BIL/PANCR DUCTS N/A 08/20/2017    Procedure: ERCP; W/ENDOSCOPIC RETROGRADE REMOVAL OF CALCULUS/CALCULI FROM BILIARY &/OR PANCREATIC DUCTS;  Surgeon: Vonda Antigua, MD;  Location: GI PROCEDURES MEMORIAL [facility];  Service: Gastroenterology   ??? PR ERCP,W/REMOVAL STONE,BIL/PANCR DUCTS N/A 06/04/2018    Procedure: ERCP; W/ENDOSCOPIC RETROGRADE REMOVAL OF CALCULUS/CALCULI FROM BILIARY &/OR PANCREATIC DUCTS;  Surgeon: Chriss Driver, MD;  Location: GI PROCEDURES [facility];  Service: Gastroenterology   ??? PR TRANSPLANT LIVER,ALLOTRANSPLANT N/A 09/22/2016    Procedure: LIVER ALLOTRANSPLANTATION; ORTHOTOPIC, PARTIAL OR WHOLE, FROM CADAVER OR LIVING DONOR, ANY AGE;  Surgeon: Doyce Loose, MD;  Location: MAIN OR [facility];  Service: Transplant   ??? PR TRANSPLANT,PREP DONOR LIVER, WHOLE N/A 09/22/2016    Procedure: [facility] STD PREP CAD DONOR WHOLE LIVER GFT PRIOR TNSPLNT,INC CHOLE,DISS/REM SURR TISSU WO TRISEG/LOBE SPLT;  Surgeon: Doyce Loose, MD;  Location: MAIN OR [facility];  Service: Transplant   ??? PR UPGI ENDOSCOPY W/US FN BX N/A 08/20/2017    Procedure: UGI W/ TRANSENDOSCOPIC ULTRASOUND GUIDED INTRAMURAL/TRANSMURAL FINE NEEDLE ASPIRATION/BIOPSY(S), ESOPHAGUS;  Surgeon: Vonda Antigua, MD;  Location: GI PROCEDURES [facility];  Service: Gastroenterology   ??? PR UPPER GI ENDOSCOPY,BIOPSY N/A 07/15/2016    Procedure: UGI ENDOSCOPY; WITH BIOPSY, SINGLE OR MULTIPLE;  Surgeon: Arnold Long Mir, MD;  Location: PEDS PROCEDURE ROOM [facility];  Service: Gastroenterology   ??? PR UPPER GI ENDOSCOPY,CTRL BLEED Left 05/05/2016    Procedure: UGI ENDOSCOPY; WITH CONTROL OF BLEEDING, ANY METHOD;  Surgeon: Arnold Long Mir, MD;  Location: PEDS PROCEDURE ROOM [facility];  Service: Gastroenterology   ??? PR UPPER GI ENDOSCOPY,CTRL BLEED N/A 05/12/2016    Procedure: UGI ENDOSCOPY; WITH CONTROL OF BLEEDING, ANY METHOD;  Surgeon: Annie Paras, MD;  Location: CHILDRENS OR [facility];  Service: Gastroenterology   ??? PR UPPER GI ENDOSCOPY,DIAGNOSIS N/A 09/21/2019    Procedure: UGI ENDO, INCLUDE ESOPHAGUS, STOMACH, & DUODENUM &/OR JEJUNUM; DX W/WO COLLECTION SPECIMN, BY BRUSH OR WASH;  Surgeon: Ozella Almond Long, MD;  Location: GI PROCEDURES [facility];  Service: Gastroenterology       Medications:     Current Facility-Administered Medications:   ???  [START ON 12/13/2019] cefepime (MAXIPIME) 2,000 mg in sodium chloride 0.9 % (NS) 100 mL IVPB-connector bag, 50 mg/kg (Dosing Weight), Intravenous, Q24H, Ether Griffins, MD  ???  cholecalciferol (vitamin D3) tablet 5,000 Units, 5,000 Units, Oral, Daily, Alecia Lemming, MD, 5,000 Units at 12/12/19 0842  ???  fluticasone propionate (FLOVENT HFA) 220 mcg/actuation inhaler 1 puff, 1 puff, Inhalation, Q12H (RT), Alecia Lemming, MD, 1 puff at 12/12/19 1610  ???  heparin 1,000 units/500 mL (2 units/mL) in 0.9% NaCl infusion, 3 mL/hr, Intravenous, Continuous, Alecia Lemming, MD, Last Rate: 3 mL/hr at 12/12/19 1300, 3 mL/hr at 12/12/19 1300  ???  hydrocortisone sod succ (Solu-CORTEF) injection 20 mg, 50 mg/m2/day (Dosing Weight), Intravenous, Q6H, Ether Griffins, MD, 20 mg at 12/12/19 1149  ???  lactated Ringers infusion, 95 mL/hr, Intravenous, Continuous, Alecia Lemming, MD, Last Rate: 95 mL/hr at 12/12/19 1340, 95 mL/hr at 12/12/19 1340  ???  magnesium oxide (MAG-OX) tablet 400 mg, 400 mg, Oral, BID, Alecia Lemming, MD, 400 mg at 12/12/19 0820  ???  metroNIDAZOLE (FLAGYL) IVPB 500  mg, 500 mg, Intravenous, Q8H SCH, Elesa Hacker, MD, Last Rate: 100 mL/hr at 12/12/19 1333, 500 mg at 12/12/19 1333  ???  multivitamin, with zinc (AQUADEKS) chewable tablet, 2 tablet, Oral, Daily, Alecia Lemming, MD, 2 tablet at 12/12/19 0272  ???  norepinephrine 8 mg in sodium chloride 0.9 % 250 mL (0.032 mg/mL) infusion, 0-3 mcg/kg/min (Dosing Weight), Intravenous, Continuous, Elesa Hacker, MD, Stopped at 12/12/19 0816  ???  ondansetron [facility]) injection 4 mg, 4 mg, Intravenous, Q8H PRN, Alecia Lemming, MD, 4 mg at 12/12/19 0128  ???  pantoprazole (PROTONIX) injection 40 mg, 40 mg, Intravenous, BID, Alecia Lemming, MD, 40 mg at 12/12/19 0620  ???  rifAXIMin (XIFAXAN) tablet 550 mg, 550 mg, Oral, BID, Alecia Lemming, MD, 550 mg at 12/12/19 5366  ???  spironolactone (ALDACTONE) tablet 50 mg, 50 mg, Oral, Daily, Alecia Lemming, MD, Stopped at 12/12/19 0820  ???  valGANciclovir (VALCYTE) tablet 450 mg, 450 mg, Oral, Daily, Lorenza Burton, MD, 450 mg at 12/12/19 1149    Allergies:  Allergies   Allergen Reactions   ??? Bee Pollen Rash     Pt has seasonal allergies that cause excessive sneezing and running nose- [name], NAII   ??? Pollen Extracts Rash     Pt has seasonal allergies that cause excessive sneezing and running nose- [name]       Social History:   No alcohol/drug/tobacco use. Lives with mom.     Family History: Reviewed and updated;   The patient's family history includes Asthma in his brother; Diabetes in his maternal grandmother and paternal grandmother; Hypertension in his maternal grandfather and paternal grandfather.      Objective:   Vital signs:   Temp:  [36.1 ??C-36.8 ??C] 36.3 ??C Heart Rate:  [79-109] 85  SpO2 Pulse:  [79-108] 85  Resp:  [12-28] 18  BP: (80-96)/(40-58) 82/42  MAP (mmHg):  [51-68] 55  A BP-1: (93-94)/(51-52) 93/52  MAP:  [65 mmHg] 65 mmHg  A BP-2: (78-116)/(41-68) 91/48  MAP:  [55 mmHg-84 mmHg] 61 mmHg  FiO2 (%):  [21 %] 21 %  SpO2:  [100 %] 100 %    Physical Exam:  Gen: No acute distress.  Pulm: Normal work of breathing.  Neuro/MSK: Normal bulk/tone. Gait/station deferred given acutely ill.  Skin: jaundiced skin and eyes     Mental Status Exam:  Appearance:  appears stated age and ill-appearing   Attitude:   calm and cooperative   Behavior/Psychomotor:  appropriate eye contact and tremors of the upper extremities   Speech/Language:   normal rate, volume, tone, fluency   Mood:  not good   Affect:  flat, guarded and mood congruent   Thought process:  logical, linear, clear, coherent, goal directed   Thought content:    denies thoughts of self-harm. Denies SI, plans, or intent. Denies HI.  No grandiose, self-referential, persecutory, or paranoid delusions noted.   Perceptual disturbances:   denies auditory and visual hallucinations   Attention:  able to fully attend without fluctuations in consciousness, does MOYB correctly (skips April)   Concentration:  Able to fully concentrate and attend   Orientation:  Oriented to person, place, city, date and situation.   Memory:  not formally tested, but grossly intact   Fund of knowledge:   not formally assessed   Insight:    Impaired   Judgment:   Limited   Impulse Control:  Limited       Data Reviewed:  I reviewed labs from the last 24 hours.  I reviewed imaging reports from the last 24 hours.     Additional Psychometric Testing:  Not applicable.

## 2019-12-12 NOTE — Unmapped (Signed)
Inpatient Follow Up Consult Note    Requesting Attending Physician :  Lorenza Burton, MD  Service Requesting Consult : Pediatric ICU (PMS)    Assessment/Plan:    Principal Problem:    AKI (acute kidney injury) (CMS-HCC)  Active Problems:    History of liver transplant (CMS-HCC)    Liver transplant rejection (CMS-HCC)  Resolved Problems:    * No resolved hospital problems. *            [name] Gross is a 19 y.o. y/o male that presents to [facility] with AKI (acute kidney injury) (CMS-HCC).  Pt was seen at the request of Lorenza Burton, MD (Pediatric ICU (PMS)) in consultation for ascites    * S/p dx paracentesis  - fluid indices not c/w SBP  - No apparent complications  - Will sign off; please page [phone] for new procedure needs      ___________________________________________________________________    Subjective:    No acute problems.  No pain or leakage at paracentesis site.    Objective:    Vital signs in last 24 hours:  Temp:  [36.5 ??C (97.7 ??F)-37 ??C (98.6 ??F)] 36.8 ??C (98.2 ??F)  Heart Rate:  [81-119] 95  SpO2 Pulse:  [81-167] 89  Resp:  [12-37] 16  BP: (80-96)/(34-58) 96/55  MAP (mmHg):  [49-68] 68  A BP-1: (93-94)/(51-52) 93/52  MAP:  [65 mmHg] 65 mmHg  A BP-2: (78-116)/(41-68) 100/53  MAP:  [55 mmHg-84 mmHg] 66 mmHg  FiO2 (%):  [21 %] 21 %  SpO2:  [100 %] 100 %    Intake/Output last 3 shifts:  I/O last 3 completed shifts:  In: 5512.7 [I.V.:2765.7; Blood:275; IV Piggyback:2472]  Out: 1625 [Urine:1425; Stool:200]    Physical Exam:  Gen alert, no distress  Abd soft, NT.  RLQ paracentesis site without bleeding or bruising    Test Results  Data Review:    All lab results last 24 hours:    Recent Results (from the past 24 hour(s))   Blood Gas Critical Care Panel, Venous    Collection Time: 12/11/19 11:19 AM   Result Value Ref Range    Specimen Source Venous     FIO2 Venous Room Air     pH, Venous 7.38 7.32 - 7.43    pCO2, Ven 29 (L) 40 - 60 mm Hg    pO2, Ven 38 30 - 55 mm Hg    HCO3, Ven 17 (L) 22 - 27 mmol/L Base Excess, Ven -7.6 (L) -2.0 - 2.0    O2 Saturation, Venous 76.2 40.0 - 85.0 %    Sodium Whole Blood 133 (L) 135 - 145 mmol/L    Potassium, Bld 2.8 (L) 3.4 - 4.6 mmol/L    Calcium, Ionized Venous 4.72 4.40 - 5.40 mg/dL    Glucose Whole Blood 112 70 - 179 mg/dL    Lactate, Venous 1.5 0.5 - 1.8 mmol/L    Hgb, blood gas 6.80 (L) 13.50 - 17.50 g/dL   Albumin Level, Body Fluid    Collection Time: 12/11/19 11:50 AM   Result Value Ref Range    Albumin, Fluid <1.0 g/dL    Albumin, Fluid Type Fluid, Ascitic    Protein, Body Fluid    Collection Time: 12/11/19 11:50 AM   Result Value Ref Range    Protein, Fluid <2.0 g/dL    Protein, Fluid Type Fluid, Ascitic    Triglycerides, Body Fluid    Collection Time: 12/11/19 11:50 AM   Result Value Ref Range  Triglycerides, Fluid 15 mg/dL    Triglycerides, Fluid Type Fluid, Ascitic    Body fluid cell count    Collection Time: 12/11/19 11:51 AM   Result Value Ref Range    Fluid Type Fluid, Ascitic     Color, Fluid Yellow     Appearance, Fluid Clear     Nucleated Cells, Fluid 56 Undefined ul    RBC, Fluid 27 ul    Neutrophil %, Fluid 12.0 %    Lymphocytes %, Fluid 7.0 %    Mono/Macro % , Fluid 81.0 %    #Cells Counted BF Diff 100     Fluid Comments Macrophages present.      Prepare RBC    Collection Time: 12/11/19 12:04 PM   Result Value Ref Range    Crossmatch Compatible     Unit Blood Type B Pos     ISBT Number 7300     Unit # [id number]     Status Issued     Spec Expiration [id number]     Product ID Red Blood Cells     PRODUCT CODE [id number]    CBC w/ Differential    Collection Time: 12/11/19  4:09 PM   Result Value Ref Range    WBC 10.1 4.5 - 11.0 10*9/L    RBC 3.32 (L) 4.50 - 5.90 10*12/L    HGB 9.8 (L) 13.5 - 17.5 g/dL    HCT 08.6 (L) 57.8 - 53.0 %    MCV 86.4 80.0 - 100.0 fL    MCH 29.4 26.0 - 34.0 pg    MCHC 34.0 31.0 - 37.0 g/dL    RDW 46.9 (H) 62.9 - 15.0 %    MPV      Platelet 107 (L) 150 - 440 10*9/L    Variable HGB Concentration Marked (A) Not Present    Neutrophils % 90.4 %    Lymphocytes % 4.1 %    Monocytes % 3.7 %    Eosinophils % 0.2 %    Basophils % 1.0 %    Neutrophil Left Shift 3+ (A) Not Present    Absolute Neutrophils 9.2 (H) 2.0 - 7.5 10*9/L    Absolute Lymphocytes 0.4 (L) 1.5 - 5.0 10*9/L    Absolute Monocytes 0.4 0.2 - 0.8 10*9/L    Absolute Eosinophils 0.0 0.0 - 0.4 10*9/L    Absolute Basophils 0.1 0.0 - 0.1 10*9/L    Large Unstained Cells 1 0 - 4 %    Microcytosis Moderate (A) Not Present    Macrocytosis Slight (A) Not Present    Anisocytosis Marked (A) Not Present    Hyperchromasia Marked (A) Not Present   Vancomycin, Trough    Collection Time: 12/11/19  4:09 PM   Result Value Ref Range    Vancomycin Tr 14.2 10.0 - 20.0 ug/mL   Blood Gas Critical Care Panel, Venous    Collection Time: 12/11/19  4:09 PM   Result Value Ref Range    Specimen Source Venous     FIO2 Venous Room Air     pH, Venous 7.32 7.32 - 7.43    pCO2, Ven 32 (L) 40 - 60 mm Hg    pO2, Ven 42 30 - 55 mm Hg    HCO3, Ven 16 (L) 22 - 27 mmol/L    Base Excess, Ven -9.3 (L) -2.0 - 2.0    O2 Saturation, Venous 75.3 40.0 - 85.0 %    Sodium Whole Blood 128 (L) 135 - 145 mmol/L  Potassium, Bld 2.9 (L) 3.4 - 4.6 mmol/L    Calcium, Ionized Venous 4.52 4.40 - 5.40 mg/dL    Glucose Whole Blood 112 70 - 179 mg/dL    Lactate, Venous 1.3 0.5 - 1.8 mmol/L    Hgb, blood gas 9.10 (L) 13.50 - 17.50 g/dL   Morphology Review    Collection Time: 12/11/19  4:09 PM   Result Value Ref Range    Smear Review Comments See Comment (A) Undefined    Target Cells Moderate (A) Not Present    Schistocytes Occasional (AA) Not Present    Burr Cells Present (A) Not Present    Acanthocytes Present (A) Not Present    Pappenheimer Bodies Present (A) Not Present    Poikilocytosis Marked (A) Not Present   Sodium, Random Urine    Collection Time: 12/12/19  2:16 AM   Result Value Ref Range    Sodium, Ur 59 Undefined mmol/L   Urinalysis    Collection Time: 12/12/19  2:16 AM   Result Value Ref Range    Color, UA Amber     Clarity, UA Hazy Specific Gravity, UA 1.008 1.003 - 1.030    pH, UA 6.0 5.0 - 9.0    Leukocyte Esterase, UA Negative Negative    Nitrite, UA Negative Negative    Protein, UA Negative Negative    Glucose, UA Negative Negative    Ketones, UA Trace (A) Negative    Urobilinogen, UA 4.0 mg/dL (A) 0.2 mg/dL, 1.0 mg/dL    Bilirubin, UA Large (A) Negative    Blood, UA Negative Negative    RBC, UA <1 <=3 /HPF    WBC, UA 1 <=2 /HPF    Squam Epithel, UA <1 0 - 5 /HPF    Bacteria, UA Moderate (A) None Seen /HPF    Hyaline Casts, UA 4 (H) 0 - 1 /LPF    Mucus, UA Rare (A) None Seen /HPF   Comprehensive Metabolic Panel    Collection Time: 12/12/19  3:32 AM   Result Value Ref Range    Sodium 133 (L) 135 - 145 mmol/L    Potassium 3.5 3.5 - 5.0 mmol/L    Chloride 109 (H) 98 - 107 mmol/L    Anion Gap 10 7 - 15 mmol/L    CO2 14.0 (L) 22.0 - 30.0 mmol/L    BUN 18 7 - 21 mg/dL    Creatinine 1.61 (H) 0.70 - 1.30 mg/dL    BUN/Creatinine Ratio 9     EGFR CKD-EPI Non-African American, Male 50 (L) >=60 mL/min/1.13m2    EGFR CKD-EPI African American, Male 58 (L) >=60 mL/min/1.33m2    Glucose 147 (H) 70 - 99 mg/dL    Calcium 8.1 (L) 8.5 - 10.2 mg/dL    Albumin 2.4 (L) 3.5 - 5.0 g/dL    Total Protein 4.6 (L) 6.5 - 8.3 g/dL    Total Bilirubin 09.6 (H) 0.0 - 1.2 mg/dL    AST 045 (H) 19 - 55 U/L    ALT 142 (H) <50 U/L    Alkaline Phosphatase 1,054 (H) 65 - 260 U/L   Magnesium Level    Collection Time: 12/12/19  3:32 AM   Result Value Ref Range    Magnesium 1.8 1.6 - 2.2 mg/dL   Phosphorus Level    Collection Time: 12/12/19  3:32 AM   Result Value Ref Range    Phosphorus 4.3 2.9 - 4.7 mg/dL   Hepatic Function Panel    Collection Time: 12/12/19  3:32 AM  Result Value Ref Range    Albumin 2.4 (L) 3.5 - 5.0 g/dL    Total Protein 4.6 (L) 6.5 - 8.3 g/dL    Total Bilirubin 16.1 (H) 0.0 - 1.2 mg/dL    Bilirubin, Direct 09.60 (H) 0.00 - 0.40 mg/dL    AST 454 (H) 19 - 55 U/L    ALT 142 (H) <50 U/L    Alkaline Phosphatase 1,054 (H) 65 - 260 U/L   CBC w/ Differential Collection Time: 12/12/19  3:32 AM   Result Value Ref Range    WBC 8.9 4.5 - 11.0 10*9/L    RBC 2.73 (L) 4.50 - 5.90 10*12/L    HGB 8.2 (L) 13.5 - 17.5 g/dL    HCT 09.8 (L) 11.9 - 53.0 %    MCV 86.6 80.0 - 100.0 fL    MCH 30.1 26.0 - 34.0 pg    MCHC 34.8 31.0 - 37.0 g/dL    RDW 14.7 (H) 82.9 - 15.0 %    MPV 7.8 7.0 - 10.0 fL    Platelet 124 (L) 150 - 440 10*9/L    Variable HGB Concentration Marked (A) Not Present    Neutrophils % 87.8 %    Lymphocytes % 5.0 %    Monocytes % 5.8 %    Eosinophils % 0.1 %    Basophils % 0.7 %    Neutrophil Left Shift 3+ (A) Not Present    Absolute Neutrophils 7.8 (H) 2.0 - 7.5 10*9/L    Absolute Lymphocytes 0.4 (L) 1.5 - 5.0 10*9/L    Absolute Monocytes 0.5 0.2 - 0.8 10*9/L    Absolute Eosinophils 0.0 0.0 - 0.4 10*9/L    Absolute Basophils 0.1 0.0 - 0.1 10*9/L    Large Unstained Cells 1 0 - 4 %    Microcytosis Moderate (A) Not Present    Macrocytosis Slight (A) Not Present    Anisocytosis Marked (A) Not Present    Hyperchromasia Marked (A) Not Present    Hypochromasia Slight (A) Not Present   aPTT    Collection Time: 12/12/19  3:32 AM   Result Value Ref Range    APTT 44.7 (H) 25.3 - 37.1 sec    Heparin Correlation 0.3    PT-INR    Collection Time: 12/12/19  3:32 AM   Result Value Ref Range    PT 23.4 (H) 10.5 - 13.5 sec    INR 2.03    Fibrinogen    Collection Time: 12/12/19  3:32 AM   Result Value Ref Range    Fibrinogen 163 (L) 177 - 386 mg/dL   D-Dimer, Quantitative    Collection Time: 12/12/19  3:32 AM   Result Value Ref Range    D-Dimer 268 (H) <230 ng/mL DDU       Medications:  Scheduled Meds:  ??? cholecalciferol (vitamin D3)  5,000 Units Oral Daily   ??? fluticasone propionate  1 puff Inhalation Q12H (RT)   ??? magnesium oxide  400 mg Oral BID   ??? metronidazole  500 mg Intravenous [facility]   ??? multivitamin, with zinc  2 tablet Oral Daily   ??? pantoprazole (PROTONIX) intravenous solutio  40 mg Intravenous BID   ??? predniSONE  15 mg Oral Daily   ??? rifAXIMin  550 mg Oral BID   ??? spironolactone  50 mg Oral Daily   ??? [START ON 12/13/2019] valGANciclovir  450 mg Oral Q48H     Continuous Infusions:  ??? heparin 3 mL/hr (12/12/19 0700)   ???  lactated Ringers 95 mL/hr (12/12/19 0700)   ??? norepinephrine bitartrate-NS Stopped (12/12/19 0816)   ??? octreotide infusion 50 mcg/hr (12/12/19 0700)     PRN Meds:.ondansetron

## 2019-12-12 NOTE — Unmapped (Signed)
Pt remains in the PICU stable on room air overnight. No acute events. Afebrile. SBP 80s-110s, MAPs 55-70s. MD aware of soft pressures. Received two scheduled doses of albumin. Remains on octreotide infusion. After denying need to void several times, this RN bladder scanned pt for >999 ml. With much encouragement, pt was able to void 835 ml, dark amber in color. Remains NPO. Mom at bedside, very attentive to pt. Questions and concerns addressed. WCTM.       Problem: Adult Inpatient Plan of Care  Goal: Plan of Care Review  Outcome: Ongoing - Unchanged  Goal: Patient-Specific Goal (Individualization)  Outcome: Ongoing - Unchanged  Goal: Absence of Hospital-Acquired Illness or Injury  Outcome: Ongoing - Unchanged  Goal: Optimal Comfort and Wellbeing  Outcome: Ongoing - Unchanged  Goal: Readiness for Transition of Care  Outcome: Ongoing - Unchanged  Goal: Rounds/Family Conference  Outcome: Ongoing - Unchanged     Problem: Skin Injury Risk Increased  Goal: Skin Health and Integrity  Outcome: Ongoing - Unchanged     Problem: Self-Care Deficit  Goal: Improved Ability to Complete Activities of Daily Living  Outcome: Ongoing - Unchanged

## 2019-12-12 NOTE — Unmapped (Signed)
Care Management  Initial Transition Planning Assessment    Per H&P:  [name] Gross is a 19 y.o. 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 hepatorenal syndrome (HRS) with possible GI bleed from known esophageal varices. Pt afebrile without abd tenderness but WBC 15, thus spontaneous bacterial peritonitis is also on the differential. Ammonia level wnl and mentating appropriately thus less concern for hepatic encephalopathy, but will monitor closely for AMS. Pediatric GI was consulted upon admission. Plan to obtain liver transplant ultrasound, labs to evaluate for HRS, IVFs for resuscitation, IV protonix for possible bleed, and broad spectrum antibiotics pending culture results. Per GI, will hold nephrotoxic medications for overnight given AKI, and discuss restarting his immunosuppressive agents with Dr. Gean Quint (primary Peds GI) in the AM.    Reason for Admission: Admitting Diagnosis:  AKI/liver injury/ weakness  Past Medical History:   has a past medical history of Acute rejection of liver transplant (CMS-HCC) (08/20/2017), Depression, MSSA bacteremia (08/27/2019), Seasonal allergies, Sickle cell trait (CMS-HCC), and Strep Mitis Central line-associated bloodstream infection (08/27/2019).  Past Surgical History:   has a past surgical history that includes Frenulectomy, lingual; pr upper gi endoscopy,ctrl bleed (Left, 05/05/2016); pr upper gi endoscopy,ctrl bleed (N/A, 05/12/2016); pr upper gi endoscopy,biopsy (N/A, 07/15/2016); pr transplant liver,allotransplant (N/A, 09/22/2016); pr transplant,prep donor liver, whole (N/A, 09/22/2016); pr ercp stent placement biliary/pancreatic duct (N/A, 11/21/2016); pr ercp remove foreign body/stent biliary/panc duct (N/A, 08/20/2017); pr upgi endoscopy w/us fn bx (N/A, 08/20/2017); pr ercp,w/removal stone,bil/pancr ducts (N/A, 08/20/2017); pr ercp remove foreign body/stent biliary/panc duct (N/A, 06/04/2018); pr ercp,w/removal stone,bil/pancr ducts (N/A, 06/04/2018); and pr upper gi endoscopy,diagnosis (N/A, 09/21/2019).   Previous admit date: 09/18/2019              General:  Care Manager assessed the patient by : In person interview with patient, In person interview with family, Discussion with Clinical Care team, Medical record review  CM met with patient in pt room.  Pt/visitors were wearing hospital provided masks for the duration of the interaction with CM.   CM was wearing hospital provided surgical mask and hospital provided eye protection.  CM was not within 6 foot of the patient/visitors during this interaction.     Orientation Level: Appropriate for developmental age  Who provides care at home?: Family member  Reason for referral: Discharge Planning    Contact/Decision Maker  Extended Emergency Contact Information  Primary Emergency Contact:  [name] Gross   Mobile Phone: [phone]  Relation: Mother  Secondary Emergency Contact: Gross, [name]  Mobile Phone: [phone]  Relation: paternal grandmother    Legal Next of Kin / Guardian / [place] / Advance Directives   Pt states he is his HCDM.    Patient Information  Lives with: Family members, Parent   Pt states he is currently living with his mother but stays at his grandmother's house as well. Per pt, at his mother's house it is her, her husband, and his younger sister and brother.     Type of Residence: Private residence  [name] Gross (mother) Address:  [address]  [place] [address]      [name] Gross (pgm) Address:    [address]  [place] [address]    Support System: family/friends, [facility], [place]  Functional level prior to admission: Independent  Pt is not in any school/college at this time.    Responsibilities/Dependents at home?: No    Home Care services in place prior to admission?: No    Outpatient/Community Resources in place prior to admission: Outpatient Therapy (specify)   Mom states PT just started coming to the house to work with him.    Equipment Currently Used at Home: commode, walker, rolling, transport chair  other (see comments)  Per mother Hospice has ordered/provided these equipment items.    Currently receiving outpatient dialysis?: No    Financial Information  Need for financial assistance?: Yes, food bag referral request  Pt states he is not able to work due to his health. Pt and mom state he has applied for SSI for disability but keeps being denied. Mother states his outpatient/Hospice SW is helping with SSI application.    Primary Insurance- Payor: MEDICAID [place] / Plan: MEDICAID [place] ACCESS / Product Type: *No Product type* /   Secondary Insurance ??? None  Prescription Coverage ??? Medicaid  Preferred Pharmacy - [id number] - [place], [place] - [address] AT [facility] OF [address]  [place] [facility] [place] - [address]  [facility] [place] [facility] [place] - [address]  [facility]  [facility]    Food insecurity: pt at risk    Social Determinants of Health  Social Determinants of Health were addressed in provider documentation.  Please refer to patient history.    Discharge Needs Assessment    Medical Provider(s): CLAUDIA C PROSE, MD    Concerns to be Addressed: no discharge needs identified, denies needs/concerns at this time    Clinical Risk Factors:      Barriers to taking medications: No    Prior overnight hospital stay or ED visit in last 90 days: Yes    Readmission Within the Last 30 Days: no previous admission in last 30 days     Anticipated Changes Related to Illness: none    Equipment Needed After Discharge: walker, rolling, commode, transport chair other (see comments) Per mother Hospice has ordered/provided these equipment items.    Discharge Facility/Level of Care Needs:  N/A    Readmission  Risk of Unplanned Readmission Score: UNPLANNED READMISSION SCORE: 25%  Predictive Model Details           25% (High) Factors Contributing to Score   Calculated 12/12/2019 13:57 18% Number of active Rx orders is 32   [place] Risk of Unplanned Readmission Model 9% Number of ED visits in last six months is 2     8% Number of hospitalizations in last year is 2     8% ECG/EKG order is present in last 6 months     8% Latest calcium is low (8.1 mg/dL)     6% Encounter of ten days or longer in last year is present     6% Diagnosis of electrolyte disorder is present     6% Imaging order is present in last 6 months     5% Latest hemoglobin is low (8.0 g/dL)     5% Phosphorous result  is present     4% Diagnosis of deficiency anemia is present     4% Active anticoagulant Rx order is present     4% Active corticosteroid Rx order is present     4% Latest creatinine is high (1.90 mg/dL)     2% Charlson Comorbidity Index is 2     2% Future appointment is scheduled     1% Age is 18     1% Current length of stay is 1.519 days     1% Active ulcer medication Rx order is present     Readmitted Within the Last 30 Days? (No if blank)   Patient at risk for readmission?: Yes    Discharge Plan  Screen findings are: Care Manager reviewed the plan of the patient's care with the Multidisciplinary Team. No discharge planning needs identified at this time. Care Manager will continue to manage plan and monitor patient's progress with the team.    Expected Discharge Date:  TBD    Expected Transfer from Critical Care:  TBD     Patient and/or family were provided with choice of facilities / services that are available and appropriate to meet post hospital care needs?: N/A     Transportation home: Private vehicle    Initial Assessment complete?: Yes

## 2019-12-12 NOTE — Unmapped (Signed)
Pediatric Vancomycin Therapeutic Monitoring Pharmacy Note  ??  [name] Gross is a 19 y.o. male continuing vancomycin. Date of therapy initiation: 12/11/19   ??  Indication: Bacteremia/Sepsis and Suspected infection   ??  Prior Dosing Information: Dose by levels (last dose: vancomycin 1000 mg IV x 1 dose on 1/31 @0325 )    ??  Dosing Weight: 52.9 kg  ??  Goals:  Therapeutic Drug Levels  Vancomycin trough goal: 10-20 mg/L  ??  Additional Clinical Monitoring/Outcomes  Renal function, volume status (intake and output)  ??  Results: Vancomycin random level: 21. mg/L, drawn ~13 hours after last dose  ??      Wt Readings from Last 1 Encounters:   12/11/19 52.9 kg (116 lb 10 oz) (2 %, Z= -2.03)*   ??  * Growth percentiles are based on CDC (Boys, 2-20 Years) data.   ??        Creatinine   Date Value Ref Range Status   12/11/2019 1.92 (H) 0.70 - 1.30 mg/dL Final   16/08/9603 5.40 (H) mg/dL Final   98/09/9146 8.29 0.70 - 1.30 mg/dL Final      ??  UOP: 1.1 ml/kg/hr   ??  Pharmacokinetic Considerations and Significant Drug Interactions:  ?? Dosed per pediatric guideline  ?? Concurrent nephrotoxic meds: spironolactone   ? Holding home tacro until renal function improves  ??  Assessment/Plan:  - Level is supratherapeutic with low suspicion for infection.     Recommendation(s)  ?? Hold vancomycin for 12 hours and redraw a random vancomycin level.   ??  Follow-up  ?? Next level ordered 2/01 @1900   ?? A pharmacist will continue to monitor and order levels as appropriate  ??  Please page service pharmacist with questions/clarifications.    Mabeline Caras, PharmD, BCPPS

## 2019-12-12 NOTE — Unmapped (Signed)
ARTERIAL LINE (A-Line) PLACEMENT    Date: 12/11/2019  Time: 6:13 PM  Indication: Hemodynamic monitoring  Resident/Fellow: Lanier Prude  Supervising Attending: Loree Fee      The risks and benefits of the procedure were explained including infection prevention measures. Informed consent was obtained, refer to the consent documentation in the patient's chart.  A time-out was completed verifying correct patient, procedure, site, positioning, and special equipment if applicable. The patient???s left wrist was prepped and draped in sterile fashion. 1% Lidocaine was used to anesthetize the area. A 20 G Arrow line was introduced into the radial artery. An introducer needle was inserted into the vessel. The guide wire was easily threaded into the vessel.  The catheter was threaded over the guide wire and the needle was removed with appropriate pulsatile blood return. The catheter was then sutured in place to the skin and a sterile dressing applied. Perfusion to the extremity distal to the point of catheter insertion was checked and found to be adequate.  A pressure transducer was connected sterilely to the arterial line and an arterial line waveform was noted on the monitor. The attending was available for the entire procedure.    Estimated Blood Loss: <3 ml  The patient tolerated the procedure well and there were no complications.

## 2019-12-13 LAB — PATHOLOGIST SMEAR REVIEW

## 2019-12-13 LAB — CBC W/ AUTO DIFF
BASOPHILS ABSOLUTE COUNT: 0.1 10*9/L (ref 0.0–0.1)
BASOPHILS RELATIVE PERCENT: 0.7 %
EOSINOPHILS ABSOLUTE COUNT: 0 10*9/L (ref 0.0–0.4)
EOSINOPHILS RELATIVE PERCENT: 0.1 %
HEMATOCRIT: 22.8 % — ABNORMAL LOW (ref 41.0–53.0)
HEMOGLOBIN: 7.5 g/dL — ABNORMAL LOW (ref 13.5–17.5)
LARGE UNSTAINED CELLS: 1 % (ref 0–4)
LYMPHOCYTES RELATIVE PERCENT: 4 %
MEAN CORPUSCULAR HEMOGLOBIN CONC: 33 g/dL (ref 31.0–37.0)
MEAN CORPUSCULAR HEMOGLOBIN: 28.9 pg (ref 26.0–34.0)
MEAN CORPUSCULAR VOLUME: 87.6 fL (ref 80.0–100.0)
MEAN PLATELET VOLUME: 7.5 fL (ref 7.0–10.0)
MONOCYTES ABSOLUTE COUNT: 1.1 10*9/L — ABNORMAL HIGH (ref 0.2–0.8)
MONOCYTES RELATIVE PERCENT: 11.1 %
NEUTROPHILS ABSOLUTE COUNT: 7.9 10*9/L — ABNORMAL HIGH (ref 2.0–7.5)
NEUTROPHILS RELATIVE PERCENT: 83 %
PLATELET COUNT: 122 10*9/L — ABNORMAL LOW (ref 150–440)
RED BLOOD CELL COUNT: 2.61 10*12/L — ABNORMAL LOW (ref 4.50–5.90)
RED CELL DISTRIBUTION WIDTH: 23.9 % — ABNORMAL HIGH (ref 12.0–15.0)
WBC ADJUSTED: 9.5 10*9/L (ref 4.5–11.0)

## 2019-12-13 LAB — COMPREHENSIVE METABOLIC PANEL
ALBUMIN: 2.6 g/dL — ABNORMAL LOW (ref 3.5–5.0)
ALKALINE PHOSPHATASE: 804 U/L — ABNORMAL HIGH (ref 65–260)
AST (SGOT): 141 U/L — ABNORMAL HIGH (ref 19–55)
BLOOD UREA NITROGEN: 22 mg/dL — ABNORMAL HIGH (ref 7–21)
BUN / CREAT RATIO: 10
CALCIUM: 8.1 mg/dL — ABNORMAL LOW (ref 8.5–10.2)
CHLORIDE: 111 mmol/L — ABNORMAL HIGH (ref 98–107)
CREATININE: 2.23 mg/dL — ABNORMAL HIGH (ref 0.70–1.30)
EGFR CKD-EPI AA MALE: 48 mL/min/{1.73_m2} — ABNORMAL LOW (ref >=60)
EGFR CKD-EPI NON-AA MALE: 41 mL/min/{1.73_m2} — ABNORMAL LOW (ref >=60)
GLUCOSE RANDOM: 144 mg/dL (ref 70–179)
POTASSIUM: 3.3 mmol/L — ABNORMAL LOW (ref 3.5–5.0)
SODIUM: 135 mmol/L (ref 135–145)

## 2019-12-13 LAB — ALBUMIN: Albumin:MCnc:Pt:Ser/Plas:Qn:: 2.6 — ABNORMAL LOW

## 2019-12-13 LAB — FIBRINOGEN LEVEL: Fibrinogen:MCnc:Pt:PPP:Qn:Coag: 127 — ABNORMAL LOW

## 2019-12-13 LAB — HEMOGLOBIN
Hemoglobin:MCnc:Pt:Bld:Qn:: 7.2 — ABNORMAL LOW
Hemoglobin:MCnc:Pt:Bld:Qn:: 7.7 — ABNORMAL LOW
Hemoglobin:MCnc:Pt:Bld:Qn:: 7.9 — ABNORMAL LOW

## 2019-12-13 LAB — D-DIMER QUANTITATIVE (CH,ML,PD,ET): Fibrin D-dimer DDU:MCnc:Pt:PPP:Qn:: 345 — ABNORMAL HIGH

## 2019-12-13 LAB — SLIDE REVIEW

## 2019-12-13 LAB — SCHISTOCYTES

## 2019-12-13 LAB — PROTIME
Coagulation tissue factor induced:Time:Pt:PPP:Qn:Coag: 22.2 — ABNORMAL HIGH
Coagulation tissue factor induced:Time:Pt:PPP:Qn:Coag: 22.7 — ABNORMAL HIGH

## 2019-12-13 LAB — PROTIME-INR
PROTIME: 22.7 s — ABNORMAL HIGH (ref 10.5–13.5)
PROTIME: 22.2 s — ABNORMAL HIGH (ref 10.5–13.5)

## 2019-12-13 LAB — PLATELET COUNT: Platelets:NCnc:Pt:Bld:Qn:Automated count: 116 — ABNORMAL LOW

## 2019-12-13 LAB — MAGNESIUM: Magnesium:MCnc:Pt:Ser/Plas:Qn:: 1.9

## 2019-12-13 LAB — ANISOCYTOSIS

## 2019-12-13 LAB — PHOSPHORUS: Phosphate:MCnc:Pt:Ser/Plas:Qn:: 3.9

## 2019-12-13 LAB — HEPARIN CORRELATION: Lab: 0.3

## 2019-12-13 LAB — HEMOGLOBIN AND HEMATOCRIT, BLOOD: HEMOGLOBIN: 7.7 g/dL — ABNORMAL LOW (ref 13.5–17.5)

## 2019-12-13 NOTE — Unmapped (Signed)
PICU Progress Note  Interval events: NPO at [facility] for EGD. Didn't void for >8h so did bladder scan and showed volume of 600 mL; voided 400 mL after bladder scan. Hb 7.2 overnight ,HDS, no transfusion. MAP remained around 63-70 overnight.     LOS: 2 days    Assessment: 19 y.o. 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 Underlying etiology of his emesis initially presumably led to dehydration and pre-renal AKI still unclear, differential diagnosis on hematemesis at this time includes oozing from portal hypertensive gastropathy, a Mallory-Weiss tear in the setting of multiple episodes of emesis, liver angiectasia's in the setting of liver disease, gastritis and erosions. Less likely to be variceal or PUD due to stability of Hb and no overt bleeding. Adult hepatology and pediatric GI consulted, initially recommended octreotide infusion that was discontinued yesterday with endoscopy when hemodynamically stable. EGD planned with candidal esophagitis findings, plan to start fluconazole. He is currently stable but tenuous. He has worsening renal function today, plan to involved Peds Nephrology for recommendations in light of multiple nephrotoxic medications. Plan to discontinue Vancomycin 2/2 given Blood Cx 1/30 at [facility] no growth to date x3 days and improved hemodynamics.     Plan:     Resp: hx of asthma  - SORA  - Home flovent 1 puff BID  - Albuterol PRN  - VBG PRN  ??  CV: initially  hypotensive, continuing albumin and volume resuciations, improved MAPs  - CRM  - s/p albumin 25 % 25 g x 5, consider further dosing as necessary   - MAP goals of >60   ??  Heme: Repeat PVL on 11/13 demonstrate stable DVTs in RUE axial and brachial veins, not on therapeutic anticoagulation  - CBCd and DIC daily - H/H q8h  - SCDs for ppx  ??  ID:  - Cont Cefepime and Flagyl   - Discontinue Vancomycin 2/2 given Blood Cx 1/30 at [facility] no growth to date x3 days and improved hemodynamics   - Peritoneal cultures pending 1/31  - holding home Bactrim ppx (usually M-W-F) due to AKI  - home valganciclovir daily, re-dosed renally  - Start Fluconazole daily per Adult Hepatology for candidal esophagitis, renal-dose  ??  Neuro:  - neuro checks q4h  ??  FEN/GI:   - Strict I/O's  - Resume low-salt diet or clears  - when ready to resume diet, recs per transplant RD of Vital AF @ 20 ml/hr increasing by 10 ml/hr q4hr until reaching goal of 80 ml/hr. Flush min of 30 ml q6hr to maintain patency of tube   - mIVFs via LR @95ml /hr  - IV Zofran 4mg  q8h PRN  - CMP daily   - IV Protonix 40mg  BID  - Home aquadeks, mag-ox, vit D3  - adult hepatology, peds GI consulted   -EGD today    Renal: AKI on admission with Cr 2.7, likely prerenal in the setting of dehydration vs. hepatorenal syndrome, interval improvement after fluid resuscitation, today Cr 2.23 of unclear etiology but on multiple nephrotoxic medication  - Peds Neph C/s  - Stop Vanc   - Dose-adjust meds renally   - Strict I/Os  ??  Hepatology:   - adult hepatology, peds GI consulted   -endoscopy planned for 2/2  - d/c octreotide infusion  - re-start  home mycophenolate 2/2  - held tacrolimus given nephrotoxicity per Peds GI  - home spironolactone 50mg  daily  - home rifaximin 550mg  BID  - holding prednisone 15mg  daily while on stress dose steroids  - HFP daily  - Start Fluconazole per Adult Hepatology for candidal esophagitis  ??  ENDO: hx of steroid-induced hyperglycemia, previously on insulin but not currently. Euglycemic on admission.   - stress dose-steroids   ??  DERM: steroid-induced acne and atypical dermatitis  - Resume home topical clindamycin and benzoyl peroxide  ??  Psych: Seen previously by Dr. Jessee Avers, set up with [facility]  - consulted children's supportive care  - consulted adult pscyh    RASS goal: not sedated  DVT ppx: None     Changes to Lines/Tubes: None   Family communication: Mom updated at bedside   Dispo: PICU    PICU Resident Pager: [phone]  PICU Resident Phone: [phone]    Vitals: AF, 100/50s, MAP 60-70s  Dosing weight: 52.9 kg (116 lb 10 oz) (12/11/19 0456)  Most recent weight: 52.9 kg (116 lb 10 oz) (12/11/19 0200)    Temp:  [36.1 ??C-36.4 ??C] 36.3 ??C  Heart Rate:  [70-95] 70  SpO2 Pulse:  [70-90] 70  Resp:  [15-29] 16  BP: (71-102)/(38-67) 102/67  MAP (mmHg):  [49-78] 50  A BP-2: (83-140)/(42-86) 120/80  MAP:  [56 mmHg-104 mmHg] 93 mmHg  SpO2:  [100 %] 100 %     I/O       01/31 0701 - 02/01 0700 02/01 0701 - 02/02 0700    I.V. (mL/kg) 2323.9 (43.9) 2216 (41.9)    Blood 275     IV Piggyback 2347 600    Total Intake 4945.9 2816    Urine (mL/kg/hr) 1425 (1.1) 300 (0.2)    Stool 200 450    Total Output(mL/kg) 1625 (30.7) 750 (14.2)    Net +3320.9 +2066          Urine Occurrence 1 x 1 x    Stool Occurrence 1 x 3 x           Physical Exam:   General: cachetic, awake and alert, sitting up in bed, able to converse well   Head: [place]/AT,  Lungs:  Clear to ascultation bilaterally, no wheezes or crackles, no tachypnea, normal WOB on room air  Heart:   Normal S1 and S2, no murmurs, radial pulses palpable  Abdomen: surgical scars on abdomen present. Abdomen is round and full, but soft. Unable to assess for fluid wave or organomegaly.  Neuro:  Answers questions in short phrases, cooperates with exam. No focal deficits.  Extremities:  Thin, muscle wasting, edema noted in LE  Skin:  Full body jaundice, skin warm and dry,     Labs and studies reviewed. Pertinent results include :  Hb 9-> 7.2 -> 7.5 -> 7.7  PT: 22.9-> 22.7  PTT: 44-> 49  K: 3.3  Cr: 1.9-> 2.23  Meld Score 34 yesterday     Lab Results   Component Value Date    WBC 8.9 12/12/2019    HGB 7.2 (L) 12/13/2019    HCT 21.5 (L) 12/13/2019    PLT 116 (L) 12/13/2019       Lab Results   Component Value Date    NA 133 (L) 12/12/2019    K 3.5 12/12/2019    CL 109 (H) 12/12/2019    CO2 14.0 (L) 12/12/2019    BUN 18 12/12/2019    CREATININE 1.90 (H) 12/12/2019  GLU 147 (H) 12/12/2019    CALCIUM 8.1 (L) 12/12/2019    MG 1.8 12/12/2019    PHOS 4.3 12/12/2019       Lab Results   Component Value Date    BILITOT 30.8 (H) 12/12/2019    BILITOT 30.8 (H) 12/12/2019    BILIDIR 28.30 (H) 12/12/2019    PROT 4.6 (L) 12/12/2019    PROT 4.6 (L) 12/12/2019    ALBUMIN 2.4 (L) 12/12/2019    ALBUMIN 2.4 (L) 12/12/2019    ALT 142 (H) 12/12/2019    ALT 142 (H) 12/12/2019    AST 203 (H) 12/12/2019    AST 203 (H) 12/12/2019    ALKPHOS 1,054 (H) 12/12/2019    ALKPHOS 1,054 (H) 12/12/2019    GGT 256 (H) 12/11/2019       Lab Results   Component Value Date    PT 22.7 (H) 12/13/2019    INR 1.97 12/13/2019    APTT 49.0 (H) 12/13/2019       Lines/Tubes:   Patient Lines/Drains/Airways Status    Active Active Lines, Drains, & Airways     Name:   Placement date:   Placement time:   Site:   Days:    Peripheral IV 09/30/19 Left;Posterior Forearm   09/30/19    0958    Forearm   73    Arterial Line 12/11/19 Left Radial   12/11/19    1745    Radial   1    Arterial Infusion Catheter 12/11/19 2.5 2.5 cm Non-tunneled   12/11/19    1745     1                Alfonse Ras, MD  [facility] Pediatric Residency, PGY2  Pager: [phone]

## 2019-12-13 NOTE — Unmapped (Signed)
Okie stable in PICU this shift.    No PRNs given, denies pain/nausea. AxOx4, moves all extremities (tremors noted).   Stable on room air, no desats.   Hypotensive this shift, maintaining systolics in 80s-90s and MAPs in 50s-60s. Norepi not initiated per MD. Albumin given x1, IV steroids given x2. LR @ 95. Octreotide stopped this shift. H&H trended, see results. LR @ 95 via PIV.  NPO/clear diet. Tolerating small sips. Loose BM noted x2, diminished UOP. Jaundiced, edematous, generalized weakness. Expressing frustration throughout shift, psych/supportive care at bedside.    Mom at bedside, interactive in cares, updated on plan of care.    Problem: Adult Inpatient Plan of Care  Goal: Plan of Care Review  Outcome: Ongoing - Unchanged  Goal: Patient-Specific Goal (Individualization)  Outcome: Ongoing - Unchanged  Goal: Absence of Hospital-Acquired Illness or Injury  Outcome: Ongoing - Unchanged  Goal: Optimal Comfort and Wellbeing  Outcome: Ongoing - Unchanged  Goal: Readiness for Transition of Care  Outcome: Ongoing - Unchanged  Goal: Rounds/Family Conference  Outcome: Ongoing - Unchanged

## 2019-12-13 NOTE — Unmapped (Signed)
Pediatric Nephrology   Consult Note     Requesting Attending Physician :  Lorenza Burton, MD  Service Requesting Consult : Pediatric ICU (PMS)    Reason for Consult: AKI    Problem List:     Problem List     None          Assessment and Plan:     [name] Gross is a 19 y.o. male with a history of cirrhosis s/p liver transplant in 2017  complicated by by rejection and subsequent liver failure admitted with volume depletion and AKI presumed secondary to coffee ground emesis. His AKI was likely worsened by tacrolimus (vasoconstrictive) and his history of multiple previous episodes of kidney injury. I favor this etiology over HRS at this time. Our service has consulted on [name] Gross in the past for AKI and HTN. I expect urine microcopy to support ATN, I am awaiting a specimen. He has had multiple episodes of AKI in the past which may make his recovery from this episode of presumed  ATN slower than one would expect with healthy underlying kidneys. I expect his creatinine to gradually improve, but it may take some time to do so. In the meantime, please follow his urine output closely. If he is oliguric would recommend changing his IVF to insensibles with 1:1 urine replacement to avoid fluid overload.     Recommendations:  --Recommend Renal US  --Need to carefully consider timing of re-initiation of tac given nephrotoxicity and risk of rejection.   --Pharmacy has already dose adjusted his medications for a GFR of 45-50 ml/min/1.73 m2 using MDRD  --Very close monitoring of I/Os   -May need to reduce IVF rate if urine output remains low this evening   --Recommend net even if possible  - Avoid additional nephrotoxicic agents as able    Subjective:     19 year old male with history of cryptogenic cirrhosis??s/p liver transplant in 2017 with non -adherence and history of multiple rejection episodes leading to liver failure. Transferred from OSH due to decreased PO intake and vomiting x 2 weeks. Emesis recently turned darker brown in appearance. No urine output x 24 hours prior to admission. At OSH, initial blood pressures of 60s/40s, HR 90s.  Na 132, K 2.9, Cl 102, BUN 26, Cr 2.7 Received 60 ml/kg of volume resuscitation prior to arrival at [facility]. Home meds include tacrolimus, valcyte,  MMF.     Over the past 2 days, urine output improved to 1425 ml.   FENa 1.8% and FEurea 61% on day of admission.     Creatinine       1.8   on 11/5    1.07 on 12/10    1.78 on 12/29      2.2 on     2/2         has a past medical history of Acute rejection of liver transplant (CMS-HCC) (08/20/2017); Depression; Seasonal allergies; and Sickle cell trait (CMS-HCC).    Review of Systems: ten systems reviewed and negative but for that noted in HPI    Medications:     Current Facility-Administered Medications   Medication Dose Route Frequency Provider Last Rate Last Admin   ??? benzoyl peroxide 5 % gel   Topical Nightly Alfonse Ras, MD       ??? budesonide (PULMICORT) nebulizer solution 1 mg  1 mg Nebulization BID (RT) Alfonse Ras, MD       ??? cefepime (MAXIPIME) 2,000 mg in sodium chloride 0.9 % (NS)  100 mL IVPB-connector bag  50 mg/kg (Dosing Weight) Intravenous Q24H Ether Griffins, MD   Stopped at 12/13/19 0159   ??? cholecalciferol (vitamin D3) tablet 5,000 Units  5,000 Units Oral Daily Alecia Lemming, MD   5,000 Units at 12/13/19 0800   ??? clindamycin (CLEOCIN T) 1 % external solution   Topical Nightly Alfonse Ras, MD       ??? [START ON 12/14/2019] fluconazole (DIFLUCAN) tablet 100 mg  100 mg Oral Daily Alfonse Ras, MD        Followed by   ??? fluconazole (DIFLUCAN) tablet 200 mg  200 mg Oral Daily Alfonse Ras, MD   Stopped at 12/13/19 1452   ??? heparin 1,000 units/500 mL (2 units/mL) in 0.9% NaCl infusion  3 mL/hr Intravenous Continuous Alecia Lemming, MD 3 mL/hr at 12/13/19 1700 3 mL/hr at 12/13/19 1700   ??? hydrocortisone sod succ (Solu-CORTEF) injection 20 mg  50 mg/m2/day (Dosing Weight) Intravenous Q6H [name], MD   20 mg at 12/13/19 1733   ??? lactated Ringers infusion  0-95 mL/hr Intravenous Continuous Alfonse Ras, MD 95 mL/hr at 12/13/19 1757 95 mL/hr at 12/13/19 1757   ??? magnesium oxide (MAG-OX) tablet 400 mg  400 mg Oral BID Alecia Lemming, MD   400 mg at 12/13/19 0801   ??? metroNIDAZOLE (FLAGYL) IVPB 500 mg  500 mg Intravenous [facility] Elesa Hacker, MD   Stopped at 12/13/19 1549   ??? multivitamin, with zinc (AQUADEKS) chewable tablet  2 tablet Oral Daily Alecia Lemming, MD   2 tablet at 12/13/19 0801   ??? mycophenolate (MYFORTIC) EC tablet 540 mg  540 mg Oral BID Alfonse Ras, MD       ??? ondansetron (ZOFRAN) injection 4 mg  4 mg Intravenous Q8H PRN Alecia Lemming, MD   4 mg at 12/12/19 0128   ??? pantoprazole (PROTONIX) injection 40 mg  40 mg Intravenous BID Alecia Lemming, MD   40 mg at 12/13/19 1734   ??? rifAXIMin (XIFAXAN) tablet 550 mg  550 mg Oral BID Alecia Lemming, MD   550 mg at 12/13/19 0800   ??? spironolactone (ALDACTONE) tablet 50 mg  50 mg Oral Daily Alecia Lemming, MD   Stopped at 12/12/19 0820   ??? [START ON 12/15/2019] valGANciclovir (VALCYTE) tablet 450 mg  450 mg Oral Q48H Alfonse Ras, MD           Allergies:     Allergies   Allergen Reactions   ??? Bee Pollen Rash     Pt has seasonal allergies that cause excessive sneezing and running nose- [name], NAII   ??? Pollen Extracts Rash     Pt has seasonal allergies that cause excessive sneezing and running nose- [name]       Past Medical History:     Past Medical History:   Diagnosis Date   ??? 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 Depression    ??? MSSA bacteremia 08/27/2019   ??? Seasonal allergies    ??? Sickle cell trait (CMS-HCC)    ??? Strep Mitis Central line-associated bloodstream infection 08/27/2019       Family History:     Family History   Problem Relation Age of Onset   ??? Diabetes Maternal Grandmother    ??? Diabetes Paternal  Grandmother    ??? Hypertension Maternal Grandfather    ??? Hypertension Paternal Grandfather    ??? Asthma Brother    ??? Anesthesia problems Neg Hx    ??? Melanoma Neg Hx    ??? Basal cell carcinoma Neg Hx    ??? Squamous cell carcinoma Neg Hx        Social History:     Social History     Social History Narrative    Lives part time with mother and part time with MGM.        Objective:     BP 87/50  - Pulse 70  - Temp 36 ??C (96.8 ??F) (Axillary)  - Resp 18  - Ht 170.2 cm (5' 7.01)  - Wt 52.9 kg (116 lb 10 oz)  - SpO2 99%  - BMI 18.26 kg/m??   2 %ile (Z= -2.03) based on CDC (Boys, 2-20 Years) weight-for-age data using vitals from 12/11/2019.  18 %ile (Z= -0.92) based on CDC (Boys, 2-20 Years) Stature-for-age data based on Stature recorded on 12/11/2019.    General Appearance:  Sleeping comfortably   HEENT: lips dry  Chest:  Lungs clear b/l, normal RR and WOB   Heart:  RRR, normal S1 and S2, no murmurs, rubs, or gallops  Extremities:  1+ edema  Neuro: sleeping    Labs:   No results displayed because visit has over 200 results.      ]    Radiology:

## 2019-12-13 NOTE — Unmapped (Signed)
OCCUPATIONAL THERAPY       Patient Name:  [name] Gross       Medical Record Number: [id number]   Date of Birth: Jan 13, 2001  Sex: Male          OT Treatment Diagnosis:  h/o liver transplant and rejection, admitted with cough, emesis, dehydration, AKI        Assessment  Assessment: Whittaker presents with cough, emesis and AKI in the setting of liver failure s/p transplant. He was awake and alert throughout evaluation, reporting ind with dressing, toileting at [facility], and self feeding. Observed pt with ind in bed mobility and educated him on the importance of OOB activity. Pt reports he gets home PT as part of palliative care services to address back pain. Pt denies further acute OT needs.  Today's Interventions: ADL retraining, Functional mobility, Bed mobility, Education - Patient    Activity Tolerance During Today's Session  Patient tolerated treatment well    Plan  Planned Frequency of Treatment:  D/C Services for: D/C Services         Post-Discharge Occupational Therapy Recommendations:  OT Post Acute Discharge Recommendations: OT services not indicated   OT DME Recommendations: None    GOALS:   Patient and Family Goals: go home ASAP    Long Term Goal #1: Pt will return to PLOF       Subjective  Current Status supine in bed, awake  Prior Functional Status Pt is independent with ADLs and mobility at baseline.            Patient / Caregiver reports: Pt reports I can do all the things by myself    Past Medical History:   Diagnosis Date   ??? 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 Depression    ??? MSSA bacteremia 08/27/2019   ??? Seasonal allergies    ??? Sickle cell trait (CMS-HCC)    ??? Strep Mitis Central line-associated bloodstream infection 08/27/2019    Social History     Tobacco Use   ??? Smoking status: Never Smoker   ??? Smokeless tobacco: Never Used   ??? Tobacco comment: Step-father smokes outside   Substance Use Topics   ??? Alcohol use: Never     Alcohol/week: 0.0 standard drinks     Frequency: Never     Comment: 1/2 glass of wine on rare occasions      Past Surgical History:   Procedure Laterality Date   ??? FRENULECTOMY, LINGUAL     ??? PR ERCP REMOVE FOREIGN BODY/STENT BILIARY/PANC DUCT N/A 08/20/2017    Procedure: ENDOSCOPIC RETROGRADE CHOLANGIOPANCREATOGRAPHY (ERCP); W/ REMOVAL OF FOREIGN BODY/STENT FROM BILIARY/PANCREATIC DUCT(S);  Surgeon: Vonda Antigua, MD;  Location: GI PROCEDURES [facility];  Service: Gastroenterology   ??? PR ERCP REMOVE FOREIGN BODY/STENT BILIARY/PANC DUCT N/A 06/04/2018    Procedure: ENDOSCOPIC RETROGRADE CHOLANGIOPANCREATOGRAPHY (ERCP); W/ REMOVAL OF FOREIGN BODY/STENT FROM BILIARY/PANCREATIC DUCT(S);  Surgeon: Chriss Driver, MD;  Location: GI PROCEDURES MEMORIAL [facility];  Service: Gastroenterology   ??? PR ERCP STENT PLACEMENT BILIARY/PANCREATIC DUCT N/A 11/21/2016    Procedure: ENDOSCOPIC RETROGRADE CHOLANGIOPANCREATOGRAPHY (ERCP); WITH PLACEMENT OF ENDOSCOPIC STENT INTO BILIARY OR PANCREATIC DUCT;  Surgeon: Vonda Antigua, MD;  Location: GI PROCEDURES [facility];  Service: Gastroenterology   ??? PR ERCP,W/REMOVAL STONE,BIL/PANCR DUCTS N/A 08/20/2017    Procedure: ERCP; W/ENDOSCOPIC RETROGRADE REMOVAL OF  CALCULUS/CALCULI FROM BILIARY &/OR PANCREATIC DUCTS;  Surgeon: Vonda Antigua, MD;  Location: GI PROCEDURES [facility];  Service: Gastroenterology   ??? PR ERCP,W/REMOVAL STONE,BIL/PANCR DUCTS N/A 06/04/2018    Procedure: ERCP; W/ENDOSCOPIC RETROGRADE REMOVAL OF CALCULUS/CALCULI FROM BILIARY &/OR PANCREATIC DUCTS;  Surgeon: Chriss Driver, MD;  Location: GI PROCEDURES [facility];  Service: Gastroenterology   ??? PR TRANSPLANT LIVER,ALLOTRANSPLANT N/A 09/22/2016    Procedure: LIVER ALLOTRANSPLANTATION; ORTHOTOPIC, PARTIAL OR WHOLE, FROM CADAVER OR LIVING DONOR, ANY AGE; Surgeon: Doyce Loose, MD;  Location: MAIN OR [facility];  Service: Transplant   ??? PR TRANSPLANT,PREP DONOR LIVER, WHOLE N/A 09/22/2016    Procedure: [facility] STD PREP CAD DONOR WHOLE LIVER GFT PRIOR TNSPLNT,INC CHOLE,DISS/REM SURR TISSU WO TRISEG/LOBE SPLT;  Surgeon: Doyce Loose, MD;  Location: MAIN OR [facility];  Service: Transplant   ??? PR UPGI ENDOSCOPY W/US FN BX N/A 08/20/2017    Procedure: UGI W/ TRANSENDOSCOPIC ULTRASOUND GUIDED INTRAMURAL/TRANSMURAL FINE NEEDLE ASPIRATION/BIOPSY(S), ESOPHAGUS;  Surgeon: Vonda Antigua, MD;  Location: GI PROCEDURES [facility];  Service: Gastroenterology   ??? PR UPPER GI ENDOSCOPY,BIOPSY N/A 07/15/2016    Procedure: UGI ENDOSCOPY; WITH BIOPSY, SINGLE OR MULTIPLE;  Surgeon: Arnold Long Mir, MD;  Location: PEDS PROCEDURE ROOM [facility];  Service: Gastroenterology   ??? PR UPPER GI ENDOSCOPY,CTRL BLEED Left 05/05/2016    Procedure: UGI ENDOSCOPY; WITH CONTROL OF BLEEDING, ANY METHOD;  Surgeon: Arnold Long Mir, MD;  Location: PEDS PROCEDURE ROOM [facility];  Service: Gastroenterology   ??? PR UPPER GI ENDOSCOPY,CTRL BLEED N/A 05/12/2016    Procedure: UGI ENDOSCOPY; WITH CONTROL OF BLEEDING, ANY METHOD;  Surgeon: Annie Paras, MD;  Location: CHILDRENS OR [facility];  Service: Gastroenterology   ??? PR UPPER GI ENDOSCOPY,DIAGNOSIS N/A 09/21/2019    Procedure: UGI ENDO, INCLUDE ESOPHAGUS, STOMACH, & DUODENUM &/OR JEJUNUM; DX W/WO COLLECTION SPECIMN, BY BRUSH OR WASH;  Surgeon: Monte Fantasia, MD;  Location: GI PROCEDURES [facility];  Service: Gastroenterology    Family History   Problem Relation Age of Onset   ??? Diabetes Maternal Grandmother    ??? Diabetes Paternal Grandmother    ??? Hypertension Maternal Grandfather    ??? Hypertension Paternal Grandfather    ??? Asthma Brother    ??? Anesthesia problems Neg Hx    ??? Melanoma Neg Hx    ??? Basal cell carcinoma Neg Hx    ??? Squamous cell carcinoma Neg Hx         Bee pollen and Pollen extracts     Objective Findings  Precautions / Restrictions  Non-applicable    Weight Bearing  Non-applicable    Required Braces or Orthoses  Non-applicable    Communication Preference  Verbal    Pain  denies    Equipment / Environment  Vascular access (PIV, TLC, Port-a-cath, PICC), Telemetry, Patient not wearing mask for full session    Living Situation  Living Environment: House  Lives With: Grandparent  Home Living: One level home, Stairs to enter with rails, Shower chair with back     Cognition   Orientation Level:  Oriented x 4   Arousal/Alertness:  Appropriate responses to stimuli   Attention Span:  Appears intact   Memory:  Appears intact     Vision / Perception        Vision: Wears glasses all the time  Perception: WNL       Hand Function  Hand Dominance: R  WNL    Skin Inspection  c/d/i as visualized    ROM /  Strength/Coordination  UE ROM/ Strength/ Coordination: WNL  LE ROM/ Strength/ Coordination: WNL      Balance:  ind    Functional Mobility  Transfer Assistance Needed: No  Bed Mobility Assistance Needed: No      ADLs  ADLs: Independent             Medical Staff Made Aware: RN aware      Occupational Therapy Session Duration  OT Individual - Duration: 10         I attest that I have reviewed the above information.  Signed: York Ram, OT  Ceasar Mons 12/13/2019

## 2019-12-13 NOTE — Unmapped (Signed)
ICU Nightly Progress Note    Interval events: Albumin given for hypotension today with nice response.  Octreotide stopped. Serial Hgb stable.      LOS: 1 days    Assessment: [name] Gross is a 19 y.o. 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      Plan:   - Continues on stress dose steroids with improved pressures, further albumin as needed  - saturations stable in RA  - NPO for EGD planned for tomorrow, LR for IVF.  On IV protonix and home meds.  - Remains on broad antimicrobial coverage with Vancomycin, flagyl, and cefepime    Vitals:  Dosing weight: 52.9 kg (116 lb 10 oz) (12/11/19 0456)  Most recent weight: 52.9 kg (116 lb 10 oz) (12/11/19 0200)    Temp:  [36.1 ??C (97 ??F)-36.8 ??C (98.2 ??F)] 36.3 ??C (97.3 ??F)  Heart Rate:  [79-109] 92  SpO2 Pulse:  [79-108] 90  Resp:  [12-29] 29  BP: (71-102)/(38-67) 102/67  MAP (mmHg):  [49-78] 78  A BP-2: (78-116)/(41-68) 89/55  MAP:  [55 mmHg-84 mmHg] 67 mmHg  FiO2 (%):  [21 %] 21 %  SpO2:  [100 %] 100 %     I/O       01/31 0701 - 02/01 0700 02/01 0701 - 02/02 0700    I.V. (mL/kg) 2323.9 (43.9) 1236 (23.4)    Blood 275     IV Piggyback 2347 100    Total Intake 4945.9 1336    Urine (mL/kg/hr) 1425 (1.1) 0 (0)    Stool 200 150    Total Output(mL/kg) 1625 (30.7) 150 (2.8)    Net +3320.9 +1186          Urine Occurrence 1 x 1 x    Stool Occurrence 1 x 1 x           Physical Exam:  General: no distress. Baseline tremor  HEENT: scleral icterus. MMM  CV: no murmur. 2+ distal pulses  Resp: clear bilaterally with good air movement  Abd: + bs. Full but soft.   Ext: thin. + edema  Neuro: answers questions appropriately, not happy about being in ICU.    Labs and studies reviewed.     Lines/Tubes:   Patient Lines/Drains/Airways Status    Active Active Lines, Drains, & Airways     Name:   Placement date:   Placement time:   Site:   Days:    Peripheral IV 09/30/19 Left;Posterior Forearm   09/30/19    0958 Forearm   73    Arterial Line 12/11/19 Left Radial   12/11/19    1745    Radial   1    Arterial Infusion Catheter 12/11/19 2.5 2.5 cm Non-tunneled   12/11/19    1745     1                  PEDIATRIC ICU ATTENDING ATTESTATION     The patient was critically ill during the time that I saw the patient. The Critical Care Time excluding procedures was 30 minutes.     Chrisandra Netters  12/12/2019  7:36 PM

## 2019-12-13 NOTE — Unmapped (Signed)
Pediatric Vancomycin Therapeutic Monitoring Pharmacy Note  ??  [name] Gross is a 19 y.o. male continuing vancomycin. Date of therapy initiation: 12/11/19   ??  Indication: Bacteremia/Sepsis and Suspected infection   ??  Prior Dosing Information: Dose by levels (last dose: vancomycin 1000 mg IV x 1 dose on 1/31 @1858 )    ??  Dosing Weight: 52.9 kg  ??  Goals:  Therapeutic Drug Levels  Vancomycin trough goal: 10-20 mg/L  ??  Additional Clinical Monitoring/Outcomes  Renal function, volume status (intake and output)  ??  Results: Vancomycin random level: 17.7 mg/L, drawn ~25 hours after last dose  ??      Wt Readings from Last 1 Encounters:   12/11/19 52.9 kg (116 lb 10 oz) (2 %, Z= -2.03)*   ??  * Growth percentiles are based on CDC (Boys, 2-20 Years) data.   ??        Creatinine   Date Value Ref Range Status   12/11/2019 1.92 (H) 0.70 - 1.30 mg/dL Final   81/19/1478 2.95 (H) mg/dL Final   62/13/0865 7.84 0.70 - 1.30 mg/dL Final      UOP: 1.1 ml/kg/hr   ??  Pharmacokinetic Considerations and Significant Drug Interactions:  ?? Dosed per pediatric guideline  ?? Concurrent nephrotoxic meds: spironolactone   ? Holding home tacro until renal function improves  ??  Assessment/Plan:  - Level is <20 mg/L and thus, appropriate to re-dose. PICU team would like to continue vancomycin for now.      Recommendation(s)  ?? Re-dose vancomycin 1000 mg (20 mg/kg) IV x 1 now  ?? Continue to dose vancomycin by level.  Re-check level 24-hours post-dose.   ??  Follow-up  ?? Next level ordered 2/2 @ 2200 (24-hour level)  ?? A pharmacist will continue to monitor and order levels as appropriate  ??  Please page service pharmacist with questions/clarifications.    Emmit Alexanders, PharmD  PGY2 Pediatric Pharmacy Resident  [phone] (phone)  [phone] (pager)

## 2019-12-13 NOTE — Unmapped (Signed)
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      Anthropometrics:  Current Wt: 52.9 kg (116 lb 10 oz) 2 %ile (Z= -2.03) based on CDC (Boys, 2-20 Years) weight-for-age data using vitals from 12/11/2019.  Length or Height: 170.2 cm (5' 7.01) 18 %ile (Z= -0.92) based on CDC (Boys, 2-20 Years) Stature-for-age data based on Stature recorded on 12/11/2019.  Current BMI:  Body mass index is 18.26 kg/m??. 2 %ile (Z= -2.09) based on CDC (Boys, 2-20 Years) BMI-for-age based on BMI available as of 12/11/2019.     Admission Wt: 52.9 kg (116 lb 10 oz)  Ideal Body Wt: 70 kg for weight in the 50th percentile     Dosing Wt: 52.9 kg (116 lb 10 oz)    Growth velocity/trends: weight fluctuates with fluid, but per pt in November 2020 his dry weight was ~115 lbs     Nutrition-Focused Physical Exam:  Unable to complete at this time due to limiting interactions between patient and provider during infectious outbreak in high risk population.    Nutritionally Relevant Data:  Meds: Nutritionally-relevant medications reviewed. Medications include Vitamin D3, magnesium oxide, AQUADEKS, protonix, spironolactone  IV Fluids:  LR @ 95 mL/hr   Labs:  K+ 3.3   Procedures: EGD + NJ tube placement     Emesis: prior to admission, coffee ground emesis. Dry heaves documented this AM   Stools: loose, watery documented this AM    Home Nutrition History:  Food Allergies/Intolerances:  None per pt and mom     PO Diet:  Regular   ?? Intake:  Very minimal for the past 2 weeks. Prior to this pt reports eating ~2 meals and 1-2 snacks per day.   ?? PO Supplements:  Infrequently drinks Ensure Clear, but pt reported yesterday that he only likes this supplement if consumed with peanut butter and jelly    Vitamin/Mineral Supplements:  ADEK, Vitamin D, magnesium oxide    Current Medical Nutrition Therapy:  Enteral/Parenteral Access: none, potential plan for NJ tube placement during EGD today     PO Diet Order: NPO  ?? % Meals Eaten:  None recorded, but per mom on 2/1 he had minimal intake of clear liquid diet     Enteral Order: none at this time     Estimated Nutrient Needs:  Nutrition Calculation Weight: Admission  Calculation Method: DRI for age  Calculation Consideration : (1905- 2380)    Estimated Energy Needs: Other (Comment)  Total Protein Estimated Needs: Other (Comment)(1.5- 2.0 g/kg)  Total Fluid Estimated Needs: Per Medical Team    Weight and Growth Goal:  Weight Gain Velocity Goal: Prevent weight loss during hospitalization    Pediatric AND/ASPEN Malnutrition Screening:  Primary Indicator of Malnutrition  Weight for length/height z-score (< 32 years of age): -2 to -2.9, indicating MODERATE protein-calorie malnutrition    Overall Impression: Patient meets criteria for MODERATE protein-calorie malnutrition (12/13/19 1302)    Nutrition Assessment: Nutrition consulted for assessment and TF recommendations. Pt continues to meet moderate malnutrition criteria since last outpatient RD visit. Pt with weight loss, but this is likely fluid related and pt is still above his reported dry weight of 115 lbs. Plan for EGD today to assess for source of pt's coffee ground emesis.  Nutrition Interventions/Recommendations:   - Recommend ADAT to Regular pending EGD results     - Once feeding tube is placed and tip location is confirmed, recommend starting Vital AF @ 20 mL/hr increasing by 10 mL q4hr until reaching goal of 80 mL/hr    - At goal this provides: 2016 kcal, 126 grams protein, 186 grams CHO and 1360 mL free water + flushes    - Flushes per team, but would flush at least 30 mL q6hr to maintain tube patency       Lanelle Bal, RD, LDN  Abdominal Transplant Dietitian   Pager: [phone]

## 2019-12-13 NOTE — Unmapped (Signed)
Neuro: Aox4. PERRL. MAE. Tremors at baseline. Anxiety. Generalized weakness.    Respiratory: SORA    Cardiac: WWP. Afebrile. +2 pitting edema in ankles. Blood pressures within limits throughout shift.     GI/GU: Decreased UOP. Bladder scan done ( ) - out with heat pack and running water. Multiple loose Bms. NPO as of 1200.     Social: Grandma at bedside throughout shift.

## 2019-12-14 LAB — CBC W/ AUTO DIFF
BASOPHILS ABSOLUTE COUNT: 0.1 10*9/L (ref 0.0–0.1)
BASOPHILS RELATIVE PERCENT: 0.6 %
EOSINOPHILS ABSOLUTE COUNT: 0 10*9/L (ref 0.0–0.4)
EOSINOPHILS RELATIVE PERCENT: 0.2 %
HEMOGLOBIN: 8.6 g/dL — ABNORMAL LOW (ref 13.5–17.5)
LARGE UNSTAINED CELLS: 1 % (ref 0–4)
LYMPHOCYTES ABSOLUTE COUNT: 0.4 10*9/L — ABNORMAL LOW (ref 1.5–5.0)
LYMPHOCYTES RELATIVE PERCENT: 3.3 %
MEAN CORPUSCULAR HEMOGLOBIN: 29.4 pg (ref 26.0–34.0)
MEAN CORPUSCULAR VOLUME: 89.3 fL (ref 80.0–100.0)
MEAN PLATELET VOLUME: 8.4 fL (ref 7.0–10.0)
MONOCYTES ABSOLUTE COUNT: 0.4 10*9/L (ref 0.2–0.8)
MONOCYTES RELATIVE PERCENT: 3.1 %
NEUTROPHILS ABSOLUTE COUNT: 10.4 10*9/L — ABNORMAL HIGH (ref 2.0–7.5)
NEUTROPHILS RELATIVE PERCENT: 92.2 %
PLATELET COUNT: 131 10*9/L — ABNORMAL LOW (ref 150–440)
RED BLOOD CELL COUNT: 2.92 10*12/L — ABNORMAL LOW (ref 4.50–5.90)
RED CELL DISTRIBUTION WIDTH: 23.8 % — ABNORMAL HIGH (ref 12.0–15.0)
WBC ADJUSTED: 11.2 10*9/L — ABNORMAL HIGH (ref 4.5–11.0)

## 2019-12-14 LAB — PROTIME: Coagulation tissue factor induced:Time:Pt:PPP:Qn:Coag: 21.1 — ABNORMAL HIGH

## 2019-12-14 LAB — COMPREHENSIVE METABOLIC PANEL
ALBUMIN: 2.4 g/dL — ABNORMAL LOW (ref 3.5–5.0)
ALKALINE PHOSPHATASE: 819 U/L — ABNORMAL HIGH (ref 65–260)
AST (SGOT): 130 U/L — ABNORMAL HIGH (ref 19–55)
BILIRUBIN TOTAL: 34.8 mg/dL — ABNORMAL HIGH (ref 0.0–1.2)
BLOOD UREA NITROGEN: 23 mg/dL — ABNORMAL HIGH (ref 7–21)
BUN / CREAT RATIO: 10
CALCIUM: 7.9 mg/dL — ABNORMAL LOW (ref 8.5–10.2)
CHLORIDE: 112 mmol/L — ABNORMAL HIGH (ref 98–107)
CREATININE: 2.4 mg/dL — ABNORMAL HIGH (ref 0.70–1.30)
EGFR CKD-EPI AA MALE: 44 mL/min/{1.73_m2} — ABNORMAL LOW (ref >=60)
EGFR CKD-EPI NON-AA MALE: 38 mL/min/{1.73_m2} — ABNORMAL LOW (ref >=60)
GLUCOSE RANDOM: 219 mg/dL — ABNORMAL HIGH (ref 70–179)
POTASSIUM: 2.7 mmol/L — ABNORMAL LOW (ref 3.5–5.0)
SODIUM: 137 mmol/L (ref 135–145)

## 2019-12-14 LAB — APTT
APTT: 58.2 s — ABNORMAL HIGH (ref 25.3–37.1)
Coagulation surface induced:Time:Pt:PPP:Qn:Coag: 58.2 — ABNORMAL HIGH

## 2019-12-14 LAB — FIBRINOGEN LEVEL: Fibrinogen:MCnc:Pt:PPP:Qn:Coag: 104 — ABNORMAL LOW

## 2019-12-14 LAB — ANION GAP: Anion gap 3:SCnc:Pt:Ser/Plas:Qn:: 0

## 2019-12-14 LAB — PROTIME-INR: INR: 1.82

## 2019-12-14 LAB — PHOSPHORUS: Phosphate:MCnc:Pt:Ser/Plas:Qn:: 4.6

## 2019-12-14 LAB — MAGNESIUM: Magnesium:MCnc:Pt:Ser/Plas:Qn:: 2

## 2019-12-14 LAB — TACROLIMUS, TROUGH: Lab: 2.8 — ABNORMAL LOW

## 2019-12-14 LAB — D-DIMER QUANTITATIVE (CH,ML,PD,ET): Fibrin D-dimer DDU:MCnc:Pt:PPP:Qn:: 517 — ABNORMAL HIGH

## 2019-12-14 LAB — VANCOMYCIN RANDOM: Vancomycin^random:MCnc:Pt:Ser/Plas:Qn:: 23.2

## 2019-12-14 LAB — HEMOGLOBIN
Hemoglobin:MCnc:Pt:Bld:Qn:: 8.1 — ABNORMAL LOW
Hemoglobin:MCnc:Pt:Bld:Qn:: 9 — ABNORMAL LOW

## 2019-12-14 LAB — PLATELET COUNT: Platelets:NCnc:Pt:Bld:Qn:Automated count: 131 — ABNORMAL LOW

## 2019-12-14 NOTE — Unmapped (Signed)
PICU to [facility] Transfer/Accept Note    Hospital Course: Chart reviewed including notes, studies and hospital course.  Briefly, [name] Gross is a 19 y.o. 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 Underlying etiology of his emesis was initially thought to be due to concern for SBP however EGD completed on 2/2 revealed significant candidal esophagitis found on EGD. No varices or evidence of GI bleed (history of varices s/p banding). Started on fluconazole 2/2. Adult hepatology following closely. AKI (most recent Cr 2.4 on 2/3) likely secondary to volume depletion from emesis, also worsened by tacrolimus and history of multiple previous episodes of kidney injury. Creatinine slightly worse today, per nephrology may be slow to improve. Will continue renally dosing medications and closely monitoring I/Os. Given his clinical improvement and stability patient was transferred to floor for further management.     Assessment/Plan:     Principal Problem:    AKI (acute kidney injury) (CMS-HCC)  Active Problems:    History of liver transplant (CMS-HCC)    Major depressive disorder    Liver transplant rejection (CMS-HCC)  Resolved Problems:    * No resolved hospital problems. *    Patient is clinically improving and ready for transfer to acute care floor on PMG service.    Candidal Esophagitis   - Confirmed on EGD 2/2 - No varices identified in setting of coffee-ground emesis   - Start fluconazole 2/2: Loading dose 400mg  followed by 100mg  daily   - S/p vancomycin, cefepime and flagyl due to initial concern for SBP which is now unlikely    - Peritoneal cultures 1/31 - pending     H/o acute on chronic liver rejection s/p liver transplant 2017  - Peds GI and adult hepatology following   - History of cryptogenic cirrhosis of unclear etiology requiring transplant   - Holding tacrolimus and bactrim prophylaxis in setting of AKI  - Spironolactone 50mg  daily  - Mycophenolate BID  - Rifaximin 550mg  BID  - Valganciclovir daily   - Start prednisone 20mg  daily;    - s/p stress dosing in PICU     Acute Kidney Injury  - Peds nephrology following, appreciate recs    - Likely due to volume depletion in the setting of coffee ground emesis and tacrolimus use    - Renal U/S completed 2/3 - Final read pending   - Concern for ATN (history of prior AKI and hypertension)   - Creatinine trend: 2/2 - 2.2, 2/3 - 2.4 (prior to admission, creatinine 1.07-1.78)  - Medications are renally-dosed   - History of decreased SBP to 80's likely due to fluid depletion; stable since transfer     FEN/GI  - Strict I/O  - 3/4 mIVF w/ 1/2 NS + KCl (additional KCl oral supplementation)   - TFG ~66mL including enteral feeds and IVF   - Continuous feeds at 57ml/hr with goal of 3ml/hr    - Vital AF increasing by 10 ml/hr q4hr as tolerated   - Flush min of 30 ml q6hr to maintain patency of tube   - Low sodium diet ordered (continuous feeds due to recurrent emesis)   - IV Zofran 4mg  q8h PRN  - IV Protonix 40mg  BID  - Continue home aquadeks, mag-ox, vit D3    Heme  - 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 Patient is currently clinically stable on floor and VSS. Increased NG feeds to 56ml/hr however patient acknowledges continued discomfort of volume of feeds however no pain. Objective:     Vital signs in last 24 hours:  Temp:  [36 ??C-36.6 ??C] 36.4 ??C  Heart Rate:  [68-96] 85  SpO2 Pulse:  [68-171] 87  Resp:  [16-29] 25  BP: (84-107)/(37-70) 99/64  MAP (mmHg):  [53-81] 75  A BP-2: (90-124)/(44-77) 124/69  MAP:  [58 mmHg-90 mmHg] 82 mmHg  SpO2:  [99 %-100 %] 100 %  Vitals:    12/11/19 0200   Weight: 52.9 kg (116 lb 10 oz)         Intake/Output last 3 shifts:  I/O last 3 completed shifts:  In: 4679.7 [P.O.:240; I.V.:3159.7; NG/GT:380; IV Piggyback:900]  Out: 1275 [Urine:875; Stool:400]    Physical Exam:  General: Alert, lying in bed, in no acute distress; cooperative with exam  Head:  normocephalic, no masses, lesions, tenderness or abnormalities; facial acne present  Eyes: Scleral icterus present; pupils equal, round, reactive to light, conjunctiva clear, extraocular movements intact  Nose: Clear, no discharge, NG tube in place  Neck:  Full range of motion, supple, mild, non-tender L submandibular swelling present  Lungs: Clear to auscultation, no wheezing, crackles or rhonchi, breathing unlabored  Heart: Normal PMI. regular rate and rhythm, normal S1, S2, no murmurs or gallops.  Abdomen: Prior surgical scars present; Abdomen soft, non-tender. BS normal. Exam limited due to abdominal discomfort  Neuro: No focal findings  Extremities:  2+ pedal edema present bilaterally; moves all extremities equally and sensation intact  Skin: Skin color, texture and turgor are normal; no bruising, rashes or lesions noted    Studies: Personally reviewed and interpreted.    Labs:   Lab Results   Component Value Date    WBC 11.2 (H) 12/14/2019    HGB 8.1 (L) 12/14/2019    HCT 25.3 (L) 12/14/2019    PLT 131 (L) 12/14/2019       Lab Results   Component Value Date    NA 137 12/14/2019    K 2.7 (L) 12/14/2019    CL 112 (H) 12/14/2019    CO2  12/14/2019      Comment:      Specimen icteric.      BUN 23 (H) 12/14/2019    CREATININE 2.40 (H) 12/14/2019    GLU 219 (H) 12/14/2019    CALCIUM 7.9 (L) 12/14/2019    MG 2.0 12/14/2019    PHOS 4.6 12/14/2019       Lab Results   Component Value Date    BILITOT 34.8 (H) 12/14/2019    BILIDIR 28.30 (H) 12/12/2019    PROT  12/14/2019      Comment:      Specimen icteric.    ALBUMIN 2.4 (L) 12/14/2019    ALT  12/14/2019      Comment:      Specimen icteric.  AST 130 (H) 12/14/2019    ALKPHOS 819 (H) 12/14/2019    GGT 256 (H) 12/11/2019       Lab Results   Component Value Date    PT 21.1 (H) 12/14/2019    INR 1.82 12/14/2019    APTT 58.2 (H) 12/14/2019     Imaging:   US Renal Complete   Final Result   Poorly visualized right kidney on current exam. No hydronephrosis. Echogenic renal parenchyma similar to prior which can be seen with medical renal disease.               XR Abdomen Portable   Final Result   -Weighted enteric tube with tip now in the gastric body. If ultimate goal is placement of weighted enteric tube in the small bowel, then would recommend advancing 10 cm.      XR Abdomen 1 View   Final Result   -Weighted enteric tube projects over the mid esophagus. Recommend replacing weighted enteric tube.      The findings of this study were discussed via telephone with DR. Lorenso Quarry by Dr. Lucia Estelle on 12/13/2019 5:45 PM.      US Liver Transplant   Final Result   - Patent hepatic transplant vasculature.   - Interval increase in resistive indices noted throughout the hepatic arteries which are within upper limits of normal/minimally elevated, as above.   - Increased diameter common bile duct. No stone identified.   - Heterogeneous liver echotexture, which is somewhat nonspecific, however, can be seen in setting of underlying liver disease. Slight interval decrease in liver measurement with increased size spleen.   - Large-volume abdominopelvic ascites, similar to prior.   - Bilateral echogenic kidneys, nonspecific, however, can be seen with chronic medical renal disease.            XR Chest Portable   Final Result   Elevated left hemidiaphragm.   Question catheter versus artifact overlying the right axilla.        =======================================================  Tora Duck, MD, MPH  Peds Intern, PGY-1  Pager: [phone]  December 14, 2019   4:35 PM        I performed a history and physical examination of the patient and discussed the patient's management with the family and the resident. I personally reviewed the results of all tests, including blood work and images. I reviewed and edited the resident's note and agree with the documented findings and plan of care. I provided direct supervision to the care of the patient. I spent over 50% of a total 50 min counseling and/or coordinating the care of this patient.     Rocco Pauls, MD  Pediatric Gastroenterology

## 2019-12-14 NOTE — Unmapped (Signed)
ICU Nightly Progress Note    Interval events:  EGD today remarkable for candida esophagitis but no varices.  NG placed by GI.  Placement confirmed with xray.      LOS: 2 days    Assessment: [name] Gross is a 19 y.o. 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      Plan:   - Continues on stress dose steroids with improved pressures, further albumin as needed  - saturations stable in RA  - clears as tolerated. On IV protonix and home meds.  - Remains on broad antimicrobial coverage with flagyl, and cefepime.  Fluconazole added today for esophagitis.    Vitals:  Dosing weight: 52.9 kg (116 lb 10 oz) (12/11/19 0456)  Most recent weight: 52.9 kg (116 lb 10 oz) (12/11/19 0200)    Temp:  [36 ??C (96.8 ??F)-36.5 ??C (97.7 ??F)] 36.3 ??C (97.3 ??F)  Heart Rate:  [68-88] 76  SpO2 Pulse:  [68-89] 74  Resp:  [16-28] 28  BP: (84-118)/(37-76) 84/37  MAP (mmHg):  [53-89] 53  A BP-2: (83-140)/(44-86) 96/47  MAP:  [58 mmHg-104 mmHg] 61 mmHg  SpO2:  [99 %-100 %] 100 %     I/O       02/01 0701 - 02/02 0700 02/02 0701 - 02/03 0700    I.V. (mL/kg) 2412 (45.6) 1088 (20.6)    Blood      IV Piggyback 700 100    Total Intake 3112 1188    Urine (mL/kg/hr) 400 (0.3)     Stool 550     Total Output(mL/kg) 950 (18)     Net +2162 +1188          Urine Occurrence 1 x     Stool Occurrence 4 x            Physical Exam:  General: no distress. Baseline tremor.  Asking for ice chips  HEENT: scleral icterus. MMM.  NG tube in place.  CV: no murmur. 2+ distal pulses  Resp: clear bilaterally with good air movement  Abd: + bs. Full but soft.   Ext: thin. + edema  Neuro: answers questions appropriately, but somewhat confused about his NG tube     Labs and studies reviewed.     Lines/Tubes:   Patient Lines/Drains/Airways Status    Active Active Lines, Drains, & Airways     Name:   Placement date:   Placement time:   Site:   Days:    Peripheral IV 09/30/19 Left;Posterior Forearm 09/30/19    0958    Forearm   74    Arterial Line 12/11/19 Left Radial   12/11/19    1745    Radial   2    Arterial Infusion Catheter 12/11/19 2.5 2.5 cm Non-tunneled   12/11/19    1745     2                  PEDIATRIC ICU ATTENDING ATTESTATION     The patient was critically ill during the time that I saw the patient. The Critical Care Time excluding procedures was 30 minutes.     Lottie Mussel, MD  12/13/2019  8:43 PM

## 2019-12-14 NOTE — Unmapped (Signed)
PHYSICAL THERAPY  Evaluation (12/14/19 1002)     Patient Name:  [name] Gross       Medical Record Number: [id number]   Date of Birth: Feb 03, 2001  Sex: Male            Treatment Diagnosis: [name] Gross is a 19 y.o. 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  He presents with decreased strength, decreased endurance, decreased tolerance for mobility and high risk for further deconditioning and compromised skin integrity.  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, admitted for concerns of bacterial peritonitis, [name] DVT   Examination of Body System: 3-5 elements   Body System: neuromuscular, cardiopulmonary, musculoskeletal, activity/participation   Clinical Decision Making: High     PLAN  Planned Frequency of Treatment:  1x per day for: 4-5x week Planned Treatment Duration: until goals met    Planned Interventions: Airway Clearance, Balance activities, Education - Family / caregiver, Education - Patient, Endurance activities, Home exercise program, Gait training, Postural re-education, Self-care / Home training, Stair training, Therapeutic exercise, Therapeutic activity, Transfer training    Post-Discharge Physical Therapy Recommendations:  3x weekly    PT DME Recommendations: (patient has appropriate equipment at home)           Goals:   Patient and Family Goals: I just want to go home    Long Term Goal #1: Patient will return to indep mobility in home without AD       SHORT GOAL #1: Patient will demonstrate bed mobility with supervision only              Time Frame : 2 weeks  SHORT GOAL #2: Patient will transfer sit <> stand with RW with supervision only 3 of 5 trials              Time Frame : 2 weeks  SHORT GOAL #3: Patient will ambulate 150 feet with RW with supervision              Time Frame : 2 weeks  SHORT GOAL #4: Patient will asc/ desc 10 steps with rail with CGA              Time Frame : 2 weeks                      Prognosis:             SUBJECTIVE  Patient reports: RN and mother agreeable to PT, [name] Gross initially agreeable however declined OOB despite encouragement  Current Functional Status: no restrictions  Services patient receives: PT  Prior Functional Status: Patient reports ambulation with walker primarily- able to manage stairs with railing however slower and more difficult  Equipment available at home: Shower chair, Agricultural consultant     Past Medical History:   Diagnosis Date   ??? Acute rejection of liver transplant (CMS-HCC) 08/20/2017  Moderate acute cellular rejection with prominent centrilobular venulitis, RAI = 7/9 (portal inflammation: 3, bile duct inflammation/damage: 1, venous endothelial inflammation: 3)   ??? Depression    ??? MSSA bacteremia 08/27/2019   ??? Seasonal allergies    ??? Sickle cell trait (CMS-HCC)    ??? Strep Mitis Central line-associated bloodstream infection 08/27/2019    Social History     Tobacco Use   ??? Smoking status: Never Smoker   ??? Smokeless tobacco: Never Used   ??? Tobacco comment: Step-father smokes outside   Substance Use Topics   ??? Alcohol use: Never     Alcohol/week: 0.0 standard drinks     Frequency: Never     Comment: 1/2 glass of wine on rare occasions      Past Surgical History:   Procedure Laterality Date   ??? FRENULECTOMY, LINGUAL     ??? PR ERCP REMOVE FOREIGN BODY/STENT BILIARY/PANC DUCT N/A 08/20/2017 Procedure: ENDOSCOPIC RETROGRADE CHOLANGIOPANCREATOGRAPHY (ERCP); W/ REMOVAL OF FOREIGN BODY/STENT FROM BILIARY/PANCREATIC DUCT(S);  Surgeon: Vonda Antigua, MD;  Location: [facility]URES [facility];  Service: Gastroenterology   ??? PR ERCP REMOVE FOREIGN BODY/STENT BILIARY/PANC DUCT N/A 06/04/2018    Procedure: ENDOSCOPIC RETROGRADE CHOLANGIOPANCREATOGRAPHY (ERCP); W/ REMOVAL OF FOREIGN BODY/STENT FROM BILIARY/PANCREATIC DUCT(S);  Surgeon: Chriss Driver, MD;  Location: GI PROC[facility]URES [facility];  Service: Gastroenterology   ??? PR ERCP STENT PLACEMENT BILIARY/PANCREATIC DUCT N/A 11/21/2016    Procedure: ENDOSCOPIC RETROGRADE CHOLANGIOPANCREATOGRAPHY (ERCP); WITH PLACEMENT OF ENDOSCOPIC STENT INTO BILIARY OR PANCREATIC DUCT;  Surgeon: Vonda Antigua, MD;  Location: GI PROC[facility]URES [facility];  Service: Gastroenterology   ??? PR ERCP,W/REMOVAL STONE,BIL/PANCR DUCTS N/A 08/20/2017    Procedure: ERCP; W/ENDOSCOPIC RETROGRADE REMOVAL OF CALCULUS/CALCULI FROM BILIARY &/OR PANCREATIC DUCTS;  Surgeon: Vonda Antigua, MD;  Location: GI PROCEDURES [facility];  Service: Gastroenterology   ??? PR ERCP,W/REMOVAL STONE,BIL/PANCR DUCTS N/A 06/04/2018    Procedure: ERCP; W/ENDOSCOPIC RETROGRADE REMOVAL OF CALCULUS/CALCULI FROM BILIARY &/OR PANCREATIC DUCTS;  Surgeon: Chriss Driver, MD;  Location: GI [facility]URES [facility];  Service: Gastroenterology   ??? PR TRANSPLANT LIVER,ALLOTRANSPLANT N/A 09/22/2016    Procedure: LIVER ALLOTRANSPLANTATION; ORTHOTOPIC, PARTIAL OR WHOLE, FROM CADAVER OR LIVING DONOR, ANY AGE;  Surgeon: Doyce Loose, MD;  Location: MAIN OR [facility];  Service: Transplant   ??? PR TRANSPLANT,PREP DONOR LIVER, WHOLE N/A 09/22/2016    Procedure: [facility] STD PREP CAD DONOR WHOLE LIVER GFT PRIOR TNSPLNT,INC CHOLE,DISS/REM SURR TISSU WO TRISEG/LOBE SPLT;  Surgeon: Doyce Loose, MD;  Location: MAIN OR [facility];  Service: Transplant   ??? PR UPGI ENDOSCOPY W/US FN BX N/A 08/20/2017 Procedure: UGI W/ TRANSENDOSCOPIC ULTRASOUND GUIDED INTRAMURAL/TRANSMURAL FINE NEEDLE ASPIRATION/BIOPSY(S), ESOPHAGUS;  Surgeon: Vonda Antigua, MD;  Location: GI [facility]URES [facility];  Service: Gastroenterology   ??? PR UPPER GI ENDOSCOPY,BIOPSY N/A 07/15/2016    Procedure: UGI ENDOSCOPY; WITH BIOPSY, SINGLE OR MULTIPLE;  Surgeon: Arnold Long Mir, MD;  Location: PEDS PROCEDURE ROOM [facility];  Service: Gastroenterology   ??? PR UPPER GI ENDOSCOPY,CTRL BLEED Left 05/05/2016    Procedure: UGI ENDOSCOPY; WITH CONTROL OF BLEEDING, ANY METHOD;  Surgeon: Arnold Long Mir, MD;  Location: PEDS PROCEDURE ROOM [facility];  Service: Gastroenterology   ??? PR UPPER GI ENDOSCOPY,CTRL BLEED N/A 05/12/2016    Procedure: UGI ENDOSCOPY; WITH CONTROL OF BLEEDING, ANY METHOD;  Surgeon: Annie Paras, MD;  Location: CHILDRENS OR [facility];  Service: Gastroenterology   ??? PR UPPER GI ENDOSCOPY,DIAGNOSIS N/A 09/21/2019    Procedure: UGI ENDO, INCLUDE ESOPHAGUS, STOMACH, & DUODENUM &/  OR JEJUNUM; DX W/WO COLLECTION SPECIMN, BY BRUSH OR WASH;  Surgeon: Monte Fantasia, MD;  Location: [facility]URES [name];  Service: Gastroenterology    Family History   Problem Relation Age of Onset   ??? Diabetes Maternal Grandmother    ??? Diabetes Paternal Grandmother    ??? Hypertension Maternal Grandfather    ??? Hypertension Paternal Grandfather    ??? Asthma Brother    ??? Anesthesia problems Neg Hx    ??? Melanoma Neg Hx    ??? Basal cell carcinoma Neg Hx    ??? Squamous cell carcinoma Neg Hx         Allergies: Bee pollen and Pollen extracts                Objective Findings  Precautions / Restrictions  Precautions: Non-applicable  Weight Bearing Status: Non-applicable  Required Braces or Orthoses: Non-applicable    Communication Preference: Verbal   Pain Comments: reports everything hurts  no rating given, RN aware I am having a bad day  Medical Tests / Procedures: EMR reviewed  Equipment / Environment: NGT, Vascular access (PIV, TLC, Port-a-cath, PICC), Telemetry    At Rest: VSS  With Activity: minimal activity - VSS during ther ex          Living Situation  Living Environment: House  Lives With: Grandparent  Home Living: Stairs to enter with rails, Shower chair with back, Two level home, Stairs to alternate level with rails  Rail placement (outside): Bilateral rails  Number of Stairs: 10     Cognition: Alert & oriented, age-appropriate  Visual / Perception Status: in tact, jaundice noted  Skin Inspection: significant edema in feet > hands    UE ROM: WFL  UE Strength: WFL, moves against gravity however overall decreased activity with reports of discomfort everywhere  LE ROM: WFL  LE Strength: Decreased strength noted with limited antigravity movement hips and knees                       Sensation: denies numbness or tingling  Balance: deferred due to patient declining OOB   Posture: sitting in bed with hips and knees flexed and unsupported, head flexed off bed surface initially.  Repositioned to support LEs and elevate feet for swelling and pressure relief.  Pillows adjusted to allow head to rest back in neutral  Other: ankle pumps, SAQ, heel slides, bridging for positioning, abduction gravity eliminated  Bed Mobility: deferred - able to adjust self in bed with bridging however increased effort and time  Transfers: deferred due to patient declining   Gait  Gait: patient declined despite efforts to mobilize              Physical Therapy Session Duration  PT Individual - Duration: 30    Medical Staff Made Aware: Clinton Sawyer aware - recommend OOB later with RN assist    I attest that I have reviewed the above information.  Signed: Jordan Likes, PT  Filed 12/14/2019

## 2019-12-14 NOTE — Unmapped (Signed)
Pediatric Tacrolimus Therapeutic Monitoring Pharmacy Note    [name] Gross is a 19 y.o. male continuing tacrolimus.     Indication: Liver transplant     Date of Transplant: 09/22/2016      Prior Dosing Information: Current regimen ON HOLD. Last dose 1 mg reportedly given on 1/29 @ ~0800. Most recent home regimen tacrolimus 1 mg (0.02 mg/kg) PO every 12 hours       Dosing Weight: 52.9 kg    Goals:  Therapeutic Drug Levels  Tacrolimus random goal: <10 ng/mL    Additional Clinical Monitoring/Outcomes  ?? Monitor renal function (SCr and urine output) and liver function (LFTs)  ?? Monitor for signs/symptoms of adverse events (e.g., hyperglycemia, hyperkalemia, hypomagnesemia, hypertension, headache, tremor)    Results:   Tacrolimus level: 2.8 ng/mL, drawn appropriately (120 hour level)    Longitudinal Dose Monitoring:  Date Dose (mg), Route Level  (ng/mL) AM Scr (mg/dL) Key Drug Interactions   12/14/19 NONE 2.8 2.4 Fluconazole     Pharmacokinetic Considerations and Significant Drug Interactions:  ? Concurrent hepatotoxic medications: fluconazole, metronidazole  ? Concurrent CYP3A4 substrates/inhibitors: fluconazole  ? Concurrent nephrotoxic medications: fluconazole (renally dose adjusted), metronidazole, spironolactone, valganciclovir ppx (renally dose adjusted)    Assessment/Plan:  Today's level was collected appropriately and 5 days after the last reported dose. The level was to assess for toxicity in the setting of AKI. The resulting level was below the threshold goal but was detectable. Therefore, his level on admission was most likely supratherapeutic.     Recommendedation(s)  Continue to hold tacrolimus per Pediatric GI         Follow-up  ? No further levels ordered at this time. Will continue to assess ability to restart based on SCr.  ? A pharmacist will continue to monitor and recommend levels as appropriate    The above plan was discussed with Earl Lagos, MD.    Please page service pharmacist with questions/clarifications.    Lars Pinks, PharmD   Pediatric Clinical Pharmacist  [phone] (pager)  [phone] (pediatric pharmacy)

## 2019-12-14 NOTE — Unmapped (Addendum)
Pediatric Nephrology   Consult Note     Requesting Attending Physician :  [name] Old, MD  Service Requesting Consult : Ped Gastroenterology [facility])    Reason for Consult: AKI    Problem List:     Problem List     None          Assessment and Plan:     [name] Gross is a 19 y.o. 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 His AKI was likely worsened by tacrolimus (vasoconstrictive) and his history of multiple previous episodes of kidney injury. Urine microscopy with bile stained muddy brown casts, consistent with ATN (pictured below). His renal ultrasound was unremarkable.    Pediatric nephrology has consulted on [name] Gross in the past for AKI and HTN. He has a history of multiple episodes of AKI which may make his recovery from this episode of ATN slower than one would expect with healthy underlying kidneys. His creatinine continues to increase, however will most likely gradually improve. In the meantime, please follow his urine output closely. If he remains oliguric can consider changing his IVF to insensibles plus 1:1 urine replacement to avoid fluid overload. Alternatively, can estimate a PO + IV goal as below.     Overall, expect graduate improvement of his AKI in the coming days.     Recommendations:  - Agree with transitioning to oral intake  - monitor I/Os very closely, he is at risk for post AKI diuresis  - goal net even if possible  - if IVF needed, consider replacing insensible losses plus 1:1 replacement of urine output, OR  Can aim for total PO + IVF ins of 1-1.2L/24 hours  - Need to carefully consider timing of re-initiation of tac given nephrotoxicity and risk of rejection.   - Avoid additional nephrotoxicic agents as able  - We will continue to follow along with you       I saw and evaluated the patient, participating in the key portions of the service.?? I reviewed the resident???s note.?? I agree with the resident???s findings and plan.   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 Transferred from OSH due to decreased PO intake and vomiting x 2 weeks. Emesis recently turned darker brown in appearance. No urine output x 24 hours prior to admission. At OSH, initial blood pressures of 60s/40s, HR 90s.  Na 132, K 2.9, Cl 102, BUN 26, Cr 2.7 Received 60 ml/kg of volume resuscitation prior to arrival at [facility]. Home meds include tacrolimus, valcyte,  MMF.     Over the last 24 hours, had 475 ml of UOP  FENa 1.8% and FEurea 61% on day of admission.     Creatinine       1.8   on 11/5    1.07 on 12/10    1.78 on 12/29      2.2 on 2/2     2.4 on 2/3             has a past medical history of Acute rejection of liver transplant (CMS-HCC) (08/20/2017); Depression; Seasonal allergies; and Sickle cell trait (  CMS-HCC).    Review of Systems: ten systems reviewed and negative but for that noted in HPI    Medications:     Current Facility-Administered Medications   Medication Dose Route Frequency Provider Last Rate Last Admin   ??? albumin human 25 % bottle 25 g  25 g Intravenous BID [name] Stall, MD   25 g at 12/14/19 1359   ??? benzoyl peroxide 5 % gel   Topical Nightly [name] Stall, MD       ??? cefepime (MAXIPIME) 2,000 mg in sodium chloride 0.9 % (NS) 100 mL IVPB-connector bag  50 mg/kg (Dosing Weight) Intravenous Q24H [name] Stall, MD   Stopped at 12/14/19 0031   ??? cholecalciferol (vitamin D3) tablet 5,000 Units  5,000 Units Oral Daily [name] Stall, MD   5,000 Units at 12/14/19 0920   ??? clindamycin (CLEOCIN T) 1 % external solution   Topical Nightly [name] Stall, MD       ??? fluconazole (DIFLUCAN) tablet 100 mg  100 mg Oral Daily [name] Stall, MD   100 mg at 12/14/19 1610   ??? fluticasone propionate (FLOVENT HFA) 220 mcg/actuation inhaler 1 puff  1 puff Oral BID (RT) [name] Stall, MD       ??? heparin 1,000 units/500 mL (2 units/mL) in 0.9% NaCl infusion  3 mL/hr Intravenous Continuous [name] Stall, MD 3 mL/hr at 12/14/19 1100 3 mL/hr at 12/14/19 1100   ??? lactated Ringers infusion  0-90 mL/hr Intravenous Continuous [name] Stall, MD 45 mL/hr at 12/14/19 1120 45 mL/hr at 12/14/19 1120   ??? magnesium oxide (MAG-OX) tablet 400 mg  400 mg Oral BID [name] Stall, MD   400 mg at 12/14/19 0804   ??? metroNIDAZOLE (FLAGYL) IVPB 500 mg  500 mg Intravenous [facility] [name] Stall, MD   Stopped at 12/14/19 [phone]   ??? multivitamin, with zinc (AQUADEKS) chewable tablet  2 tablet Oral Daily [name] Stall, MD   2 tablet at 12/14/19 5409   ??? mycophenolate (MYFORTIC) EC tablet 540 mg  540 mg Oral BID [name] Stall, MD   540 mg at 12/14/19 8119   ??? ondansetron (ZOFRAN) injection 4 mg  4 mg Intravenous Q8H PRN [name] Stall, MD   4 mg at 12/12/19 0128   ??? pantoprazole (PROTONIX) EC tablet 40 mg  40 mg Oral BID [name] Loma Messing, MD       ??? predniSONE (DELTASONE) tablet 15 mg  15 mg Oral Daily [name] Stall, MD   15 mg at 12/14/19 1355   ??? rifAXIMin (XIFAXAN) tablet 550 mg  550 mg Oral BID [name] Stall, MD   550 mg at 12/14/19 1478   ??? spironolactone (ALDACTONE) tablet 50 mg  50 mg Oral Daily [name] Stall, MD   50 mg at 12/14/19 0920   ??? [START ON 12/15/2019] valGANciclovir (VALCYTE) tablet 450 mg  450 mg Oral Q48H [name] Stall, MD           Allergies:     Allergies   Allergen Reactions   ??? Bee Pollen Rash     Pt has seasonal allergies that cause excessive sneezing and running nose- [name] Gross, NAII   ??? Pollen Extracts Rash     Pt has seasonal allergies that cause excessive sneezing and running nose- [name] Gross       Past Medical History:     Past Medical History:   Diagnosis Date   ???  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 Depression    ??? MSSA bacteremia 08/27/2019   ??? Seasonal allergies    ??? Sickle cell trait (CMS-HCC)    ??? Strep Mitis Central line-associated bloodstream infection 08/27/2019       Family History:     Family History   Problem Relation Age of Onset   ??? Diabetes Maternal Grandmother    ??? Diabetes Paternal Grandmother    ??? Hypertension Maternal Grandfather    ??? Hypertension Paternal Grandfather    ??? Asthma Brother    ??? Anesthesia problems Neg Hx    ??? Melanoma Neg Hx    ??? Basal cell carcinoma Neg Hx    ??? Squamous cell carcinoma Neg Hx        Social History:     Social History     Social History Narrative    Lives part time with mother and part time with MGM.        Objective:     BP 99/64  - Pulse 85  - Temp 36.4 ??C (Oral)  - Resp 25  - Ht 170.2 cm (5' 7.01)  - Wt 52.9 kg (116 lb 10 oz)  - SpO2 100%  - BMI 18.26 kg/m??   2 %ile (Z= -2.03) based on CDC (Boys, 2-20 Years) weight-for-age data using vitals from 12/11/2019.  18 %ile (Z= -0.92) based on CDC (Boys, 2-20 Years) Stature-for-age data based on Stature recorded on 12/11/2019.    General Appearance:  Awake and alert, taking sips with meds  HEENT: MMM, NGT in place  Chest:  No increased work of breathing  Heart:  RRR, appears perfused  Neuro: awake, moves extremities, answers questions, appears tremulous    Labs:   No results displayed because visit has over 200 results.          Jodelle Gross. Pritt, MD  [facility] Pediatric[facility], PGY3  Pager: [phone]

## 2019-12-14 NOTE — Unmapped (Signed)
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 for further evaluation of end-stage liver disease.    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 Overall clinical decline likely due to worsening hepatic dysfunction in the setting of chronic rejection, which has been determined to be related to medication nonadherence.    Seen in consultation at the request of Dr. Morrison Old, MD for cirrhosis    Interval History:  - corpak placed yesterday, tolerating tube feeds at 40/hr   - EGD showed significant esophagitis, but no varices  - pt c/o distended abdomen, and abdomen feeling tight      ASSESSMENT / PLAN:     #Concern for UGIB: EGD without varices, no e/o significant GI bleed. Had significant esophagitis, now on fluconazole, renally dosed.   --ok with checking hgb just once daily  --no e/o UGIB on EGD, did have bad esophagitis, which may have caused some oozing       #AKI: Suspect AKI could be related to hypovolemia in the setting of vomiting and poor p.o. intake, could be related to infection, though infectious work-up has been unrevealing.  -appreciate nephrology's input  -serum albumin low, would give 25 grams albumin 25% BID  -patient has some ascites and had been c/o distension. I did bedside u/s, and there was only small volume ascites. Given his rising creatinine, do not feel a therapeutic para is warranted at this time. I suspect much of his distension is 2/2 air from EGD and starting tube feeds. Continue to monitor    #S/p OLT 2017, now w/chronic rejection: Last liver biopsy in October 2020 showed??ductopenia suggestive of chronic rejection, severe hepatocanalicular cholestasis, along with perivenular, periportal and??portal fibrosis.?? Overall clinical decline likely due to worsening hepatic dysfunction in the setting of chronic rejection, which has been determined to be related to medication nonadherence.  -Tacrolimus being held in the setting of renal dysfunction  -We will monitor for kidney improvement and assess when to restart, appreciate pediatric GI involvement.  -In December was deemed not appropriate transplant candidate due to poor nutrition and frailty. Discussed at our transplant conference today. Psychology and SW to meet again with patient. Will also do frailty assessment. When evaluated by the surgeons at last clinic visit, they felt he was too frail to undergo transplant. Transplant dietician seeing patient, and we will reasses his nutrition and frailty to assess his suitability for transplant.    MELD-Na score: 35 at 12/14/2019  5:23 AM  MELD score: 35 at 12/14/2019  5:23 AM  Calculated from:  Serum Creatinine: 2.40 mg/dL at 12/20/5619  3:08 AM  Serum Sodium: 137 mmol/L at 12/14/2019  5:23 AM  Total Bilirubin: 34.8 mg/dL at 04/14/7845  9:62 AM  INR(ratio): 1.82 at 12/14/2019  5:23 AM  Age: 19 years 8 months      Thank you for this consult.  Please page [phone] with questions.    Ace Gins, NP-C  Transplant, Hepatology           Medications:  Current Facility-Administered Medications   Medication Dose Route Frequency  Provider Last Rate Last Admin   ??? benzoyl peroxide 5 % gel   Topical Nightly Alfonse Ras, MD       ??? cefepime (MAXIPIME) 2,000 mg in sodium chloride 0.9 % (NS) 100 mL IVPB-connector bag  50 mg/kg (Dosing Weight) Intravenous Q24H Ether Griffins, MD   Stopped at 12/14/19 0031   ??? cholecalciferol (vitamin D3) tablet 5,000 Units  5,000 Units Oral Daily Alecia Lemming, MD   5,000 Units at 12/14/19 0920   ??? clindamycin (CLEOCIN T) 1 % external solution   Topical Nightly Alfonse Ras, MD       ??? fluconazole (DIFLUCAN) tablet 100 mg  100 mg Oral Daily Alfonse Ras, MD   100 mg at 12/14/19 1610   ??? fluticasone propionate (FLOVENT HFA) 220 mcg/actuation inhaler 1 puff  1 puff Oral BID (RT) [place] E Reasor, MD       ??? heparin 1,000 units/500 mL (2 units/mL) in 0.9% NaCl infusion  3 mL/hr Intravenous Continuous Alecia Lemming, MD 3 mL/hr at 12/14/19 0700 3 mL/hr at 12/14/19 0700   ??? hydrocortisone sod succ (Solu-CORTEF) injection 20 mg  50 mg/m2/day (Dosing Weight) Intravenous Q6H Ether Griffins, MD   20 mg at 12/14/19 0513   ??? lactated Ringers infusion  0-95 mL/hr Intravenous Continuous Alfonse Ras, MD 45 mL/hr at 12/14/19 0929 45 mL/hr at 12/14/19 0929   ??? magnesium oxide (MAG-OX) tablet 400 mg  400 mg Oral BID Alecia Lemming, MD   400 mg at 12/14/19 9604   ??? metroNIDAZOLE (FLAGYL) IVPB 500 mg  500 mg Intravenous [facility] Elesa Hacker, MD   Stopped at 12/14/19 [phone]   ??? multivitamin, with zinc (AQUADEKS) chewable tablet  2 tablet Oral Daily Alecia Lemming, MD   2 tablet at 12/14/19 8119   ??? mycophenolate (MYFORTIC) EC tablet 540 mg  540 mg Oral BID Alfonse Ras, MD   540 mg at 12/14/19 0803   ??? ondansetron (ZOFRAN) injection 4 mg  4 mg Intravenous Q8H PRN Alecia Lemming, MD   4 mg at 12/12/19 0128   ??? pantoprazole (PROTONIX) injection 40 mg  40 mg Intravenous BID Alecia Lemming, MD   40 mg at 12/14/19 0513   ??? rifAXIMin (XIFAXAN) tablet 550 mg  550 mg Oral BID Alecia Lemming, MD   550 mg at 12/14/19 1478   ??? spironolactone (ALDACTONE) tablet 50 mg  50 mg Oral Daily Alecia Lemming, MD   50 mg at 12/14/19 0920   ??? [START ON 12/15/2019] valGANciclovir (VALCYTE) tablet 450 mg  450 mg Oral Q48H Alfonse Ras, MD           Vital Signs:  Temp:  [36 ??C (96.8 ??F)-36.4 ??C (97.5 ??F)] 36.4 ??C (97.5 ??F)  Heart Rate:  [68-96] 83  SpO2 Pulse:  [68-171] 83  Resp:  [16-29] 29  BP: (84-113)/(37-76) 107/66  MAP (mmHg):  [53-89] 78  A BP-2: (90-119)/(44-77) 118/67  MAP:  [58 mmHg-90 mmHg] 80 mmHg  SpO2:  [99 %-100 %] 100 %    Intake/Output last 3 shifts:  I/O last 3 completed shifts:  In: 4679.7 [P.O.:240; I.V.:3159.7; NG/GT:380; IV Piggyback:900]  Out: 1275 [Urine:875; Stool:400]    Physical Exam:  General appearance: Laying in bed, alert and oriented, interactive  HEENT: Icteric sclera  Cardiovascular: Regular rate  Pulmonary: Normal work of breathing. Acyanotic  Abdominal: soft, mildly tender throughout, mildly distended, no hepatomegaly, bedside US without significant ascites  Musculoskeletal: + temporal  wasting. Normal joints of the hand.  Skin: + jaundice. No rashes.  Neurologic: Alert, oriented, and appropriate. Bilateral hand tremor  Psychiatric: Anxious/irritable.    Diagnostic Studies:   Labs:  Recent Labs     12/12/19  0332 12/12/19  0332 12/13/19  0414 12/13/19  0613 12/13/19  0613 12/13/19  2342 12/14/19  0523 12/14/19  0817   WBC 8.9  --   --  9.5  --   --  11.2*  --    HGB 8.2*   < >  --  7.5*   < > 9.0* 8.6* 8.1*   HCT 23.6*   < >  --  22.8*   < > 28.2* 26.1* 25.3*   PLT 124*  --  116* 122*  --   --  131*  --     < > = values in this interval not displayed.     Recent Labs     12/12/19  0332 12/13/19  0613 12/14/19  0523   NA 133* 135 137   K 3.5 3.3* 2.7*   CL 109* 111* 112*   BUN 18 22* 23*   CREATININE 1.90* 2.23* 2.40*   GLU 147* 144 219*     Recent Labs     12/12/19  0332 12/13/19  0613 12/14/19  0523   PROT 4.6* - 4.6*  --   --    ALBUMIN 2.4* - 2.4* 2.6* 2.4*   AST 203* - 203* 141* 130*   ALT 142* - 142*  --   --    ALKPHOS 1,054* - 1,054* 804* 819*   BILITOT 30.8* - 30.8*  --  34.8*     Recent Labs     12/12/19  0332 12/12/19  0332 12/13/19  0414 12/13/19  0613 12/14/19  0523   INR 2.03   < > 1.97 1.92 1.82   APTT 44.7*  --  49.0*  --  58.2*   FIBRINOGEN 163*  --  127*  --  104*    < > = values in this interval not displayed.       Imaging/Procedures:    US Liver doppler 12/11/19  IMPRESSION:  - Patent hepatic transplant vasculature.  - Interval increase in resistive indices noted throughout the hepatic arteries which are within upper limits of normal/minimally elevated, as above.  - Increased diameter common bile duct. No stone identified.  - Heterogeneous liver echotexture, which is somewhat nonspecific, however, can be seen in setting of underlying liver disease. Slight interval decrease in liver measurement with increased size spleen.  - Large-volume abdominopelvic ascites, similar to prior.  - Bilateral echogenic kidneys, nonspecific, however, can be seen with chronic medical renal disease.

## 2019-12-14 NOTE — Unmapped (Signed)
Pt stable in PICU.     No PRNs required for pain/nausea. AxOx4 until endoscopy around 1100, drowsy upon return and for rest of shift.   Clear lung sound, SORA, no desats.   Afebrile, VSS. LR @ 95 via PIV. Continued to trend H&H, see results.   NPO for procedure. NG tube placed by GI team, advanced per MD, verified by X-ray. No BM/UOP this shift, MD aware. Bladder scanned for >478, pt refusing void trials until mom is present. Emesis x1. Jaundiced, edematous, generalized weakness.      Grandma at bedside throughout shift.    Problem: Adult Inpatient Plan of Care  Goal: Plan of Care Review  Outcome: Ongoing - Unchanged  Goal: Patient-Specific Goal (Individualization)  Outcome: Ongoing - Unchanged  Goal: Absence of Hospital-Acquired Illness or Injury  Outcome: Ongoing - Unchanged  Goal: Optimal Comfort and Wellbeing  Outcome: Ongoing - Unchanged  Goal: Readiness for Transition of Care  Outcome: Ongoing - Unchanged  Goal: Rounds/Family Conference  Outcome: Ongoing - Unchanged

## 2019-12-14 NOTE — Unmapped (Signed)
PICU Progress Note  Interval events: Went for EGD yesterday, found to have candidal esophagitis without varices. Started on fluconazole. Creatinine was worsening, nephrology was consulted, vancomycin was discontinued and other meds were renally dosed. NG was placed and feeds were started. Overnight he had an adverse event after Pulmicort, so was switched to Flovent. This morning complaining of increased belly tightness and discomfort.     LOS: 3 days    Assessment: 19 y.o. 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 He is improved from admission and hemodynamically stable. Underlying etiology of his emesis likely due to significant esophagitis found on EGD. No varices or evidence of GI bleed. Will continue treatment with fluconazole. Adult hepatology following closely. AKI likely secondary to volume depletion from emesis, also worsened by tacrolimus and history of multiple previous episodes of kidney injury. Creatinine slightly worse today, per nephrology may be slow to improve. Will continue renally dosing medications and closely monitoring I/Os. He is stable for transfer to the floor.     Plan:     Resp: hx of asthma  - SORA  - Home Flovent 1 puff BID (adverse reaction to Atrovent)   - Albuterol PRN  - VBG PRN  ??  CV: initially hypotensive, continuing albumin and volume resuciations, improved MAPs  - CRM  - s/p albumin 25 % 25 g x 5, will start albumin 25 g 25% BID per adult GI recs   - MAP goals of >60   ??  Heme: Repeat PVL on 11/13 demonstrate stable DVTs in RUE axial and brachial veins, not on therapeutic anticoagulation  - CBCd and DIC daily   - SCDs for ppx  ??  ID:  - Discontinued Vanc 2/2 given Blood Cx 1/30 at [facility] no growth to date x3 days and improved hemodynamics, will discuss discontinuing cefepime and flagyl with adult GI   - Peritoneal cultures pending 1/31  - holding home Bactrim ppx (usually M-W-F) due to AKI  - home valganciclovir daily, re-dosed renally  - Continue Fluconazole daily per Adult Hepatology for candidal esophagitis, renal-dose  ??  Neuro:  - neuro checks q4h  ??  FEN/GI:   - Strict I/O's  - Resume low-salt diet or clears  - NG feeds: Vital AF @ 20 ml/hr increasing by 10 ml/hr q4hr until reaching goal of 80 ml/hr. Flush min of 30 ml q6hr to maintain patency of tube    - will hold feeds at 50 ml/hr today due to abdominal discomfort   - mIVFs via LR for total fluids @ 71ml/hr  - IV Zofran 4mg  q8h PRN  - CMP daily   - IV Protonix 40mg  BID  - Home aquadeks, mag-ox, vit D3  - adult hepatology, peds GI consulted    Renal: AKI   - Peds Nephrology consulted   - d/c'd vanc 2/2   - Dose-adjust meds renally   - Renal US  - Strict I/Os - goal net even  ??  Hepatology:   - adult hepatology, peds GI consulted  - home mycophenolate   - holding tacrolimus given nephrotoxicity  - home spironolactone 50mg  daily  - home rifaximin 550mg  BID  - restarting prednisone 15mg  daily  - HFP daily  - Continue Fluconazole per Adult Hepatology for candidal esophagitis  ??  ENDO: hx of steroid-induced  hyperglycemia, previously on insulin but not currently. Euglycemic on admission.   - Discontinue stress dose-steroids   - Restart prednisone 15mg  daily  ??  DERM: steroid-induced acne and atypical dermatitis  - Resume home topical clindamycin and benzoyl peroxide  ??  Psych: Seen previously by Dr. Jessee Avers, set up with [facility]  - consulted children's supportive care  - consulted adult pscyh    RASS goal: not sedated  DVT ppx: None     Changes to Lines/Tubes: remove a-line today    Family communication: Mom updated at bedside   Dispo: PICU    PICU Resident Pager: [phone]  PICU Resident Phone: [phone]    Vitals:   Dosing weight: 52.9 kg (116 lb 10 oz) (12/11/19 0456)  Most recent weight: 52.9 kg (116 lb 10 oz) (12/11/19 0200)    Temp:  [36 ??C-36.6 ??C] 36.4 ??C  Heart Rate:  [68-96] 85  SpO2 Pulse:  [68-171] 87  Resp:  [16-29] 25  BP: (84-107)/(37-70) 99/64  MAP (mmHg):  [53-81] 75  A BP-2: (90-124)/(44-77) 124/69  MAP:  [58 mmHg-90 mmHg] 82 mmHg  SpO2:  [99 %-100 %] 100 %     I/O       02/01 0701 - 02/02 0700 02/02 0701 - 02/03 0700 02/03 0701 - 02/04 0700    P.O.  240     I.V. (mL/kg) 2412 (45.6) 1983.7 (37.5) 216.8 (4.1)    Blood       NG/GT  380 180    IV Piggyback 700 300     Total Intake 3112 2903.7 396.8    Urine (mL/kg/hr) 400 (0.3) 475 (0.4) 0 (0)    Stool 550 0     Total Output(mL/kg) 950 (18) 475 (9) 0 (0)    Net +2162 +2428.7 +396.8           Urine Occurrence 1 x      Stool Occurrence 4 x 2 x            Physical Exam:   General: cachetic, sleeping but arousable, able to converse well   Head: [place]/AT  Lungs:  Clear to ascultation bilaterally, no wheezes or crackles, no tachypnea, normal WOB on room air  Heart:   Normal S1 and S2, no murmurs, radial pulses palpable  Abdomen: surgical scars on abdomen present. Abdomen is round and full, mildly distended.   Neuro:  Answers questions, cooperates with exam. No focal deficits.  Extremities:  Thin, muscle wasting, edema in b/l  LE  Skin: jaundice, skin warm and dry    Labs and studies reviewed. Pertinent results include :  Hb  7.5 -> 7.7 ->7.9 ->9.0 -> 8.6  PT: 22.7 -> 22.2 -> 21.1  PTT: 44-> 49 -> 58.2  Na 137  K: 3.3 -> 2.7  Cl: 112  Cr: 1.9-> 2.23 -> 2.4  Alb 2.4  Tbili 30.8->34.8  AST 141->130   ALT specimen icteric   Alk phos 804->819     Lab Results   Component Value Date    WBC 11.2 (H) 12/14/2019    HGB 8.1 (L) 12/14/2019    HCT 25.3 (L) 12/14/2019    PLT 131 (L) 12/14/2019       Lab Results   Component Value Date    NA 137 12/14/2019    K 2.7 (L) 12/14/2019    CL 112 (H) 12/14/2019    CO2  12/14/2019      Comment:      Specimen icteric.  BUN 23 (H) 12/14/2019    CREATININE 2.40 (H) 12/14/2019    GLU 219 (H) 12/14/2019    CALCIUM 7.9 (L) 12/14/2019 MG 2.0 12/14/2019    PHOS 4.6 12/14/2019       Lab Results   Component Value Date    BILITOT 34.8 (H) 12/14/2019    BILIDIR 28.30 (H) 12/12/2019    PROT  12/14/2019      Comment:      Specimen icteric.    ALBUMIN 2.4 (L) 12/14/2019    ALT  12/14/2019      Comment:      Specimen icteric.      AST 130 (H) 12/14/2019    ALKPHOS 819 (H) 12/14/2019    GGT 256 (H) 12/11/2019       Lab Results   Component Value Date    PT 21.1 (H) 12/14/2019    INR 1.82 12/14/2019    APTT 58.2 (H) 12/14/2019       Lines/Tubes:   Patient Lines/Drains/Airways Status    Active Active Lines, Drains, & Airways     Name:   Placement date:   Placement time:   Site:   Days:    NG/OG Tube Feedings Right nostril   12/13/19    1800    Right nostril   less than 1    Peripheral IV 09/30/19 Left;Posterior Forearm   09/30/19    0958    Forearm   75    Arterial Infusion Catheter 12/11/19 2.5 2.5 cm Non-tunneled   12/11/19    1745     2                Leroy Kennedy, MD  [facility] Pediatric Residency, PGY2

## 2019-12-15 LAB — CULTURE, BLOOD (ROUTINE X 2): Culture: NO GROWTH

## 2019-12-15 LAB — RED CELL DISTRIBUTION WIDTH: Lab: 24 — ABNORMAL HIGH

## 2019-12-15 LAB — SLIDE REVIEW

## 2019-12-15 LAB — COMPREHENSIVE METABOLIC PANEL
ALBUMIN: 2.8 g/dL — ABNORMAL LOW (ref 3.5–5.0)
ALKALINE PHOSPHATASE: 695 U/L — ABNORMAL HIGH (ref 65–260)
AST (SGOT): 125 U/L — ABNORMAL HIGH (ref 19–55)
BILIRUBIN TOTAL: 34.8 mg/dL — ABNORMAL HIGH (ref 0.0–1.2)
BLOOD UREA NITROGEN: 28 mg/dL — ABNORMAL HIGH (ref 7–21)
BUN / CREAT RATIO: 12
CALCIUM: 8.3 mg/dL — ABNORMAL LOW (ref 8.5–10.2)
CHLORIDE: 111 mmol/L — ABNORMAL HIGH (ref 98–107)
CREATININE: 2.38 mg/dL — ABNORMAL HIGH (ref 0.70–1.30)
EGFR CKD-EPI NON-AA MALE: 38 mL/min/{1.73_m2} — ABNORMAL LOW (ref >=60)
GLUCOSE RANDOM: 220 mg/dL — ABNORMAL HIGH (ref 70–179)
POTASSIUM: 3.1 mmol/L — ABNORMAL LOW (ref 3.5–5.0)
SODIUM: 134 mmol/L — ABNORMAL LOW (ref 135–145)

## 2019-12-15 LAB — INR: Coagulation tissue factor induced.INR:RelTime:Pt:PPP:Qn:Coag: 1.74

## 2019-12-15 LAB — CBC W/ AUTO DIFF
BASOPHILS ABSOLUTE COUNT: 0.1 10*9/L (ref 0.0–0.1)
BASOPHILS RELATIVE PERCENT: 0.6 %
EOSINOPHILS ABSOLUTE COUNT: 0 10*9/L (ref 0.0–0.4)
EOSINOPHILS RELATIVE PERCENT: 0.1 %
HEMATOCRIT: 25.8 % — ABNORMAL LOW (ref 41.0–53.0)
HEMOGLOBIN: 8.6 g/dL — ABNORMAL LOW (ref 13.5–17.5)
LYMPHOCYTES ABSOLUTE COUNT: 0.4 10*9/L — ABNORMAL LOW (ref 1.5–5.0)
LYMPHOCYTES RELATIVE PERCENT: 3.6 %
MEAN CORPUSCULAR HEMOGLOBIN CONC: 33.3 g/dL (ref 31.0–37.0)
MEAN CORPUSCULAR HEMOGLOBIN: 30.4 pg (ref 26.0–34.0)
MEAN CORPUSCULAR VOLUME: 91.2 fL (ref 80.0–100.0)
MEAN PLATELET VOLUME: 7.6 fL (ref 7.0–10.0)
MONOCYTES ABSOLUTE COUNT: 0.5 10*9/L (ref 0.2–0.8)
MONOCYTES RELATIVE PERCENT: 3.7 %
NEUTROPHILS ABSOLUTE COUNT: 11 10*9/L — ABNORMAL HIGH (ref 2.0–7.5)
NEUTROPHILS RELATIVE PERCENT: 90.9 %
PLATELET COUNT: 132 10*9/L — ABNORMAL LOW (ref 150–440)
RED BLOOD CELL COUNT: 2.83 10*12/L — ABNORMAL LOW (ref 4.50–5.90)
RED CELL DISTRIBUTION WIDTH: 24 % — ABNORMAL HIGH (ref 12.0–15.0)
WBC ADJUSTED: 12.1 10*9/L — ABNORMAL HIGH (ref 4.5–11.0)

## 2019-12-15 LAB — SMEAR REVIEW

## 2019-12-15 LAB — APTT: APTT: 37.8 s — ABNORMAL HIGH (ref 25.3–37.1)

## 2019-12-15 LAB — PROTIME-INR: PROTIME: 20.2 s — ABNORMAL HIGH (ref 10.5–13.5)

## 2019-12-15 LAB — D-DIMER QUANTITATIVE (CH,ML,PD,ET): Fibrin D-dimer DDU:MCnc:Pt:PPP:Qn:: 660 — ABNORMAL HIGH

## 2019-12-15 LAB — MAGNESIUM: Magnesium:MCnc:Pt:Ser/Plas:Qn:: 2.1

## 2019-12-15 LAB — PHOSPHORUS: Phosphate:MCnc:Pt:Ser/Plas:Qn:: 3.6

## 2019-12-15 LAB — HEPARIN CORRELATION: Lab: 0.2

## 2019-12-15 LAB — ALKALINE PHOSPHATASE: Alkaline phosphatase:CCnc:Pt:Ser/Plas:Qn:: 695 — ABNORMAL HIGH

## 2019-12-15 LAB — FIBRINOGEN LEVEL: Fibrinogen:MCnc:Pt:PPP:Qn:Coag: 91 — CL

## 2019-12-15 NOTE — Unmapped (Signed)
Pediatric Nephrology   Consult Note     Requesting Attending Physician :  Morrison Old, MD  Service Requesting Consult : Ped Gastroenterology [facility])    Reason for Consult: AKI    Assessment and Plan:     [name] Gross is a 19 y.o. 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 His AKI was likely worsened by tacrolimus (vasoconstrictive) and his history of multiple previous episodes of kidney injury. Urine microscopy with bile stained muddy brown casts, consistent with ATN (pictured below). His renal ultrasound was unremarkable.    Pediatric nephrology has consulted on [name] Gross in the past for AKI and HTN. He has a history of multiple episodes of AKI which may make his recovery from this episode of ATN slower than one would expect with healthy underlying kidneys. His creatinine has improved slightly and will likely continue to improve and downtrend. His urine output has also improved over the last 24 hours. In the meantime, please follow his urine output closely. He is at risk for post AKI diuresis and may need IVF to be restarted. Overall, expect graduate improvement of his AKI in the coming days.     Recommendations:  -- Agree with continuing NG feeds and discontinuing fluids for now  - monitor I/Os very closely, he is at risk for post AKI diuresis  -- if net negative > 500-756ml, then recommend restarting fluids to allow him to be only slightly negative   -- OK to give albumin from a renal standpoint  -- Need to carefully consider timing of re-initiation of tac given nephrotoxicity and risk of rejection.   -- Avoid additional nephrotoxicic agents as able  -- We will continue to follow along with you     I saw and evaluated the patient, participating in the key portions of the service.?? I reviewed the resident???s note.?? I agree with the resident???s findings and plan.   Corrinne Eagle MD MPH  Pediatric Nephrology                Subjective:     19 year old male with history of cryptogenic cirrhosis??s/p liver transplant in 2017 with non -adherence and history of multiple rejection episodes leading to liver failure. Transferred from OSH due to decreased PO intake and vomiting x 2 weeks. Emesis recently turned darker brown in appearance. No urine output x 24 hours prior to admission. At OSH, initial blood pressures of 60s/40s, HR 90s.  Na 132, K 2.9, Cl 102, BUN 26, Cr 2.7 Received 60 ml/kg of volume resuscitation prior to arrival at [facility]. Home meds include tacrolimus, valcyte,  MMF.     Over the last 24 hours, had 475 ml of UOP  FENa 1.8% and FEurea 61% on day of admission.     Creatinine       1.8   on 11/5    1.07 on 12/10    1.78 on 12/29      2.2 on 2/2     2.4 on 2/3    2.38 on 2.4             has a past medical history of Acute rejection of liver transplant (CMS-HCC) (08/20/2017); Depression; Seasonal allergies; and Sickle cell trait (CMS-HCC).    Review of Systems: ten systems reviewed and negative but for that noted in HPI    Medications:  Current Facility-Administered Medications   Medication Dose Route Frequency Provider Last Rate Last Admin   ??? albumin human 25 % bottle 25 g  25 g Intravenous BID David Stall, MD       ??? benzoyl peroxide 5 % gel   Topical Nightly David Stall, MD   1 application at 12/14/19 2023   ??? cholecalciferol (vitamin D3) tablet 5,000 Units  5,000 Units Oral Daily David Stall, MD   5,000 Units at 12/15/19 0804   ??? clindamycin (CLEOCIN T) 1 % external solution   Topical Nightly David Stall, MD   1 application at 12/14/19 2023   ??? fluconazole (DIFLUCAN) tablet 100 mg  100 mg Oral Daily David Stall, MD   100 mg at 12/15/19 1610   ??? magnesium oxide (MAG-OX) tablet 400 mg  400 mg Oral BID David Stall, MD   400 mg at 12/15/19 0804   ??? multivitamin, with zinc (AQUADEKS) chewable tablet  2 tablet Oral Daily David Stall, MD   2 tablet at 12/15/19 0804   ??? mycophenolate (MYFORTIC) EC tablet 540 mg  540 mg Oral BID David Stall, MD   540 mg at 12/15/19 0804   ??? ondansetron (ZOFRAN) injection 4 mg  4 mg Intravenous Q8H PRN David Stall, MD   4 mg at 12/12/19 0128   ??? pantoprazole (PROTONIX) EC tablet 40 mg  40 mg Oral BID Morrison Old, MD   40 mg at 12/15/19 0805   ??? potassium chloride (KLOR-CON) CR tablet 20 mEq  20 mEq Oral BID David Stall, MD   20 mEq at 12/15/19 0804   ??? predniSONE (DELTASONE) tablet 20 mg  20 mg Oral Daily David Stall, MD   20 mg at 12/15/19 0804   ??? rifAXIMin (XIFAXAN) tablet 550 mg  550 mg Oral BID David Stall, MD   550 mg at 12/15/19 0805   ??? spironolactone (ALDACTONE) tablet 50 mg  50 mg Oral Daily David Stall, MD   50 mg at 12/15/19 0804   ??? valGANciclovir (VALCYTE) tablet 450 mg  450 mg Oral Q48H David Stall, MD   450 mg at 12/15/19 9604       Allergies:     Allergies   Allergen Reactions   ??? Bee Pollen Rash     Pt has seasonal allergies that cause excessive sneezing and running nose- [name] Gross, NAII   ??? Pollen Extracts Rash     Pt has seasonal allergies that cause excessive sneezing and running nose- [name] Gross       Past Medical History:     Past Medical History:   Diagnosis Date   ??? 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 Depression    ??? MSSA bacteremia 08/27/2019   ??? Seasonal allergies    ??? Sickle cell trait (CMS-HCC)    ??? Strep Mitis Central line-associated bloodstream infection 08/27/2019       Family History:     Family History   Problem Relation Age of Onset   ??? Diabetes Maternal Grandmother    ??? Diabetes Paternal Grandmother    ??? Hypertension Maternal Grandfather    ??? Hypertension Paternal Grandfather    ??? Asthma Brother    ??? Anesthesia problems Neg Hx    ??? Melanoma  Neg Hx    ??? Basal cell carcinoma Neg Hx    ??? Squamous cell carcinoma Neg Hx        Social History:     Social History     Social History Narrative    Lives part time with mother and part time with [employer]GM.        Objective:     BP 107/59  - Pulse 94  - Temp 36.8 ??C (Oral)  - Resp 26  - Ht 170.2 cm (5' 7.01)  - Wt 52.9 kg (116 lb 10 oz)  - SpO2 100%  - BMI 18.26 kg/m??   2 %ile (Z= -2.03) based on CDC (Boys, 2-20 Years) weight-for-age data using vitals from 12/11/2019.  18 %ile (Z= -0.92) based on CDC (Boys, 2-20 Years) Stature-for-age data based on Stature recorded on 12/11/2019.    General Appearance:  Awake, appears uncomfortable and tired. Answers questions with nods   HEENT: MMM, NGT in place  Chest:  CTAB, No increased work of breathing  Heart:  RRR, no murmurs  Abd: soft, has ascites, tenderness throughout  Extremities: swelling in bilateral feet, +2 pitting edema in lower extremities bilaterally  Neuro: awake, answers questions by nodding, appears tremulous with movement    Labs:   No results displayed because visit has over 200 results.          Jodelle Gross. Pritt, MD  [facility], PGY3  Pager: [phone]

## 2019-12-15 NOTE — Unmapped (Signed)
Pediatric Daily Progress Note     Assessment/Plan:     Principal Problem:    AKI (acute kidney injury) (CMS-HCC)  Active Problems:    History of liver transplant (CMS-HCC)    Major depressive disorder    Liver transplant rejection (CMS-HCC)  Resolved Problems:    * No resolved hospital problems. *    [name] Gross is a 19 y.o. 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 He is currently in stable condition. He requires continued care in the hospital for assessment and management of his AKI, acute on chronic liver rejection, and overall nutritional status. Given the patient's marked weight loss on presentation and poor PO intake, he requires continued NG tube feeds with an adjusted goal of 60mL. He was previously receiving mIVF for management of ATN, but will discontinue this to assist in giving patient space to increase PO intake in the setting of continuous enteral feeds. Will continue to monitor the patient's overall fluid balance, and work with patient and family to clarify his goals for care and manage co-existing psychiatric issues.     Acute Kidney Injury: Likely due to volume depletion in the setting of coffee ground emesis and tacrolimus use. Urine microscopy showing muddy brown casts c/w ATN.  - Peds nephrology following, appreciate recs.    - Will discontinue IVF given c/f fluid overload and adequate PO fluid intake  - Renal U/S completed 2/3 - Final read pending  - Creatinine trend: 2/2 - 2.23, 2/3 - 2.4, 2/4 - 2.38 (prior to admission, creatinine 1.07-1.78)  - Medications are renally-dosed   - History of decreased SBP to 80's likely due to fluid depletion; stable since transfer     FEN/GI  - Strict I/O  - Calorie count  - Discontinue mIVF, per peds nephrology recs  - Initiate Albumin 25mg  BID. Will consider PRN paracentesis if ascites worsens.   - Albumin goal >3  - Switch to K Phos PO 500mg  BID    - K at 3.1 on 2/4 from 2.7 on 2/3  - Continuous feeds at 15ml/hr with updated TFG of 110ml/hr, given pt's inability to tolerate increase in feed goal              - Vital AF increasing by 10 ml/hr q4hr as tolerated              - Flush min of 30 ml q6hr to maintain patency of tube   - Regular diet with calorie count  - Vitamin D3 supplementation qd   - IV Zofran 4mg  q8h PRN  - IV Protonix 40mg  BID  - Continue home aquadeks, mag-ox, vit D3    Candidal Esophagitis: Confirmed on EGD 2/2, with no varices identified in the setting of coffee-ground emesis.  - Fluconazole 100mg  every day (loading dose 400mg  given 2/2) for 13 days   - S/p vancomycin, cefepime and flagyl due to initial concern for SBP which is now unlikely               - NGTD on peritoneal cultures 1/31    ??  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 Currently being considered for repeat  transplant.  - Peds GI and adult hepatology following  - Continue to hold tacrolimus and bactrim prophylaxis in setting of AKI  - Spironolactone 50mg  daily  - Mycophenolate 540mg  BID  - Rifaximin 550mg  BID  - Valganciclovir daily   - Prednisone 20mg  daily;               - s/p stress dosing in PICU     Heme: History of DVT in RUE, no signs of DVT during current admission.   - Fibrinogen low at 91. No interventions required at this time per adult hepatology. Will continue to defer to adult hepatology recs.   - CBC daily    - Leukocytosis (12.1 on 2/4) likely 2/2 prednisone  - SCD's in place     Psych:  - Psychiatry following, appreciate recs. Per Psych, patient's presentation of mood symptoms is consistent with MDD. Pt interested in starting Lexapro, but will hold until more medically stable.   - Start hydroxyzine 25mg  PO q6h PRN for anxiety and sleep  - Obtain baseline EKG to determine baseline QTc in setting of medication use (fluconazole + hydroxyzine)  - Thiamine PO 200 mg TID x5 days, then transition to 100 mg daily   - Folate supplementation and multivitamin daily.  - Continue delirium protocol    Endocrine: Patient with hyperglycemia (220 on 2/4) in setting of prednisone.  - Adult Endocrinology consulted, pending recs    Social:  - Consult Palliative Care to clarify patient's goals for admission  ??  Lines: PIV, NGT  ??  Plan of care discussed with caregiver(s) at bedside.    Subjective:     Interval History: [name] Gross states that he is doing well but have been better. He reports that what is bothering him most has been the NG tube. States that he had a hard time breathing when feeds were increased from 60 to 70mL and prefers to stay on 60. He endorses pain in the RU/LQ of his abdomen, but says it is not worse than before. Denies vomiting, nausea, diarrhea. Pt states that he thinks that he can take in food PO and that he has had no problem with eating and drinking following admission. Pt also reports edema in his feet, which he describes as worse than when he was first admitted. He denies dyspnea, chest pain, lightheadedness/dizziness, constipation.     Objective:     Vital signs in last 24 hours:  Temp:  [36.7 ??C (98.1 ??F)-36.8 ??C (98.2 ??F)] 36.8 ??C (98.2 ??F)  Heart Rate:  [87-94] 94  SpO2 Pulse:  [93] 93  Resp:  [16-26] 26  BP: (107-114)/(59-61) 107/59  MAP (mmHg):  [73] 73  SpO2:  [100 %] 100 %  Intake/Output last 3 shifts:  I/O last 3 completed shifts:  In: 3592.6 [P.O.:240; I.V.:1412.6; NG/GT:1740; IV Piggyback:200]  Out: 1275 [Urine:1275]    Physical Exam:  General: Alert, sitting up in bed, in no acute distress, cooperative with exam  Head: atraumatic, normocephalic, no masses, lesions, tenderness or abnormalities  Eyes: Scleral icterus present; pupils equal, round, reactive to light, conjunctiva clear, extraocular movements intact  Nose: Clear, no discharge, NG tube in place  Neck:  Full range of motion, supple, mild, non-tender L submandibular swelling present  Lungs: Clear to auscultation, no wheezing, crackles or rhonchi, breathing unlabored  Heart: Normal PMI. regular rate and rhythm, normal S1, S2, no murmurs or gallops.  Abdomen: Prior surgical scars present, abdomen soft, non-tender. Ascitic abdomen with fluid wave present. Active bowel sounds.  TTP in RLQ and central lower abdomen.  Neuro: Tremor at rest and with movement on UE bilaterally.   Extremities:  2+ pedal edema present bilaterally; moves all extremities equally and sensation intact. 1+ muscle strength in upper extremities. No erythema, edema of UE.  Skin: Post-inflammatory hyperpigmentation present on chest and back due to acne; Otherwise skin color, texture and turgor are normal; no bruising, rashes or lesions noted    Studies: Personally reviewed and interpreted.    Labs/Studies:  Labs and Studies from the last 24hrs per EMR and Reviewed and   All lab results last 24 hours:    Recent Results (from the past 24 hour(s))   Comprehensive Metabolic Panel    Collection Time: 12/15/19  4:40 AM   Result Value Ref Range    Sodium 134 (L) 135 - 145 mmol/L    Potassium 3.1 (L) 3.5 - 5.0 mmol/L    Chloride 111 (H) 98 - 107 mmol/L    Anion Gap      CO2      BUN 28 (H) 7 - 21 mg/dL    Creatinine 1.61 (H) 0.70 - 1.30 mg/dL    BUN/Creatinine Ratio 12     EGFR CKD-EPI Non-African American, Male 38 (L) >=60 mL/min/1.44m2    EGFR CKD-EPI African American, Male 44 (L) >=60 mL/min/1.79m2    Glucose 220 (H) 70 - 179 mg/dL    Calcium 8.3 (L) 8.5 - 10.2 mg/dL    Albumin 2.8 (L) 3.5 - 5.0 g/dL    Total Protein      Total Bilirubin 34.8 (H) 0.0 - 1.2 mg/dL    AST 096 (H) 19 - 55 U/L    ALT      Alkaline Phosphatase 695 (H) 65 - 260 U/L   Magnesium Level    Collection Time: 12/15/19  4:40 AM   Result Value Ref Range    Magnesium 2.1 1.6 - 2.2 mg/dL   Phosphorus Level Collection Time: 12/15/19  4:40 AM   Result Value Ref Range    Phosphorus 3.6 2.9 - 4.7 mg/dL   aPTT    Collection Time: 12/15/19  4:40 AM   Result Value Ref Range    APTT 37.8 (H) 25.3 - 37.1 sec    Heparin Correlation 0.2    PT-INR    Collection Time: 12/15/19  4:40 AM   Result Value Ref Range    PT 20.2 (H) 10.5 - 13.5 sec    INR 1.74    Fibrinogen    Collection Time: 12/15/19  4:40 AM   Result Value Ref Range    Fibrinogen 91 (LL) 177 - 386 mg/dL   D-Dimer, Quantitative    Collection Time: 12/15/19  4:40 AM   Result Value Ref Range    D-Dimer 660 (H) <230 ng/mL DDU   CBC w/ Differential    Collection Time: 12/15/19  4:40 AM   Result Value Ref Range    WBC 12.1 (H) 4.5 - 11.0 10*9/L    RBC 2.83 (L) 4.50 - 5.90 10*12/L    HGB 8.6 (L) 13.5 - 17.5 g/dL    HCT 04.5 (L) 40.9 - 53.0 %    MCV 91.2 80.0 - 100.0 fL    MCH 30.4 26.0 - 34.0 pg    MCHC 33.3 31.0 - 37.0 g/dL    RDW 81.1 (H) 91.4 - 15.0 %    MPV 7.6 7.0 - 10.0 fL    Platelet 132 (L) 150 - 440 10*9/L  Variable HGB Concentration Marked (A) Not Present    Neutrophils % 90.9 %    Lymphocytes % 3.6 %    Monocytes % 3.7 %    Eosinophils % 0.1 %    Basophils % 0.6 %    Neutrophil Left Shift 3+ (A) Not Present    Absolute Neutrophils 11.0 (H) 2.0 - 7.5 10*9/L    Absolute Lymphocytes 0.4 (L) 1.5 - 5.0 10*9/L    Absolute Monocytes 0.5 0.2 - 0.8 10*9/L    Absolute Eosinophils 0.0 0.0 - 0.4 10*9/L    Absolute Basophils 0.1 0.0 - 0.1 10*9/L    Large Unstained Cells 1 0 - 4 %    Microcytosis Moderate (A) Not Present    Macrocytosis Moderate (A) Not Present    Anisocytosis Marked (A) Not Present    Hyperchromasia Slight (A) Not Present    Hypochromasia Marked (A) Not Present   Morphology Review    Collection Time: 12/15/19  4:40 AM   Result Value Ref Range    Smear Review Comments See Comment (A) Undefined    Target Cells Moderate (A) Not Present    Schistocytes Moderate (AA) Not Present    Spherocytes Present (A) Not Present    Acanthocytes Present (A) Not Present Pappenheimer Bodies Present (A) Not Present    Poikilocytosis Marked (A) Not Present     ========================================  Markus Jarvis, MS3    I attest that I have reviewed the student note and that the components of the history of the present illness, the physical exam, and the assessment and plan documented were performed by me or were performed in my presence by the student where I verified the documentation and performed (or re-performed) the exam and medical decision making.       Tora Duck, MD, MPH  Peds Intern, PGY-1  Pager: [phone]  December 15, 2019   5:13 PM        I performed a history and physical examination of the patient and discussed the patient's management with the family and the resident. I personally reviewed the results of all tests, including blood work and images. I reviewed and edited the resident's note and agree with the documented findings and plan of care. I provided direct supervision to the care of the patient. I spent over 50% of a total 55 min counseling and/or coordinating the care of this patient.     Rocco Pauls, MD  Pediatric Gastroenterology

## 2019-12-15 NOTE — Unmapped (Signed)
[name]  Follow-Up Psychiatry Consult Note     Service Date: December 15, 2019  LOS:  LOS: 4 days      Assessment:   [name] Gross is a 19 y.o. male with pertinent past medical and psychiatric diagnoses of cryptogenic cirrhosis s/p liver transplant in 2017 with non-compliance to anti-rejection medication with multiple episodes of acute on chronic rejection leading to graft loss, with course complicated by MSSA and strep mitis bacteremia, right upper extremity DVT, G1 esophageal varices, ascites and limb swelling, and diarrhea, admitted 12/11/2019  1:30 AM as a transfer from [facility] where he initially presented with coffee ground emesis and decreased PO intake x2 weeks, found to have oliguria with an AKI (Cr 2.7), concerning for pre-renal AKI due to dehydration vs. hepatorenal syndrome (HRS) with possible GI bleed from known esophageal varices..  Patient was seen in consultation by Psychiatry at the request of [name], MD with [facility]) for evaluation of Depression and pt was diagnosed with historical diagnosis of MDD. Today, we are seeing pt to follow mood, sleep and mental status.     The patient's current presentation of persistent depressed mood, poor sleep, and poor appetite, is most consistent with known diagnosis of Major Depressive Disorder.  Patient and mom would like to start standing antidepressant again, and are agreeable to retrying lexapro. Given potential for hyponatremia, increased risk for bleeds, and drug-drug interactions with lexapro -- will wait until patient is more medically stable. Agree with psychology teams involvement with patient.    Please see below for detailed recommendations.    Diagnoses:   Active Gross problems:  Principal Problem:    AKI (acute kidney injury) (CMS-HCC)  Active Problems:    History of liver transplant (CMS-HCC)    Major depressive disorder    Liver transplant rejection (CMS-HCC)       Problems edited/added by me:  No problems updated.    Safety Risk Assessment:  A full risk assessment was previously performed on 2/1.  Risk assessment remains essentially unchanged. Patient remains not at acutely elevated risk.       Recommendations:   ## Safety:   -- No acute safety concerns.  Please see safety assessment for further discussion.    ## Medications:   -- START hydroxyzine 25 mg PO q6h PRN anxiety, sleep  -- START Thiamine IV 200 mg tid x5 days, then transition to PO/NGT 100 mg daily.  IV formulation chosen given poor nutritional status.  -- START Folate supplementation and multivitamin daily.  -- START Melatonin 3 mg q1800 for sleep and circadian rhythm regulation.   -- Agree with vitamin D supplementation    ## Medical Decision Making Capacity:   -- A formal capacity assessement was not performed as a part of this evaluation.  If specific capacity questions arise, please contact our team as below.     ## Further Work-up:   -- Please obtain EKG  -- While the patient is receiving medications (such as hydroxyzine) that may prolong QTc and increase risk for torsades:     - MONITOR and KEEP Mg>2 and K>4      - MONITOR QTc regularly.  If QTc on tele strip >436ms, obtain 12-lead EKG.    ## Disposition:   -- There are no psychiatric contraindications to discharging this patient when medically appropriate.   -- Agree with psychology speaking with patient     ## Behavioral / Environmental:   -- Please continue Delirium (prevention) protocol  detailed in initial consult note.    Thank you for this consult request. Recommendations have been communicated to the primary team.  We will follow as needed at this time. Please page [phone] or [phone] (after hours)  for any questions or concerns.     This patient was evaluated in person.    Discussed with and seen by Fellow, Imagene Sheller, MD.  Discussed with and seen by Attending, Torrie Mayers, MD, who agrees with the assessment and plan.    Sloan Leiter, MD  I saw and evaluated the patient, participating in the key portions of the service.  I reviewed and edited the resident???s note.  I agree with the resident???s findings and plan.     Elray Buba, MD          Interval History:     Updates to Gross course since last seen by psychiatry: Micah Flesher for EGD, found to have candidal esophagitis without varices. Started on fluconazole. Creatinine was worsening, nephrology was consulted, vancomycin was discontinued and other meds were renally dosed. NG was placed and feeds were started.    Patient Interview:  Patient states his mood has been up and down, but overall he feels better. Mom at bedside agrees that he seems better. Has been sleeping ok here. Denies pain. Endorses frustration with being here in the Gross. Feels safe, denies SI/HI/AVH. Oriented x 4. Starts MOYB but then states he needs to go to the bathroom urgently, and interview is concluded.    ROS:   All systems reviewed as negative/unremarkable aside from the following pertinent positives and negatives: negative for difficulties with sleep    Collateral:   - Reviewed medical records in Epic    Relevant Updates to past psychiatric, medical/surgical, family, or social history: reviewed and no new updates.    Current Medications:  Scheduled Meds:  ??? benzoyl peroxide   Topical Nightly   ??? cholecalciferol (vitamin D3)  5,000 Units Oral Daily   ??? clindamycin   Topical Nightly   ??? fluconazole  100 mg Oral Daily   ??? magnesium oxide  400 mg Oral BID   ??? multivitamin, with zinc  2 tablet Oral Daily   ??? mycophenolate  540 mg Oral BID   ??? pantoprazole  40 mg Oral BID   ??? potassium chloride  20 mEq Oral BID   ??? predniSONE  20 mg Oral Daily   ??? rifAXIMin  550 mg Oral BID   ??? spironolactone  50 mg Oral Daily   ??? valGANciclovir  450 mg Oral Q48H     Continuous Infusions:  ??? PEDIATRIC Custom IV infusion builder 10 mL/hr (12/15/19 0422)     PRN Meds:.ondansetron      Objective:   Vital signs:   Temp:  [36.4 ??C-36.8 ??C] 36.8 ??C  Heart Rate:  [80-94] 94  SpO2 Pulse:  [80-93] 93  Resp:  [16-29] 26  BP: (99-114)/(59-64) 107/59  MAP (mmHg):  [73-75] 73  A BP-2: (115-124)/(66-69) 124/69  MAP:  [78 mmHg-82 mmHg] 82 mmHg  SpO2:  [100 %] 100 %    Physical Exam:  Gen: No acute distress.  Pulm: Normal work of breathing.  Neuro/MSK: Normal bulk/tone. Gait/station deferred given acutely ill.  Skin: jaundiced skin and eyes     Mental Status Exam:  Appearance:  appears stated age and ill-appearing   Attitude:   calm and cooperative   Behavior/Psychomotor:  appropriate eye contact and tremors of the upper extremities   Speech/Language:  normal rate, volume, tone, fluency   Mood:  a little better   Affect:  flat, guarded and mood congruent   Thought process:  logical, linear, clear, coherent, goal directed   Thought content:    denies thoughts of self-harm. Denies SI, plans, or intent. Denies HI.  No grandiose, self-referential, persecutory, or paranoid delusions noted.   Perceptual disturbances:   denies auditory and visual hallucinations   Attention:  able to fully attend without fluctuations in consciousness, starts MOYB but has to end due to having to go to the bathroom   Concentration:  Able to fully concentrate and attend   Orientation:  Oriented to person, place, city, date and situation.   Memory:  not formally tested, but grossly intact   Fund of knowledge:   not formally assessed   Insight:    Impaired   Judgment:   Limited   Impulse Control:  Limited       Data Reviewed:  I reviewed labs from the last 24 hours.  I reviewed imaging reports from the last 24 hours.     Additional Psychometric Testing:  Not applicable.

## 2019-12-15 NOTE — Unmapped (Signed)
Patient afebrile, VSS, on room air. No complaints of pain, however patient complained of fullness in abdomen related to ascites and feeds. Tolerating feeds at 103ml/hr via NGT, q6 FWF. Voiding ~q6, remains amber in color. BM loose. PIV c.d.I, infusing SF. RUE remains swollen, MD notified and came to assess at bedside. Patient eating 25-50% of meals. Mom at beside and active in care. Will CTM and follow POC.      Problem: Adult Inpatient Plan of Care  Goal: Plan of Care Review  Outcome: Progressing  Goal: Patient-Specific Goal (Individualization)  Outcome: Progressing  Goal: Absence of Hospital-Acquired Illness or Injury  Outcome: Progressing  Goal: Optimal Comfort and Wellbeing  Outcome: Progressing  Goal: Readiness for Transition of Care  Outcome: Progressing  Goal: Rounds/Family Conference  Outcome: Progressing     Problem: Skin Injury Risk Increased  Goal: Skin Health and Integrity  Outcome: Progressing     Problem: Self-Care Deficit  Goal: Improved Ability to Complete Activities of Daily Living  Outcome: Progressing     Problem: Fall Injury Risk  Goal: Absence of Fall and Fall-Related Injury  Outcome: Progressing

## 2019-12-15 NOTE — Unmapped (Signed)
[name] Gross' VSS, afebrile overnight. Remains on RA. No PRNs given. R. FA PIV dressing changed overnight. Slight edema in R arm, unchanged from day shift. Patient reports no pain. PIV flushes well and BR noted with labs at 0440. Infusing IVF per orders. NGT c/d/I, patient not able to meet 65mL/hr goal this shift d/t discomfort and increased WOB with feeds d/t gross ascites. MD to bedside to assess,  feeds paused and then re-started to previous infusion of 49mL/hr. Bumped to 13mL/hr at 0300, but patient not tolerating well so bumped back to 59mL/hr at 0530. Good UOP noted about Q6hr overnight, urine remains amber. Mom at bedside, active in cares. Will continue to monitor.   Problem: Adult Inpatient Plan of Care  Goal: Plan of Care Review  Outcome: Ongoing - Unchanged     Problem: Adult Inpatient Plan of Care  Goal: Patient-Specific Goal (Individualization)  Outcome: Ongoing - Unchanged     Problem: Adult Inpatient Plan of Care  Goal: Absence of Hospital-Acquired Illness or Injury  Outcome: Ongoing - Unchanged     Problem: Adult Inpatient Plan of Care  Goal: Optimal Comfort and Wellbeing  Outcome: Ongoing - Unchanged

## 2019-12-15 NOTE — Unmapped (Signed)
Pt with VSS this shift, no c/o pain.  Ride side abdomen tender to palpation.  PIV intact, dressing has drainage but remains occlusive and arm is slightly swollen around insertion site but still flushes well and pt reports no pain.  He continues to have +2 edema in the  b/l ankles and feet.  Voided x1 this shift, nephrology collected sample.  Feeds increased to 60cc.  Mom bedside, her and pt included in and updated on POC. WCTM.   Problem: Adult Inpatient Plan of Care  Goal: Plan of Care Review  Outcome: Ongoing - Unchanged  Goal: Patient-Specific Goal (Individualization)  Outcome: Ongoing - Unchanged  Goal: Absence of Hospital-Acquired Illness or Injury  Outcome: Ongoing - Unchanged  Goal: Optimal Comfort and Wellbeing  Outcome: Ongoing - Unchanged  Goal: Readiness for Transition of Care  Outcome: Ongoing - Unchanged  Goal: Rounds/Family Conference  Outcome: Ongoing - Unchanged     Problem: Skin Injury Risk Increased  Goal: Skin Health and Integrity  Outcome: Ongoing - Unchanged     Problem: Self-Care Deficit  Goal: Improved Ability to Complete Activities of Daily Living  Outcome: Ongoing - Unchanged     Problem: Fall Injury Risk  Goal: Absence of Fall and Fall-Related Injury  Outcome: Ongoing - Unchanged

## 2019-12-15 NOTE — Unmapped (Signed)
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 Overall clinical decline likely due to worsening hepatic dysfunction in the setting of chronic rejection, which has been determined to be related to medication nonadherence.    Seen in consultation at the request of Dr. Morrison Old, MD for cirrhosis    Interval History:  - tube feeds at 60 mLs/hr  - [name] Gross feels bloated today, is lying in bed, minimally interactive  - EGD showed significant esophagitis, but no varices  - no vomiting        ASSESSMENT / PLAN:     #Concern for UGIB: EGD without varices, no e/o significant GI bleed. Had significant esophagitis, now on fluconazole, renally dosed.   --ok with checking hgb just once daily  --no e/o UGIB on EGD, did have bad esophagitis, which may have caused some oozing       #AKI: Suspect AKI could be related to hypovolemia in the setting of vomiting and poor p.o. intake, could be related to infection, though infectious work-up has been unrevealing.  -appreciate nephrology's input  -serum albumin low, would give 25 grams albumin 25% BID  -[name] Gross is c/o worsening distension, but he is tympanic to percussion. Suspect it is mostly air and not fluid. Would get daily weights, and if weight increases significantly, would u/s patient to look for ascites fluid. Would do therapeutic paracentecis if ascites fluid interfering with ability to take in nutrition or with mechanics of breathing.    #S/p OLT 2017, now w/chronic rejection: Last liver biopsy in October 2020 showed??ductopenia suggestive of chronic rejection, severe hepatocanalicular cholestasis, along with perivenular, periportal and??portal fibrosis.?? Overall clinical decline likely due to worsening hepatic dysfunction in the setting of chronic rejection, which has been determined to be related to medication nonadherence.  -Tacrolimus being held in the setting of renal dysfunction. OK to continue holding as he is on prednisone  -We will monitor for kidney improvement and assess when to restart, appreciate pediatric GI involvement.  -In December was deemed not appropriate transplant candidate due to poor nutrition and frailty. I discussed with mother and patient that [name] Gross needed to improve nutrition and his strength before we could list him. They both verbalized their understanding. [name] Gross is committed to getting nutrition and working on his strength. As of right now, he is not a candidate for transplant.    MELD-Na score: 35 at 12/15/2019  4:40 AM  MELD score: 34 at 12/15/2019  4:40 AM  Calculated from:  Serum Creatinine: 2.38 mg/dL at 11/15/1094  0:45 AM  Serum Sodium: 134 mmol/L at 12/15/2019  4:40 AM  Total Bilirubin: 34.8 mg/dL at 4/0/9811  9:14 AM  INR(ratio): 1.74 at 12/15/2019  4:40 AM  Age: 19 years      Thank you for this consult.  Please page [phone] with questions.    Ace Gins, NP-C  Transplant, Hepatology           Medications:  Current Facility-Administered  Medications   Medication Dose Route Frequency Provider Last Rate Last Admin   ??? albumin human 25 % bottle 25 g  25 g Intravenous BID David Stall, MD       ??? benzoyl peroxide 5 % gel   Topical Nightly David Stall, MD   1 application at 12/14/19 2023   ??? cholecalciferol (vitamin D3) tablet 5,000 Units  5,000 Units Oral Daily David Stall, MD   5,000 Units at 12/15/19 0804   ??? clindamycin (CLEOCIN T) 1 % external solution   Topical Nightly David Stall, MD   1 application at 12/14/19 2023   ??? fluconazole (DIFLUCAN) tablet 100 mg  100 mg Oral Daily David Stall, MD   100 mg at 12/15/19 1610   ??? magnesium oxide (MAG-OX) tablet 400 mg  400 mg Oral BID David Stall, MD   400 mg at 12/15/19 0804   ??? multivitamin, with zinc (AQUADEKS) chewable tablet  2 tablet Oral Daily David Stall, MD   2 tablet at 12/15/19 0804   ??? mycophenolate (MYFORTIC) EC tablet 540 mg  540 mg Oral BID David Stall, MD   540 mg at 12/15/19 0804   ??? ondansetron (ZOFRAN) injection 4 mg  4 mg Intravenous Q8H PRN David Stall, MD   4 mg at 12/12/19 0128   ??? pantoprazole (PROTONIX) EC tablet 40 mg  40 mg Oral BID Morrison Old, MD   40 mg at 12/15/19 0805   ??? potassium chloride (KLOR-CON) CR tablet 20 mEq  20 mEq Oral BID David Stall, MD   20 mEq at 12/15/19 0804   ??? predniSONE (DELTASONE) tablet 20 mg  20 mg Oral Daily David Stall, MD   20 mg at 12/15/19 0804   ??? rifAXIMin (XIFAXAN) tablet 550 mg  550 mg Oral BID David Stall, MD   550 mg at 12/15/19 0805   ??? spironolactone (ALDACTONE) tablet 50 mg  50 mg Oral Daily David Stall, MD   50 mg at 12/15/19 0804   ??? valGANciclovir (VALCYTE) tablet 450 mg  450 mg Oral Q48H David Stall, MD   450 mg at 12/15/19 0804       Vital Signs:  Temp:  [36.7 ??C (98.1 ??F)-36.8 ??C (98.2 ??F)] 36.8 ??C (98.2 ??F)  Heart Rate:  [87-94] 94  SpO2 Pulse:  [93] 93  Resp:  [16-26] 26  BP: (107-114)/(59-61) 107/59  MAP (mmHg):  [73] 73  SpO2:  [100 %] 100 %    Intake/Output last 3 shifts:  I/O last 3 completed shifts:  In: 3592.6 [P.O.:240; I.V.:1412.6; NG/GT:1740; IV Piggyback:200]  Out: 1275 [Urine:1275]    Physical Exam:  General appearance: Laying in bed, alert and oriented, interactive  HEENT: Icteric sclera  Cardiovascular: Regular rate  Pulmonary: Normal work of breathing. Acyanotic Abdominal: soft, mildly tender throughout, mildly distended, no hepatomegaly, bedside US without significant ascites  Musculoskeletal: + temporal wasting. Normal joints of the hand.  Skin: + jaundice. No rashes.  Neurologic: Alert, oriented, and appropriate. Bilateral hand tremor  Psychiatric: Anxious/irritable.    Diagnostic Studies:   Labs:  Recent Labs     12/13/19  0613 12/13/19  9604 12/14/19  0523 12/14/19  0817 12/15/19  0440   WBC 9.5  --  11.2*  --  12.1*   HGB 7.5*   < > 8.6* 8.1* 8.6*   HCT 22.8*   < > 26.1* 25.3* 25.8*   PLT 122*  --  131*  --  132*    < > = values in this interval not displayed.     Recent Labs     12/13/19  0613 12/14/19  0523 12/15/19  0440   NA 135 137 134*   K 3.3* 2.7* 3.1*   CL 111* 112* 111*   BUN 22* 23* 28*   CREATININE 2.23* 2.40* 2.38*   GLU 144 219* 220*     Recent Labs     12/13/19  0613 12/14/19  0523 12/15/19  0440   ALBUMIN 2.6* 2.4* 2.8*   AST 141* 130* 125*   ALKPHOS 804* 819* 695*   BILITOT  --  34.8* 34.8*     Recent Labs     12/13/19  0414 12/13/19  0613 12/14/19  0523 12/15/19  0440   INR 1.97 1.92 1.82 1.74   APTT 49.0*  --  58.2* 37.8*   FIBRINOGEN 127*  --  104* 91*       Imaging/Procedures:    US Liver doppler 12/11/19  IMPRESSION:  - Patent hepatic transplant vasculature.  - Interval increase in resistive indices noted throughout the hepatic arteries which are within upper limits of normal/minimally elevated, as above.  - Increased diameter common bile duct. No stone identified.  - Heterogeneous liver echotexture, which is somewhat nonspecific, however, can be seen in setting of underlying liver disease. Slight interval decrease in liver measurement with increased size spleen.  - Large-volume abdominopelvic ascites, similar to prior.  - Bilateral echogenic kidneys, nonspecific, however, can be seen with chronic medical renal disease.

## 2019-12-16 LAB — COMPREHENSIVE METABOLIC PANEL
ALBUMIN: 2.8 g/dL — ABNORMAL LOW (ref 3.5–5.0)
ALKALINE PHOSPHATASE: 650 U/L — ABNORMAL HIGH (ref 65–260)
AST (SGOT): 94 U/L — ABNORMAL HIGH (ref 19–55)
BILIRUBIN TOTAL: 34.8 mg/dL — ABNORMAL HIGH (ref 0.0–1.2)
BLOOD UREA NITROGEN: 41 mg/dL — ABNORMAL HIGH (ref 7–21)
BUN / CREAT RATIO: 18
CALCIUM: 8.8 mg/dL (ref 8.5–10.2)
CHLORIDE: 110 mmol/L — ABNORMAL HIGH (ref 98–107)
CREATININE: 2.27 mg/dL — ABNORMAL HIGH (ref 0.70–1.30)
EGFR CKD-EPI AA MALE: 47 mL/min/{1.73_m2} — ABNORMAL LOW (ref >=60)
EGFR CKD-EPI NON-AA MALE: 40 mL/min/{1.73_m2} — ABNORMAL LOW (ref >=60)
POTASSIUM: 2.8 mmol/L — ABNORMAL LOW (ref 3.5–5.0)
PROTEIN TOTAL: 5.3 g/dL — ABNORMAL LOW (ref 6.5–8.3)
SODIUM: 138 mmol/L (ref 135–145)

## 2019-12-16 LAB — CBC W/ AUTO DIFF
BASOPHILS ABSOLUTE COUNT: 0.1 10*9/L (ref 0.0–0.1)
BASOPHILS RELATIVE PERCENT: 0.5 %
EOSINOPHILS ABSOLUTE COUNT: 0 10*9/L (ref 0.0–0.4)
EOSINOPHILS RELATIVE PERCENT: 0.1 %
HEMATOCRIT: 25.3 % — ABNORMAL LOW (ref 41.0–53.0)
HEMOGLOBIN: 8.5 g/dL — ABNORMAL LOW (ref 13.5–17.5)
LARGE UNSTAINED CELLS: 1 % (ref 0–4)
LYMPHOCYTES ABSOLUTE COUNT: 0.7 10*9/L — ABNORMAL LOW (ref 1.5–5.0)
LYMPHOCYTES RELATIVE PERCENT: 5 %
MEAN CORPUSCULAR HEMOGLOBIN CONC: 33.6 g/dL (ref 31.0–37.0)
MEAN CORPUSCULAR HEMOGLOBIN: 30.7 pg (ref 26.0–34.0)
MEAN CORPUSCULAR VOLUME: 91.5 fL (ref 80.0–100.0)
MEAN PLATELET VOLUME: 8 fL (ref 7.0–10.0)
MONOCYTES ABSOLUTE COUNT: 0.6 10*9/L (ref 0.2–0.8)
MONOCYTES RELATIVE PERCENT: 4.2 %
NEUTROPHILS ABSOLUTE COUNT: 11.9 10*9/L — ABNORMAL HIGH (ref 2.0–7.5)
PLATELET COUNT: 113 10*9/L — ABNORMAL LOW (ref 150–440)
RED BLOOD CELL COUNT: 2.76 10*12/L — ABNORMAL LOW (ref 4.50–5.90)
RED CELL DISTRIBUTION WIDTH: 24.1 % — ABNORMAL HIGH (ref 12.0–15.0)
WBC ADJUSTED: 13.4 10*9/L — ABNORMAL HIGH (ref 4.5–11.0)

## 2019-12-16 LAB — CHLORIDE: Chloride:SCnc:Pt:Ser/Plas:Qn:: 110 — ABNORMAL HIGH

## 2019-12-16 LAB — LYMPHOCYTES ABSOLUTE COUNT: Lymphocytes:NCnc:Pt:Bld:Qn:Automated count: 0.7 — ABNORMAL LOW

## 2019-12-16 LAB — MAGNESIUM: Magnesium:MCnc:Pt:Ser/Plas:Qn:: 2.2

## 2019-12-16 LAB — PHOSPHORUS: Phosphate:MCnc:Pt:Ser/Plas:Qn:: 3.4

## 2019-12-16 NOTE — Unmapped (Signed)
Shiraz' VSS, afebrile overnight. Remains on RA. No PRNs given. R. FA PIV c/d/I. Saline locked overnight. Infused albumin per orders at 0600. NGT c/d/I, infusing Vital AF @60mL /hr overnight with Q6hr FWF. Tolerating well, no emesis noted. Gross ascites remain unchanged; weight significantly increased from last documentation. MD Vickey Sages notified, no new orders for RN. Good UOP Q6hr overnight, urine remains amber. Stool remains loose. Patient refused midnight POC glucose check, MD Jimmey Ralph notified. 0600 glucose check to be done from PIV with other labs. Mom at bedside, active in cares. Will continue to monitor.   Problem: Adult Inpatient Plan of Care  Goal: Plan of Care Review  Outcome: Ongoing - Unchanged     Problem: Adult Inpatient Plan of Care  Goal: Patient-Specific Goal (Individualization)  Outcome: Ongoing - Unchanged     Problem: Adult Inpatient Plan of Care  Goal: Absence of Hospital-Acquired Illness or Injury  Outcome: Ongoing - Unchanged     Problem: Adult Inpatient Plan of Care  Goal: Optimal Comfort and Wellbeing  Outcome: Ongoing - Unchanged

## 2019-12-16 NOTE — Unmapped (Signed)
Pediatric Daily Progress Note     Assessment/Plan:     Principal Problem:    AKI (acute kidney injury) (CMS-HCC)  Active Problems:    History of liver transplant (CMS-HCC)    Major depressive disorder    Liver transplant rejection (CMS-HCC)  Resolved Problems:    * No resolved hospital problems. *    [name] Gross is a 19 y.o. 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 He is currently in stable condition. He requires continued care in the hospital for management of his AKI/ATN, acute on chronic liver rejection, electrolyte derangements, and overall nutritional status. Given the patient's marked weight loss on presentation and poor PO intake  he requires continued NG tube feeds with an adjusted goal of 60mL (held below goal given abdominal discomfort likely due to ascites). He was previously receiving mIVF for management of ATN, but this was discontinued in the setting of increased continuous enteral feeds. Today, patient continues to show signs of volume overload, including marked pedal edema, and weight 16lb [place] since admission, for which we will continue albumin to assist with oncotic pressure and monitor fluid status closely.from a renal standpoint, his creatinine seems to have plateaud and is now down and urine output now improved. We will continue to monitor for a post-AKI diuresis and consider fluids per nephro recs if needed. Today [name] Gross refused all care including medications, after talking with [name] Gross he was open to taking all his medications through his NG. We will continue to work with the patient and family to clarify his goals for care.       Acute Kidney Injury: Likely due to volume depletion in the setting of coffee ground emesis and tacrolimus use. Urine microscopy showing muddy brown casts c/w ATN. Renal function improving, though fluid overloaded.   - Peds nephrology following, appreciate recs.    - Per peds nephro, will consider restarting fluids if pt reaches net negative >500-700 mL. Currently net +185   - Daily weights  - Renal U/S completed 2/3 - No hydronephrosis, renal parenchyma similar to prior  - Creatinine trend: 2/2 - 2.23, 2/3 - 2.4, 2/4 - 2.38, 2/5 - 2.27 (prior to admission, creatinine 1.07-1.78)  - Medications are renally-dosed   - History of decreased SBP to 80's likely due to fluid depletion; stable since transfer     FEN/GI  - Strict I/O  - Albumin 25mg  BID-> 1 dose today and reinitiate as neede   - Albumin goal >3  - Increase K Phos PO to 500mg  qid   - K at 2.8 on 2/4 from 3.1 on 2/4  - Start calcium carbonate 600mg  TID for bone health   - f/u ionized calcium   - Continuous feeds at 108ml/hr with updated TFG of 22ml/hr, given pt's inability to tolerate increase in feed goal given abdominal discomfort               - goal is to incrase by 10 ml/hr q4hr as tolerated to 100ml/hr               - Flush min of 30 ml q6hr to maintain patency of tube   - Regular diet with calorie count  - Vitamin D3 5000mg  qd   -  IV Zofran 4mg  q8h PRN  - IV Protonix 40mg  BID  - Continue home aquadeks, mag-ox, vit D3  - f/u labs: 1,25-hydroxyvitamin D, 25-hydroxyvitamin D, ionized calcium, in the setting of elevated alk phos (650 on 2/5)     Candidal Esophagitis: Confirmed on EGD 2/2, with no varices identified in the setting of coffee-ground emesis.  - Fluconazole 100mg  every day (loading dose 400mg  given 2/2) for 13 days   - S/p vancomycin, cefepime and flagyl due to initial concern for SBP which is now unlikely               - NGTD on peritoneal cultures (1/31)    ??  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 Currently being considered for repeat transplant.  - Peds GI and adult hepatology following  - f/u ammonia level given MOP report of confusion upon awakening, and decreased alertness on exam this AM  - Continue to hold tacrolimus and bactrim prophylaxis in setting of AKI  - Spironolactone 50mg  daily  - Mycophenolate 540mg  BID  - Rifaximin 550mg  BID  - Valganciclovir daily   - Prednisone 20mg  daily;               - s/p stress dosing in PICU     Heme: History of DVT in RUE now resolved on PVL 2/5, no signs of DVT during current admission.   - Fibrinogen low at 91. No interventions required at this time per adult hepatology. Will continue to defer to adult hepatology recs.   - CBC daily    - Leukocytosis (12.1 on 2/4) likely 2/2 prednisone  - SCD's in place   - Will check INR every day    - INR 1.74 on 2/4     Psych:  - Psychiatry following, appreciate recs. Per Psych, patient's presentation of mood symptoms is consistent with MDD. Pt interested in starting Lexapro, but will hold until more medically stable.   - CBT with Psychology 1x/week  - Hydroxyzine 25mg  PO q6h PRN for anxiety and sleep   - Baseline EKG wnl with normal QTc interval  - Thiamine PO 200 mg TID x5 days, then transition to 100 mg daily   - Folate supplementation and multivitamin daily.  - Continue delirium protocol, and encouraged movement and orientation strategies    Endocrine: Patient with hyperglycemia (220 on 2/4) in setting of prednisone.   - Adult Endocrinology consulted, appreciate recs   - POC BG monitoring q6 hrs while on TF. Notably, patient declined POC testing on 2/4 and 2/5, so BG had to be drawn from IV.    - Regular insulin sensitive correctional scale q6h while on TF. If he does oral feeds, will consider Lispro with oral feeding  - Goal BG of 140-180 while inpatient    Social:  - Consult Palliative Care to clarify patient's goals for admission  - arrange family meeting for 2/6 to discuss goals of care  ??  Lines: PIV, NGT  ??  Plan of care discussed with caregiver(s) at bedside.    Subjective:     Interval History: [name] Gross states that he is doing fine. History from patient was limited due to being sleepy. MOP states that last night, patient did not want to have his blood glucose checked or receive insulin. She reports that he has voiced concerns before about being around his friends and having to get stuck and says that she has reassured  him that he will not have to be on steroids long-term. She reports that otherwise, patient had an uneventful night. He has been urinating more frequently today. MOP says he's just over it. Denies emesis, nausea, pain.    Objective:     Vital signs in last 24 hours:  Temp:  [36.8 ??C-37 ??C] 37 ??C  Heart Rate:  [83-99] 84  SpO2 Pulse:  [88] 88  Resp:  [18-22] 18  BP: (115-128)/(63-82) 128/82  MAP (mmHg):  [78-96] 96  SpO2:  [99 %-100 %] 100 %  Intake/Output last 3 shifts:  I/O last 3 completed shifts:  In: 3110 [P.O.:870; I.V.:130; NG/GT:2010; IV Piggyback:100]  Out: 1755 [Urine:1755]    Physical Exam:  General: Lying in bed comfortably, sleeping, responsive, in no acute distress, cooperative with exam  Head: atraumatic, normocephalic  Eyes: Scleral icterus present;  extraocular movements intact  Nose: Clear, no discharge, NG tube in place  Neck:  Full range of motion, supple, mild  Lungs: Clear to auscultation, no wheezing, crackles or rhonchi, breathing unlabored  Heart: Regular rate and rhythm, normal S1, S2, no murmurs or gallops.  Abdomen: Prior surgical scars present, abdomen soft, non-tender. Ascitic abdomen with fluid wave present. Active bowel sounds. TTP in RLQ and central lower abdomen.  Neuro: Tremor at rest and with movement on UE bilaterally. AAO to person, place, year.Aterixis present.   Extremities:  2+ non-pitting pedal edema present bilaterally, same as yesterday. Moves all extremities. Edema of the right elbow/forearm.   Skin: rash/acne over the face. Normal skin turgor; no bruising, rashes or lesions noted    Studies: Personally reviewed and interpreted.    Labs/Studies:  Labs and Studies from the last 24hrs per EMR and Reviewed and   All lab results last 24 hours:    Recent Results (from the past 24 hour(s))   POCT Glucose    Collection Time: 12/16/19  5:36 AM   Result Value Ref Range    Glucose, POC 175 70 - 179 mg/dL   Comprehensive Metabolic Panel    Collection Time: 12/16/19  5:38 AM   Result Value Ref Range    Sodium 138 135 - 145 mmol/L    Potassium 2.8 (L) 3.5 - 5.0 mmol/L    Chloride 110 (H) 98 - 107 mmol/L    Anion Gap      CO2      BUN 41 (H) 7 - 21 mg/dL    Creatinine 1.61 (H) 0.70 - 1.30 mg/dL    BUN/Creatinine Ratio 18     EGFR CKD-EPI Non-African American, Male 40 (L) >=60 mL/min/1.80m2    EGFR CKD-EPI African American, Male 47 (L) >=60 mL/min/1.10m2    Glucose 167 70 - 179 mg/dL    Calcium 8.8 8.5 - 09.6 mg/dL    Albumin 2.8 (L) 3.5 - 5.0 g/dL    Total Protein 5.3 (L) 6.5 - 8.3 g/dL    Total Bilirubin 04.5 (H) 0.0 - 1.2 mg/dL    AST 94 (H) 19 - 55 U/L    ALT      Alkaline Phosphatase 650 (H) 65 - 260 U/L   Magnesium Level    Collection Time: 12/16/19  5:38 AM   Result Value Ref Range    Magnesium 2.2 1.6 - 2.2 mg/dL   Phosphorus Level    Collection Time: 12/16/19  5:38 AM   Result Value Ref Range    Phosphorus 3.4 2.9 - 4.7 mg/dL   CBC w/ Differential    Collection Time:  12/16/19  5:38 AM   Result Value Ref Range    WBC 13.4 (H) 4.5 - 11.0 10*9/L    RBC 2.76 (L) 4.50 - 5.90 10*12/L    HGB 8.5 (L) 13.5 - 17.5 g/dL    HCT 16.1 (L) 09.6 - 53.0 %    MCV 91.5 80.0 - 100.0 fL    MCH 30.7 26.0 - 34.0 pg    MCHC 33.6 31.0 - 37.0 g/dL    RDW 04.5 (H) 40.9 - 15.0 %    MPV 8.0 7.0 - 10.0 fL    Platelet 113 (L) 150 - 440 10*9/L    Variable HGB Concentration Marked (A) Not Present    Neutrophils % 89.2 %    Lymphocytes % 5.0 %    Monocytes % 4.2 %    Eosinophils % 0.1 %    Basophils % 0.5 %    Neutrophil Left Shift 2+ (A) Not Present    Absolute Neutrophils 11.9 (H) 2.0 - 7.5 10*9/L    Absolute Lymphocytes 0.7 (L) 1.5 - 5.0 10*9/L    Absolute Monocytes 0.6 0.2 - 0.8 10*9/L    Absolute Eosinophils 0.0 0.0 - 0.4 10*9/L    Absolute Basophils 0.1 0.0 - 0.1 10*9/L    Large Unstained Cells 1 0 - 4 %    Microcytosis Moderate (A) Not Present    Macrocytosis Moderate (A) Not Present    Anisocytosis Marked (A) Not Present    Hyperchromasia Slight (A) Not Present    Hypochromasia Marked (A) Not Present     ========================================  Markus Jarvis, MS3    I saw and evaluated the patient, participating in the key portions of the service.?? I reviewed the resident???s note.?? I agree with the resident???s findings and plan.     Gildardo Griffes, MD  Pediatrics, PGY-3  Pager: [phone]        I performed a history and physical examination of the patient and discussed the patient's management with the family and the resident. I personally reviewed the results of all tests, including blood work and images. I reviewed and edited the resident's note and agree with the documented findings and plan of care. I provided direct supervision to the care of the patient. I spent over 50% of a total 35 min counseling and/or coordinating the care of this patient.     Rocco Pauls, MD  Pediatric Gastroenterology

## 2019-12-16 NOTE — Unmapped (Signed)
Pediatric Palliative Care Consult Note    [name] Gross is a 19 y.o. male seen for pediatric palliative care consultation at the request of Dr. Andi Gross for family support.    Primary Care Provider: [name], MD    History provided by: [name] Gross and [name] Gross    Assessment and Summary of Discussion:   [name] Gross is a 19 y.o. 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  [name] Gross is followed by [facility] hepatology teams as an outpatient and was being worked up for possible repeat liver transplant; now on hold due to acute illness, overall decline in health, and need for hospitalization.  Palliative care consult requested for family support.    Recommendations:   1. SYMPTOMS:   - Pain/discomfort: [name] Gross reported abdominal pain r/t acites, worsened by NG feeds.  He reported decreasing the volume of his feeds to be most helpful.   - FEN/GI: S/p EGD on 2/2 w/ candidal esophagitis, no varices - treating with fluconazole (100 mg daily) and pantoprazole (40 mg BID).    - Given [name] Gross' overall poor nutritional status; weight loss, decreased PO intake, NG feeds were initiated.  Able to have regular diet w/ calorie count as tolerated.  Denies nausea.  Zofran available PRN.  Peritoneal cultures on 1/31 NGTD.   - Mood: H/o major depressive disorder.  [name] Gross was willing to engage with our team but continues to appear very withdrawn.  ?? Appreciate involvement of psychiatry.  ?? Dr. Neale Gross had visit with [name] Gross today.  See note.  - Sleep disturbance: Difficulty sleeping reported; mostly attributed to interruptions at night.  [name] Gross reported he tries to take a nap(s) during the day to make up for lost sleep at night.  ?? Recommend clustering care to allow  for periods of uninterrupted sleep when able to do so safely.      2. GOALS:   - [name] Gross prefers consistency with information and care when possible.  - Mother is concerned about [name] Gross' mental health and appreciates psychiatry's continued involvement.  - [name] Gross has long term goals which include going back to school and becoming a Runner, broadcasting/film/video or nurse.    3. DECISIONS:   - Not facing immediate medical decisions at this time but will face difficult decisions regarding future care, including repeat transplant if he is a candidate.    4. ADVANCE CARE PLANNING  - Code status: Full code.  - Prognosis: Guarded given severity of liver disease and potential for lack of response to salvage therapies, as well as AKI, and complex social situation.    5. SUPPORT:   - [name] Gross has expanded his support network by reconnecting with many family members who have expressed their desire to support him and assist with his care.  He is currently living with his mother who is present with him during this hospitalization and is active in his care.  She is working to balance her role as mother with [name] Gross' right to autonomy and the right to direct his own care.  - Discussed hospital based resources.    6. CARE COORDINATION:   - Care coordinated with psychiatry and nursing.  - Supportive care team available for team / family meetings as needed.    Total time spent with patient for evaluation & management (excluding ACP  documented separately): 15 minutes.  Greater than 50% of this time spent on counseling/coordination of care: Yes.  See ACP Note from today for additional billable service: No.      We appreciate the consult. The Children's Supportive Care Team can be reached by pager [phone] [name] Gross) or email [name]).       History of Present Illness:   [name] Gross is a 19 y.o. 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  Etiology of coffee ground emesis thought to be secondary to esophageal varices in context of longstanding portal hypertension.  [name] Gross was being evaluated for repeat liver transplant prior to this hospitalization.    Interval History: S/p EGD on 2/2.  Being treated for candidal esophagitis.  Receiving NG feeds to support nutrition in setting of poor PO intake.  Remains on broad spectrum antibiotics in context of GI bleed and risk of SBP; blood cultures on 1/30 and peritoneal cultures on 1/31 NGTD.  Pediatric and adult hepatology teams and psychiatry following.    Dr. Neale Gross came to meet with [name] Gross while we were visiting with him.  Supportive care team agreed to prioritize [name] Gross' mental health and return at a later time to continue addressing needs/concerns.  He was reminded of our team's ability to be paged if there were urgent issues that needed to be addressed sooner than later.     Allergies:  Bee pollen and Pollen extracts    Medications:  Scheduled Meds:  ??? albumin human  25 g Intravenous BID   ??? benzoyl peroxide   Topical Nightly   ??? cholecalciferol (vitamin D3)  5,000 Units Oral Daily   ??? clindamycin   Topical Nightly   ??? fluconazole  100 mg Oral Daily   ??? magnesium oxide  400 mg Oral BID   ??? melatonin  3 mg Oral QPM   ??? multivitamin, with zinc  2 tablet Oral Daily   ??? mycophenolate  540 mg Oral BID   ??? pantoprazole  40 mg Oral BID   ??? potassium phosphate (monobasic)  500 mg Oral BID   ??? predniSONE  20 mg Oral Daily   ??? rifAXIMin  550 mg Oral BID   ??? spironolactone  50 mg Oral Daily   ??? valGANciclovir  450 mg Oral Q48H     Continuous Infusions:    PRN Meds:.hydroxyzine, ondansetron      Physical Exam:   Temp:  [36.2 ??C (97.1 ??F)-36.8 ??C (98.2 ??F)] 36.2 ??C (97.1 ??F)  Heart Rate:  [79-94] 79  SpO2 Pulse:  [93] 93  Resp:  [22-26] 22  SpO2:  [100 %] 100 %  BP: (107-117)/(59-78) 117/78  MAP (mmHg):  [73-91] 91     Gen: thin adolescent male, lying in bed, soft spoken, polite, NAD.  HEENT: sclera icteric, [place]/AT.  CV: RRR per monitor.  Resp: normal WOB on RA.  Neuro: awake/alert, oriented X 3. appropriate, clear speech, +tremors of hands/UE's.    Data: Reviewed in Epic.    La Lindwood Qua, [place], CPNP  Children's Supportive Care Team  [phone] (pager)    Teaching Physician Attestation  I was the supervising physician in the delivery of the service and was available during the care and recommendations provided by Jennette Dubin, DNP.  Dan Humphreys, MD, MPH, FAAP  Clinical Assistant Professor  Pediatric Palliative Care  [facility]

## 2019-12-16 NOTE — Unmapped (Signed)
Endocrinology Inpatient Chart Review Note - Full consult to follow    Requesting Attending Physician :  Morrison Old, MD  Service Requesting Consult : Ped Gastroenterology [facility])  Primary Care Provider: [name], MD  Outpatient Endocrinologist: N/A    Assessment/Recommendations:  [name] Gross is a 19 y.o. male with h/o liver transplant admitted for AKI (acute kidney injury) (CMS-HCC). Endocrinology have been consulted to evaluate patient for hyperglycemia.    1) Steroid induced hyperglycemia: Currently on prednisone 15mg  with mild hyperglycemia. No current POC BG trends to follow. Elevation noted on chemistry today. Historically, his insulin needs vary greatly depending on the steroid dosing. He is on a lower dose than many recent admissions. His glycemic control will be made more complicated by tube feeding. He has a regular diet ordered but is not eating much at current.  -please start POC BG monitoring ACHS  -recommend starting regular insulin sensitive correctional scale Q6hrs  -will determine basal insulin need based on correctional need  -goal BG is 140-180 while inpatient.    The patient was seen and discussed with attending, Dr. Naoma Diener.    Thank you for the consult. We will continue to follow patient as needed. Please call/page [phone] with questions or concerns.     --  Glean Hess, MD  Endocrinology Fellow [facility] Endocrinology at [facility]  Phone: [phone]   Fax: [phone]        History of Present Illness: :    Reason for Consult: Hyperglycemia    [name] Gross is a 19 y.o. male with h/o liver transplant admitted for AKI (acute kidney injury) (CMS-HCC). Endocrinology have been consulted to evaluate patient for hyperglycemia.    Patient is a 19 yo with a complicated medical history with current admission to [facility] for acute renal failure. He presented to an outside hospital [facility]) with several weeks of decreased oral intake and coffee ground emesis. He was found to be oliguric with a creatinine of 2.7 concerning for prerenal renal injury vs hepatorenal syndrome. 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 He is s/p stress dosed steroids in PICU. Currently on 15mg  prednisone. He will be started on TF based on review of pediatrics notes.     He has been NPO since admission.         Review of Systems:  A 12 system review of systems was negative except as noted in HPI.    Past Medical History:    Medical History:  Past Medical History:   Diagnosis Date   ??? 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 Depression    ??? MSSA bacteremia 08/27/2019   ??? Seasonal allergies    ??? Sickle cell trait (CMS-HCC)    ??? Strep Mitis Central line-associated bloodstream infection 08/27/2019       Surgical History:  Past Surgical History:   Procedure Laterality Date   ??? FRENULECTOMY, LINGUAL     ??? PR ERCP REMOVE FOREIGN BODY/STENT BILIARY/PANC DUCT N/A 08/20/2017    Procedure: ENDOSCOPIC RETROGRADE CHOLANGIOPANCREATOGRAPHY (ERCP); W/ REMOVAL OF FOREIGN BODY/STENT FROM BILIARY/PANCREATIC DUCT(S);  Surgeon: Vonda Antigua, MD;  Location: GI PROCEDURES [facility]OR[facility];  Service: Gastroenterology   ??? PR ERCP REMOVE FOREIGN BODY/STENT BILIARY/PANC DUCT N/A 06/04/2018    Procedure: ENDOSCOPIC RETROGRADE CHOLANGIOPANCREATOGRAPHY (  ERCP); W/ REMOVAL OF FOREIGN BODY/STENT FROM BILIARY/PANCREATIC DUCT(S);  Surgeon: Chriss Driver, MD;  Location: GI PROCEDURES MEMORIAL [facility];  Service: Gastroenterology   ??? PR ERCP STENT PLACEMENT BILIARY/PANCREATIC DUCT N/A 11/21/2016    Procedure: ENDOSCOPIC RETROGRADE CHOLANGIOPANCREATOGRAPHY (ERCP); WITH PLACEMENT OF ENDOSCOPIC STENT INTO BILIARY OR PANCREATIC DUCT;  Surgeon: Vonda Antigua, MD;  Location: GI PROCEDURES [facility];  Service: Gastroenterology   ??? PR ERCP,W/REMOVAL STONE,BIL/PANCR DUCTS N/A 08/20/2017    Procedure: ERCP; W/ENDOSCOPIC RETROGRADE REMOVAL OF CALCULUS/CALCULI FROM BILIARY &/OR PANCREATIC DUCTS;  Surgeon: Vonda Antigua, MD;  Location: GI PROCEDURES MEMORIAL [facility];  Service: Gastroenterology   ??? PR ERCP,W/REMOVAL STONE,BIL/PANCR DUCTS N/A 06/04/2018    Procedure: ERCP; W/ENDOSCOPIC RETROGRADE REMOVAL OF CALCULUS/CALCULI FROM BILIARY &/OR PANCREATIC DUCTS;  Surgeon: Chriss Driver, MD;  Location: GI PROCEDURES [facility];  Service: Gastroenterology   ??? PR TRANSPLANT LIVER,ALLOTRANSPLANT N/A 09/22/2016    Procedure: LIVER ALLOTRANSPLANTATION; ORTHOTOPIC, PARTIAL OR WHOLE, FROM CADAVER OR LIVING DONOR, ANY AGE;  Surgeon: Doyce Loose, MD;  Location: MAIN OR [facility];  Service: Transplant   ??? PR TRANSPLANT,PREP DONOR LIVER, WHOLE N/A 09/22/2016    Procedure: [facility] STD PREP CAD DONOR WHOLE LIVER GFT PRIOR TNSPLNT,INC CHOLE,DISS/REM SURR TISSU WO TRISEG/LOBE SPLT;  Surgeon: Doyce Loose, MD;  Location: MAIN OR [facility];  Service: Transplant   ??? PR UPGI ENDOSCOPY W/US FN BX N/A 08/20/2017    Procedure: UGI W/ TRANSENDOSCOPIC ULTRASOUND GUIDED INTRAMURAL/TRANSMURAL FINE NEEDLE ASPIRATION/BIOPSY(S), ESOPHAGUS;  Surgeon: Vonda Antigua, MD;  Location: GI PROCEDURES MEMORIAL [facility];  Service: Gastroenterology   ??? PR UPPER GI ENDOSCOPY,BIOPSY N/A 07/15/2016    Procedure: UGI ENDOSCOPY; WITH BIOPSY, SINGLE OR MULTIPLE;  Surgeon: Arnold Long Mir, MD;  Location: PEDS PROCEDURE ROOM [facility];  Service: Gastroenterology   ??? PR UPPER GI ENDOSCOPY,CTRL BLEED Left 05/05/2016    Procedure: UGI ENDOSCOPY; WITH CONTROL OF BLEEDING, ANY METHOD;  Surgeon: Arnold Long Mir, MD;  Location: PEDS PROCEDURE ROOM [facility];  Service: Gastroenterology   ??? PR UPPER GI ENDOSCOPY,CTRL BLEED N/A 05/12/2016    Procedure: UGI ENDOSCOPY; WITH CONTROL OF BLEEDING, ANY METHOD;  Surgeon: Annie Paras, MD;  Location: CHILDRENS OR [facility];  Service: Gastroenterology   ??? PR UPPER GI ENDOSCOPY,DIAGNOSIS N/A 09/21/2019    Procedure: UGI ENDO, INCLUDE ESOPHAGUS, STOMACH, & DUODENUM &/OR JEJUNUM; DX W/WO COLLECTION SPECIMN, BY BRUSH OR WASH;  Surgeon: Monte Fantasia, MD;  Location: GI PROCEDURES [facility];  Service: Gastroenterology   ??? PR UPPER GI ENDOSCOPY,DIAGNOSIS N/A 12/13/2019    Procedure: UGI ENDO, INCLUDE ESOPHAGUS, STOMACH, & DUODENUM &/OR JEJUNUM; DX W/WO COLLECTION SPECIMN, BY BRUSH OR WASH;  Surgeon: Neysa Hotter, MD;  Location: GI PROCEDURES [facility]OR[facility];  Service: Gastroenterology   ??? PR UPPER GI ENDOSCOPY,TUBE PLACE N/A 12/13/2019    Procedure: UGI ENDO; W/TRANSENDOSCOPIC TUBE/CATH PLCMT;  Surgeon: Neysa Hotter, MD;  Location: GI PROCEDURES [facility];  Service: Gastroenterology       Allergies:  Bee pollen and Pollen extracts    All Medications:   Current Facility-Administered Medications   Medication Dose Route Frequency Provider Last Rate Last Admin   ??? albumin human 25 % bottle 25 g  25 g Intravenous BID David Stall, MD   25 g at 12/15/19 1519   ??? benzoyl peroxide 5 % gel   Topical Nightly David Stall, MD   1 application at 12/14/19 2023   ??? cholecalciferol (vitamin D3) tablet 5,000 Units  5,000 Units Oral Daily Darden Dates  Gutierrez-Wu, MD   5,000 Units at 12/15/19 0804   ??? clindamycin (CLEOCIN T) 1 % external solution   Topical Nightly David Stall, MD   1 application at 12/14/19 2023   ??? fluconazole (DIFLUCAN) tablet 100 mg  100 mg Oral Daily David Stall, MD   100 mg at 12/15/19 1610   ??? hydroxyzine (ATARAX) capsule/tablet 25 mg  25 mg Oral Q6H PRN Murvin Donning, MD       ??? magnesium oxide (MAG-OX) tablet 400 mg  400 mg Oral BID David Stall, MD   400 mg at 12/15/19 0804   ??? melatonin tablet 3 mg  3 mg Oral QPM Murvin Donning, MD       ??? multivitamin, with zinc (AQUADEKS) chewable tablet  2 tablet Oral Daily David Stall, MD   2 tablet at 12/15/19 0804   ??? mycophenolate (MYFORTIC) EC tablet 540 mg  540 mg Oral BID David Stall, MD   540 mg at 12/15/19 0804   ??? ondansetron (ZOFRAN) injection 4 mg  4 mg Intravenous Q8H PRN David Stall, MD   4 mg at 12/12/19 0128   ??? pantoprazole (PROTONIX) EC tablet 40 mg  40 mg Oral BID Morrison Old, MD   40 mg at 12/15/19 0805   ??? potassium phosphate (monobasic) (K-PHOS) tablet 500 mg  500 mg Oral BID Murvin Donning, MD       ??? predniSONE (DELTASONE) tablet 20 mg  20 mg Oral Daily David Stall, MD   20 mg at 12/15/19 0804   ??? rifAXIMin (XIFAXAN) tablet 550 mg  550 mg Oral BID David Stall, MD   550 mg at 12/15/19 0805   ??? spironolactone (ALDACTONE) tablet 50 mg  50 mg Oral Daily David Stall, MD   50 mg at 12/15/19 0804   ??? thiamine (B-1) tablet 200 mg  200 mg Oral TID David Stall, MD       ??? valGANciclovir (VALCYTE) tablet 450 mg  450 mg Oral Q48H David Stall, MD   450 mg at 12/15/19 0804       Social History:     Social History     Socioeconomic History   ??? Marital status: Single     Spouse name: None   ??? Number of children: None   ??? Years of education: None   ??? Highest education level: None   Occupational History   ??? None   Social Needs   ??? Financial resource strain: None   ??? Food insecurity     Worry: Sometimes true     Inability: Sometimes true   ??? Transportation needs     Medical: None     Non-medical: None   Tobacco Use   ??? Smoking status: Never Smoker   ??? Smokeless tobacco: Never Used   ??? Tobacco comment: Step-father smokes outside   Substance and Sexual Activity   ??? Alcohol use: Never     Alcohol/week: 0.0 standard drinks     Frequency: Never     Comment: 1/2 glass of wine on rare occasions   ??? Drug use: Never   ??? Sexual activity: Never   Lifestyle   ??? Physical activity     Days per week: None     Minutes per session: None   ??? Stress: None   Relationships   ??? Social Psychologist, counselling on phone: None     Gets together: None  Attends religious service: None     Active member of club or organization: None     Attends meetings of clubs or organizations: None     Relationship status: None   Other Topics Concern   ??? Do you use sunscreen? Not Asked   ??? Tanning bed use? No   ??? Are you easily burned? No   ??? Excessive sun exposure? No   ??? Blistering sunburns? No   Social History Narrative    Lives part time with mother and part time with [employer].          Family History:  The patient's family history includes Asthma in his brother; Diabetes in his maternal grandmother and paternal grandmother; Hypertension in his maternal grandfather and paternal grandfather..    Code Status:  Full Code      Objective: :  BP 117/78  - Pulse 79  - Temp 36.2 ??C (Oral)  - Resp 22  - Ht 170.2 cm (5' 7.01)  - Wt 52.9 kg (116 lb 10 oz)  - SpO2 100%  - BMI 18.26 kg/m??     Test Results    Lab Results   Component Value Date    POCGLU 117 09/24/2019    POCGLU 138 09/24/2019    POCGLU 165 09/23/2019    POCGLU 208 (H) 09/23/2019    POCGLU 183 (H) 09/23/2019     Lab Results   Component Value Date    A1C 4.6 (L) 10/24/2019    A1C 5.0 08/17/2019    A1C 4.9 10/13/2018     Lab Results   Component Value Date    CREATININE 2.38 (H) 12/15/2019    NA 134 (L) 12/15/2019    K 3.1 (L) 12/15/2019    CL 111 (H) 12/15/2019    CO2  12/15/2019      Comment:      Icteric specimen.    ANIONGAP  12/15/2019      Comment:      UNABLE TO CALCULATE      CALCIUM 8.3 (L) 12/15/2019    ALBUMIN 2.8 (L) 12/15/2019     Lab Results   Component Value Date    WBC 12.1 (H) 12/15/2019    HGB 8.6 (L) 12/15/2019    PLT 132 (L) 12/15/2019     Lab Results   Component Value Date    AST 125 (H) 12/15/2019    ALT  12/15/2019      Comment:      Icteric specimen.    ALKPHOS 695 (H) 12/15/2019     Lab Results   Component Value Date    TSH 0.797 02/11/2018    FREET4 1.49 02/11/2018     Lab Results   Component Value Date    CHOL 159 09/30/2019    TRIG 79 09/30/2019    HDL 09/30/2019      Comment:      Specimen icteric.      LDL  09/30/2019      Comment:      NHLBI Recommended Ranges, LDL Cholesterol, for Adults (20+yrs) (ATPIII), mg/dL  Optimal              <478  Near Optimal        100-129  Borderline High     130-159  High                160-189  Very High            >=190  NHLBI Recommended Ranges,  LDL Cholesterol, for Children (2-19 yrs), mg/dL  Desirable            <161  Borderline High     110-129  High                 >=130

## 2019-12-16 NOTE — Unmapped (Signed)
Pediatric Nephrology   Consult Note     Requesting Attending Physician :  Morrison Old, MD  Service Requesting Consult : Ped Gastroenterology [facility])    Reason for Consult: AKI    Assessment and Plan:     [name] Gross is a 19 y.o. 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 His AKI was likely worsened by tacrolimus (vasoconstrictive) and his history of multiple previous episodes of kidney injury. Urine microscopy with bile stained muddy brown casts, consistent with ATN (pictured below). His renal ultrasound was unremarkable.    He has a history of AKI, and may take some time to recover from his current AKI. His creatinine is improving and will likely continue to downtrend. His increased BUN is likely related to protein from his enteral nutrition. Continue to monitor for post AKI diuresis. Overall, expect graduate improvement of his AKI in the coming days.     Recommendations:  -- Agree with continuing NG feeds  - monitor I/Os very closely, he is at risk for post AKI diuresis  -- if net negative > 500 ml, then recommend restarting fluids to allow him to be only slightly negative   -- OK to give albumin from a renal standpoint  -- Need to carefully consider timing of re-initiation of tac given nephrotoxicity and risk of rejection.   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 Transferred from OSH due to decreased PO intake and vomiting x 2 weeks. Emesis recently turned darker brown in appearance. No urine output x 24 hours prior to admission. At OSH, initial blood pressures of 60s/40s, HR 90s.  Na 132, K 2.9, Cl 102, BUN 26, Cr 2.7 Received 60 ml/kg of volume resuscitation prior to arrival at [facility]. Home meds include tacrolimus, valcyte,  MMF.     Over the last 24 hours, had 1.5L UOP  FENa 1.8% and FEurea 61% on day of admission.     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    Review of Systems: ten systems reviewed and negative but for that noted in HPI    Medications:     Current Facility-Administered Medications   Medication Dose Route Frequency Provider Last Rate Last Admin   ??? albumin human 25 % bottle 25 g  25 g Intravenous BID David Stall, MD       ??? benzoyl peroxide 5 % gel   Topical Nightly David Stall, MD   1 application at 12/15/19 2017   ??? calcium carbonate (OS-CAL) tablet 600 mg of elem calcium  600 mg of elem calcium Oral TID  David Stall, MD       ??? cholecalciferol (vitamin D3) tablet 5,000 Units  5,000 Units Oral Daily David Stall, MD   5,000 Units at 12/16/19 [phone]   ??? clindamycin (CLEOCIN T) 1 % external solution   Topical Nightly David Stall, MD   1 application at 12/15/19 2017   ??? dextrose 50 % in water (D50W) 50 % solution 12.5 g  12.5 g Intravenous Q10 Min PRN David Stall, MD       ??? fluconazole (DIFLUCAN) tablet 100 mg  100 mg Oral Daily David Stall, MD   100 mg at 12/16/19 9604   ??? hydroxyzine (ATARAX) capsule/tablet 25 mg  25 mg Oral Q6H PRN Murvin Donning, MD       ??? insulin regular (HumuLIN,NovoLIN) injection 0-6 Units  0-6 Units Subcutaneous ACHS David Stall, MD   1 Units at 12/16/19 5409   ??? magnesium oxide (MAG-OX) tablet 400 mg  400 mg Oral BID David Stall, MD   400 mg at 12/16/19 0947   ??? melatonin tablet 3 mg  3 mg Oral QPM Murvin Donning, MD   3 mg at 12/15/19 2017   ??? multivitamin, with zinc (AQUADEKS) chewable tablet  2 tablet Oral Daily David Stall, MD   2 tablet at 12/16/19 8119   ??? mycophenolate (MYFORTIC) EC tablet 540 mg  540 mg Oral BID David Stall, MD   540 mg at 12/16/19 1478   ??? ondansetron (ZOFRAN) injection 4 mg  4 mg Intravenous Q8H PRN David Stall, MD   4 mg at 12/12/19 0128   ??? pantoprazole (PROTONIX) EC tablet 40 mg  40 mg Oral BID Morrison Old, MD   40 mg at 12/16/19 0947   ??? potassium phosphate (monobasic) (K-PHOS) tablet 500 mg  500 mg Oral BID Murvin Donning, MD   500 mg at 12/16/19 2956   ??? predniSONE (DELTASONE) tablet 20 mg  20 mg Oral Daily David Stall, MD   20 mg at 12/16/19 0946   ??? rifAXIMin (XIFAXAN) tablet 550 mg  550 mg Oral BID David Stall, MD   550 mg at 12/16/19 0947   ??? spironolactone (ALDACTONE) tablet 50 mg  50 mg Oral Daily David Stall, MD   50 mg at 12/16/19 2130   ??? thiamine (B-1) tablet 200 mg  200 mg Oral TID David Stall, MD       ??? valGANciclovir (VALCYTE) tablet 450 mg  450 mg Oral Q48H David Stall, MD   450 mg at 12/15/19 8657       Allergies:     Allergies   Allergen Reactions   ??? Bee Pollen Rash     Pt has seasonal allergies that cause excessive sneezing and running nose- [name] Gross, NAII   ??? Pollen Extracts Rash     Pt has seasonal allergies that cause excessive sneezing and running nose- [name] Gross       Past Medical History:     Past Medical History:   Diagnosis Date   ??? 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 Depression    ??? MSSA bacteremia 08/27/2019   ??? Seasonal allergies    ??? Sickle cell trait (CMS-HCC)    ??? Strep Mitis Central line-associated bloodstream infection  08/27/2019 Family History:     Family History   Problem Relation Age of Onset   ??? Diabetes Maternal Grandmother    ??? Diabetes Paternal Grandmother    ??? Hypertension Maternal Grandfather    ??? Hypertension Paternal Grandfather    ??? Asthma Brother    ??? Anesthesia problems Neg Hx    ??? Melanoma Neg Hx    ??? Basal cell carcinoma Neg Hx    ??? Squamous cell carcinoma Neg Hx        Social History:     Social History     Social History Narrative    Lives part time with mother and part time with MGM.        Objective:     BP 115/63  - Pulse 99  - Temp 36.8 ??C (Oral)  - Resp 18  - Ht 170.2 cm (5' 7.01)  - Wt 60.2 kg (132 lb 11.5 oz)  - SpO2 99%  - BMI 20.78 kg/m??   15 %ile (Z= -1.04) based on CDC (Boys, 2-20 Years) weight-for-age data using vitals from 12/16/2019.  18 %ile (Z= -0.92) based on CDC (Boys, 2-20 Years) Stature-for-age data based on Stature recorded on 12/11/2019.    Patient was not in the room for examination    Labs:   No results displayed because visit has over 200 results.          [name] Gross. Venice Marcucci, MD  [facility], PGY3  Pager: [phone]

## 2019-12-16 NOTE — Unmapped (Signed)
Endocrinology Inpatient Consult Note    Requesting Attending Physician :  Morrison Old, MD  Service Requesting Consult : Ped Gastroenterology [facility])  Primary Care Provider: [name], MD  Outpatient Endocrinologist: N/A    Assessment/Recommendations:  [name] Gross is a 19 y.o. male with h/o liver transplant admitted for AKI (acute kidney injury) (CMS-HCC). Endocrinology have been consulted to evaluate patient for hyperglycemia.    1) Steroid induced hyperglycemia: Currently on prednisone 20mg  daily after increase last night. BG in range overall. Has declined POC testing so BG has been drawn off IV. Received 1 unit of correctional. No appetite.  -POC BG monitor Q6 hours on TF  -recommend starting regular insulin sensitive correctional scale Q6hrs while on TF  -If he reconstitutes a diet, will consider lispro with oral feeding.  -goal BG is 140-180 while inpatient.    The patient was seen and discussed with attending, Dr. Naoma Diener.    Thank you for the consult. We will continue to follow patient as needed. Please call/page [phone] with questions or concerns.     --  Glean Hess, MD  Endocrinology Fellow [facility] Endocrinology at [facility]  Phone: [phone]   Fax: [phone]        History of Present Illness: :    Reason for Consult: Hyperglycemia  Interval History:  Declining POC testing last night, declined exam this am. Reports no appetite.    Initial History  Reynolds Q Cheatwood is a 19 y.o. male with h/o liver transplant admitted for AKI (acute kidney injury) (CMS-HCC). Endocrinology have been consulted to evaluate patient for hyperglycemia.    Patient is a 19 yo with a complicated medical history with current admission to [facility] for acute renal failure. He presented to an outside hospital [facility]) with several weeks of decreased oral intake and coffee ground emesis. He was found to be oliguric with a creatinine of 2.7 concerning for prerenal renal injury vs hepatorenal syndrome. 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 He is s/p stress dosed steroids in PICU. Currently on 15mg  prednisone. He will be started on TF based on review of pediatrics notes.     He has been NPO since admission.         Review of Systems:  A 12 system review of systems was negative except as noted in HPI.    Past Medical History:    Medical History:  Past Medical History:   Diagnosis Date   ??? 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 Depression    ??? MSSA bacteremia 08/27/2019   ??? Seasonal allergies    ??? Sickle cell trait (CMS-HCC)    ??? Strep Mitis Central line-associated bloodstream infection 08/27/2019       Surgical History:  Past Surgical History:   Procedure Laterality Date   ??? FRENULECTOMY, LINGUAL     ??? PR ERCP REMOVE FOREIGN BODY/STENT BILIARY/PANC DUCT N/A 08/20/2017    Procedure: ENDOSCOPIC RETROGRADE CHOLANGIOPANCREATOGRAPHY (ERCP); W/ REMOVAL OF FOREIGN BODY/STENT FROM BILIARY/PANCREATIC DUCT(S);  Surgeon: Vonda Antigua, MD;  Location: GI PROCEDURES MEMORIAL [facility];  Service: Gastroenterology   ??? PR ERCP REMOVE FOREIGN BODY/STENT BILIARY/PANC DUCT N/A 06/04/2018    Procedure: ENDOSCOPIC RETROGRADE CHOLANGIOPANCREATOGRAPHY (ERCP); W/ REMOVAL OF FOREIGN BODY/STENT FROM BILIARY/PANCREATIC DUCT(S);  Surgeon: Chriss Driver,  MD;  Location: GI PROCEDURES MEMORIAL [facility];  Service: Gastroenterology   ??? PR ERCP STENT PLACEMENT BILIARY/PANCREATIC DUCT N/A 11/21/2016    Procedure: ENDOSCOPIC RETROGRADE CHOLANGIOPANCREATOGRAPHY (ERCP); WITH PLACEMENT OF ENDOSCOPIC STENT INTO BILIARY OR PANCREATIC DUCT;  Surgeon: Vonda Antigua, MD;  Location: GI PROCEDURES MEMORIAL [facility];  Service: Gastroenterology   ??? PR ERCP,W/REMOVAL STONE,BIL/PANCR DUCTS N/A 08/20/2017    Procedure: ERCP; W/ENDOSCOPIC RETROGRADE REMOVAL OF CALCULUS/CALCULI FROM BILIARY &/OR PANCREATIC DUCTS;  Surgeon: Vonda Antigua, MD;  Location: GI PROCEDURES MEMORIAL [facility];  Service: Gastroenterology   ??? PR ERCP,W/REMOVAL STONE,BIL/PANCR DUCTS N/A 06/04/2018    Procedure: ERCP; W/ENDOSCOPIC RETROGRADE REMOVAL OF CALCULUS/CALCULI FROM BILIARY &/OR PANCREATIC DUCTS;  Surgeon: Chriss Driver, MD;  Location: GI PROCEDURES MEMORIAL [facility];  Service: Gastroenterology   ??? PR TRANSPLANT LIVER,ALLOTRANSPLANT N/A 09/22/2016    Procedure: LIVER ALLOTRANSPLANTATION; ORTHOTOPIC, PARTIAL OR WHOLE, FROM CADAVER OR LIVING DONOR, ANY AGE;  Surgeon: Doyce Loose, MD;  Location: MAIN OR [facility];  Service: Transplant   ??? PR TRANSPLANT,PREP DONOR LIVER, WHOLE N/A 09/22/2016    Procedure: [facility] STD PREP CAD DONOR WHOLE LIVER GFT PRIOR TNSPLNT,INC CHOLE,DISS/REM SURR TISSU WO TRISEG/LOBE SPLT;  Surgeon: Doyce Loose, MD;  Location: MAIN OR [facility];  Service: Transplant   ??? PR UPGI ENDOSCOPY W/US FN BX N/A 08/20/2017    Procedure: UGI W/ TRANSENDOSCOPIC ULTRASOUND GUIDED INTRAMURAL/TRANSMURAL FINE NEEDLE ASPIRATION/BIOPSY(S), ESOPHAGUS;  Surgeon: Vonda Antigua, MD;  Location: GI PROCEDURES MEMORIAL [facility];  Service: Gastroenterology   ??? PR UPPER GI ENDOSCOPY,BIOPSY N/A 07/15/2016    Procedure: UGI ENDOSCOPY; WITH BIOPSY, SINGLE OR MULTIPLE;  Surgeon: Arnold Long Mir, MD;  Location: PEDS PROCEDURE ROOM [facility];  Service: Gastroenterology   ??? PR UPPER GI ENDOSCOPY,CTRL BLEED Left 05/05/2016    Procedure: UGI ENDOSCOPY; WITH CONTROL OF BLEEDING, ANY METHOD;  Surgeon: Arnold Long Mir, MD;  Location: PEDS PROCEDURE ROOM [facility];  Service: Gastroenterology   ??? PR UPPER GI ENDOSCOPY,CTRL BLEED N/A 05/12/2016    Procedure: UGI ENDOSCOPY; WITH CONTROL OF BLEEDING, ANY METHOD;  Surgeon: Annie Paras, MD;  Location: CHILDRENS OR [facility];  Service: Gastroenterology   ??? PR UPPER GI ENDOSCOPY,DIAGNOSIS N/A 09/21/2019    Procedure: UGI ENDO, INCLUDE ESOPHAGUS, STOMACH, & DUODENUM &/OR JEJUNUM; DX W/WO COLLECTION SPECIMN, BY BRUSH OR WASH;  Surgeon: Monte Fantasia, MD;  Location: GI PROCEDURES MEMORIAL [facility];  Service: Gastroenterology   ??? PR UPPER GI ENDOSCOPY,DIAGNOSIS N/A 12/13/2019    Procedure: UGI ENDO, INCLUDE ESOPHAGUS, STOMACH, & DUODENUM &/OR JEJUNUM; DX W/WO COLLECTION SPECIMN, BY BRUSH OR WASH;  Surgeon: Neysa Hotter, MD;  Location: GI PROCEDURES MEMORIAL [facility]  Service: Gastroenterology   ??? PR UPPER GI ENDOSCOPY,TUBE PLACE N/A 12/13/2019    Procedure: UGI ENDO; W/TRANSENDOSCOPIC TUBE/CATH PLCMT;  Surgeon: Neysa Hotter, MD;  Location: GI PROCEDURES MEMORIAL [facility];  Service: Gastroenterology       Allergies:  Bee pollen and Pollen extracts    All Medications:   Current Facility-Administered Medications   Medication Dose Route Frequency Provider Last Rate Last Admin   ??? albumin human 25 % bottle 25 g  25 g Intravenous BID David Stall, MD   25 g at 12/15/19 1519   ??? benzoyl peroxide 5 % gel   Topical Nightly David Stall, MD   1 application at 12/15/19 2017   ??? cholecalciferol (vitamin D3) tablet 5,000 Units  5,000 Units Oral Daily David Stall, MD   5,000 Units at 12/15/19 0804   ??? clindamycin (CLEOCIN  T) 1 % external solution   Topical Nightly David Stall, MD   1 application at 12/15/19 2017   ??? dextrose 50 % in water (D50W) 50 % solution 12.5 g  12.5 g Intravenous Q10 Min PRN David Stall, MD       ??? fluconazole (DIFLUCAN) tablet 100 mg  100 mg Oral Daily David Stall, MD   100 mg at 12/15/19 1610   ??? hydroxyzine (ATARAX) capsule/tablet 25 mg  25 mg Oral Q6H PRN Murvin Donning, MD       ??? insulin regular (HumuLIN,NovoLIN) injection 0-6 Units  0-6 Units Subcutaneous ACHS David Stall, MD       ??? magnesium oxide (MAG-OX) tablet 400 mg  400 mg Oral BID David Stall, MD   400 mg at 12/15/19 2017   ??? melatonin tablet 3 mg  3 mg Oral QPM Murvin Donning, MD   3 mg at 12/15/19 2017   ??? multivitamin, with zinc (AQUADEKS) chewable tablet  2 tablet Oral Daily David Stall, MD   2 tablet at 12/15/19 0804   ??? mycophenolate (MYFORTIC) EC tablet 540 mg  540 mg Oral BID David Stall, MD   540 mg at 12/15/19 2016   ??? ondansetron (ZOFRAN) injection 4 mg  4 mg Intravenous Q8H PRN David Stall, MD   4 mg at 12/12/19 0128   ??? pantoprazole (PROTONIX) EC tablet 40 mg  40 mg Oral BID Morrison Old, MD   40 mg at 12/15/19 2017   ??? potassium phosphate (monobasic) (K-PHOS) tablet 500 mg  500 mg Oral BID Murvin Donning, MD   500 mg at 12/15/19 2016   ??? predniSONE (DELTASONE) tablet 20 mg  20 mg Oral Daily David Stall, MD   20 mg at 12/15/19 0804   ??? rifAXIMin (XIFAXAN) tablet 550 mg  550 mg Oral BID David Stall, MD   550 mg at 12/15/19 2017   ??? spironolactone (ALDACTONE) tablet 50 mg  50 mg Oral Daily David Stall, MD   50 mg at 12/15/19 0804   ??? thiamine (B-1) tablet 200 mg  200 mg Oral TID David Stall, MD   200 mg at 12/15/19 2119   ??? valGANciclovir (VALCYTE) tablet 450 mg  450 mg Oral Q48H David Stall, MD   450 mg at 12/15/19 0804       Social History:     Social History     Socioeconomic History   ??? Marital status: Single     Spouse name: None   ??? Number of children: None   ??? Years of education: None   ??? Highest education level: None   Occupational History   ??? None   Social Needs   ??? Financial resource strain: None   ??? Food insecurity     Worry: Sometimes true     Inability: Sometimes true   ??? Transportation needs     Medical: None     Non-medical: None   Tobacco Use   ??? Smoking status: Never Smoker   ??? Smokeless tobacco: Never Used   ??? Tobacco comment: Step-father smokes outside   Substance and Sexual Activity   ??? Alcohol use: Never     Alcohol/week: 0.0 standard drinks     Frequency: Never     Comment: 1/2 glass of wine on rare occasions   ??? Drug use: Never   ??? Sexual activity: Never   Lifestyle   ???  Physical activity     Days per week: None     Minutes per session: None   ??? Stress: None   Relationships   ??? Social Wellsite geologist on phone: None     Gets together: None     Attends religious service: None     Active member of club or organization: None     Attends meetings of clubs or organizations: None     Relationship status: None   Other Topics Concern   ??? Do you use sunscreen? Not Asked   ??? Tanning bed use? No   ??? Are you easily burned? No   ??? Excessive sun exposure? No   ??? Blistering sunburns? No   Social History Narrative    Lives part time with mother and part time with [employer].          Family History:  The patient's family history includes Asthma in his brother; Diabetes in his maternal grandmother and paternal grandmother; Hypertension in his maternal grandfather and paternal grandfather..    Code Status:  Full Code      Objective: :  BP 118/64  - Pulse 83  - Temp 36.8 ??C (Oral)  - Resp 22  - Ht 170.2 cm (5' 7.01)  - Wt 59.9 kg (132 lb 0.9 oz)  - SpO2 100%  - BMI 20.68 kg/m??   Declines formal exam  Gen: NAD  Psych: appropriate for conversation  Pulm: non-labored breathing        Test Results    Lab Results   Component Value Date    POCGLU 117 09/24/2019    POCGLU 138 09/24/2019    POCGLU 165 09/23/2019    POCGLU 208 (H) 09/23/2019    POCGLU 183 (H) 09/23/2019     Lab Results   Component Value Date    A1C 4.6 (L) 10/24/2019    A1C 5.0 08/17/2019    A1C 4.9 10/13/2018     Lab Results   Component Value Date    CREATININE 2.38 (H) 12/15/2019    NA 134 (L) 12/15/2019    K 3.1 (L) 12/15/2019    CL 111 (H) 12/15/2019    CO2  12/15/2019      Comment:      Icteric specimen.    ANIONGAP  12/15/2019      Comment:      UNABLE TO CALCULATE      CALCIUM 8.3 (L) 12/15/2019    ALBUMIN 2.8 (L) 12/15/2019     Lab Results   Component Value Date    WBC 12.1 (H) 12/15/2019    HGB 8.6 (L) 12/15/2019    PLT 132 (L) 12/15/2019     Lab Results Component Value Date    AST 125 (H) 12/15/2019    ALT  12/15/2019      Comment:      Icteric specimen.    ALKPHOS 695 (H) 12/15/2019     Lab Results   Component Value Date    TSH 0.797 02/11/2018    FREET4 1.49 02/11/2018     Lab Results   Component Value Date    CHOL 159 09/30/2019    TRIG 79 09/30/2019    HDL  09/30/2019      Comment:      Specimen icteric.      LDL  09/30/2019      Comment:      NHLBI Recommended Ranges, LDL Cholesterol, for Adults (20+yrs) (ATPIII), mg/dL  Optimal              <  100  Near Optimal        100-129  Borderline High     130-159  High                160-189  Very High            >=190  NHLBI Recommended Ranges, LDL Cholesterol, for Children (2-19 yrs), mg/dL  Desirable            <161  Borderline High     110-129  High                 >=130

## 2019-12-16 NOTE — Unmapped (Signed)
Confidential Psychological Therapy Session  [facility] for Transplant Care    Patient Name: [name] Gross Gross  Medical Record Number: [id number]  Date of Service: December 15, 2019  Clinical Psychologist: Artemio Aly, PhD  Intern: None  Time Spent: 30 min of face-to-face counseling  CPT Procedure Code: 33295 (30 min psychotherapy with patient and/or family)  Therapy Type: Behavior Modifying/Cognitive Behavioral Therapy (CBT)  Purpose of Treatment: adjustment to chronic medical illness,??improve coping,??reduce depression symptoms.    Referral/Relevant History:  Mr.??Gross??is a very pleasant 19 y.o.??male??who presents for cognitive behavioral therapy to address adjustment to chronic medical illness (and recent hospitalization for liver transplant rejection). He was seen by the adult inpatient psychiatry team on 08/23/19 for a history of MDD (and two previous suicide attempts, last attempt April 2019) and current depressive symptoms. During assessment??with psychiatry, [name] Gross Gross declined pharmacotherapy but stated that he would be interested in receiving therapy services while inpatient to work through his difficult medical diagnoses and family situation.??He was seen by this provider for an initial visit on 08/29/19 and noted improved mood after reconnecting with social support, and requested continued psychology follow-up while inpatient. He was seen again by inpatient psychiatry on 09/01/19 and continued to decline pharmacotherapy, but later expressed interest in this and was prescribed venlafaxine prior to discharge from the hospital on 09/10/19??but was discontinued during a hospitalization on 09/18/19 due to potential AMS and contribution to a hand tremor.  ??  [name] Gross Gross was seen by this provider on 09/13/19 following discharge from the hospital for therapy, but then re-presented to the hospital on 09/15/19 for elevated creatinine prior to leaving AMA. He was then re-admitted to the hospital on 09/18/19 for a fever??and was seen again during his inpatient stay by this provider on 09/20/19. He has been seen for 3 outpatient appts with writer (last seen 10/24/19), but re-presented to the hospital on 12/11/19 for 2-week history of coffee ground emesis. He was found to have candidal esophagitis and oliguric AKI.     Review of Symptoms/ROS: Deferred    Subjective:   [name] Gross Gross reported that his mood has been annoyed recently, and that many little things have been annoying him. He noted that he is frustrated that many different things continue to go wrong, like this recent cough and N/V which resulted in him coming to the hospital again. He reports getting PTSD from all of the sounds in the hospital, and feels very on-edge while inpatient. He adamantly denied feeling very depressed or anxious, however, and noted that he is still trying to stay engaged and active (e.g. watching YouTube videos, spending time with his siblings).     With regards to his health, he provided several updates since last seen on 10/24/19. He described improvements in sleep at home as he was not using the bathroom as frequently, though he still experienced some insomnia. He also reported small improvements in pain because he stopped doing finger sticks altogether, which he suggests was approved by his doctors. However, he is still experiencing some back pain, and has not been able to be very physically active due to weakness and this new recurrent cough.     With regards to his social situation, he noted that he has been living full-time at his mom's house for the past several weeks, as he has been too ill to go back and forth between mom's and grandma's house. He reported that things are going okay with his mother, and that while she still tries too hard to  take care of him sometimes, they have not been getting in fights or arguments like before. He indicated that if it ever got to that point again, he would leave but go stay at his grandma's instead of being homeless again.     Prior to speaking with [name] Gross Gross, Clinical research associate tried several times to speak with him when he was unavailable. Spoke briefly with his grandmother, who noted that she believes he has been fighting more and less depressed than she has seen him in the past. She believes he would benefit from regular engagement in [facility] treatment, however, and was interested in helping him find a consistent MH provider near their home in [place], [place]. Writer also spoke with his mother [name], who indicated that [name] Gross Gross has been frustrated by not getting better.     Objective:     Mental Status Exam:  Appearance:??Malnourished and??jaundiced, and somewhat tired.  Motor:??No abnormal movements noted.  Speech/Language:??Quiet speech, but normal rate and rhythm??  Mood:??Annoyed  Affect:??Sad and somewhat flat, but he noted that he has always been quiet and private at baseline.  Thought Process:??Logical, linear, clear, coherent, goal directed  Thought Content:??Denies SI, HI, self harm, delusions, obsessions, paranoid ideation, or ideas of reference  Perceptual Disturbances:??Denies auditory and visual hallucinations, behavior not concerning for response to internal stimuli  Orientation:??Oriented to person, place, time, and general circumstances  Attention:??Able to fully attend without fluctuations in consciousness  Concentration:??Distractible, likely due to significant fatigue  Memory:??Immediate, short-term, long-term, and recall grossly intact  Fund of Knowledge:??Consistent with level of education and development  Insight:??Intact  Judgment:??Intact  Impulse Control:??Intact    Assessment:  Mr.  Gross participated modestly in this CBT session, as he appeared to be somewhat tired and quiet.  Similar to his last session on 10/24/19, [name] Gross Gross described his mood as annoyed, and noted that many small things will cause him to feel annoyed. It seems that overall, he is frustrated that his health continues to decline despite efforts to get stronger and healthier. He denied feeling depressed or anxious recently, though he continues to look depressed due to being quiet and somewhat difficult to engage; he notes that this may be his baseline and that he is a quiet and private person in general.    He appears to meet criteria for recurrent MDD, and??while he denies depressed mood at this time, his reports of feeling annoyed frequently may be a symptom of depression for him.??It is likely that continuing to promote ways to help [name] Gross Gross remain engaged and socially connected will be helpful. Continuing to promote??behavioral activation and goal setting??will also likely??be particularly useful, as feeling independent appears to be highly motivating. Additionally, exploring his values in life and finding ways to help him maintain some independence will likely be very beneficial. Improvements??in depression and anxiety symptoms can contribute to improved quality of life, functionality, and ability to cope with chronic illness.   ??  Focus on current treatment is??reduction of depressive symptoms, increasing behavioral activation,??learning cognitive restructuring strategies,??adjustment to chronic medical conditions.   ??  Focus on future sessions will include??values exploration, behavioral activation, learning cognitive coping strategies, relaxation strategies.  ??  Adherence concerns:??[name] Gross Gross left AMA after presenting to the hospital on 09/15/19, despite speaking with his TNC Jamey Gross. He reported that he understands why leaving AMA is concerning to the transplant team, but felt overwhelmed after being in excruciating pain and felt like he needed to leave. He also previously reported that he had not been checking  his blood sugar or taking insulin for about four days due to finger pain; today, he noted that he has stopped checking his blood sugar altogether (though he indicated that this was supported by his doctors).   ??  [name] Gross Gross appears to be struggling with the balance between wanting independence and needing medical assistance, but expressed a willingness to adhere to medical recommendations. He also appears to be focusing on short-term outcomes and avoiding short-term discomfort, like not wanting to attend appointments if it is cold outside, despite the potential long-term impact this may have on the team's perception of his adherence. He appears to be doing well with attendance to outpatient appts and taking medication recently.??  ??  Diagnostic Impression:??Major Depressive Disorder, Recurrent Episode, Moderate Severity, in early remission    Risk Assessment:  A suicide and violence risk assessment was performed as part of this evaluation. The patient is deemed to be at chronic elevated risk for self-harm/suicide given the following factors: male age 50-35, current diagnosis of depression, previous acts of self-harm and chronic severe medical condition. The patient is deemed to be at chronic elevated risk for violence given the following factors: male gender and younger age. These risk factors are mitigated by the following factors:lack of active SI/HI, no know access to weapons or firearms, motivation for treatment, supportive family, enjoyment of leisure actvities, expresses purpose for living, current treatment compliance, safe housing and support system in agreement with treatment recommendations. There is no acute risk for suicide or violence at this time. The patient was educated about relevant modifiable risk factors including following recommendations for treatment of psychiatric illness and abstaining from substance abuse.    While future psychiatric events cannot be accurately predicted, the patient does not currently require  acute inpatient psychiatric care and does not currently meet [facility] involuntary commitment criteria.    Psychometric Testing: None administered today. Previous scores on the PHQ-9 and GAD-7 are shown below:    PHQ-9 (depression symptoms):  ??  09/13/19: 3 (minimal)  10/03/19: 0 (none)  ??  GAD-7 (anxiety symptoms):  ??  09/13/19: 2 (minimal)  10/03/19: 4 (minimal)    Plan:  Mr.  Gross will continue meeting with me for CBT, and was given instructions for therapeutic homework, including to practice meditation and deep breathing while in the hospital to reduce acute anxiety associated with being in the hospital.     Mr.  Gross will be followed approximately once/week while inpatient. Should he be discharged before being seen again, he will be contacted to set up a telehealth follow-up or an in-person appt that coincides with his other appts.      Mr.  Gross was given this writer's contact information with confidential voice mail number and instructed to call [phone] for emergencies.

## 2019-12-17 LAB — AMMONIA: Ammonia:SCnc:Pt:Plas:Qn:: 11

## 2019-12-17 LAB — COMPREHENSIVE METABOLIC PANEL
ALBUMIN: 2.9 g/dL — ABNORMAL LOW (ref 3.5–5.0)
ALKALINE PHOSPHATASE: 667 U/L — ABNORMAL HIGH (ref 65–260)
AST (SGOT): 79 U/L — ABNORMAL HIGH (ref 19–55)
BILIRUBIN TOTAL: 36.3 mg/dL — ABNORMAL HIGH (ref 0.0–1.2)
BLOOD UREA NITROGEN: 44 mg/dL — ABNORMAL HIGH (ref 7–21)
BUN / CREAT RATIO: 25
CALCIUM: 9.3 mg/dL (ref 8.5–10.2)
CHLORIDE: 112 mmol/L — ABNORMAL HIGH (ref 98–107)
CREATININE: 1.78 mg/dL — ABNORMAL HIGH (ref 0.70–1.30)
EGFR CKD-EPI AA MALE: 63 mL/min/{1.73_m2} (ref >=60)
EGFR CKD-EPI NON-AA MALE: 54 mL/min/{1.73_m2} — ABNORMAL LOW (ref >=60)
GLUCOSE RANDOM: 200 mg/dL — ABNORMAL HIGH (ref 70–179)
SODIUM: 139 mmol/L (ref 135–145)

## 2019-12-17 LAB — INR: Coagulation tissue factor induced.INR:RelTime:Pt:PPP:Qn:Coag: 1.68

## 2019-12-17 LAB — MANUAL DIFFERENTIAL
BASOPHILS - REL (DIFF): 0 %
EOSINOPHILS - ABS (DIFF): 0.2 10*9/L (ref 0.0–0.4)
EOSINOPHILS - REL (DIFF): 2 %
LYMPHOCYTES - ABS (DIFF): 0.5 10*9/L — ABNORMAL LOW (ref 1.5–5.0)
LYMPHOCYTES - REL (DIFF): 4 %
MONOCYTES - ABS (DIFF): 0.6 10*9/L (ref 0.2–0.8)
MONOCYTES - REL (DIFF): 5 %
NEUTROPHILS - ABS (DIFF): 10.9 10*9/L — ABNORMAL HIGH (ref 2.0–7.5)
NEUTROPHILS - REL (DIFF): 89 %

## 2019-12-17 LAB — CBC W/ AUTO DIFF
HEMATOCRIT: 24.5 % — ABNORMAL LOW (ref 41.0–53.0)
HEMOGLOBIN: 7.8 g/dL — ABNORMAL LOW (ref 13.5–17.5)
MEAN CORPUSCULAR HEMOGLOBIN CONC: 32 g/dL (ref 31.0–37.0)
MEAN PLATELET VOLUME: 6.9 fL — ABNORMAL LOW (ref 7.0–10.0)
NUCLEATED RED BLOOD CELLS: 11 /100{WBCs} — ABNORMAL HIGH (ref <=4)
PLATELET COUNT: 135 10*9/L — ABNORMAL LOW (ref 150–440)
RED BLOOD CELL COUNT: 2.74 10*12/L — ABNORMAL LOW (ref 4.50–5.90)
WBC ADJUSTED: 12.3 10*9/L — ABNORMAL HIGH (ref 4.5–11.0)

## 2019-12-17 LAB — MAGNESIUM: Magnesium:MCnc:Pt:Ser/Plas:Qn:: 1.9

## 2019-12-17 LAB — ALT (SGPT): Alanine aminotransferase:CCnc:Pt:Ser/Plas:Qn:: 0

## 2019-12-17 LAB — MEAN CORPUSCULAR HEMOGLOBIN: Erythrocyte mean corpuscular hemoglobin:EntMass:Pt:RBC:Qn:Automated count: 28.6

## 2019-12-17 LAB — PHOSPHORUS: Phosphate:MCnc:Pt:Ser/Plas:Qn:: 3.1

## 2019-12-17 LAB — CALCIUM IONIZED VENOUS (MG/DL): Calcium.ionized:MCnc:Pt:Bld:Qn:: 5.26

## 2019-12-17 LAB — SPHEROCYTES

## 2019-12-17 NOTE — Unmapped (Signed)
Pediatric Palliative Care Consult Note    [name] Gross is a 19 y.o. male seen for pediatric palliative care consultation at the request of Dr. Andi [name] Gross for family support.    Primary Care Provider: [name], MD    History provided by: [name] Gross and Mother-[name] Gross    Assessment and Summary of Discussion:   [name] Gross is a 19 y.o. 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  [name] Gross is followed by [facility] hepatology teams as an outpatient and was being worked up for possible repeat liver transplant; now on hold due to acute illness, overall decline in health, and need for hospitalization.  Palliative care consult requested for family support.    The supportive care team met with [name] Gross and his mother, [name] Gross, at bedside today after learning from other members of the care team that [name] Gross and [name] Gross had gotten into an argument regarding [name] Gross refusing care; lab draws, blood glucose checks, medications, vital sign monitoring.  [name] Gross had also made concerning statements including wanting to stop all care, discharge home, and be done.     [name] Gross endorsed feeling as if he had hit a wall.  He stated dealing with his illness and all of the interventions and treatments that come along with it was too much.  It seemed there was always something to do, and some days, he just wanted to be left alone.  He felt when one thing gets better, something else goes wrong.  He described his course as being taxing and him feeling overexerted.    [name] Gross was asked to clarify his goals because our team did not want to make any assumptions.  He was asked if his goals had truly changed and he wanted to focus on comfort, or if he was making statements about being done out of frustration and exhaustion.  [name] Gross responded by saying it was likely a mixture of both.    The supportive care team reminded [name] Gross that conversations regarding goals of care and advance care plaining were fluid and could be revisited at any time.  Our team communicated our ongoing support of him and acknowledged his autonomy and right to self-determination.  We offered to meet with him when he was feeling better if he did desire to revisit his goals and priorities.    We recommend having a family meeting next week with primary team and supportive care team if [name] Gross continues to refuse care over the weekend.      Recommendations:   1. SYMPTOMS:   - Pain/discomfort: [name] Gross continues to report throat pain r/t NG tube placement, likely worsened by candidal esophagitis.  ?? Recommend changing PO meds to IV if possible and/or giving meds via NG tube to reduce further irritation from swallowing.  ??  May also consider throat spray PRN for pain.  - FEN/GI: S/p EGD on on 2/2 w/ candidal esophagitis, no varices - treating with fluconazole (100 mg daily) and pantoprazole (40 mg BID).    - Given [name] Gross' overall poor nutritional status; weight loss, decreased PO intake, NG feeds were initiated.  Denies nausea.  Peritoneal cultures on 1/31 NGTD.  ?? Tolerating NG feeds at goal rate.  ?? Able to have regular diet w/ calorie count as tolerated.  ?? Agree with Zofran PRN for nausea.  -  Mood: H/o major depressive disorder.  [name] Gross was willing to meet with our team, but he continues to appear very withdrawn and has expressed wanting to be left alone to multiple members of his care team.  Today, he has refused much of his care and has argued with his mother over her displeasure for him doing so.   ?? Appreciate involvement of psychiatry.  ?? Appreciate involvement of CLS.   - Sleep disturbance: Difficulty sleeping reported; mostly attributed to interruptions at night.  [name] Gross reported he tries to take a nap(s) during the day to make up for lost sleep at night.  ?? Recommend clustering care to allow  for periods of uninterrupted sleep when able to do so safely.  ?? Agree with melatonin, 3 mg every evening to promote sleep.  ?? Agree with hydroxyzine, 25 mg q6h PRN anxiety/sleep.     2. GOALS:   - [name] Gross prefers consistency with information and care when possible.  - Mother is concerned about [name] Gross' mental health and appreciates psychiatry's continued involvement.  - [name] Gross has long term goals which include going back to school and becoming a Runner, broadcasting/film/video or nurse.    3. DECISIONS:   - Not facing immediate medical decisions at this time but will face difficult decisions regarding future care, including repeat transplant if he is a candidate.    4. ADVANCE CARE PLANNING  - Code status: Full code.  - Prognosis: Guarded given severity of liver disease and potential for lack of response to salvage therapies, as well as AKI, and complex social situation.    5. SUPPORT:   - [name] Gross has expanded his support network by reconnecting with many family members who have expressed their desire to support him and assist with his care.  He is currently living with his mother who is present with him during this hospitalization and is active in his care.  She is working to balance her role as mother with [name] Gross' right to autonomy and the right to direct his own care.  - Discussed hospital based resources.    6. CARE COORDINATION:   - Care coordinated with peds GI team, nutritionist, and nursing.  - Supportive care team available for team / family meetings as needed.    Total time spent with patient for evaluation & management (excluding ACP documented separately): 30 minutes.  Greater than 50% of this time spent on counseling/coordination of care: Yes.  See ACP Note from today for additional billable service: No.      We appreciate the consult. The Children's Supportive Care Team can be reached by pager [phone] [name]) or email [email or web address]).       History of Present Illness:   [name] Gross is a 19 y.o. 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  Etiology of coffee ground emesis thought to be secondary to esophageal varices in context of longstanding portal hypertension.  [name] Gross was being evaluated for repeat liver transplant prior to this hospitalization.     Current hospital course significant for hypotension requiring stress dose steroids, IV fluids and albumin. He is on broad spectrum antibiotics in context of GI bleed and risk of SBP. Pediatric and adult hepatology teams following. Blood Cx on 1/30 is NGTD. Peritoneal cultures on 1/31 pending.  EGD rescheduled for 2/2.    Allergies:  Bee pollen and Pollen extracts    Medications:  Scheduled Meds:  ??? benzoyl peroxide   Topical Nightly   ??? calcium carbonate  600 mg of elem calcium Oral TID   ??? cholecalciferol (vitamin D3)  5,000 Units Oral Daily   ??? clindamycin   Topical Nightly   ??? fluconazole  100 mg Oral Daily   ??? insulin regular  0-6 Units Subcutaneous ACHS   ??? magnesium oxide  400 mg Oral BID   ??? melatonin  3 mg Oral QPM   ??? multivitamin, with zinc  2 tablet Oral Daily   ??? mycophenolate  540 mg Oral BID   ??? pantoprazole  40 mg Oral BID   ??? potassium phosphate (monobasic)  1,000 mg Oral 4x Daily   ??? predniSONE  20 mg Oral Daily   ??? rifAXIMin  550 mg Oral BID   ??? spironolactone  50 mg Oral Daily   ??? thiamine  200 mg Oral TID   ??? valGANciclovir  450 mg Oral Q48H     Continuous Infusions:    PRN Meds:.dextrose 50 % in water (D50W), hydroxyzine, ondansetron        Physical Exam:   Temp:  [36.8 ??C (98.2 ??F)-37 ??C (98.6 ??F)] 37 ??C (98.6 ??F)  Heart Rate: [83-99] 84  SpO2 Pulse:  [88] 88  Resp:  [18-22] 18  SpO2:  [99 %-100 %] 100 %  BP: (115-128)/(63-82) 128/82  MAP (mmHg):  [78-96] 96     Gen: thin adolescent male, lying in bed, NAD, occasionally interacts w Derrek Monaco, polite.  HEENT: sclera icteric, [place]/AT.  Resp: normal WOB on RA  ABD: distended, slightly firm.  Extrem: BLE edema, well-perfused.  Neuro: awake/alert, oriented X 3, appropriate, clear speech, +tremors of hands/UE's    Data: Reviewed in Epic.    Jennette Dubin, [place], CPNP  Children[facility] Supportive Care Team  [phone] (pager)

## 2019-12-17 NOTE — Unmapped (Signed)
[name] Gross remains afebrile and VSS. Pt refused phlebotomy draw, accu checks, continuous NGT feeds stopped @ 0145 per pt request and pt did not take calcium or mag overnight. Continues to have generalized weakness, tremors, pitting edema to BLE, and labored breathing.  Completed albumin doses. Mother @ bedside assisting with care. Will monitor closely and follow plan of care.      Problem: Adult Inpatient Plan of Care  Goal: Plan of Care Review  Outcome: Progressing  Goal: Patient-Specific Goal (Individualization)  Outcome: Progressing  Goal: Absence of Hospital-Acquired Illness or Injury  Outcome: Progressing  Goal: Optimal Comfort and Wellbeing  Outcome: Progressing  Goal: Readiness for Transition of Care  Outcome: Progressing  Goal: Rounds/Family Conference  Outcome: Progressing

## 2019-12-17 NOTE — Unmapped (Addendum)
Zayvon had a rough day. Pt with increased WOB this shift, and non productive cough. Pt remains on RA. Pt is slightly tachycardic to 90/low 100s. Remains afebrile. No report of pain this shift. Pt continues to be drowsy with tremors. Pt has generalized edema and +2 pitting edema in lower extremities. Still some swelling in R arm. Pt had PVLs today that were normal. Pt abdomen with gross ascites-- firm and distended with some tenderness mainly on R side. Pt weight up again this morning, team aware. NGT remains in place, placement verified w CXR. Tolerating feeds at goal rate. Pt refused all afternoon meds today, took a few sips of potassium replacement. Did not take some of his morning meds which were disposed of after hours of refusing to continue to take pills. Pt also refusing all lab and glucose sticks. Team aware of critical potassium level.  Pt had been refusing pulse ox and CRM, but decided to let us try around 1630, will keep on as long as possible.     Pt had an event this afternoon around 1400 when phlebotomy came to draw labs this afternoon. During this time he started refusing all medications and stated that he wanted to withdraw all care, asking for discharge paperwork and stating that he was done with all of this. This caused him and his mom to get into a verbal fight after which mom left the room upset. After this event pt continued to refuse most care that was not IV related. Continuing to take IV meds such as albumin. He is currently saying that he will let us administer PO meds through his NG tube. Palliative care teams and primary team came to bedside multiple times to try to cope and come up with plan.     I continued to express concerns while pt was refusing replacements and other meds and that the pt did not agree to be on montiors for a majority of the day. Just hard from a nursing standpoint to assess stability given his labs and clinical presentation. Will continue to monitor.     Pt also refusing to get up to pee. As of 1740, only two urine occurrences for today with one unmeasured. Refused bladder scan. No BM.   No falls or injuries, mom at bedside.   Problem: Adult Inpatient Plan of Care  Goal: Plan of Care Review  Outcome: Progressing  Goal: Patient-Specific Goal (Individualization)  Outcome: Progressing  Goal: Absence of Hospital-Acquired Illness or Injury  Outcome: Progressing  Goal: Optimal Comfort and Wellbeing  Outcome: Progressing  Goal: Readiness for Transition of Care  Outcome: Progressing  Goal: Rounds/Family Conference  Outcome: Progressing     Problem: Skin Injury Risk Increased  Goal: Skin Health and Integrity  Outcome: Progressing     Problem: Self-Care Deficit  Goal: Improved Ability to Complete Activities of Daily Living  Outcome: Progressing     Problem: Fall Injury Risk  Goal: Absence of Fall and Fall-Related Injury  Outcome: Progressing

## 2019-12-17 NOTE — Unmapped (Signed)
Endocrinology Inpatient Consult Note    Requesting Attending Physician :  Morrison Old, MD  Service Requesting Consult : Ped Gastroenterology [facility])  Primary Care Provider: [name], MD  Outpatient Endocrinologist: N/A    Assessment/Recommendations:  [name] Gross is a 19 y.o. male with h/o liver transplant admitted for AKI (acute kidney injury) (CMS-HCC). Endocrinology have been consulted to evaluate patient for hyperglycemia.    1) Steroid induced hyperglycemia: No BG data available as patient has declined POC. It would be reasonable to check off IV line. Either way, no new recommendations given the absence of data.  -POC BG monitor Q6 hours on TF  -recommend starting regular insulin sensitive correctional scale Q6hrs while on TF  -If he reconstitutes a diet, will consider lispro with oral feeding.  -goal BG is 140-180 while inpatient.    The patient was seen and discussed with attending, Dr. Naoma Diener.    Thank you for the consult. . Please call/page [phone] with questions or concerns.     --  Glean Hess, MD  Endocrinology Fellow [facility] at [facility]  Phone: [phone]   Fax: [phone]        History of Present Illness: :    Reason for Consult: Hyperglycemia  Interval History:  Declining all lab testing and exams. Discussed with patient and mom this morning. Patient was reluctant to speak to me.    Initial History  [name] Gross is a 19 y.o. male with h/o liver transplant admitted for AKI (acute kidney injury) (CMS-HCC). Endocrinology have been consulted to evaluate patient for hyperglycemia.    Patient is a 19 yo with a complicated medical history with current admission to [facility] for acute renal failure. He presented to an outside hospital [facility]) with several weeks of decreased oral intake and coffee ground emesis. He was found to be oliguric with a creatinine of 2.7 concerning for prerenal renal injury vs hepatorenal syndrome. 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 He is s/p stress dosed steroids in PICU. Currently on 15mg  prednisone. He will be started on TF based on review of pediatrics notes.     He has been NPO since admission.     Review of Systems:  A 12 system review of systems was negative except as noted in HPI.    Past Medical History:    Medical History:  Past Medical History:   Diagnosis Date   ??? 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 Depression    ??? MSSA bacteremia 08/27/2019   ??? Seasonal allergies    ??? Sickle cell trait (CMS-HCC)    ??? Strep Mitis Central line-associated bloodstream infection 08/27/2019       Surgical History:  Past Surgical History:   Procedure Laterality Date   ??? FRENULECTOMY, LINGUAL     ??? PR ERCP REMOVE FOREIGN BODY/STENT BILIARY/PANC DUCT N/A 08/20/2017    Procedure: ENDOSCOPIC RETROGRADE CHOLANGIOPANCREATOGRAPHY (ERCP); W/ REMOVAL OF FOREIGN BODY/STENT FROM BILIARY/PANCREATIC DUCT(S);  Surgeon: Vonda Antigua, MD;  Location: GI PROCEDURES MEMORIAL [facility];  Service: Gastroenterology   ??? PR ERCP REMOVE FOREIGN BODY/STENT BILIARY/PANC DUCT N/A 06/04/2018    Procedure: ENDOSCOPIC RETROGRADE CHOLANGIOPANCREATOGRAPHY (ERCP); W/ REMOVAL OF FOREIGN BODY/STENT FROM BILIARY/PANCREATIC DUCT(S);  Surgeon: Chriss Driver, MD;  Location: GI PROCEDURES MEMORIAL  [facility];  Service: Gastroenterology   ??? PR ERCP STENT PLACEMENT BILIARY/PANCREATIC DUCT N/A 11/21/2016    Procedure: ENDOSCOPIC RETROGRADE CHOLANGIOPANCREATOGRAPHY (ERCP); WITH PLACEMENT OF ENDOSCOPIC STENT INTO BILIARY OR PANCREATIC DUCT;  Surgeon: Vonda Antigua, MD;  Location: GI PROCEDURES MEMORIAL [facility];  Service: Gastroenterology   ??? PR ERCP,W/REMOVAL STONE,BIL/PANCR DUCTS N/A 08/20/2017    Procedure: ERCP; W/ENDOSCOPIC RETROGRADE REMOVAL OF CALCULUS/CALCULI FROM BILIARY &/OR PANCREATIC DUCTS;  Surgeon: Vonda Antigua, MD;  Location: GI PROCEDURES MEMORIAL [facility];  Service: Gastroenterology   ??? PR ERCP,W/REMOVAL STONE,BIL/PANCR DUCTS N/A 06/04/2018    Procedure: ERCP; W/ENDOSCOPIC RETROGRADE REMOVAL OF CALCULUS/CALCULI FROM BILIARY &/OR PANCREATIC DUCTS;  Surgeon: Chriss Driver, MD;  Location: GI PROCEDURES MEMORIAL [facility];  Service: Gastroenterology   ??? PR TRANSPLANT LIVER,ALLOTRANSPLANT N/A 09/22/2016    Procedure: LIVER ALLOTRANSPLANTATION; ORTHOTOPIC, PARTIAL OR WHOLE, FROM CADAVER OR LIVING DONOR, ANY AGE;  Surgeon: Doyce Loose, MD;  Location: MAIN OR [facility];  Service: Transplant   ??? PR TRANSPLANT,PREP DONOR LIVER, WHOLE N/A 09/22/2016    Procedure: [facility] STD PREP CAD DONOR WHOLE LIVER GFT PRIOR TNSPLNT,INC CHOLE,DISS/REM SURR TISSU WO TRISEG/LOBE SPLT;  Surgeon: Doyce Loose, MD;  Location: MAIN OR [facility];  Service: Transplant   ??? PR UPGI ENDOSCOPY W/US FN BX N/A 08/20/2017    Procedure: UGI W/ TRANSENDOSCOPIC ULTRASOUND GUIDED INTRAMURAL/TRANSMURAL FINE NEEDLE ASPIRATION/BIOPSY(S), ESOPHAGUS;  Surgeon: Vonda Antigua, MD;  Location: GI PROCEDURES MEMORIAL [facility];  Service: Gastroenterology   ??? PR UPPER GI ENDOSCOPY,BIOPSY N/A 07/15/2016    Procedure: UGI ENDOSCOPY; WITH BIOPSY, SINGLE OR MULTIPLE;  Surgeon: Arnold Long Mir, MD;  Location: PEDS PROCEDURE ROOM [facility];  Service: Gastroenterology   ??? PR UPPER GI ENDOSCOPY,CTRL BLEED Left 05/05/2016    Procedure: UGI ENDOSCOPY; WITH CONTROL OF BLEEDING, ANY METHOD;  Surgeon: Arnold Long Mir, MD;  Location: PEDS PROCEDURE ROOM [facility];  Service: Gastroenterology   ??? PR UPPER GI ENDOSCOPY,CTRL BLEED N/A 05/12/2016    Procedure: UGI ENDOSCOPY; WITH CONTROL OF BLEEDING, ANY METHOD;  Surgeon: Annie Paras, MD;  Location: CHILDRENS OR [facility];  Service: Gastroenterology   ??? PR UPPER GI ENDOSCOPY,DIAGNOSIS N/A 09/21/2019    Procedure: UGI ENDO, INCLUDE ESOPHAGUS, STOMACH, & DUODENUM &/OR JEJUNUM; DX W/WO COLLECTION SPECIMN, BY BRUSH OR WASH;  Surgeon: Monte Fantasia, MD;  Location: GI PROCEDURES MEMORIAL [facility];  Service: Gastroenterology   ??? PR UPPER GI ENDOSCOPY,DIAGNOSIS N/A 12/13/2019    Procedure: UGI ENDO, INCLUDE ESOPHAGUS, STOMACH, & DUODENUM &/OR JEJUNUM; DX W/WO COLLECTION SPECIMN, BY BRUSH OR WASH;  Surgeon: Neysa Hotter, MD;  Location: GI PROCEDURES MEMORIAL [facility];  Service: Gastroenterology   ??? PR UPPER GI ENDOSCOPY,TUBE PLACE N/A 12/13/2019    Procedure: UGI ENDO; W/TRANSENDOSCOPIC TUBE/CATH PLCMT;  Surgeon: Neysa Hotter, MD;  Location: GI PROCEDURES MEMORIAL [facility];  Service: Gastroenterology       Allergies:  Bee pollen and Pollen extracts    All Medications:   Current Facility-Administered Medications   Medication Dose Route Frequency Provider Last Rate Last Admin   ??? benzoyl peroxide 5 % gel   Topical Nightly David Stall, MD   Given at 12/16/19 2116   ??? calcium carbonate (OS-CAL) tablet 600 mg of elem calcium  600 mg of elem calcium Enteral tube: gastric  TID Janyth Contes, MD   600 mg of elem calcium at 12/16/19 2114   ??? cholecalciferol (vitamin D3) tablet 5,000 Units  5,000 Units Enteral tube: gastric  Daily Janyth Contes, MD       ???  clindamycin (CLEOCIN T) 1 % external solution   Topical Nightly David Stall, MD   Given at 12/16/19 2116   ??? dextrose 50 % in water (D50W) 50 % solution 12.5 g  12.5 g Intravenous Q10 Min PRN David Stall, MD       ??? fluconazole (DIFLUCAN) tablet 100 mg  100 mg Enteral tube: gastric  Daily Janyth Contes, MD       ??? hydroxyzine (ATARAX) capsule/tablet 25 mg  25 mg Enteral tube: gastric  Q6H PRN Janyth Contes, MD   25 mg at 12/16/19 2249   ??? insulin regular (HumuLIN,NovoLIN) injection 0-6 Units  0-6 Units Subcutaneous ACHS David Stall, MD   1 Units at 12/16/19 1610   ??? magnesium oxide (MAG-OX) tablet 400 mg  400 mg Enteral tube: gastric  BID Janyth Contes, MD   400 mg at 12/16/19 2115   ??? melatonin tablet 3 mg  3 mg Enteral tube: gastric  QPM Janyth Contes, MD   3 mg at 12/16/19 2115   ??? multivitamin, with zinc (AQUADEKS) chewable tablet  2 tablet Enteral tube: gastric  Daily Janyth Contes, MD       ??? mycophenolate (MYFORTIC) EC tablet 540 mg  540 mg Oral BID David Stall, MD   540 mg at 12/16/19 2100   ??? ondansetron (ZOFRAN) injection 4 mg  4 mg Intravenous Q8H PRN David Stall, MD   4 mg at 12/12/19 0128   ??? pantoprazole (PROTONIX) oral suspension  40 mg Enteral tube: gastric  BID Janyth Contes, MD   40 mg at 12/16/19 2115   ??? potassium phosphate (monobasic) (K-PHOS) tablet 1,000 mg  1,000 mg Oral 4x Daily David Stall, MD   1,000 mg at 12/16/19 2115   ??? predniSONE (DELTASONE) tablet 20 mg  20 mg Enteral tube: gastric  Daily Janyth Contes, MD       ??? rifaximin Burman Blacksmith) oral suspension  550 mg Enteral tube: gastric  BID Janyth Contes, MD   550 mg at 12/16/19 2100   ??? spironolactone (ALDACTONE) tablet 50 mg  50 mg Enteral tube: gastric  Daily Janyth Contes, MD       ??? thiamine (B-1) tablet 200 mg  200 mg Enteral tube: gastric  TID Janyth Contes, MD   200 mg at 12/16/19 2100   ??? valGANciclovir (VALCYTE) tablet 450 mg  450 mg Enteral tube: gastric  Q48H Janyth Contes, MD           Social History:     Social History     Socioeconomic History   ??? Marital status: Single     Spouse name: None   ??? Number of children: None   ??? Years of education: None   ??? Highest education level: None   Occupational History   ??? None   Social Needs   ??? Financial resource strain: None   ??? Food insecurity     Worry: Sometimes true     Inability: Sometimes true   ??? Transportation needs     Medical: None     Non-medical: None   Tobacco Use   ??? Smoking status: Never Smoker   ??? Smokeless tobacco: Never Used   ??? Tobacco comment: Step-father smokes outside Substance and Sexual Activity   ??? Alcohol use: Never     Alcohol/week: 0.0 standard drinks     Frequency: Never     Comment: 1/2 glass of wine on rare occasions   ??? Drug use: Never   ???  Sexual activity: Never   Lifestyle   ??? Physical activity     Days per week: None     Minutes per session: None   ??? Stress: None   Relationships   ??? Social Wellsite geologist on phone: None     Gets together: None     Attends religious service: None     Active member of club or organization: None     Attends meetings of clubs or organizations: None     Relationship status: None   Other Topics Concern   ??? Do you use sunscreen? Not Asked   ??? Tanning bed use? No   ??? Are you easily burned? No   ??? Excessive sun exposure? No   ??? Blistering sunburns? No   Social History Narrative    Lives part time with mother and part time with [employer].          Family History:  The patient's family history includes Asthma in his brother; Diabetes in his maternal grandmother and paternal grandmother; Hypertension in his maternal grandfather and paternal grandfather..    Code Status:  Full Code      Objective: :  BP 128/82  - Pulse 116  - Temp 37.1 ??C (Oral)  - Resp 27  - Ht 170.2 cm (5' 7.01)  - Wt 60.2 kg (132 lb 11.5 oz)  - SpO2 98%  - BMI 20.78 kg/m??   Declines formal exam  Gen: NAD  Psych: appropriate for conversation  Pulm: non-labored breathing        Test Results    Lab Results   Component Value Date    POCGLU 175 12/16/2019    POCGLU 117 09/24/2019    POCGLU 138 09/24/2019    POCGLU 165 09/23/2019    POCGLU 208 (H) 09/23/2019     Lab Results   Component Value Date    A1C 4.6 (L) 10/24/2019    A1C 5.0 08/17/2019    A1C 4.9 10/13/2018     Lab Results   Component Value Date    CREATININE 2.27 (H) 12/16/2019    NA 138 12/16/2019    K 2.8 (L) 12/16/2019    CL 110 (H) 12/16/2019    CO2  12/16/2019      Comment:      Icteric specimen.      ANIONGAP  12/16/2019      Comment:      Unable to calculate      CALCIUM 8.8 12/16/2019    ALBUMIN 2.8 (L) 12/16/2019 Lab Results   Component Value Date    WBC 13.4 (H) 12/16/2019    HGB 8.5 (L) 12/16/2019    PLT 113 (L) 12/16/2019     Lab Results   Component Value Date    AST 94 (H) 12/16/2019    ALT  12/16/2019      Comment:      Icteric specimen.      ALKPHOS 650 (H) 12/16/2019     Lab Results   Component Value Date    TSH 0.797 02/11/2018    FREET4 1.49 02/11/2018     Lab Results   Component Value Date    CHOL 159 09/30/2019    TRIG 79 09/30/2019    HDL  09/30/2019      Comment:      Specimen icteric.      LDL  09/30/2019      Comment:      NHLBI Recommended Ranges, LDL Cholesterol,  for Adults (20+yrs) (ATPIII), mg/dL  Optimal              <161  Near Optimal        100-129  Borderline High     130-159  High                160-189  Very High            >=190  NHLBI Recommended Ranges, LDL Cholesterol, for Children (2-19 yrs), mg/dL  Desirable            <096  Borderline High     110-129  High                 >=130

## 2019-12-17 NOTE — Unmapped (Signed)
Pediatric Daily Progress Note     Assessment/Plan:     Principal Problem:    AKI (acute kidney injury) (CMS-HCC)  Active Problems:    History of liver transplant (CMS-HCC)    Major depressive disorder    Liver transplant rejection (CMS-HCC)  Resolved Problems:    * No resolved hospital problems. *    [name] Gross is a 19 y.o. 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 He is currently in stable condition however requires close monitoring in setting of poor compliance with current medical plan. Given the patient's ongoing chronic malnutrition and poor PO intake he requires continuous NG tube feeds. He has refused PO medications and most have been adjusted to be administered via NG tube. His refusal is due to his frustration with his NG-tube placement for nutritional purposes. Working with Rebekah to create plan of care goals to ensure that he can be adequately treated for his medical conditions while admitted. He also refuses POC BG checks while trying to manage hyperglycemia. Patient continues to show signs of volume overload, including marked pedal edema, weight gain of 15lb since admission, and increasing bilateral pleural effusions on imaging. Continuing to support reduction of edema with albumin doses and increase diuresis. Lastly, his creatinine continues to improve currently at 1.78 today. We will continue to monitor for a post-AKI diuresis and consider fluids per nephro recs if needed.       Acute Kidney Injury: Likely due to volume depletion in the setting of coffee ground emesis and tacrolimus use. Urine microscopy showing muddy brown casts c/w ATN.   - Peds nephrology following, appreciate recs.    - Per peds nephro, will consider restarting fluids if pt reaches net negative >500-700 mL. Currently net +1680   - Daily weights  - Renal U/S completed 2/3 - No hydronephrosis, renal parenchyma similar to prior  - Creatinine trend: 2/4 - 2.38, 2/5 - 2.27, 2/6 1.78 (prior to admission, creatinine 1.07-1.78)  - Medications are renally-dosed     FEN/GI  - Strict I/O  - Continuous feeds at 2ml/hr              - Flush min of 30 ml q6hr to maintain patency of tube   - Regular diet with calorie count  - Albumin 25mg  BID-> 1 dose 2/6   - Albumin goal >3  - K Phos PO to 1000mg  qid   - K at 2.9 on 2/6  - Calcium carbonate 600mg  TID for bone health   - Vitamin D3 5000mg  qd   - IV Zofran 4mg  q8h PRN  - IV Protonix 40mg  BID  - Continue home aquadeks, mag-ox, vit D3     Candidal Esophagitis  - Fluconazole 100mg  every day (loading dose 400mg  given 2/2) for 13 days   - S/p vancomycin, cefepime and flagyl due to initial concern for SBP which is now unlikely               - NGTD on peritoneal cultures (1/31)    ??  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   - Fibrinogen low at 91. No interventions required at this time per adult hepatology. Will continue to defer to adult hepatology recs.   - CBC daily   - SCD's in place   - Will check INR every day    - INR 1.68 on 2/6     Psych:  - Psychiatry following, appreciate recs. Per Psych, patient's presentation of mood symptoms is consistent with MDD. Pt interested in starting Lexapro, but will hold until more medically stable.   - CBT with Psychology 1x/week  - Hydroxyzine 25mg  PO q6h PRN for anxiety and sleep - Baseline EKG wnl with normal QTc interval  - Thiamine PO 200 mg TID x5 days, then transition to 100 mg daily   - Folate supplementation and multivitamin daily.  - Continue delirium protocol, and encouraged movement and orientation strategies    Endocrine: Patient with hyperglycemia (220 on 2/4) in setting of prednisone.   - Adult Endocrinology consulted, appreciate recs  - POC BG monitoring q6 hrs while on TF  - Regular insulin sensitive correctional scale q6h while on TF. If he does oral feeds, will consider Lispro with oral feeding  - Goal BG of 140-180 while inpatient    Social:  - Consult Palliative Care to clarify patient's goals for admission  - arrange family meeting for 2/6 to discuss goals of care  ??  Lines: PIV, NGT  ??  Plan of care discussed with caregiver(s) at bedside.    Subjective:     Interval History: Overnight, there were no acute events. Patient was afebrile and vital signs were within normal range except for being mildly tachypneic. Tolerating continuous feeds however continued abdominal discomfort in the setting of ascites. Voiding and stooling appropriately.     Objective:     Vital signs in last 24 hours:  Temp:  [36.4 ??C-37.1 ??C] 36.9 ??C  Heart Rate:  [84-120] 86  SpO2 Pulse:  [85-101] 85  Resp:  [18-29] 26  BP: (114-128)/(61-82) 117/61  MAP (mmHg):  [78-96] 78  SpO2:  [96 %-100 %] 100 %  Intake/Output last 3 shifts:  I/O last 3 completed shifts:  In: 2770 [P.O.:870; NG/GT:1800; IV Piggyback:100]  Out: 1125 [Urine:1125]    Physical Exam:  General: Lying in bed comfortably, sleeping, responsive, in no acute distress, cooperative with exam  Head: atraumatic, normocephalic  Eyes: Scleral icterus present;  extraocular movements intact  Nose: Clear, no discharge, NG tube in place  Neck:  Full range of motion, supple, mild  Lungs:Rhonchorous bilaterally and tachypnea, no wheezing, crackles or rhonchi  Heart: Regular rate and rhythm, normal S1, S2, no murmurs or gallops.  Abdomen: Prior surgical scars present, abdomen soft, non-tender. Ascitic abdomen with fluid wave present. Active bowel sounds. Tenderness noted to palpation in all 4 quadrants  Neuro: Tremor at rest and with movement on UE bilaterally. AAO to person, place, year.Aterixis present.   Extremities:  2+ non-pitting pedal edema present bilaterally. Moves all extremities. Edema of the right elbow/forearm.   Skin: rash/acne over the face. Normal skin turgor; no bruising, rashes or lesions noted    Studies: Personally reviewed  and interpreted.    Labs/Studies:  Labs and Studies from the last 24hrs per EMR and Reviewed and   All lab results last 24 hours:    Recent Results (from the past 24 hour(s))   Type and Screen    Collection Time: 12/17/19 10:57 AM   Result Value Ref Range    Antibody Screen NEG     ABO Grouping B POS    Ammonia    Collection Time: 12/17/19 10:57 AM   Result Value Ref Range    Ammonia 11 9 - 33 umol/L   Ionized Calcium, Venous    Collection Time: 12/17/19 10:57 AM   Result Value Ref Range    Calcium, Ionized Venous 5.26 4.40 - 5.40 mg/dL   CBC w/ Differential    Collection Time: 12/17/19 10:57 AM   Result Value Ref Range    WBC 12.3 (H) 4.5 - 11.0 10*9/L    RBC 2.74 (L) 4.50 - 5.90 10*12/L    HGB 7.8 (L) 13.5 - 17.5 g/dL    HCT 13.0 (L) 86.5 - 53.0 %    MCV 89.3 80.0 - 100.0 fL    MCH 28.6 26.0 - 34.0 pg    MCHC 32.0 31.0 - 37.0 g/dL    RDW 78.4 (H) 69.6 - 15.0 %    MPV 6.9 (L) 7.0 - 10.0 fL    Platelet 135 (L) 150 - 440 10*9/L    nRBC 11 (H) <=4 /100 WBCs    Variable HGB Concentration Moderate (A) Not Present    Neutrophil Left Shift 2+ (A) Not Present    Microcytosis Moderate (A) Not Present    Macrocytosis Moderate (A) Not Present    Anisocytosis Marked (A) Not Present    Hyperchromasia Slight (A) Not Present    Hypochromasia Marked (A) Not Present   Manual Differential    Collection Time: 12/17/19 10:57 AM   Result Value Ref Range    Neutrophils % 89 %    Lymphocytes % 4 %    Monocytes % 5 %    Eosinophils % 2 % Basophils % 0 %    Absolute Neutrophils 10.9 (H) 2.0 - 7.5 10*9/L    Absolute Lymphocytes 0.5 (L) 1.5 - 5.0 10*9/L    Absolute Monocytes 0.6 0.2 - 0.8 10*9/L    Absolute Eosinophils 0.2 0.0 - 0.4 10*9/L    Absolute Basophils 0.0 0.0 - 0.1 10*9/L    Smear Review Comments See Comment (A) Undefined    Polychromasia Slight (A) Not Present    Schistocytes Moderate (AA) Not Present    Spherocytes Present (A) Not Present    Acanthocytes Present (A) Not Present    Poikilocytosis Marked (A) Not Present   PT-INR    Collection Time: 12/17/19 10:58 AM   Result Value Ref Range    PT 19.5 (H) 10.5 - 13.5 sec    INR 1.68    Comprehensive Metabolic Panel    Collection Time: 12/17/19 10:58 AM   Result Value Ref Range    Sodium 139 135 - 145 mmol/L    Potassium 2.9 (L) 3.5 - 5.0 mmol/L    Chloride 112 (H) 98 - 107 mmol/L    Anion Gap      CO2      BUN 44 (H) 7 - 21 mg/dL    Creatinine 2.95 (H) 0.70 - 1.30 mg/dL    BUN/Creatinine Ratio 25     EGFR CKD-EPI Non-African American, Male 54 (L) >=60 mL/min/1.59m2  EGFR CKD-EPI African American, Male 46 >=60 mL/min/1.20m2    Glucose 200 (H) 70 - 179 mg/dL    Calcium 9.3 8.5 - 16.1 mg/dL    Albumin 2.9 (L) 3.5 - 5.0 g/dL    Total Protein      Total Bilirubin 36.3 (H) 0.0 - 1.2 mg/dL    AST 79 (H) 19 - 55 U/L    ALT      Alkaline Phosphatase 667 (H) 65 - 260 U/L   Magnesium Level    Collection Time: 12/17/19 10:58 AM   Result Value Ref Range    Magnesium 1.9 1.6 - 2.2 mg/dL   Phosphorus Level    Collection Time: 12/17/19 10:58 AM   Result Value Ref Range    Phosphorus 3.1 2.9 - 4.7 mg/dL     ========================================    Tora Duck, MD, MPH  Peds Intern, PGY-1  Pager: [phone]  December 17, 2019   2:54 PM    I supervised the resident physician on subsequent day care who spent at least 35 minutes on the floor or unit in direct patient care. The direct patient care time included face-to-face time with the patient, reviewing the patient's chart, communicating with the family and/or other professionals and coordinating care. Greater than 50% of this time was spent in counseling or coordinating care with the patient regarding importance of lab work, taking medications on time. He is refusing labs . I was available throughout care provided. Alita Chyle, MD

## 2019-12-18 NOTE — Unmapped (Addendum)
Endocrinology Inpatient Consult Note    Requesting Attending Physician :  Morrison Old, MD  Service Requesting Consult : Ped Gastroenterology [facility])  Primary Care Provider: [name], MD  Outpatient Endocrinologist: N/A    Assessment/Recommendations:  [name] Gross is a 19 y.o. male with h/o liver transplant admitted for AKI (acute kidney injury) (CMS-HCC). Endocrinology have been consulted to evaluate patient for hyperglycemia.    1) Steroid induced hyperglycemia: Single value of 200 which was not addressed with correctional. This is a challenging case given the patient is not amenable to collection of BG data nor insulin administration. Fortunately, on the current dose, what little data we have does not show more profound hyperglycemia. No new recommendations at this time.  -POC BG monitor Q6 hours on TF  -recommend starting regular insulin sensitive correctional scale Q6hrs while on TF  -If he reconstitutes a diet, will consider lispro with oral feeding.  -goal BG is 140-180 while inpatient.    The patient was seen and discussed with attending, Dr. Naoma Diener.    Thank you for the consult. Will sign off at this time as patient is refusing insulin and lab evaluations. Please call/page [phone] with questions or concerns.     --  Glean Hess, MD  Endocrinology Fellow [facility] at [facility]  Phone: [phone]   Fax: [phone]        History of Present Illness: :    Reason for Consult: Hyperglycemia  Interval History:  Declining all lab testing and exams. Did not receive correctional insulin due to refusal. He seems in better spirits today and is in the bedside chair with father at bedside. Seems contemplative about allowing labs.    Initial History  [name] Gross is a 19 y.o. male with h/o liver transplant admitted for AKI (acute kidney injury) (CMS-HCC). Endocrinology have been consulted to evaluate patient for hyperglycemia.    Patient is a 19 yo with a complicated medical history with current admission to [facility] for acute renal failure. He presented to an outside hospital [facility]) with several weeks of decreased oral intake and coffee ground emesis. He was found to be oliguric with a creatinine of 2.7 concerning for prerenal renal injury vs hepatorenal syndrome. 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 He is s/p stress dosed steroids in PICU. Currently on 15mg  prednisone. He will be started on TF based on review of pediatrics notes.     He has been NPO since admission.     Review of Systems:  A 12 system review of systems was negative except as noted in HPI.    Past Medical History:    Medical History:  Past Medical History:   Diagnosis Date   ??? 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 Depression    ??? MSSA bacteremia 08/27/2019   ??? Seasonal allergies    ??? Sickle cell trait (CMS-HCC)    ??? Strep Mitis Central line-associated bloodstream infection 08/27/2019       Surgical History:  Past Surgical History:   Procedure Laterality Date   ??? FRENULECTOMY, LINGUAL     ??? PR ERCP REMOVE FOREIGN BODY/STENT BILIARY/PANC DUCT N/A 08/20/2017    Procedure: ENDOSCOPIC RETROGRADE CHOLANGIOPANCREATOGRAPHY (ERCP); W/ REMOVAL OF FOREIGN BODY/STENT FROM BILIARY/PANCREATIC DUCT(S);  Surgeon: Tawanna Cooler  Gevena Barre, MD;  Location: [facility]URES [facility];  Service: Gastroenterology   ??? PR ERCP REMOVE FOREIGN BODY/STENT BILIARY/PANC DUCT N/A 06/04/2018    Procedure: ENDOSCOPIC RETROGRADE CHOLANGIOPANCREATOGRAPHY (ERCP); W/ REMOVAL OF FOREIGN BODY/STENT FROM BILIARY/PANCREATIC DUCT(S);  Surgeon: Chriss Driver, MD;  Location: GI PROCEDURES [facility];  Service: Gastroenterology   ??? PR ERCP STENT PLACEMENT BILIARY/PANCREATIC DUCT N/A 11/21/2016    Procedure: ENDOSCOPIC RETROGRADE CHOLANGIOPANCREATOGRAPHY (ERCP); WITH PLACEMENT OF ENDOSCOPIC STENT INTO BILIARY OR PANCREATIC DUCT;  Surgeon: Vonda Antigua, MD;  Location: GI PROCEDURES [facility];  Service: Gastroenterology   ??? PR ERCP,W/REMOVAL STONE,BIL/PANCR DUCTS N/A 08/20/2017    Procedure: ERCP; W/ENDOSCOPIC RETROGRADE REMOVAL OF CALCULUS/CALCULI FROM BILIARY &/OR PANCREATIC DUCTS;  Surgeon: Vonda Antigua, MD;  Location: GI PROCEDURES [facility];  Service: Gastroenterology   ??? PR ERCP,W/REMOVAL STONE,BIL/PANCR DUCTS N/A 06/04/2018    Procedure: ERCP; W/ENDOSCOPIC RETROGRADE REMOVAL OF CALCULUS/CALCULI FROM BILIARY &/OR PANCREATIC DUCTS;  Surgeon: Chriss Driver, MD;  Location: GI PROCEDURES [facility];  Service: Gastroenterology   ??? PR TRANSPLANT LIVER,ALLOTRANSPLANT N/A 09/22/2016    Procedure: LIVER ALLOTRANSPLANTATION; ORTHOTOPIC, PARTIAL OR WHOLE, FROM CADAVER OR LIVING DONOR, ANY AGE;  Surgeon: Doyce Loose, MD;  Location: MAIN OR [facility];  Service: Transplant   ??? PR TRANSPLANT,PREP DONOR LIVER, WHOLE N/A 09/22/2016    Procedure: [facility] STD PREP CAD DONOR WHOLE LIVER GFT PRIOR TNSPLNT,INC CHOLE,DISS/REM SURR TISSU WO TRISEG/LOBE SPLT;  Surgeon: Doyce Loose, MD;  Location: MAIN OR [facility];  Service: Transplant   ??? PR UPGI ENDOSCOPY W/US FN BX N/A 08/20/2017    Procedure: UGI W/ TRANSENDOSCOPIC ULTRASOUND GUIDED INTRAMURAL/TRANSMURAL FINE NEEDLE ASPIRATION/BIOPSY(S), ESOPHAGUS;  Surgeon: Vonda Antigua, MD;  Location: GI PROC[facility]URES [facility];  Service: Gastroenterology   ??? PR UPPER GI ENDOSCOPY,BIOPSY N/A 07/15/2016    Procedure: UGI ENDOSCOPY; WITH BIOPSY, SINGLE OR MULTIPLE;  Surgeon: Arnold Long Mir, MD;  Location: PEDS PROCEDURE ROOM [facility];  Service: Gastroenterology   ??? PR UPPER GI ENDOSCOPY,CTRL BLEED Left 05/05/2016    Procedure: UGI ENDOSCOPY; WITH CONTROL OF BLEEDING, ANY METHOD;  Surgeon: Arnold Long Mir, MD;  Location: PEDS PROCEDURE ROOM [facility];  Service: Gastroenterology   ??? PR UPPER GI ENDOSCOPY,CTRL BLEED N/A 05/12/2016    Procedure: UGI ENDOSCOPY; WITH CONTROL OF BLEEDING, ANY METHOD;  Surgeon: Annie Paras, MD;  Location: CHILDRENS OR [facility];  Service: Gastroenterology   ??? PR UPPER GI ENDOSCOPY,DIAGNOSIS N/A 09/21/2019    Procedure: UGI ENDO, INCLUDE ESOPHAGUS, STOMACH, & DUODENUM &/OR JEJUNUM; DX W/WO COLLECTION SPECIMN, BY BRUSH OR WASH;  Surgeon: Monte Fantasia, MD;  Location: GI PROC[facility]URES [facility];  Service: Gastroenterology   ??? PR UPPER GI ENDOSCOPY,DIAGNOSIS N/A 12/13/2019    Procedure: UGI ENDO, INCLUDE ESOPHAGUS, STOMACH, & DUODENUM &/OR JEJUNUM; DX W/WO COLLECTION SPECIMN, BY BRUSH OR WASH;  Surgeon: Neysa Hotter, MD;  Location: GI PROC[facility]URES [facility];  Service: Gastroenterology   ??? PR UPPER GI ENDOSCOPY,TUBE PLACE N/A 12/13/2019    Procedure: UGI ENDO; W/TRANSENDOSCOPIC TUBE/CATH PLCMT;  Surgeon: Neysa Hotter, MD;  Location: GI PROCEDURES [facility];  Service: Gastroenterology       Allergies:  Bee pollen and Pollen extracts    All Medications:   Current Facility-Administered Medications   Medication Dose Route Frequency Provider Last Rate Last Admin   ??? benzoyl peroxide 5 % gel   Topical Nightly David Stall, MD   Given at 12/17/19 2041   ??? calcium carbonate (OS-CAL) tablet 600 mg of elem calcium  600 mg of elem calcium Enteral tube: gastric  TID Janyth Contes, MD   600 mg of elem calcium at 12/17/19 2040   ??? cholecalciferol (vitamin D3) tablet 5,000 Units  5,000 Units Enteral tube: gastric  Daily Janyth Contes, MD   5,000 Units at 12/17/19 2841   ??? clindamycin (CLEOCIN T) 1 % external solution   Topical Nightly David Stall, MD   Given at 12/17/19 2041   ??? dextrose 50 % in water (D50W) 50 % solution 12.5 g  12.5 g Intravenous Q10 Min PRN David Stall, MD       ??? fluconazole (DIFLUCAN) tablet 100 mg  100 mg Enteral tube: gastric  Daily Janyth Contes, MD   100 mg at 12/17/19 0853   ??? hydroxyzine (ATARAX) capsule/tablet 25 mg  25 mg Enteral tube: gastric  Q6H PRN Janyth Contes, MD   25 mg at 12/16/19 2249   ??? insulin regular (HumuLIN,NovoLIN) injection 0-6 Units  0-6 Units Subcutaneous ACHS David Stall, MD   1 Units at 12/16/19 3244   ??? magnesium oxide (MAG-OX) tablet 400 mg  400 mg Enteral tube: gastric  BID Janyth Contes, MD   400 mg at 12/17/19 2040   ??? melatonin tablet 3 mg  3 mg Enteral tube: gastric  QPM Janyth Contes, MD   3 mg at 12/17/19 2041   ??? multivitamin, with zinc (AQUADEKS) chewable tablet  2 tablet Enteral tube: gastric  Daily Janyth Contes, MD   2 tablet at 12/17/19 0102   ??? mycophenolate (MYFORTIC) EC tablet 540 mg  540 mg Oral BID David Stall, MD   540 mg at 12/17/19 2040   ??? ondansetron (ZOFRAN) injection 4 mg  4 mg Intravenous Q8H PRN David Stall, MD   4 mg at 12/12/19 0128   ??? pantoprazole (PROTONIX) oral suspension  40 mg Enteral tube: gastric  BID Janyth Contes, MD   40 mg at 12/17/19 2040   ??? potassium chloride (KLOR-CON) packet 40 mEq  40 mEq Enteral tube: gastric  Daily Murvin Donning, MD       ??? potassium phosphate (monobasic) (K-PHOS) tablet 1,000 mg  1,000 mg Oral 4x Daily David Stall, MD   1,000 mg at 12/17/19 2040   ??? predniSONE (DELTASONE) tablet 20 mg  20 mg Enteral tube: gastric  Daily Janyth Contes, MD   20 mg at 12/17/19 7253   ??? rifaximin (XIFAXAN) oral suspension  550 mg Enteral tube: gastric  BID Janyth Contes, MD   550 mg at 12/17/19 2041   ??? spironolactone (ALDACTONE) tablet 50 mg  50 mg Enteral tube: gastric  Daily Janyth Contes, MD   50 mg at 12/17/19 6644   ??? thiamine (B-1) tablet 200 mg  200 mg Enteral tube: gastric  TID Janyth Contes, MD   200 mg at 12/17/19 2040   ??? valGANciclovir (VALCYTE) tablet 450 mg  450 mg Enteral tube: gastric  Q48H Janyth Contes, MD   450 mg at 12/17/19 0347       Social History:     Social History     Socioeconomic History   ??? Marital status: Single     Spouse name: None   ??? Number of children: None   ??? Years of education: None   ??? Highest education level: None   Occupational History   ??? None   Social Needs   ??? Financial resource strain: None   ??? Food insecurity     Worry: Sometimes  true     Inability: Sometimes true   ??? Transportation needs     Medical: None     Non-medical: None   Tobacco Use   ??? Smoking status: Never Smoker   ??? Smokeless tobacco: Never Used   ??? Tobacco comment: Step-father smokes outside   Substance and Sexual Activity   ??? Alcohol use: Never     Alcohol/week: 0.0 standard drinks     Frequency: Never     Comment: 1/2 glass of wine on rare occasions   ??? Drug use: Never   ??? Sexual activity: Never   Lifestyle   ??? Physical activity     Days per week: None     Minutes per session: None   ??? Stress: None   Relationships   ??? Social Wellsite geologist on phone: None     Gets together: None     Attends religious service: None     Active member of club or organization: None     Attends meetings of clubs or organizations: None     Relationship status: None   Other Topics Concern   ??? Do you use sunscreen? Not Asked   ??? Tanning bed use? No   ??? Are you easily burned? No   ??? Excessive sun exposure? No   ??? Blistering sunburns? No   Social History Narrative    Lives part time with mother and part time with MGM.          Family History:  The patient's family history includes Asthma in his brother; Diabetes in his maternal grandmother and paternal grandmother; Hypertension in his maternal grandfather and paternal grandfather..    Code Status:  Full Code      Objective: :  BP 92/43  - Pulse 101  - Temp 37.2 ??C (Oral)  - Resp (!) 35  - Ht 170.2 cm (5' 7.01)  - Wt 59.8 kg (131 lb 13.4 oz)  - SpO2 98%  - BMI 20.64 kg/m??   Declines formal exam  Gen: NAD  Psych: appropriate for conversation  Pulm: non-labored breathing        Test Results    Lab Results   Component Value Date    POCGLU 175 12/16/2019    POCGLU 117 09/24/2019    POCGLU 138 09/24/2019    POCGLU 165 09/23/2019    POCGLU 208 (H) 09/23/2019     Lab Results   Component Value Date    A1C 4.6 (L) 10/24/2019    A1C 5.0 08/17/2019    A1C 4.9 10/13/2018     Lab Results   Component Value Date    CREATININE 1.78 (H) 12/17/2019    NA 139 12/17/2019    K 2.9 (L) 12/17/2019    CL 112 (H) 12/17/2019    CO2  12/17/2019      Comment:      Specimen icteric.      ANIONGAP  12/17/2019      Comment:      Unable to calculate      CALCIUM 9.3 12/17/2019    ALBUMIN 2.9 (L) 12/17/2019     Lab Results   Component Value Date    WBC 12.3 (H) 12/17/2019    HGB 7.8 (L) 12/17/2019    PLT 135 (L) 12/17/2019     Lab Results   Component Value Date    AST 79 (H) 12/17/2019    ALT  12/17/2019      Comment:  Specimen icteric.      ALKPHOS 667 (H) 12/17/2019     Lab Results   Component Value Date    TSH 0.797 02/11/2018    FREET4 1.49 02/11/2018     Lab Results   Component Value Date    CHOL 159 09/30/2019    TRIG 79 09/30/2019    HDL  09/30/2019      Comment:      Specimen icteric.      LDL  09/30/2019      Comment:      NHLBI Recommended Ranges, LDL Cholesterol, for Adults (20+yrs) (ATPIII), mg/dL  Optimal              <213  Near Optimal        100-129  Borderline High     130-159  High                160-189  Very High            >=190  NHLBI Recommended Ranges, LDL Cholesterol, for Children (2-19 yrs), mg/dL  Desirable            <086  Borderline High     110-129  High                 >=130

## 2019-12-18 NOTE — Unmapped (Signed)
Pediatric Daily Progress Note     Assessment/Plan:     Principal Problem:    AKI (acute kidney injury) (CMS-HCC)  Active Problems:    History of liver transplant (CMS-HCC)    Major depressive disorder    Liver transplant rejection (CMS-HCC)  Resolved Problems:    * No resolved hospital problems. *    [name] Gross is a 19 y.o. 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 He is currently in stable condition however requires close monitoring in setting of poor compliance with current medical plan. Given the patient's ongoing chronic malnutrition and poor PO intake he requires continuous NG tube feeds. He has been compliant with PO medications today however he continues to refuse AM labs. Will have family meeting with Rendell and mother tomorrow morning 2/8 to create plan of care goals to ensure that he can be adequately treated for his medical conditions while admitted. He also refuses POC BG checks while trying to manage hyperglycemia however BG levels have not been very elevated. Patient continues to show signs of volume overload, including marked pedal edema, weight gain of 15lb since admission, and increasing bilateral pleural effusions on CXR. May provide additional dose of albumin today if patient allows for labs to be collected and if albumin <3 will provide dose. We will continue to monitor for a post-AKI diuresis and consider fluids per nephro recs if needed.     Acute Kidney Injury: Likely due to volume depletion in the setting of coffee ground emesis and tacrolimus use. Urine microscopy showing muddy brown casts c/w ATN.   - Peds nephrology following, appreciate recs.    - Per peds nephro, will consider restarting fluids if pt reaches net negative >500-700 mL. Currently net +1680   - Daily weights  - Renal U/S completed 2/3 - No hydronephrosis, renal parenchyma similar to prior  - Creatinine trend: 2/4 - 2.38, 2/5 - 2.27, 2/6 1.78 (prior to admission, creatinine 1.07-1.78)  - Medications are renally-dosed     FEN/GI  - Strict I/O  - Continuous feeds at 66ml/hr              - Flush min of 30 ml q6hr to maintain patency of tube   - Regular diet with calorie count  - Albumin 2/6 - 2.9 with goal of >3  - KPhos PO to 1000mg  bid   - K at 2.9 on 2/6  - Calcium carbonate 600mg  TID for bone health   - Vitamin D3 5000mg  qd   - IV Zofran 4mg  q8h PRN  - IV Protonix 40mg  BID  - Continue home aquadeks, mag-ox, vit D3     Candidal Esophagitis  - Fluconazole 100mg  every day (loading dose 400mg  given 2/2) for 13 days   - S/p vancomycin, cefepime and flagyl due to initial concern for SBP which is now unlikely               - NGTD on peritoneal cultures (1/31)    ??  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   - Fibrinogen low at 91. No interventions required at this time per adult hepatology. Will continue to defer to adult hepatology recs.   - CBC daily   - SCD's in place   - Will check INR every day    - INR 1.68 on 2/6     Psych:  - Psychiatry following, appreciate recs. Per Psych, patient's presentation of mood symptoms is consistent with MDD. Pt interested in starting Lexapro, but will hold until more medically stable.   - CBT with Psychology 1x/week  - Hydroxyzine 25mg  PO q6h PRN for anxiety and sleep   - Baseline EKG wnl with normal QTc interval  - Thiamine PO 200 mg TID x5 days, then transition to 100 mg daily   - Folate supplementation and multivitamin daily.  - Continue delirium protocol, and encouraged movement and orientation strategies    Endocrine: Patient with hyperglycemia (220 on 2/4) in setting of prednisone.   - Adult Endocrinology consulted, appreciate recs  - POC BG monitoring q6 hrs but refusing   - Regular insulin sensitive correctional scale q6h while on TF. If he does oral feeds, will consider Lispro with oral feeding  - Goal BG of 140-180 while inpatient    Social:  - Consult Palliative Care to clarify patient's goals for admission  - arrange family meeting for 2/6 to discuss goals of care  ??  Lines: PIV, NGT  ??  Plan of care discussed with caregiver(s) at bedside.    Subjective:     Interval History: Overnight, there were no acute events. Patient was afebrile and vital signs were within normal range except for being mildly tachypneic. Patient refused labs this morning but taking PO medications. Tolerating continuous feeds however continued abdominal discomfort in the setting of ascites. Voiding and stooling appropriately.     Objective:     Vital signs in last 24 hours:  Temp:  [36.8 ??C-37.2 ??C] 36.8 ??C  Heart Rate:  [95-110] 109  SpO2 Pulse:  [94-110] 102  Resp:  [22-35] 22  BP: (92-121)/(43-64) 107/64  MAP (mmHg):  [57-78] 77  SpO2:  [98 %-100 %] 98 %  Intake/Output last 3 shifts:  I/O last 3 completed shifts:  In: 2620 [P.O.:600; NG/GT:1920; IV Piggyback:100]  Out: 2150 [Urine:2150]    Physical Exam:  General: Lying in bed comfortably, sleeping, responsive, in no acute distress, cooperative with exam  Head: atraumatic, normocephalic  Eyes: Scleral icterus present;  extraocular movements intact  Nose: Clear, no discharge, NG tube in place  Neck:  Full range of motion, supple, mild  Lungs:Rhonchorous bilaterally and tachypnea, no wheezing, crackles or rhonchi  Heart: Regular rate and rhythm, normal S1, S2, no murmurs or gallops.  Abdomen: Prior surgical scars present, abdomen soft, non-tender. Ascitic abdomen with fluid wave present. Active bowel sounds. Tenderness noted to palpation in all 4 quadrants  Rectal: Exam deferred to tomorrow morning per patient  Neuro: Tremor at rest and with movement on UE bilaterally. AAO to person, place, year.Aterixis present.   Extremities:  2+ non-pitting pedal edema present bilaterally. Moves all extremities. Edema of the right elbow/forearm.   Skin: rash/acne over the face. Normal  skin turgor; no bruising, rashes or lesions noted    Studies: Personally reviewed and interpreted.    Labs/Studies:  Labs and Studies from the last 24hrs per EMR and Reviewed and   All lab results last 24 hours:    Recent Results (from the past 24 hour(s))   POCT Glucose    Collection Time: 12/18/19 12:11 PM   Result Value Ref Range    Glucose, POC 235 (H) 70 - 179 mg/dL     ========================================    Tora Duck, MD, MPH  Peds Intern, PGY-1  Pager: [phone]  December 18, 2019   2:35 PM      I supervised the resident physician on subsequent day care who spent at least 35 minutes on the floor or unit in direct patient care. The direct patient care time included face-to-face time with the patient, reviewing the patient's chart, communicating with the family and/or other professionals and coordinating care. Greater than 50% of this time was spent in counseling or coordinating care with the patient regarding Fluid overload, nutrition ATN , liver dysfunction . I was available throughout care provided. Alita Chyle, MD

## 2019-12-18 NOTE — Unmapped (Signed)
Kavon remains afebrile and VSS. Pt alert and oriented. Continues to refuse accu checks and phlebotomy draws. Compliant with all meds except requested a break and unable to complete Calcium. Has poor appetite but tolerating NGT feeds @ 60 mls/hr continuous. Voided and had stool overnight dad reports output mixed with blood. Small amount of blood noted on toilet. Father @ bedside assisting with care. Will monitor closely and follow plan of care.     Problem: Adult Inpatient Plan of Care  Goal: Plan of Care Review  Outcome: Progressing  Goal: Patient-Specific Goal (Individualization)  Outcome: Progressing  Goal: Absence of Hospital-Acquired Illness or Injury  Outcome: Progressing  Goal: Optimal Comfort and Wellbeing  Outcome: Progressing  Goal: Readiness for Transition of Care  Outcome: Progressing  Goal: Rounds/Family Conference  Outcome: Progressing

## 2019-12-18 NOTE — Unmapped (Signed)
VSS & afebrile today. Pt drowsy, more so than usual per mom, but arousable & oriented x4. Continues on vital AF continuous feeds at 55ml/hr with Q6H 30ml FWF--tolerating well. Pt refused labs collection this morning & later agreed. Continues to refuse all accuchecks despite explanation of importance in his care. 4+ pitting edema in feed/2+ pretibial pitting edema. Albumin given this evening. Voiding well--not all output measured due to inconsistent use of urinal (reiterated need to measure urine to pt). Stool x2 today, soft but formed per mom. Mom at bedside most of day & dad arrived this evening--both active in care.    Problem: Adult Inpatient Plan of Care  Goal: Plan of Care Review  Outcome: Ongoing - Unchanged  Goal: Patient-Specific Goal (Individualization)  Outcome: Ongoing - Unchanged  Goal: Absence of Hospital-Acquired Illness or Injury  Outcome: Ongoing - Unchanged  Goal: Optimal Comfort and Wellbeing  Outcome: Ongoing - Unchanged  Goal: Readiness for Transition of Care  Outcome: Ongoing - Unchanged  Goal: Rounds/Family Conference  Outcome: Ongoing - Unchanged     Problem: Skin Injury Risk Increased  Goal: Skin Health and Integrity  Outcome: Ongoing - Unchanged     Problem: Self-Care Deficit  Goal: Improved Ability to Complete Activities of Daily Living  Outcome: Ongoing - Unchanged     Problem: Fall Injury Risk  Goal: Absence of Fall and Fall-Related Injury  Outcome: Ongoing - Unchanged

## 2019-12-19 LAB — SODIUM URINE: Lab: 39

## 2019-12-19 LAB — BASIC METABOLIC PANEL
BUN / CREAT RATIO: 35
CALCIUM: 9.2 mg/dL (ref 8.5–10.2)
CHLORIDE: 115 mmol/L — ABNORMAL HIGH (ref 98–107)
CREATININE: 1.39 mg/dL — ABNORMAL HIGH (ref 0.70–1.30)
EGFR CKD-EPI AA MALE: 84 mL/min/{1.73_m2} (ref >=60)
EGFR CKD-EPI NON-AA MALE: 73 mL/min/{1.73_m2} (ref >=60)
GLUCOSE RANDOM: 185 mg/dL — ABNORMAL HIGH (ref 70–179)
POTASSIUM: 3.6 mmol/L (ref 3.5–5.0)
SODIUM: 140 mmol/L (ref 135–145)

## 2019-12-19 LAB — CHLORIDE: Chloride:SCnc:Pt:Ser/Plas:Qn:: 115 — ABNORMAL HIGH

## 2019-12-19 LAB — URINALYSIS
BACTERIA: NONE SEEN /HPF
BLOOD UA: NEGATIVE
KETONES UA: NEGATIVE
LEUKOCYTE ESTERASE UA: NEGATIVE
NITRITE UA: NEGATIVE
PH UA: 6 (ref 5.0–9.0)
PROTEIN UA: NEGATIVE
RBC UA: <1 /HPF (ref <=3)
SPECIFIC GRAVITY UA: 1.008 (ref 1.003–1.030)
UROBILINOGEN UA: 2 — AB
WBC UA: 1 /HPF (ref <=2)

## 2019-12-19 LAB — ALBUMIN
ALBUMIN: 2.8 g/dL — ABNORMAL LOW (ref 3.5–5.0)
Albumin:MCnc:Pt:Ser/Plas:Qn:: 2.8 — ABNORMAL LOW

## 2019-12-19 LAB — MAGNESIUM: Magnesium:MCnc:Pt:Ser/Plas:Qn:: 1.8

## 2019-12-19 LAB — INR: Coagulation tissue factor induced.INR:RelTime:Pt:PPP:Qn:Coag: 1.65

## 2019-12-19 LAB — BLOOD UA: Hemoglobin:PrThr:Pt:Urine:Ord:Test strip: NEGATIVE

## 2019-12-19 LAB — PHOSPHORUS: Phosphate:MCnc:Pt:Ser/Plas:Qn:: 3

## 2019-12-19 NOTE — Unmapped (Signed)
No acute events overnight.     CV: VSS and afebrile    N: quiet. Cooperative overnight. One dose of atarax given for some increased anxiety.     R: Clear - RA. Some increased WOB with vigorous movements and after emesis.     GI/GU: 2 episodes of emesis. Zofran given.     S: Feet puffy with pitting edema.     PIV SL'd. Dressing CDI. Grandma at bedside and active in care. Will continue to monitor.     Problem: Adult Inpatient Plan of Care  Goal: Plan of Care Review  Outcome: Ongoing - Unchanged  Goal: Patient-Specific Goal (Individualization)  Outcome: Ongoing - Unchanged  Goal: Absence of Hospital-Acquired Illness or Injury  Outcome: Ongoing - Unchanged  Goal: Optimal Comfort and Wellbeing  Outcome: Ongoing - Unchanged  Goal: Readiness for Transition of Care  Outcome: Ongoing - Unchanged  Goal: Rounds/Family Conference  Outcome: Ongoing - Unchanged     Problem: Skin Injury Risk Increased  Goal: Skin Health and Integrity  Outcome: Ongoing - Unchanged     Problem: Self-Care Deficit  Goal: Improved Ability to Complete Activities of Daily Living  Outcome: Ongoing - Unchanged     Problem: Fall Injury Risk  Goal: Absence of Fall and Fall-Related Injury  Outcome: Ongoing - Unchanged     Problem: Infection  Goal: Infection Symptom Resolution  Outcome: Ongoing - Unchanged

## 2019-12-19 NOTE — Unmapped (Signed)
Pediatric Palliative Care Consult Note    [name] Gross is a 19 y.o. male seen for pediatric palliative care consultation at the request of Dr. Andi Gross for family support.    Primary Care Provider: [name], MD    History provided by: Patient    Assessment and Summary of Discussion:   [name] Gross is a 19 y.o. 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  [name] Gross is followed by [name] hepatology teams as an outpatient and was being worked up for possible repeat liver transplant; now on hold due to acute illness, overall decline in health, and need for hospitalization.  Palliative care consult requested for family support.    Family meeting held today (2/8) with [name] Gross, his father-[name] Gross, [name]-[name] Gross, mother-[name] Gross by phone, peds GI team, and supportive care team to revisit [name] Gross' goals and priorities for his care.  Dr. Babette Gross discussed if the goal was to continue pursuing transplant, there would be treatments, procedures, interventions, and monitoring he would have to consent to, all while acknowledging these things could be difficult and challenging to manage given everything he had already been through.  On the other hand, if the goal was to maximize quality of life, a comfort focused care plan would also be appropriate.  It was explained that either path would be acceptable and would be supported by his care team, but being inconsistent with care (accepting some days, refusing other days) would not help accomplish either goal and would ultimately limit his options for future care.    [name] Gross shared his short term goal of being more comfortable which included removing his NG tube which was a significant source of discomfort. It was explained that his liver disease was contributing to his poor nutritional status, and there was concern from the care team and his family that he would not be able to consume enough calories by mouth despite his best efforts given he struggled with PO intake prior to this hospitalization.  [name] Gross was informed that given his current state of health, NG tube feeds were his only option for providing supplemental nutrition.  The NG tube could be removed for a PO trial, but it could hinder his chance at transplant if that was the path he wanted to pursue.    [name] Gross was informed he did not have to make decisions today.  Instead, he was encouraged to continue talking with his family.  His parents and grandmother all expressed their love for him, their support of him, and their wish that he did not have to be dealing with his illness/disease.  They acknowledged how difficult it was to let go and allow [name] Gross to direct his own care, but also wanted to respect his wishes.    [name] Gross shared that he felt lost.  The supportive care team reminded [name] Gross that conversations regarding goals of care and advance care plaining were fluid and could be revisited at any time.  He was also reminded that choosing one path does not prevent him from changing paths in the future if it no longer felt right and his goal and priorities changed, however if he wishes to pursue redo transplant, there may be a window outside of which this will no longer be possible because of further decline in his condition/inability to regain  adequate nutrition and conditioning for a safe transplant.  Our team again communicated our ongoing support of him and acknowledged his autonomy and right to self-determination.      Recommendations:   1. SYMPTOMS:   - Pain/discomfort: [name] Gross continues to report discomfort and throat pain r/t NG tube placement, likely worsened by candidal esophagitis.  ?? PO meds can be offered NG until he is more comfortable swallowing - FEN/GI: S/p EGD on on 2/2 w/ candidal esophagitis, no varices - treating with fluconazole (100 mg daily) and pantoprazole (40 mg BID).  Given [name]' overall poor nutritional status; weight loss and decreased PO intake, NG feeds were initiated.  Intermittent nausea reported.  ?? Receiving continuous NG feeds at 60 ml/hr.  ?? Able to have regular diet w/ calorie count as tolerated.  Intake remains poor.  ?? Agree with Zofran PRN for nausea.  - Mood: H/o major depressive disorder.  [name] Gross was willing to meet with our team, but he continues to appear very withdrawn.  He has described himself as feeling taxed and overexerted and has begun to refuse many aspects of his care.     ?? Appreciate involvement of psychiatry.  ?? Appreciate involvement of CLS.   - Sleep disturbance: Difficulty sleeping reported; mostly attributed to interruptions at night.  [name] Gross reported he tries to take a nap(s) during the day to make up for lost sleep at night.  ?? Recommend clustering care to allow  for periods of uninterrupted sleep when able to do so safely.  ?? Agree with melatonin, 3 mg every evening to promote sleep.  ?? Agree with hydroxyzine, 25 mg q6h PRN anxiety/sleep.     2. GOALS:   - [name] Gross prefers consistency with information and care when possible.  - Mother is concerned about [name] Gross' mental health and appreciates psychiatry's continued involvement.  - [name] Gross has long term goals which include going back to school and becoming a Runner, broadcasting/film/video or nurse.    3. DECISIONS:   - [name] Gross is now facing medical decisions regarding future care, including repeat transplant if he is a candidate vs prioritizing quality of life and focusing on comfort.  - Discussed roles of family members in supporting [name] in decision making. Mother expresses her wish that he was a minor so she could take this on for him as she's finding it difficult to watch him struggle to make decisions. Discussed pressures [name] Gross may feel around decision-making as a young person and a member of a family, reiterating that he is fortunate to have support from loving family members.     4. ADVANCE CARE PLANNING  - Code status: Full code.  - Health care power of attorney designated by [employer] during a previous admission include friend and paternal grandfather - will verify that this is still his preference now that his living situation and supports have changed  - Prognosis: Guarded given severity of liver disease and potential for lack of response to salvage therapies, as well as AKI, and complex social situation.    5. SUPPORT:   - [name] Gross has expanded his support network by reconnecting with many family members who have expressed their desire to support him and assist with his care.  He is currently living with his mother who is working to balance her role as mother with [name] Gross' right to autonomy and the right to direct his own care.  - Hospice (concurrent care) through [facility]    6. CARE COORDINATION:   - Care coordinated with peds GI team  and nursing. Also updated hospice.   - Supportive care team available for team / family meetings as needed.    Total time spent with patient for evaluation & management (excluding ACP documented separately): 75 minutes (1:30-2:45pm).  Greater than 50% of this time spent on counseling/coordination of care: Yes.  See ACP Note from today for additional billable service: No.      We appreciate the consult. The Children's Supportive Care Team can be reached by pager [phone] [name] Gross) or email ([email or web address]).       History of Present Illness:   [name] Gross is a 20 y.o. 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 Etiology of coffee ground emesis thought to be secondary to esophageal varices in context of longstanding portal hypertension.  [name] Gross was being evaluated for repeat liver transplant prior to this hospitalization.     Interval History: Stable on RA.  [name] Gross continues to show signs of volume overload; pedal edema, weight gain of 15lb since admission, and increasing bilateral pleural effusions on CXR.  Given his improving creatinine level tacrolimus was restarted this morning.  [name] Gross and his family were updated on his current clinical status.  Goals of care discussed during family meeting today.  See above.      Allergies:  Bee pollen and Pollen extracts    Medications:  Scheduled Meds:  ??? benzoyl peroxide   Topical Nightly   ??? calcium carbonate  600 mg of elem calcium Oral TID   ??? cholecalciferol (vitamin D3)  5,000 Units Enteral tube: gastric  Daily   ??? clindamycin   Topical Nightly   ??? fluconazole  100 mg Enteral tube: gastric  Daily   ??? insulin regular  0-6 Units Subcutaneous Q6H [facility]   ??? magnesium oxide  400 mg Enteral tube: gastric  BID   ??? melatonin  3 mg Enteral tube: gastric  QPM   ??? multivitamin, with zinc  2 tablet Enteral tube: gastric  Daily   ??? mycophenolate  540 mg Oral BID   ??? pantoprazole  40 mg Enteral tube: gastric  BID   ??? potassium chloride  40 mEq Enteral tube: gastric  Daily   ??? potassium phosphate (monobasic)  1,000 mg Oral BID   ??? predniSONE  20 mg Enteral tube: gastric  Daily   ??? rifaximin  550 mg Enteral tube: gastric  BID   ??? spironolactone  50 mg Enteral tube: gastric  Daily   ??? tacrolimus  0.5 mg Oral BID   ??? thiamine  200 mg Enteral tube: gastric  TID   ??? valGANciclovir  450 mg Enteral tube: gastric  Q48H     Continuous Infusions:    PRN Meds:.dextrose 50 % in water (D50W), hydroxyzine, ondansetron        Physical Exam:   Temp:  [36.8 ??C (98.3 ??F)-37.2 ??C (98.9 ??F)] 36.9 ??C (98.4 ??F)  Heart Rate:  [102-118] 112  SpO2 Pulse:  [107] 107  Resp:  [24-39] 35  SpO2:  [97 %-99 %] 97 %  BP: (99-118)/(44-72) 112/72  MAP (mmHg):  [61-84] 84     Gen: thin, frail adolescent male, sitting up in chair, NAD, soft-spoken, polite, tearful at times.  HEENT: sclera icteric, /AT.  Resp: normal WOB on RA  ABD: distended.  Extrem: BLE edema.  Neuro: awake/alert, oriented X 3, appropriate, clear speech, +tremors of hands/UE's.    Data: Reviewed in Epic.    [name] [facility], [place], CPNP  Children's Supportive Care Team  [phone] (pager)        Collaborating Physician Attestation    I conducted the interview and participated in the family meeting along with [facility], CPNP. I independently assessed Kiren, who was tired appearing and much more frail than when I last saw him in November. I counseled patient/family as above, focusing on goals of care, challenges of making decisions when you're feeling sick and when achieving a goal like liver transplant involves difficult and exhausting work. Family present and engaged, very supportive of Chucky, struggling a great deal emotionally over the thought of possibly losing him. I was involved with making the recommendations contained in this note, and communicated these recommendations to the primary service. 75 minutes as above (1:30-2:45) and additional 15 minutes (4:15-4:30) updating hospice nurse. They are available to participate in future conversations with Kenyata and family.     Therese Sarah, MD, MPH  Attending Physician, Children's Supportive Care Team

## 2019-12-19 NOTE — Unmapped (Signed)
Pediatric Tacrolimus Therapeutic Monitoring Pharmacy Note    [name] Gross is a 19 y.o. male continuing tacrolimus.     Indication: Liver transplant     Date of Transplant: 09/22/2016      Prior Dosing Information: Current regimen ON HOLD. Last dose 1 mg reportedly given on 1/29 @ ~0800. Most recent home regimen tacrolimus 1 mg (0.02 mg/kg) PO every 12 hours       Dosing Weight: 52.9 kg    Goals:  Therapeutic Drug Levels  Tacrolimus trough goal: 8-10 ng/mL    Additional Clinical Monitoring/Outcomes  ?? Monitor renal function (SCr and urine output) and liver function (LFTs)  ?? Monitor for signs/symptoms of adverse events (e.g., hyperglycemia, hyperkalemia, hypomagnesemia, hypertension, headache, tremor)    Results:   Tacrolimus level: Not applicable       Longitudinal Dose Monitoring:  Date Dose (mg), Route Level  (ng/mL) AM Scr (mg/dL) Key Drug Interactions   12/19/19 HOLD/0.5 mg  N/A 1.39 Fluconazole   12/14/19 NONE 2.8 2.4 Fluconazole     Pharmacokinetic Considerations and Significant Drug Interactions:  ? Concurrent hepatotoxic medications: fluconazole  ? Concurrent CYP3A4 substrates/inhibitors: fluconazole  ? Concurrent nephrotoxic medications: fluconazole (renally dose adjusted), spironolactone, valganciclovir ppx (renally dose adjusted)    Assessment/Plan:  Recommendedation(s)  Start tacrolimus 0.5 mg PO q12h at 2000 on 12/19/19. This is a reduced dose from home, due to his lingering AKI and upcoming increase back to 200 mg fluconazole (as renal function continues to stabilize).       Follow-up  ? Next level has been ordered on 12/22/19 at 0800  ? A pharmacist will continue to monitor and recommend levels as appropriate    Please page service pharmacist with questions/clarifications.    Kerman Passey, PharmD, BCPPS  Pediatric Clinical Pharmacist   [phone] office: [phone]

## 2019-12-20 LAB — BASIC METABOLIC PANEL
BLOOD UREA NITROGEN: 45 mg/dL — ABNORMAL HIGH (ref 7–21)
BUN / CREAT RATIO: 34
CALCIUM: 9.6 mg/dL (ref 8.5–10.2)
CHLORIDE: 108 mmol/L — ABNORMAL HIGH (ref 98–107)
CREATININE: 1.31 mg/dL — ABNORMAL HIGH (ref 0.70–1.30)
EGFR CKD-EPI AA MALE: >90 mL/min/{1.73_m2} (ref >=60)
GLUCOSE RANDOM: 185 mg/dL — ABNORMAL HIGH (ref 70–179)
POTASSIUM: 3.4 mmol/L — ABNORMAL LOW (ref 3.5–5.0)
SODIUM: 137 mmol/L (ref 135–145)

## 2019-12-20 LAB — ALBUMIN: Albumin:MCnc:Pt:Ser/Plas:Qn:: 3.5

## 2019-12-20 LAB — OSMOLALITY MEASURED: Osmolality:Osmol:Pt:Ser/Plas:Qn:: 298 — ABNORMAL HIGH

## 2019-12-20 LAB — PHOSPHORUS: Phosphate:MCnc:Pt:Ser/Plas:Qn:: 2.9

## 2019-12-20 LAB — CHLORIDE: Chloride:SCnc:Pt:Ser/Plas:Qn:: 108 — ABNORMAL HIGH

## 2019-12-20 LAB — VITAMIN D, TOTAL (25OH): Lab: 4.7 — ABNORMAL LOW

## 2019-12-20 LAB — PROTIME: Coagulation tissue factor induced:Time:Pt:PPP:Qn:Coag: 17.6 — ABNORMAL HIGH

## 2019-12-20 LAB — MAGNESIUM: Magnesium:MCnc:Pt:Ser/Plas:Qn:: 1.5 — ABNORMAL LOW

## 2019-12-20 NOTE — Unmapped (Signed)
Kolbee' VSS, afebrile overnight. Remains on RA. 1 PRN Atarax given for anxiety with good results. L FA PIV c/d/I, saline locked for most of night. Infused 25g of albumin followed by lasix, per orders. Tolerated well. Good UOP, UA X1 sent. Increased glucose noted on UA, but patient refused POC glucose checks overnight. MD Jimmey Ralph aware. NGT c/d/I, infusing continuous feeds @60mL /hr overnight. Paused for 1hr after small emesis episode. Mom at bedside throughout night, active in cares. Will continue to monitor.   Problem: Adult Inpatient Plan of Care  Goal: Plan of Care Review  Outcome: Ongoing - Unchanged     Problem: Adult Inpatient Plan of Care  Goal: Patient-Specific Goal (Individualization)  Outcome: Ongoing - Unchanged     Problem: Adult Inpatient Plan of Care  Goal: Absence of Hospital-Acquired Illness or Injury  Outcome: Ongoing - Unchanged     Problem: Adult Inpatient Plan of Care  Goal: Optimal Comfort and Wellbeing  Outcome: Ongoing - Unchanged

## 2019-12-20 NOTE — Unmapped (Signed)
I assumed care for Dnaiel around 12:30. He continues on tube feeds. Family meeting held this afternoon with members of the team. Poor Po intake, including poor intake of oral medications. Difficulty with complying aspects of care such as glucose checks and insulin. Family at bedside and supportive. Will continue to monitor.

## 2019-12-20 NOTE — Unmapped (Signed)
Pediatric Daily Progress Note     Assessment/Plan:     Principal Problem:    AKI (acute kidney injury) (CMS-HCC)  Active Problems:    Malnutrition (CMS-HCC)    History of liver transplant (CMS-HCC)    Major depressive disorder    Transaminitis    Liver transplant rejection (CMS-HCC)    Normocytic anemia    Hypoalbuminemia    Steroid-induced hyperglycemia    Thrombocytopenia (CMS-HCC)    Ascites of liver    Hyperbilirubinemia    ATN (acute tubular necrosis) (CMS-HCC)    Pleural effusion    Hypokalemia    Rectal bleeding    Coagulopathy (CMS-HCC)    Candidal esophagitis (CMS-HCC)    Fibrinogen decreased (CMS-HCC)  Resolved Problems:    * No resolved hospital problems. *    AMYR SLUDER is a 19 y.o. 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 He is currently in stable condition however requires close monitoring in setting of poor compliance with current medical plan. Given the patient's ongoing chronic malnutrition and poor PO intake he requires continuous NG tube feeds. He has been compliant with PO medications. Labs were collected today and showed improved potassium and downtrending creatinine (1.39). Family meeting was held today with family to discuss Wesley' desires for his medical care moving forward. He agreed to comply with lab draws however his biggest desire was to remove the NG tube. The benefits and risks of doing so was discussed with Herschell and his family with regards to his chronic malnutrition and need to optimize nutrition if he desires liver transplant. Will follow up with another family meeting in the coming weeks. Otherwise, he continues to show signs of volume overload, including marked pedal edema, weight gain of 15lb since admission, and increasing bilateral pleural effusions on CXR. Given his improving creatinine level and need to further diuresis, albumin and lasix were given. Tacrolimus was restarted this morning given improvement of creatinine.     Acute Kidney Injury: Likely due to volume depletion in the setting of coffee ground emesis and tacrolimus use. Urine microscopy showing muddy brown casts c/w ATN.   - Peds nephrology following, appreciate recs.    - Per peds nephro, will consider restarting fluids if pt reaches net negative >500-700 mL.  - Daily weights  - Renal U/S completed 2/3 - No hydronephrosis, renal parenchyma similar to prior  - Creatinine: 2/8 1.39 (prior to admission, creatinine 1.07-1.78)  - Medications are renally-dosed     FEN/GI  - Strict I/O  - Continuous feeds at 49ml/hr              - Flush min of 30 ml q6hr to maintain patency of tube   - Regular diet with calorie count  - Albumin 2/6 - 2.9 with goal of >3  - KPhos PO to 1000mg  bid   - K at 3.6 on 2/7  - Calcium carbonate 600mg  TID for bone health   - Vitamin D3 5000mg  qd   - IV Zofran 4mg  q8h PRN  - IV Protonix 40mg  BID  - Continue home aquadeks, mag-ox, vit D3     Candidal Esophagitis  - Fluconazole 100mg  every day (loading dose 400mg  given 2/2) for 13 days   - S/p vancomycin, cefepime and flagyl  due to initial concern for SBP which is now unlikely               - NGTD on peritoneal cultures (1/31)    ??  H/o acute on chronic liver rejection s/p liver transplant 2017  - Peds GI and adult hepatology following  - Ammonia 2/6 - 11   - Continue to hold bactrim prophylaxis in setting of AKI  - Tacrolimus restarted 0.5mg  BID  - Spironolactone 50mg  daily  - Mycophenolate 540mg  BID  - Rifaximin 550mg  BID  - Valganciclovir daily   - Prednisone 20mg  daily;               - s/p stress dosing in PICU     Heme: History of DVT in RUE now resolved on PVL 2/5, no signs of DVT during current admission.   - Fibrinogen low at 91. No interventions required at this time per adult hepatology. Will continue to defer to adult hepatology recs.   - CBC daily   - SCD's in place   - Will check INR every day    - INR 1.65 on 2/8     Psych:  - Psychiatry following, appreciate recs. Per Psych, patient's presentation of mood symptoms is consistent with MDD. Pt interested in starting Lexapro, but will hold until more medically stable.   - CBT with Psychology 1x/week  - Hydroxyzine 25mg  PO q6h PRN for anxiety and sleep   - Baseline EKG wnl with normal QTc interval  - Thiamine PO 200 mg TID x5 days, then transition to 100 mg daily   - Folate supplementation and multivitamin daily.  - Continue delirium protocol, and encouraged movement and orientation strategies    Endocrine: Patient with hyperglycemia (220 on 2/4) in setting of prednisone.   - Adult Endocrinology consulted, appreciate recs  - POC BG monitoring q6 hrs but refusing   - Regular insulin sensitive correctional scale q6h while on TF. If he does oral feeds, will consider Lispro with oral feeding  - Goal BG of 140-180 while inpatient    Social:  - Consult Palliative Care to clarify patient's goals for admission  - arrange family meeting for 2/6 to discuss goals of care  ??  Lines: PIV, NGT  ??  Plan of care discussed with caregiver(s) at bedside.    Subjective:     Interval History: Overnight, there were no acute events. Patient was afebrile and vital signs were within normal range except for being mildly tachypneic. Patient had small emesis overnight. Tolerating continuous feeds however continued abdominal discomfort in the setting of ascites. Voiding and stooling appropriately.     Objective:     Vital signs in last 24 hours:  Temp:  [36.9 ??C-37.2 ??C] 36.9 ??C  Heart Rate:  [102-118] 112  SpO2 Pulse:  [107] 107  Resp:  [30-39] 35  BP: (99-118)/(44-72) 112/72  MAP (mmHg):  [61-84] 84  SpO2:  [97 %-99 %] 97 %  Intake/Output last 3 shifts:  I/O last 3 completed shifts:  In: 2395 [P.O.:720; NG/GT:1575; IV Piggyback:100]  Out: 1300 [Urine:1300] Physical Exam:  General: Lying in bed comfortably, sleeping, responsive, in no acute distress, cooperative with exam  Head: atraumatic, normocephalic  Eyes: Scleral icterus present;  extraocular movements intact  Nose: Clear, no discharge, NG tube in place  Neck:  Full range of motion, supple, mild  Lungs: Rhonchorous bilaterally and tachypneic, no wheezing, crackles or rhonchi  Heart: Regular rate and rhythm, normal S1, S2, no murmurs or  gallops.  Abdomen: Prior surgical scars present, abdomen soft, non-tender. Ascitic abdomen with fluid wave present. Active bowel sounds. Tenderness noted to palpation in all 4 quadrants  Rectal: No fissures or tears present seen at anus, small abrasion present in right perianal region   Neuro: Tremor at rest and with movement on UE bilaterally. AAO to person, place, year. Aterixis present.   Extremities:  2+ non-pitting pedal edema present bilaterally. Moves all extremities. Edema of the right elbow/forearm.   Skin: rash/acne over the face. Normal skin turgor; no bruising, rashes or lesions noted    Studies: Personally reviewed and interpreted.    Labs/Studies:  Labs and Studies from the last 24hrs per EMR and Reviewed and   All lab results last 24 hours:    Recent Results (from the past 24 hour(s))   PT-INR    Collection Time: 12/19/19  8:19 AM   Result Value Ref Range    PT 19.2 (H) 10.5 - 13.5 sec    INR 1.65    Basic Metabolic Panel    Collection Time: 12/19/19  8:19 AM   Result Value Ref Range    Sodium 140 135 - 145 mmol/L    Potassium 3.6 3.5 - 5.0 mmol/L    Chloride 115 (H) 98 - 107 mmol/L    CO2      Anion Gap      BUN 49 (H) 7 - 21 mg/dL    Creatinine 3.87 (H) 0.70 - 1.30 mg/dL    BUN/Creatinine Ratio 35     EGFR CKD-EPI Non-African American, Male 78 >=60 mL/min/1.24m2    EGFR CKD-EPI African American, Male 28 >=60 mL/min/1.59m2    Glucose 185 (H) 70 - 179 mg/dL    Calcium 9.2 8.5 - 56.4 mg/dL   Magnesium Level    Collection Time: 12/19/19  8:19 AM   Result Value Ref Range    Magnesium 1.8 1.6 - 2.2 mg/dL   Phosphorus Level    Collection Time: 12/19/19  8:19 AM   Result Value Ref Range    Phosphorus 3.0 2.9 - 4.7 mg/dL   Albumin    Collection Time: 12/19/19  8:19 AM   Result Value Ref Range    Albumin 2.8 (L) 3.5 - 5.0 g/dL   POCT Glucose    Collection Time: 12/19/19  1:24 PM   Result Value Ref Range    Glucose, POC 288 (H) 70 - 179 mg/dL     ========================================    Tora Duck, MD, MPH  Peds Intern, PGY-1  Pager: [phone]  December 19, 2019   4:44 PM    I supervised the resident physician on subsequent day care who spent at least 35 minutes on the floor or unit in direct patient care. The direct patient care time included face-to-face time with the patient, reviewing the patient's chart, communicating with the family and/or other professionals and coordinating care. Greater than 50% of this time was spent in counseling or coordinating care with the patient regarding Fluid overload, nutrition ATN , liver dysfunction . I was available throughout care provided. Alita Chyle, MD

## 2019-12-20 NOTE — Unmapped (Signed)
HEPATOLOGY INPATIENT FOLLOW UP NOTE     Requesting Attending Physician:  Morrison Old, MD    Reason for Consult:  [name] Gross is a 19yo male with PMH cryptogenic cirrhosis s/p liver transplant 2017 c/b acute on chronic rejection leading to graft loss in the setting of non-adherence to medications, grade I EV (09/2019), ascites and RUE DVT who presents as a transfer from [facility] for further evaluation of end-stage liver disease.    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 Overall clinical decline likely due to worsening hepatic dysfunction in the setting of chronic rejection, which has been determined to be related to medication nonadherence.    Seen in consultation at the request of Dr. Morrison Old, MD for cirrhosis    Interval History:  - tube feeds at 60 mLs/hr  - Jimmie says his nausea is significantly improved  - his biggest complaint at this time is his fluid retention/edema      ASSESSMENT / PLAN:     #Concern for UGIB: EGD without varices, no e/o significant GI bleed. Had significant esophagitis, now on fluconazole, renally dosed.   --ok with checking hgb just once daily  --no e/o UGIB on EGD, did have bad esophagitis, which may have caused some oozing       #AKI: resolving. Creatinine back to his baseline.   Would continue to push diuretics as tolerates.  His biggest barrier to transplant is weakness/frailty, and he states that his fluid on his feet/ankles is making it very difficult for him to work on strength/conditioning. OK with increasing aldactone to 100 mg daily, and would check albumin daily. Would give 50 grams albumin 25% if serum albumin <3, followed by 40 mg of IV lasix.    -appreciate nephrology's input  -check serum albumin daily. 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 Overall clinical decline likely due to worsening hepatic dysfunction in the setting of chronic rejection, which has been determined to be related to medication nonadherence.  -Tacrolimus restarted, as renal function has improved. At 50% of home dose, as he is on fluconazole    -In December was deemed not appropriate transplant candidate due to poor nutrition and frailty. Adewale continues to get tube feeds at 60 mLs/hr, and has been tolerating this. I attempted to do the [facility] frailty score, but he was unable to complete the test. He understands that he needs to improve his physical strength before we could consider listing him for transplant. He tells me he wants to continue to pursue transplant, but is having difficulty with activity due to his edema. Would continue to diurese him as tolerated, continue to encourage PT/OT.    Will continue to assess patient, and will continue discussions regarding transplant.    MELD-Na score: 29 at 12/19/2019  8:19 AM  MELD score: 29 at 12/19/2019  8:19 AM  Calculated from:  Serum Creatinine: 1.39 mg/dL at 11/15/1094  0:45 AM  Serum Sodium: 140 mmol/L (Rounded to 137 mmol/L) at 12/19/2019  8:19 AM  Total Bilirubin: 36.3 mg/dL at 4/0/9811 91:47 AM  INR(ratio): 1.68 at 12/17/2019 10:58 AM  Age: 25 years 8 months      Thank you for this consult.  Please page [phone] with questions.    Ace Gins, NP-C  Transplant, Hepatology           Medications:  Current Facility-Administered Medications   Medication Dose Route Frequency Provider Last Rate Last Admin   ??? albumin human 25 % bottle 12.5 g  12.5 g Intravenous Once David Stall, MD       ??? benzoyl peroxide 5 % gel   Topical Nightly David Stall, MD   1 application at 12/19/19 2022   ??? calcium carbonate (TUMS) chewable tablet 600 mg of elem calcium  600 mg of elem calcium Oral TID Murvin Donning, MD   600 mg of elem calcium at 12/20/19 1204   ??? cholecalciferol (vitamin D3) tablet 5,000 Units  5,000 Units Enteral tube: gastric  Daily Janyth Contes, MD   5,000 Units at 12/20/19 0841   ??? clindamycin (CLEOCIN T) 1 % external solution   Topical Nightly David Stall, MD   1 application at 12/19/19 2023   ??? dextrose 50 % in water (D50W) 50 % solution 12.5 g  12.5 g Intravenous Q10 Min PRN David Stall, MD       ??? [START ON 12/21/2019] fluconazole (DIFLUCAN) tablet 200 mg  200 mg Enteral tube: gastric  Daily Murvin Donning, MD       ??? hydroxyzine (ATARAX) capsule/tablet 25 mg  25 mg Enteral tube: gastric  Q6H PRN Janyth Contes, MD   25 mg at 12/19/19 2043   ??? insulin regular (HumuLIN,NovoLIN) injection 0-6 Units  0-6 Units Subcutaneous Q6H [facility] David Stall, MD   3 Units at 12/19/19 1342   ??? magnesium oxide (MAG-OX) tablet 400 mg  400 mg Enteral tube: gastric  BID Janyth Contes, MD   400 mg at 12/20/19 0842   ??? melatonin tablet 3 mg  3 mg Enteral tube: gastric  QPM Janyth Contes, MD   3 mg at 12/19/19 2022   ??? multivitamin, with zinc (AQUADEKS) chewable tablet  2 tablet Enteral tube: gastric  Daily Janyth Contes, MD   2 tablet at 12/20/19 0841   ??? mycophenolate (MYFORTIC) EC tablet 540 mg  540 mg Oral BID David Stall, MD   540 mg at 12/20/19 0911   ??? ondansetron (ZOFRAN) injection 4 mg  4 mg Intravenous Q8H PRN David Stall, MD   4 mg at 12/18/19 2123   ??? pantoprazole (PROTONIX) oral suspension  40 mg Enteral tube: gastric  BID Janyth Contes, MD   40 mg at 12/20/19 0842   ??? potassium chloride (KLOR-CON) packet 40 mEq  40 mEq Enteral tube: gastric  Daily Murvin Donning, MD   40 mEq at 12/20/19 9147   ??? potassium phosphate (monobasic) (K-PHOS) tablet 1,000 mg  1,000 mg Oral BID David Stall, MD   1,000 mg at 12/20/19 0526   ??? predniSONE (DELTASONE) tablet 20 mg  20 mg Enteral tube: gastric  Daily Janyth Contes, MD   20 mg at 12/20/19 0842   ??? rifaximin (XIFAXAN) oral suspension  550 mg Enteral tube: gastric  BID Janyth Contes, MD   550 mg at 12/20/19 0842   ??? spironolactone (ALDACTONE) tablet 50 mg  50 mg Enteral tube: gastric  Daily Janyth Contes, MD   50 mg at 12/20/19 8295   ??? tacrolimus (PROGRAF) capsule 0.5 mg  0.5 mg Oral BID David Stall, MD   0.5 mg at 12/20/19 0842   ??? [START ON 12/21/2019] thiamine (B-1) tablet  100 mg  100 mg Enteral tube: gastric  Daily Murvin Donning, MD       ??? valGANciclovir (VALCYTE) tablet 450 mg  450 mg Enteral tube: gastric  Q48H Janyth Contes, MD   450 mg at 12/19/19 1020   ??? white petrolatum-mineral oil-lanolin topical cream 1 application  1 application Topical Daily PRN David Stall, MD           Vital Signs:  Temp:  [36.8 ??C (98.2 ??F)-37.1 ??C (98.8 ??F)] 36.8 ??C (98.2 ??F)  Heart Rate:  [91-114] 114  Resp:  [24-35] 24  BP: (100-119)/(66-72) 100/66  MAP (mmHg):  [77-85] 77  SpO2:  [97 %-100 %] 100 %    Intake/Output last 3 shifts:  I/O last 3 completed shifts:  In: 1470 [Blood:100; NG/GT:1370]  Out: 851 [Urine:850; Stool:1]    Physical Exam:  General appearance: Laying in bed, alert and oriented, interactive  HEENT: Icteric sclera  Cardiovascular: Regular rate  Pulmonary: Normal work of breathing. Acyanotic  Abdominal: soft, mildly tender throughout, mildly distended, no hepatomegaly, bedside US without significant ascites  Musculoskeletal: + temporal wasting. Normal joints of the hand.  Skin: + jaundice. No rashes.  Neurologic: Alert, oriented, and appropriate. Bilateral hand tremor  Psychiatric: Anxious/irritable.    Diagnostic Studies:   Labs:  No results for input(s): WBC, HGB, HCT, PLT in the last 72 hours.  Recent Labs     12/19/19  0819 12/20/19  0753   NA 140 137   K 3.6 3.4*   CL 115* 108*   BUN 49* 45*   CREATININE 1.39* 1.31*   GLU 185* 185*     Recent Labs     12/19/19  0819 12/20/19  0753   ALBUMIN 2.8* 3.5     Recent Labs     12/19/19  0819 12/20/19  0753   INR 1.65 1.51       Imaging/Procedures:    US Liver doppler 12/11/19  IMPRESSION:  - Patent hepatic transplant vasculature.  - Interval increase in resistive indices noted throughout the hepatic arteries which are within upper limits of normal/minimally elevated, as above.  - Increased diameter common bile duct. No stone identified.  - Heterogeneous liver echotexture, which is somewhat nonspecific, however, can be seen in setting of underlying liver disease. Slight interval decrease in liver measurement with increased size spleen.  - Large-volume abdominopelvic ascites, similar to prior.  - Bilateral echogenic kidneys, nonspecific, however, can be seen with chronic medical renal disease.

## 2019-12-20 NOTE — Unmapped (Signed)
Pediatric Daily Progress Note     Assessment/Plan:     Principal Problem:    AKI (acute kidney injury) (CMS-HCC)  Active Problems:    Malnutrition (CMS-HCC)    History of liver transplant (CMS-HCC)    Major depressive disorder    Transaminitis    Liver transplant rejection (CMS-HCC)    Normocytic anemia    Hypoalbuminemia    Steroid-induced hyperglycemia    Thrombocytopenia (CMS-HCC)    Ascites of liver    Hyperbilirubinemia    ATN (acute tubular necrosis) (CMS-HCC)    Pleural effusion    Hypokalemia    Rectal bleeding    Coagulopathy (CMS-HCC)    Candidal esophagitis (CMS-HCC)    Fibrinogen decreased (CMS-HCC)  Resolved Problems:    * No resolved hospital problems. *    [name] Gross is a 19 y.o. 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 He is currently in stable condition however requires close monitoring. Given the patient's ongoing chronic malnutrition and poor PO intake he requires continuous NG tube feeds. However, patient desires to remove the NG tube and continue PO feeding but is contemplating his goals of care given that making this decision given his chronic malnutrition will hinder his ability to receive liver transplant. Labs today revealed downtrending creatinine (1.31) and decreased potassium to 3.4 in the setting of lasix use overnight. Otherwise, fluconazole was increased to 200mg  daily given improved creatinine. Given that [name] Gross continues to improve, we are working to support him as he makes a decision in his goals of care moving forward (liver transplant vs comfort care). Of note, making this decision is complicated by his ongoing depression that is untreated. This may need to be revisited and addressed to provide him with the best ability to make a clear decision.    Acute Kidney Injury: Likely due to volume depletion in the setting of coffee ground emesis and tacrolimus use. Urine microscopy showing muddy brown casts c/w ATN.   - Peds nephrology following, appreciate recs.    - Per peds nephro, will consider restarting fluids if pt reaches net negative >500-700 mL.  - Daily weights  - Renal U/S completed 2/3 - No hydronephrosis, renal parenchyma similar to prior  - Creatinine: 2/9 1.31 (prior to admission, creatinine 1.07-1.78 in November 2020)  - Medications are renally-dosed     FEN/GI  - Strict I/O  - Continuous feeds at 29ml/hr              - Flush min of 30 ml q6hr to maintain patency of tube   - Regular diet with calorie count  - Albumin 2/6 - 2.9 with goal of >3  - KPhos PO to 1000mg  bid   - K at 3.6 on 2/7  - Calcium carbonate 600mg  TID for bone health   - Vitamin D3 5000mg  qd   - IV Zofran 4mg  q8h PRN  - IV Protonix 40mg  BID  - Continue home aquadeks, mag-ox, vit D3     Candidal Esophagitis  - Fluconazole 200mg  every day (loading dose 400mg  given 2/2) for 13 days   - S/p vancomycin, cefepime and flagyl due to initial concern for SBP which is now unlikely               -  No growth on peritoneal cultures collected on 1/31   ??  H/o acute on chronic liver rejection s/p liver transplant 2017  - Peds GI and adult hepatology following  - Ammonia 2/6 - 11   - Continue to hold bactrim prophylaxis in setting of AKI  - Tacrolimus restarted 0.5mg  BID  - Spironolactone 50mg  daily  - Mycophenolate 540mg  BID  - Rifaximin 550mg  BID  - Valganciclovir daily   - Prednisone 20mg  daily;               - s/p stress dosing in PICU     Heme: History of DVT in RUE now resolved on PVL 2/5, no signs of DVT during current admission.   - Fibrinogen low at 91. No interventions required at this time per adult hepatology. Will continue to defer to adult hepatology recs.   - CBC daily   - SCD's in place   - Will check INR every day    - INR 1.51 on 2/9     Psych:  - Psychiatry following, appreciate recs. Per Psych, patient's presentation of mood symptoms is consistent with MDD. Pt interested in starting Lexapro, but will hold until more medically stable.   - CBT with Psychology 1x/week  - Hydroxyzine 25mg  PO q6h PRN for anxiety and sleep   - Baseline EKG wnl with normal QTc interval  - Thiamine PO 100 mg daily   - Folate supplementation and multivitamin daily.  - Continue delirium protocol, and encouraged movement and orientation strategies    Endocrine: Patient with hyperglycemia (220 on 2/4) in setting of prednisone.   - Adult Endocrinology consulted, appreciate recs  - POC BG monitoring q6 hrs but refusing   - Regular insulin sensitive correctional scale q6h while on TF. If he does oral feeds, will consider Lispro with oral feeding  - Goal BG of 140-180 while inpatient    Social:  - Consult Palliative Care to clarify patient's goals for admission  ??  Lines: PIV, NGT  ??  Plan of care discussed with caregiver(s) at bedside.    Subjective:     Interval History: Overnight, there were no acute events. Albumin and lasix x1 given. Patient was afebrile and vital signs were within normal range except for being mildly tachypneic. Patient had two small emesis overnight. Tolerating continuous feeds however continued abdominal discomfort in the setting of ascites. Voiding and stooling appropriately.     Objective:     Vital signs in last 24 hours:  Temp:  [36.8 ??C-37.1 ??C] 36.9 ??C  Heart Rate:  [90-114] 90  Resp:  [18-27] 18  BP: (100-119)/(56-71) 101/56  MAP (mmHg):  [70-85] 70  SpO2:  [100 %] 100 %  Intake/Output last 3 shifts:  I/O last 3 completed shifts:  In: 1470 [Blood:100; NG/GT:1370]  Out: 851 [Urine:850; Stool:1]    Physical Exam:  General: Lying in bed comfortably, awake, in no acute distress, cooperative with exam  Head: atraumatic, normocephalic  Eyes: Scleral icterus present;  extraocular movements intact  Nose: Clear, no discharge, NG tube in place  Neck:  Full range of motion, supple  Lungs: Rhonchorous bilaterally and tachypneic, no wheezing, crackles or rhonchi  Heart: Regular rate and rhythm, normal S1, S2, no murmurs or gallops.  Abdomen: Prior surgical scars present, abdomen soft, non-tender. Ascitic abdomen with fluid wave present. Active bowel sounds. Tenderness noted to palpation in all 4 quadrants   Neuro: Tremor at rest and with movement on UE bilaterally. AAO to person, place, year.  Aterixis present.   Extremities:  2+ non-pitting pedal edema present bilaterally. Moves all extremities. Edema of the right elbow/forearm.   Skin: rash/acne over the face. Normal skin turgor; no bruising, rashes or lesions noted    Studies: Personally reviewed and interpreted.    Labs/Studies:  Labs and Studies from the last 24hrs per EMR and Reviewed and   All lab results last 24 hours:    Recent Results (from the past 24 hour(s))   Urinalysis    Collection Time: 12/19/19 10:10 PM   Result Value Ref Range    Color, UA Amber     Clarity, UA Clear     Specific Gravity, UA 1.008 1.003 - 1.030    pH, UA 6.0 5.0 - 9.0    Leukocyte Esterase, UA Negative Negative    Nitrite, UA Negative Negative    Protein, UA Negative Negative    Glucose, UA >/=500 mg/dL (A) Negative    Ketones, UA Negative Negative    Urobilinogen, UA 2.0 mg/dL (A) 0.2 mg/dL, 1.0 mg/dL    Bilirubin, UA Moderate (A) Negative    Blood, UA Negative Negative    RBC, UA <1 <=3 /HPF    WBC, UA 1 <=2 /HPF    Squam Epithel, UA <1 0 - 5 /HPF    Bacteria, UA None Seen None Seen /HPF    Mucus, UA Rare (A) None Seen /HPF   Sodium, Random Urine    Collection Time: 12/19/19 10:10 PM   Result Value Ref Range    Sodium, Ur 39 Undefined mmol/L   POCT Glucose    Collection Time: 12/20/19  7:52 AM   Result Value Ref Range    Glucose, POC 192 (H) 70 - 179 mg/dL   PT-INR    Collection Time: 12/20/19  7:53 AM   Result Value Ref Range    PT 17.6 (H) 10.5 - 13.5 sec    INR 1.51    Type and Screen    Collection Time: 12/20/19  7:53 AM   Result Value Ref Range    ABO Grouping B POS     Antibody Screen NEG    Basic Metabolic Panel    Collection Time: 12/20/19  7:53 AM   Result Value Ref Range    Sodium 137 135 - 145 mmol/L    Potassium 3.4 (L) 3.5 - 5.0 mmol/L    Chloride 108 (H) 98 - 107 mmol/L    CO2      Anion Gap      BUN 45 (H) 7 - 21 mg/dL    Creatinine 1.61 (H) 0.70 - 1.30 mg/dL    BUN/Creatinine Ratio 34     EGFR CKD-EPI Non-African American, Male 51 >=60 mL/min/1.56m2    EGFR CKD-EPI African American, Male >90 >=60 mL/min/1.74m2    Glucose 185 (H) 70 - 179 mg/dL    Calcium 9.6 8.5 - 09.6 mg/dL   Magnesium Level    Collection Time: 12/20/19  7:53 AM   Result Value Ref Range    Magnesium 1.5 (L) 1.6 - 2.2 mg/dL   Phosphorus Level    Collection Time: 12/20/19  7:53 AM   Result Value Ref Range    Phosphorus 2.9 2.9 - 4.7 mg/dL   Albumin    Collection Time: 12/20/19  7:53 AM   Result Value Ref Range    Albumin 3.5 3.5 - 5.0 g/dL   Osmolality, Serum    Collection Time: 12/20/19  7:53 AM   Result Value Ref Range  Osmolality Meas 298 (H) 275 - 295 mOsm/kg     ========================================    Tora Duck, MD, MPH  Peds Intern, PGY-1  Pager: [phone]  December 20, 2019   3:05 PM    supervised the resident physician on subsequent day care who spent at least 35 minutes on the floor or unit in direct patient care. The direct patient care time included face-to-face time with the patient, reviewing the patient's chart, communicating with the family and/or other professionals and coordinating care. Greater than 50% of this time was spent in counseling or coordinating care with the patient regarding Fluid overload, nutrition , liver dysfunction . I was available throughout care provided. Alita Chyle, MD

## 2019-12-20 NOTE — Unmapped (Signed)
Pediatric Palliative Care Consult Note      Primary Care Provider: [name], MD    History provided by: Patient and mother    Assessment and Summary of Discussion:   [name] Gross is a 19 y.o. 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  [name] Gross is followed by [facility] hepatology teams as an outpatient and was being worked up for possible repeat liver transplant; now on hold due to acute illness, overall decline in health, and need for hospitalization.  Palliative care consult requested for family support.    [name] Gross continues to express angst over being faced with the decision to try to do what's needed to achieve/maintain candidacy for redo liver transplant. He is exhausted and constantly feels ill, which makes it difficult to carry out all required tasks. He shared that he views his options as working toward a transplant to try to stay alive or giving up.  He is not able to articulate much beyond the notion of giving up even when prompted to consider what choosing to pursue a comfort plan of care might afford him the opportunity to do. He expressed today that he can't imagine continuing NG feeds any longer and wants to have the chance to take in necessary calories and fluids by mouth now that his esophagitis is being treated.     Mother remains hopeful for [name] Gross to stabilize and to become an actively listed transplant candidate but also sees numerous barriers. She wants to respect his autonomy as an adult but is struggling with fears of losing him and is having a difficult coping and knowing how best to support him without pressuring him.       Recommendations:   1. SYMPTOMS:   - Pain/discomfort: [name] Gross reports some back pain which he says is no worse than baseline. Legs/feet are uncomfortable due to edema but not painful  ?? PO meds can be offered NG until he is more comfortable swallowing  ?? Acetaminophen can be used sparingly for MSK pain  ?? He has also tried diclofenac gel and Ben-gay as an outpatient  ?? PT for stretching/strengthening    - FEN/GI: S/p EGD on on 2/2 w/ candidal esophagitis, no varices - treating with fluconazole (100 mg daily) and pantoprazole (40 mg BID).  Given [name] overall poor nutritional status; weight loss and decreased PO intake, NG feeds were initiated.  Intermittent nausea reported and is responsive to Zofran. Receiving continuous NG feeds at 60 ml/hr. Wants to remove NG tube.   ?? Able to have regular diet w/ calorie count as tolerated.  Intake remains poor and he thinks it will improve if NG feeds are stopped  ?? Agree with Zofran PRN for nausea.    - Mood: H/o major depressive disorder.  [name] Gross is polite and willing to engage, but he continues to appear withdrawn much of the time.  He has described himself as feeling taxed and overexerted and refuses many aspects of his care at times because of this.     ?? Psychiatry and Psychology following  ?? Working with Child Life    - Sleep disturbance: Difficulty sleeping reported; mostly attributed to interruptions at night.  [name] Gross reported he tries to take a nap(s) during the day to make up for lost sleep at  night.   ?? Recommend clustering care to allow  for periods of uninterrupted sleep when able to do so safely.  ?? Agree with melatonin, 3 mg every evening to promote sleep.  ?? Agree with hydroxyzine, 25 mg q6h PRN anxiety/sleep.    - Deconditioning: Related to severe liver disease, malnutrition. [name] Gross Gins NP from liver team attempted frailty test today which [name] Gross was unable to complete - see her note for details. He feels that PT has been beneficial outpatient when he's feeling better and knows he needs it inpatient     2. GOALS:   - [name] Gross prefers consistency with information and care when possible.  - Mother is concerned about [name] Gross' mental health and appreciates psychiatry's continued involvement.  - [name] Gross has long term goals which include going back to school and becoming a Runner, broadcasting/film/video or nurse.  - Discussed things to be hopeful for - he would like to be hopeful for transplant but is feeling overwhelmed about this. Can't imagine feeling hopeful if he doesn't pursue transplant/qualify for one as this would be giving up. Encouraged him to think about how he likes spending his time and how he might prioritize enjoyable things regardless of whether/not transplant is the path he pursues    3. DECISIONS:   - [name] Gross is now facing medical decisions regarding future care, including repeat transplant if he is a candidate vs prioritizing quality of life and focusing on comfort. He is feeling torn about what he wants and feels pressured by family and medical providers (for good reasons - they care deeply about him - but it's pressure nonetheless)  - Discussed roles of family members in supporting [name] Gross in decision making. Mother continues to expresse her wish that he was a minor so she could take this on for him as she's finding it difficult to watch him struggle to make decisions. Discussed pressures [name] Gross may feel around decision-making as a young person and a member of a family, reiterating that he is fortunate to have support from loving family members. Mother shares that she will support him if he chooses not to pursue transplant but she's finding it very difficult to cope with how much he's suffering and how much of his normal childhood he's lost to his disease and just wants to be able to give him another chance. She notes that the uncertainty of transplant and how much sicker he is this time than he was when he had his first transplant also make it difficult for her to imagine him going through it again. [name] Gross nodded in agreement but did not comment.     4. ADVANCE CARE PLANNING  - Code status: Full code.  - Health care power of attorney designated by [employer] during a previous admission include friend and paternal grandfather - will need to be verify that this is still his preference now that his living situation and supports have changed  - Prognosis: Guarded given severity of liver disease and potential for lack of response to salvage therapies, recurrent AKI on CKD, maintaining weight, continued decline despite much effort to maintain health    5. SUPPORT:   - Evaristo has expanded his support network by reconnecting with many family members who have expressed their desire to support him and assist with his care.  He is currently living with his mother who is working to balance her role as mother with Tayler' right to autonomy and the right to direct his own care.  - Hospice (concurrent care) through [name] Gross  -  Followed by Psychology, Psychiatry, Child Life  - Encouraged mother to work on self care - she has access to a PCP and to mental health services but is struggling to make time to see these providers. She agrees it's time to seek some support as she has been feeling more anxious and sometimes coping in ways that she thinks aren't very productive for her    6. CARE COORDINATION:   - Care coordinated with peds GI team, [name] Gross Gins NP, [name] Gross  - Supportive care team available for team / family meetings as needed.    Total time spent with patient for evaluation & management (excluding ACP documented separately): 65 minutes (12:15-1:20pm)   Greater than 50% of this time spent on counseling/coordination of care: Yes.  See ACP Note from today for additional billable service: No.      We appreciate the consult. The Children's Supportive Care Team can be reached by pager [phone] [name] Gross) or email ([email or web address]).       History of Present Illness:   [name] Gross is a 19 y.o. 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  Etiology of coffee ground emesis thought to be secondary to esophageal varices in context of longstanding portal hypertension.  Vashawn was being evaluated for repeat liver transplant prior to this hospitalization.     Interval History:  Khari is feeling exhausted today - not really sure why since he hasn't done much but does acknowledge emotional fatigue after difficult conversations during this admission and also feeling overwhelmed and frustrated by NG tube. Asked me to call the team to talk about removing it - thinks he can eat again now that he is no longer vomiting and his esophagitis is being treated. He knows this will be hard for his family but says it's important to him.     Met with mother and [name] Gross for a bit when she returned to his room, then continued conversation with her alone as [name] Gross fell asleep. She shared a great deal of grief over the prospect of losing [name] Gross. She has not found it helpful when family members tell her to pray/trust in  God and is not using her healthiest coping skills right now. Feeling anxious, struggling to sleep/rest. Taking FMLA to care for [name] Gross and is already dreading having to return to work next month.    See above for more details of visit    Allergies:  Bee pollen and Pollen extracts    Medications:  Scheduled Meds:  ??? albumin human  12.5 g Intravenous Once   ??? benzoyl peroxide   Topical Nightly   ??? calcium carbonate  600 mg of elem calcium Oral TID   ??? cholecalciferol (vitamin D3)  5,000 Units Enteral tube: gastric  Daily   ??? clindamycin   Topical Nightly   ??? [START ON 12/21/2019] fluconazole  200 mg Enteral tube: gastric  Daily   ??? insulin regular  0-6 Units Subcutaneous Q6H [facility]   ??? magnesium oxide  400 mg Enteral tube: gastric  BID   ??? melatonin  3 mg Enteral tube: gastric  QPM   ??? mycophenolate  540 mg Oral BID   ??? pantoprazole  40 mg Enteral tube: gastric  BID   ??? [START  ON 12/21/2019] MVW Complete (pediatric multivit 61-D3-vit K)  1 capsule Oral Daily   ??? potassium chloride  40 mEq Enteral tube: gastric  Daily   ??? potassium phosphate (monobasic)  1,000 mg Oral BID   ??? predniSONE  20 mg Enteral tube: gastric  Daily   ??? rifaximin  550 mg Enteral tube: gastric  BID   ??? spironolactone  50 mg Enteral tube: gastric  Daily   ??? tacrolimus  0.5 mg Oral BID   ??? [START ON 12/21/2019] thiamine  100 mg Enteral tube: gastric  Daily   ??? valGANciclovir  450 mg Enteral tube: gastric  Q48H     Continuous Infusions:    PRN Meds:.dextrose 50 % in water (D50W), hydroxyzine, ondansetron, white petrolatum-mineral oil-lanolin topical        Physical Exam:   Temp:  [36.8 ??C (98.2 ??F)-37.1 ??C (98.8 ??F)] 36.9 ??C (98.5 ??F)  Heart Rate:  [90-114] 90  Resp:  [18-27] 18  SpO2:  [100 %] 100 %  BP: (100-119)/(56-71) 101/56  MAP (mmHg):  [70-85] 70     Gen: thin, frail adolescent male, lying in bed, soft-spoken, polite, flat affect  HEENT: sclera icteric, [place]/AT.  Resp: normal WOB on RA  ABD: distended.  Extrem: BLE edema.  Neuro: awake/alert, oriented X 3, appropriate, clear speech, +tremors of hands/UE's.    Data: Reviewed in Epic.        Therese Sarah, MD, MPH  Attending Physician, [facility] Supportive Care Team

## 2019-12-21 NOTE — Unmapped (Signed)
Pediatric Daily Progress Note     Assessment/Plan:     Principal Problem:    AKI (acute kidney injury) (CMS-HCC)  Active Problems:    Malnutrition (CMS-HCC)    History of liver transplant (CMS-HCC)    Major depressive disorder    Transaminitis    Liver transplant rejection (CMS-HCC)    Normocytic anemia    Hypoalbuminemia    Steroid-induced hyperglycemia    Thrombocytopenia (CMS-HCC)    Ascites of liver    Hyperbilirubinemia    ATN (acute tubular necrosis) (CMS-HCC)    Pleural effusion    Hypokalemia    Rectal bleeding    Coagulopathy (CMS-HCC)    Candidal esophagitis (CMS-HCC)    Fibrinogen decreased (CMS-HCC)    Vitamin D deficiency  Resolved Problems:    * No resolved hospital problems. *    [name] Gross is a 19 y.o. 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 He is currently in stable condition. Yesterday, patient's NG tube was removed at his request. Patient expressed understanding of the challenges he may face in removing the NG tube. He has tolerated his PO diet thus far and has been compliant with take medications by mouth. He did refuse AM labs today. Transplant nutritionist spoke with patient and mother this morning to discuss caloric goals and ways to achieve these goals. Otherwise, given his improved renal function, valacyclovir was increased to 450mg  and spironolactone was increased to 100mg . Will monitor UOP, Cr, and potassium closely in the setting of increasing spironolactone and if well-tolerated can proceed with PRN lasix dosing to assist with reduction of edema. Additionally, psychiatry has recommended starting Remeron 7.5mg  nightly (cleared by pharmacy) to help manage depression, sleep, and appetite and if well tolerated will up-titrate dose. Lastly, given vitamin D level of 4.7, pharmacy recommended starting 50,000IU of vitamin D once weekly for 4 weeks and the restarting vitamin D3 5000mg  daily.     Acute Kidney Injury, resolving: Likely due to volume depletion in the setting of coffee ground emesis and tacrolimus use. Urine microscopy showing muddy brown casts c/w ATN.   - Peds nephrology following, appreciate recs.    - Per peds nephro, will consider restarting fluids if pt reaches net negative >500-700 mL.  - Daily weights  - Renal U/S completed 2/3 - No hydronephrosis, renal parenchyma similar to prior  - Creatinine: 2/9 1.31 (prior to admission, creatinine 1.07-1.78 in November 2020)  - Medications are renally-dosed     FEN/GI  - Strict I/O  - NG tube removed 2/9  - Sodium restricted diet (<2g) with calorie count   - 2000 kcal caloric goal daily   - Ensure clear 6x daily   - Albumin 2/6 - 2.9 with goal of >3  - KPhos PO to 1000mg  bid  - 50,000U Vita D once weekly for 4 weeks then transition to Vitamin D3 5000mg  qd   - Calcium carbonate 600mg  TID for bone health   - IV Zofran 4mg  q8h PRN  - IV Protonix 40mg  BID  - Continue home aquadeks, mag-ox    Candidal Esophagitis  - Day 8/13 Fluconazole 200mg  every day (loading dose 400mg  given on 2/2)   - S/p vancomycin, cefepime and flagyl due to initial concern for SBP which is now unlikely               -  No growth on peritoneal cultures collected on 1/31   ??  H/o acute on chronic liver rejection s/p liver transplant 2017  - Peds GI and adult hepatology following  - Ammonia 2/6 - 11   - Continue to hold bactrim prophylaxis in setting of AKI  - Tacrolimus 0.5mg  BID  - Spironolactone increased to 100mg  daily  - Mycophenolate 540mg  BID  - Rifaximin 550mg  BID  - Valganciclovir increased to 450mg  daily (renal function improved)  - Prednisone 20mg  daily;               - s/p stress dosing in PICU     Heme: History of DVT in RUE now resolved on PVL 2/5, no signs of DVT during current admission. - Fibrinogen low at 91. No interventions required at this time per adult hepatology. Will continue to defer to adult hepatology recs.   - CBC daily   - SCD's in place   - Will check INR every day    - INR 1.51 on 2/9     Psych:  - Psychiatry following, appreciate recs. Per Psych, patient's presentation of mood symptoms is consistent with MDD. Pt interested in starting Lexapro, but will hold until more medically stable.   - CBT with Psychology 1x/week  - Start Remeron 7.5mg  nightly   - Hydroxyzine 25mg  PO q6h PRN for anxiety and sleep   - Baseline EKG wnl with normal QTc interval  - Thiamine PO 100 mg daily   - Folate supplementation and multivitamin daily.  - Continue delirium protocol, and encouraged movement and orientation strategies    Endocrine: Patient with hyperglycemia (220 on 2/4) in setting of prednisone.   - Adult Endocrinology consulted, appreciate recs  - POC BG monitoring q6 hrs but refusing   - Regular insulin sensitive correctional scale q6h while on TF. If he does oral feeds, will consider Lispro with oral feeding  - Goal BG of 140-180 while inpatient    Social:  - Consult Palliative Care to clarify patient's goals for admission  ??  Lines: PIV, NGT  ??  Plan of care discussed with caregiver(s) at bedside.    Subjective:     Interval History: Overnight, there were no acute events. Patient was afebrile and vital signs were within normal range except for being mildly tachypneic. Tolerating continuous feeds however continued abdominal discomfort in the setting of ascites. Voiding appropriately.     Objective:     Vital signs in last 24 hours:  Temp:  [36.4 ??C-37.1 ??C] 36.4 ??C  Heart Rate:  [90-106] 94  Resp:  [20-24] 20  BP: (97-111)/(46-66) 111/64  MAP (mmHg):  [61-78] 78  SpO2:  [100 %] 100 %  Intake/Output last 3 shifts:  I/O last 3 completed shifts:  In: 2010 [P.O.:1020; Blood:100; NG/GT:890]  Out: 851 [Urine:850; Stool:1]    Physical Exam:  General: Lying in bed comfortably, awake, in no acute distress, cooperative with exam  Head: atraumatic, normocephalic  Eyes: Scleral icterus present;  extraocular movements intact  Nose: Clear, no discharge  Neck:  Full range of motion, supple  Lungs: Rhonchorous bilaterally and tachypneic, no wheezing, crackles or rhonchi  Heart: Regular rate and rhythm, normal S1, S2, no murmurs or gallops.  Abdomen: Prior surgical scars present, abdomen soft, non-tender. Ascitic abdomen with fluid wave present. Active bowel sounds. Tenderness noted to palpation in all 4 quadrants   Neuro: Tremor at rest and with movement on UE bilaterally. AAO to person, place, year. Aterixis present.   Extremities:  2+ non-pitting pedal edema present bilaterally. Moves all extremities. Edema of the right elbow/forearm.   Skin: rash/acne over the face. Normal skin turgor; no bruising, rashes or lesions noted    Studies: Personally reviewed and interpreted.    Labs/Studies:  Labs and Studies from the last 24hrs per EMR and Reviewed and   All lab results last 24 hours:    No results found for this or any previous visit (from the past 24 hour(s)).  ========================================    Tora Duck, MD, MPH  Peds Intern, PGY-1  Pager: [phone]  December 21, 2019   1:24 PM    supervised the resident physician on subsequent day care who spent at least 35 minutes on the floor or unit in direct patient care. The direct patient care time included face-to-face time with the patient, reviewing the patient's chart, communicating with the family and/or other professionals and coordinating care. Greater than 50% of this time was spent in counseling or coordinating care with the patient regarding Fluid overload, nutrition ATN , liver dysfunction . I was available throughout care provided. Alita Chyle, MD

## 2019-12-21 NOTE — Unmapped (Signed)
[name]  Follow-Up Psychiatry Consult Note     Service Date: December 21, 2019  LOS:  LOS: 10 days      Assessment:   [name] Gross is a 19 y.o. male with pertinent past medical and psychiatric diagnoses of cryptogenic cirrhosis s/p liver transplant in 2017 with non-compliance to anti-rejection medication with multiple episodes of acute on chronic rejection leading to graft loss, with course complicated by MSSA and strep mitis bacteremia, right upper extremity DVT, G1 esophageal varices, ascites and limb swelling, and diarrhea, admitted 12/11/2019  1:30 AM as a transfer from [facility] where he initially presented with coffee ground emesis and decreased PO intake x2 weeks, found to have oliguria with an AKI (Cr 2.7), concerning for pre-renal AKI due to dehydration vs. hepatorenal syndrome (HRS) with possible GI bleed from known esophageal varices..  Patient was seen in consultation by Psychiatry at the request of [name], MD with [facility]) for evaluation of Depression and pt was diagnosed with historical diagnosis of MDD. Today, we are seeing pt to follow mood, sleep and mental status.     The patient's current presentation of persistent depressed mood, poor sleep, and poor appetite, is most consistent with known diagnosis of Major Depressive Disorder.  Patient and mom would like to start standing antidepressant again, and are agreeable to retrying Remeron, particularly given patient's recent struggle with low appetite and difficulty sleeping as well as less risk for bleeding, hyponatremia, and drug-drug interactions as compared to Lexapro.  Patient and his mother report good efficacy from Remeron in the past.    At present we recommend starting Remeron at 7.5mg  at bedtime with plan for further titration.    Please see below for detailed recommendations.    Diagnoses:   Active Hospital problems:  Principal Problem:    AKI (acute kidney injury) (CMS-HCC)  Active Problems: Malnutrition (CMS-HCC)    History of liver transplant (CMS-HCC)    Major depressive disorder    Transaminitis    Liver transplant rejection (CMS-HCC)    Normocytic anemia    Hypoalbuminemia    Steroid-induced hyperglycemia    Thrombocytopenia (CMS-HCC)    Ascites of liver    Hyperbilirubinemia    ATN (acute tubular necrosis) (CMS-HCC)    Pleural effusion    Hypokalemia    Rectal bleeding    Coagulopathy (CMS-HCC)    Candidal esophagitis (CMS-HCC)    Fibrinogen decreased (CMS-HCC)    Vitamin D deficiency       Problems edited/added by me:  No problems updated.    Safety Risk Assessment:  A full risk assessment was previously performed on 2/1.  Risk assessment remains essentially unchanged. Patient remains not at acutely elevated risk.       Recommendations:   ## Safety:   -- No acute safety concerns.  Please see safety assessment for further discussion.    ## Medications:   -- START Remeron 7.5 mg qhs  -- Continue hydroxyzine 25 mg PO q6h PRN anxiety, sleep  -- Continue Thiamine PO/NGT 100 mg daily.  -- Continue Folate supplementation and multivitamin daily.  -- Continue Melatonin 3 mg q1800 for sleep and circadian rhythm regulation.   -- Agree with vitamin D supplementation    ## Medical Decision Making Capacity:   -- A formal capacity assessement was not performed as a part of this evaluation.  If specific capacity questions arise, please contact our team as below.     ## Further Work-up:   --  Please obtain EKG  -- While the patient is receiving medications (such as hydroxyzine) that may prolong QTc and increase risk for torsades:     - MONITOR and KEEP Mg>2 and K>4      - MONITOR QTc regularly.  If QTc on tele strip >476ms, obtain 12-lead EKG.    ## Disposition:   -- There are no psychiatric contraindications to discharging this patient when medically appropriate.   -- Agree with psychology speaking with patient     ## Behavioral / Environmental:   -- Please continue Delirium (prevention) protocol detailed in initial consult note.    Thank you for this consult request. Recommendations have been communicated to the primary team.  We will follow as needed at this time. Please page  [phone] or [phone] (after hours)  for any questions or concerns.     This patient was evaluated in person.    Discussed with and seen by attending, Elnita Maxwell, MD who agrees with the above assessment and plan.    Kristopher Oppenheim, MD      Interval History:     Updates to hospital course since last seen by psychiatry: Micah Flesher for EGD, found to have candidal esophagitis without varices. Started on fluconazole. Creatinine was worsening, nephrology was consulted, vancomycin was discontinued and other meds were renally dosed. Patient met with palliative care and is still currently deciding on comfort care vs more aggressive treatment - he indicated that he preferred to defer further decision making for the time being, though requested his NGT be removed.  He continues to report anxiety/ambivalence around his course of care.  He has also been sleeping poorly recently and noting poor appetite.    Patient Interview:    Patient seated in hospital bed, appeared fatigued but alert and oriented times 4.  He stated that he felt OK in terms of meed, but that he has been struggling with sleep and stated that his appetite is pretty bad.  He agreed that he has been experiencing some anxiety around his illness that has made decision-making more challenging.  He and his mother discussed his recent goals of care/palliative meeting, and he and his mother noted some disagreement in their desire for him to pursue ongoing medical interventions, however, she agreed that the decision was his to make. Both the patient and his mother agreed that medication for sleep, anxiety and appetite would be a good idea and noted that Remeron had been helpful for all these in the past.  He discontinued Remeron several years ago due to feeling like he diddn't need it anymore, but stated he would like to try it again. Denied SI/HI.    ROS:   All systems reviewed as negative/unremarkable aside from the following pertinent positives and negatives: negative for difficulties with sleep    Collateral:   - Reviewed medical records in Epic    Relevant Updates to past psychiatric, medical/surgical, family, or social history: reviewed and no new updates.    Current Medications:  Scheduled Meds:   benzoyl peroxide   Topical Nightly    calcium carbonate  600 mg of elem calcium Oral TID    cholecalciferol (vitamin D3)  5,000 Units Enteral tube: gastric  Daily    clindamycin   Topical Nightly    fluconazole  200 mg Enteral tube: gastric  Daily    insulin regular  0-6 Units Subcutaneous Q6H SCH    magnesium oxide  400 mg Enteral tube: gastric  BID    melatonin  3 mg Enteral tube: gastric  QPM    mycophenolate  540 mg Oral BID    pantoprazole  40 mg Oral BID    MVW Complete (pediatric multivit 61-D3-vit K)  1 capsule Oral Daily    potassium chloride  40 mEq Enteral tube: gastric  Daily    potassium phosphate (monobasic)  1,000 mg Oral BID    predniSONE  20 mg Enteral tube: gastric  Daily    rifAXIMin  550 mg Oral BID    spironolactone  50 mg Enteral tube: gastric  Daily    tacrolimus  0.5 mg Oral BID    thiamine  100 mg Enteral tube: gastric  Daily    valGANciclovir  450 mg Enteral tube: gastric  Q48H     Continuous Infusions:     PRN Meds:.dextrose 50 % in water (D50W), hydroxyzine, ondansetron, white petrolatum-mineral oil-lanolin topical      Objective:   Vital signs:   Temp:  [36.8 ??C (98.2 ??F)-37.1 ??C (98.7 ??F)] 36.8 ??C (98.2 ??F)  Heart Rate:  [90-114] 106  Resp:  [18-24] 20  BP: (100-109)/(55-66) 102/58  MAP (mmHg):  [70-77] 71  SpO2:  [100 %] 100 %    Physical Exam:  Gen: No acute distress.  Pulm: Normal work of breathing.  Neuro/MSK: Normal bulk/tone. Gait/station deferred given acutely ill.  Skin: jaundiced skin and eyes     Mental Status Exam:  Appearance:  appears stated age and ill-appearing Attitude:   calm and cooperative   Behavior/Psychomotor:  appropriate eye contact and tremors of the upper extremities   Speech/Language:   normal rate, volume, tone, fluency   Mood:  OK   Affect:  Calm, constricted, dysthymic   Thought process:  logical, linear, clear, coherent, goal directed   Thought content:    denies thoughts of self-harm. Denies SI, plans, or intent. Denies HI.  No grandiose, self-referential, persecutory, or paranoid delusions noted.   Perceptual disturbances:   denies auditory and visual hallucinations   Attention:  able to fully attend without fluctuations in consciousness, able to perform DOW backwards.   Concentration:  Able to fully concentrate and attend   Orientation:  Oriented to person, place, city, date and situation.   Memory:  not formally tested, but grossly intact   Fund of knowledge:   not formally assessed   Insight:    Impaired   Judgment:   Limited   Impulse Control:  Limited       Data Reviewed:  I reviewed labs from the last 24 hours.     Additional Psychometric Testing:  Not applicable.

## 2019-12-21 NOTE — Unmapped (Signed)
Confidential Psychological Therapy Session  [facility] Transplant Care    Patient Name: [name] Gross  Medical Record Number: [id number]  Date of Service: December 20, 2019  Clinical Psychologist: Artemio Aly, PhD  Intern: None  Time Spent: 20 min of face-to-face counseling  CPT Procedure Code: 62952 (30 min psychotherapy with patient and/or family)  Therapy Type: Behavior Modifying/Cognitive Behavioral Therapy (CBT)  Purpose of Treatment: adjustment to chronic medical illness,??improve coping,??reduce depression symptoms.    Referral/Relevant History:  Mr.??[name] Gross??is a very pleasant 19 y.o.??male??who presents for cognitive behavioral therapy to address adjustment to chronic medical illness (and recent hospitalization for liver transplant rejection). He was seen by the adult inpatient psychiatry team on 08/23/19 for a history of MDD (and two previous suicide attempts, last attempt April 2019) and current depressive symptoms. During assessment??with psychiatry, [name] declined pharmacotherapy but stated that he would be interested in receiving therapy services while inpatient to work through his difficult medical diagnoses and family situation.??He was seen by this provider for an initial visit on 08/29/19 and noted improved mood after reconnecting with social support, and requested continued psychology follow-up while inpatient. He was seen again by inpatient psychiatry on 09/01/19 and continued to decline pharmacotherapy, but later expressed interest in this and was prescribed venlafaxine prior to discharge from the hospital on 09/10/19??but was discontinued during a hospitalization on 09/18/19 due to potential??AMS and??contribution to a hand tremor.  ??  [name] was seen by this provider on 09/13/19 following discharge from the hospital for therapy, but then re-presented to the hospital on 09/15/19 for elevated creatinine prior to leaving AMA. He was then re-admitted to the hospital on 09/18/19 for a fever??and was seen again during his inpatient stay by this provider on 09/20/19. He has been seen for 3 outpatient appts with writer (last seen 10/24/19), but re-presented to the hospital on 12/11/19 for 2-week history of coffee ground emesis. He was found to have candidal esophagitis and oliguric AKI. Since his previous visit with Clinical research associate, there have been concerns that [name] had been declining treatment and glucose checks, and had made some comments about hitting a breaking point and wanting to come off the transplant list. Today is his 2nd inpatient session with writer during current hospitalization.     Review of Symptoms/ROS: Deferred    Subjective:   [name] was very tired today, and participated minimally in today's session. He did express that he felt tired and continues to be aggravated by frequent medical checks and procedures. He acknowledged that he had declined some BG checks and intrusive procedures, though he noted that after the family meeting yesterday, he has relented in that somewhat and let them take it. He appears to be wanting to continue current care with a goal to become eligible for transplant, but there is marked ambivalence at this time given his fatigue and frustration.     [name] denied feeling depressed at this time, though he continues to look depressed and tired. He noted that he would feel much better if he could get a good night's sleep, as his sleep has been frequently interrupted by getting multiple checks overnight. He did note that he has tried to remain active and engaged with enjoyable activities as possible, like watching horror movies on his phone or watching video game streamers on YouTube. He was encouraged to continue utilizing these skills to improve mood and distract him from frustrating procedures.     Mr. Vane' mother [name] Gross was also present  for much of today's session, and notes that it has been stressful over the past few days. She reported feeling anxious herself, but was able to take a month off of work recently to help care for him. She requested and was provided with resources for how to find a MH provider in [place].     Objective:     Mental Status Exam:  Appearance:??Malnourished and??jaundiced,??and very tired.  Motor:??Hand tremor noted while trying to pick up his drink.  Speech/Language:??Quiet speech, not very expressive today.??  Mood:??Tired, annoyed  Affect:??Sad and very flat, but he noted that he has always been quiet and private at baseline.  Thought Process:??Logical, linear, clear, coherent, goal directed  Thought Content:??Denies SI, HI, self harm, delusions, obsessions, paranoid ideation, or ideas of reference  Perceptual Disturbances:??Denies auditory and visual hallucinations, behavior not concerning for response to internal stimuli  Orientation:??Oriented to person, place, time, and general circumstances  Attention:??Able to fully attend without fluctuations in consciousness  Concentration:??Distractible, likely due to significant fatigue  Memory:??Immediate, short-term, long-term, and recall grossly intact  Fund of Knowledge:??Consistent with level of education and development  Insight:??Fair  Judgment: Fair  Impulse Control:??Fair    Assessment:  [name] participated minimally in this CBT session due to significant fatigue today. Similar to his last session, [name] described his mood as annoyed, but expressed a lot more fatigue today, which he related to not getting good sleep at night. It seems that overall, he is frustrated that his health continues to decline despite efforts to get stronger and healthier. He denied feeling depressed or anxious recently, though he continues to look depressed due to being quiet and somewhat difficult to engage; he notes that this may be his baseline and that he is a quiet and private person in general.    Notably, it will be important to continue to have open discussions with [name] and his family about his goals of care, given recent comments about not wanting certain medical care and checks. Recommend ongoing support from palliative care, CCLS, and psychiatry; he did acknowledge that he would be willing to try a medication for mood if that was recommended and safe given his current kidney/liver function.     He appears to meet criteria for recurrent MDD, and??while he denies depressed mood at this time, his reports of feeling annoyed frequently may be a symptom of depression for him.??It is likely that continuing to promote ways to help [name] remain engaged and socially connected will be helpful. Continuing to promote??behavioral activation and goal setting??will also likely??be particularly useful, as feeling independent appears to be highly motivating. Additionally, exploring his values in life and finding ways to help him maintain some independence will likely be very beneficial. Improvements??in depression and anxiety symptoms can contribute to improved quality of life, functionality, and ability to cope with chronic illness.   ??  Focus on current treatment is??reduction of depressive symptoms, increasing behavioral activation,??learning cognitive restructuring strategies,??adjustment to chronic medical conditions.   ??  Focus on future sessions will include??values exploration, behavioral activation, learning cognitive coping strategies, relaxation strategies.  ??  Adherence concerns:??[name] left AMA after presenting to the hospital on 09/15/19, despite speaking with his [name] Gross [name] Gross. He reported that he understands why leaving AMA is concerning to the transplant team, but felt overwhelmed after being in excruciating pain and felt like he needed to leave.??He also previously reported that he had not been checking his blood sugar or taking insulin for about four  days due to finger pain; today, he noted that he has stopped checking his blood sugar altogether (though he indicated that this was supported by his doctors).??  ??  [name] appears to be struggling with the balance between wanting independence and needing medical assistance, but expressed a willingness to adhere to medical recommendations.??He also appears to be focusing on short-term outcomes and avoiding short-term discomfort, like not wanting to attend appointments if it is cold outside, despite the potential long-term impact this may have on the team's perception of his adherence.??He appears to be doing well with attendance to outpatient appts??and taking medication??recently.??  ??  Diagnostic Impression:??Major Depressive Disorder, Recurrent Episode, Moderate Severity, in early remission    Risk Assessment:  A suicide and violence risk assessment was performed as part of this evaluation. The patient is deemed to be at chronic elevated risk for self-harm/suicide given the following factors: male age 45-35, current treatment non-compliance, current diagnosis of depression, previous acts of self-harm and chronic severe medical condition. The patient is deemed to be at chronic elevated risk for violence given the following factors: male gender and younger age. These risk factors are mitigated by the following factors:lack of active SI/HI, no know access to weapons or firearms, supportive family, enjoyment of leisure actvities, expresses purpose for living and safe housing. There is no acute risk for suicide or violence at this time. The patient was educated about relevant modifiable risk factors including following recommendations for treatment of psychiatric illness and abstaining from substance abuse.    While future psychiatric events cannot be accurately predicted, the patient does not currently require  acute inpatient psychiatric care and does not currently meet [facility] involuntary commitment criteria.    Psychometric Testing: None administered today. Previous scores on the PHQ-9 and GAD-7 are shown below:  ??  PHQ-9 (depression symptoms):  ??  09/13/19: 3 (minimal)  10/03/19: 0 (none)  ??  GAD-7 (anxiety symptoms):  ??  09/13/19: 2 (minimal)  10/03/19: 4 (minimal)    Plan:  [name] will continue meeting with me for CBT, and was given instructions for therapeutic homework, including to continue using effective behavioral strategies for coping and distraction (watching videos on his cell phone).     [name] will be followed approximately once/week while inpatient. Should he be discharged before being seen again, he will be contacted to set up a telehealth follow-up or an in-person appt that coincides with his other appts.      [name] was given this writer's contact information with confidential voice mail number and instructed to call 911 for emergencies.

## 2019-12-21 NOTE — Unmapped (Signed)
[name] Gross' VSS, afebrile overnight. Remains on RA. No complaint of pain or n/v. R. FA PIV c/d/I, saline locked overnight. NGT pulled at change of shift, per patient request. NGT discontinue order in by MD. Rochele Pages all scheduled meds by mouth without difficulty- did not refuse any PO meds overnight. Good PO intake as well. Voiding well. Refused BG checks overnight, so no insulin given. Mom at bedside, active in cares. Will continue to monitor.   Problem: Adult Inpatient Plan of Care  Goal: Plan of Care Review  Outcome: Ongoing - Unchanged     Problem: Adult Inpatient Plan of Care  Goal: Patient-Specific Goal (Individualization)  Outcome: Ongoing - Unchanged     Problem: Adult Inpatient Plan of Care  Goal: Absence of Hospital-Acquired Illness or Injury  Outcome: Ongoing - Unchanged     Problem: Adult Inpatient Plan of Care  Goal: Optimal Comfort and Wellbeing  Outcome: Ongoing - Unchanged

## 2019-12-21 NOTE — Unmapped (Signed)
Supplemental MD orders signed by Dr. Jessee Avers and faxed back to [facility] at [phone].

## 2019-12-21 NOTE — Unmapped (Signed)
[name] Gross has remained afebrile, all other VSS. He continues on all medications as ordered, tolerating well. He has had good PO fluid intake today, but continues with decreased appetite and significant feelings of fullness and bloating, as well as loose stool. Unable to tolerate tube feeds this morning until about 1000, and requested they be stopped again at 1500. They have remained off at the patient's request. He also continues to refuse bld glucose checks and insulin, MD aware. PIV remains c/d/i. Mother at bedside and active in cares.        Problem: Adult Inpatient Plan of Care  Goal: Plan of Care Review  Outcome: Ongoing - Unchanged  Goal: Patient-Specific Goal (Individualization)  Outcome: Ongoing - Unchanged  Goal: Absence of Hospital-Acquired Illness or Injury  Outcome: Ongoing - Unchanged  Goal: Optimal Comfort and Wellbeing  Outcome: Ongoing - Unchanged  Goal: Readiness for Transition of Care  Outcome: Ongoing - Unchanged  Goal: Rounds/Family Conference  Outcome: Ongoing - Unchanged     Problem: Skin Injury Risk Increased  Goal: Skin Health and Integrity  Outcome: Ongoing - Unchanged     Problem: Self-Care Deficit  Goal: Improved Ability to Complete Activities of Daily Living  Outcome: Ongoing - Unchanged

## 2019-12-22 LAB — BASIC METABOLIC PANEL
BLOOD UREA NITROGEN: 38 mg/dL — ABNORMAL HIGH (ref 7–21)
BUN / CREAT RATIO: 32
CHLORIDE: 114 mmol/L — ABNORMAL HIGH (ref 98–107)
CREATININE: 1.2 mg/dL (ref 0.70–1.30)
EGFR CKD-EPI AA MALE: >90 mL/min/{1.73_m2} (ref >=60)
EGFR CKD-EPI NON-AA MALE: 87 mL/min/{1.73_m2} (ref >=60)
GLUCOSE RANDOM: 192 mg/dL — ABNORMAL HIGH (ref 70–179)
POTASSIUM: 5.5 mmol/L — ABNORMAL HIGH (ref 3.5–5.0)
SODIUM: 139 mmol/L (ref 135–145)

## 2019-12-22 LAB — SELENIUM: Selenium:MCnc:Pt:Ser/Plas:Qn:: 105

## 2019-12-22 LAB — PHOSPHORUS: Phosphate:MCnc:Pt:Ser/Plas:Qn:: 3.7

## 2019-12-22 LAB — INR: Coagulation tissue factor induced.INR:RelTime:Pt:PPP:Qn:Coag: 1.44

## 2019-12-22 LAB — ALBUMIN: Albumin:MCnc:Pt:Ser/Plas:Qn:: 2.9 — ABNORMAL LOW

## 2019-12-22 LAB — TACROLIMUS, TROUGH: Lab: 2.3 — ABNORMAL LOW

## 2019-12-22 LAB — MAGNESIUM: Magnesium:MCnc:Pt:Ser/Plas:Qn:: 1.4 — ABNORMAL LOW

## 2019-12-22 LAB — EGFR CKD-EPI NON-AA MALE
Glomerular filtration rate/1.73 sq M.predicted.non black:ArVRat:Pt:Ser/Plas/Bld:Qn:Creatinine-based formula (CKD-EPI): 87

## 2019-12-22 MED ORDER — FLUCONAZOLE 200 MG TABLET
ORAL_TABLET | Freq: Every day | ORAL | 0 refills | 3 days | Status: CP
Start: 2019-12-22 — End: ?
  Filled 2019-12-23: qty 3, 3d supply, fill #0

## 2019-12-22 MED ORDER — FUROSEMIDE 20 MG TABLET
ORAL_TABLET | Freq: Every day | ORAL | 0 refills | 30.00000 days | Status: CP
Start: 2019-12-22 — End: 2020-01-21
  Filled 2019-12-23: qty 15, 30d supply, fill #0

## 2019-12-22 MED ORDER — THIAMINE HCL (VITAMIN B1) 100 MG TABLET
ORAL_TABLET | Freq: Every day | ORAL | 0 refills | 60 days | Status: CP
Start: 2019-12-22 — End: 2020-02-20
  Filled 2019-12-23: qty 100, 100d supply, fill #0

## 2019-12-22 MED ORDER — HYDROXYZINE HCL 25 MG TABLET
ORAL_CAPSULE | Freq: Four times a day (QID) | ORAL | 0 refills | 0.00000 days | Status: CP | PRN
Start: 2019-12-22 — End: ?
  Filled 2019-12-23: qty 30, 7d supply, fill #0

## 2019-12-22 MED ORDER — PREDNISONE 20 MG TABLET
ORAL_TABLET | Freq: Every day | ORAL | 0 refills | 30.00000 days | Status: CP
Start: 2019-12-22 — End: ?
  Filled 2019-12-23: qty 30, 30d supply, fill #0

## 2019-12-22 MED ORDER — SPIRONOLACTONE 100 MG TABLET
ORAL_TABLET | Freq: Every day | ORAL | 0 refills | 30 days | Status: CP
Start: 2019-12-22 — End: 2020-01-21
  Filled 2019-12-23: qty 30, 30d supply, fill #0

## 2019-12-22 MED ORDER — PEDIATRIC MULTIVITAMIN NO.61-VIT D3 1,500 UNIT-VIT K 800 MCG CAPSULE
ORAL_CAPSULE | Freq: Every day | ORAL | 0 refills | 30.00000 days | Status: CP
Start: 2019-12-22 — End: ?

## 2019-12-22 MED ORDER — POTASSIUM PHOSPHATE, MONOBASIC 500 MG SOLUBLE TABLET
ORAL_TABLET | Freq: Two times a day (BID) | ORAL | 11 refills | 30.00000 days | Status: CP
Start: 2019-12-22 — End: 2020-12-21
  Filled 2019-12-23: qty 120, 30d supply, fill #0

## 2019-12-22 MED ORDER — MIRTAZAPINE 7.5 MG TABLET
ORAL_TABLET | Freq: Every evening | ORAL | 0 refills | 60 days | Status: CP
Start: 2019-12-22 — End: 2020-02-20
  Filled 2019-12-23: qty 30, 30d supply, fill #0

## 2019-12-22 MED ORDER — MYCOPHENOLATE SODIUM 180 MG TABLET,DELAYED RELEASE
ORAL_TABLET | Freq: Two times a day (BID) | ORAL | 11 refills | 30 days | Status: CP
Start: 2019-12-22 — End: 2020-12-21
  Filled 2020-01-09: qty 180, 30d supply, fill #0

## 2019-12-22 MED ORDER — TACROLIMUS 0.5 MG CAPSULE
ORAL_CAPSULE | Freq: Two times a day (BID) | ORAL | 11 refills | 30 days | Status: CP
Start: 2019-12-22 — End: 2020-12-21
  Filled 2019-12-23: qty 60, 30d supply, fill #0

## 2019-12-22 MED ORDER — CALCIUM CARBONATE 200 MG CALCIUM (500 MG) CHEWABLE TABLET
ORAL_TABLET | Freq: Three times a day (TID) | ORAL | 0 refills | 17.00000 days | Status: CP
Start: 2019-12-22 — End: 2020-01-21
  Filled 2019-12-23: qty 150, 17d supply, fill #0

## 2019-12-22 NOTE — Unmapped (Signed)
Pediatric Palliative Care Consult Note      Primary Care Provider: [name], MD    History provided by: Patient and mother    Assessment and Summary of Discussion:   [name] Gross is a 19 y.o. 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  [name] Gross is followed by [facility] hepatology teams as an outpatient and was being worked up for possible repeat liver transplant; now on hold due to acute illness, overall decline in health, and need for hospitalization.  Palliative care consult requested for family support.    [name] Gross is looking forward to going home. Mother is hopeful for him to make progress with weight gain and PT and that he will remain a candidate for liver transplant but understands that he is not currently an active candidate. She acknowledges that they all need increased support and is grateful to have hospice in place and also a good mental health follow up plan for [name] Gross.     Recommendations:   1. SYMPTOMS:     - Pain/discomfort: [name] Gross reports some back pain which he says is no worse than baseline. Legs/feet are uncomfortable due to edema but not painful  ?? Acetaminophen can be used sparingly for MSK pain  ?? He has also tried diclofenac gel and Ben-gay as an outpatient  ?? PT for stretching/strengthening    - Anorexia: S/p EGD on on 2/2 w/ candidal esophagitis, no varices - treating with fluconazole (100 mg daily) and pantoprazole (40 mg BID).  Given [name] Gross' overall poor nutritional status; weight loss and decreased PO intake, NG feeds were initiated.  [name] Gross worked with his team to decide to remove his NG tube once his emesis and nausea improved as he found it very uncomfortable and the continuous feeds made it difficult for him to have enough of an appetite to eat things he enjoys  ?? [name] Gross has received education about high calorie options for nutrition. Mother is hopeful for frequent follow up with dietitian after d/c  ?? Remeron may be helpful in stimulating appetite    - Mood: H/o major depressive disorder.  [name] Gross is polite and willing to engage, but he continues to appear withdrawn much of the time.  He has described himself as feeling taxed and overexerted and refuses many aspects of his care at times because of this.     ?? Psychiatry and Psychology following, working with Child Life  ?? Started Remeron during this admission which he didn't identify as being for mood, but rather just for sleep and appetite. He expressed some positive feelings about starting a medication for mood.     - Sleep disturbance: Difficulty sleeping reported; mostly attributed to interruptions at night.  [name] Gross reported he tries to take a nap(s) during the day to make up for lost sleep at night.   ?? Recommend clustering care to allow  for periods of uninterrupted sleep when able to do so safely.  ?? Agree with melatonin, 3 mg every evening to promote sleep.  ?? Agree with hydroxyzine, 25 mg q6h PRN anxiety/sleep.  ?? Now taking Remeron as well    - Fatigue and deconditioning: Related to severe liver disease, malnutrition, depression. He feels that PT has been beneficial outpatient when he's feeling better and knows he needs it inpatient  2. GOALS:   - Mother is concerned about [name] Gross' mental health and appreciates psychiatry's continued involvement.  - [name] Gross has long term goals which include going back to school and becoming a Runner, broadcasting/film/video or nurse. He seems less focused on these goals as his condition declines  - Encouraged continued work on goals of care outpatient, both with his GI team and his hospice team  - [name] Gross reports that there is a plan for him to transition to the adult hepatology team and he is hopeful to establish good relationships with new team members. He is familiar with the nurse practitioner with the team which is helpful, and of course knows the liver transplant team well.     3. DECISIONS:   - [name] Gross is now facing medical decisions regarding future care, including repeat transplant if he is a candidate vs prioritizing quality of life and focusing on comfort. He is feeling torn about what he wants and feels pressured by family and medical providers (for good reasons - they care deeply about him - but it's pressure nonetheless)  - We have discussed roles of family members in supporting [name] Gross in decision making. Mother continues to expresse her wish that he was a minor so she could take this on for him as she's finding it difficult to watch him struggle to make decisions. Discussed pressures [name] Gross may feel around decision-making as a young person and a member of a family, reiterating that he is fortunate to have support from loving family members. Mother has shared that she will support him if he chooses not to pursue transplant but she's finding it very difficult to cope with how much he's suffering and how much of his normal childhood he's lost to his disease and just wants to be able to give him another chance. She notes that the uncertainty of transplant and how much sicker he is this time than he was when he had his first transplant which is worrisome to her.    4. ADVANCE CARE PLANNING  - Code status: Full code.  - Health care power of attorney designated by [employer] during a previous admission include friend and paternal grandfather - will need to be verify that this is still his preference now that his living situation and supports have changed  - Prognosis: Guarded given severity of liver disease and potential for lack of response to salvage therapies, recurrent AKI on CKD, maintaining weight, continued decline despite much effort to maintain health    5. SUPPORT:   - [name] Gross has expanded his support network by reconnecting with many family members who have expressed their desire to support him and assist with his care.  He is currently living with his mother who is working to balance her role as mother with Kreed' right to autonomy and the right to direct his own care.  - Hospice (concurrent care) through [facility]. Will update team prior to d/c.   - Followed by Psychology, Psychiatry, Child Life  - Encouraged mother to work on self care - she has access to a PCP and to mental health services as well as support from hospice.     6. CARE COORDINATION:   - Care coordinated with peds GI team  - Supportive care team available for team / family meetings as needed.    Total time spent with patient for evaluation & management (excluding ACP documented separately): 40 minutes  Greater than 50% of this time spent on counseling/coordination of care: Yes.  See ACP Note from  today for additional billable service: No.      We appreciate the consult. The Children's Supportive Care Team can be reached by pager [phone] [name] Gross) or email [email or web address]).       History of Present Illness:   Taji is a 19 y.o. 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  Etiology of coffee ground emesis thought to be secondary to esophageal varices in context of longstanding portal hypertension.  Morey was being evaluated for repeat liver transplant prior to this hospitalization.     Interval History:  Donevan is excited about going home soon - he thinks tomorrow. Wants to take a real shower and hopes that there will be some food he likes ready for him. Shares that he is hopeful that starting Remeron will help his appetite and allow him to sleep better at night. He expressed not really understanding that it can also help his mood but likes the idea of this. Hopes most of his follow up visits will be virtual because it's exhausting to come to [facility] for clinic, but expects to come in person for hepatology clinic.     Mother is anxious about d/c but hopeful for a smooth transition home and a good follow up plan. Notes she is worried about juggling all of her responsibilities and is grateful for support from Abir's grandmother with whom he will stay for part of each week. She recounted meeting with the transplant surgeon on rounds and said he was very direct about Gaylord not being a candidate for transplant right now. She expected this and appreciates the communication but says it's hard to hear.     See above for more details of visit    Allergies:  Bee pollen and Pollen extracts    Medications:  Scheduled Meds:  ??? benzoyl peroxide   Topical Nightly   ??? calcium carbonate  600 mg of elem calcium Oral TID   ??? clindamycin   Topical Nightly   ??? ergocalciferol  50,000 Units Oral Weekly   ??? fluconazole  200 mg Enteral tube: gastric  Daily   ??? furosemide  40 mg Oral Daily   ??? insulin regular  0-6 Units Subcutaneous Q6H [facility]   ??? magnesium oxide  400 mg Enteral tube: gastric  BID   ??? melatonin  3 mg Enteral tube: gastric  QPM   ??? mirtazapine  7.5 mg Oral Nightly   ??? mycophenolate  540 mg Oral BID   ??? pantoprazole  40 mg Oral BID   ??? MVW Complete (pediatric multivit 61-D3-vit K)  1 capsule Oral Daily   ??? potassium chloride  40 mEq Enteral tube: gastric  Daily   ??? potassium phosphate (monobasic)  1,000 mg Oral BID   ??? predniSONE  20 mg Enteral tube: gastric  Daily   ??? rifAXIMin  550 mg Oral BID   ??? spironolactone  100 mg Enteral tube: gastric  Daily   ??? tacrolimus  0.5 mg Oral BID   ??? thiamine  100 mg Enteral tube: gastric  Daily   ??? valGANciclovir  450 mg Enteral tube: gastric  Q24H     Continuous Infusions:    PRN Meds:.dextrose 50 % in water (D50W), hydroxyzine, ondansetron, white petrolatum-mineral oil-lanolin topical  Physical Exam:   Temp:  [36.8 ??C (98.3 ??F)-37 ??C (98.6 ??F)] 36.8 ??C (98.3 ??F)  Heart Rate:  [84-90] 90  SpO2 Pulse:  [85] 85  Resp:  [18-20] 20  SpO2:  [98 %-100 %] 98 %  BP: (101-141)/(53-73) 141/73  MAP (mmHg):  [69-93] 93     Gen: thin, frail adolescent male, sitting in a chair eating breakfast, soft-spoken, polite, flat affect  HEENT: sclera icteric, Mower/AT.  Resp: normal WOB on RA  ABD: distended.  Extrem: BLE edema stable from yesterday.  Neuro: awake/alert, oriented X 3, appropriate, clear speech, +tremors of hands/UE's.    Data: Reviewed in Epic.        Therese Sarah, MD, MPH  Attending Physician, Children's Supportive Care Team

## 2019-12-22 NOTE — Unmapped (Signed)
Pediatric Tacrolimus Therapeutic Monitoring Pharmacy Note  ??  [name] Gross is a 19 y.o. male continuing tacrolimus.   ??  Indication: Liver transplant   ??  Date of Transplant: 09/22/2016      Prior Dosing Information: home regimen: 1 mg (0.02 mg/kg) PO every 12 hours     ??  Dosing Weight: 52.9 kg  ??  Goals:  Therapeutic Drug Levels  Tacrolimus trough goal: 8-10 ng/mL  ??  Additional Clinical Monitoring/Outcomes  ?? Monitor renal function (SCr and urine output) and liver function (LFTs)  ?? Monitor for signs/symptoms of adverse events (e.g., hyperglycemia, hyperkalemia, hypomagnesemia, hypertension, headache, tremor)  ??  Results:   Tacrolimus level: 2.3 ng/mL drawn after 5 doses (steady state)  ??  ??  Longitudinal Dose Monitoring:  Date Dose (mg), Route Level  (ng/mL) AM Scr (mg/dL) Key Drug Interactions   12/22/19 0.5 mg/ 2.3 1.2 Fluconazole   12/21/19 0.5 mg/ 0.5 mg --- --- Fluconazole   12/20/19 0.5 mg/ 0.5 mg --- 1.31 Fluconazole   12/19/19 HOLD/0.5 mg  --- 1.39 Fluconazole   12/14/19 NONE 2.8 2.4 Fluconazole   ??  Pharmacokinetic Considerations and Significant Drug Interactions:  ?? Concurrent hepatotoxic medications: fluconazole  ?? Concurrent CYP3A4 substrates/inhibitors: fluconazole  ?? Concurrent nephrotoxic medications: fluconazole (renally dose adjusted), spironolactone, valganciclovir ppx (renally dose adjusted)  ??  Assessment/Plan:  Recommendedation(s)  Tacrolimus 0.5 mg PO q12h was restarted at 2000 on 12/19/19. This was a reduced dose from home, due to his lingering AKI and 200 mg of fluconazole daily regimen. As per GI fellow Dr [name] Gross, we will opt not to increase dose at this time. [name] Gross has an appointment with Dr [name] Gross next week and they will draw another level at that time..     ??  Follow-up  ?? Next level will be in clinic next week  ?? A pharmacist will continue to monitor and recommend levels as appropriate  ??  Please page service pharmacist with questions/clarifications.  ??  [name] Gross, PharmD  Pediatric Clinical Pharmacist   [phone] (satellite)

## 2019-12-22 NOTE — Unmapped (Signed)
Jeramie' VSS, afebrile overnight. Remains on RA. No complaint of pain overnight. R. FA PIV c/d/I, saline locked overnight. Took all meds as scheduled, but did refuse both POC glucose checks overnight. Patient aware of needed labs this AM at 0800. Good PO intake throughout night and UOP. Mom at bedside, very active in cares. Will continue to monitor.   Problem: Adult Inpatient Plan of Care  Goal: Plan of Care Review  Outcome: Ongoing - Unchanged     Problem: Adult Inpatient Plan of Care  Goal: Patient-Specific Goal (Individualization)  Outcome: Ongoing - Unchanged     Problem: Adult Inpatient Plan of Care  Goal: Absence of Hospital-Acquired Illness or Injury  Outcome: Ongoing - Unchanged     Problem: Adult Inpatient Plan of Care  Goal: Optimal Comfort and Wellbeing  Outcome: Ongoing - Unchanged

## 2019-12-23 DIAGNOSIS — Z944 Liver transplant status: Principal | ICD-10-CM

## 2019-12-23 LAB — VITAMIN D 1,25-DIHYDROXY: 1,25-Dihydroxyvitamin D:MCnc:Pt:Ser/Plas:Qn:: <8 — ABNORMAL LOW

## 2019-12-23 MED ORDER — POTASSIUM CHLORIDE 20 MEQ PO PACK
40.00 | PACK | ORAL | Status: DC
Start: 2019-12-24 — End: 2019-12-23

## 2019-12-23 MED ORDER — THIAMINE HCL 100 MG PO TABS
100.00 | ORAL_TABLET | ORAL | Status: DC
Start: 2019-12-24 — End: 2019-12-23

## 2019-12-23 MED ORDER — CLINDAMYCIN PHOSPHATE 1 % EX SOLN
CUTANEOUS | Status: DC
Start: 2019-12-23 — End: 2019-12-23

## 2019-12-23 MED ORDER — MELATONIN 3 MG PO TABS
3.00 | ORAL_TABLET | ORAL | Status: DC
Start: 2019-12-23 — End: 2019-12-23

## 2019-12-23 MED ORDER — SPIRONOLACTONE 25 MG PO TABS
100.00 | ORAL_TABLET | ORAL | Status: DC
Start: 2019-12-24 — End: 2019-12-23

## 2019-12-23 MED ORDER — MAGNESIUM OXIDE 400 MG PO TABS
400.00 | ORAL_TABLET | ORAL | Status: DC
Start: 2019-12-23 — End: 2019-12-23

## 2019-12-23 MED ORDER — DERMACERIN EX CREA
1.00 | TOPICAL_CREAM | CUTANEOUS | Status: DC
Start: ? — End: 2019-12-23

## 2019-12-23 MED ORDER — FUROSEMIDE 20 MG PO TABS
40.00 | ORAL_TABLET | ORAL | Status: DC
Start: 2019-12-24 — End: 2019-12-23

## 2019-12-23 MED ORDER — DEXTROSE 50 % IV SOLN
12.50 | INTRAVENOUS | Status: DC
Start: ? — End: 2019-12-23

## 2019-12-23 MED ORDER — POTASSIUM PHOSPHATE MONOBASIC 500 MG PO TABS
1000.00 | ORAL_TABLET | ORAL | Status: DC
Start: 2019-12-24 — End: 2019-12-23

## 2019-12-23 MED ORDER — BENZOYL PEROXIDE 5 % EX GEL
CUTANEOUS | Status: DC
Start: 2019-12-23 — End: 2019-12-23

## 2019-12-23 MED ORDER — INSULIN REGULAR HUMAN 100 UNIT/ML IJ SOLN
0.00 | INTRAMUSCULAR | Status: DC
Start: 2019-12-23 — End: 2019-12-23

## 2019-12-23 MED ORDER — MIRTAZAPINE 15 MG PO TABS
7.50 | ORAL_TABLET | ORAL | Status: DC
Start: 2019-12-23 — End: 2019-12-23

## 2019-12-23 MED ORDER — DIPHENHYDRAMINE HCL 25 MG PO CAPS
25.00 | ORAL_CAPSULE | ORAL | Status: DC
Start: ? — End: 2019-12-23

## 2019-12-23 MED ORDER — ONDANSETRON HCL 4 MG/2ML IJ SOLN
4.00 | INTRAMUSCULAR | Status: DC
Start: ? — End: 2019-12-23

## 2019-12-23 MED ORDER — ERGOCALCIFEROL 1.25 MG (50000 UT) PO CAPS
50000.00 | ORAL_CAPSULE | ORAL | Status: DC
Start: 2019-12-29 — End: 2019-12-23

## 2019-12-23 MED ORDER — TACROLIMUS 0.5 MG PO CAPS
0.50 | ORAL_CAPSULE | ORAL | Status: DC
Start: 2019-12-23 — End: 2019-12-23

## 2019-12-23 MED ORDER — PREDNISONE 20 MG PO TABS
20.00 | ORAL_TABLET | ORAL | Status: DC
Start: 2019-12-24 — End: 2019-12-23

## 2019-12-23 MED ORDER — GENERIC EXTERNAL MEDICATION
540.00 | Status: DC
Start: 2019-12-23 — End: 2019-12-23

## 2019-12-23 MED ORDER — PANTOPRAZOLE SODIUM 40 MG PO TBEC
40.00 | DELAYED_RELEASE_TABLET | ORAL | Status: DC
Start: 2019-12-23 — End: 2019-12-23

## 2019-12-23 MED ORDER — FLUCONAZOLE 200 MG PO TABS
200.00 | ORAL_TABLET | ORAL | Status: DC
Start: 2019-12-24 — End: 2019-12-23

## 2019-12-23 MED ORDER — MVW COMPLETE FORMULATION PO CAPS
1.00 | ORAL_CAPSULE | ORAL | Status: DC
Start: 2019-12-24 — End: 2019-12-23

## 2019-12-23 MED ORDER — CALCIUM CARBONATE ANTACID 500 MG PO CHEW
CHEWABLE_TABLET | ORAL | Status: DC
Start: 2019-12-23 — End: 2019-12-23

## 2019-12-23 MED ORDER — RIFAXIMIN 550 MG PO TABS
550.00 | ORAL_TABLET | ORAL | Status: DC
Start: 2019-12-23 — End: 2019-12-23

## 2019-12-23 MED ORDER — VALGANCICLOVIR HCL 450 MG PO TABS
450.00 | ORAL_TABLET | ORAL | Status: DC
Start: 2019-12-24 — End: 2019-12-23

## 2019-12-23 MED ORDER — POTASSIUM CHLORIDE 20 MEQ ORAL PACKET
PACK | Freq: Every day | ORAL | 0 refills | 30.00000 days | Status: CP
Start: 2019-12-23 — End: 2020-01-22
  Filled 2019-12-23: qty 30, 15d supply, fill #0

## 2019-12-23 MED FILL — ANTACID (CALCIUM CARBONATE) 200 MG CALCIUM (500 MG) CHEWABLE TABLET: 17 days supply | Qty: 150 | Fill #0 | Status: AC

## 2019-12-23 MED FILL — MIRTAZAPINE 7.5 MG TABLET: 30 days supply | Qty: 30 | Fill #0 | Status: AC

## 2019-12-23 MED FILL — ERGOCALCIFEROL (VITAMIN D2) 1,250 MCG (50,000 UNIT) CAPSULE: ORAL | 21 days supply | Qty: 3 | Fill #0

## 2019-12-23 MED FILL — FLUCONAZOLE 200 MG TABLET: 3 days supply | Qty: 3 | Fill #0 | Status: AC

## 2019-12-23 MED FILL — VITAMIN B-1 100 MG TABLET: 100 days supply | Qty: 100 | Fill #0 | Status: AC

## 2019-12-23 MED FILL — HYDROXYZINE HCL 25 MG TABLET: 7 days supply | Qty: 30 | Fill #0 | Status: AC

## 2019-12-23 MED FILL — ERGOCALCIFEROL (VITAMIN D2) 1,250 MCG (50,000 UNIT) CAPSULE: 21 days supply | Qty: 3 | Fill #0 | Status: AC

## 2019-12-23 MED FILL — PREDNISONE 20 MG TABLET: 30 days supply | Qty: 30 | Fill #0 | Status: AC

## 2019-12-23 MED FILL — SPIRONOLACTONE 100 MG TABLET: 30 days supply | Qty: 30 | Fill #0 | Status: AC

## 2019-12-23 MED FILL — POTASSIUM CHLORIDE 20 MEQ ORAL PACKET: 15 days supply | Qty: 30 | Fill #0 | Status: AC

## 2019-12-23 MED FILL — TACROLIMUS 0.5 MG CAPSULE: 30 days supply | Qty: 60 | Fill #0 | Status: AC

## 2019-12-23 MED FILL — FUROSEMIDE 20 MG TABLET: 30 days supply | Qty: 15 | Fill #0 | Status: AC

## 2019-12-23 MED FILL — K-PHOS ORIGINAL 500 MG SOLUBLE TABLET: 30 days supply | Qty: 120 | Fill #0 | Status: AC

## 2019-12-23 NOTE — Unmapped (Addendum)
Pt's noon BP slightly elevated. Refused 1600 vitals. MD aware. All other VSS. PIV c/d/I SL'd, tolerated albumin infusion today. Lasix given per MD orders. AM labs showed elevated potassium and lowered mag levels. Pt not placed on Monitors per MD orders. ECG done at bedside. Pt's bilateral pedal pulses difficult to palpate with +3 edema. Able to use doppler and hear pulses. MD notified. Experienced dyspnea with exertion and would begin to cough with movement. Tremors noted throughout body. Continued with flat affect. UO appropriate, had several loose Bm's. Both counted as occurrences. Refused blood glucose cheeks today. Took all scheduled medications well today. Mom at bedside most of day and active in care. WCTM.       Problem: Adult Inpatient Plan of Care  Goal: Plan of Care Review  Outcome: Ongoing - Unchanged  Goal: Patient-Specific Goal (Individualization)  Outcome: Ongoing - Unchanged  Goal: Absence of Hospital-Acquired Illness or Injury  Outcome: Ongoing - Unchanged  Goal: Optimal Comfort and Wellbeing  Outcome: Ongoing - Unchanged  Goal: Readiness for Transition of Care  Outcome: Ongoing - Unchanged  Goal: Rounds/Family Conference  Outcome: Ongoing - Unchanged     Problem: Skin Injury Risk Increased  Goal: Skin Health and Integrity  Outcome: Ongoing - Unchanged     Problem: Self-Care Deficit  Goal: Improved Ability to Complete Activities of Daily Living  Outcome: Ongoing - Unchanged     Problem: Fall Injury Risk  Goal: Absence of Fall and Fall-Related Injury  Outcome: Ongoing - Unchanged     Problem: Infection  Goal: Infection Symptom Resolution  Outcome: Ongoing - Unchanged

## 2019-12-23 NOTE — Unmapped (Signed)
Devondre has been afebrile with VSS overnight. Overall had a very good and uneventful night. Taking PO medications well. Refusing glucose checks and insulin. He has had a good appetite and has been reporting urine output. Pitting edema in bilateral feet. Generalized non-pitting edema in BUE and face. He has had no c/o pain or nausea. His grandmother is at bedside. Pt and grandmother updated on POC. Plan: continue to monitor.

## 2019-12-23 NOTE — Unmapped (Signed)
VSS and afebrile on RA. PIV removed before discharge. Tolerated scheduled medications well. Continued to have dyspnea with exertion. Medications delivered to bedside via [place] care. Psych consult came by before discharge. Discharge instructions provided to grandma and patient including medication management, follow up appointments, when to call the MD, important phone numbers, and s/s to look for. All questions answered at this time. Left unit with grandma via discharge chart. No further questions at this time.       Problem: Adult Inpatient Plan of Care  Goal: Plan of Care Review  Outcome: Discharged to Home  Goal: Patient-Specific Goal (Individualization)  Outcome: Discharged to Home  Goal: Absence of Hospital-Acquired Illness or Injury  Outcome: Discharged to Home  Goal: Optimal Comfort and Wellbeing  Outcome: Discharged to Home  Goal: Readiness for Transition of Care  Outcome: Discharged to Home  Goal: Rounds/Family Conference  Outcome: Discharged to Home     Problem: Skin Injury Risk Increased  Goal: Skin Health and Integrity  Outcome: Discharged to Home     Problem: Self-Care Deficit  Goal: Improved Ability to Complete Activities of Daily Living  Outcome: Discharged to Home     Problem: Fall Injury Risk  Goal: Absence of Fall and Fall-Related Injury  Outcome: Discharged to Home     Problem: Infection  Goal: Infection Symptom Resolution  Outcome: Discharged to Home

## 2019-12-23 NOTE — Unmapped (Signed)
[facility]  Follow-Up Psychiatry Consult Note     Service Date: December 23, 2019  LOS:  LOS: 12 days      Assessment:   [name] Gross is a 19 y.o. 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 hepatorenal syndrome (HRS) with possible GI bleed from known esophageal varices..  Patient was seen in consultation by Psychiatry at the request of [name], MD with [facility]) for evaluation of Depression and pt was diagnosed with historical diagnosis of MDD. Today, we are seeing pt to follow mood, sleep and mental status.     The patient's current presentation of persistent depressed mood, poor sleep, and poor appetite, is most consistent with known diagnosis of Major Depressive Disorder.  Remeron started earlier this week with no notable effects per patient but possible improved appetite on RN report.  Patient and family open to continuing this medication given prior success and current stability.  Declined dose increase but would discuss at upcoming psychiatry outpatient appointment in our clinic.    No changes in formal recommendations at this time.  Please see below for details.    Diagnoses:   Active Hospital problems:  Principal Problem:    AKI (acute kidney injury) (CMS-HCC)  Active Problems:    Malnutrition (CMS-HCC)    History of liver transplant (CMS-HCC)    Major depressive disorder    Transaminitis Liver transplant rejection (CMS-HCC)    Normocytic anemia    Hypoalbuminemia    Steroid-induced hyperglycemia    Thrombocytopenia (CMS-HCC)    Ascites of liver    Hyperbilirubinemia    ATN (acute tubular necrosis) (CMS-HCC)    Pleural effusion    Hypokalemia    Rectal bleeding    Coagulopathy (CMS-HCC)    Candidal esophagitis (CMS-HCC)    Fibrinogen decreased (CMS-HCC)    Vitamin D deficiency       Problems edited/added by me:  No problems updated.    Safety Risk Assessment:  A full risk assessment was previously performed on 2/1.  Risk assessment remains essentially unchanged. Patient remains not at acutely elevated risk.       Recommendations:   ## Safety:   -- No acute safety concerns.  Please see safety assessment for further discussion.    ## Medications:   -- Continue Remeron 7.5 mg qhs  -- Continue hydroxyzine 25 mg PO q6h PRN anxiety, sleep  -- Continue Thiamine PO/NGT 100 mg daily.  -- Continue Folate supplementation and multivitamin daily.  -- Continue Melatonin 3 mg q1800 for sleep and circadian rhythm regulation.   -- Agree with vitamin D supplementation    ## Medical Decision Making Capacity:   -- A formal capacity assessement was not performed as a part of this evaluation.  If specific capacity questions arise, please contact our team as below.     ## Further Work-up:   -- EKG (12/22/19): QTc 430-407 (Babetz vs Fredericia, HR 87)  -- While the patient is receiving medications (  such as hydroxyzine) that may prolong QTc and increase risk for torsades:     - MONITOR and KEEP Mg>2 and K>4      - MONITOR QTc regularly.  If QTc on tele strip >479ms, obtain 12-lead EKG.    ## Disposition:   -- There are no psychiatric contraindications to discharging this patient when medically appropriate.  -- The patient will follow-up with [facility] 3/2 @ 10:30 am with Dr. Jonette Pesa for mental health care at the time of discharge.     ## Behavioral / Environmental:   -- Please continue Delirium (prevention) protocol detailed in initial consult note.    Thank you for this consult request. Recommendations have been communicated to the primary team.  We will sign off at this time. Please page  [phone] or [phone] (after hours)  for any questions or concerns.     This patient was evaluated in person.    I performed all aspects of this patient interaction.    Elnita Maxwell, MD      Interval History:     Updates to hospital course since last seen by psychiatry: Started on Remeron.  Plan for discharge home with home health today.  Nursing notes indicate improved appetite.      Patient Interview:    Patient seated in hospital bed, appeared fatigued but alert and oriented.  Endorses feeling fine with no appreciable change in sleep, appetite, mood, or anxiety since starting Remeron.  Denies notable side effects and wished to continue current dosing for now.  Believes this medication was helpful in the past.  Denies confusion / disorientation and was able to complete MOYB without error and only occasional pauses despite annoyance with testing and interrupting coughing spell.  Denies any recent thoughts of transitioning to primarily comfort-focused care and is hopeful that physical symptoms and functionality will improve.  Denies SI/HI.       ROS:   All systems reviewed as negative/unremarkable aside from the following pertinent positives and negatives: +cough    Collateral:   - Reviewed medical records in Epic   - Spoke with patient's grandmother who denied concerns.  Pt will be staying with her for a few days before moving home with mother (her daughter).    - Spoke with RN: Confirmed psychiatry outpatient follow-up per discharge summary.    Relevant Updates to past psychiatric, medical/surgical, family, or social history: reviewed and no new updates.    Current Medications:  Scheduled Meds:  ??? benzoyl peroxide   Topical Nightly   ??? calcium carbonate  600 mg of elem calcium Oral TID   ??? clindamycin   Topical Nightly   ??? ergocalciferol  50,000 Units Oral Weekly   ??? fluconazole  200 mg Enteral tube: gastric  Daily   ??? furosemide  40 mg Oral Daily   ??? insulin regular  0-6 Units Subcutaneous Q6H [facility]   ??? magnesium oxide  400 mg Enteral tube: gastric  BID   ??? melatonin  3 mg Enteral tube: gastric  QPM   ??? mirtazapine  7.5 mg Oral Nightly   ??? mycophenolate  540 mg Oral BID   ??? pantoprazole  40 mg Oral BID   ??? MVW Complete (pediatric multivit 61-D3-vit K)  1 capsule Oral Daily   ??? potassium chloride  40 mEq Enteral tube: gastric  Daily   ??? potassium phosphate (monobasic)  1,000 mg Oral BID   ??? predniSONE  20 mg Enteral tube: gastric  Daily   ???  rifAXIMin  550 mg Oral BID   ??? spironolactone  100 mg Enteral tube: gastric  Daily   ??? tacrolimus  0.5 mg Oral BID   ??? thiamine  100 mg Enteral tube: gastric  Daily   ??? valGANciclovir  450 mg Enteral tube: gastric  Q24H     Continuous Infusions:    PRN Meds:.dextrose 50 % in water (D50W), hydroxyzine, ondansetron, white petrolatum-mineral oil-lanolin topical      Objective:   Vital signs:   Temp:  [36.7 ??C (98.1 ??F)-36.9 ??C (98.4 ??F)] 36.7 ??C (98.1 ??F)  Heart Rate:  [87-92] 92  Resp:  [18-20] 20  BP: (102-109)/(52-70) 109/70  MAP (mmHg):  [67-82] 82  SpO2:  [100 %] 100 %    Physical Exam:  Gen: No acute distress.  Pulm: Normal work of breathing.  Neuro/MSK: Normal bulk/tone. Gait/station deferred given acutely ill.  Skin: jaundiced skin and eyes     Mental Status Exam:  Appearance:  appears stated age and ill-appearing. Scleral icterus   Attitude:   calm and cooperative   Behavior/Psychomotor:  appropriate eye contact and tremors of the upper extremities   Speech/Language:   normal rate, volume, tone, fluency   Mood:  fine   Affect:  constricted, dysthymic   Thought process:  logical, linear, clear, coherent, goal directed   Thought content:    denies thoughts of self-harm. Denies SI, plans, or intent. Denies HI.  No grandiose, self-referential, persecutory, or paranoid delusions noted. Perceptual disturbances:   does not endorse auditory and visual hallucinations and behavior not concerning for response to internal stimuli   Attention:  able to fully attend without fluctuations in consciousness and on months of the year backwards no errors but got stuck on 2 occasions   Concentration:  Able to fully concentrate and attend   Orientation:  Oriented to person, place, city, date and situation.   Memory:  not formally tested, but grossly intact   Fund of knowledge:   not formally assessed   Insight:    Impaired   Judgment:   Limited   Impulse Control:  Limited       Data Reviewed:  I reviewed labs from the last 24 hours.     Additional Psychometric Testing:  Not applicable.

## 2019-12-23 NOTE — Unmapped (Signed)
Pediatric Hospital Medicine (PHM) Discharge Summary    Patient Information:   [name] Gross  Date of Birth: [birth year]    Admission/Discharge Information:     Admit Date: 12/11/2019 Admitting Attending: Morrison Old, MD   Discharge Date: 12/23/2019 Discharge Attending: Ree Shay, MD   Length of Stay: 12 days Discharge Service: Ped Gastroenterology [facility])     Disposition: Home  **Condition at Discharge:   Improved    Final Diagnoses:   Principal Problem (Resolved):    AKI (acute kidney injury) (CMS-HCC)  Active Problems:    Malnutrition (CMS-HCC)    History of liver transplant (CMS-HCC)    Major depressive disorder    Transaminitis    Liver transplant rejection (CMS-HCC)    Normocytic anemia    Hypoalbuminemia    Steroid-induced hyperglycemia    Thrombocytopenia (CMS-HCC)    Ascites of liver    Hyperbilirubinemia    ATN (acute tubular necrosis) (CMS-HCC)    Pleural effusion    Hypokalemia    Rectal bleeding    Coagulopathy (CMS-HCC)    Candidal esophagitis (CMS-HCC)    Fibrinogen decreased (CMS-HCC)    Vitamin D deficiency      Reason(s) for Hospitalization:     1. Candidal Esophagitis   2. Acute Kidney Injury  3. Anasarca   4. Bilateral Pleural Effusions     Pertinent Results/Procedures Performed:   Last Weight: Weight: 56.7 kg (125 lb)    Pertinent Lab Results:  Lab Results   Component Value Date    WBC 12.3 (H) 12/17/2019    HGB 7.8 (L) 12/17/2019    HCT 24.5 (L) 12/17/2019    PLT 135 (L) 12/17/2019       Lab Results   Component Value Date    NA 139 12/22/2019    K 5.5 (H) 12/22/2019    CL 114 (H) 12/22/2019    CO2  12/22/2019      Comment:      Specimen icteric.      BUN 38 (H) 12/22/2019    CREATININE 1.20 12/22/2019    GLU 192 (H) 12/22/2019    CALCIUM 9.3 12/22/2019    MG 1.4 (L) 12/22/2019    PHOS 3.7 12/22/2019       Lab Results   Component Value Date    BILITOT 36.3 (H) 12/17/2019    BILIDIR 28.30 (H) 12/12/2019    PROT  12/17/2019      Comment:      Specimen icteric.      ALBUMIN 2.9 (L) 12/22/2019    ALT  12/17/2019      Comment:      Specimen icteric.      AST 79 (H) 12/17/2019    ALKPHOS 667 (H) 12/17/2019    GGT 256 (H) 12/11/2019       Lab Results   Component Value Date    PT 16.8 (H) 12/22/2019    INR 1.44 12/22/2019    APTT 37.8 (H) 12/15/2019       Imaging Results:   Echocardiogram Pediatric  Noncongenital Complete   Final Result      XR Chest Portable   Final Result         -- Interval improvement in right lung aeration with decreased bibasilar atelectasis      --Interval decrease in small bilateral pleural effusions.         XR Chest 1 view   Final Result   1. New, large, right greater than left pleural effusions with  adjacent atelectasis or airspace disease.   2. Interstitial edema.   3. New enlargement of the cardiac silhouette.      XR Abdomen 1 View   Final Result   Nonspecific bowel gas pattern without definite evidence of obstruction.      Interval advancement of the weighted enteric tube with tip projecting over the gastric body.      Increasing bilateral pleural effusions with accompanying atelectasis.      PVL Venous Duplex Upper Extremity Right   Final Result      US Renal Complete   Final Result   Poorly visualized right kidney on current exam. No hydronephrosis. Echogenic renal parenchyma similar to prior which can be seen with medical renal disease.               XR Abdomen Portable   Final Result   -Weighted enteric tube with tip now in the gastric body. If ultimate goal is placement of weighted enteric tube in the small bowel, then would recommend advancing 10 cm.      XR Abdomen 1 View   Final Result   -Weighted enteric tube projects over the mid esophagus. Recommend replacing weighted enteric tube.      The findings of this study were discussed via telephone with [name] Gross by Dr. Lucia Gross on 12/13/2019 5:45 PM.      US Liver Transplant   Final Result   - Patent hepatic transplant vasculature.   - Interval increase in resistive indices noted throughout the hepatic arteries which are within upper limits of normal/minimally elevated, as above.   - Increased diameter common bile duct. No stone identified.   - Heterogeneous liver echotexture, which is somewhat nonspecific, however, can be seen in setting of underlying liver disease. Slight interval decrease in liver measurement with increased size spleen.   - Large-volume abdominopelvic ascites, similar to prior.   - Bilateral echogenic kidneys, nonspecific, however, can be seen with chronic medical renal disease.            XR Chest Portable   Final Result   Elevated left hemidiaphragm.   Question catheter versus artifact overlying the right axilla.        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 Pt was admitted to the PICU on 12/11/2019.     Please see below for a complete hospital course based on systems:    Resp: Pt with hx of asthma. He was SORA upon admission. Was continued on home flovent 1 puff BID and albuterol PRN. In the setting of his AKI, hypoalbuminemia patient developed bilateral pleural effusions present on CXR. Physical exam revealed coarse breath sounds but no crackles. He did not require any supplemental oxygen throughout admission. Patient's tachypnea improved throughout course of admission and was provided with albumin to assist with removal of excess fluid. Patient was discharged with 10mg  lasix daily.   ??  CV: Pt with hypotension (60/40's) prior to transfer likely 2/2 to dehydration. Received multiple LR boluses, albumin and 1 unit pRBC fluid resuscitation with minimal improvement in BP's upon admission. BP did however improve after stress dose steroids added. Steroids were transitioned back to home regimen on 2/3 after significant hemodynamic improvement. Upon transfer to  GI service, patient remained hemodynamically stable and patient was restarted on prednisone 20mg .   ??  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 CBC notable for Hgb 7.7 upon admission (down from Hgb 9 at OSH), thought likely to be dilutional given aggressive fluid resuscitation prior to transfer. Patient HGB stabilized upon transfer to GI service.     ID: Pt afebrile but with WBC of 15 upon admission. Blood cultures obtained at OSH on 1/30 prior to transfer. Upon admission he was started on Flagyl, Cefepime and Vancomycin. He was continued on home valganciclovir but home Bactrim ppx (usually M-W-F) was initially held due to AKI. Peritoneal cultures were obtained due to concern for SBP, which showed no growth of bacteria. Patient was found to have candidal esophagitis on EGD on 2/2 and was started on 14 day course of fluconazole. Patient remained afebrile throughout remainder of admission.   ????  FEN/GI: AKI on admission with Cr 2.7, likely prerenal in the setting of dehydration. Received 60cc/kg IVF resuscitation prior to transfer. He was made NPO and started on mIVFs of LR while in the PICU. Was started on IV Protonix 40mg  BID for concern for coffee ground emesis. Was continued on home aquadeks, mag-ox, vit D3. Patient had NG tube placed for enteral feeds due to inability to have adequate intake in order to meet caloric needs in the setting of his chronic malnutrition. Family meeting was held on 12/19/19 to discuss goals of care moving forward as to whether Demitrius wanted to move towards comfort care versus preparation for possible second liver transplant. During this meeting, Tareq' biggest concern was the removal of his NG tube which would make his ability to remain a candidate for liver transplant very challenging. The benefits and risks of removing his NG tube was discussed with Jahlil and his family with regards to his chronic malnutrition and need to optimize nutrition if he desires liver transplant. On 2/9, Daouda decided to remove his NG tube and attempt to have a regular diet to try and meet caloric needs by mouth. Palliative care was present at this family meeting and will continue following [facility] outpatient.     RENAL: Cr initially 2.7, down trended with volume resuscitation but increased to 2.23 on 2/2. Peds Nephrology was consulted. His AKI was worsened by tacrolimus and his history of multiple previous episodes of kidney injury. Urine microscopy with bile stained muddy brown casts, consistent with ATN. His renal ultrasound was unremarkable. As a result, his medications were renally dosed. His kidney function normalized by time of discharge with creatinine level of 1.20 on 2/11. This creatinine level was similar to creatinine levels in 09/2019 prior to this admission.   ??  Hepatology: Hx of unconfirmed primary sclerosing cholangitis and cryptogenic cirrhosis s/p liver transplant in 2017 with acute on chronic rejection complicated by ascites, followed by Pediatric GI (Dr. Nonnie Done). Last paracentesis performed on Nov 2020. Upon admission, both pediatric GI and adult hepatology were consulted. Liver ultrasound was obtained which did not show any major changes from prior, showed slightly increased resistive indices noted throughout the hepatic arteries (upper limits of normal/minimally elevated), increased diameter common bile duct, and large-volume abdominopelvic ascites. EGD was performed on 2/2, which showed candidial esophagitis and was started on a 14 day course of Fluconazole (2/2-2/14).   ??  ENDO: Pt with hx of steroid-induced hyperglycemia, previously on insulin but not at the time of admission. Upon transfer to the GI  service, patient was continued on prednisone 20mg  daily and patient refused POC blood glucose check and hence did not comply with insulin SSI regimen recommended by the adult endocrinology service. BG were not significantly elevated throughout admission.     DERM: Hx of steroid-induced acne and atypical dermatitis. Continued home topical clindamycin and benzoyl peroxide.  ??        Discharge Exam:   BP 109/70  - Pulse 92  - Temp 36.7 ??C (Oral)  - Resp 20  - Ht 170.2 cm (5' 7.01)  - Wt 56.7 kg (125 lb)  - SpO2 100%  - BMI 19.57 kg/m??     General:??Lying in bed comfortably, responsive,??in no acute distress, cooperative with exam  Head:??atraumatic, normocephalic  Eyes:??Scleral icterus present;?? extraocular movements intact  Nose:??Clear, no discharge  Neck:????Full range of motion, supple  Lungs: Rhonchorous bilaterally and tachypneic, no wheezing, crackles or rhonchi  Heart:??Regular rate and rhythm, normal S1, S2, no murmurs or gallops.  Abdomen:??Prior surgical scars present, abdomen distended and tender to palpation in all 4 quadrants. Ascitic abdomen with fluid wave present. Active bowel sounds.  Neuro:??Tremor at rest and with movement on UE bilaterally. AAO to person, place, year. Aterixis present.   Extremities:??2+ non-pitting pedal edema present bilaterally. Moves all extremities. Edema of the right elbow/forearm.   Skin: rash/acne over the face. Normal skin turgor; no bruising, rashes or lesions noted    Studies Pending at Time of Discharge:     Pending Labs     Order Current Status    AFB culture Preliminary result          Discharge Medications and Orders:   Discharge Medications:     Your Medication List      STOP taking these medications    amoxicillin-clavulanate 875-125 mg per tablet  Commonly known as: AUGMENTIN     cholecalciferol (vitamin D3) 125 mcg (5,000 unit) tablet        START taking these medications    ANTACID (CALCIUM CARBONATE) 200 mg calcium (500 mg) chewable tablet  Generic drug: calcium carbonate  Chew 3 tablets (600 mg of elem calcium total) Three (3) times a day.     ergocalciferol 1,250 mcg (50,000 unit) capsule  Commonly known as: DRISDOL  Take 1 capsule (50,000 Units total) by mouth once a week for 3 doses.     fluconazole 200 MG tablet  Commonly known as: DIFLUCAN  Take 1 tablet (200 mg total) by mouth daily.     furosemide 20 MG tablet  Commonly known as: LASIX  Take 0.5 tablets (10 mg total) by mouth daily.     hydrOXYzine 25 MG tablet  Commonly known as: ATARAX  Take 1 tablet (25 mg total) by mouth every six (6) hours as needed.     mirtazapine 7.5 MG tablet  Commonly known as: REMERON  Take 1 tablet (7.5 mg total) by mouth nightly.     MVW Complete (pediatric multivit 61-D3-vit K) 1,500-800 unit-mcg Cap  Commonly known as: MVW COMPLETE FORMULATION  Take 1 capsule by mouth daily.     VITAMIN B-1 100 MG tablet  Generic drug: thiamine  Take 1 tablet (100 mg total) by mouth daily for 60 doses.        CHANGE how you take these medications    clindamycin-benzoyl peroxide 1.2 %(1 % base) -5 % gel  Apply to rash on chest  What changed:   ?? how much to take  ?? how to take this  ?? when  to take this     mycophenolate 180 MG EC tablet  Commonly known as: MYFORTIC  Take 3 tablets (540 mg total) by mouth two (2) times a day.  What changed: when to take this     predniSONE 20 MG tablet  Commonly known as: DELTASONE  Take 1 tablet (20 mg total) by mouth daily.  What changed:   ?? medication strength  ?? how much to take  ?? when to take this     spironolactone 100 MG tablet  Commonly known as: ALDACTONE  Take 1 tablet (100 mg total) by mouth daily.  What changed:   ?? medication strength  ?? how much to take     tacrolimus 0.5 MG capsule  Commonly known as: PROGRAF  Take 1 capsule (0.5 mg total) by mouth two (2) times a day.  What changed:   ?? medication strength  ?? how much to take        CONTINUE taking these medications    albuterol 90 mcg/actuation inhaler  Commonly known as: PROVENTIL HFA;VENTOLIN HFA  Inhale 2 puffs every four (4) hours as needed (intractable coughing).     AQUADEKS 100-5 mcg-mg Chew  Generic drug: multivitamin, with zinc  Chew 2 tablets daily.     diclofenac sodium 1 % gel  Commonly known as: VOLTAREN  Apply 2 g topically two (2) times a day.     fluticasone propionate 220 mcg/actuation inhaler  Commonly known as: FLOVENT HFA  1 puff into mouth and swallow, twice a day.  Do not eat or drink for 30 minutes after taking.     ondansetron 4 MG disintegrating tablet  Commonly known as: ZOFRAN-ODT  Take 1 tablet (4 mg total) by mouth every eight (8) hours as needed for up to 7 days.     sulfamethoxazole-trimethoprim 400-80 mg per tablet  Commonly known as: BACTRIM  Take 1 tablet (80 mg of trimethoprim total) by mouth Every Monday, Wednesday, and Friday.     valGANciclovir 450 mg tablet  Commonly known as: VALCYTE  Take 1 tablet (450 mg total) by mouth daily.     XIFAXAN 550 mg Tab  Generic drug: rifAXIMin  Take 1 tablet (550 mg total) by mouth Two (2) times a day.             DME Orders:      Home Health Orders:   None    Discharge Instructions:   Activity:   Activity Instructions     Activity as tolerated          Diet:   Diet Instructions     Discharge diet (specify)      Discharge Nutrition Therapy:  Enteral Nutrition  Sodium Restricted (2gm Na)       Please consider Ensure supplements 6 per day             Instructions and Other Follow-ups after Discharge:  Follow Up instructions and Outpatient Referrals     Ambulatory referral to Transplant Hepatology      Discharge instructions          Other Instructions:  Other Instructions     Discharge instructions      During your hospital stay you were cared for by a pediatric Gastroenterologist who works with your doctor to provide the best care for you. After discharge, your care is transferred back to your outpatient/clinic doctor so please contact them for new concerns.    Please call [name], MD [phone]  if patient has:     - Any Fever with a temperature greater than 100.2F  - Any Respiratory distress, increased work of breathing, shortness of breath, difficulty breathing, chest pain, chest tightness or activity intolerance  - Any Abdominal distention, abdominal pain, dark or black stools, or yellowing of your skin/ eyes  - Any Changes in behavior, confusion, mental status changes, increased tiredness, increased sleepiness or  decreased energy   - Any Feelings of worsening anxiety or depression  - Any Medication Intolerance  - Any Skin lesions or skin rashes that have worsening redness, swelling, drainage or pain at the sites  - Any Symptoms of dehydration or diet intolerance such as nausea, vomiting, diarrhea, decreased urine output, weight loss, decreased appetite or decreased oral intake  - Any Questions or Concerns    Important Phone Numbers  [facility] Pediatric Gastroenterology / Liver Transplant: [phone]  [facility] Liver Transplant Coordinator: Jamey Reas: [phone]  [facility] Liver Transplant Office: [phone]   [facility] Dermatology: [phone]  - [facility]: [phone]   [facility] Operator: [phone] for you specialists on-call  [name], MD [phone]    If Cailean's prescriptions were filled before going home at [facility], located in the [facility] (ground floor), you can call them at [phone] with questions. They are open from 7:00 a.m. to 8:00 p.m. Monday - Friday. Please confirm weekend and holiday hours with the pharmacy directly.    If you would like to have future refills filled at another pharmacy, please have the pharmacy of your choice call [facility] at the above number.     If you are interested in getting future refills sent directly to your home please contact [facility] at [phone]. They are open Monday - Friday 8:00 a.m. to 4:30 p.m. There is a pharmacist available 24 hours a day for emergency questions.     *Outpatient Follow-up Lab Plan:  Your next lab draw will be discussed at your next appointment. Please continue your usual schedule of outpatient visits. You should have labs drawn weekly at [facility]: CBC with diff, CMP, GGT, Phos, Mg, INR, Tacrolimus trough level, direct bilirubin and CMV. You should get these labs drawn BEFORE your morning tacrolimus dose.   ??             Future Appointments:  Appointments which have been scheduled for you    Jan 03, 2020 12:30 PM  (Arrive by 12:00 PM)  RETURN  GENERAL with Glorianne Manchester, MD  [facility] CHILDRENS GASTROENTEROLOGY [place] [facility] REGION) [address]  [place] [address]  [phone]      Jan 10, 2020 10:30 AM  (Arrive by 10:20 AM)  RETURN VIDEO - EPIC AMWELL with Addison Lank, MD  [facility] PSYCHIATRY Transplant AT [facility] REGION) [address]  [address]  [place] [address]  [phone]   Please sign into My [place] Chart at least 15 minutes before your appointment to complete any unfinished steps in the Get Started process. Get Started is required to be complete prior to your video visit.     Please visit [email or web address] for information about our safe promise as you receive care at [facility].    My  chart allows you to manage your health, send messages to your provider, view your test results, schedule and manage appointments, and request prescription refills securely and conveniently from your  computer or mobile device.    You can go to [email or web address] to Sign in to your My [place] Chart account with your username and password. If you have forgotten them, please choose the Forgot Username? and/or Forgot Password? links to gain access. You can also access your My [place] Chart account with the free MyChart mobile app for Android or iPhone.    If you need further assistance with accessing your My [place] Chart account or for assistance in reaching your provider's office to reschedule or cancel your appointment  call [place] [facility] at [phone].         Jan 13, 2020 10:00 AM  (Arrive by 9:30 AM)  RETURN  HEPATOLOGY with Melvern Sample, ANP [facility] LIVER TRANSPLANT [place] [facility] REGION) [address]  [place] [address]  [phone]      Jan 13, 2020 11:00 AM  (Arrive by 10:30 AM)  URGENT with Theda Sers, PhD  [facility] TRANSPLANT SURGERY [facility] REGION) [address]  [place] [address]  [phone]      Jan 13, 2020  1:00 PM  (Arrive by 12:30 PM)  RETURN NUTRITION with Lanelle Bal, RD/LDN  [facility] NUTRITION SERVICES TRANSPLANT [place] [facility] REGION) [address]  [place] [address]  [phone]      Jan 13, 2020  2:00 PM  (Arrive by 1:30 PM)  LAB ONLY with SURTRA LAB ONLY  [facility] TRANSPLANT SURGERY [place] [facility] REGION) [address]  [place] [address]  [phone]      Feb 07, 2020  8:15 AM  (Arrive by 8:05 AM)  RETURN VIDEO - EPIC AMWELL with Addison Lank, MD  [facility] PSYCHIATRY Transplant AT [facility] REGION) [address]  [address]  [place] [address]  [phone]   Please sign into My [name] Chart at least 15 minutes before your appointment to complete any unfinished steps in the Get Started process. Get Started is required to be complete prior to your video visit.     Please visit [email or web address] for information about our safe promise as you receive care at [facility].    My [place] chart allows you to manage your health, send messages to your provider, view your test results, schedule and manage appointments, and request prescription refills securely and conveniently from your computer or mobile device.    You can go to [email or web address] to Sign in to your My [place] Chart account with your username and password. If you have forgotten them, please choose the Forgot Username? and/or Forgot Password? links to gain access. You can also access your My [place] Chart account with the free MyChart mobile app for Android or iPhone.    If you need further assistance with accessing your My [place] Chart account or for assistance in reaching your provider's office to reschedule or cancel your appointment  call  [facility] at [phone].         Mar 15, 2020 12:00 PM  (Arrive by 11:30 AM)  RETURN VIDEO - OTHER with Ronelle Nigh, FNP  [facility] CHILDRENS HEMATOLOGY ONCOLOGY CANCER HOSP [facility] REGION) [address]  [place] [address]  [phone]             Tora Duck, MD, MPH  Peds Intern, PGY-1  Pager: [phone]  December 23, 2019   1:20 PM    I saw  and evaluated the patient, participating in the key portions of the service on the day of discharge.?? I reviewed the resident's note and agree with the discharge plans and disposition. I was available and supervised the time spent by resident in work on day of discharge. Resident spent more than 30 minutes in discharge planning services. Alita Chyle, MD

## 2019-12-23 NOTE — Unmapped (Signed)
Pediatric Daily Progress Note     Assessment/Plan:     Principal Problem:    AKI (acute kidney injury) (CMS-HCC)  Active Problems:    Malnutrition (CMS-HCC)    History of liver transplant (CMS-HCC)    Major depressive disorder    Transaminitis    Liver transplant rejection (CMS-HCC)    Normocytic anemia    Hypoalbuminemia    Steroid-induced hyperglycemia    Thrombocytopenia (CMS-HCC)    Ascites of liver    Hyperbilirubinemia    ATN (acute tubular necrosis) (CMS-HCC)    Pleural effusion    Hypokalemia    Rectal bleeding    Coagulopathy (CMS-HCC)    Candidal esophagitis (CMS-HCC)    Fibrinogen decreased (CMS-HCC)    Vitamin D deficiency  Resolved Problems:    * No resolved hospital problems. *    [name] Gross is a 19 y.o. 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 He is currently in stable condition. Patient is tolerating regular diet well however is experiencing challenges meeting daily caloric goals. Otherwise, he has been compliant with medications and labs draws. Creatinine continues to downtrend to 1.20 near creatinine levels prior to admission in November 2020. Given this, patient given albumin and lasix dose to help remove edema. Potassium was elevated to 5.5 in the setting of restarting tacrolimus and increasing spironolactone. EKG completed and was unremarkable. Otherwise, plan is to discharge patient tomorrow and have close outpatient follow up.     Acute Kidney Injury, resolving: Likely due to volume depletion in the setting of coffee ground emesis and tacrolimus use. Urine microscopy showing muddy brown casts c/w ATN.   - Peds nephrology following, appreciate recs.    - Per peds nephro, will consider restarting fluids if pt reaches net negative >500-700 mL.  - Daily weights  - Renal U/S completed 2/3 - No hydronephrosis, renal parenchyma similar to prior  - Creatinine: 2/9 1.31 (prior to admission, creatinine 1.07-1.78 in November 2020)  - Medications are renally-dosed     FEN/GI  - Strict I/O  - NG tube removed 2/9  - Sodium restricted diet (<2g) with calorie count   - 2000 kcal caloric goal daily   - Ensure clear 6x daily   - Albumin 2/6 - 2.9 with goal of >3  - KPhos PO to 1000mg  bid  - 50,000U Vita D once weekly for 4 weeks then transition to Vitamin D3 5000mg  qd   - Calcium carbonate 600mg  TID for bone health   - IV Zofran 4mg  q8h PRN  - IV Protonix 40mg  BID  - Continue home aquadeks, mag-ox    Candidal Esophagitis  - Day 9/13 Fluconazole 200mg  every day (loading dose 400mg  given on 2/2)   - S/p vancomycin, cefepime and flagyl due to initial concern for SBP which is now unlikely               - No growth on peritoneal cultures collected on 1/31   ??  H/o acute on chronic liver rejection s/p liver transplant 2017  - Peds GI and adult hepatology following  - Ammonia 2/6 - 11   - Continue to hold bactrim prophylaxis in setting of AKI  - Tacrolimus 0.5mg  BID  - Spironolactone 100mg  daily  - Mycophenolate 540mg  BID  -  Rifaximin 550mg  BID  - Valganciclovir 450mg  daily  - Prednisone 20mg  daily;               - s/p stress dosing in PICU     Heme: History of DVT in RUE now resolved on PVL 2/5, no signs of DVT during current admission.   - Fibrinogen low at 91. No interventions required at this time per adult hepatology. Will continue to defer to adult hepatology recs.   - CBC daily   - SCD's in place   - Will check INR every day    - INR 1.51 on 2/9     Psych:  - Psychiatry following, appreciate recs. Per Psych, patient's presentation of mood symptoms is consistent with MDD. Pt interested in starting Lexapro, but will hold until more medically stable.   - CBT with Psychology 1x/week  - Remeron 7.5mg  nightly   - Hydroxyzine 25mg  PO q6h PRN for anxiety and sleep - Baseline EKG wnl with normal QTc interval  - Thiamine PO 100 mg daily   - Folate supplementation and multivitamin daily.  - Continue delirium protocol, and encouraged movement and orientation strategies    Endocrine: Patient with hyperglycemia (220 on 2/4) in setting of prednisone.   - Adult Endocrinology consulted, appreciate recs  - POC BG monitoring q6 hrs but refusing   - Regular insulin sensitive correctional scale q6h while on TF. If he does oral feeds, will consider Lispro with oral feeding  - Goal BG of 140-180 while inpatient    Social:  - Consult Palliative Care to clarify patient's goals for admission  ??  Lines: PIV, NGT  ??  Plan of care discussed with caregiver(s) at bedside.    Subjective:     Interval History: Overnight, there were no acute events. Patient was afebrile and vital signs were within normal range. Tolerating diet. Voiding and stooling appropriately.       Objective:     Vital signs in last 24 hours:  Temp:  [36.8 ??C-37 ??C] 36.8 ??C  Heart Rate:  [84-90] 90  SpO2 Pulse:  [85] 85  Resp:  [18-20] 20  BP: (103-141)/(53-73) 141/73  MAP (mmHg):  [69-93] 93  SpO2:  [98 %-100 %] 98 %  Intake/Output last 3 shifts:  I/O last 3 completed shifts:  In: 677 [P.O.:420; I.V.:257]  Out: 0     Physical Exam:  General: Lying in bed comfortably, awake, in no acute distress, cooperative with exam  Head: atraumatic, normocephalic  Eyes: Scleral icterus present;  extraocular movements intact  Nose: Clear, no discharge  Neck:  Full range of motion, supple  Lungs: Rhonchorous bilaterally and tachypneic, no wheezing, crackles or rhonchi  Heart: Regular rate and rhythm, normal S1, S2, no murmurs or gallops.  Abdomen: Prior surgical scars present, abdomen soft, non-tender. Ascitic abdomen with fluid wave present. Active bowel sounds. Tenderness noted to palpation in all 4 quadrants   Neuro: Tremor at rest and with movement on UE bilaterally. AAO to person, place, year. Aterixis present.   Extremities:  2+ non-pitting pedal edema present bilaterally. Moves all extremities. Edema of the right elbow/forearm.   Skin: rash/acne over the face. Normal skin turgor; no bruising, rashes or lesions noted    Studies: Personally reviewed and interpreted.    Labs/Studies:  Labs and Studies from the last 24hrs per EMR and Reviewed and   All lab results last 24 hours:    Recent Results (from the past 24 hour(s))   Tacrolimus Level, Trough  Collection Time: 12/22/19  7:56 AM   Result Value Ref Range    Tacrolimus, Trough 2.3 (L) 5.0 - 15.0 ng/mL   PT-INR    Collection Time: 12/22/19  7:56 AM   Result Value Ref Range    PT 16.8 (H) 10.5 - 13.5 sec    INR 1.44    Albumin    Collection Time: 12/22/19  7:56 AM   Result Value Ref Range    Albumin 2.9 (L) 3.5 - 5.0 g/dL   Basic Metabolic Panel    Collection Time: 12/22/19  7:56 AM   Result Value Ref Range    Sodium 139 135 - 145 mmol/L    Potassium 5.5 (H) 3.5 - 5.0 mmol/L    Chloride 114 (H) 98 - 107 mmol/L    CO2      Anion Gap      BUN 38 (H) 7 - 21 mg/dL    Creatinine 6.57 8.46 - 1.30 mg/dL    BUN/Creatinine Ratio 32     EGFR CKD-EPI Non-African American, Male 86 >=60 mL/min/1.69m2    EGFR CKD-EPI African American, Male >90 >=60 mL/min/1.65m2    Glucose 192 (H) 70 - 179 mg/dL    Calcium 9.3 8.5 - 96.2 mg/dL   Magnesium Level    Collection Time: 12/22/19  7:56 AM   Result Value Ref Range    Magnesium 1.4 (L) 1.6 - 2.2 mg/dL   Phosphorus Level    Collection Time: 12/22/19  7:56 AM   Result Value Ref Range    Phosphorus 3.7 2.9 - 4.7 mg/dL   ECG 12 Lead    Collection Time: 12/22/19 12:28 PM   Result Value Ref Range    EKG Systolic BP  mmHg    EKG Diastolic BP  mmHg    EKG Ventricular Rate 84 BPM    EKG Atrial Rate 84 BPM    EKG P-R Interval 124 ms    EKG QRS Duration 68 ms    EKG Q-T Interval 364 ms    EKG QTC Calculation 430 ms    EKG Calculated P Axis 44 degrees    EKG Calculated R Axis 43 degrees    EKG Calculated T Axis 37 degrees    QTC Fredericia 407 ms ========================================    Tora Duck, MD, MPH  Peds Intern, PGY-1  Pager: [phone]  December 22, 2019   8:03 PM    I supervised the resident physician on subsequent day care who spent at least 35 minutes on the floor or unit in direct patient care. The direct patient care time included face-to-face time with the patient, reviewing the patient's chart, communicating with the family and/or other professionals and coordinating care. Greater than 50% of this time was spent in counseling or coordinating care with the patient regarding  Importance of nutrition, compliance to medication . I was available throughout care provided. Alita Chyle, MD

## 2019-12-27 DIAGNOSIS — Z944 Liver transplant status: Principal | ICD-10-CM

## 2019-12-27 MED ORDER — MAGNESIUM OXIDE 400 MG (241.3 MG MAGNESIUM) TABLET: 400 mg | tablet | Freq: Two times a day (BID) | 5 refills | 30 days | Status: AC

## 2019-12-27 MED ORDER — MAGNESIUM OXIDE 400 MG (241.3 MG MAGNESIUM) TABLET
ORAL_TABLET | Freq: Two times a day (BID) | ORAL | 5 refills | 30.00000 days | Status: CP
Start: 2019-12-27 — End: 2019-12-27

## 2019-12-27 MED ORDER — PANTOPRAZOLE 40 MG TABLET,DELAYED RELEASE
ORAL_TABLET | Freq: Every day | ORAL | 5 refills | 30.00000 days | Status: CP
Start: 2019-12-27 — End: ?

## 2019-12-28 MED ORDER — ERGOCALCIFEROL (VITAMIN D2) 1,250 MCG (50,000 UNIT) CAPSULE
ORAL_CAPSULE | ORAL | 0 refills | 21 days | Status: CP
Start: 2019-12-28 — End: 2020-01-12

## 2019-12-28 NOTE — Unmapped (Signed)
MD orders signed by Dr. Jessee Avers and faxed to [facility] at [phone] by our office staff.

## 2019-12-29 NOTE — Unmapped (Signed)
Submitted PA to [facility] for continued use of Protonix. Status pending. Confirmation number: [id number]

## 2020-01-03 ENCOUNTER — Encounter: Admit: 2020-01-03 | Discharge: 2020-01-04 | Payer: PRIVATE HEALTH INSURANCE

## 2020-01-03 ENCOUNTER — Ambulatory Visit
Admit: 2020-01-03 | Discharge: 2020-01-04 | Payer: PRIVATE HEALTH INSURANCE | Attending: Pediatric Gastroenterology | Primary: Pediatric Gastroenterology

## 2020-01-03 LAB — CBC W/ AUTO DIFF
BASOPHILS ABSOLUTE COUNT: 0.1 10*9/L (ref 0.0–0.1)
EOSINOPHILS ABSOLUTE COUNT: 0.1 10*9/L (ref 0.0–0.4)
EOSINOPHILS RELATIVE PERCENT: 0.3 %
HEMATOCRIT: 33.3 % — ABNORMAL LOW (ref 41.0–53.0)
HEMOGLOBIN: 10.2 g/dL — ABNORMAL LOW (ref 13.5–17.5)
LYMPHOCYTES ABSOLUTE COUNT: 1.1 10*9/L — ABNORMAL LOW (ref 1.5–5.0)
LYMPHOCYTES RELATIVE PERCENT: 6.9 %
MEAN CORPUSCULAR HEMOGLOBIN CONC: 30.7 g/dL — ABNORMAL LOW (ref 31.0–37.0)
MEAN CORPUSCULAR HEMOGLOBIN: 27.9 pg (ref 26.0–34.0)
MEAN CORPUSCULAR VOLUME: 91 fL (ref 80.0–100.0)
MONOCYTES ABSOLUTE COUNT: 0.5 10*9/L (ref 0.2–0.8)
MONOCYTES RELATIVE PERCENT: 3.3 %
NEUTROPHILS ABSOLUTE COUNT: 14.3 10*9/L — ABNORMAL HIGH (ref 2.0–7.5)
NEUTROPHILS RELATIVE PERCENT: 88.1 %
RED BLOOD CELL COUNT: 3.66 10*12/L — ABNORMAL LOW (ref 4.50–5.90)
RED CELL DISTRIBUTION WIDTH: 28 % — ABNORMAL HIGH (ref 12.0–15.0)
WBC ADJUSTED: 16.2 10*9/L — ABNORMAL HIGH (ref 4.5–11.0)

## 2020-01-03 LAB — COMPREHENSIVE METABOLIC PANEL
ALBUMIN: 3.5 g/dL (ref 3.5–5.0)
ALKALINE PHOSPHATASE: 1837 U/L — ABNORMAL HIGH (ref 65–260)
ALT (SGPT): 458 U/L — ABNORMAL HIGH (ref <50)
ANION GAP: 17 mmol/L — ABNORMAL HIGH (ref 7–15)
AST (SGOT): 447 U/L — ABNORMAL HIGH (ref 19–55)
BILIRUBIN TOTAL: 53.1 mg/dL — ABNORMAL HIGH (ref 0.0–1.2)
BLOOD UREA NITROGEN: 49 mg/dL — ABNORMAL HIGH (ref 7–21)
BUN / CREAT RATIO: 27
CALCIUM: 9.4 mg/dL (ref 8.5–10.2)
CO2: 6 mmol/L — CL (ref 22.0–30.0)
CREATININE: 1.8 mg/dL — ABNORMAL HIGH (ref 0.70–1.30)
EGFR CKD-EPI AA MALE: 62 mL/min/{1.73_m2} (ref >=60)
EGFR CKD-EPI NON-AA MALE: 53 mL/min/{1.73_m2} — ABNORMAL LOW (ref >=60)
GLUCOSE RANDOM: 228 mg/dL — ABNORMAL HIGH (ref 70–99)
POTASSIUM: 5.8 mmol/L — ABNORMAL HIGH (ref 3.5–5.0)
PROTEIN TOTAL: 7.4 g/dL (ref 6.5–8.3)
SODIUM: 125 mmol/L — ABNORMAL LOW (ref 135–145)

## 2020-01-03 LAB — SLIDE REVIEW

## 2020-01-03 LAB — TACROLIMUS BLOOD: Lab: 4.3

## 2020-01-03 LAB — GAMMA GLUTAMYL TRANSFERASE: Gamma glutamyl transferase:CCnc:Pt:Ser/Plas:Qn:: 359 — ABNORMAL HIGH

## 2020-01-03 LAB — TARGET CELLS

## 2020-01-03 LAB — EOSINOPHILS ABSOLUTE COUNT: Eosinophils:NCnc:Pt:Bld:Qn:Automated count: 0.1

## 2020-01-03 LAB — PROTEIN TOTAL: Protein:MCnc:Pt:Ser/Plas:Qn:: 7.4

## 2020-01-03 LAB — MAGNESIUM: Magnesium:MCnc:Pt:Ser/Plas:Qn:: 2

## 2020-01-03 LAB — PHOSPHORUS: Phosphate:MCnc:Pt:Ser/Plas:Qn:: 5.3 — ABNORMAL HIGH

## 2020-01-03 NOTE — Unmapped (Signed)
Dr.Lichtman notified of pt BP' x 2 (see flowsheets).

## 2020-01-04 NOTE — Unmapped (Signed)
Assessment/Plan:        Liver failure - I suggested many times that he be admitted but he refused - mother also wanted him admitted - then we said he could go to local ER for fluids but he refused this as well (I spoke to mom early Feb 24 who said he would not go to local ER last night) - labs are very bad with bicarb 6 - glucose 250, so his urine output may persist due to high glucose in spite of dehydration - BUN 49 even with low muscle mass  - he had no liver flap and was oriented - but still refused to be admitted - I mentioned the severity of the problem and possible death but still he refused - he has Hospice visit the morning of Feb 24 - mom will keep [place] updated         Subjective:     Patient ID: [name] Gross is a 19 y.o. male.    HPI - he was seen in person along with mother - he came in stroller - cannot walk long distances  - overall doing very poorly   - mom says she tries to get him to eat and drink - supposed to get 6 cans of Ensure or Boost daily, but maybe he gets 2 - eats little other foods  - his ascites and leg swelling are gone and mom says his weight was 99 lb - very low - was about 116 lb in hospital  - she says he drinks other liquids and has urine output   - very jaundice  - his BP was low in the clinic   - at home with mother - stays upstairs and walks a little upstairs - rarely comes downs stairs because it is hard to get back up      The following portions of the patient's history were reviewed and updated as appropriate: allergies, current medications, past family history, past medical history, past social history, past surgical history and problem list.      Review of Systems   Constitutional: Positive for appetite change, fatigue and unexpected weight change.   Eyes: Negative.    Respiratory: Negative.    Cardiovascular: Negative.    Gastrointestinal: Negative.  Negative for abdominal distention, abdominal pain, anal bleeding, blood in stool, diarrhea, nausea, rectal pain and vomiting.   Endocrine: Positive for polyuria.   Genitourinary: Negative.    Musculoskeletal: Negative.    Skin: Positive for color change.   Allergic/Immunologic: Negative.    Neurological: Negative.    Psychiatric/Behavioral: Positive for behavioral problems.           Objective:    Physical Exam  Eyes:      Pupils: Pupils are equal, round, and reactive to light.   Neck:      Musculoskeletal: Normal range of motion.   Cardiovascular:      Rate and Rhythm: Normal rate.      Pulses: Normal pulses.   Abdominal:      General: Abdomen is flat. There is no distension.      Palpations: There is no mass.      Tenderness: There is no abdominal tenderness. There is no right CVA tenderness, left CVA tenderness, guarding or rebound.      Hernia: No hernia is present.   Skin:     Capillary Refill: Capillary refill takes less than 2 seconds.      Coloration: Skin is jaundiced.   Neurological:  General: No focal deficit present.      Mental Status: He is alert and oriented to person, place, and time.      Motor: Weakness present.      Gait: Gait abnormal.      Deep Tendon Reflexes: Reflexes normal.

## 2020-01-04 NOTE — Unmapped (Signed)
Lupita Leash RN from [facility] called to report that mother [name] Gross called her to ask for help as admission had been recommended based on labs yesterday (do not see this documented in chart) and refused to go. He has become more agitated, seems delirious, refusing to go to ER or to allow hospice to make a visit to assess him. Reviewed labs with Lupita Leash who was trying to work on a plan for IV hydration if [name] would agree to this but can't offer this in an unmonitored setting given degree of electrolyte derangement. We made a plan that she will call him and review concerns that his current condition is life-threatening and that if his goal is life extension (he has not been explicit about this) he will need to seek medical care in the ER. If he chooses not to seek emergency care and instead to stay home, there is a chance he will not survive this episode. Hospice can offer additional support at home or in inpatient unit if he would accept this. She will provide updates later after further contact with [name] and family.      Lab Results   Component Value Date    WBC 16.2 (H) 01/03/2020    HGB 10.2 (L) 01/03/2020    HCT 33.3 (L) 01/03/2020    PLT 242 01/03/2020       Lab Results   Component Value Date    NA 125 (L) 01/03/2020    K 5.8 (H) 01/03/2020    CL 102 01/03/2020    CO2 6.0 (LL) 01/03/2020    BUN 49 (H) 01/03/2020    CREATININE 1.80 (H) 01/03/2020    GLU 228 (H) 01/03/2020    CALCIUM 9.4 01/03/2020    MG 2.0 01/03/2020    PHOS 5.3 (H) 01/03/2020       Lab Results   Component Value Date    BILITOT 53.1 (H) 01/03/2020    BILIDIR 28.30 (H) 12/12/2019    PROT 7.4 01/03/2020    ALBUMIN 3.5 01/03/2020    ALT 458 (H) 01/03/2020    AST 447 (H) 01/03/2020    ALKPHOS 1,837 (H) 01/03/2020    GGT 359 (H) 01/03/2020       Lab Results   Component Value Date    PT 16.8 (H) 12/22/2019    INR 1.44 12/22/2019    APTT 37.8 (H) 12/15/2019

## 2020-01-04 NOTE — Unmapped (Signed)
Received a page from [facility] lab with critcal lab result moderate schistocytes on 01/03/2020 lab draw.     Noted that he has had this result in the past. Paged Peds GI provider, Dr. Blenda Mounts. Mentioned the critical lab call with moderate schistocytes noting he has had this at the end of January and in February but while in the hospital and patient currently outpatient; also mentioned he had an appointment with Dr. Melrose Nakayama today; lab note shows the CO2 was reviewed with provider.   Dr. Jon Billings reviewed this result/labs and confirmed no interventions this evening; mentioned to have pt's coordinator follow up with Dr. Melrose Nakayama tomorrow as to when he wants patient to repeat his labs.   Routing note to pt's coordinator.

## 2020-01-05 MED ORDER — MAGNESIUM OXIDE 400 MG (241.3 MG MAGNESIUM) TABLET
ORAL_TABLET | Freq: Two times a day (BID) | ORAL | 5 refills | 30.00000 days | Status: CP
Start: 2020-01-05 — End: ?

## 2020-01-05 MED ORDER — PANTOPRAZOLE 40 MG TABLET,DELAYED RELEASE
ORAL_TABLET | Freq: Every day | ORAL | 5 refills | 30 days | Status: CP
Start: 2020-01-05 — End: ?

## 2020-01-06 NOTE — Unmapped (Signed)
[facility] Refill Coordination Note    Specialty Medication(s) to be Shipped:   Transplant:  mycophenolic acid 180mg , valgancyclovir 450mg  and XIFAXAN 550mg     Other medication(s) to be shipped:   bactrim     [name] Gross, DOB: 06/09/2001  Phone: [phone] (home)       All above HIPAA information was verified with patient's caregiver, mom     Was a translator used for this call? No    Completed refill call assessment today to schedule patient's medication shipment from the [facility] [phone]).       Specialty medication(s) and dose(s) confirmed: Regimen is correct and unchanged.   Changes to medications: [name] Gross reports no changes at this time.  Changes to insurance: No  Questions for the pharmacist: No    Confirmed patient received Welcome Packet with first shipment. The patient will receive a drug information handout for each medication shipped and additional FDA Medication Guides as required.       DISEASE/MEDICATION-SPECIFIC INFORMATION        N/A    SPECIALTY MEDICATION ADHERENCE     Medication Adherence    Patient reported X missed doses in the last month: 0           mycophenolic acid 180mg : 7 days worth of medication on hand.  valgancyclovir 450mg : 10 days worth of medication on hand.  XIFAXAN 550mg : 10 days worth of medication on hand.            SHIPPING     Shipping address confirmed in Epic.     Delivery Scheduled: Yes, Expected medication delivery date: 01/10/20.     Medication will be delivered via UPS to the prescription address in Epic WAM.    [name] Gross   [facility] Specialty Technician

## 2020-01-07 ENCOUNTER — Encounter (HOSPITAL_COMMUNITY): Payer: Self-pay

## 2020-01-07 ENCOUNTER — Emergency Department (HOSPITAL_COMMUNITY): Payer: Medicaid Other

## 2020-01-07 ENCOUNTER — Emergency Department (HOSPITAL_COMMUNITY)
Admission: EM | Admit: 2020-01-07 | Discharge: 2020-01-07 | Disposition: A | Discharge status: Other Acute Inpatient Hospital | Payer: Medicaid Other | Attending: Emergency Medicine | Admitting: Emergency Medicine

## 2020-01-07 DIAGNOSIS — R6521 Severe sepsis with septic shock: Secondary | ICD-10-CM | POA: Diagnosis not present

## 2020-01-07 DIAGNOSIS — A419 Sepsis, unspecified organism: Secondary | ICD-10-CM | POA: Insufficient documentation

## 2020-01-07 DIAGNOSIS — Z20822 Contact with and (suspected) exposure to covid-19: Secondary | ICD-10-CM | POA: Insufficient documentation

## 2020-01-07 DIAGNOSIS — Z79899 Other long term (current) drug therapy: Secondary | ICD-10-CM | POA: Insufficient documentation

## 2020-01-07 DIAGNOSIS — D573 Sickle-cell trait: Secondary | ICD-10-CM | POA: Diagnosis not present

## 2020-01-07 DIAGNOSIS — R531 Weakness: Secondary | ICD-10-CM | POA: Diagnosis present

## 2020-01-07 LAB — URINALYSIS, ROUTINE W REFLEX MICROSCOPIC
Glucose, UA: NEGATIVE mg/dL
Hgb urine dipstick: NEGATIVE
Ketones, ur: NEGATIVE mg/dL
Leukocytes,Ua: NEGATIVE
Nitrite: NEGATIVE
Protein, ur: NEGATIVE mg/dL
Specific Gravity, Urine: 1.01 (ref 1.005–1.030)
pH: 5 (ref 5.0–8.0)

## 2020-01-07 LAB — CBC WITH DIFFERENTIAL/PLATELET
Abs Immature Granulocytes: 0 10*3/uL (ref 0.00–0.07)
Basophils Absolute: 0 10*3/uL (ref 0.0–0.1)
Basophils Relative: 0 %
Eosinophils Absolute: 0 10*3/uL (ref 0.0–0.5)
Eosinophils Relative: 0 %
HCT: 23.5 % — ABNORMAL LOW (ref 39.0–52.0)
Hemoglobin: 8.5 g/dL — ABNORMAL LOW (ref 13.0–17.0)
Lymphocytes Relative: 4 %
Lymphs Abs: 0.6 10*3/uL — ABNORMAL LOW (ref 0.7–4.0)
MCH: 29.1 pg (ref 26.0–34.0)
MCHC: 36.2 g/dL — ABNORMAL HIGH (ref 30.0–36.0)
MCV: 80.5 fL (ref 80.0–100.0)
Monocytes Absolute: 0.6 10*3/uL (ref 0.1–1.0)
Monocytes Relative: 4 %
Neutro Abs: 14.3 10*3/uL — ABNORMAL HIGH (ref 1.7–7.7)
Neutrophils Relative %: 92 %
Platelets: 121 10*3/uL — ABNORMAL LOW (ref 150–400)
RBC: 2.92 MIL/uL — ABNORMAL LOW (ref 4.22–5.81)
RDW: 27.1 % — ABNORMAL HIGH (ref 11.5–15.5)
WBC: 15.5 10*3/uL — ABNORMAL HIGH (ref 4.0–10.5)
nRBC: 0 /100 WBC
nRBC: 0.2 % (ref 0.0–0.2)

## 2020-01-07 LAB — COMPREHENSIVE METABOLIC PANEL
ALT: 617 U/L — ABNORMAL HIGH (ref 0–44)
AST: 694 U/L — ABNORMAL HIGH (ref 15–41)
Albumin: 2.4 g/dL — ABNORMAL LOW (ref 3.5–5.0)
Alkaline Phosphatase: 1641 U/L — ABNORMAL HIGH (ref 38–126)
Anion gap: 12 (ref 5–15)
BUN: 49 mg/dL — ABNORMAL HIGH (ref 6–20)
CO2: 9 mmol/L — ABNORMAL LOW (ref 22–32)
Calcium: 9.1 mg/dL (ref 8.9–10.3)
Chloride: 102 mmol/L (ref 98–111)
Creatinine, Ser: 2.56 mg/dL — ABNORMAL HIGH (ref 0.61–1.24)
GFR calc Af Amer: 40 mL/min — ABNORMAL LOW (ref >60)
GFR calc non Af Amer: 35 mL/min — ABNORMAL LOW (ref >60)
Glucose, Bld: 174 mg/dL — ABNORMAL HIGH (ref 70–99)
Potassium: 3.8 mmol/L (ref 3.5–5.1)
Sodium: 123 mmol/L — ABNORMAL LOW (ref 135–145)
Total Bilirubin: 46.9 mg/dL (ref 0.3–1.2)
Total Protein: 5 g/dL — ABNORMAL LOW (ref 6.5–8.1)

## 2020-01-07 LAB — PROTIME-INR
INR: 2 — ABNORMAL HIGH (ref 0.8–1.2)
Prothrombin Time: 22.4 seconds — ABNORMAL HIGH (ref 11.4–15.2)

## 2020-01-07 LAB — CBG MONITORING, ED: Glucose-Capillary: 170 mg/dL — ABNORMAL HIGH (ref 70–99)

## 2020-01-07 LAB — RESPIRATORY PANEL BY RT PCR (FLU A&B, COVID)
Influenza A by PCR: NEGATIVE
Influenza B by PCR: NEGATIVE
SARS Coronavirus 2 by RT PCR: NEGATIVE

## 2020-01-07 LAB — LACTIC ACID, PLASMA
Lactic Acid, Venous: 1.5 mmol/L (ref 0.5–1.9)
Lactic Acid, Venous: 1.9 mmol/L (ref 0.5–1.9)

## 2020-01-07 LAB — AMMONIA: Ammonia: 49 umol/L — ABNORMAL HIGH (ref 9–35)

## 2020-01-07 LAB — APTT: aPTT: 44 seconds — ABNORMAL HIGH (ref 24–36)

## 2020-01-07 MED ORDER — SODIUM CHLORIDE 0.9 % IV SOLN: 2.0000 g | INTRAVENOUS | Status: DC

## 2020-01-07 MED ORDER — VANCOMYCIN HCL IN DEXTROSE 1-5 GM/200ML-% IV SOLN
1000.0000 mg | Freq: Once | INTRAVENOUS | Status: AC
  Administered 2020-01-07: 1000 mg via INTRAVENOUS
  Filled 2020-01-07: qty 200

## 2020-01-07 MED ORDER — NOREPINEPHRINE 4 MG/250ML-% IV SOLN
0.0000 ug/min | INTRAVENOUS | Status: DC
  Administered 2020-01-07: 2 ug/min via INTRAVENOUS
  Filled 2020-01-07: qty 250

## 2020-01-07 MED ORDER — VANCOMYCIN VARIABLE DOSE PER UNSTABLE RENAL FUNCTION (PHARMACIST DOSING): Status: DC

## 2020-01-07 MED ORDER — METRONIDAZOLE IN NACL 5-0.79 MG/ML-% IV SOLN
500.0000 mg | Freq: Once | INTRAVENOUS | Status: AC
  Administered 2020-01-07: 500 mg via INTRAVENOUS
  Filled 2020-01-07: qty 100

## 2020-01-07 MED ORDER — SODIUM CHLORIDE 0.9 % IV SOLN
2.0000 g | Freq: Once | INTRAVENOUS | Status: AC
  Administered 2020-01-07: 2 g via INTRAVENOUS
  Filled 2020-01-07: qty 2

## 2020-01-07 MED ORDER — LACTATED RINGERS IV BOLUS (SEPSIS)
1000.0000 mL | Freq: Once | INTRAVENOUS | Status: AC
  Administered 2020-01-07: 1000 mL via INTRAVENOUS

## 2020-01-07 MED ORDER — SODIUM CHLORIDE 0.9 % IV BOLUS
500.0000 mL | Freq: Once | INTRAVENOUS | Status: AC
  Administered 2020-01-07: 500 mL via INTRAVENOUS

## 2020-01-07 MED ORDER — LACTATED RINGERS IV BOLUS (SEPSIS)
500.0000 mL | Freq: Once | INTRAVENOUS | Status: AC
  Administered 2020-01-07: 500 mL via INTRAVENOUS

## 2020-01-07 NOTE — ED Notes (Signed)
Pt transferred to [facility] PICU by [facility] transfer. Patient's mother verbalizes consent for transfer. Report given to Morrie Sheldon, [place].

## 2020-01-07 NOTE — ED Provider Notes (Signed)
[facility] EMERGENCY DEPARTMENT Provider Note   CSN: [id number] Arrival date & time: 01/07/20  1727     History Chief Complaint  Patient presents with  . Hypotension  . Altered Mental Status   Level 5 caveat due to altered mental status  Joel Peterson is a 19 y.o. male with history of liver cirrhosis, sickle cell trait, esophageal varices, status post drug transplant with chronic severe rejection presents brought in by EMS for evaluation of acute onset, progressively worsening weakness and altered mental status for 6 days.  Mother is at the bedside and provides supplemental history.  She reports that for the last 6 days he has been progressively more agitated, decreased appetite and oral intake.  Over the last 2 days she reports that he has not been making much sense and has been less responsive, sleeping more.  2 days ago had an episode of nonbloody nonbilious emesis but has not had much to eat or drink since then and has had no further vomiting.  Had a bowel movement yesterday which was loose but nonbloody.  Urine has been dark.  Reports that he has not been eating or drinking as much.  Mother notes that at a recent admission at [facility] he was started on spironolactone and Lasix for ascites and peripheral edema.  She states that she held these medications when his symptoms began 6 days ago as his blood pressures were low and he appeared euvolemic to her.  The patient has been refusing visit as the ER for IV fluids and further evaluation, but today mother was able to convince patient to come in to the ED.    The history is provided by a parent and medical records.       Past Medical History:  Diagnosis Date  . Cirrhosis (HCC)   . Depression   . Eosinophilic esophagitis   . Esophageal varices with bleeding (HCC)   . Seasonal allergies   . Sickle cell trait (HCC)   . Status post liver transplant (HCC)   . Suicide attempt by drug ingestion (HCC)   .  Transaminitis   . Varices, esophageal [facility])     Patient Active Problem List   Diagnosis Date Noted  . Immunocompromised (HCC) 01/11/2019  . Fever of unknown origin 01/11/2019  . Electrolyte disturbance 02/06/2018  . Ingestion of substance 01/07/2018  . Acute rejection of liver transplant (HCC) 08/20/2017  . History of suicide attempt 03/30/2017  . Liver transplant recipient [facility])   . Urinary retention   . Overdose 01/20/2017  . Severe episode of recurrent major depressive disorder, without psychotic features (HCC)   . Suicide attempt (HCC)   . Eosinophilic esophagitis 09/24/2016  . Status post liver transplant (HCC) 09/22/2016  . Adjustment disorder with anxious mood 05/20/2016  . Anemia 05/12/2016  . Hypotension due to blood loss 05/12/2016  . Bleeding esophageal varices (HCC)   . Lactic acidosis   . Cirrhosis (HCC) 05/04/2016  . Hepatic cirrhosis (HCC) 05/03/2016  . Hematemesis 05/02/2016  . Orthostatic hypotension 05/02/2016  . Transaminitis 05/02/2016  . Depression 03/19/2016  . Sickle cell trait (HCC) 06/08/2014  . BMI (body mass index), pediatric, 5% to less than 85% for age 50/16/2015  . Failed vision screen 05/25/2014  . Acne 05/25/2014  . Allergic rhinitis 02/26/2014    Past Surgical History:  Procedure Laterality Date  . esophageal banding    . LIVER TRANSPLANT    . TIPS PROCEDURE  Family History  Problem Relation Age of Onset  . Hypertension Maternal Grandmother   . Diabetes Maternal Grandmother   . Hypertension Maternal Grandfather   . Diabetes Maternal Grandfather   . Diabetes Paternal Grandmother   . Hypertension Paternal Grandmother   . Diabetes Paternal Grandfather   . Hypertension Paternal Grandfather     Social History   Tobacco Use  . Smoking status: Never Smoker  . Smokeless tobacco: Never Used  Substance Use Topics  . Alcohol use: No    Alcohol/week: 0.0 standard drinks  . Drug use: No    Home Medications Prior to  Admission medications   Medication Sig Start Date End Date Taking? Authorizing Provider  acetaminophen (TYLENOL) 325 MG tablet Take 650 mg by mouth every 6 (six) hours as needed for fever.   Yes [provider]  albuterol (VENTOLIN HFA) 108 (90 Base) MCG/ACT inhaler Inhale 2 puffs into the lungs every 4 (four) hours as needed. 12/01/19  Yes [provider]  benzonatate (TESSALON) 100 MG capsule Take 100 mg by mouth 3 (three) times daily as needed. 11/24/19  Yes [provider]  calcium carbonate (TUMS - DOSED IN MG ELEMENTAL CALCIUM) 500 MG chewable tablet Chew 600 mg by mouth 3 (three) times daily. 12/22/19 01/21/20 Yes [provider]  magnesium oxide (MAG-OX) 400 MG tablet Take by mouth. 09/09/19 01/07/20 Yes [provider]  morphine 10 MG/5ML solution Take 5 mLs by mouth every 4 (four) hours as needed. 01/06/20  Yes [provider]  mycophenolate (MYFORTIC) 360 MG TBEC EC tablet Take 1,080 mg by mouth 2 (two) times daily.    Yes [provider]  ondansetron (ZOFRAN-ODT) 4 MG disintegrating tablet Take by mouth. 09/07/19  Yes [provider]  pantoprazole (PROTONIX) 40 MG tablet Take 1 tablet (40 mg total) by mouth daily. 05/02/16  Yes Janell Quiet, MD  rifaximin (XIFAXAN) 550 MG TABS tablet Take 550 mg by mouth 2 (two) times daily.  09/29/19  Yes [provider]  tacrolimus (PROGRAF) 1 MG capsule Take 0.5 mg by mouth 2 (two) times daily.    Yes [provider]  valGANciclovir (VALCYTE) 450 MG tablet Take 450 mg by mouth daily. 11/08/19 02/05/20 Yes [provider]  cetirizine (ZYRTEC) 10 MG tablet Take 1 tablet (10 mg total) by mouth daily as needed for allergies. 03/04/19   Ettefagh, Paul Dykes, MD  cholestyramine light (PREVALITE) 4 g packet Take 1 packet (4 g total) by mouth 2 (two) times daily. Patient not taking: Reported on 01/19/2017 08/08/16   Junius Creamer, NP  fluticasone [facility]) 50  MCG/ACT nasal spray Place 1-2 sprays into both nostrils daily. 03/04/19   Ettefagh, Paul Dykes, MD  glucose blood (PRECISION QID TEST) test strip Use to check blood sugar four times daily as directed. 09/07/19   [provider]  Insulin Syringe-Needle U-100 (BD INSULIN SYRINGE U/F 1/2UNIT) 31G X 5/16" 0.3 ML MISC Use to inject insulins up to 6 times daily as directed. 09/07/19   [provider]  ranitidine (ZANTAC) 150 MG tablet Take 1 tablet (150 mg total) by mouth at bedtime. Patient not taking: Reported on 01/19/2017 05/02/16   Janell Quiet, MD  sucralfate (CARAFATE) 1 g tablet Take 1 tablet (1 g total) by mouth 4 (four) times daily -  with meals and at bedtime. Patient not taking: Reported on 01/19/2017 05/02/16   Janell Quiet, MD    Allergies    Pollen extract  Review of  Systems   Review of Systems  Unable to perform ROS: Mental status change    Physical Exam Updated Vital Signs BP 104/63   Pulse (!) 108   Temp 100.2 F (37.9 C)   Resp 18   Ht 5\' 7"  (1.702 m)   Wt 43.1 kg   SpO2 100%   BMI 14.88 kg/m   Physical Exam Vitals and nursing note reviewed.  Constitutional:      Appearance: He is well-developed. He is ill-appearing.     Comments: Thin, chronically ill in appearance  HENT:     Head: Normocephalic and atraumatic.  Eyes:     General: Scleral icterus present.        Right eye: No discharge.        Left eye: No discharge.     Comments: Pupils pinpoint, minimally reactive to light  Neck:     Vascular: No JVD.     Trachea: No tracheal deviation.  Cardiovascular:     Rate and Rhythm: Regular rhythm. Tachycardia present.  Pulmonary:     Effort: Pulmonary effort is normal.     Breath sounds: Normal breath sounds.  Abdominal:     General: Abdomen is flat. A surgical scar is present. Bowel sounds are decreased. There is no distension.     Palpations: Abdomen is soft.     Tenderness: There is no abdominal tenderness.  Skin:     Coloration: Skin is jaundiced.     Findings: No erythema.  Neurological:     Mental Status: He is lethargic.     GCS: GCS eye subscore is 1. GCS verbal subscore is 2. GCS motor subscore is 1.     Comments: occasionally grunts in response to painful stimuli  Psychiatric:        Behavior: Behavior normal.     ED Results / Procedures / Treatments   Labs (all labs ordered are listed, but only abnormal results are displayed) Labs Reviewed  COMPREHENSIVE METABOLIC PANEL - Abnormal; Notable for the following components:      Result Value   Sodium 123 (*)    CO2 9 (*)    Glucose, Bld 174 (*)    BUN 49 (*)    Creatinine, Ser 2.56 (*)    Total Protein 5.0 (*)    Albumin 2.4 (*)    AST 694 (*)    ALT 617 (*)    Alkaline Phosphatase 1,641 (*)    Total Bilirubin 46.9 (*)    GFR calc non Af Amer 35 (*)    GFR calc Af Amer 40 (*)    All other components within normal limits  CBC WITH DIFFERENTIAL/PLATELET - Abnormal; Notable for the following components:   WBC 15.5 (*)    RBC 2.92 (*)    Hemoglobin 8.5 (*)    HCT 23.5 (*)    MCHC 36.2 (*)    RDW 27.1 (*)    Platelets 121 (*)    Neutro Abs 14.3 (*)    Lymphs Abs 0.6 (*)    All other components within normal limits  APTT - Abnormal; Notable for the following components:   aPTT 44 (*)    All other components within normal limits  PROTIME-INR - Abnormal; Notable for the following components:   Prothrombin Time 22.4 (*)    INR 2.0 (*)    All other components within normal limits  URINALYSIS, ROUTINE W REFLEX MICROSCOPIC - Abnormal; Notable for the following components:   Color, Urine AMBER (*)  APPearance HAZY (*)    Bilirubin Urine MODERATE (*)    All other components within normal limits  AMMONIA - Abnormal; Notable for the following components:   Ammonia 49 (*)    All other components within normal limits  CBG MONITORING, ED - Abnormal; Notable for the following components:   Glucose-Capillary 170 (*)    All other  components within normal limits  RESPIRATORY PANEL BY RT PCR (FLU A&B, COVID)  CULTURE, BLOOD (ROUTINE X 2)  CULTURE, BLOOD (ROUTINE X 2)  URINE CULTURE  LACTIC ACID, PLASMA  LACTIC ACID, PLASMA  BASIC METABOLIC PANEL    EKG EKG Interpretation  Date/Time:  Saturday January 07 2020 17:34:43 EST Ventricular Rate:  111 PR Interval:    QRS Duration: 77 QT Interval:  343 QTC Calculation: 467 R Axis:   88 Text Interpretation: Sinus tachycardia Consider right atrial enlargement Borderline Q waves in lateral leads Abnormal ECG Confirmed by Gerhard Munch [phone]) on 01/07/2020 5:40:28 PM   Radiology DG Chest Port 1 View  Result Date: 01/07/2020 CLINICAL DATA:  19 year old male with fever and altered mental status. EXAM: PORTABLE CHEST 1 VIEW COMPARISON:  Chest radiograph dated 12/10/2019. FINDINGS: There is mild eventration of the left hemidiaphragm. No focal consolidation, pleural effusion, pneumothorax. The cardiac silhouette is within normal limits. No acute osseous pathology. IMPRESSION: No active disease. Electronically Signed   By: Elgie Collard M.D.   On: 01/07/2020 18:28    Procedures .Critical Care Performed by: Jeanie Sewer, PA-C Authorized by: Jeanie Sewer, PA-C   Critical care provider statement:    Critical care time (minutes):  60   Critical care was necessary to treat or prevent imminent or life-threatening deterioration of the following conditions:  Sepsis   Critical care was time spent personally by me on the following activities:  Discussions with consultants, evaluation of patient's response to treatment, examination of patient, ordering and performing treatments and interventions, ordering and review of laboratory studies, ordering and review of radiographic studies, pulse oximetry, re-evaluation of patient's condition, obtaining history from patient or surrogate and review of old charts   (including critical care time)  Medications Ordered in ED Medications   vancomycin variable dose per unstable renal function (pharmacist dosing) (has no administration in time range)  ceFEPIme (MAXIPIME) 2 g in sodium chloride 0.9 % 100 mL IVPB (has no administration in time range)  norepinephrine (LEVOPHED) 4mg  in premix infusion (6 mcg/min Intravenous Transfusing/Transfer 01/07/20 2258)  lactated ringers bolus 1,000 mL (0 mLs Intravenous Stopped 01/07/20 1928)    And  lactated ringers bolus 500 mL (0 mLs Intravenous Stopped 01/07/20 1928)  ceFEPIme (MAXIPIME) 2 g in sodium chloride 0.9 % 100 mL IVPB (0 g Intravenous Stopped 01/07/20 1919)  metroNIDAZOLE (FLAGYL) IVPB 500 mg (0 mg Intravenous Stopped 01/07/20 1938)  vancomycin (VANCOCIN) IVPB 1000 mg/200 mL premix (0 mg Intravenous Stopped 01/07/20 2027)  sodium chloride 0.9 % bolus 500 mL (0 mLs Intravenous Stopped 01/07/20 2054)    ED Course  I have reviewed the triage vital signs and the nursing notes.  Pertinent labs & imaging results that were available during my care of the patient were reviewed by me and considered in my medical decision making (see chart for details).    MDM Rules/Calculators/A&P                      Patient returns today for evaluation of altered mental status, decreased oral intake.  He is brought in  by his mother who is primary caregiver.  He has a history of liver cirrhosis, esophageal varices.  He is status post liver transplant but has chronic severe rejection.  He is followed by the transplant team at [facility].  He presents today critically ill, febrile, tachycardic, and hypotensive.  He is minimally responsive to painful stimuli but protecting his airway.  He was given broad-spectrum antibiotics, 30 cc/kg IV fluid bolus.  His abdomen is soft, nondistended, not suggestive of SBP.  His chest x-ray is negative and his UA is not suggestive of UTI or nephrolithiasis.  Lab work reviewed and interpreted by myself shows leukocytosis of 15.5, anemia of 8.5 down 2 g from the lab work that was  done 4 days ago.  His LFTs are unsurprisingly elevated.  He is hyponatremic with a sodium of 125, potassium is within normal limits which is improved from lab work a few days ago.  However his kidney function is little bit worse with creatinine of 2.56, BUN 49.  His ammonia is elevated today at 49.  He had temporary improvement in his blood pressures with 30 cc/kg fluid bolus and was given another 500 cc but remains hypotensive.  Will start on peripheral Levophed.   Spoke with Dr. Jorene Minors; PICU doctor at [facility] who agrees to assume care of patient.  Mother is agreeable to transfer.  Patient did have improvement in his blood pressures with initiation of peripheral pressors.  O2 saturations are stable and he is not tachypneic.  He is maintaining his airway at this time.  Patient seen and evaluated by Dr. Jeraldine Loots who agrees with assessment and plan at this time.   Final Clinical Impression(s) / ED Diagnoses Final diagnoses:  Severe sepsis with septic shock [facility])    Rx / DC Orders ED Discharge Orders    None       Bennye Alm 01/07/20 2341    Gerhard Munch, MD 01/09/20 1527

## 2020-01-07 NOTE — ED Notes (Signed)
Call [facility] for transplant DR to [place] [facility]

## 2020-01-07 NOTE — Progress Notes (Signed)
Pharmacy Antibiotic Note  Joel Peterson is a 19 y.o. male admitted on 01/07/2020 with sepsis.  Pharmacy has been consulted for Cefepime and Vancomycin dosing.  Height: 5\' 7"  (170.2 cm) Weight: 95 lb (43.1 kg) IBW/kg (Calculated) : 66.1  Temp (24hrs), Avg:100.2 F (37.9 C), Min:100.2 F (37.9 C), Max:100.2 F (37.9 C)  Recent Labs  Lab 01/07/20 1819 01/07/20 1843  WBC 15.5*  --   CREATININE 2.56*  --   LATICACIDVEN  --  1.5    Estimated Creatinine Clearance: 28.3 mL/min (A) (by C-G formula based on SCr of 2.56 mg/dL (H)).    Allergies  Allergen Reactions  . Pollen Extract Rash    Pt has seasonal allergies that cause excessive sneezing and running nose- 01/09/20    Antimicrobials this admission: 2/27 Cefepime >>  2/27 Vancomcyin >>   Dose adjustments this admission:   Microbiology results: Pending   Plan:  - Cefepime 2g IV q24h  - Vancomycin 1000mg  IV x 1 dose  - Will dose vancomycin based on levels due to renal function  - Monitor patient's renal function and urine output  Thank you for allowing pharmacy to be a part of this patient's care.  3/27 PharmD. BCPS  01/07/2020 7:34 PM

## 2020-01-07 NOTE — Progress Notes (Signed)
Notified bedside nurse of need to administer 1662 cc fluid per protocal.

## 2020-01-07 NOTE — ED Notes (Signed)
Pt unable to sign for transfer consent. Patient's mother verbalizes consent for transfer.

## 2020-01-07 NOTE — ED Triage Notes (Signed)
Pt bib [facility] from home w/ c/o altered mental status and hypotension. Pt has hx of liver failure, with liver transplant in 2017. Confusion and lethargy has been increasingly worse since Sunday, AOx4 at baseline. Mother states pt is on hospice, and prescribed morphine and did receive that earlier today. Pt unable to respond to questions at this time, incomprehensible sounds. EMS VS:  RR 24 SPO2 100% on RA BP 80/42 TEMP: 100.0 F tympanic CBG 217 HR 116.

## 2020-01-07 NOTE — Progress Notes (Signed)
[facility] Documentation  Pt is an active pt with [facility]. Parents called 911 due to AMS. Pt was not responding to their voice.   Liaison will follow pt in ED course and if pt is admitted.   Please do not hesitate to call with any questions.   Thank you,  Trena Platt, RN [facility] Liaison [phone]

## 2020-01-08 ENCOUNTER — Ambulatory Visit
Admit: 2020-01-08 | Discharge: 2020-01-11 | Disposition: A | Discharge status: Hospice | Payer: PRIVATE HEALTH INSURANCE | Source: Other Acute Inpatient Hospital | Admitting: Pediatric Critical Care Medicine

## 2020-01-08 LAB — URINE CULTURE: Culture: NO GROWTH

## 2020-01-08 LAB — COMPREHENSIVE METABOLIC PANEL
ALBUMIN: 1.9 g/dL — ABNORMAL LOW (ref 3.5–5.0)
ALKALINE PHOSPHATASE: 1031 U/L — ABNORMAL HIGH (ref 65–260)
AST (SGOT): 566 U/L — ABNORMAL HIGH (ref 19–55)
BILIRUBIN TOTAL: 36.1 mg/dL — ABNORMAL HIGH (ref 0.0–1.2)
BLOOD UREA NITROGEN: 37 mg/dL — ABNORMAL HIGH (ref 7–21)
BUN / CREAT RATIO: 22
CALCIUM: 7.8 mg/dL — ABNORMAL LOW (ref 8.5–10.2)
CREATININE: 1.72 mg/dL — ABNORMAL HIGH (ref 0.70–1.30)
EGFR CKD-EPI AA MALE: 65 mL/min/{1.73_m2} (ref >=60)
EGFR CKD-EPI NON-AA MALE: 56 mL/min/{1.73_m2} — ABNORMAL LOW (ref >=60)
GLUCOSE RANDOM: 142 mg/dL (ref 70–179)
POTASSIUM: 3.8 mmol/L (ref 3.5–5.0)
SODIUM: 130 mmol/L — ABNORMAL LOW (ref 135–145)

## 2020-01-08 LAB — SLIDE REVIEW

## 2020-01-08 LAB — BLOOD GAS CRITICAL CARE PANEL, ARTERIAL
BASE EXCESS ARTERIAL: -13.5 — ABNORMAL LOW (ref -2.0–2.0)
BASE EXCESS ARTERIAL: -12.3 — ABNORMAL LOW (ref -2.0–2.0)
CALCIUM IONIZED ARTERIAL (MG/DL): 4.39 mg/dL — ABNORMAL LOW (ref 4.40–5.40)
CALCIUM IONIZED ARTERIAL (MG/DL): 4.21 mg/dL — ABNORMAL LOW (ref 4.40–5.40)
GLUCOSE WHOLE BLOOD: 226 mg/dL — ABNORMAL HIGH (ref 70–179)
GLUCOSE WHOLE BLOOD: 211 mg/dL — ABNORMAL HIGH (ref 70–179)
HCO3 ARTERIAL: 11 mmol/L — ABNORMAL LOW (ref 22–27)
HCO3 ARTERIAL: 13 mmol/L — ABNORMAL LOW (ref 22–27)
HEMOGLOBIN BLOOD GAS: 6.6 g/dL — ABNORMAL LOW (ref 13.50–17.50)
HEMOGLOBIN BLOOD GAS: 5.4 g/dL — ABNORMAL LOW (ref 13.50–17.50)
LACTATE BLOOD ARTERIAL: 3.6 mmol/L — ABNORMAL HIGH (ref <1.3)
LACTATE BLOOD ARTERIAL: 2.8 mmol/L — ABNORMAL HIGH (ref <1.3)
O2 SATURATION ARTERIAL: 99.7 % (ref 94.0–100.0)
O2 SATURATION ARTERIAL: 98.9 % (ref 94.0–100.0)
PH ARTERIAL: 7.36 (ref 7.35–7.45)
PH ARTERIAL: 7.28 — ABNORMAL LOW (ref 7.35–7.45)
PO2 ARTERIAL: 148 mmHg — ABNORMAL HIGH (ref 80.0–110.0)
POTASSIUM WHOLE BLOOD: 3.3 mmol/L — ABNORMAL LOW (ref 3.4–4.6)
SODIUM WHOLE BLOOD: 135 mmol/L (ref 135–145)
SODIUM WHOLE BLOOD: 141 mmol/L (ref 135–145)

## 2020-01-08 LAB — BASIC METABOLIC PANEL
BLOOD UREA NITROGEN: 38 mg/dL — ABNORMAL HIGH (ref 7–21)
BLOOD UREA NITROGEN: 39 mg/dL — ABNORMAL HIGH (ref 7–21)
BUN / CREAT RATIO: 19
CALCIUM: 8 mg/dL — ABNORMAL LOW (ref 8.5–10.2)
CALCIUM: 7.8 mg/dL — ABNORMAL LOW (ref 8.5–10.2)
CHLORIDE: 110 mmol/L — ABNORMAL HIGH (ref 98–107)
CHLORIDE: 111 mmol/L — ABNORMAL HIGH (ref 98–107)
CREATININE: 1.83 mg/dL — ABNORMAL HIGH (ref 0.70–1.30)
CREATININE: 2.05 mg/dL — ABNORMAL HIGH (ref 0.70–1.30)
EGFR CKD-EPI AA MALE: 60 mL/min/{1.73_m2} (ref >=60)
EGFR CKD-EPI AA MALE: 53 mL/min/{1.73_m2} — ABNORMAL LOW (ref >=60)
EGFR CKD-EPI NON-AA MALE: 52 mL/min/{1.73_m2} — ABNORMAL LOW (ref >=60)
EGFR CKD-EPI NON-AA MALE: 46 mL/min/{1.73_m2} — ABNORMAL LOW (ref >=60)
GLUCOSE RANDOM: 247 mg/dL — ABNORMAL HIGH (ref 70–99)
GLUCOSE RANDOM: 208 mg/dL — ABNORMAL HIGH (ref 70–99)
POTASSIUM: 4 mmol/L (ref 3.5–5.0)
POTASSIUM: 3.5 mmol/L (ref 3.5–5.0)
SODIUM: 133 mmol/L — ABNORMAL LOW (ref 135–145)

## 2020-01-08 LAB — BLOOD GAS CRITICAL CARE PANEL, VENOUS
BASE EXCESS VENOUS: -13.5 — ABNORMAL LOW (ref -2.0–2.0)
CALCIUM IONIZED VENOUS (MG/DL): 4.96 mg/dL (ref 4.40–5.40)
CALCIUM IONIZED VENOUS (MG/DL): 4.77 mg/dL (ref 4.40–5.40)
GLUCOSE WHOLE BLOOD: 202 mg/dL — ABNORMAL HIGH (ref 70–179)
GLUCOSE WHOLE BLOOD: 279 mg/dL — ABNORMAL HIGH (ref 70–179)
HCO3 VENOUS: 12 mmol/L — ABNORMAL LOW (ref 22–27)
HCO3 VENOUS: 13 mmol/L — ABNORMAL LOW (ref 22–27)
HEMOGLOBIN BLOOD GAS: 8.2 g/dL — ABNORMAL LOW (ref 13.50–17.50)
LACTATE BLOOD VENOUS: 2.4 mmol/L — ABNORMAL HIGH (ref 0.5–1.8)
LACTATE BLOOD VENOUS: 2.8 mmol/L — ABNORMAL HIGH (ref 0.5–1.8)
O2 SATURATION VENOUS: 73.4 % (ref 40.0–85.0)
PCO2 VENOUS: 27 mmHg — ABNORMAL LOW (ref 40–60)
PCO2 VENOUS: 29 mmHg — ABNORMAL LOW (ref 40–60)
PH VENOUS: 7.27 — ABNORMAL LOW (ref 7.32–7.43)
PH VENOUS: 7.28 — ABNORMAL LOW (ref 7.32–7.43)
PO2 VENOUS: 45 mmHg (ref 30–55)
PO2 VENOUS: 38 mmHg (ref 30–55)
POTASSIUM WHOLE BLOOD: 4 mmol/L (ref 3.4–4.6)
SODIUM WHOLE BLOOD: 134 mmol/L — ABNORMAL LOW (ref 135–145)

## 2020-01-08 LAB — CBC W/ AUTO DIFF
BASOPHILS ABSOLUTE COUNT: 0.2 10*9/L — ABNORMAL HIGH (ref 0.0–0.1)
BASOPHILS ABSOLUTE COUNT: 0.2 10*9/L — ABNORMAL HIGH (ref 0.0–0.1)
BASOPHILS RELATIVE PERCENT: 1 %
EOSINOPHILS ABSOLUTE COUNT: 0.1 10*9/L (ref 0.0–0.4)
EOSINOPHILS ABSOLUTE COUNT: 0 10*9/L (ref 0.0–0.4)
EOSINOPHILS RELATIVE PERCENT: 0.4 %
EOSINOPHILS RELATIVE PERCENT: 0.1 %
HEMATOCRIT: 24.1 % — ABNORMAL LOW (ref 41.0–53.0)
HEMATOCRIT: 22.9 % — ABNORMAL LOW (ref 41.0–53.0)
HEMOGLOBIN: 7.8 g/dL — ABNORMAL LOW (ref 13.5–17.5)
HEMOGLOBIN: 7.3 g/dL — ABNORMAL LOW (ref 13.5–17.5)
LARGE UNSTAINED CELLS: 1 % (ref 0–4)
LARGE UNSTAINED CELLS: 1 % (ref 0–4)
LYMPHOCYTES ABSOLUTE COUNT: 0.8 10*9/L — ABNORMAL LOW (ref 1.5–5.0)
LYMPHOCYTES ABSOLUTE COUNT: 0.5 10*9/L — ABNORMAL LOW (ref 1.5–5.0)
LYMPHOCYTES RELATIVE PERCENT: 5.3 %
LYMPHOCYTES RELATIVE PERCENT: 2.2 %
MEAN CORPUSCULAR HEMOGLOBIN CONC: 32.5 g/dL (ref 31.0–37.0)
MEAN CORPUSCULAR HEMOGLOBIN CONC: 31.9 g/dL (ref 31.0–37.0)
MEAN CORPUSCULAR HEMOGLOBIN: 28.1 pg (ref 26.0–34.0)
MEAN CORPUSCULAR HEMOGLOBIN: 27.2 pg (ref 26.0–34.0)
MEAN CORPUSCULAR VOLUME: 85.2 fL (ref 80.0–100.0)
MEAN PLATELET VOLUME: 6.1 fL — ABNORMAL LOW (ref 7.0–10.0)
MONOCYTES ABSOLUTE COUNT: 1.2 10*9/L — ABNORMAL HIGH (ref 0.2–0.8)
MONOCYTES RELATIVE PERCENT: 9.1 %
MONOCYTES RELATIVE PERCENT: 5.5 %
NEUTROPHILS ABSOLUTE COUNT: 12.6 10*9/L — ABNORMAL HIGH (ref 2.0–7.5)
NEUTROPHILS ABSOLUTE COUNT: 20.4 10*9/L — ABNORMAL HIGH (ref 2.0–7.5)
NEUTROPHILS RELATIVE PERCENT: 83.7 %
NEUTROPHILS RELATIVE PERCENT: 91 %
NUCLEATED RED BLOOD CELLS: 1 /100{WBCs} (ref <=4)
PLATELET COUNT: 81 10*9/L — ABNORMAL LOW (ref 150–440)
PLATELET COUNT: 165 10*9/L (ref 150–440)
RED BLOOD CELL COUNT: 2.79 10*12/L — ABNORMAL LOW (ref 4.50–5.90)
RED BLOOD CELL COUNT: 2.69 10*12/L — ABNORMAL LOW (ref 4.50–5.90)
RED CELL DISTRIBUTION WIDTH: 25.3 % — ABNORMAL HIGH (ref 12.0–15.0)
RED CELL DISTRIBUTION WIDTH: 25.7 % — ABNORMAL HIGH (ref 12.0–15.0)
WBC ADJUSTED: 15 10*9/L — ABNORMAL HIGH (ref 4.5–11.0)
WBC ADJUSTED: 22.4 10*9/L — ABNORMAL HIGH (ref 4.5–11.0)

## 2020-01-08 LAB — MAGNESIUM
Magnesium:MCnc:Pt:Ser/Plas:Qn:: 2
Magnesium:MCnc:Pt:Ser/Plas:Qn:: 2
Magnesium:MCnc:Pt:Ser/Plas:Qn:: 2

## 2020-01-08 LAB — PROTIME
Coagulation tissue factor induced:Time:Pt:PPP:Qn:Coag: 26.7 — ABNORMAL HIGH
Coagulation tissue factor induced:Time:Pt:PPP:Qn:Coag: 33.9 — ABNORMAL HIGH

## 2020-01-08 LAB — D-DIMER, QUANTITATIVE: D-DIMER QUANTITATIVE (CH,ML,PD,ET): 366 ng/mL — ABNORMAL HIGH

## 2020-01-08 LAB — SMEAR REVIEW

## 2020-01-08 LAB — HEPATIC FUNCTION PANEL
ALBUMIN: 2.6 g/dL — ABNORMAL LOW (ref 3.5–5.0)
ALKALINE PHOSPHATASE: 1362 U/L — ABNORMAL HIGH (ref 65–260)
AST (SGOT): 693 U/L — ABNORMAL HIGH (ref 19–55)
BILIRUBIN TOTAL: 38.4 mg/dL — ABNORMAL HIGH (ref 0.0–1.2)
BILIRUBIN TOTAL: 38.9 mg/dL — ABNORMAL HIGH (ref 0.0–1.2)

## 2020-01-08 LAB — CALCIUM IONIZED ARTERIAL (MG/DL): Calcium.ionized:MCnc:Pt:Bld:Qn:: 4.39 — ABNORMAL LOW

## 2020-01-08 LAB — ALBUMIN: Albumin:MCnc:Pt:Ser/Plas:Qn:: 2.6 — ABNORMAL LOW

## 2020-01-08 LAB — BUN / CREAT RATIO: Urea nitrogen/Creatinine:MRto:Pt:Ser/Plas:Qn:: 19

## 2020-01-08 LAB — D-DIMER QUANTITATIVE (CH,ML,PD,ET)
Fibrin D-dimer DDU:MCnc:Pt:PPP:Qn:: 366 — ABNORMAL HIGH
Fibrin D-dimer DDU:MCnc:Pt:PPP:Qn:: 396 — ABNORMAL HIGH

## 2020-01-08 LAB — APTT
APTT: 43.7 s — ABNORMAL HIGH (ref 25.3–37.1)
APTT: 52.8 s — ABNORMAL HIGH (ref 25.3–37.1)
HEPARIN CORRELATION: 0.3

## 2020-01-08 LAB — WBC ADJUSTED: Leukocytes:NCnc:Pt:Bld:Qn:: 22.4 — ABNORMAL HIGH

## 2020-01-08 LAB — PH ARTERIAL: pH:LsCnc:Pt:BldA:Qn:: 7.28 — ABNORMAL LOW

## 2020-01-08 LAB — PHOSPHORUS
Phosphate:MCnc:Pt:Ser/Plas:Qn:: 3.5
Phosphate:MCnc:Pt:Ser/Plas:Qn:: 4.2
Phosphate:MCnc:Pt:Ser/Plas:Qn:: 5.1 — ABNORMAL HIGH

## 2020-01-08 LAB — HEPARIN CORRELATION
Lab: 0.2
Lab: 0.3

## 2020-01-08 LAB — VANCOMYCIN RANDOM: Vancomycin^random:MCnc:Pt:Ser/Plas:Qn:: 13.8

## 2020-01-08 LAB — CALCIUM IONIZED VENOUS (MG/DL): Calcium.ionized:MCnc:Pt:Bld:Qn:: 4.77

## 2020-01-08 LAB — BILIRUBIN TOTAL: Bilirubin:MCnc:Pt:Ser/Plas:Qn:: 38.4 — ABNORMAL HIGH

## 2020-01-08 LAB — BLOOD UREA NITROGEN: Urea nitrogen:MCnc:Pt:Ser/Plas:Qn:: 38 — ABNORMAL HIGH

## 2020-01-08 LAB — CHLORIDE: Chloride:SCnc:Pt:Ser/Plas:Qn:: 110 — ABNORMAL HIGH

## 2020-01-08 LAB — MEAN CORPUSCULAR HEMOGLOBIN CONC: Erythrocyte mean corpuscular hemoglobin concentration:MCnc:Pt:RBC:Qn:Automated count: 32.5

## 2020-01-08 LAB — POTASSIUM WHOLE BLOOD: Potassium:SCnc:Pt:Bld:Qn:: 3.7

## 2020-01-08 LAB — TARGET CELLS

## 2020-01-08 LAB — FIBRINOGEN LEVEL
Fibrinogen:MCnc:Pt:PPP:Qn:Coag: 198
Fibrinogen:MCnc:Pt:PPP:Qn:Coag: 226

## 2020-01-08 LAB — AMMONIA: Ammonia:SCnc:Pt:Plas:Qn:: 13

## 2020-01-08 NOTE — Unmapped (Signed)
PICU History and Physical    Assessment and Plan     [name] Gross is a 19 y.o. 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 He is currently stable on RA s/p multiple bolus for fluid resuscitation and on norepi. Will continue antibiotics at this time adjusted for ARF/CLF with close monitoring of labs. Requires care in the PICU for close neuro monitoring and treatment of likely septic shock.    RESP:   - SORA  - CXR  - VBG q6hr    CV:   - CRM  - Norepi for MAP >65    Renal: Creatine 1.72 here, pharm calculated EGFR 34 based on OSH labs  - Strict I/Os  - Chem 10 q8hr    FEN/GI: follows with Dr. Melrose Nakayama   - D5NS at mIVF  - NPO  - Ped GI consulted, following  - HFP daily    HEME:   - Consider consult Heme/Onc in AM given increased INR  - DIC panel daily    ID:   - Continue cefepime/flagyl with renal/hepatic dosing  - S/p vanc dose at OSH; f/u AM level  - F/u BCx from OSH  - CBC daily    ENDO:   - Given NPO status, will give methylpred in place of prednisone; will order now given unknown time of last dose  - Consider stress dose steroids if clinical decompensates    NEURO:   - q2hr Neuro checks  - Morphine 2mg  q2hr  - Avoid tylenol/nsaids given acute renal and chronic liver failure  - PT/OT consult    Social:  - Consider SCT consult    Access: PIV    Dispo: Inpatient in PICU for further management    Subjective:   Primary Care Provider: [name], MD  History provided by: mother  An interpreter was not used during the visit.   I have personally reviewed outside records.     CC: AMS    HPI: [name] Gross is a 20 y.o. 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 Mom states that he has been feeling worse for the past few days with increasing fatigue and weakness but refused to go to the hospital. He had follow-up with Dr. Melrose Nakayama 5 days ago where it was recommended he go to the ED due to worsening labs (hyponatremia, decreased bicarb, increased WBC), low BPs, poor PO, and generalized weakness. Mom finally convinced him today that he needed to go to the hospital because he was unresponsive and disoriented. Mom gave him some morphine earlier today due to pain which didn't seem to make a difference for him. He's also had a cough for a couple weeks but didn't complain of chest pain until today. He has also been unable to take his medications intermittently over the last few days due to poor PO. He has not had vomiting or nausea but just refuses to eat. He has been drinking and peeing though. No fevers, rhinorrhea, abdominal pain, diarrhea, rashes, or shortness of breath. He has been in hospice since November and has seen Supportive Care Team here during most admissions.  At [facility] ED they obtained 2 peripheral blood cultures, gave 2L NS, 2L LR, normal UA, UCx and labs which showed elevated WBC, hyponatremia, and a Creatinine bump from previous admission. They transferred to [facility] given poor mental status and concern for septic shock in the setting of soft BP, tachycardia and AMS. They started vanc, cefepime, and flagyl prior to transfer. He also received NaHCO3 as well.    PAST MEDICAL HISTORY:  Past Medical History:   Diagnosis Date   ??? 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 Depression    ??? MSSA bacteremia 08/27/2019   ??? Seasonal allergies    ??? Sickle cell trait (CMS-HCC)    ??? Strep Mitis Central line-associated bloodstream infection 08/27/2019       PAST SURGICAL HISTORY:  Past Surgical History:   Procedure Laterality Date   ??? FRENULECTOMY, LINGUAL     ??? PR ERCP REMOVE FOREIGN BODY/STENT BILIARY/PANC DUCT N/A 08/20/2017    Procedure: ENDOSCOPIC RETROGRADE CHOLANGIOPANCREATOGRAPHY (ERCP); W/ REMOVAL OF FOREIGN BODY/STENT FROM BILIARY/PANCREATIC DUCT(S);  Surgeon: Vonda Antigua, MD;  Location: GI PROCEDURES MEMORIAL [facility];  Service: Gastroenterology   ??? PR ERCP REMOVE FOREIGN BODY/STENT BILIARY/PANC DUCT N/A 06/04/2018    Procedure: ENDOSCOPIC RETROGRADE CHOLANGIOPANCREATOGRAPHY (ERCP); W/ REMOVAL OF FOREIGN BODY/STENT FROM BILIARY/PANCREATIC DUCT(S);  Surgeon: Chriss Driver, MD;  Location: GI PROCEDURES MEMORIAL [facility];  Service: Gastroenterology   ??? PR ERCP STENT PLACEMENT BILIARY/PANCREATIC DUCT N/A 11/21/2016    Procedure: ENDOSCOPIC RETROGRADE CHOLANGIOPANCREATOGRAPHY (ERCP); WITH PLACEMENT OF ENDOSCOPIC STENT INTO BILIARY OR PANCREATIC DUCT;  Surgeon: Vonda Antigua, MD;  Location: GI PROCEDURES MEMORIAL [facility];  Service: Gastroenterology   ??? PR ERCP,W/REMOVAL STONE,BIL/PANCR DUCTS N/A 08/20/2017    Procedure: ERCP; W/ENDOSCOPIC RETROGRADE REMOVAL OF CALCULUS/CALCULI FROM BILIARY &/OR PANCREATIC DUCTS;  Surgeon: Vonda Antigua, MD;  Location: GI PROCEDURES [facility];  Service: Gastroenterology   ??? PR ERCP,W/REMOVAL STONE,BIL/PANCR DUCTS N/A 06/04/2018    Procedure: ERCP; W/ENDOSCOPIC RETROGRADE REMOVAL OF CALCULUS/CALCULI FROM BILIARY &/OR PANCREATIC DUCTS;  Surgeon: Chriss Driver, MD;  Location: GI PROCEDURES MEMOR[facility];  Service: Gastroenterology   ??? PR TRANSPLANT LIVER,ALLOTRANSPLANT N/A 09/22/2016    Procedure: LIVER ALLOTRANSPLANTATION; ORTHOTOPIC, PARTIAL OR WHOLE, FROM CADAVER OR LIVING DONOR, ANY AGE;  Surgeon: Doyce Loose, MD;  Location: MAIN OR [facility];  Service: Transplant   ??? PR TRANSPLANT,PREP DONOR LIVER, WHOLE N/A 09/22/2016    Procedure: [facility] STD PREP CAD DONOR WHOLE LIVER GFT PRIOR TNSPLNT,INC CHOLE,DISS/REM SURR TISSU WO TRISEG/LOBE SPLT;  Surgeon: Doyce Loose, MD;  Location: MAIN OR [facility];  Service: Transplant   ??? PR UPGI ENDOSCOPY W/US FN BX N/A 08/20/2017    Procedure: UGI W/ TRANSENDOSCOPIC ULTRASOUND GUIDED INTRAMURAL/TRANSMURAL FINE NEEDLE ASPIRATION/BIOPSY(S), ESOPHAGUS;  Surgeon: Vonda Antigua, MD;  Location: GI PROCEDURES [facility]ORIAL [facility];  Service: Gastroenterology   ??? PR UPPER GI ENDOSCOPY,BIOPSY N/A 07/15/2016    Procedure: UGI ENDOSCOPY; WITH BIOPSY, SINGLE OR MULTIPLE;  Surgeon: Arnold Long Mir, MD;  Location: PEDS PROCEDURE ROOM [facility];  Service: Gastroenterology   ??? PR UPPER GI ENDOSCOPY,CTRL BLEED Left 05/05/2016    Procedure: UGI ENDOSCOPY; WITH CONTROL OF BLEEDING, ANY METHOD;  Surgeon: Arnold Long Mir, MD;  Location: PEDS PROCEDURE ROOM [facility];  Service: Gastroenterology   ??? PR UPPER GI ENDOSCOPY,CTRL BLEED N/A 05/12/2016    Procedure: UGI ENDOSCOPY; WITH CONTROL OF  BLEEDING, ANY METHOD;  Surgeon: Annie Paras, MD;  Location: CHILDRENS OR [facility];  Service: Gastroenterology   ??? PR UPPER GI ENDOSCOPY,DIAGNOSIS N/A 09/21/2019    Procedure: UGI ENDO, INCLUDE ESOPHAGUS, STOMACH, & DUODENUM &/OR JEJUNUM; DX W/WO COLLECTION SPECIMN, BY BRUSH OR WASH;  Surgeon: Monte Fantasia, MD;  Location: GI PROCEDURES MEMORIAL [facility];  Service: Gastroenterology   ??? PR UPPER GI ENDOSCOPY,DIAGNOSIS N/A 12/13/2019    Procedure: UGI ENDO, INCLUDE ESOPHAGUS, STOMACH, & DUODENUM &/OR JEJUNUM; DX W/WO COLLECTION SPECIMN, BY BRUSH OR WASH;  Surgeon: Neysa Hotter, MD;  Location: GI PROC[facility]URES [facility];  Service: Gastroenterology   ??? PR UPPER GI ENDOSCOPY,TUBE PLACE N/A 12/13/2019    Procedure: UGI ENDO; W/TRANSENDOSCOPIC TUBE/CATH PLCMT;  Surgeon: Neysa Hotter, MD;  Location: GI PROCEDURES MEMORIAL [facility];  Service: Gastroenterology FAMILY HISTORY:  Family History   Problem Relation Age of Onset   ??? Diabetes Maternal Grandmother    ??? Diabetes Paternal Grandmother    ??? Hypertension Maternal Grandfather    ??? Hypertension Paternal Grandfather    ??? Asthma Brother    ??? Anesthesia problems Neg Hx    ??? Melanoma Neg Hx    ??? Basal cell carcinoma Neg Hx    ??? Squamous cell carcinoma Neg Hx        SOCIAL HISTORY:   reports that he has never smoked. He has never used smokeless tobacco. He reports that he does not drink alcohol or use drugs.    ALLERGIES:  Bee pollen and Pollen extracts     MEDICATIONS:  Prior to Admission medications    Medication Sig Start Date End Date Taking? Authorizing Provider   albuterol HFA 90 mcg/actuation inhaler Inhale 2 puffs every four (4) hours as needed (intractable coughing). 11/30/19 11/29/20  Glorianne Manchester, MD   benzonatate (TESSALON) 100 MG capsule Take by mouth daily as needed. 11/24/19   Historical Provider, MD   calcium carbonate (TUMS) 200 mg calcium (500 mg) chewable tablet Chew 3 tablets (600 mg of elem calcium total) Three (3) times a day. 12/22/19 01/21/20  Murvin Donning, MD   clindamycin-benzoyl peroxide 1.2 %(1 % base) -5 % gel Apply to rash on chest  Patient taking differently: Apply 1 application topically nightly. Apply to rash on chest 09/08/19   Alfredia Ferguson, MD   diclofenac sodium (VOLTAREN) 1 % gel Apply 2 g topically two (2) times a day.  Patient not taking: Reported on 01/03/2020 11/14/19 11/13/20  Loni Beckwith, MD   ergocalciferol (DRISDOL) 1,250 mcg (50,000 unit) capsule Take 1 capsule (50,000 Units total) by mouth once a week for 3 doses. 12/28/19 01/13/20  Murvin Donning, MD   fluticasone propionate (FLOVENT HFA) 220 mcg/actuation inhaler 1 puff into mouth and swallow, twice a day.  Do not eat or drink for 30 minutes after taking.  Patient not taking: Reported on 01/03/2020 11/30/19   Glorianne Manchester, MD   furosemide (LASIX) 20 MG tablet Take 0.5 tablets (10 mg total) by mouth daily. 12/22/19 01/22/20  Murvin Donning, MD   hydrOXYzine (ATARAX) 25 MG tablet Take 1 tablet (25 mg total) by mouth every six (6) hours as needed. 12/22/19   Murvin Donning, MD   magnesium oxide (MAGOX) 400 mg (241.3 mg magnesium) tablet Take 1 tablet (400 mg total) by mouth Two (2) times a day. 01/05/20   Glorianne Manchester, MD   mirtazapine (REMERON) 7.5 MG tablet Take 1 tablet (7.5 mg total) by  mouth nightly. 12/22/19 02/20/20  Murvin Donning, MD   multivitamin, with zinc (AQUADEKS) 100-5 mcg-mg [place] 2 tablets daily. 11/08/19   Glorianne Manchester, MD   MVW Complete, pediatric multivit 61-D3-vit K, (MVW COMPLETE FORMULATION) 1,500-800 unit-mcg cap Take 1 capsule by mouth daily. 12/22/19   Murvin Donning, MD   mycophenolate (MYFORTIC) 180 MG EC tablet Take 3 tablets (540 mg total) by mouth two (2) times a day. 12/22/19 12/21/20  Murvin Donning, MD   ondansetron (ZOFRAN-ODT) 4 MG disintegrating tablet Take 1 tablet (4 mg total) by mouth every eight (8) hours as needed for up to 7 days. 09/07/19   Alfredia Ferguson, MD   pantoprazole (PROTONIX) 40 MG tablet Take 1 tablet (40 mg total) by mouth daily. 01/05/20   Glorianne Manchester, MD   predniSONE (DELTASONE) 20 MG tablet Take 1 tablet (20 mg total) by mouth daily. 12/22/19   Murvin Donning, MD   rifAXIMin [place]) 550 mg Tab Take 1 tablet (550 mg total) by mouth Two (2) times a day. 11/08/19   Glorianne Manchester, MD   spironolactone (ALDACTONE) 100 MG tablet Take 1 tablet (100 mg total) by mouth daily. 12/22/19 01/22/20  Murvin Donning, MD   sulfamethoxazole-trimethoprim (BACTRIM) 400-80 mg per tablet Take 1 tablet (80 mg of trimethoprim total) by mouth Every Monday, Wednesday, and Friday. 11/09/19 02/03/20  Glorianne Manchester, MD   tacrolimus (PROGRAF) 0.5 MG capsule Take 1 capsule (0.5 mg total) by mouth two (2) times a day. 12/22/19 12/21/20  Murvin Donning, MD   thiamine (B-1) 100 MG tablet Take 1 tablet (100 mg total) by mouth daily for 60 doses. 12/22/19 04/01/20  Murvin Donning, MD   valGANciclovir (VALCYTE) 450 mg tablet Take 1 tablet (450 mg total) by mouth daily. 11/08/19 02/05/20  Glorianne Manchester, MD       IMMUNIZATIONS:  Immunization History   Administered Date(s) Administered   ??? DTaP 06/12/2000, 10/13/2000, 01/06/2001, 08/16/2001, 06/24/2004   ??? HPV Quadrivalent (Gardasil) 04/17/2011, 05/27/2012, 05/11/2013   ??? Hepatitis A 09/11/2006, 01/23/2009   ??? Hepatitis B vaccine, pediatric/adolescent dosage, 01/22/2000, 04/24/2000, 01/06/2001   ??? HiB-PRP-OMP 06/12/2000, 10/13/2000, 01/06/2001, 05/18/2001   ??? Influenza LAIV (Nasal-Quad) 2-60yrs 09/14/2014   ??? Influenza Vaccine Quad (IIV4 PF) 89mo+ injectable 10/12/2017, 09/10/2019   ??? MMR 05/18/2001, 06/24/2004   ??? Meningococcal Conjugate MCV4P 04/17/2011, 05/22/2016   ??? PPD Test 05/17/2016   ??? Pneumococcal Conjugate 13-Valent 06/12/2000, 10/13/2000, 01/06/2001   ??? Poliovirus,inactivated (IPV) 06/12/2000, 10/13/2000, 05/18/2001, 06/24/2004   ??? TdaP 04/17/2011   ??? Varicella 05/18/2001, 07/24/2005, 09/11/2006     ROS:  The remainder of 10 systems reviewed were negative except as mentioned in the HPI.        Objective:           Vital signs:  Temp:  [37.9 ??C] 37.9 ??C  Heart Rate:  [118-120] 120  SpO2 Pulse:  [115-120] 120  Resp:  [14-17] 14  BP: (102-121)/(58-84) 121/84  MAP (mmHg):  [83-94] 94  SpO2:  [100 %] 100 %  Vitals:    01/08/20 0005   Weight: 51.8 kg (114 lb 3.2 oz)   , 1 %ile (Z= -2.21) based on CDC (Boys, 2-20 Years) weight-for-age data using vitals from 01/08/2020.  Ht Readings from Last 1 Encounters:   12/11/19 170.2 cm (5' 7.01) (18 %, Z= -0.92)*     * Growth percentiles are based on CDC (Boys, 2-20 Years) data.   ,  No height on file for this encounter.  HC Readings from Last 1 Encounters:   No data found for [facility]     Body mass index is 17.88 kg/m??., <1 %ile (Z= -2.35) based on CDC (Boys, 2-20 Years) BMI-for-age data using weight from 01/08/2020 and height from 12/11/2019.    Physical Exam:  General: 19 y.o. male who is curled up, shivering, appears uncomfortable, eyes closed, intermittently sleeping but arousable to voice  HEENT: Normocephalic, atraumatic, PERRL, prominent scleral icterus, nares patent without discharge, MMM  RESP: CTAB, no wheezes/rhonchi/crackles, normal WOB, no retractions, good breath sounds equally and bilaterally   CV: Tachycardic, regular rhythym, normal S1/S2, no murmurs/rubs/gallops, 2+ distal pulses, cap refill <2 sec  ABD: Soft, non-tender, mild distended  EXTREMITIES: Warm, well perfused, no edema or injuries  SKIN: Warm and dry, no rashes, bruises, noticeable jaundice  NEURO: GCS 13, arousable but quickly falls back asleep, knows who mom is and communicates some with team    Continuous Infusions: None      Hemodynamic/Invasive Device Data (24 hrs):  None    Ventilation/Oxygen Therapy (24hrs):  RA    Tubes and Drains:  None    Data Review: I have reviewed the labs and studies from the last 24 hours.  All lab results last 24 hours:    Recent Results (from the past 24 hour(s))   Carboxyhemoglobin/POC    Collection Time: 01/08/20 12:14 AM   Result Value Ref Range    Carboxyhemoglobin 1.2 (H) <1.2 %   GLUCOSE-BG/POC    Collection Time: 01/08/20 12:14 AM   Result Value Ref Range    Glucose Whole Blood 140 70 - 179 mg/dL   POCT Venous Blood Gas    Collection Time: 01/08/20 12:14 AM   Result Value Ref Range    Specimen Type - Venous Venous     pH, Venous 7.26 (L) 7.32 - 7.42    pCO2, Ven 26 (L) 40 - 60 mmHg    pO2, Ven 45.0 30.0 - 55.0 mmHg    HCO3, Ven 11.4 (L) 22.0 - 27.0 mmol/L    Base Excess, Ven -15.7 (L) -2.0 - 2.0 mmol/L    O2 Saturation, Venous 77.8 40.0 - 85.0 %    FIO2 21 %   HEMOGLOBIN BG/POC    Collection Time: 01/08/20 12:14 AM   Result Value Ref Range    Hemoglobin 6.7 (L) 13.5 - 17.5 g/dL   POCT Ionized Calcium    Collection Time: 01/08/20 12:14 AM   Result Value Ref Range    Ionized Calcium 4.4 4.4 - 5.4 mg/dL   POTASSIUM-BG/POC Collection Time: 01/08/20 12:14 AM   Result Value Ref Range    Potassium, Bld 3.4 3.4 - 4.6 mmol/L   POCT Lactic acid (lactate) Venous    Collection Time: 01/08/20 12:14 AM   Result Value Ref Range    Lactate, Venous 2.3 (H) 0.5 - 1.8 mmol/L   METHEMOGLOBIN/POC    Collection Time: 01/08/20 12:14 AM   Result Value Ref Range    Methemoglobin <1.0 <1.5 %   SODIUM-BG/POC    Collection Time: 01/08/20 12:14 AM   Result Value Ref Range    Sodium Whole Blood 129 (L) 135 - 145 mmol/L   POCT Venous Oxyhemoglobin    Collection Time: 01/08/20 12:14 AM   Result Value Ref Range    Oxyhemoglobin Venous 76.3 40.0 - 85.0 %   Comprehensive Metabolic Panel    Collection Time: 01/08/20 12:16 AM   Result Value Ref Range  Sodium 130 (L) 135 - 145 mmol/L    Potassium 3.8 3.5 - 5.0 mmol/L    Chloride 110 (H) 98 - 107 mmol/L    Anion Gap      CO2      BUN 37 (H) 7 - 21 mg/dL    Creatinine 1.61 (H) 0.70 - 1.30 mg/dL    BUN/Creatinine Ratio 22     EGFR CKD-EPI Non-African American, Male 56 (L) >=60 mL/min/1.75m2    EGFR CKD-EPI African American, Male 48 >=60 mL/min/1.59m2    Glucose 142 70 - 179 mg/dL    Calcium 7.8 (L) 8.5 - 10.2 mg/dL    Albumin 1.9 (L) 3.5 - 5.0 g/dL    Total Protein      Total Bilirubin 36.1 (H) 0.0 - 1.2 mg/dL    AST 096 (H) 19 - 55 U/L    ALT      Alkaline Phosphatase 1,031 (H) 65 - 260 U/L   Magnesium Level    Collection Time: 01/08/20 12:16 AM   Result Value Ref Range    Magnesium 2.0 1.6 - 2.2 mg/dL   Phosphorus Level    Collection Time: 01/08/20 12:16 AM   Result Value Ref Range    Phosphorus 3.5 2.9 - 4.7 mg/dL   CBC w/ Differential    Collection Time: 01/08/20 12:16 AM   Result Value Ref Range    WBC      RBC 2.79 (L) 4.50 - 5.90 10*12/L    HGB 7.8 (L) 13.5 - 17.5 g/dL    HCT 04.5 (L) 40.9 - 53.0 %    MCV 86.5 80.0 - 100.0 fL    MCH 28.1 26.0 - 34.0 pg    MCHC 32.5 31.0 - 37.0 g/dL    RDW 81.1 (H) 91.4 - 15.0 %    MPV      Platelet      Variable HGB Concentration Moderate (A) Not Present    Neutrophil Left Shift 3+ (A) Not Present    Microcytosis Marked (A) Not Present    Macrocytosis Slight (A) Not Present    Anisocytosis Marked (A) Not Present    Hyperchromasia Slight (A) Not Present   aPTT    Collection Time: 01/08/20 12:17 AM   Result Value Ref Range    APTT 43.7 (H) 25.3 - 37.1 sec    Heparin Correlation 0.2    PT-INR    Collection Time: 01/08/20 12:17 AM   Result Value Ref Range    PT 26.7 (H) 10.5 - 13.5 sec    INR 2.34    Fibrinogen    Collection Time: 01/08/20 12:17 AM   Result Value Ref Range    Fibrinogen 198 177 - 386 mg/dL   D-Dimer, Quantitative    Collection Time: 01/08/20 12:17 AM   Result Value Ref Range    D-Dimer 396 (H) <230 ng/mL DDU       Imaging: Pending     [place] Rihaan Barrack, MD  PGY-2, Pediatrics  Pager [phone]

## 2020-01-08 NOTE — Unmapped (Signed)
PICU Progress Note    Interval events: Admitted, placed on 15 of NE overnight without titration given no art line and unclear GOC. This morning, mom stated that he had an art line in previous admission and consented to another one. Now placed and titrating NE. Transplant hepatology consulted and assisting with management.     LOS: 0 days    Assessment: [name] Gross is a 19 y.o. 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 Reassuringly is SORA (though wearing oxygen for comfort). S/p total ~4L IVF resuscitation at OSH. Will continue antibiotics x48 hours while awaiting cultures. Requires care in the PICU for close hemodynamic monitoring.   ??  RESP:   - SORA  - albuterol PRN  - CXR  - ABG PRN+ q12  ??  CV: tachycardic, hypotensive on arrival  - CRM  - Norepi at ~111 mcg/min for MAP >65,   ??  Renal: Creatine 1.72 here, pharm calculated EGFR 34 based on OSH labs  - Strict I/Os  - Chem 10 q8hr  ??  FEN/GI: follows with Dr. Melrose Nakayama. Elevated LFTs shock liver vs. rejection  - D5 at mIVF (1/2 NS and 1/2 sodium acetate)  - NPO, placing NGT  - lactulose 10g TID, titrate to 3-5 BMs/day  - Ped GI, transplant hepatology following  - continue tacro 0.5mg  BID  - hold MMF 540mg  BID given septic, will resume when able  - Home meds on hold: rifaximin, spironolactone, lasix 10mg  daily, Mag ox, MVI,  - IV protonix 40mg  daily  - HFP, PT/INR q12  - Nutrition consult given severe protein-calorie malnutrition  - diagnostic paracentesis   - 25% albumin TID x3 days  ??  HEME:   - DIC panel daily  - IV Vitamin K 10mg  x3 days (2/28- )  - SCDs for DVT ppx  - heme/onc c/s given complex picture, coagulopathy/DVT ppx recs  ??  ID:   - Continue cefepime/flagyl/vanc  - F/u BCx from OSH  - CBC q12  - home ppx held in setting of NPO/AMS: valganciclovir and bactrim MWF    ENDO:   - start stress dose with hydrocortisone 50mg  q6  - resume prednisone once off stress dose steroids  ??  NEURO:   - q2hr Neuro checks  - IV dilaudid PRN  - Remeron re-start once able to PO   - Avoid tylenol/nsaids given acute renal and chronic liver failure  - PT/OT consult  - consider psychology/psychiatry consult if prolonged admission  - consider head CT if no improvement with above interventions  ??  Social:  - SCT consult tomorrow, has followed closely in the past  - clarify GOC given age  ??  Access: PIV x2    DVT ppx: SCDs     Changes to Lines/Tubes: A line placed; PIV x3   Family communication: updated at bedside   Dispo: PICU for monitoring    PICU Resident Pager: [phone]  PICU Resident Phone: [phone]    Vitals:  Dosing weight:    Most recent weight: 51.8 kg (114 lb 3.2 oz) (01/08/20 0005)    Temp:  [37 ??C-37.9 ??C] 37 ??C  Heart Rate:  [110-120] 113  SpO2 Pulse:  [110-120] 113  Resp:  [14-21] 18  BP: (94-121)/(58-85) 106/77  MAP (mmHg):  [  80-96] 88  FiO2 (%):  [100 %] 100 %  SpO2:  [100 %] 100 %     I/O       02/26 0701 - 02/27 0700 02/27 0701 - 02/28 0700 02/28 0701 - 03/01 0700    I.V. (mL/kg)  765.2 (14.8) 405.1 (7.8)    IV Piggyback  75     Total Intake  840.2 405.1    Urine (mL/kg/hr)  85 50 (0.2)    Total Output(mL/kg)  85 (1.6) 50 (1)    Net  +755.2 +355.1                  Physical Exam:  General: ill appearing, lying in bed, will intermittently open eyes but does not answer questions or follows commands, intermittent moaning   HEENT: scleral icterus   CV: tachycardic, no murmurs; 2+ pulses   Resp: lungs clear, no increased WOB  Abd: soft non tender, slightly distended  Ext: moving all extremities; skin diffusely jaundiced  Neuro: oriented x0    Labs and studies reviewed. Pertinent results include  WBC 15 (ANC 12.6)  Hgb 7.8  Plts 81 (occassional schistocytes on smear)    Na 130, K 3.8, Mg 2. Phos 3.5  Cl 110  Bicarb too icteric to read  BUN 37/Cr 1.7 (baseline 0.7/0.8 until Nov, then ~1.2)  Albumin 1.9 (was 3.5 on 2/23)  Tbili 36  AST 566  ALT specimen icteric  Alk phos 1031  PT 27, INR 2.3  PTT 44  DDimer 396  Fibrinogen 198  Ammonia 15    VBG 7.27/27    Lines/Tubes:   Patient Lines/Drains/Airways Status    Active Active Lines, Drains, & Airways     Name:   Placement date:   Placement time:   Site:   Days:    Peripheral IV 01/08/20 Posterior;Right Hand   01/08/20    0000    Hand   less than 1    Peripheral IV 01/08/20 Left;Posterior Hand   01/08/20    0000    Hand   less than 1    Peripheral IV (Ped) 01/08/20 Left Antecubital   01/08/20    ???     less than 1    Arterial Line 01/08/20 Right Radial   01/08/20    0935    Radial   less than 1

## 2020-01-08 NOTE — Unmapped (Signed)
Pediatric Palliative Care Consult Note:  ??  [name] Gross is a 19 y.o. male seen for pediatric palliative care consultation at the request of [name] Gross for goals of care, advance care planning, and family support.  ??  Primary Care Provider: [name], MD  ??  History provided by: Mother   ??  Assessment:  ??  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 He is readmitted with several day history of acute decline at home and refusal to seek emergency care; eventually agreed to go to the hospital and is now admitted with clinical picture concerning for septic shock. He is encephalopathic and unable to communicate. Parents are hoping for him to recover but recognize that he may not.     ??  Summary of Discussion and Recommendations:   ??  1. SYMPTOMS:   - Unable to assess with direct questioning due to encephalopthy  - Mother has no concerns about discomfort at this time but dislikes his breathing pattern/noisy breathing as it is reminiscent of what she's seen at end of life with nursing home residents  - Mother feels comfortable advocating for symptom management if she sees signs of discomfort  ??  2. GOALS:   - [name] Gross previously expressed goals of redo transplant but has been less certain about this as his condition declines. Most recently he was focused on getting home and trying to enjoy things he likes (food, time with people he cares about) but   - [name] Gross has long term goals which include going back to school and becoming a Runner, broadcasting/film/video or nurse  - Mother and father agree to continue fighting with goal of [name] Gross returning to previous baseline and having real conversations about what he wants, though she acknowledges that he wasn't able to have these conversations previously and isn't sure that will change   - Mother shares that [name] Gross had very poor QOL prior to this admission and it has been declining steadily. She is not sure what is acceptable to him but it has been hard to watch him change.   ??  3. DECISIONS:   - Information/communication preferences: [name] Gross has historically  liked to receive information directly. There have been concerns about him being able to retain some information due to encephalopathy, mood disorder, etc in the past. He is currently not able to communicate.   - NG tube for supplemental nutrition was recommended during last admission and [name] Gross declined due to pain and difficulty breathing with tube in place. He has a tube now for meds - family opted to place this.  - Transplant: [name] Gross is aware that redo transplant is not currently an option. He and his family have addressed some of the psychosocial barriers to this already, but he has understandably expressed ambivalence as he has significant fatigue and feels overwhelmed at baseline    ??  4. ADVANCE CARE PLANNING  - [name] Gross previously designated surrogate decision makers when he was not living with/communicating with his parents and was asked to consider whether this is still his wish during last admission. Do not see documentation of changes in this plan - parents are present and engaged in supporting [name] Gross and making decisions for him as next of kin. They are communicating with his grandfather who was designated a Management consultant  previously.   - Code status: Full code. [name] Gross has been considering his wishes around life-sustaining treatments using an ACP guide and has not felt comfortable making decisions. Counseled mother that medical team may make recommendations about life-sustaining treatments if he has an acute change in his condition - this would be influenced by his overall condition at the time and would reflect our best estimate of the likelihood of such interventions (intubation, CPR, etc) extending [name] Gross's life. Some families appreciate recommendations and I wonder if this would be helpful to them given that they want to respect [name] Gross's wishes but he hasn't been able to express them.   - Prognosis: Very worrisome for [name] Gross nearing end of life given severity of liver disease, severity of acute illness, and potential for lack of response to therapies given how extremely ill he is at baseline. Mother is aware he may not survive this admission but is feeling conflicted about what to do given that he has not been able to express his wishes clearly.  ??  5. SUPPORT:   - Parents and living grandparents ([name], MGM, MGF) are supports for [name] Gross and have been providing care  - Hospice services have been in place through [name] and [name] Gross and family have good relationships with this team  ??  6. CARE COORDINATION:   - Care coordinated with PICU team, hospice         Total time spent with patient for evaluation & management (excluding ACP documented separately): 55 minutes.  Greater than 50% of this time spent on counseling/coordination of care: Yes.  See ACP Note from today for additional billable service: No     We appreciate the consult. The Children's Supportive Care Team can be reached by pager [name] Gross) or email ([email or web address]).   ??  ??  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 He has continued to struggle with decisions around his health care, maintaining some focus (or allowing family to do so - he seems ambivalent/exhausted/overwhelmed) on redo liver transplant but not clearly committing to the things needed to get him there. He and his family accepted a hospice referral given his prognosis and need for additional support, and he has been working with a team from [facility] for several months. They reached out to [place] early last week when Oma had been seen in clinic and declined admission for hydration/evaluation of electrolyte derangement. His family wanted him to be admitted and he was not willing. He was counseled by hospice team that not making a decision about what he wanted and staying home was effectively making a decision, and that he may not survive this illness. His symptoms progressed as the week went on and comfort medications were made available; hospice team continued to reach out and make visits.  Family continued to urge him to seek emergency care and he ultimately agreed - mother shared that it wasn't so much agreeing as stopping disagreeing, but because he has not clearly stated that he is ready to stop pursuing life extending treatments or to let the possibility of transplant go, they opted to bring him to the ER.    He is now being treated for presumed septic shock - acidosis, hypotension, etc - and workup is ongoing.  He has been unresponsive/encephalopathic. NG tube placed for lactulose and other meds - he was clear during last admission that he did not want this for supplemental nutrition but did not specify that he would not want an NG tube for medications in the setting of a life-threatening emergency.    Mother shares that she and his father have continued to focus on fighting with the goal of Radwan recovering to a point where he can make decisions. She notes he was reluctant to make decisions before and she doesn't feel confident that that would change but hopes it will. She describes his quality of life as exceedingly poor prior to this admission (and following last d/c - he did not make any progress) and notes this is distressing to him and the whole family. They all wish he could be like he was in 2019 when he was well post-transplant, living his life, regaining some of the time that was stolen from his adolescence by his terrible and unexpected liver disease. She thinks the only thing that would possibly get him back to that point is liver transplant, and notes he is not currently a candidate. She isn't sure he could ever become one again but hopes he could. She hears his breathing pattern and rattling noises that make her think of the people she's cared for in nursing homes as they're dying and wonders if he will ever wake up.    Mother shared tremendous grief over [name] Gross's situation. She has spent countless hours reliving all the decisions she's made about him/for him his entire life and expresses regret over not being able to prevent him from having liver disease. She doesn't know if/how she could ever get through this/recover from losing him.     See above for more details of visit.     .  ??      Past Medical History:   Diagnosis Date   ??? 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 Depression    ??? MSSA bacteremia 08/27/2019   ??? Seasonal allergies    ??? Sickle cell trait (CMS-HCC)    ??? Strep Mitis Central line-associated bloodstream infection 08/27/2019     Past Surgical History:   Procedure Laterality Date   ??? FRENULECTOMY, LINGUAL     ??? PR ERCP REMOVE FOREIGN BODY/STENT BILIARY/PANC DUCT N/A 08/20/2017    Procedure: ENDOSCOPIC RETROGRADE CHOLANGIOPANCREATOGRAPHY (ERCP); W/ REMOVAL OF FOREIGN BODY/STENT FROM BILIARY/PANCREATIC DUCT(S);  Surgeon: Vonda Antigua, MD;  Location: GI PROCEDURES [facility];  Service: Gastroenterology   ??? PR ERCP REMOVE FOREIGN BODY/STENT BILIARY/PANC DUCT N/A 06/04/2018    Procedure: ENDOSCOPIC RETROGRADE CHOLANGIOPANCREATOGRAPHY (ERCP); W/ REMOVAL OF FOREIGN BODY/STENT FROM BILIARY/PANCREATIC DUCT(S);  Surgeon: Chriss Driver, MD;  Location: GI PROCEDURES [facility];  Service: Gastroenterology   ??? PR ERCP STENT PLACEMENT BILIARY/PANCREATIC DUCT N/A 11/21/2016    Procedure: ENDOSCOPIC RETROGRADE CHOLANGIOPANCREATOGRAPHY (ERCP); WITH PLACEMENT OF ENDOSCOPIC STENT INTO BILIARY OR PANCREATIC DUCT;  Surgeon: Vonda Antigua, MD;  Location: GI PROCEDURES [facility];  Service: Gastroenterology   ??? PR ERCP,W/REMOVAL STONE,BIL/PANCR DUCTS N/A 08/20/2017    Procedure: ERCP; W/ENDOSCOPIC RETROGRADE REMOVAL OF CALCULUS/CALCULI FROM BILIARY &/OR PANCREATIC DUCTS;  Surgeon: Vonda Antigua, MD;  Location: GI PROCEDURES [facility];  Service: Gastroenterology   ??? PR ERCP,W/REMOVAL STONE,BIL/PANCR DUCTS N/A 06/04/2018    Procedure: ERCP; W/ENDOSCOPIC  RETROGRADE REMOVAL OF CALCULUS/CALCULI FROM BILIARY &/OR PANCREATIC DUCTS;  Surgeon: Chriss Driver, MD;  Location: GI PROCEDURES [facility];  Service: Gastroenterology   ??? PR TRANSPLANT LIVER,ALLOTRANSPLANT N/A 09/22/2016    Procedure: LIVER ALLOTRANSPLANTATION; ORTHOTOPIC, PARTIAL OR WHOLE, FROM CADAVER OR LIVING DONOR, ANY AGE;  Surgeon: Doyce Loose, MD;  Location: MAIN OR [facility];  Service: Transplant   ??? PR TRANSPLANT,PREP DONOR LIVER, WHOLE N/A 09/22/2016    Procedure: [facility] STD PREP CAD DONOR WHOLE LIVER GFT PRIOR TNSPLNT,INC CHOLE,DISS/REM SURR TISSU WO TRISEG/LOBE SPLT;  Surgeon: Doyce Loose, MD;  Location: MAIN OR [facility];  Service: Transplant   ??? PR UPGI ENDOSCOPY W/US FN BX N/A 08/20/2017    Procedure: UGI W/ TRANSENDOSCOPIC ULTRASOUND GUIDED INTRAMURAL/TRANSMURAL FINE NEEDLE ASPIRATION/BIOPSY(S), ESOPHAGUS;  Surgeon: Vonda Antigua, MD;  Location: GI PROC[facility]URES [facility];  Service: Gastroenterology   ??? PR UPPER GI ENDOSCOPY,BIOPSY N/A 07/15/2016    Procedure: UGI ENDOSCOPY; WITH BIOPSY, SINGLE OR MULTIPLE;  Surgeon: Arnold Long Mir, MD;  Location: PEDS PROCEDURE ROOM [facility];  Service: Gastroenterology   ??? PR UPPER GI ENDOSCOPY,CTRL BLEED Left 05/05/2016    Procedure: UGI ENDOSCOPY; WITH CONTROL OF BLEEDING, ANY METHOD;  Surgeon: Arnold Long Mir, MD;  Location: PEDS PROCEDURE ROOM [facility];  Service: Gastroenterology   ??? PR UPPER GI ENDOSCOPY,CTRL BLEED N/A 05/12/2016    Procedure: UGI ENDOSCOPY; WITH CONTROL OF BLEEDING, ANY METHOD;  Surgeon: Annie Paras, MD;  Location: CHILDRENS OR [facility];  Service: Gastroenterology   ??? PR UPPER GI ENDOSCOPY,DIAGNOSIS N/A 09/21/2019    Procedure: UGI ENDO, INCLUDE ESOPHAGUS, STOMACH, & DUODENUM &/OR JEJUNUM; DX W/WO COLLECTION SPECIMN, BY BRUSH OR WASH;  Surgeon: Monte Fantasia, MD;  Location: GI PROC[facility]URES [facility];  Service: Gastroenterology   ??? PR UPPER GI ENDOSCOPY,DIAGNOSIS N/A 12/13/2019    Procedure: UGI ENDO, INCLUDE ESOPHAGUS, STOMACH, & DUODENUM &/OR JEJUNUM; DX W/WO COLLECTION SPECIMN, BY BRUSH OR WASH;  Surgeon: Neysa Hotter, MD;  Location: GI PROCEDURES [facility];  Service: Gastroenterology   ??? PR UPPER GI ENDOSCOPY,TUBE PLACE N/A 12/13/2019    Procedure: UGI ENDO; W/TRANSENDOSCOPIC TUBE/CATH PLCMT;  Surgeon: Neysa Hotter, MD;  Location: GI PROC[facility]URES [facility];  Service: Gastroenterology     Family History   Problem Relation Age of Onset   ??? Diabetes Maternal Grandmother    ??? Diabetes Paternal Grandmother    ??? Hypertension Maternal Grandfather    ??? Hypertension Paternal Grandfather    ??? Asthma Brother    ??? Anesthesia problems Neg Hx    ??? Melanoma Neg Hx    ??? Basal cell carcinoma Neg Hx    ??? Squamous cell carcinoma Neg Hx      Social History: High school graduate. Would like to be a Runner, broadcasting/film/video or a Engineer, civil (consulting). Has had struggles with homelessness. now living between homes of mother/stepfather and [name]. Father has been involved in his care recently and providing hands on care when [name] Gross is with GM.      Allergies:  Bee pollen and Pollen extracts  ??  Medications:  Scheduled Meds:  ??? albumin human  25 g Intravenous TID   ??? Cefepime 2 g Intravenous Q12H   ??? hydrocortisone sod succ  50 mg Intravenous Q6H   ??? metronidazole  250 mg Intravenous Q8H   ??? pantoprazole (PROTONIX) intravenous solutio  40 mg Intravenous Daily   ??? phytonadione (AQUA-MEPHYTON) intravenous  10 mg Intravenous Daily   ??? Tacrolimus  0.5 mg Enteral tube: gastric  BID  Continuous Infusions:  ??? heparin (porcine) 3 mL/hr (01/08/20 1300)   ??? norepinephrine bitartrate-NS 13 mcg/min (01/08/20 1423)   ??? PEDIATRIC Custom IV infusion builder 100 mL/hr (01/08/20 1300)     PRN Meds:.albuterol, HYDROmorphone, lactulose    Physical Exam:   Vitals:    01/08/20 1300   BP: 103/68   Pulse: 115   Resp: 13   Temp:    SpO2: 100%   ??  Gen: chronically ill young man lying on side, unresponsive to voice/touch, no obvious distress.   Skin: jaundiced, pale  HEENT: eyelids edematous, OP clear, MMM, patches of alopecia (recent haircut makes this apparent), audible stertor/secretions  Chest: mild retractions, slow respirations  Abd: protruberant but thin relative to last exam earlier this month  Ext: no LE/UE edema  Neuro: unresponsive     Data: Reviewed test results in Epic.    Lab Results   Component Value Date    WBC  01/08/2020      Comment:      Pending.    HGB 7.3 (L) 01/08/2020    HCT 22.9 (L) 01/08/2020    PLT  01/08/2020      Comment:      Pending.       Lab Results   Component Value Date    NA 135 01/08/2020    K 3.4 01/08/2020    CL 110 (H) 01/08/2020    CO2  01/08/2020      Comment:      Specimen icteric.      BUN 38 (H) 01/08/2020    CREATININE 1.83 (H) 01/08/2020    GLU 247 (H) 01/08/2020    CALCIUM 8.0 (L) 01/08/2020    MG 2.0 01/08/2020    PHOS 4.2 01/08/2020       Lab Results   Component Value Date    BILITOT 38.4 (H) 01/08/2020    BILIDIR 34.90 (H) 01/08/2020    PROT  01/08/2020      Comment:      Specimen icteric.      ALBUMIN 2.3 (L) 01/08/2020    ALT  01/08/2020      Comment:      Specimen icteric.      AST 693 (H) 01/08/2020    ALKPHOS 1,362 (H) 01/08/2020    GGT 359 (H) 01/03/2020       Lab Results   Component Value Date    PT 26.7 (H) 01/08/2020    INR 2.34 01/08/2020    APTT 43.7 (H) 01/08/2020       CXR clear       Waldon Reining, MD, MPH  Attending Physician, Children's Supportive Care Team

## 2020-01-08 NOTE — Unmapped (Signed)
Pediatric Tacrolimus Therapeutic Monitoring Pharmacy Note    [name] Gross is a 19 y.o. male continuing tacrolimus.     Indication: Liver transplant     Date of Transplant: 2017      Prior Dosing Information: Home regimen tacrolimus 0.5 mg PO every 12 hours     Dosing Weight: 51.8 kg    Goals:  Therapeutic Drug Levels  Tacrolimus trough goal: 8-10 ng/mL    Additional Clinical Monitoring/Outcomes  ?? Monitor renal function (SCr and urine output) and liver function (LFTs)  ?? Monitor for signs/symptoms of adverse events (e.g., hyperglycemia, hyperkalemia, hypomagnesemia, hypertension, headache, tremor)    Results:   Tacrolimus level: Not applicable    Pharmacokinetic Considerations and Significant Drug Interactions:  ? Concurrent hepatotoxic medications: None identified  ? Concurrent CYP3A4 substrates/inhibitors: None identified  ? Concurrent nephrotoxic medications: Vancomycin being dosed by level due to renal dysfunction    Assessment/Plan:  Recommendedation(s)  ?? Continue home regimen of tacrolimus 0.5 mg every 12 hours. Would recommend utilizing the tacrolimus suspension for easier administration via g-tube.   ?? Obtain tacrolimus level prior to AM dose tomorrow to ensure level is not supra-therapeutic given degree of liver dysfunction      Follow-up  ? Next level has been ordered on 01/09/20 at 0800.   ? A pharmacist will continue to monitor and recommend levels as appropriate    Please page service pharmacist with questions/clarifications.    Aura Fey, PharmD  Pediatric Clinical Pharmacist  [phone] (pager)  [phone] (pediatric pharmacy)

## 2020-01-08 NOTE — Unmapped (Signed)
SW contacted MD re: SW c/s. Unclear at this time of SW needs. Palliative Care consulted, pending GOC discussion and recommendations. CM to follow patient and family for continued support, avoidable delays, discharge planning and opportunities for progression of care. CM to re-consult SW in the event SW needs/concerns arise.     Vaughan Browner 01/08/2020 2:42 PM

## 2020-01-08 NOTE — Unmapped (Signed)
PEDIATRIC GASTROENTEROLOGY INPATIENT CONSULTATION NOTE    Requesting Attending Physician :  [name] Field, MD    Reason for Consult:    [name] Gross is a 19 y.o. male seen in consultation at the request of Dr. Evalina Field, MD regarding his transplant medications.    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  He also has complications of end-stage liver disease including ascites and grade 1 esophageal varices.  He is currently admitted in view of likely septic shock.  ??  Pediatric GI consult is obtained regarding his immunosuppressive transplant medications while being n.p.o in the setting of possible septic shock.     He came in with altered mental status and was started on IV antibiotics after cultures were obtained.  Ascitic fluid tap not done at time of consult.  His labs reveal prolonged PT/INR, evidence of acute renal injury that appears to be responding to fluid resuscitation and vasopressor support.  His CBC is notable for a downtrending platelet count.    RECS after discussing with primary GI:   1.  Pediatric GI consult was obtained regarding his transplant medications: If he has sepsis, would hold tacrolimus and mycophenolate mofetil.  2.  Recommend obtaining transplant hepatology consult    History of Present Illness:   This is a 19 y.o. 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  He also has complications of end-stage liver disease including ascites and grade 1 esophageal varices.  He is currently admitted in view of likely septic shock.    He required a total of 4 L of IV fluid resuscitation at OSH.    From [name] Gross's note:   Mom states that he has been feeling worse for the past few days with increasing fatigue and weakness but refused to go to the hospital. He had follow-up with Dr. Melrose Gross 5 days ago where it was recommended he go to the ED due to worsening labs (hyponatremia, decreased bicarb, increased WBC), low BPs, poor PO, and generalized weakness. Mom finally convinced him today that he needed to go to the hospital because he was unresponsive and disoriented. Mom gave him some morphine earlier today due to pain which didn't seem to make a difference for him. He's also had a cough for a couple weeks but didn't complain of chest pain until today. He has also been unable to take his medications intermittently over the last few days due to poor PO. He has not had vomiting or nausea but just refuses to eat. He has been drinking and peeing though. No fevers, rhinorrhea, abdominal pain, diarrhea, rashes, or shortness of breath. He has been in hospice since November and has seen Supportive Care Team here during most admissions.  ??  At [facility] they obtained 2 peripheral blood cultures, gave 2L NS, 2L LR, normal UA, UCx and labs which showed elevated WBC, hyponatremia, and a Creatinine bump from previous admission. They transferred to [facility] given poor mental status and concern for septic shock in the setting of soft BP, tachycardia and AMS. They started vanc, cefepime, and flagyl prior to transfer. He also received NaHCO3 as well.    Review of Systems:  The balance of 12 systems reviewed is negative except as noted in the HPI.     Past Medical History:  Past Medical History:   Diagnosis Date   ???  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 Depression    ??? MSSA bacteremia 08/27/2019   ??? Seasonal allergies    ??? Sickle cell trait (CMS-HCC)    ??? Strep Mitis Central line-associated bloodstream infection 08/27/2019       Surgical History:  Past Surgical History:   Procedure Laterality Date   ??? FRENULECTOMY, LINGUAL     ??? PR ERCP REMOVE FOREIGN BODY/STENT BILIARY/PANC DUCT N/A 08/20/2017    Procedure: ENDOSCOPIC RETROGRADE CHOLANGIOPANCREATOGRAPHY (ERCP); W/ REMOVAL OF FOREIGN BODY/STENT FROM BILIARY/PANCREATIC DUCT(S);  Surgeon: Vonda Antigua, MD;  Location: [facility];  Service: Gastroenterology   ??? PR ERCP REMOVE FOREIGN BODY/STENT BILIARY/PANC DUCT N/A 06/04/2018    Procedure: ENDOSCOPIC RETROGRADE CHOLANGIOPANCREATOGRAPHY (ERCP); W/ REMOVAL OF FOREIGN BODY/STENT FROM BILIARY/PANCREATIC DUCT(S);  Surgeon: Chriss Driver, MD;  Location: [facility];  Service: Gastroenterology   ??? PR ERCP STENT PLACEMENT BILIARY/PANCREATIC DUCT N/A 11/21/2016    Procedure: ENDOSCOPIC RETROGRADE CHOLANGIOPANCREATOGRAPHY (ERCP); WITH PLACEMENT OF ENDOSCOPIC STENT INTO BILIARY OR PANCREATIC DUCT;  Surgeon: Vonda Antigua, MD;  Location: GI [facility]URES [facility];  Service: Gastroenterology   ??? PR ERCP,W/REMOVAL STONE,BIL/PANCR DUCTS N/A 08/20/2017    Procedure: ERCP; W/ENDOSCOPIC RETROGRADE REMOVAL OF CALCULUS/CALCULI FROM BILIARY &/OR PANCREATIC DUCTS;  Surgeon: Vonda Antigua, MD;  Location: [facility];  Service: Gastroenterology   ??? PR ERCP,W/REMOVAL STONE,BIL/PANCR DUCTS N/A 06/04/2018    Procedure: ERCP; W/ENDOSCOPIC RETROGRADE REMOVAL OF CALCULUS/CALCULI FROM BILIARY &/OR PANCREATIC DUCTS;  Surgeon: Chriss Driver, MD;  Location: [facility];  Service: Gastroenterology   ??? PR TRANSPLANT LIVER,ALLOTRANSPLANT N/A 09/22/2016    Procedure: LIVER ALLOTRANSPLANTATION; ORTHOTOPIC, PARTIAL OR WHOLE, FROM CADAVER OR LIVING DONOR, ANY AGE;  Surgeon: Doyce Loose, MD;  Location: MAIN OR [facility];  Service: Transplant   ??? PR TRANSPLANT,PREP DONOR LIVER, WHOLE N/A 09/22/2016    Procedure: [facility] STD PREP CAD DONOR WHOLE LIVER GFT PRIOR TNSPLNT,INC CHOLE,DISS/REM SURR TISSU WO TRISEG/LOBE SPLT;  Surgeon: Doyce Loose, MD;  Location: MAIN OR [facility];  Service: Transplant   ??? PR UPGI ENDOSCOPY W/US FN BX N/A 08/20/2017    Procedure: UGI W/ TRANSENDOSCOPIC ULTRASOUND GUIDED INTRAMURAL/TRANSMURAL FINE NEEDLE ASPIRATION/BIOPSY(S), ESOPHAGUS;  Surgeon: Vonda Antigua, MD;  Location: [facility];  Service: Gastroenterology   ??? PR UPPER GI ENDOSCOPY,BIOPSY N/A 07/15/2016    Procedure: UGI ENDOSCOPY; WITH BIOPSY, SINGLE OR MULTIPLE;  Surgeon: Arnold Long Mir, MD;  Location: PEDS PROCEDURE ROOM [facility];  Service: Gastroenterology   ??? PR UPPER GI ENDOSCOPY,CTRL BLEED Left 05/05/2016    Procedure: UGI ENDOSCOPY; WITH CONTROL OF BLEEDING, ANY METHOD;  Surgeon: Arnold Long Mir, MD;  Location: PEDS PROCEDURE ROOM [facility];  Service: Gastroenterology   ??? PR UPPER GI ENDOSCOPY,CTRL BLEED N/A 05/12/2016    Procedure: UGI ENDOSCOPY; WITH CONTROL OF BLEEDING, ANY METHOD;  Surgeon: Annie Paras, MD;  Location: CHILDRENS OR [facility];  Service: Gastroenterology   ??? PR UPPER GI ENDOSCOPY,DIAGNOSIS N/A 09/21/2019    Procedure: UGI ENDO, INCLUDE ESOPHAGUS, STOMACH, & DUODENUM &/OR JEJUNUM; DX W/WO COLLECTION SPECIMN, BY BRUSH OR WASH;  Surgeon: Monte Fantasia, MD;  Location: [facility];  Service: Gastroenterology   ??? PR UPPER GI ENDOSCOPY,DIAGNOSIS N/A 12/13/2019    Procedure: UGI ENDO, INCLUDE ESOPHAGUS, STOMACH, & DUODENUM &/OR JEJUNUM; DX W/WO COLLECTION  SPECIMN, BY BRUSH OR WASH;  Surgeon: Neysa Hotter, MD;  Location: [facility];  Service: Gastroenterology   ??? PR UPPER GI ENDOSCOPY,TUBE PLACE N/A 12/13/2019    Procedure: UGI ENDO; W/TRANSENDOSCOPIC TUBE/CATH PLCMT;  Surgeon: Neysa Hotter, MD;  Location: [facility];  Service: Gastroenterology       Family History:  The patient's family history includes Asthma in his brother; Diabetes in his maternal grandmother and paternal grandmother; Hypertension in his maternal grandfather and paternal grandfather..    Medications:   Current Facility-Administered Medications Medication Dose Route Frequency Provider Last Rate Last Admin   ??? albumin human 25 % bottle 25 g  25 g Intravenous TID Alfonse Ras, MD   25 g at 01/08/20 1413   ??? albuterol 2.5 mg /3 mL (0.083 %) nebulizer solution 2.5 mg  2.5 mg Nebulization Q6H PRN Melinda Crutch, MD       ??? carboxymethylcellulose sodium (REFRESH CELLUVISC) 1 % ophthalmic gel 1 drop  1 drop Both Eyes 4x Daily PRN Lanier Prude, MD       ??? cefepime (MAXIPIME) 2 g in sodium chloride 0.9 % (NS) 100 mL IVPB-connector bag  2 g Intravenous Q12H Lanier Prude, MD       ??? heparin 1,000 units/500 mL (2 units/mL) in 0.9% NaCl infusion  3 mL/hr Intravenous Continuous Melinda Crutch, MD 3 mL/hr at 01/08/20 1500 3 mL/hr at 01/08/20 1500   ??? hydrocortisone sod succ (Solu-CORTEF) injection 50 mg  50 mg Intravenous Q6H Melinda Crutch, MD   50 mg at 01/08/20 1420   ??? HYDROmorphone (PF) (DILAUDID) injection 0.5 mg  0.5 mg Intravenous Q3H PRN Alfonse Ras, MD   0.5 mg at 01/08/20 1626   ??? lactulose (CHRONULAC) oral solution  10 g Enteral tube: gastric  BID Lanier Prude, MD       ??? LORazepam (ATIVAN) injection 1 mg  1 mg Intravenous Once Lanier Prude, MD       ??? LORazepam (ATIVAN) injection 1 mg  1 mg Intravenous Once PRN Lanier Prude, MD       ??? metroNIDAZOLE (FLAGYL) 5 mg/mL in sodium chloride 0.9% injection 250 mg  250 mg Intravenous Q8H [name] E Reasor, MD   Stopped at 01/08/20 1249   ??? norepinephrine 8 mg in sodium chloride 0.9 % 250 mL (0.032 mg/mL) infusion  0-30 mcg/min Intravenous Continuous [name] E Reasor, MD 24.4 mL/hr at 01/08/20 1500 13.013 mcg/min at 01/08/20 1500   ??? pantoprazole dilution (PROTONIX) 0.8 mg/mL injection 40 mg  40 mg Intravenous Daily Melinda Crutch, MD   Stopped at 01/08/20 [phone]   ??? phytonadione (vitamin K1) (AQUA-MEPHYTON) 10 mg in sodium chloride (NS) 0.9 % 50 mL IVPB  10 mg Intravenous Daily Alfonse Ras, MD   Stopped at 01/08/20 1242   ??? sodium chloride 77 mEq/L, sodium acetate 77 mEq/L in dextrose 5 %, sterile water 1,000 mL infusion  100 mL/hr Intravenous Continuous [name] E Reasor, MD 100 mL/hr at 01/08/20 1500 Rate Verify at 01/08/20 1500   ??? tacrolimus (PROGRAF) oral suspension  0.5 mg Enteral tube: gastric  BID Early Osmond, MD           Allergies:  Bee pollen and Pollen extracts    Social History:  Social History     Social History Narrative    Lives part time with mother and part time with [employer].         Objective:  Vital Signs:  Temp:  [37 ??C-37.9 ??C] 37 ??C  Heart Rate:  [110-120] 113  SpO2 Pulse:  [110-120] 113  Resp:  [14-21] 18  BP: (94-121)/(58-85) 106/77  MAP (mmHg):  [80-96] 88  FiO2 (%):  [100 %] 100 %  SpO2:  [100 %] 100 %    Physical Exam:  General: 19 y.o. male, sleeping   HEENT: Normocephalic, atraumatic, PERRL, prominent scleral icterus, nares patent without discharge, MMM  RESP: CTAB, no wheezes/rhonchi/crackles, normal WOB, no retractions, good breath sounds equally and bilaterally   CV: Tachycardic, cap refill <2 sec  ABD: Soft, non-tender, mildly distended  EXTREMITIES: Warm, well perfused, no edema or injuries  SKIN: Warm and dry, no rashes, bruises, noticeable jaundice  NEURO:  resting/ not responding to verbal stim. Could not be assessed    Diagnostic Studies:  I reviewed all pertinent diagnostic studies, including:      Labs:    CBC - Results in Past 2 Days  Result Component Current Result   WBC 15.0 (H) (01/08/2020)   RBC 2.69 (L) (01/08/2020)   HGB 7.3 (L) (01/08/2020)   HCT 22.9 (L) (01/08/2020)   Platelet 81 (L) (01/08/2020)     BMP - Results in Past 2 Days  Result Component Current Result   Glucose 208 (H) (01/08/2020)   Sodium 137 (01/08/2020)    135 (01/08/2020)   Potassium 3.5 (01/08/2020)    3.4 (01/08/2020)   Chloride 111 (H) (01/08/2020)   CO2, POC Not in Time Range   BUN 39 (H) (01/08/2020)   Creatinine 2.05 (H) (01/08/2020)   Calcium 7.8 (L) (01/08/2020)     Coagulation - Results in Past 2 Days  Result Component Current Result   PT 33.9 (H) (01/08/2020)   INR 3.00 (01/08/2020)   APTT 52.8 (H) (01/08/2020)     LFT's - Results in Past 2 Days  Result Component Current Result   ALT (SGPT), POC Not in Time Range   AST 600 (H) (01/08/2020)   Alkaline Phosphatase 1,148 (H) (01/08/2020)     C-Reactive Protein -   No results found for requested labs within last 2 days.       Imaging:   Radiology studies were personally reviewed     Luz Lex MD  PGY 5 Pediatric Gastroenterology Fellow      I supervised the resident or fellow doing the initial consultation who spent at least 40 minutes on the floor or unit in direct patient care. The direct patient care time included face-to-face time with the patient/family, reviewing the patient's chart, communicating with the family and/or other professionals.Greater than 50% of this time was spent in counseling or coordinating care with the patient regarding recent transfer from outside hospital with hypotension and concerns for septic shock. Liver and kidney injury present and requiring pharmacologic support to maintain perfusion. I was available throughout care provided.    Pablo Ledger MD, MPH, MMSc.  Attending Physician; Peds GI/Hepatology/Nutrition  [facility]

## 2020-01-08 NOTE — Unmapped (Signed)
Arterial Line Insertion Procedure Note     Date of Service: 01/08/2020    Patient Name:: [name] Gross  Patient MRN: [id number]    Indications: Frequent lab sampling    Procedure Details:   Informed consent was obtained after explanation of the risks and benefits of the procedure, refer to the consent documentation. Given patient's mental status, mother (designated Environmental health practitioner) gave consent.     Freida Busman???s test was performed to ensure adequate perfusion. The patient???s right wrist was prepped and draped in sterile fashion. 1% Lidocaine was used to anesthetize the area. A 20 G Arrow line was introduced into the radial artery with ultrasound guidance. The catheter was threaded over the guide wire and the needle was removed with appropriate pulsatile blood return. The catheter was then sutured in place to the skin and a sterile CHG dressing applied. Perfusion to the extremity distal to the point of catheter insertion was checked and found to be adequate.  A pressure transducer was connected sterilely to the arterial line and an arterial line waveform was noted on the monitor.     Estimated Blood Loss: 2 ml    Condition:  The patient tolerated the procedure well and remains in the same condition as pre-procedure.    Complications:  The patient tolerated the procedure well and there were no complications.    Plan:  - wean NE to keep MAPs >65    Melinda Crutch, MD

## 2020-01-08 NOTE — Unmapped (Signed)
HEPATOLOGY INPATIENT CONSULTATION NOTE     Requesting Attending Physician:  Evalina Field, MD    Reason for Consult:    [name] Gross is a 19 y.o. 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    ASSESSMENT: patient with liver graft rejection admitted with shock, likely septic with possible hypovolemic shock contributing. He has worsening acute kidney injury likely being driven by sepsis physiology. His liver enzymes (AST/ALT) are elevated above his baseline, likely secondary to ischemic injury to the liver in setting of his shock. There may be a component of hypovolemia contributing as he previously responded robustly to diuretics and has had poor oral intake; on exam he appears dry. We recommend aggressive treatment of presumed septic shock with antibiotics, stress dose steroids, vasopressors and albumin fluid resuscitation. He requires complete infectious work up as below.     If his liver chemistries do not improve with appropriate resuscitation / treatment of shock, this would raise the possibility of worsening liver graft rejection (has a history of significant difficult to control graft rejection).     He is not currently a candidate for transplant consideration due to frailty. Prior hospitalizations have focused on nutritional support (tube feeds) to make him eligible for transplant consideration. He has significant symptoms from his liver disease and works with a palliative care nurse at home for symptom control, but he is not in hospice and has previously desired working towards re-transplant. His current goals are unable to be assessed in setting of his encephalopathy. RECOMMENDATIONS:  1. Continue tacrolimus with tac goal 4-6  2. Stop mycophenolate (held in the setting of sepsis)  3. Give stress dose steroids: 50mg  hydrocortisone four times daily; hold home prednisone  4. Continue vasopressors titrated for MAP > 65  5. Order US Liver doppler  6. Treat with broad spectrum antibiotics: Vancomycin, cefepime, metronidazole  7. Hold diuretics  8. Give 25g of 25% albumin three times daily  9. Continue lactulose titrated to 3-5 bowel movements per day (or if fecal management system is placed)  10. Give 10mg  IV vitamin K daily for 3 days  11. Start tube feeds via NG tube  12. Complete infectious work up: chest X-ray, blood cultures x2, urinalysis, fluid search and if pocket perform diagnostic paracentesis sent for cell count, ascitic fluid albumin, ascitic fluid total protein, ascitic fluid culture; consider lumbar puncture  13. Order zinc level, supplement if deficient  14. Trend daily BMP, hepatic function panel and PT-INR  15. Consult nutrition.    The above recommendations were dicussed with the primary team.    Thank you for this consult. The patient was Discussed with  Dr. Pervis Hocking, MD. We will continue to follow along. Please page hepatology fellow on call with questions.     Jenita Seashore, MD/PhD  [facility] Gastroenterology and Hepatology Fellow    History of Present Illness:  Chief Complaint: management of end-stage liver disease.    [name] Gross is a 19 y.o. 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    He was in his usual state of health until the week prior to admission when he developed worsening fatigue and weakness. Initially he refused to present to hospital but became unresponsive so was brought in by his mother. He has had several weeks of cough. No report of fevers, diarrhea or Shortness of breath.    He was transferred from [facility] Emergency Department to [facility] for management of presumed septic shock given hypotension and Altered mental status.    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    He currently has end-stage liver disease and is working towards consideration for re-transplant. He is too frail for transplant consideration and prior hospitalizations have worked on improving functional status to allow transplant evaluation. He has significant symptoms from his liver disease and has a palliative care nurse who treats his symptoms, but is not enrolled in end-of-life Hospice.    Review of Systems:  The balance of 12 systems reviewed is negative except as noted in the HPI.     Medical / Surgical / Family / Social History:  Past Medical History:   Diagnosis Date   ??? 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 Depression    ??? MSSA bacteremia 08/27/2019   ??? Seasonal allergies    ??? Sickle cell trait (CMS-HCC)    ??? Strep Mitis Central line-associated bloodstream infection 08/27/2019     Past Surgical History:   Procedure Laterality Date   ??? FRENULECTOMY, LINGUAL     ??? PR ERCP REMOVE FOREIGN BODY/STENT BILIARY/PANC DUCT N/A 08/20/2017    Procedure: ENDOSCOPIC RETROGRADE CHOLANGIOPANCREATOGRAPHY (ERCP); W/ REMOVAL OF FOREIGN BODY/STENT FROM BILIARY/PANCREATIC DUCT(S);  Surgeon: Vonda Antigua, MD;  Location: GI PROCEDURES MEMORIAL [facility];  Service: Gastroenterology   ??? PR ERCP REMOVE FOREIGN BODY/STENT BILIARY/PANC DUCT N/A 06/04/2018    Procedure: ENDOSCOPIC RETROGRADE CHOLANGIOPANCREATOGRAPHY (ERCP); W/ REMOVAL OF FOREIGN BODY/STENT FROM BILIARY/PANCREATIC DUCT(S);  Surgeon: Chriss Driver, MD;  Location: GI PROCEDURES MEMORIAL [facility];  Service: Gastroenterology   ??? PR ERCP STENT PLACEMENT BILIARY/PANCREATIC DUCT N/A 11/21/2016    Procedure: ENDOSCOPIC RETROGRADE CHOLANGIOPANCREATOGRAPHY (ERCP); WITH PLACEMENT OF ENDOSCOPIC STENT INTO BILIARY OR PANCREATIC DUCT;  Surgeon: Vonda Antigua, MD;  Location: GI PROCEDURES MEMORIAL [facility];  Service: Gastroenterology   ??? PR ERCP,W/REMOVAL STONE,BIL/PANCR DUCTS N/A 08/20/2017    Procedure: ERCP; W/ENDOSCOPIC RETROGRADE REMOVAL OF CALCULUS/CALCULI FROM BILIARY &/OR PANCREATIC DUCTS;  Surgeon: Vonda Antigua, MD;  Location: GI PROCEDURES MEMORIAL [facility];  Service: Gastroenterology   ??? PR ERCP,W/REMOVAL STONE,BIL/PANCR DUCTS N/A 06/04/2018    Procedure: ERCP; W/ENDOSCOPIC RETROGRADE REMOVAL OF CALCULUS/CALCULI FROM BILIARY &/OR PANCREATIC DUCTS;  Surgeon: Chriss Driver, MD;  Location: GI PROCEDURES MEMORIAL [facility];  Service: Gastroenterology   ??? PR TRANSPLANT LIVER,ALLOTRANSPLANT N/A 09/22/2016    Procedure: LIVER ALLOTRANSPLANTATION; ORTHOTOPIC, PARTIAL OR WHOLE, FROM CADAVER OR LIVING DONOR, ANY AGE;  Surgeon: Doyce Loose, MD;  Location: MAIN OR [facility];  Service: Transplant   ??? PR TRANSPLANT,PREP DONOR LIVER, WHOLE N/A 09/22/2016    Procedure: [facility] STD PREP CAD DONOR WHOLE LIVER GFT PRIOR TNSPLNT,INC CHOLE,DISS/REM SURR TISSU WO TRISEG/LOBE SPLT;  Surgeon: Doyce Loose, MD;  Location: MAIN OR [facility];  Service: Transplant   ???  PR UPGI ENDOSCOPY W/US FN BX N/A 08/20/2017    Procedure: UGI W/ TRANSENDOSCOPIC ULTRASOUND GUIDED INTRAMURAL/TRANSMURAL FINE NEEDLE ASPIRATION/BIOPSY(S), ESOPHAGUS;  Surgeon: Vonda Antigua, MD;  Location: GI PROCEDURES MEMORIAL [facility];  Service: Gastroenterology   ??? PR UPPER GI ENDOSCOPY,BIOPSY N/A 07/15/2016    Procedure: UGI ENDOSCOPY; WITH BIOPSY, SINGLE OR MULTIPLE;  Surgeon: Arnold Long Mir, MD;  Location: PEDS PROCEDURE ROOM [facility];  Service: Gastroenterology   ??? PR UPPER GI ENDOSCOPY,CTRL BLEED Left 05/05/2016    Procedure: UGI ENDOSCOPY; WITH CONTROL OF BLEEDING, ANY METHOD;  Surgeon: Arnold Long Mir, MD;  Location: PEDS PROCEDURE ROOM [facility];  Service: Gastroenterology   ??? PR UPPER GI ENDOSCOPY,CTRL BLEED N/A 05/12/2016    Procedure: UGI ENDOSCOPY; WITH CONTROL OF BLEEDING, ANY METHOD;  Surgeon: Annie Paras, MD;  Location: CHILDRENS OR [facility];  Service: Gastroenterology   ??? PR UPPER GI ENDOSCOPY,DIAGNOSIS N/A 09/21/2019    Procedure: UGI ENDO, INCLUDE ESOPHAGUS, STOMACH, & DUODENUM &/OR JEJUNUM; DX W/WO COLLECTION SPECIMN, BY BRUSH OR WASH;  Surgeon: Monte Fantasia, MD;  Location: GI PROCEDURES MEMORIAL [facility];  Service: Gastroenterology   ??? PR UPPER GI ENDOSCOPY,DIAGNOSIS N/A 12/13/2019    Procedure: UGI ENDO, INCLUDE ESOPHAGUS, STOMACH, & DUODENUM &/OR JEJUNUM; DX W/WO COLLECTION SPECIMN, BY BRUSH OR WASH;  Surgeon: Neysa Hotter, MD;  Location: GI PROCEDURES MEMORIAL [facility];  Service: Gastroenterology   ??? PR UPPER GI ENDOSCOPY,TUBE PLACE N/A 12/13/2019    Procedure: UGI ENDO; W/TRANSENDOSCOPIC TUBE/CATH PLCMT;  Surgeon: Neysa Hotter, MD;  Location: GI PROCEDURES MEMORIAL [facility];  Service: Gastroenterology     Family History   Problem Relation Age of Onset   ??? Diabetes Maternal Grandmother    ??? Diabetes Paternal Grandmother    ??? Hypertension Maternal Grandfather    ??? Hypertension Paternal Grandfather    ??? Asthma Brother    ??? Anesthesia problems Neg Hx    ??? Melanoma Neg Hx    ??? Basal cell carcinoma Neg Hx    ??? Squamous cell carcinoma Neg Hx      Medications Prior to Admission   Medication Sig Dispense Refill Last Dose   ??? calcium carbonate (TUMS) 200 mg calcium (500 mg) chewable tablet Chew 3 tablets (600 mg of elem calcium total) Three (3) times a day. 150 tablet 0 Past Week at Unknown time   ??? clindamycin-benzoyl peroxide 1.2 %(1 % base) -5 % gel Apply to rash on chest (Patient taking differently: Apply 1 application topically nightly. Apply to rash on chest) 45 g 0 Past Week at Unknown time   ??? furosemide (LASIX) 20 MG tablet Take 0.5 tablets (10 mg total) by mouth daily. 15 tablet 0 Past Week at Unknown time   ??? hydrOXYzine (ATARAX) 25 MG tablet Take 1 tablet (25 mg total) by mouth every six (6) hours as needed. 30 capsule 0 Past Week at Unknown time   ??? magnesium oxide (MAGOX) 400 mg (241.3 mg magnesium) tablet Take 1 tablet (400 mg total) by mouth Two (2) times a day. 60 tablet 5 01/07/2020 at Unknown time   ??? mirtazapine (REMERON) 7.5 MG tablet Take 1 tablet (7.5 mg total) by mouth nightly. 60 tablet 0 01/07/2020 at Unknown time   ??? multivitamin, with zinc (AQUADEKS) 100-5 mcg-mg Chew Chew 2 tablets daily. 60 tablet 5 01/07/2020 at Unknown time   ??? MVW Complete, pediatric multivit 61-D3-vit K, (MVW COMPLETE FORMULATION) 1,500-800 unit-mcg cap Take 1 capsule by mouth daily. 30 capsule 0 01/07/2020 at Unknown time   ???  mycophenolate (MYFORTIC) 180 MG EC tablet Take 3 tablets (540 mg total) by mouth two (2) times a day. 180 tablet 11 01/07/2020 at Unknown time   ??? ondansetron (ZOFRAN-ODT) 4 MG disintegrating tablet Take 1 tablet (4 mg total) by mouth every eight (8) hours as needed for up to 7 days. 10 tablet 0 More than a month at Unknown time   ??? pantoprazole (PROTONIX) 40 MG tablet Take 1 tablet (40 mg total) by mouth daily. 30 tablet 5 01/07/2020 at Unknown time   ??? predniSONE (DELTASONE) 20 MG tablet Take 1 tablet (20 mg total) by mouth daily. 30 tablet 0 01/07/2020 at 0800   ??? rifAXIMin (XIFAXAN) 550 mg Tab Take 1 tablet (550 mg total) by mouth Two (2) times a day. 60 tablet 4 01/07/2020 at 1900   ??? spironolactone (ALDACTONE) 100 MG tablet Take 1 tablet (100 mg total) by mouth daily. 30 tablet 0 Past Week at Unknown time   ??? sulfamethoxazole-trimethoprim (BACTRIM) 400-80 mg per tablet Take 1 tablet (80 mg of trimethoprim total) by mouth Every Monday, Wednesday, and Friday. 12 tablet 4 01/06/2020 at 0800   ??? tacrolimus (PROGRAF) 0.5 MG capsule Take 1 capsule (0.5 mg total) by mouth two (2) times a day. 60 capsule 11 01/06/2020 at 1900   ??? thiamine (B-1) 100 MG tablet Take 1 tablet (100 mg total) by mouth daily for 60 doses. 100 tablet 0 01/06/2020 at 0800   ??? valGANciclovir (VALCYTE) 450 mg tablet Take 1 tablet (450 mg total) by mouth daily. 30 tablet 4 01/06/2020 at 0800   ??? albuterol HFA 90 mcg/actuation inhaler Inhale 2 puffs every four (4) hours as needed (intractable coughing). 1 Inhaler 2    ??? benzonatate (TESSALON) 100 MG capsule Take by mouth daily as needed.      ??? ergocalciferol (DRISDOL) 1,250 mcg (50,000 unit) capsule Take 1 capsule (50,000 Units total) by mouth once a week for 3 doses. 3 capsule 0      Current Facility-Administered Medications   Medication Dose Route Frequency Provider Last Rate Last Admin   ??? albumin human 25 % bottle 25 g  25 g Intravenous TID Alfonse Ras, MD   25 g at 01/08/20 1413   ??? albuterol 2.5 mg /3 mL (0.083 %) nebulizer solution 2.5 mg  2.5 mg Nebulization Q6H PRN Melinda Crutch, MD       ??? cefepime (MAXIPIME) 2 g in sodium chloride 0.9 % (NS) 100 mL IVPB-connector bag  2 g Intravenous Q12H Lanier Prude, MD       ??? heparin 1,000 units/500 mL (2 units/mL) in 0.9% NaCl infusion  3 mL/hr Intravenous Continuous Melinda Crutch, MD 3 mL/hr at 01/08/20 1300 3 mL/hr at 01/08/20 1300   ??? hydrocortisone sod succ (Solu-CORTEF) injection 50 mg  50 mg Intravenous Q6H Melinda Crutch, MD   50 mg at 01/08/20 1420   ??? HYDROmorphone (PF) (DILAUDID) injection 0.5 mg  0.5 mg Intravenous Q3H PRN Alfonse Ras, MD   0.5 mg at 01/08/20 1313   ??? lactulose (CHRONULAC) oral solution  10 g Enteral tube: gastric  TID PRN Alfonse Ras, MD       ??? metroNIDAZOLE (FLAGYL) 5 mg/mL in sodium chloride 0.9% injection 250 mg  250 mg Intravenous Q8H [place] E Reasor, MD   Stopped at 01/08/20 1249   ??? norepinephrine 8 mg in sodium chloride 0.9 % 250 mL (0.032 mg/mL) infusion  0-30 mcg/min Intravenous Continuous [place] E Reasor, MD 24.4 mL/hr at  01/08/20 1423 13 mcg/min at 01/08/20 1423   ??? pantoprazole dilution (PROTONIX) 0.8 mg/mL injection 40 mg  40 mg Intravenous Daily Melinda Crutch, MD   Stopped at 01/08/20 [phone]   ??? phytonadione (vitamin K1) (AQUA-MEPHYTON) 10 mg in sodium chloride (NS) 0.9 % 50 mL IVPB  10 mg Intravenous Daily Alfonse Ras, MD   Stopped at 01/08/20 1242   ??? sodium chloride 77 mEq/L, sodium acetate 77 mEq/L in dextrose 5 %, sterile water 1,000 mL infusion  100 mL/hr Intravenous Continuous [place] E Reasor, MD 100 mL/hr at 01/08/20 1300 Rate Verify at 01/08/20 1300   ??? tacrolimus (PROGRAF) oral suspension  0.5 mg Enteral tube: gastric  BID Early Osmond, MD         Allergies   Allergen Reactions   ??? Bee Pollen Rash     Pt has seasonal allergies that cause excessive sneezing and running nose- [name], NAII   ??? Pollen Extracts Rash     Pt has seasonal allergies that cause excessive sneezing and running nose- [name], NAII     Social History     Tobacco Use   ??? Smoking status: Never Smoker   ??? Smokeless tobacco: Never Used   ??? Tobacco comment: Step-father smokes outside   Substance Use Topics   ??? Alcohol use: Never     Alcohol/week: 0.0 standard drinks     Frequency: Never     Comment: 1/2 glass of wine on rare occasions   ??? Drug use: Never       Objective:    Temp:  [37 ??C-37.9 ??C] 37 ??C  Heart Rate:  [110-120] 115  SpO2 Pulse:  [110-120] 116  Resp:  [13-21] 13  BP: (94-121)/(58-85) 103/68  MAP (mmHg):  [79-96] 79  A BP-1: (117-142)/(60-69) 117/60  MAP:  [75 mmHg-85 mmHg] 75 mmHg  FiO2 (%):  [100 %] 100 %  SpO2:  [100 %] 100 %    Physical Exam:  General appearance: chronically ill appearing. Moribund  HEENT: Scleral icterus  Cardiovascular: Tachycardic  Pulmonary: Normal work of breathing. Acyanotic  Abdominal: soft, nontender, nondistended, no masses or organomegaly.  Musculoskeletal: + temporal wasting. Neurologic: non-responsive  Extremities: no edema    Diagnostic Studies:  Labs:    CBC:  Recent Labs   Lab Units 01/08/20  0016 01/08/20  0014 01/03/20  1301   WBC 10*9/L 15.0*  --  16.2*   NEUTRO ABS 10*9/L 12.6*  --  14.3*   HEMOGLOBIN g/dL 7.8*  --  96.0*   HEMOGLOBIN BG g/dL  --  6.7*  --    PLATELET COUNT (1) 10*9/L 81*  --  242     BMP:  Recent Labs   Lab Units 01/08/20  0808 01/08/20  0205 01/08/20  0016 01/08/20  0014 01/03/20  1301   SODIUM WHOLE BLOOD mmol/L 134* 130*  --  129*  --    SODIUM mmol/L 133*  --  130*  --  125*   POTASSIUM WHOLE BLOOD mmol/L 4.0 3.7  --  3.4  --    POTASSIUM mmol/L 4.0  --  3.8  --  5.8*   CHLORIDE mmol/L 110*  --  110*  --  102   CO2 mmol/L  --   --   --   --  6.0*   BUN mg/dL 38*  --  37*  --  49*   CREATININE mg/dL 4.54*  --  0.98*  --  1.19*   BUN / CREAT RATIO  21  --  22  --  27   GLUCOSE mg/dL 161*  --  096  --  045*   CALCIUM mg/dL 8.0*  --  7.8*  --  9.4     Liver Panel:  Recent Labs   Lab Units 01/08/20  0808 01/08/20  0017 01/08/20  0016 01/03/20  1301   PT sec  --  26.7*  --   --    INR   --  2.34  --   --    ALBUMIN g/dL 2.3*  --  1.9* 3.5   PROTEIN TOTAL g/dL  --   --   --  7.4   BILIRUBIN TOTAL mg/dL 40.9*  --  81.1* 91.4*   BILIRUBIN DIRECT mg/dL 78.29*  --   --   --    AST U/L 693*  --  566* 447*   ALT U/L  --   --   --  458*   ALK PHOS U/L 1,362*  --  1,031* 1,837*     Anemia:  MCV   Date Value Ref Range Status   01/08/2020 86.5 80.0 - 100.0 fL Final   02/11/2019 77 (L) 79 - 97 fL Final     RDW   Date Value Ref Range Status   01/08/2020 25.3 (H) 12.0 - 15.0 % Final   02/11/2019 17.4 (H) 11.6 - 15.4 % Final     Ferritin   Date Value Ref Range Status   02/05/2018 22.2 10.0 - 300.0 ng/mL Final     Iron Saturation (%)   Date Value Ref Range Status   09/30/2019 81 (H) 20 - 50 % Final     TIBC   Date Value Ref Range Status   09/30/2019 232.5 (L) 252.0 - 479.0 mg/dL Final     Luminal etiologies:  IgA   Date Value Ref Range Status   09/30/2019 69.4 40.0 - 400.0 mg/dL Final     CRP   Date Value Ref Range Status   12/11/2019 34.7 (H) <10.0 mg/L Final     Sed Rate   Date Value Ref Range Status   08/22/2019 25 (H) 0 - 15 mm/h Final     HGB A1C, POC   Date Value Ref Range Status   03/29/2018 6.2 <7.0 % Final     Comment:     A1c Glycemic Goal**        Age Group              <7%                     >=18 years  **Goals should be individualized; more or less stringent A1c glycemic goals may be appropriate for individual patients.      (Adopted from: 2018 ADA Standards of Medical Care In Diabetes)  Point of Care A1c testing is not FDA-approved for the diagnosis of Diabetes.     Hemoglobin A1C   Date Value Ref Range Status   10/24/2019 4.6 (L) 4.8 - 5.6 % Final     TSH   Date Value Ref Range Status   02/11/2018 0.797 0.500 - 4.500 uIU/mL Final     Free T4   Date Value Ref Range Status   02/11/2018 1.49 0.80 - 2.00 ng/dL Final     Liver etiologies:  Hep A IgG   Date Value Ref Range Status   05/15/2016 Reactive (A) Nonreactive Final     Hep B Core Total Ab   Date  Value Ref Range Status   09/30/2019 Nonreactive Nonreactive Final     Hep B S Ab   Date Value Ref Range Status   09/30/2019 Reactive (A) Nonreactive, Grayzone Final     Comment:     Nonreactive and Grayzone results are considered non-immune.     Hepatitis C Ab   Date Value Ref Range Status   09/30/2019 Nonreactive Nonreactive Final     Comment:     Antibodies to HCV were not detected.  A nonreactive result does not exclude the possibility of exposure to HCV.     Antinuclear Antibodies (ANA)   Date Value Ref Range Status   09/30/2019 Negative Negative Final     Smooth Muscle Ab   Date Value Ref Range Status   09/30/2019 Negative Negative Final     Comment:        -------------------ADDITIONAL INFORMATION-------------------  This test was developed and its performance characteristics   determined by [facility] in a manner consistent with CLIA   requirements. This test has not been cleared or approved by   the U.S. Food and Drug Administration.     Test Performed by:  [facility]  [address] [place], [place], [place] [address]  Lab Director: Paul Dykes M.D. Ph.D.; CLIA# [id number]     Anti Mitochondrial Ab   Date Value Ref Range Status   09/30/2019 Negative Negative Final     Anti-Mitochondrial Level   Date Value Ref Range Status   09/30/2019 2.7  Final     IgM   Date Value Ref Range Status   09/30/2019 130 35 - 290 mg/dL Final     Total IgG   Date Value Ref Range Status   09/30/2019 1,122 600-1,700 mg/dL Final     Tumor markers:  AFP-Tumor Marker   Date Value Ref Range Status   08/22/2016 1.63 <7.51 ng/mL Final       Imaging:   Xr Chest Portable    Result Date: 01/08/2020  EXAM: XR CHEST PORTABLE DATE: 01/08/2020 4:13 AM ACCESSION: [id number] UN DICTATED: 01/08/2020 8:30 AM INTERPRETATION LOCATION: [place] [facility]     CLINICAL INDICATION: 19 years old Male with COUGH      COMPARISON: Single view of the chest 12/19/2019     TECHNIQUE:  Single AP portable semiupright view of the chest     FINDINGS:     No significant change in appearance of the cardiomediastinal silhouette.  Elevation of the left hemidiaphragm, as before. Otherwise, lungs are clear of acute processes. Resolution of previously seen effusions and bibasilar opacities.         No acute cardiopulmonary process is identified.     Xr Abdomen 1 View    Result Date: 01/08/2020  EXAM: XR ABDOMEN 1 VIEW DATE: 01/08/2020 12:38 PM ACCESSION: [id number] UN DICTATED: 01/08/2020 1:07 PM INTERPRETATION LOCATION: [place] [facility]     CLINICAL INDICATION: 19 year old Male with NGT (CATHETER VASCULAR FIT & ADJ). Septic shock.     COMPARISON: Portable supine views of the abdomen 12/16/2019     TECHNIQUE: Portable AP supine view of abdomen     FINDINGS: NG tube tip is in the stomach with the side-port projecting near the GE junction/distal esophagus. Consider advancement.     Right upper quadrant surgical clips are redemonstrated.  Mild gaseous distention of the bowel.        NG tube tip is in the stomach with the side-port near the GE junction. Consider advancement.  Microbiology:  No results found for: LABBLOO  No results found for: ASCPR  No results found for: LABURIN    GI Procedures:   Esophagogastroduodenoscopy (EGD) - 12/13/19 -   Findings:       A small amount of liquid and solid food particles was found in the        entire esophagus.       Diffuse, white plaques were found in the proximal esophagus and in the        mid esophagus.       LA Grade B (one or more mucosal breaks greater than 5 mm, not extending        between the tops of two mucosal folds) esophagitis with no bleeding was        found in the distal esophagus and hiatal hernia.       A small hiatal hernia was found.       The exam of the stomach was otherwise normal.       The examined duodenum was normal.       A guide wire was inserted into the duodenum and the endoscope was        removed. A 14 Fr nasojejunal tube was advanced over the guide wire into        the duodenum. Placement was confirmed by scope visualization. The        endoscope was reinserted along side the gastric tube to visualize tube        insertion in proximal esophagus.                                                                                   Impression:            - Food in the esophagus.                         - Esophageal plaques were found, suspicious for                          candidiasis.                         - LA Grade B esophagitis with no bleeding.                         - Small hiatal hernia.                         - Normal examined duodenum.                         - Feeding tube placement was successfully performed.                         - No specimens collected.    Esophagogastroduodenoscopy (EGD) - 09/21/19 -   Findings:       One column of grade I varices with no bleeding and no stigmata of recent  bleeding were found in the lower third of the esophagus,. No red wale signs were present. Stigmata of prior treatment were evident.       A medium amount of food (residue) was found in the gastric fundus. This        solid food obstructed visualization of the fundus, therefore gastric        varices could not be ruled out.       The exam of the stomach was otherwise normal.       The examined duodenum was normal.                                                                                   Impression:            - Grade I esophageal varices with no bleeding and no                          stigmata of recent bleeding.                         - A medium amount of food (residue) in the stomach.                          This solid food obstructed visualization of the                          fundus, therefore gastric varices could not be ruled                          out.                         - Normal examined duodenum.                         - No specimens collected.    Endoscopic retrograde cholangiopancreatogram (ERCP) - 06/04/18 -   Findings:       A biliary stent was visible on the scout film. The scope was advanced to        a normal major papilla in the descending duodenum. Examination of the        pharynx, larynx and associated structures, and upper GI tract was        normal. One covered metal stent originating in the biliary tree was        emerging from the major papilla. The stent was visibly patent. A biliary        sphincterotomy had been performed. The sphincterotomy appeared open. One        stent was removed from the biliary tree using a rat-toothed forceps. The        bile duct was deeply cannulated with the 12 mm balloon. Contrast was        injected. I personally interpreted the bile duct images. Ductal flow of        contrast was adequate. Image quality was adequate.  Contrast extended to        the hepatic ducts. The upper third of the main bile duct contained        filling defect(s) thought to be a stone and sludge. There was no        stenosis noted in the post-transplant anastomosis. 0.035 inch x 260 cm        straight Hydra Jagwire was passed into the biliary tree. The biliary        tree was swept with a 12 mm balloon starting at the right main hepatic        duct. Sludge was swept from the duct. All stones were removed.                                                                                   Impression:        - One visibly patent stent from the biliary tree was seen                      in the major papilla.                     - Prior biliary sphincterotomy appeared open.                     - A filling defect consistent with a stone and sludge was                      seen on the cholangiogram.                     - One stent was removed from the biliary tree.                     - The biliary tree was swept.                     - Choledocholithiasis was found. Complete removal was                      accomplished by balloon extraction.

## 2020-01-08 NOTE — Unmapped (Signed)
Patient admited to picu and remains stable.  N- lethargic on admission, intermittently responsive, PERRLA, Morphine given x3 with desired effect. Bicarb given x1.   R- Stable on RA, rhonchi and snoring. Chest Xray completed.   CV- Tmax 37.9, Hrs 110s-120s, Norepi at 64mcg/min goal MAPs >65. SBPs 100-110s. Warm well perfused, pulses 1+ in all 4 extremities.   GI- NPO, no BM  GU- low urine output with condom cath on, MD notified, will consider bladder scan in AM.   Access- PIV R hand, PIV L AC, PIV L hand  Skin- Jaundice and dry skin dark spots on chest   Psych- mom at bedside updated    Problem: Adult Inpatient Plan of Care  Goal: Plan of Care Review  Outcome: Ongoing - Unchanged  Goal: Patient-Specific Goal (Individualization)  Outcome: Ongoing - Unchanged  Goal: Absence of Hospital-Acquired Illness or Injury  Outcome: Ongoing - Unchanged  Goal: Optimal Comfort and Wellbeing  Outcome: Ongoing - Unchanged  Goal: Readiness for Transition of Care  Outcome: Ongoing - Unchanged  Goal: Rounds/Family Conference  Outcome: Ongoing - Unchanged     Problem: Self-Care Deficit  Goal: Improved Ability to Complete Activities of Daily Living  Outcome: Ongoing - Unchanged     Problem: Fall Injury Risk  Goal: Absence of Fall and Fall-Related Injury  Outcome: Ongoing - Unchanged     Problem: Skin Injury Risk Increased  Goal: Skin Health and Integrity  Outcome: Ongoing - Unchanged

## 2020-01-08 NOTE — Unmapped (Signed)
Pediatric Vancomycin Therapeutic Monitoring Pharmacy Note    [name] Gross is a 19 y.o. male continuing vancomycin. Date of therapy initiation: 01/07/20    Indication: Empiric therapy    Prior Dosing Information: Last dose: Vancomycin 1000 mg (~19 mg/kg) IV x 1 dose on 2/27 @ 1900    Dosing Weight: 52.9 kg    Goals:  Therapeutic Drug Levels  Vancomycin random goal: <20 mg/L    Additional Clinical Monitoring/Outcomes  Renal function, volume status (intake and output)    Results: Vancomycin random level:13.8 mg/L, drawn ~ 13 hours after last dose    Wt Readings from Last 1 Encounters:   01/08/20 51.8 kg (114 lb 3.2 oz) (1 %, Z= -2.21)*     * Growth percentiles are based on CDC (Boys, 2-20 Years) data.     Creatinine   Date Value Ref Range Status   01/08/2020 1.72 (H) 0.70 - 1.30 mg/dL Final   16/08/9603 5.40 (H) 0.70 - 1.30 mg/dL Final   98/09/9146 8.29 0.70 - 1.30 mg/dL Final        UOP: minimal to none    Pharmacokinetic Considerations and Significant Drug Interactions:  ? Dosed by levels  ? Concurrent nephrotoxic meds: not applicable    Assessment/Plan:  -  Random vancomycin level <20 mg/ml and therefore, appropriate to re-dose patient.   Recommendation(s)  ? Administer vancomycin 1000 mg (19 mg/kg) IV once  ? Maintain adequate hydration. Patient is currently receiving fluids at 100 mL/hr   ? Re-assess in 12 hours and if level <20 mg/mL, may re-dose x 1.     Follow-up  ? Next level ordered: 03/01 @ 0200  ? A pharmacist will continue to monitor and order levels as appropriate    Please page service pharmacist with questions/clarifications.    Emmit Alexanders, PharmD   PGY-2 Pediatric Pharmacy Resident  [phone] (pharmacy phone); [phone] (pager)

## 2020-01-09 LAB — SLIDE REVIEW

## 2020-01-09 LAB — CBC W/ AUTO DIFF
BASOPHILS ABSOLUTE COUNT: 0.1 10*9/L (ref 0.0–0.1)
BASOPHILS ABSOLUTE COUNT: 0.1 10*9/L (ref 0.0–0.1)
BASOPHILS RELATIVE PERCENT: 0.5 %
EOSINOPHILS ABSOLUTE COUNT: 0 10*9/L (ref 0.0–0.4)
EOSINOPHILS RELATIVE PERCENT: 0.1 %
HEMATOCRIT: 22.1 % — ABNORMAL LOW (ref 41.0–53.0)
HEMOGLOBIN: 6 g/dL — ABNORMAL LOW (ref 13.5–17.5)
HEMOGLOBIN: 7.5 g/dL — ABNORMAL LOW (ref 13.5–17.5)
LARGE UNSTAINED CELLS: 1 % (ref 0–4)
LARGE UNSTAINED CELLS: 0 % (ref 0–4)
LYMPHOCYTES ABSOLUTE COUNT: 0.4 10*9/L — ABNORMAL LOW (ref 1.5–5.0)
LYMPHOCYTES ABSOLUTE COUNT: 0.3 10*9/L — ABNORMAL LOW (ref 1.5–5.0)
LYMPHOCYTES RELATIVE PERCENT: 2.7 %
LYMPHOCYTES RELATIVE PERCENT: 2.8 %
MEAN CORPUSCULAR HEMOGLOBIN CONC: 32.7 g/dL (ref 31.0–37.0)
MEAN CORPUSCULAR HEMOGLOBIN CONC: 34.1 g/dL (ref 31.0–37.0)
MEAN CORPUSCULAR HEMOGLOBIN: 27.9 pg (ref 26.0–34.0)
MEAN CORPUSCULAR HEMOGLOBIN: 29.5 pg (ref 26.0–34.0)
MEAN CORPUSCULAR VOLUME: 85.1 fL (ref 80.0–100.0)
MEAN CORPUSCULAR VOLUME: 86.4 fL (ref 80.0–100.0)
MEAN PLATELET VOLUME: 6.6 fL — ABNORMAL LOW (ref 7.0–10.0)
MEAN PLATELET VOLUME: 6.7 fL — ABNORMAL LOW (ref 7.0–10.0)
MONOCYTES ABSOLUTE COUNT: 1.2 10*9/L — ABNORMAL HIGH (ref 0.2–0.8)
MONOCYTES RELATIVE PERCENT: 8.6 %
MONOCYTES RELATIVE PERCENT: 3.4 %
NEUTROPHILS ABSOLUTE COUNT: 12.2 10*9/L — ABNORMAL HIGH (ref 2.0–7.5)
NEUTROPHILS ABSOLUTE COUNT: 11.5 10*9/L — ABNORMAL HIGH (ref 2.0–7.5)
NEUTROPHILS RELATIVE PERCENT: 87.4 %
NEUTROPHILS RELATIVE PERCENT: 92.9 %
NUCLEATED RED BLOOD CELLS: 1 /100{WBCs} (ref <=4)
PLATELET COUNT: 136 10*9/L — ABNORMAL LOW (ref 150–440)
PLATELET COUNT: 118 10*9/L — ABNORMAL LOW (ref 150–440)
RED BLOOD CELL COUNT: 2.17 10*12/L — ABNORMAL LOW (ref 4.50–5.90)
RED BLOOD CELL COUNT: 2.56 10*12/L — ABNORMAL LOW (ref 4.50–5.90)
RED CELL DISTRIBUTION WIDTH: 25.9 % — ABNORMAL HIGH (ref 12.0–15.0)
RED CELL DISTRIBUTION WIDTH: 22.4 % — ABNORMAL HIGH (ref 12.0–15.0)
WBC ADJUSTED: 12.4 10*9/L — ABNORMAL HIGH (ref 4.5–11.0)

## 2020-01-09 LAB — PHOSPHORUS
Phosphate:MCnc:Pt:Ser/Plas:Qn:: 3.2
Phosphate:MCnc:Pt:Ser/Plas:Qn:: 3.6
Phosphate:MCnc:Pt:Ser/Plas:Qn:: 5.2 — ABNORMAL HIGH
Phosphate:MCnc:Pt:Ser/Plas:Qn:: 8.6 — ABNORMAL HIGH

## 2020-01-09 LAB — BLOOD GAS CRITICAL CARE PANEL, ARTERIAL
BASE EXCESS ARTERIAL: -9.1 — ABNORMAL LOW (ref -2.0–2.0)
BASE EXCESS ARTERIAL: -7.3 — ABNORMAL LOW (ref -2.0–2.0)
BASE EXCESS ARTERIAL: -6.1 — ABNORMAL LOW (ref -2.0–2.0)
CALCIUM IONIZED ARTERIAL (MG/DL): 4.48 mg/dL (ref 4.40–5.40)
CALCIUM IONIZED ARTERIAL (MG/DL): 4.23 mg/dL — ABNORMAL LOW (ref 4.40–5.40)
CALCIUM IONIZED ARTERIAL (MG/DL): 4.34 mg/dL — ABNORMAL LOW (ref 4.40–5.40)
GLUCOSE WHOLE BLOOD: 235 mg/dL — ABNORMAL HIGH (ref 70–179)
GLUCOSE WHOLE BLOOD: 248 mg/dL — ABNORMAL HIGH (ref 70–179)
GLUCOSE WHOLE BLOOD: 277 mg/dL — ABNORMAL HIGH (ref 70–179)
HCO3 ARTERIAL: 15 mmol/L — ABNORMAL LOW (ref 22–27)
HCO3 ARTERIAL: 17 mmol/L — ABNORMAL LOW (ref 22–27)
HCO3 ARTERIAL: 17 mmol/L — ABNORMAL LOW (ref 22–27)
HEMOGLOBIN BLOOD GAS: 4.9 g/dL — CL (ref 13.50–17.50)
HEMOGLOBIN BLOOD GAS: 6.8 g/dL — ABNORMAL LOW (ref 13.50–17.50)
LACTATE BLOOD ARTERIAL: 1.5 mmol/L — ABNORMAL HIGH (ref <1.3)
LACTATE BLOOD ARTERIAL: 1.7 mmol/L — ABNORMAL HIGH (ref <1.3)
O2 SATURATION ARTERIAL: 99.8 % (ref 94.0–100.0)
O2 SATURATION ARTERIAL: 98.3 % (ref 94.0–100.0)
PCO2 ARTERIAL: 27.7 mmHg — ABNORMAL LOW (ref 35.0–45.0)
PH ARTERIAL: 7.41 (ref 7.35–7.45)
PH ARTERIAL: 7.4 (ref 7.35–7.45)
PH ARTERIAL: 7.46 — ABNORMAL HIGH (ref 7.35–7.45)
PO2 ARTERIAL: 175 mmHg — ABNORMAL HIGH (ref 80.0–110.0)
PO2 ARTERIAL: 177 mmHg — ABNORMAL HIGH (ref 80.0–110.0)
POTASSIUM WHOLE BLOOD: 2.9 mmol/L — ABNORMAL LOW (ref 3.4–4.6)
POTASSIUM WHOLE BLOOD: 2.7 mmol/L — ABNORMAL LOW (ref 3.4–4.6)
POTASSIUM WHOLE BLOOD: 2.4 mmol/L — CL (ref 3.4–4.6)
SODIUM WHOLE BLOOD: 143 mmol/L (ref 135–145)

## 2020-01-09 LAB — BASIC METABOLIC PANEL
BLOOD UREA NITROGEN: 41 mg/dL — ABNORMAL HIGH (ref 7–21)
BLOOD UREA NITROGEN: 42 mg/dL — ABNORMAL HIGH (ref 7–21)
BLOOD UREA NITROGEN: 43 mg/dL — ABNORMAL HIGH (ref 7–21)
BUN / CREAT RATIO: 20
BUN / CREAT RATIO: 22
CALCIUM: 7.7 mg/dL — ABNORMAL LOW (ref 8.5–10.2)
CALCIUM: 7.8 mg/dL — ABNORMAL LOW (ref 8.5–10.2)
CALCIUM: 8.1 mg/dL — ABNORMAL LOW (ref 8.5–10.2)
CHLORIDE: 113 mmol/L — ABNORMAL HIGH (ref 98–107)
CHLORIDE: 113 mmol/L — ABNORMAL HIGH (ref 98–107)
CHLORIDE: 116 mmol/L — ABNORMAL HIGH (ref 98–107)
CREATININE: 2.06 mg/dL — ABNORMAL HIGH (ref 0.70–1.30)
EGFR CKD-EPI AA MALE: 52 mL/min/{1.73_m2} — ABNORMAL LOW (ref >=60)
EGFR CKD-EPI AA MALE: 57 mL/min/{1.73_m2} — ABNORMAL LOW (ref >=60)
EGFR CKD-EPI AA MALE: 55 mL/min/{1.73_m2} — ABNORMAL LOW (ref >=60)
EGFR CKD-EPI NON-AA MALE: 45 mL/min/{1.73_m2} — ABNORMAL LOW (ref >=60)
EGFR CKD-EPI NON-AA MALE: 49 mL/min/{1.73_m2} — ABNORMAL LOW (ref >=60)
EGFR CKD-EPI NON-AA MALE: 48 mL/min/{1.73_m2} — ABNORMAL LOW (ref >=60)
GLUCOSE RANDOM: 201 mg/dL — ABNORMAL HIGH (ref 70–99)
GLUCOSE RANDOM: 248 mg/dL — ABNORMAL HIGH (ref 70–179)
GLUCOSE RANDOM: 268 mg/dL — ABNORMAL HIGH (ref 70–179)
POTASSIUM: 2.8 mmol/L — ABNORMAL LOW (ref 3.5–5.0)
POTASSIUM: 2.6 mmol/L — CL (ref 3.5–5.0)
SODIUM: 139 mmol/L (ref 135–145)
SODIUM: 141 mmol/L (ref 135–145)

## 2020-01-09 LAB — SPHEROCYTES

## 2020-01-09 LAB — EGFR CKD-EPI NON-AA MALE
Glomerular filtration rate/1.73 sq M.predicted.non black:ArVRat:Pt:Ser/Plas/Bld:Qn:Creatinine-based formula (CKD-EPI): 45 — ABNORMAL LOW

## 2020-01-09 LAB — INR: Coagulation tissue factor induced.INR:RelTime:Pt:PPP:Qn:Coag: 2.6

## 2020-01-09 LAB — FIBRINOGEN LEVEL
Fibrinogen:MCnc:Pt:PPP:Qn:Coag: 163 — ABNORMAL LOW
Fibrinogen:MCnc:Pt:PPP:Qn:Coag: 181

## 2020-01-09 LAB — BURR CELLS

## 2020-01-09 LAB — HEPATIC FUNCTION PANEL
ALBUMIN: 2.5 g/dL — ABNORMAL LOW (ref 3.5–5.0)
ALKALINE PHOSPHATASE: 889 U/L — ABNORMAL HIGH (ref 65–260)
ALKALINE PHOSPHATASE: 713 U/L — ABNORMAL HIGH (ref 65–260)
BILIRUBIN DIRECT: 37.4 mg/dL — ABNORMAL HIGH (ref 0.00–0.40)
BILIRUBIN DIRECT: 31.8 mg/dL — ABNORMAL HIGH (ref 0.00–0.40)
BILIRUBIN TOTAL: 41 mg/dL — ABNORMAL HIGH (ref 0.0–1.2)
BILIRUBIN TOTAL: 37.5 mg/dL — ABNORMAL HIGH (ref 0.0–1.2)

## 2020-01-09 LAB — MAGNESIUM
Magnesium:MCnc:Pt:Ser/Plas:Qn:: 1.7
Magnesium:MCnc:Pt:Ser/Plas:Qn:: 2
Magnesium:MCnc:Pt:Ser/Plas:Qn:: 2
Magnesium:MCnc:Pt:Ser/Plas:Qn:: 2.1

## 2020-01-09 LAB — PCO2 ARTERIAL: Carbon dioxide:PPres:Pt:BldA:Qn:: 27.7 — ABNORMAL LOW

## 2020-01-09 LAB — D-DIMER QUANTITATIVE (CH,ML,PD,ET)
Fibrin D-dimer DDU:MCnc:Pt:PPP:Qn:: 393 — ABNORMAL HIGH
Fibrin D-dimer DDU:MCnc:Pt:PPP:Qn:: 513 — ABNORMAL HIGH

## 2020-01-09 LAB — APTT
APTT: 49.5 s — ABNORMAL HIGH (ref 25.3–37.1)
Coagulation surface induced:Time:Pt:PPP:Qn:Coag: 56.3 — ABNORMAL HIGH

## 2020-01-09 LAB — SODIUM WHOLE BLOOD: Sodium:SCnc:Pt:Bld:Qn:: 139

## 2020-01-09 LAB — VANCOMYCIN RANDOM
Vancomycin^random:MCnc:Pt:Ser/Plas:Qn:: 20.2
Vancomycin^random:MCnc:Pt:Ser/Plas:Qn:: 24.8

## 2020-01-09 LAB — ALKALINE PHOSPHATASE: Alkaline phosphatase:CCnc:Pt:Ser/Plas:Qn:: 889 — ABNORMAL HIGH

## 2020-01-09 LAB — LYMPHOCYTES RELATIVE PERCENT
Lymphocytes/100 leukocytes:NFr:Pt:Bld:Qn:Automated count: 2.7
Lymphocytes/100 leukocytes:NFr:Pt:Bld:Qn:Automated count: 2.8

## 2020-01-09 LAB — TACROLIMUS, TROUGH: Lab: 2.2 — ABNORMAL LOW

## 2020-01-09 LAB — FIBRINOGEN: FIBRINOGEN LEVEL: 163 mg/dL — ABNORMAL LOW (ref 177–386)

## 2020-01-09 LAB — HEPARIN CORRELATION: Lab: 0.3

## 2020-01-09 LAB — PROTIME: Coagulation tissue factor induced:Time:Pt:PPP:Qn:Coag: 33.6 — ABNORMAL HIGH

## 2020-01-09 LAB — VANCOMYCIN, RANDOM: VANCOMYCIN RANDOM: 24.8 ug/mL

## 2020-01-09 LAB — PROTEIN TOTAL: Protein:MCnc:Pt:Ser/Plas:Qn:: 0

## 2020-01-09 LAB — CREATININE
Creatinine:MCnc:Pt:Ser/Plas:Qn:: 1.93 — ABNORMAL HIGH
Creatinine:MCnc:Pt:Ser/Plas:Qn:: 1.98 — ABNORMAL HIGH

## 2020-01-09 LAB — SPECIMEN SOURCE

## 2020-01-09 MED ORDER — HEPARIN (PORCINE) IN NACL 1000-0.9 UT/500ML-% IV SOLN
3.00 | INTRAVENOUS | Status: DC
Start: ? — End: 2020-01-09

## 2020-01-09 MED ORDER — GENERIC EXTERNAL MEDICATION
0.50 | Status: DC
Start: 2020-01-09 — End: 2020-01-09

## 2020-01-09 MED ORDER — GENERIC EXTERNAL MEDICATION
0.00 | Status: DC
Start: ? — End: 2020-01-09

## 2020-01-09 MED ORDER — ALBUTEROL SULFATE (2.5 MG/3ML) 0.083% IN NEBU
2.50 | INHALATION_SOLUTION | RESPIRATORY_TRACT | Status: DC
Start: ? — End: 2020-01-09

## 2020-01-09 MED ORDER — WH PETROL-MINERAL OIL-LANOLIN 0.1-0.1 % OP OINT
1.00 | TOPICAL_OINTMENT | OPHTHALMIC | Status: DC
Start: ? — End: 2020-01-09

## 2020-01-09 MED ORDER — LACTULOSE 10 GM/15ML PO SOLN
10.00 | ORAL | Status: DC
Start: 2020-01-09 — End: 2020-01-09

## 2020-01-09 MED ORDER — GENERIC EXTERNAL MEDICATION
40.00 | Status: DC
Start: 2020-01-10 — End: 2020-01-09

## 2020-01-09 MED ORDER — HYDROCORTISONE NA SUCCINATE PF 100 MG IJ SOLR
50.00 | INTRAMUSCULAR | Status: DC
Start: 2020-01-11 — End: 2020-01-09

## 2020-01-09 MED ORDER — CARBOXYMETHYLCELLULOSE SODIUM 1 % OP GEL
1.00 | OPHTHALMIC | Status: DC
Start: ? — End: 2020-01-09

## 2020-01-09 MED ORDER — GENERIC EXTERNAL MEDICATION
2.00 | Status: DC
Start: 2020-01-09 — End: 2020-01-09

## 2020-01-09 MED ORDER — GENERIC EXTERNAL MEDICATION
100.00 | Status: DC
Start: ? — End: 2020-01-09

## 2020-01-09 MED ORDER — ALBUMIN HUMAN 25 % IV SOLN
25.00 | INTRAVENOUS | Status: DC
Start: 2020-01-09 — End: 2020-01-09

## 2020-01-09 MED ORDER — METRONIDAZOLE IN NACL 5-0.79 MG/ML-% IV SOLN
250.00 | INTRAVENOUS | Status: DC
Start: 2020-01-09 — End: 2020-01-09

## 2020-01-09 MED ORDER — HYDROMORPHONE HCL 1 MG/ML IJ SOLN
0.50 | INTRAMUSCULAR | Status: DC
Start: ? — End: 2020-01-09

## 2020-01-09 MED ORDER — GENERIC EXTERNAL MEDICATION
10.00 | Status: DC
Start: 2020-01-09 — End: 2020-01-09

## 2020-01-09 MED FILL — SULFAMETHOXAZOLE 400 MG-TRIMETHOPRIM 80 MG TABLET: ORAL | 28 days supply | Qty: 12 | Fill #2

## 2020-01-09 MED FILL — VALGANCICLOVIR 450 MG TABLET: 30 days supply | Qty: 30 | Fill #2 | Status: AC

## 2020-01-09 MED FILL — XIFAXAN 550 MG TABLET: 30 days supply | Qty: 60 | Fill #2 | Status: AC

## 2020-01-09 MED FILL — VALGANCICLOVIR 450 MG TABLET: ORAL | 30 days supply | Qty: 30 | Fill #2

## 2020-01-09 MED FILL — SULFAMETHOXAZOLE 400 MG-TRIMETHOPRIM 80 MG TABLET: 28 days supply | Qty: 12 | Fill #2 | Status: AC

## 2020-01-09 MED FILL — MYCOPHENOLATE SODIUM 180 MG TABLET,DELAYED RELEASE: 30 days supply | Qty: 180 | Fill #0 | Status: AC

## 2020-01-09 MED FILL — XIFAXAN 550 MG TABLET: ORAL | 30 days supply | Qty: 60 | Fill #2

## 2020-01-09 NOTE — Unmapped (Signed)
Pt placed on BIPAP 12/6 per MD request; BIPAP settings and alarms verified with no changes; Will continue to monitor patient's care;

## 2020-01-09 NOTE — Unmapped (Signed)
PICU Progress Note    Interval events: CT head obtained overnight with concern for embolic infarcts. Neuro consulted. Hgb dropped from 7.3 to 6, so receiving 1u pRBCs. Placed on BiPAP overnight for concern for obstructing while sleeping, but has weaned to HFNC this morning. Weaned pressors to 2 of NE after 2nd dose of hydrocortisone. Received PRN dilaudid x3 over past 24 hours for pain.     LOS: 1 days    Assessment: [name] Gross is a 19 y.o. 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 Continuing antibiotics x48hours while awaiting culture data, but wonder if picture is more hypovolemic shock rather than infectious. Encephalopathy persists without improvement in mental status and now new concern for embolic events on head CT. Will follow-up with MRI. Ultimately, still remains very tenuous with unclear prognosis. Requires care in the PICU for close hemodynamic monitoring and ongoing challenging goals of care conversations.   ??  RESP:   - [place] 1L, WAT, consider transition back to BiPAP following MRI  - albuterol PRN  - ABG PRN+ q12  ??  CV: tachycardic, hypotensive on arrival  - CRM  - Norepi for MAP >60  - f/u echo given c/f embolic event  ??  Renal: AKI vs.hepatorenal  - Strict I/Os  - Chem 10 q12hr  - foley in place following stretch injury  - renally dose medications  ??  FEN/GI: follows with Dr. Melrose Nakayama. Elevated LFTs shock liver vs. rejection  - D5 at mIVF (1/2 NS and 1/2 sodium acetate)  - NPO, placing NGT and start trophic NG feeds once off pressors  - Liver doppler w/ patent hepatic transplant vasculature  - lactulose 10g BID, titrate to 3-5 BMs/day; will try lactulose enema x1 today  - Ped GI, transplant hepatology following  - continue tacro 0.5mg  BID (goal 4-6)  - hold MMF 540mg  BID given septic, will resume when able  - Home meds on hold: rifaximin, spironolactone, lasix 10mg  daily, Mag ox, MVI,  - IV protonix 40mg  daily  - Nutrition consult given severe protein-calorie malnutrition  - 25g of 25% albumin TID x3 days (2/28-3/2)  - Labs: chem10 q12, HFP, PT/INR q12  ??  HEME:   - IV Vitamin K 10mg  x3 days (2/28- )  - SCDs for DVT ppx, holding chemoppx per heme/onc  - heme/onc c/s   - DIC panel BID  - transfusion threshold Hgb <7, plts <20 (50 if bleeding)  ??  ID:   - Continue cefepime/flagyl/vanc, can continue discontinue tomorrow pending work-up  - F/u BCx x2 OSH: NG x2days  - Urine culture NG  - no ascites for diagnostic paracentesis, re-evaluate given lg vol asctes on formal ultrasound  - home ppx: valganciclovir and bactrim MWF    ENDO:   - start stress dose with hydrocortisone 50mg  q6 (started 2/28)  - resume home prednisone once off stress dose steroids  ??  NEURO:   - q2hr Neuro checks  - MRI wo contrast with stroke series imaging when stable  - IV dilaudid PRN  - holding home remeron  - Avoid tylenol/nsaids given acute renal and chronic liver failure  - PT/OT consult  - consider psychology/psychiatry consult if prolonged admission  ??  Social:  - SCT consult   ??  Access: PIV  x2    DVT ppx: SCDs     Changes to Lines/Tubes: A line placed; PIV x3   Family communication: updated at bedside   Dispo: PICU for monitoring    PICU Resident Pager: [phone]  PICU Resident Phone: [phone]    Vitals:  Dosing weight:    Most recent weight: 51.8 kg (114 lb 3.2 oz) (01/08/20 0005)    Temp:  [36.4 ??C-37 ??C] 36.7 ??C  Heart Rate:  [96-121] 105  SpO2 Pulse:  [96-117] 104  Resp:  [10-22] 19  BP: (87-116)/(43-78) 110/66  MAP (mmHg):  [55-88] 79  A BP-1: (97-142)/(41-70) 128/63  MAP:  [63 mmHg-87 mmHg] 80 mmHg  A BP-2: (145)/(73) 145/73  MAP:  [92 mmHg] 92 mmHg  FiO2 (%):  [30 %] 30 %  SpO2:  [100 %] 100 %     I/O       02/27 0701 - 02/28 0700 02/28 0701 - 03/01 0700 03/01 0701 - 03/02 0700    I.V. (mL/kg) 765.2 (14.8) 2914.6 (56.3) 231.2 (4.5)    Blood  215     NG/GT  10 31    IV Piggyback 75 608 58    Total Intake 840.2 3747.6 320.2    Urine (mL/kg/hr) 85 3232 (2.6) 150 (1.1)    Total Output(mL/kg) 85 (1.6) 3232 (62.4) 150 (2.9)    Net +755.2 +515.6 +170.2              Net + ~500cc/24hrs with appropriate UOP (2.5cc/kg/hr)    Physical Exam:  General: ill appearing, lying in bed, does not arouse to verbal or noxious stimuli questions or follows commands, cachetic appearing  HEENT: scleral icterus, oculocephalic reflex present; corneal reflex intact bilaterally  CV: tachycardic, no murmurs; 2+ pulses   Resp: lungs clear, no increased WOB  Abd: soft non tender, slightly distended  Ext: moving all extremities; skin diffusely jaundiced  Neuro: oriented x0, does not follow commands; will withdraw all 4 extremities to noxious stimuli; face symmetric at rest    Labs and studies reviewed. Pertinent results include  WBC 15 (ANC 12.6) --> 22--> 13  Hgb 7.8 --> 6  Plts 81 --> 136    Na 130 --> 139  K 3.4, Mg 2. Phos 3.5  Cl 113  Bicarb specimen icteric  BUN 37/Cr 1.7 --> 41/2  Albumin 1.9 --> 2.5  Tbili 36 --> 41  Dbili 37  AST 566 --> 434  ALT specimen icteric  Alk phos 1031 --> 889  PT/INR 27/2.3 --> 33/3  PTT 44  DDimer 393  Fibrinogen 181    ABG 7.4/24/175/15  Lactate 1.5    Lines/Tubes:   Patient Lines/Drains/Airways Status    Active Active Lines, Drains, & Airways     Name:   Placement date:   Placement time:   Site:   Days:    NG/OG Tube Feedings 14 Fr. Left nostril   01/08/20    1100    Left nostril   less than 1    Urethral Catheter Coude 14 Fr.   01/08/20    1300    Coude   less than 1    Peripheral IV 01/08/20 Posterior;Right Hand   01/08/20    0000    Hand   1    Peripheral IV 01/08/20 Left;Posterior Hand   01/08/20    0000    Hand   1    Peripheral IV (Ped) 01/08/20 Left Antecubital   01/08/20    ???  1    Arterial Line 01/08/20 Left Radial 01/08/20    0935    Radial   1

## 2020-01-09 NOTE — Unmapped (Signed)
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    Stroke: Unfortunately the patient's hypodensities on CT head likely represent embolic strokes.  Patients with cirrhosis are known to have higher risk of strokes due to dyslipidemia as well as being in hypercoagulable states (See article cited below).  At this time motor exam is nonfocal since he withdraws all extremities but does show signs of upper motor neuron dysfunction demonstrated by sustained clonus in the bilateral ankles.  Cortical function is depressed at this time demonstrated by patient's lack of arousal to both verbal and noxious stimulus however this is very common in patients have had strokes and multiple metabolic derangements going on at the same time.  Would want to see improvement in her metabolic labs prior to making any prognosis on mental status related to strokes.  MRI of the brain would be helpful and better localizing the extent of the strokes throughout the brain.  In addition doing a MRA time-of-flight of the head would be useful looking for signs of mycotic aneurysms in case there were septic emboli from other parts of the body.     Parikh NS, et al. Association between cirrhosis and stroke in a nationally representative cohort. JAMA Neurol.  2017 August 1.    Recommendations:   1.  MRI brain w/o using stroke series, MRA w/o time of flight looking for signs of mycotic aneurysms  2.  Agree with repeat echocardiogram  3.  No antiplatelet or anticoagulation indicated from a stroke standpoint at this time      This patient was seen and discussed with Dr. Seward Meth who agrees with the above assessment and plan.      Estill Bamberg, MD  Neurology, PGY 3  Consult Pager: [phone]         HPI        Reason for Consult: stroke    [name] Gross is a 19 y.o.  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    He has increasing fatigue and weakness the 5 days prior to admission.  He was seen 5 days prior to admission by his primary gastroenterologist who had recommended that he go to the emergency department due to hyponatremia, low bicarb and increased WBC as well as low blood pressures and poor oral intake.  Over the 4-5 days following this appointment he began increasingly more fatigued, felt just generalized weakness and continued to have decreasing mental status.  He presented to outside emergency department on 2/27 due to unresponsiveness and disorientation and then was subsequently transferred to [facility] PICU due to concerns of sepsis.  He never demonstrated signs of focal weakness but more just complains of being tired with less energy.  He has had a cough  for a couple weeks but otherwise has not had any vomiting, nausea, fevers, rash, rhinorrhea, abdominal pain, diarrhea or shortness of breath.    Here at [facility] he was unresponsive but maintaining his own airway and therefore received a head CT looking for signs of head bleed or causes of encephalopathy and subsequently found to have multiple infarcts.    At [facility] on 2/27 he had a Na of 123, CO2 9, BUN 49, AST/ALT in 600s, bilirubin 47, ammonia 49, WBC 22    Home immunosuppressant meds:  Mycophenolate, prednisone, tacrolimus    Allergies   Allergen Reactions   ??? Bee Pollen Rash Pt has seasonal allergies that cause excessive sneezing and running nose- [name], NAII   ??? Pollen Extracts Rash     Pt has seasonal allergies that cause excessive sneezing and running nose- [name], NAII          Current Facility-Administered Medications   Medication Dose Route Frequency Provider Last Rate Last Admin   ??? albumin human 25 % bottle 25 g  25 g Intravenous TID Alfonse Ras, MD   25 g at 01/08/20 2008   ??? albuterol 2.5 mg /3 mL (0.083 %) nebulizer solution 2.5 mg  2.5 mg Nebulization Q6H PRN Melinda Crutch, MD       ??? carboxymethylcellulose sodium (REFRESH CELLUVISC) 1 % ophthalmic gel 1 drop  1 drop Both Eyes 4x Daily PRN Lanier Prude, MD       ??? cefepime (MAXIPIME) 2 g in sodium chloride 0.9 % (NS) 100 mL IVPB-connector bag  2 g Intravenous Q12H Lanier Prude, MD   Stopped at 01/09/20 [phone]   ??? heparin 1,000 units/500 mL (2 units/mL) in 0.9% NaCl infusion  3 mL/hr Intravenous Continuous Melinda Crutch, MD 3 mL/hr at 01/09/20 0700 3 mL/hr at 01/09/20 0700   ??? hydrocortisone sod succ (Solu-CORTEF) injection 50 mg  50 mg Intravenous Q6H Melinda Crutch, MD   50 mg at 01/09/20 0208   ??? HYDROmorphone (PF) (DILAUDID) injection 0.5 mg  0.5 mg Intravenous Q3H PRN Alfonse Ras, MD   0.5 mg at 01/09/20 0255   ??? lactulose (CHRONULAC) oral solution  10 g Enteral tube: gastric  BID Lanier Prude, MD   10 g at 01/08/20 2008   ??? LORazepam (ATIVAN) injection 1 mg  1 mg Intravenous Once PRN Lanier Prude, MD       ??? metroNIDAZOLE (FLAGYL) 5 mg/mL in sodium chloride 0.9% injection 250 mg  250 mg Intravenous Q8H [place] E Reasor, MD   Stopped at 01/09/20 0309   ??? norepinephrine 8 mg in sodium chloride 0.9 % 250 mL (0.032 mg/mL) infusion  0-30 mcg/min Intravenous Continuous [place] E Reasor, MD 15 mL/hr at 01/09/20 0700 8 mcg/min at 01/09/20 0700   ??? pantoprazole dilution (PROTONIX) 0.8 mg/mL injection 40 mg  40 mg Intravenous Daily Melinda Crutch, MD   Stopped at 01/08/20 [phone]   ??? phytonadione (vitamin K1) (AQUA-MEPHYTON) 10 mg in sodium chloride (NS) 0.9 % 50 mL IVPB  10 mg Intravenous Daily Alfonse Ras, MD   Stopped at 01/08/20 1242   ??? sodium chloride 77 mEq/L, sodium acetate 77 mEq/L in dextrose 5 %, sterile water 1,000 mL infusion  100 mL/hr Intravenous Continuous [place] E Reasor, MD 100 mL/hr at 01/09/20 0700 Rate Verify at 01/09/20 0700   ??? tacrolimus (PROGRAF) oral suspension  0.5 mg Enteral tube: gastric  BID Early Osmond, MD   0.5 mg at 01/08/20 1937   ???  white petrolatum-min oil eye ointment  1 application Both Eyes TID PRN [place] E Reasor, MD   1 application at 01/08/20 2349       Past Medical History:   Diagnosis Date   ??? 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 Depression    ??? MSSA bacteremia 08/27/2019   ??? Seasonal allergies    ??? Sickle cell trait (CMS-HCC)    ??? Strep Mitis Central line-associated bloodstream infection 08/27/2019       Past Surgical History:   Procedure Laterality Date   ??? FRENULECTOMY, LINGUAL     ??? PR ERCP REMOVE FOREIGN BODY/STENT BILIARY/PANC DUCT N/A 08/20/2017    Procedure: ENDOSCOPIC RETROGRADE CHOLANGIOPANCREATOGRAPHY (ERCP); W/ REMOVAL OF FOREIGN BODY/STENT FROM BILIARY/PANCREATIC DUCT(S);  Surgeon: Vonda Antigua, MD;  Location: GI PROCEDURES MEMORIAL [facility];  Service: Gastroenterology   ??? PR ERCP REMOVE FOREIGN BODY/STENT BILIARY/PANC DUCT N/A 06/04/2018    Procedure: ENDOSCOPIC RETROGRADE CHOLANGIOPANCREATOGRAPHY (ERCP); W/ REMOVAL OF FOREIGN BODY/STENT FROM BILIARY/PANCREATIC DUCT(S);  Surgeon: Chriss Driver, MD;  Location: GI PROCEDURES MEMORIAL [facility];  Service: Gastroenterology   ??? PR ERCP STENT PLACEMENT BILIARY/PANCREATIC DUCT N/A 11/21/2016    Procedure: ENDOSCOPIC RETROGRADE CHOLANGIOPANCREATOGRAPHY (ERCP); WITH PLACEMENT OF ENDOSCOPIC STENT INTO BILIARY OR PANCREATIC DUCT;  Surgeon: Vonda Antigua, MD;  Location: GI PROCEDURES MEMORIAL [facility];  Service: Gastroenterology   ??? PR ERCP,W/REMOVAL STONE,BIL/PANCR DUCTS N/A 08/20/2017    Procedure: ERCP; W/ENDOSCOPIC RETROGRADE REMOVAL OF CALCULUS/CALCULI FROM BILIARY &/OR PANCREATIC DUCTS;  Surgeon: Vonda Antigua, MD;  Location: GI PROCEDURES MEMORIAL [facility];  Service: Gastroenterology   ??? PR ERCP,W/REMOVAL STONE,BIL/PANCR DUCTS N/A 06/04/2018    Procedure: ERCP; W/ENDOSCOPIC RETROGRADE REMOVAL OF CALCULUS/CALCULI FROM BILIARY &/OR PANCREATIC DUCTS;  Surgeon: Chriss Driver, MD;  Location: GI PROCEDURES MEMORIAL [facility];  Service: Gastroenterology   ??? PR TRANSPLANT LIVER,ALLOTRANSPLANT N/A 09/22/2016    Procedure: LIVER ALLOTRANSPLANTATION; ORTHOTOPIC, PARTIAL OR WHOLE, FROM CADAVER OR LIVING DONOR, ANY AGE;  Surgeon: Doyce Loose, MD;  Location: MAIN OR [facility];  Service: Transplant   ??? PR TRANSPLANT,PREP DONOR LIVER, WHOLE N/A 09/22/2016    Procedure: [facility] STD PREP CAD DONOR WHOLE LIVER GFT PRIOR TNSPLNT,INC CHOLE,DISS/REM SURR TISSU WO TRISEG/LOBE SPLT;  Surgeon: Doyce Loose, MD;  Location: MAIN OR [facility];  Service: Transplant   ??? PR UPGI ENDOSCOPY W/US FN BX N/A 08/20/2017    Procedure: UGI W/ TRANSENDOSCOPIC ULTRASOUND GUIDED INTRAMURAL/TRANSMURAL FINE NEEDLE ASPIRATION/BIOPSY(S), ESOPHAGUS;  Surgeon: Vonda Antigua, MD;  Location: GI PROCEDURES MEMORIAL [facility];  Service: Gastroenterology   ??? PR UPPER GI ENDOSCOPY,BIOPSY N/A 07/15/2016    Procedure: UGI ENDOSCOPY; WITH BIOPSY, SINGLE OR MULTIPLE;  Surgeon: Arnold Long Mir, MD;  Location: PEDS PROCEDURE ROOM [facility];  Service: Gastroenterology   ??? PR UPPER GI ENDOSCOPY,CTRL BLEED Left 05/05/2016    Procedure: UGI ENDOSCOPY; WITH CONTROL OF BLEEDING, ANY METHOD;  Surgeon: Arnold Long Mir, MD;  Location: PEDS PROCEDURE ROOM [facility];  Service: Gastroenterology   ??? PR UPPER GI ENDOSCOPY,CTRL BLEED N/A 05/12/2016    Procedure: UGI ENDOSCOPY; WITH CONTROL OF BLEEDING, ANY METHOD;  Surgeon: Annie Paras, MD;  Location: CHILDRENS OR [facility];  Service: Gastroenterology   ??? PR UPPER GI ENDOSCOPY,DIAGNOSIS N/A 09/21/2019    Procedure: UGI ENDO, INCLUDE ESOPHAGUS, STOMACH, & DUODENUM &/OR JEJUNUM; DX W/WO COLLECTION SPECIMN, BY BRUSH OR WASH;  Surgeon: Ozella Almond  Long, MD;  Location: GI PROCEDURES MEMORIAL [facility];  Service: Gastroenterology   ??? PR UPPER GI ENDOSCOPY,DIAGNOSIS N/A 12/13/2019    Procedure: UGI ENDO, INCLUDE ESOPHAGUS, STOMACH, & DUODENUM &/OR JEJUNUM; DX W/WO COLLECTION SPECIMN, BY BRUSH OR WASH;  Surgeon: Neysa Hotter, MD;  Location: GI PROCEDURES MEMORIAL [facility];  Service: Gastroenterology   ??? PR UPPER GI ENDOSCOPY,TUBE PLACE N/A 12/13/2019    Procedure: UGI ENDO; W/TRANSENDOSCOPIC TUBE/CATH PLCMT;  Surgeon: Neysa Hotter, MD;  Location: GI PROCEDURES MEMORIAL [facility];  Service: Gastroenterology       Social History     Socioeconomic History   ??? Marital status: Single     Spouse name: Not on file   ??? Number of children: Not on file   ??? Years of education: Not on file   ??? Highest education level: Not on file   Occupational History   ??? Not on file   Social Needs   ??? Financial resource strain: Not on file   ??? Food insecurity     Worry: Sometimes true     Inability: Sometimes true   ??? Transportation needs     Medical: Not on file     Non-medical: Not on file   Tobacco Use   ??? Smoking status: Never Smoker   ??? Smokeless tobacco: Never Used   ??? Tobacco comment: Step-father smokes outside   Substance and Sexual Activity   ??? Alcohol use: Never     Alcohol/week: 0.0 standard drinks     Frequency: Never     Comment: 1/2 glass of wine on rare occasions   ??? Drug use: Never   ??? Sexual activity: Never   Lifestyle   ??? Physical activity     Days per week: Not on file     Minutes per session: Not on file   ??? Stress: Not on file   Relationships   ??? Social Wellsite geologist on phone: Not on file     Gets together: Not on file     Attends religious service: Not on file     Active member of club or organization: Not on file     Attends meetings of clubs or organizations: Not on file     Relationship status: Not on file   Other Topics Concern   ??? Do you use sunscreen? Not Asked   ??? Tanning bed use? No   ??? Are you easily burned? No   ??? Excessive sun exposure? No   ??? Blistering sunburns? No   Social History Narrative    Lives part time with mother and part time with [employer].        Family History   Problem Relation Age of Onset   ??? Diabetes Maternal Grandmother    ??? Diabetes Paternal Grandmother    ??? Hypertension Maternal Grandfather    ??? Hypertension Paternal Grandfather    ??? Asthma Brother    ??? Anesthesia problems Neg Hx    ??? Melanoma Neg Hx    ??? Basal cell carcinoma Neg Hx    ??? Squamous cell carcinoma Neg Hx        Code Status: Full Code     Review of Systems     Unable to obtain due to patient factors.       Objective        Temp:  [36.4 ??C-37 ??C] 36.6 ??C  Heart Rate:  [96-121] 106  SpO2 Pulse:  [96-117] 107  Resp:  [10-22]  21  BP: (87-118)/(43-81) 99/46  MAP (mmHg):  [55-95] 62  A BP-1: (97-142)/(41-70) 122/57  MAP:  [63 mmHg-87 mmHg] 76 mmHg  FiO2 (%):  [30 %-100 %] 30 %  SpO2:  [100 %] 100 %  No intake/output data recorded.    Physical Exam: No Sedation  General Appearance:Chronically ill appearing.  Not sedated, unresponsive.   HEENT: Head is atraumatic and normocephalic.  Scleral icterus present bilaterally.  Neck: Supple. Grossly normal range of motion.  Lungs: Normal work of breathing on nasal cannula, symmetric chest rise.  Heart: Warm and well-perfused, normal rate.  Abdomen: Nondistended.  Extremities: No clubbing, cyanosis, or edema. Diffusely jaundiced..    Neurological Examination:     Mental Status: Patient does not arouse to verbal or noxious stimuli. Unable to assess speech due to current level of arousal. Does not follow commands to open eyes raise their thumb.  Make some moaning noises and grimaces to noxious stimulus.  Does not open eyes or regard examiner.    Cranial Nerves: PERRL 4 mm. Oculocephalic reflex is present and intact. Corneal reflex intact bilaterally. Face symmetric at rest and with spontaneous activation.    Motor Exam: Globally decreased bulk.  Increased tone in the BUE/BLE.  RUE: withdraws to noxious stimulus.  LUE: withdraws to noxious stimulus.  RLE: withdraws to noxious stimulus.  LLE: withdraws to noxious stimulus.      Reflexes:   R L   Biceps +3 +3   Brachioradialis +3 +3   Triceps +3 +3   Patella +3 +3   Achilles +4 +4   Toes are down on left, mute on right, sustained clonus bilaterally.    Sensory: Response to noxious stimulus as above.    Cerebellar/Coordination/Gait: Unable to assess due to mental status.            Diagnostic Studies      All Labs Last 24hrs:   Recent Results (from the past 24 hour(s))   Blood Gas Critical Care Panel, Venous    Collection Time: 01/08/20  8:08 AM   Result Value Ref Range    Specimen Source Venous     FIO2 Venous Not Specified     pH, Venous 7.28 (L) 7.32 - 7.43    pCO2, Ven 29 (L) 40 - 60 mm Hg    pO2, Ven 38 30 - 55 mm Hg    HCO3, Ven 13 (L) 22 - 27 mmol/L    Base Excess, Ven -12.3 (L) -2.0 - 2.0    O2 Saturation, Venous 65.2 40.0 - 85.0 %    Sodium Whole Blood 134 (L) 135 - 145 mmol/L    Potassium, Bld 4.0 3.4 - 4.6 mmol/L    Calcium, Ionized Venous 4.77 4.40 - 5.40 mg/dL    Glucose Whole Blood 279 (H) 70 - 179 mg/dL    Lactate, Venous 2.8 (H) 0.5 - 1.8 mmol/L    Hgb, blood gas 8.20 (L) 13.50 - 17.50 g/dL   Basic Metabolic Panel    Collection Time: 01/08/20  8:08 AM   Result Value Ref Range    Sodium 133 (L) 135 - 145 mmol/L    Potassium 4.0 3.5 - 5.0 mmol/L    Chloride 110 (H) 98 - 107 mmol/L    CO2      Anion Gap      BUN 38 (H) 7 - 21 mg/dL    Creatinine 8.46 (H) 0.70 - 1.30 mg/dL    BUN/Creatinine Ratio 21     EGFR  CKD-EPI Non-African American, Male 52 (L) >=60 mL/min/1.39m2    EGFR CKD-EPI African American, Male 78 >=60 mL/min/1.24m2    Glucose 247 (H) 70 - 99 mg/dL    Calcium 8.0 (L) 8.5 - 10.2 mg/dL   Magnesium Level    Collection Time: 01/08/20  8:08 AM   Result Value Ref Range    Magnesium 2.0 1.6 - 2.2 mg/dL   Phosphorus Level    Collection Time: 01/08/20  8:08 AM   Result Value Ref Range    Phosphorus 4.2 2.9 - 4.7 mg/dL   Hepatic Function Panel    Collection Time: 01/08/20  8:08 AM   Result Value Ref Range    Albumin 2.3 (L) 3.5 - 5.0 g/dL    Total Protein      Total Bilirubin 38.4 (H) 0.0 - 1.2 mg/dL    Bilirubin, Direct 16.10 (H) 0.00 - 0.40 mg/dL    AST 960 (H) 19 - 55 U/L    ALT      Alkaline Phosphatase 1,362 (H) 65 - 260 U/L   Vancomycin, Random    Collection Time: 01/08/20  8:08 AM   Result Value Ref Range    Vancomycin Rm 13.8 Undefined ug/mL   Ammonia    Collection Time: 01/08/20  8:08 AM   Result Value Ref Range    Ammonia 13 9 - 33 umol/L   Basic Metabolic Panel    Collection Time: 01/08/20  4:06 PM   Result Value Ref Range    Sodium 137 135 - 145 mmol/L    Potassium 3.5 3.5 - 5.0 mmol/L    Chloride 111 (H) 98 - 107 mmol/L    CO2      Anion Gap      BUN 39 (H) 7 - 21 mg/dL    Creatinine 4.54 (H) 0.70 - 1.30 mg/dL    BUN/Creatinine Ratio 19     EGFR CKD-EPI Non-African American, Male 46 (L) >=60 mL/min/1.36m2    EGFR CKD-EPI African American, Male 60 (L) >=60 mL/min/1.44m2    Glucose 208 (H) 70 - 99 mg/dL    Calcium 7.8 (L) 8.5 - 10.2 mg/dL   Magnesium Level    Collection Time: 01/08/20  4:06 PM   Result Value Ref Range    Magnesium 2.0 1.6 - 2.2 mg/dL   Phosphorus Level    Collection Time: 01/08/20  4:06 PM   Result Value Ref Range    Phosphorus 5.1 (H) 2.9 - 4.7 mg/dL   Blood Gas Critical Care Panel, Arterial    Collection Time: 01/08/20  4:06 PM   Result Value Ref Range    Specimen Source Arterial     FIO2 Arterial Not Specified     pH, Arterial 7.36 7.35 - 7.45    pCO2, Arterial 20.0 (L) 35.0 - 45.0 mm Hg    pO2, Arterial 148.0 (H) 80.0 - 110.0 mm Hg    HCO3 (Bicarbonate), Arterial 11 (L) 22 - 27 mmol/L    Base Excess, Arterial -13.5 (L) -2.0 - 2.0    O2 Sat, Arterial 99.7 94.0 - 100.0 %    Sodium Whole Blood 135 135 - 145 mmol/L    Potassium, Bld 3.4 3.4 - 4.6 mmol/L    Calcium, Ionized Arterial 4.39 (L) 4.40 - 5.40 mg/dL    Glucose Whole Blood 226 (H) 70 - 179 mg/dL    Lactate, Arterial 3.6 (H) <1.3 mmol/L    Hgb, blood gas 6.60 (L) 13.50 - 17.50 g/dL   Hepatic Function Panel  Collection Time: 01/08/20  4:06 PM   Result Value Ref Range    Albumin 2.6 (L) 3.5 - 5.0 g/dL    Total Protein      Total Bilirubin 38.9 (H) 0.0 - 1.2 mg/dL    Bilirubin, Direct 16.10 (H) 0.00 - 0.40 mg/dL    AST 960 (H) 19 - 55 U/L    ALT      Alkaline Phosphatase 1,148 (H) 65 - 260 U/L   CBC w/ Differential    Collection Time: 01/08/20  4:06 PM   Result Value Ref Range    Results Verified by Slide Scan Slide Reviewed     WBC 22.4 (H) 4.5 - 11.0 10*9/L    RBC 2.69 (L) 4.50 - 5.90 10*12/L    HGB 7.3 (L) 13.5 - 17.5 g/dL    HCT 45.4 (L) 09.8 - 53.0 %    MCV 85.2 80.0 - 100.0 fL    MCH 27.2 26.0 - 34.0 pg    MCHC 31.9 31.0 - 37.0 g/dL    RDW 11.9 (H) 14.7 - 15.0 %    MPV 6.1 (L) 7.0 - 10.0 fL    Platelet 165 150 - 440 10*9/L    Variable HGB Concentration Moderate (A) Not Present    Neutrophils % 91.0 %    Lymphocytes % 2.2 %    Monocytes % 5.5 %    Eosinophils % 0.1 %    Basophils % 0.7 %    Neutrophil Left Shift 3+ (A) Not Present    Absolute Neutrophils 20.4 (H) 2.0 - 7.5 10*9/L    Absolute Lymphocytes 0.5 (L) 1.5 - 5.0 10*9/L    Absolute Monocytes 1.2 (H) 0.2 - 0.8 10*9/L    Absolute Eosinophils 0.0 0.0 - 0.4 10*9/L    Absolute Basophils 0.2 (H) 0.0 - 0.1 10*9/L    Large Unstained Cells 1 0 - 4 %    Microcytosis Marked (A) Not Present    Macrocytosis Slight (A) Not Present    Anisocytosis Marked (A) Not Present    Hyperchromasia Moderate (A) Not Present   aPTT    Collection Time: 01/08/20  4:06 PM   Result Value Ref Range    APTT 52.8 (H) 25.3 - 37.1 sec    Heparin Correlation 0.3    PT-INR    Collection Time: 01/08/20  4:06 PM   Result Value Ref Range    PT 33.9 (H) 10.5 - 13.5 sec    INR 3.00 Fibrinogen    Collection Time: 01/08/20  4:06 PM   Result Value Ref Range    Fibrinogen 226 177 - 386 mg/dL   D-Dimer, Quantitative    Collection Time: 01/08/20  4:06 PM   Result Value Ref Range    D-Dimer 366 (H) <230 ng/mL DDU   Morphology Review    Collection Time: 01/08/20  4:06 PM   Result Value Ref Range    Smear Review Comments See Comment (A) Undefined    Polychromasia Slight (A) Not Present    Target Cells Marked (A) Not Present    Schistocytes Moderate (AA) Not Present    Burr Cells Present (A) Not Present    Poikilocytosis Marked (A) Not Present   Blood Gas Critical Care Panel, Arterial    Collection Time: 01/08/20 10:50 PM   Result Value Ref Range    Specimen Source Arterial     FIO2 Arterial Not Specified     pH, Arterial 7.28 (L) 7.35 - 7.45    pCO2,  Arterial 28.5 (L) 35.0 - 45.0 mm Hg    pO2, Arterial 119.0 (H) 80.0 - 110.0 mm Hg    HCO3 (Bicarbonate), Arterial 13 (L) 22 - 27 mmol/L    Base Excess, Arterial -12.3 (L) -2.0 - 2.0    O2 Sat, Arterial 98.9 94.0 - 100.0 %    Sodium Whole Blood 141 135 - 145 mmol/L    Potassium, Bld 3.3 (L) 3.4 - 4.6 mmol/L    Calcium, Ionized Arterial 4.21 (L) 4.40 - 5.40 mg/dL    Glucose Whole Blood 211 (H) 70 - 179 mg/dL    Lactate, Arterial 2.8 (H) <1.3 mmol/L    Hgb, blood gas 5.40 (L) 13.50 - 17.50 g/dL   Basic Metabolic Panel    Collection Time: 01/08/20 11:52 PM   Result Value Ref Range    Sodium 139 135 - 145 mmol/L    Potassium 3.4 (L) 3.5 - 5.0 mmol/L    Chloride 113 (H) 98 - 107 mmol/L    CO2      Anion Gap      BUN 41 (H) 7 - 21 mg/dL    Creatinine 1.61 (H) 0.70 - 1.30 mg/dL    BUN/Creatinine Ratio 20     EGFR CKD-EPI Non-African American, Male 45 (L) >=60 mL/min/1.79m2    EGFR CKD-EPI African American, Male 79 (L) >=60 mL/min/1.75m2    Glucose 201 (H) 70 - 99 mg/dL    Calcium 7.7 (L) 8.5 - 10.2 mg/dL   Magnesium Level    Collection Time: 01/08/20 11:52 PM   Result Value Ref Range    Magnesium 2.0 1.6 - 2.2 mg/dL   Phosphorus Level    Collection Time: 01/08/20 11:52 PM   Result Value Ref Range    Phosphorus 5.2 (H) 2.9 - 4.7 mg/dL   Vancomycin, Random    Collection Time: 01/08/20 11:52 PM   Result Value Ref Range    Vancomycin Rm 24.8 Undefined ug/mL   Blood Gas Critical Care Panel, Arterial    Collection Time: 01/09/20  3:17 AM   Result Value Ref Range    Specimen Source Arterial     FIO2 Arterial Not Specified     pH, Arterial 7.41 7.35 - 7.45    pCO2, Arterial 24.0 (L) 35.0 - 45.0 mm Hg    pO2, Arterial 175.0 (H) 80.0 - 110.0 mm Hg    HCO3 (Bicarbonate), Arterial 15 (L) 22 - 27 mmol/L    Base Excess, Arterial -9.1 (L) -2.0 - 2.0    O2 Sat, Arterial      Sodium Whole Blood 139 135 - 145 mmol/L    Potassium, Bld 2.9 (L) 3.4 - 4.6 mmol/L    Calcium, Ionized Arterial 4.48 4.40 - 5.40 mg/dL    Glucose Whole Blood 235 (H) 70 - 179 mg/dL    Lactate, Arterial 1.8 (H) <1.3 mmol/L    Hgb, blood gas     Hepatic Function Panel    Collection Time: 01/09/20  3:17 AM   Result Value Ref Range    Albumin 2.5 (L) 3.5 - 5.0 g/dL    Total Protein      Total Bilirubin 41.0 (H) 0.0 - 1.2 mg/dL    Bilirubin, Direct 09.60 (H) 0.00 - 0.40 mg/dL    AST 454 (H) 19 - 55 U/L    ALT      Alkaline Phosphatase 889 (H) 65 - 260 U/L   CBC w/ Differential    Collection Time: 01/09/20  3:17 AM  Result Value Ref Range    Results Verified by Slide Scan Slide Reviewed     WBC 13.9 (H) 4.5 - 11.0 10*9/L    RBC 2.17 (L) 4.50 - 5.90 10*12/L    HGB 6.0 (L) 13.5 - 17.5 g/dL    HCT 54.0 (L) 98.1 - 53.0 %    MCV 85.1 80.0 - 100.0 fL    MCH 27.9 26.0 - 34.0 pg    MCHC 32.7 31.0 - 37.0 g/dL    RDW 19.1 (H) 47.8 - 15.0 %    MPV 6.6 (L) 7.0 - 10.0 fL    Platelet 136 (L) 150 - 440 10*9/L    nRBC 1 <=4 /100 WBCs    Variable HGB Concentration Moderate (A) Not Present    Neutrophils % 87.4 %    Lymphocytes % 2.7 %    Monocytes % 8.6 %    Eosinophils % 0.1 %    Basophils % 0.4 %    Neutrophil Left Shift 3+ (A) Not Present    Absolute Neutrophils 12.2 (H) 2.0 - 7.5 10*9/L    Absolute Lymphocytes 0.4 (L) 1.5 - 5.0 10*9/L Absolute Monocytes 1.2 (H) 0.2 - 0.8 10*9/L    Absolute Eosinophils 0.0 0.0 - 0.4 10*9/L    Absolute Basophils 0.1 0.0 - 0.1 10*9/L    Large Unstained Cells 1 0 - 4 %    Microcytosis Marked (A) Not Present    Macrocytosis Slight (A) Not Present    Anisocytosis Marked (A) Not Present    Hyperchromasia Slight (A) Not Present   aPTT    Collection Time: 01/09/20  3:17 AM   Result Value Ref Range    APTT 56.3 (H) 25.3 - 37.1 sec    Heparin Correlation 0.3    PT-INR    Collection Time: 01/09/20  3:17 AM   Result Value Ref Range    PT 33.6 (H) 10.5 - 13.5 sec    INR 2.97    Fibrinogen    Collection Time: 01/09/20  3:17 AM   Result Value Ref Range    Fibrinogen 181 177 - 386 mg/dL   D-Dimer, Quantitative    Collection Time: 01/09/20  3:17 AM   Result Value Ref Range    D-Dimer 393 (H) <230 ng/mL DDU   Morphology Review    Collection Time: 01/09/20  3:17 AM   Result Value Ref Range    Smear Review Comments See Comment (A) Undefined    Polychromasia Slight (A) Not Present    Schistocytes Moderate (AA) Not Present    Spherocytes Present (A) Not Present    Burr Cells Present (A) Not Present    Acanthocytes Present (A) Not Present    Pappenheimer Bodies Present (A) Not Present    Poikilocytosis Marked (A) Not Present   Type and Screen    Collection Time: 01/09/20  4:17 AM   Result Value Ref Range    ABO Grouping B POS     Antibody Screen NEG    ABO/RH    Collection Time: 01/09/20  4:20 AM   Result Value Ref Range    Reject/Recollect REJECT    Prepare Plasma    Collection Time: 01/09/20  5:08 AM   Result Value Ref Range    Unit Blood Type AB Pos     ISBT Number 8400     Unit # [id number]     Status Issued     Product ID FFP     PRODUCT CODE [id number]  Prepare RBC    Collection Time: 01/09/20  6:23 AM   Result Value Ref Range    Crossmatch Compatible     Unit Blood Type B Pos     ISBT Number 7300     Unit # [id number]     Status Issued     Spec Expiration [id number]     Product ID Red Blood Cells     PRODUCT CODE [id number]    Blood Gas Critical Care Panel, Arterial    Collection Time: 01/09/20  6:44 AM   Result Value Ref Range    Specimen Source Arterial     FIO2 Arterial Not Specified     pH, Arterial 7.40 7.35 - 7.45    pCO2, Arterial 27.7 (L) 35.0 - 45.0 mm Hg    pO2, Arterial 177.0 (H) 80.0 - 110.0 mm Hg    HCO3 (Bicarbonate), Arterial 17 (L) 22 - 27 mmol/L    Base Excess, Arterial -7.3 (L) -2.0 - 2.0    O2 Sat, Arterial 99.8 94.0 - 100.0 %    Sodium Whole Blood 142 135 - 145 mmol/L    Potassium, Bld 2.7 (L) 3.4 - 4.6 mmol/L    Calcium, Ionized Arterial 4.23 (L) 4.40 - 5.40 mg/dL    Glucose Whole Blood 248 (H) 70 - 179 mg/dL    Lactate, Arterial 1.5 (H) <1.3 mmol/L    Hgb, blood gas 4.90 (LL) 13.50 - 17.50 g/dL     Echocardiogram 06/14/15:  Summary:   1. No intracardiac vegetations visualized.   2. A pleural effusion is visualized on the right side. Unable to adequately quantitate it. If clinical concern persists, advise evaluation by other modalities.   3. There is a trivial inferior and posterior pericardial effusion that is not hemodynamically significant. It cannot be safely accessed by pericardiocentesis.   4. Normal left ventricular cavity size and systolic function.   5. Normal right ventricular cavity size and systolic function.    CT Head, 01/08/20, personally reviewed:  Scattered well-defined hypodensities throughout the gray matter and sulci of the bilateral cerebral and cerebellar hemispheres, new compared to prior. Findings are concerning for embolic phenomenon. Recommend MRI brain to further characterize.

## 2020-01-09 NOTE — Unmapped (Signed)
OCCUPATIONAL THERAPY  Evaluation (01/09/20 0945)    Patient Name:  [name] Gross       Medical Record Number: [id number]   Date of Birth: 15-Apr-2001  Sex: Male          OT Treatment Diagnosis:  h/o liver transplant in 2017 admitted with AMS and likely liver shock        Assessment  Assessment: Jeremian presents with severe AMS and likely liver shock in the setting of liver transplant and rejection. Mom is at bedside and reports [name] Gross was at his basline functioning (ind with ADL's, walks around house) until Saturday when he became altered. On evaluation he was non-responsive to stimulation and did not arouse. He currently demonstrates full PROM and was positioned well in bed. Reviewed OT services with Mom who verbalized understanding. Will continue to follow pt for appropriateness of intervention.  Today's Interventions: Education - Family / caregiver, Positioning, Range of motion    Activity Tolerance During Today's Session  Patient limited by Mental Status, Treatment limited secondary to medical complications    Plan  Planned Frequency of Treatment:  1x per day for: 1-2x week  Planned Treatment Duration: 2 weeks      Post-Discharge Occupational Therapy Recommendations:  OT Post Acute Discharge Recommendations: OT services not indicated        GOALS:   Patient and Family Goals: return to PLOF    Long Term Goal #1: Pt will return to PLOF       Short Term:  Pt will tolerate 10 min of PROM with VSS   Time Frame : 2 weeks  Caregivers will demonstrate understanding of PROM and positioning   Time Frame : 2 weeks                   Subjective  Current Status supine in bed, obtunded  Prior Functional Status ind with ADL's and mobility; has hospice nursing at home       Services patient receives: PT    Patient / Caregiver reports: Mom reports he has been independent until Saturday    Past Medical History:   Diagnosis Date   ??? 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 Depression    ??? MSSA bacteremia 08/27/2019   ??? Seasonal allergies    ??? Sickle cell trait (CMS-HCC)    ??? Strep Mitis Central line-associated bloodstream infection 08/27/2019    Social History     Tobacco Use   ??? Smoking status: Never Smoker   ??? Smokeless tobacco: Never Used   ??? Tobacco comment: Step-father smokes outside   Substance Use Topics   ??? Alcohol use: Never     Alcohol/week: 0.0 standard drinks     Frequency: Never     Comment: 1/2 glass of wine on rare occasions      Past Surgical History:   Procedure Laterality Date   ??? FRENULECTOMY, LINGUAL     ??? PR ERCP REMOVE FOREIGN BODY/STENT BILIARY/PANC DUCT N/A 08/20/2017    Procedure: ENDOSCOPIC RETROGRADE CHOLANGIOPANCREATOGRAPHY (ERCP); W/ REMOVAL OF FOREIGN BODY/STENT FROM BILIARY/PANCREATIC DUCT(S);  Surgeon: Vonda Antigua, MD;  Location: [facility]URES [facility];  Service: Gastroenterology   ??? PR ERCP REMOVE FOREIGN BODY/STENT BILIARY/PANC DUCT N/A 06/04/2018    Procedure: ENDOSCOPIC RETROGRADE CHOLANGIOPANCREATOGRAPHY (ERCP); W/ REMOVAL OF FOREIGN BODY/STENT FROM BILIARY/PANCREATIC DUCT(S);  Surgeon:  Chriss Driver, MD;  Location: GI PROCEDURES [facility];  Service: Gastroenterology   ??? PR ERCP STENT PLACEMENT BILIARY/PANCREATIC DUCT N/A 11/21/2016    Procedure: ENDOSCOPIC RETROGRADE CHOLANGIOPANCREATOGRAPHY (ERCP); WITH PLACEMENT OF ENDOSCOPIC STENT INTO BILIARY OR PANCREATIC DUCT;  Surgeon: Vonda Antigua, MD;  Location: GI PROCEDURES [facility];  Service: Gastroenterology   ??? PR ERCP,W/REMOVAL STONE,BIL/PANCR DUCTS N/A 08/20/2017    Procedure: ERCP; W/ENDOSCOPIC RETROGRADE REMOVAL OF CALCULUS/CALCULI FROM BILIARY &/OR PANCREATIC DUCTS;  Surgeon: Vonda Antigua, MD;  Location: GI [facility]URES [facility];  Service: Gastroenterology   ??? PR ERCP,W/REMOVAL STONE,BIL/PANCR DUCTS N/A 06/04/2018    Procedure: ERCP; W/ENDOSCOPIC RETROGRADE REMOVAL OF CALCULUS/CALCULI FROM BILIARY &/OR PANCREATIC DUCTS;  Surgeon: Chriss Driver, MD;  Location: [facility];  Service: Gastroenterology   ??? PR TRANSPLANT LIVER,ALLOTRANSPLANT N/A 09/22/2016    Procedure: LIVER ALLOTRANSPLANTATION; ORTHOTOPIC, PARTIAL OR WHOLE, FROM CADAVER OR LIVING DONOR, ANY AGE;  Surgeon: Doyce Loose, MD;  Location: MAIN OR [facility];  Service: Transplant   ??? PR TRANSPLANT,PREP DONOR LIVER, WHOLE N/A 09/22/2016    Procedure: [facility] STD PREP CAD DONOR WHOLE LIVER GFT PRIOR TNSPLNT,INC CHOLE,DISS/REM SURR TISSU WO TRISEG/LOBE SPLT;  Surgeon: Doyce Loose, MD;  Location: MAIN OR [facility];  Service: Transplant   ??? PR UPGI ENDOSCOPY W/US FN BX N/A 08/20/2017    Procedure: UGI W/ TRANSENDOSCOPIC ULTRASOUND GUIDED INTRAMURAL/TRANSMURAL FINE NEEDLE ASPIRATION/BIOPSY(S), ESOPHAGUS;  Surgeon: Vonda Antigua, MD;  Location: GI PROCEDURES [facility];  Service: Gastroenterology   ??? PR UPPER GI ENDOSCOPY,BIOPSY N/A 07/15/2016    Procedure: UGI ENDOSCOPY; WITH BIOPSY, SINGLE OR MULTIPLE;  Surgeon: Arnold Long Mir, MD;  Location: PEDS PROCEDURE ROOM [facility];  Service: Gastroenterology   ??? PR UPPER GI ENDOSCOPY,CTRL BLEED Left 05/05/2016    Procedure: UGI ENDOSCOPY; WITH CONTROL OF BLEEDING, ANY METHOD;  Surgeon: Arnold Long Mir, MD;  Location: PEDS PROCEDURE ROOM [facility];  Service: Gastroenterology   ??? PR UPPER GI ENDOSCOPY,CTRL BLEED N/A 05/12/2016    Procedure: UGI ENDOSCOPY; WITH CONTROL OF BLEEDING, ANY METHOD;  Surgeon: Annie Paras, MD;  Location: CHILDRENS OR [facility];  Service: Gastroenterology   ??? PR UPPER GI ENDOSCOPY,DIAGNOSIS N/A 09/21/2019    Procedure: UGI ENDO, INCLUDE ESOPHAGUS, STOMACH, & DUODENUM &/OR JEJUNUM; DX W/WO COLLECTION SPECIMN, BY BRUSH OR WASH;  Surgeon: Monte Fantasia, MD;  Location: GI PROCEDURES [facility];  Service: Gastroenterology   ??? PR UPPER GI ENDOSCOPY,DIAGNOSIS N/A 12/13/2019    Procedure: UGI ENDO, INCLUDE ESOPHAGUS, STOMACH, & DUODENUM &/OR JEJUNUM; DX W/WO COLLECTION SPECIMN, BY BRUSH OR WASH;  Surgeon: Neysa Hotter, MD;  Location: GI PROCEDURES [facility];  Service: Gastroenterology   ??? PR UPPER GI ENDOSCOPY,TUBE PLACE N/A 12/13/2019    Procedure: UGI ENDO; W/TRANSENDOSCOPIC TUBE/CATH PLCMT;  Surgeon: Neysa Hotter, MD;  Location: GI PROCEDURES [facility];  Service: Gastroenterology    Family History   Problem Relation Age of Onset   ??? Diabetes Maternal Grandmother    ??? Diabetes Paternal Grandmother    ??? Hypertension Maternal Grandfather    ??? Hypertension Paternal Grandfather    ??? Asthma Brother    ??? Anesthesia problems Neg Hx    ??? Melanoma Neg Hx    ??? Basal cell carcinoma Neg Hx    ??? Squamous cell carcinoma Neg Hx         Bee pollen and Pollen extracts     Objective Findings  Precautions / Restrictions  Non-applicable    Weight Bearing  Non-applicable  Required Braces or Orthoses  Non-applicable    Communication Preference  Verbal    Pain  0/10 FLACC    Equipment / Environment  Supplemental oxygen, NGT, Vascular access (PIV, TLC, Port-a-cath, PICC), Telemetry, Foley, Caregiver wearing mask for full session, Arterial line, Patient not wearing mask for full session    Living Situation  Living Environment: House  Lives With: Mother  Home Living: One level home     Cognition   Orientation Level:  Disoriented x 4   Arousal/Alertness:  Unresponsive to stimuli   Attention Span:  Unable to assess   Memory:  Unable to assess   Following Commands:  Not following commands     Vision / Perception        Vision: Wears glasses all the time  Perception: UTA       Hand Function  Hand Dominance: R  UTA    Skin Inspection  c/d/i as visualized    ROM / Strength/Coordination  UE ROM/ Strength/ Coordination: PROM WNL  LE ROM/ Strength/ Coordination: PROM WNL      Balance:  n/a           ADLs  ADLs: Total Assistance             Medical Staff Made Aware: RN Sam aware      Occupational Therapy Session Duration  OT Individual - Duration: 10         I attest that I have reviewed the above information.  Signed: York Ram, OT  Filed 01/09/2020

## 2020-01-09 NOTE — Unmapped (Signed)
Follow Up Hepatology Note:  [name] is a 19 year old male with h/o liver transplant in 2017. Has significant chronic rejection, and is being evaluated for re-transplantation, but frailty/poor nutrition are barriers. Admitted with increasing weakness, fatigue and AMS    Interval History:  [name] is unresponsive to verbal stimuli. I was not able to get him to respond to physical stimuli, but mother reports he did withdraw to pain earlier. On [name] Gross. BP improving with hydrocortisone, remains on norepi, but downtrending.    Vitals:    01/09/20 1000   BP: 106/66   Pulse: 102   Resp: 20   Temp:    SpO2: 100%       ??? albumin human  25 g Intravenous TID   ??? albumin human       ??? Cefepime  2 g Intravenous Q12H   ??? hydrocortisone sod succ  50 mg Intravenous Q6H   ??? lactulose  200 g Rectal Once   ??? lactulose  10 g Enteral tube: gastric  BID   ??? metronidazole  250 mg Intravenous Q8H   ??? pantoprazole (PROTONIX) intravenous solutio  40 mg Intravenous Daily   ??? phytonadione (AQUA-MEPHYTON) intravenous  10 mg Intravenous Daily   ??? sulfamethoxazole-trimethoprim  1 tablet Oral Once per day on Mon Wed Fri   ??? Tacrolimus  0.5 mg Enteral tube: gastric  BID   ??? valGANciclovir  450 mg Enteral tube: gastric  Daily     albuterol, carboxymethylcellulose sodium, HYDROmorphone, ocular lubricant    Recent Labs     01/08/20  0016 01/08/20  0017 01/08/20  0017 01/08/20  0808 01/08/20  1606 01/08/20  1606 01/08/20  2352 01/09/20  0317 01/09/20  0644   NA 130*  --    < > 133* - 134* 137 - 135   < > 139 139 142   K 3.8  --    < > 4.0 - 4.0 3.5 - 3.4   < > 3.4* 2.9* 2.7*   CL 110*  --   --  110* 111*  --  113*  --   --    BUN 37*  --   --  38* 39*  --  41*  --   --    CREATININE 1.72*  --   --  1.83* 2.05*  --  2.06*  --   --    GLU 142  --   --  247* 208*  --  201*  --   --    WBC 15.0*  --   --   --  22.4*  --   --  13.9*  --    HGB 7.8*  --   --   --  7.3*  --   --  6.0*  --    HCT 24.1*  --   --   --  22.9*  --   --  18.4*  --    PLT 81* --   --   --  165  --   --  136*  --    BILITOT 36.1*  --   --  38.4* 38.9*  --   --  41.0*  --    INR  --  2.34  --   --  3.00  --   --  2.97  --    AST 566*  --   --  693* 600*  --   --  434*  --    ALKPHOS 1,031*  --   --  1,362* 1,148*  --   --  889*  --     < > = values in this interval not displayed.   ]    I/O last 3 completed shifts:  In: 4587.8 [I.V.:3679.8; Blood:215; NG/GT:10; IV Piggyback:683]  Out: 3317 [Urine:3317]  I/O this shift:  In: 825.1 [I.V.:334.2; Blood:351.9; NG/GT:31; IV Piggyback:108]  Out: 300 [Urine:300]      PE:  Const: altered, unresponsive to physical/verbal stimuli  Resp: breathing even and unlabored  CV:S1S2, ST on monitor  Abdomen: distended, tense      Assessment and Plan:  [name] is a 19 year old male who presented to [facility] with AMS. Found to have elevated WBC count, elevated creatinine, worsening LFTs, and hypotensive. Transferred to [facility] for further work up/evaluation.    [name] BP improving with stress dose steroids and broad spectrum antibiotics. No clear source of infection identified--UA not c/f UTI, blood cultures from [facility], but would continue to treat empirically.    Unfortunately, read on CT scan of head c/f multiple scattered hypodensitieis that are new, and c/f embolic phenomenon. Appreciate neurology's assessment, but if they feel imaging is truly c/w multiple embolic strokes, transplant would not be an option for [name].     [name] trough from this morning was 2.2, and his goal is 4-6. However, it is likely he missed several doses at home, as he was altered. Would not change his tacrolimus dosing at this time.     Recs:  --appreciate neurology's recommendations  --continue tacrolimus 0.5 mg BID, goal trough 4-6  --daily LFTs  --GOC discussions with family appropriate at this point. Given head CT findings, patient is not a transplant candidate.      Thank you for this consult.  Please page [phone] with questions. Ace Gins, NP-C  Transplant, Hepatology

## 2020-01-09 NOTE — Unmapped (Signed)
MRI on hold until screen form is completed in epic. Please answer all the questions in the form. If patient isn't able to answer the questions, please have immediate family member to help answer the questions and list their name and their number in the form. Patients needs to be in a hospital gown and medication patches should be removed prior to coming to MRI. Please call MRI at [phone] if you have any questions. Thank you!

## 2020-01-09 NOTE — Unmapped (Addendum)
Pediatric Vancomycin Therapeutic Monitoring Pharmacy Note    [name] Gross is a 19 y.o. male continuing vancomycin. Date of therapy initiation: 01/07/20    Indication: Sepsis R/O    Prior Dosing Information: Current regimen vancomycin dosed by level. Last dose vancomycin 1000 mg (~19 mg/kg) IV x 1 dose on 2/28 @ 1214    Dosing Weight: 52.9 kg    Goals:  Therapeutic Drug Levels  Vancomycin random goal: <20 mg/L to redose    Additional Clinical Monitoring/Outcomes  Renal function, volume status (intake and output)    Results:  ?? Vancomycin random level: 20.2 mg/L, drawn appropriately (~20 hour level) on 3/1 @ 0802  ?? Vancomycin random level: 24.8 mg/L, drawn appropriately (~12 hour level) on 2/28 @ 2352    Wt Readings from Last 1 Encounters:   01/08/20 51.8 kg (114 lb 3.2 oz) (1 %, Z= -2.21)*     * Growth percentiles are based on CDC (Boys, 2-20 Years) data.     Creatinine   Date Value Ref Range Status   01/08/2020 2.06 (H) 0.70 - 1.30 mg/dL Final   16/08/9603 5.40 (H) 0.70 - 1.30 mg/dL Final   98/09/9146 8.29 (H) 0.70 - 1.30 mg/dL Final        UOP (24 hour average): 1.5 mL/kg/hr    Pharmacokinetic Considerations and Significant Drug Interactions:  ? Dosed per pediatric guideline and dosing by levels  ? Patient-specific kinetics from levels in past 24 hours: half-life = 27.6 hours, k = 0.025 hr-1.  ? Concurrent nephrotoxic meds: vasoactive agents, tacrolimus, valganciclovir (ppx), SMX/TMP (ppx)    Assessment/Plan:  A level was checked overnight resulting as supratherapeutic at 24.8 mg/mL. Therefore a repeat level was checked this morning. The resulting level was slightly supratherapeutic again. Will continue to dose by level with scheduling a dose for later today to allow the latest level to drift down further to ~17.5 mg/L.    Recommendation(s)  ? Administer vancomycin 1000 mg (19 mg/kg) IV once at 1400  ? Maintain adequate hydration. Patient is currently receiving fluids at 100 mL/hr     Follow-up  ? Next level ordered: 03/02 @ 0800  ? A pharmacist will continue to monitor and order levels as appropriate    Please page service pharmacist with questions/clarifications.    Lars Pinks, PharmD   Pediatric Clinical Pharmacist  [phone] (pager)  [phone] (pediatric pharmacy)

## 2020-01-09 NOTE — Unmapped (Signed)
Pediatric Palliative Care Consult Note:  ??  [name] Gross Gross is a 19 y.o. male seen for pediatric palliative care consultation at the request of Dr. Arletha Gross for goals of care, advance care planning, and family support.  ??  Primary Care Provider: [name], MD  ??  History provided by: Mother   ??  Assessment:  ??  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 He is encephalopathic and unable to communicate. Parents are hoping for him to recover but recognize that he may not and they may have to face difficult decisions .     ??  Summary of Discussion and Recommendations:   ??  1. SYMPTOMS:   - Unable to assess with direct questioning due to encephalopthy  - Mother has no concerns about discomfort at this time but dislikes his breathing pattern/noisy breathing as it is reminiscent of what she's seen at end of life with nursing home residents. Notes he has coughed with repositioning. She has not felt he has needed pain medication so far but is watching him in case he looks uncomfortable.  - Family feels comfortable advocating for symptom management if she sees signs of discomfort  ??  2. GOALS:   - [name] Gross Gross previously expressed goals of redo transplant but has been less certain about this as his condition declines. Most recently he was focused on getting home and trying to enjoy things he likes (food, time with people he cares about). Grandmother shares he did say he wanted to focus on being comfortable recently and she felt him not wanting to come into the hospital was telling, but father notes he is not sure that he means comfortable in the same way that medical teams think of being made comfortable.   - [name] Gross Gross has long term goals which include going back to school and becoming a Runner, broadcasting/film/video or nurse  - Mother shares that [name] Gross Gross had very poor QOL prior to this admission and it has been declining steadily. She is not sure what is acceptable to him but it has been hard to watch him change.   - Family learned today that he has had embolic strokes and is not able to be a transplant candidate. Family wants to remain hopeful that he can get back to where he was last week and come home until the liver disease takes him. They share their worries about his progression but hopes and prayers for a miracle.  ??  3. DECISIONS:   - Information/communication preferences: [name] Gross Gross has historically  liked to receive information directly. There have been concerns about him being able to retain some information due to encephalopathy, mood disorder, etc in the past. He is currently not able to communicate.   - NG tube for supplemental nutrition was recommended during last admission and [name] Gross declined due to pain and difficulty breathing with tube in place. He has a tube now for meds - family opted to place this.  - Transplant: [name] Gross Gross is aware that redo transplant is not currently an option. Family learned it  will likely not be a future option due to his embolic strokes today.  - Family would like to see the results from the MRI to help them understand how [name] Gross Gross has been affected by the strokes and want to meet to discuss next steps and decisions after this has been completed. Tentatively planning for family meeting 3/2 in the afternoon. MGF to join via phone.    ??  4. ADVANCE CARE PLANNING  - [name] Gross Gross previously designated surrogate decision makers when he was not living with/communicating with his parents and was asked to consider whether this is still his wish during last admission. Do not see documentation of changes in this plan - parents are present and engaged in supporting [name] Gross Gross and making decisions for him as next of kin. They are communicating with his grandfather who was designated a Management consultant previously who will join family meeting 3/2.   - Code status: Full code. [name] Gross Gross has been considering his wishes around life-sustaining treatments using an ACP guide and has not felt comfortable making decisions. Counseled mother that medical team may make recommendations about life-sustaining treatments if he has an acute change in his condition - this would be influenced by his overall condition at the time and would reflect our best estimate of the likelihood of such interventions (intubation, CPR, etc) extending [name] Gross Gross's life. Some families appreciate recommendations and I wonder if this would be helpful to them given that they want to respect [name] Gross's wishes but he hasn't been able to express them.   - Prognosis: Very worrisome for [name] Gross Gross nearing end of life given severity of liver disease, severity of acute illness, embolic strokes, and potential for lack of response to therapies given how extremely ill he is at baseline. Family is aware he may not survive this admission but is feeling conflicted about what to do given that he has not been able to express his wishes clearly and wanting to wait for further information from the MRI.  ??  5. SUPPORT:   - Parents and living grandparents ([name], MGM, MGF) are supports for [name] Gross and have been providing care  - Joint visit made with [facility] SW, [employer]  - Hospice services have been in place through [facility] and [name] Gross and family have good relationships with this team  ??  6. CARE COORDINATION:   - Care coordinated with PICU team      Total time spent with patient for evaluation & management (excluding ACP documented separately): 60 minutes; 1:00 PM - 2:00 PM    Greater than 50% of this time spent on counseling/coordination of care: Yes.  See ACP Note from today for additional billable service: No     We appreciate the consult. The [facility]'s Supportive Care Team can be reached by pager [name]) or email ([email or web address]).   ??  ??  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 He has continued to struggle with decisions around his health care, maintaining some focus (or allowing family to do so - he seems ambivalent/exhausted/overwhelmed) on redo liver transplant but not clearly committing to the things needed to get him there. He and his family accepted a hospice referral given his prognosis and need for additional support, and he has been working with a team  from [facility] for several months. They reached out to [place] early last week when Marshal had been seen in clinic and declined admission for hydration/evaluation of electrolyte derangement. His family wanted him to be admitted and he was not willing. He was counseled by hospice team that not making a decision about what he wanted and staying home was effectively making a decision, and that he may not survive this illness. His symptoms progressed as the week went on and comfort medications were made available; hospice team continued to reach out and make visits.  Family continued to urge him to seek emergency care and he ultimately agreed - mother shared that it wasn't so much agreeing as stopping disagreeing, but because he has not clearly stated that he is ready to stop pursuing life extending treatments or to let the possibility of transplant go, they opted to bring him to the ER.    Interval history:  CT head showed new embolic strokes, planned for MRI. Continues to be altered, sleeping during visit and not responding to voice or touch. Mother, father, and [name] all at bedside. They reflect on the long journey has had since diagnosis and transplant in 2017, expressing their grief over his illness course and regret that they could not force him to continue taking medications and following up. They share their worries over his worsening and grief that he is not a transplant candidate. See above for further details.   ??   Allergies:  Bee pollen and Pollen extracts  ??  Medications:  Scheduled Meds:  ??? albumin human  25 g Intravenous TID   ??? albumin human       ??? Cefepime  2 g Intravenous Q12H   ??? hydrocortisone sod succ  50 mg Intravenous Q6H   ??? lactulose  10 g Enteral tube: gastric  BID   ??? metronidazole  250 mg Intravenous Q8H   ??? pantoprazole (PROTONIX) intravenous solutio  40 mg Intravenous Daily   ??? phytonadione (AQUA-MEPHYTON) intravenous  10 mg Intravenous Daily   ??? sucralfate  1 g Oral Q6H SCH   ??? sulfamethoxazole-trimethoprim  1 tablet Oral Once per day on Mon Wed Fri   ??? Tacrolimus  0.5 mg Enteral tube: gastric  BID   ??? valGANciclovir  450 mg Enteral tube: gastric  Daily   ??? vancomycin dilution  1,000 mg Intravenous Once     Continuous Infusions:  ??? heparin (porcine) 3 mL/hr (01/09/20 1500)   ??? norepinephrine (LEVOPHED) PEDIATRIC infusion Stopped (01/09/20 1114)   ??? PEDIATRIC Custom IV infusion builder 75 mL/hr (01/09/20 1500)     PRN Meds:.albuterol, carboxymethylcellulose sodium, HYDROmorphone, potassium chloride **OR** potassium chloride, ocular lubricant    Physical Exam:   Vitals:    01/09/20 1500   BP: 106/60   Pulse: 113   Resp: 14   Temp:    SpO2: 100%   ??  Gen: chronically ill young man lying on side, unresponsive to voice/touch, no obvious distress.   Skin: jaundiced, pale  HEENT: eyelids edematous, OP clear, MMM, patches of alopecia (recent haircut makes this apparent), audible stertor/secretions  Chest: normal WOB  Abd: protruberant but thin relative to last exam a month ago  Ext: no LE/UE edema  Neuro: unresponsive     Data: Reviewed test results in Epic.    Lab Results   Component Value Date    WBC 13.9 (H) 01/09/2020    HGB 6.0 (L) 01/09/2020    HCT 18.4 (L) 01/09/2020  PLT 136 (L) 01/09/2020       Lab Results   Component Value Date    NA 141 01/09/2020    K 2.8 (L) 01/09/2020    CL 113 (H) 01/09/2020    CO2  01/09/2020      Comment:      Specimen icteric.      BUN 42 (H) 01/09/2020    CREATININE 1.93 (H) 01/09/2020    GLU 248 (H) 01/09/2020    CALCIUM 7.8 (L) 01/09/2020    MG 2.0 01/09/2020    PHOS 3.6 01/09/2020       Lab Results   Component Value Date    BILITOT 41.0 (H) 01/09/2020    BILIDIR 37.40 (H) 01/09/2020    PROT  01/09/2020      Comment:      Specimen icteric.    ALBUMIN 2.5 (L) 01/09/2020    ALT  01/09/2020      Comment:      Specimen icteric.    AST 434 (H) 01/09/2020    ALKPHOS 889 (H) 01/09/2020    GGT 359 (H) 01/03/2020       Lab Results   Component Value Date    PT 33.6 (H) 01/09/2020    INR 2.97 01/09/2020    APTT 56.3 (H) 01/09/2020       Imaging reviewed.    Vertell Limber, MD  Attending Physician, Children's Supportive Care Team

## 2020-01-09 NOTE — Unmapped (Signed)
Assessment: [name] Gross is a 19 y.o. male with hx of Liver transplant    Shift Events: MAPs wax/waning throughout the shift, Noepi was titrated (see MAR). Continues to have obtunded mental status. PRBC and FFP product transfused    VITALS:  Vitals:    01/08/20 0005   Weight: 51.8 kg (114 lb 3.2 oz)      Weight change:    Dosing Weight:      Temp:  [36.4 ??C (97.5 ??F)-37 ??C (98.6 ??F)] 36.6 ??C (97.9 ??F)  Heart Rate:  [96-121] 106  SpO2 Pulse:  [96-117] 107  Resp:  [10-22] 21  BP: (87-118)/(43-81) 112/60  MAP (mmHg):  [55-95] 75  A BP-1: (97-142)/(41-70) 122/57  MAP:  [63 mmHg-87 mmHg] 76 mmHg  FiO2 (%):  [30 %-100 %] 30 %  SpO2:  [100 %] 100 %     Date 01/08/20 1901 - 01/09/20 0700 01/09/20 0701 - 01/10/20 0700   Shift 1901-0700 24 Hour Total 0701-1900 1901-0700 24 Hour Total   INTAKE   I.V. 1333.5 2914.6      Blood 215 215      NG/GT 10 10      IV Piggyback 400 608      Shift Total 1958.5 3747.6      OUTPUT   Urine 1017 3232      Shift Total 1017 3232            Access:  Patient Lines/Drains/Airways Status    Active Active Lines, Drains, & Airways     Name:   Placement date:   Placement time:   Site:   Days:    NG/OG Tube Feedings 14 Fr. Left nostril   01/08/20    1100    Left nostril   less than 1    Urethral Catheter Coude 14 Fr.   01/08/20    1300    Coude   less than 1    Peripheral IV 01/08/20 Posterior;Right Hand   01/08/20    0000    Hand   1    Peripheral IV 01/08/20 Left;Posterior Hand   01/08/20    0000    Hand   1    Peripheral IV (Ped) 01/08/20 Left Antecubital   01/08/20    ???     1    Arterial Line 01/08/20 Left Radial   01/08/20    0935    Radial   less than 1               Neuro:  Obtunded mental status. Only response is to withdraw from pain. No spontaneous movements seen in BUE. Will break gravity in BLE. Very significant periorbital edema with jaundiced sclera; PRN Dilaudid given x1 for restlessness and per parental request    Resp:  2LNC   Pt obstructing airway at start of shift with weak cough. Transitioned to Bipap with much improved respiratory survey. Lung sounds with increasing crackles, transitioned back to [place] this AM    CV:  WWP, afebrile. HR 100's. MAPs maintained >65. PIV x3 in place; Jaundiced skin color throughout; Platelets and PRBCs given.    GIGU:  NPO. NG continues to have blood residuals. hypoactive bowel sounds. Foley in place, UOP >1.5 cc/kg/hr    Family:  Pt's family (bio mom and dad) present during this shift. Parents reiterated throughout the shift that [name] Gross is very strong, and they want to continue fighting for him to get better.       Problem: Adult Inpatient  Plan of Care  Goal: Plan of Care Review  Outcome: Not Progressing  Goal: Patient-Specific Goal (Individualization)  Outcome: Not Progressing  Goal: Absence of Hospital-Acquired Illness or Injury  Outcome: Not Progressing  Goal: Optimal Comfort and Wellbeing  Outcome: Not Progressing  Goal: Readiness for Transition of Care  Outcome: Not Progressing  Goal: Rounds/Family Conference  Outcome: Not Progressing     Problem: Self-Care Deficit  Goal: Improved Ability to Complete Activities of Daily Living  Outcome: Not Progressing     Problem: Fall Injury Risk  Goal: Absence of Fall and Fall-Related Injury  Outcome: Not Progressing     Problem: Skin Injury Risk Increased  Goal: Skin Health and Integrity  Outcome: Not Progressing     Problem: Pain Chronic (Persistent) (Comorbidity Management)  Goal: Acceptable Pain Control and Functional Ability  Outcome: Not Progressing

## 2020-01-09 NOTE — Unmapped (Signed)
Pediatric Tacrolimus Therapeutic Monitoring Pharmacy Note    [name] Gross is a 19 y.o. male continuing tacrolimus.     Indication: Liver transplant     Date of Transplant: 2017      Prior Dosing Information: Current regimen tacrolimus 0.5 mg (~0.01 mg/kg) by mouth every 12 hours        Dosing Weight: 52.9 kg    Goals:  Therapeutic Drug Levels  Tacrolimus trough goal: 4-6 ng/mL    Additional Clinical Monitoring/Outcomes  ?? Monitor renal function (SCr and urine output) and liver function (LFTs)  ?? Monitor for signs/symptoms of adverse events (e.g., hyperglycemia, hyperkalemia, hypomagnesemia, hypertension, headache, tremor)    Results:   ?? Tacrolimus level: 2.2 ng/mL, drawn slightly late (12.4 hour level)    Pharmacokinetic Considerations and Significant Drug Interactions:  ? Concurrent hepatotoxic medications: None identified  ? Concurrent CYP3A4 substrates/inhibitors: None identified  ? Concurrent nephrotoxic medications: vancomycin (being dosed by level due to renal dysfunction)    Longitudinal Dose Monitoring:  Date Dose (AM / PM) Level  (ng/mL) AM Scr (mg/dL) Key Drug Interactions   01/09/20 0.5 mg / 0.5 mg 2.2 1.93 ---     Assessment/Plan:  This morning's level was collected slightly late and resulted as subtherapeutic level. However, today's level was collected to assure that he was not supratherapeutic in the setting of worsened liver and renal damage. This level may not represent a steady-state concentration if doses were missed in the past couple of days prior to admission.    Recommendedation(s)   ?? Continue current regimen of tacrolimus 0.5 mg (~0.01 mg/kg) by mouth every 12 hours        Follow-up  ? Next level has been ordered on 01/11/20 at 0730.    ? A pharmacist will continue to monitor and recommend levels as appropriate    The above plan was discussed with Ace Gins, ANP and Maryjean Morn, MD.    Please page service pharmacist with questions/clarifications.    Lars Pinks,  PharmD Pediatric Clinical Pharmacist  [phone] (pager)  [phone] (pediatric pharmacy)

## 2020-01-09 NOTE — Unmapped (Signed)
Pt remains in  PICU.    Neuro: Pt lethargic and drowsy this shift. At beginning of shift, A/O x3, disoriented to time. Became increasingly unresponsive throughout shift, lethargic, disoriented to place, time, and intermittently self. Head CT w/o contrast obtained this shift. Jaundiced notable in pupils, periorbital swelling that increased over shift. PERRL. Q2 neuro assessments remain in place. Weak movements in extremities. 2x PRN dilaudid given for pain, 1x ativan given for anxiety prior to CT.    Pulm: 2L nasal cannula in place for comfort. No desats this shift, Clear bilateral breath sounds.    Cardio: Afebrile. HR: 90s-110s. SBPs: 100s-140s. Remains on norepi drip @ 71mcgs/min. MAP goal >65 met. Left radial art line placed, 2:1 heparin on pressure bag infusing. Scheduled albumin given.    Access:   Patient Lines/Drains/Airways Status      Active Active Lines, Drains, & Airways       Name:   Placement date:   Placement time:   Site:   Days:    Urethral Catheter Coude 14 Fr.   01/08/20    1300    Coude   less than 1    Peripheral IV 01/08/20 Posterior;Right Hand   01/08/20    0000    Hand   less than 1    Peripheral IV 01/08/20 Left;Posterior Hand   01/08/20    0000    Hand   less than 1    Peripheral IV (Ped) 01/08/20 Left Antecubital   01/08/20         less than 1    Arterial Line 01/08/20 Left Radial   01/08/20    0935    Radial   less than 1                  GI/GU/DIET: NPO. NGT placed @ 55 cm. Clamped. MD aware of bleeding when pulling back residual. Indwelling foley inserted and remains in place. No BM this shift.     Skin: Pt itching self intermittently. No pressure injuries this shift.     Psych/Social: Family updated and at bedside. All questions and concerned addressed this shift.     Problem: Adult Inpatient Plan of Care  Goal: Plan of Care Review  Outcome: Ongoing - Unchanged  Goal: Patient-Specific Goal (Individualization)  Outcome: Ongoing - Unchanged  Goal: Absence of Hospital-Acquired Illness or Injury  Outcome: Ongoing - Unchanged  Goal: Optimal Comfort and Wellbeing  Outcome: Ongoing - Unchanged  Goal: Readiness for Transition of Care  Outcome: Ongoing - Unchanged  Goal: Rounds/Family Conference  Outcome: Ongoing - Unchanged     Problem: Self-Care Deficit  Goal: Improved Ability to Complete Activities of Daily Living  Outcome: Ongoing - Unchanged     Problem: Fall Injury Risk  Goal: Absence of Fall and Fall-Related Injury  Outcome: Ongoing - Unchanged     Problem: Skin Injury Risk Increased  Goal: Skin Health and Integrity  Outcome: Ongoing - Unchanged     Problem: Pain Chronic (Persistent) (Comorbidity Management)  Goal: Acceptable Pain Control and Functional Ability  Outcome: Ongoing - Unchanged

## 2020-01-09 NOTE — Unmapped (Addendum)
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 He was recently admitted for ARF, and is now presenting to [facility] from OSH with concern for sepsis or hypovolemic shock, and likely shock liver. Hospital course by system as follows:    Resp: Required intermittent [place] and BiPAP for obstructive breathing, but no episodes of hypoxia. No oxygen requirements upon discharge.     CV: Presented with hypotension, required NE to maintain adequate MAPs > 65, weaned off by 3/1. Given multiple embolic infarcts noted on head CT, echo was ordered and completed on 3/1 and showed a possible papillary vegetation in the LV.     Renal: Noted to have R hydronephrosis on 2/28 secondary to urinary retention with >1.5L of urine out with foley placement. Hydronephrosis resolved with decompression. Foley remained in place through discharge to hospice care.     FEN/GI: Patient NPO on arrival, given pressors and AMS. NGT placed for meds. Peds GI in transplant hematology consulted to assist with management.  Given albumin 25 g of 25% 3 times daily x3 days.  Home tacrolimus initially continued then discontinued given change in goals of care. Liver Doppler obtained showing normal hepatic transplant vasculature with patent veins. Given ongoing bleeding from NG tube, initially received pantoprazole and Carafate, both discontinued before discharge to hospice. Held fluids and feeds per family's wishes.    Heme: Coagulopathic on admission with hemoglobin 7.8, platelets 81.  INR peaked at 3***.  Given 10 mg IV vitamin K x3 days, finishing 3/2.  Required 1 unit of PRBCs and FFP on 3/1 for hemoglobin of 6 and elevated coags in setting of active bleeding from NG tube. DIC labs monitored.    ID: Transferred on broad spectrum antibiotics, continued vanc/cefepime/flagyl (2/27-3/2). OSH blood and urine cultures remained negative. Discontinued Valgancyclovir and Bactrim upon transition to hospice. Given transition of care to supportive and no signs of infectious etiology other than possible papillary vegetation noted on echo, forewent consulting peds ID.    Endocrine: Given chronic prednisone the patient was on at home, was placed on stress dose steroids on HD1 given his profound hypotension requiring pressors.  Continued hydrocortisone 50 mg every 6 hours (2/27-discharge) to allow stable transport to hospice facility.    Neuro: Patient presented with profound encephalopathy and altered mental status with known coagulopathy. Obtained head CT accordingly on 2/28, which showed multiple embolic infarcts. Neurology was consulted, who recommended further brain imaging with MRI/MRA, obtained on 3/1, which showed embolic infarcts without concern for mycotic aneurysm.     Social: Palliative care consulted given they know patient well and has followed previously on prior admissions.  Multiple goals of care conversations, ultimately resulting in decision to change patient status to DNR/DNI, discontinue all non-critical meds, and transport patient to inpatient hospice facility.

## 2020-01-09 NOTE — Unmapped (Addendum)
Social Work  Psychosocial Assessment    PSA: SW met with family at the bedside with PICU team. Patient is not conscious at this time. At the bedside are mom, dad, and grandmother. PICU team provided family with an update. Patient is no longer a transplant candidate. Patient to go to MRI to visualize stroke activity in his brain and to look at possible infection in his heart. Patient also to go for ultrasound to look for clots in his arms and legs. A family meeting is scheduled for tomorrow 01/10/20 to discuss goals of care, end of life discussions. Family at the bedside was engaged with medical team and expressed hope, concern, and compassion for patient's current condition and the Hx of health issues he has faced. SW introduced self and role. All family members were masked. [facility] staff wore mask and goggles.    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          Code status confirmed as full code with mother who was at bedside.    Patient Name: [name] Gross   Medical Record Number: [id number]  Date of Birth: 03-Jul-2001  Sex: Male     Referral  Referred by: Social Worker  Reason for Referral: End of Life Concerns (hospice, palliative care, etc.)  Comment: Family meeting tomorrow. Decision making. Quality of Life/End of life discussions. Supportive care involved.    Legal Next of Kin / Guardian / POA / Advance Directives  Secondary Emergency ContactNolon Stalls  Mobile Phone: [phone]  Relation: Mother  Preferred language: ENGLISH  Interpreter needed? No    Discharge Planning  Discharge Planning Information: TBD    Type of Residence   Mailing Address:  [address]  [place] [address]    Financial Information   Primary Insurance: Payor: MEDICAID [place] / Plan: MEDICAID [place] ACCESS / Product Type: *No Product type* /    Secondary Insurance: Secondary Insurance  MEDICAID [facility]*   Prescription Coverage: Medicaid   Preferred Pharmacy: [id number] - [place], [place] - [address] AT [facility] OF [address]  [facility] - [place] - [address]  [facility] - [place], [place] - [address]  [facility]  [facility]    Barriers to taking medication: No    Transition Home   Transportation at time of discharge: Family/Friend's Private Vehicle    Anticipated changes related to Illness: Poor prognosis. No further treatment available.   Services in place prior to admission: Home Based Services: [facility] anticipated for DC: N/A   Hemodialysis Prior to Admission: No    Readmission  Risk of Unplanned Readmission Score: UNPLANNED READMISSION SCORE: 32%  Readmitted Within the Last 30 Days? Yes  Readmission Factors include: unable to assess    Social Determinants of Health  Social Determinants of Health were addressed in provider documentation.  Please refer to patient history.    Social History  Support  Systems/Concerns: Parent, Family Members  In recent months patient has reconciled wit his mother and extended family. He is currently living with mom. Per chart review in most recent admission, patient stated that he is currently living with his mother but stays at his grandmother's house as well. Living at  his mother's house is patient, mom, stepfather, and his younger sister and brother.     Patient has long Hx of complex family dynamics, non compliance with medications and diet, as well as SI and SA in past - See psychiatric history.     Supportive care has been actively involved with patient over the last 6 months and have had many conversations re: goals of care and what his wishes are.     At this time, a family meeting will be held to discuss goals of care based on current status and poor outlook.    Medical and Psychiatric History  Psychosocial Stressors: Feelings of hopelessness about future and/or goals, Coping with health challenges/recent hospitalization, Grief/Loss (e.g. relationship, social, work, Surveyor, quantity), Family issues / concerns      Psychological Issues/Information: Comment, Mental illness(Patient is currently obtunded, unresponsive due to strokes. Past Hx of MH issues such as SI and SA.)   Concerns: Patient history of mental illness      Chemical Dependency: None      Legal: No legal issues      Ability to [employer]: No issues accessing community services      SW Interventions:  SW consulted with Dr. Jessee Avers re: status of advanced directives. At this time there is nothing on file in chart.     Parking voucher provided to grandmother. Mom on site without a vehicle here.    Park Meo, MSW, LCSW  Social Work Care Manager  [phone]  Pager: [phone]

## 2020-01-10 LAB — BASIC METABOLIC PANEL
BLOOD UREA NITROGEN: 35 mg/dL — ABNORMAL HIGH (ref 7–21)
BLOOD UREA NITROGEN: 40 mg/dL — ABNORMAL HIGH (ref 7–21)
BUN / CREAT RATIO: 19
CALCIUM: 7.2 mg/dL — ABNORMAL LOW (ref 8.5–10.2)
CALCIUM: 8.1 mg/dL — ABNORMAL LOW (ref 8.5–10.2)
CHLORIDE: 125 mmol/L — ABNORMAL HIGH (ref 98–107)
CHLORIDE: 118 mmol/L — ABNORMAL HIGH (ref 98–107)
CREATININE: 1.8 mg/dL — ABNORMAL HIGH (ref 0.70–1.30)
CREATININE: 1.92 mg/dL — ABNORMAL HIGH (ref 0.70–1.30)
EGFR CKD-EPI AA MALE: 62 mL/min/{1.73_m2} (ref >=60)
EGFR CKD-EPI AA MALE: 57 mL/min/{1.73_m2} — ABNORMAL LOW (ref >=60)
EGFR CKD-EPI NON-AA MALE: 53 mL/min/{1.73_m2} — ABNORMAL LOW (ref >=60)
EGFR CKD-EPI NON-AA MALE: 49 mL/min/{1.73_m2} — ABNORMAL LOW (ref >=60)
GLUCOSE RANDOM: 132 mg/dL (ref 70–179)
GLUCOSE RANDOM: 230 mg/dL — ABNORMAL HIGH (ref 70–179)
POTASSIUM: 2.6 mmol/L — CL (ref 3.5–5.0)
POTASSIUM: 3.2 mmol/L — ABNORMAL LOW (ref 3.5–5.0)
SODIUM: 151 mmol/L — ABNORMAL HIGH (ref 135–145)
SODIUM: 147 mmol/L — ABNORMAL HIGH (ref 135–145)

## 2020-01-10 LAB — CBC W/ AUTO DIFF
BASOPHILS ABSOLUTE COUNT: 0.1 10*9/L (ref 0.0–0.1)
BASOPHILS RELATIVE PERCENT: 0.4 %
EOSINOPHILS ABSOLUTE COUNT: 0 10*9/L (ref 0.0–0.4)
EOSINOPHILS RELATIVE PERCENT: 0.1 %
HEMATOCRIT: 21.7 % — ABNORMAL LOW (ref 41.0–53.0)
HEMOGLOBIN: 7.6 g/dL — ABNORMAL LOW (ref 13.5–17.5)
LYMPHOCYTES ABSOLUTE COUNT: 0.3 10*9/L — ABNORMAL LOW (ref 1.5–5.0)
LYMPHOCYTES RELATIVE PERCENT: 2.6 %
MEAN CORPUSCULAR HEMOGLOBIN CONC: 35.1 g/dL (ref 31.0–37.0)
MEAN CORPUSCULAR HEMOGLOBIN: 29.6 pg (ref 26.0–34.0)
MEAN CORPUSCULAR VOLUME: 84.3 fL (ref 80.0–100.0)
MONOCYTES ABSOLUTE COUNT: 0.7 10*9/L (ref 0.2–0.8)
MONOCYTES RELATIVE PERCENT: 5.2 %
NEUTROPHILS ABSOLUTE COUNT: 11.4 10*9/L — ABNORMAL HIGH (ref 2.0–7.5)
NEUTROPHILS RELATIVE PERCENT: 91.2 %
PLATELET COUNT: 106 10*9/L — ABNORMAL LOW (ref 150–440)
RED BLOOD CELL COUNT: 2.58 10*12/L — ABNORMAL LOW (ref 4.50–5.90)
RED CELL DISTRIBUTION WIDTH: 23 % — ABNORMAL HIGH (ref 12.0–15.0)
WBC ADJUSTED: 12.5 10*9/L — ABNORMAL HIGH (ref 4.5–11.0)

## 2020-01-10 LAB — BLOOD GAS CRITICAL CARE PANEL, ARTERIAL
BASE EXCESS ARTERIAL: -5.3 — ABNORMAL LOW (ref -2.0–2.0)
CALCIUM IONIZED ARTERIAL (MG/DL): 4.44 mg/dL (ref 4.40–5.40)
GLUCOSE WHOLE BLOOD: 250 mg/dL — ABNORMAL HIGH (ref 70–179)
HCO3 ARTERIAL: 18 mmol/L — ABNORMAL LOW (ref 22–27)
HEMOGLOBIN BLOOD GAS: 6.3 g/dL — ABNORMAL LOW (ref 13.50–17.50)
LACTATE BLOOD ARTERIAL: 2 mmol/L — ABNORMAL HIGH (ref <1.3)
O2 SATURATION ARTERIAL: 99.7 % (ref 94.0–100.0)
PCO2 ARTERIAL: 24.3 mmHg — ABNORMAL LOW (ref 35.0–45.0)
POTASSIUM WHOLE BLOOD: 3.2 mmol/L — ABNORMAL LOW (ref 3.4–4.6)
SODIUM WHOLE BLOOD: 148 mmol/L — ABNORMAL HIGH (ref 135–145)

## 2020-01-10 LAB — PHOSPHORUS
PHOSPHORUS: 2.5 mg/dL — ABNORMAL LOW (ref 2.9–4.7)
Phosphate:MCnc:Pt:Ser/Plas:Qn:: 2.5 — ABNORMAL LOW

## 2020-01-10 LAB — INR: Coagulation tissue factor induced.INR:RelTime:Pt:PPP:Qn:Coag: 2.51

## 2020-01-10 LAB — BILIRUBIN TOTAL: Bilirubin:MCnc:Pt:Ser/Plas:Qn:: 40.7 — ABNORMAL HIGH

## 2020-01-10 LAB — MAGNESIUM: Magnesium:MCnc:Pt:Ser/Plas:Qn:: 1.9

## 2020-01-10 LAB — ANISOCYTOSIS

## 2020-01-10 LAB — SLIDE REVIEW

## 2020-01-10 LAB — HEPATIC FUNCTION PANEL
ALBUMIN: 2.9 g/dL — ABNORMAL LOW (ref 3.5–5.0)
AST (SGOT): 185 U/L — ABNORMAL HIGH (ref 19–55)
BILIRUBIN DIRECT: 35.3 mg/dL — ABNORMAL HIGH (ref 0.00–0.40)

## 2020-01-10 LAB — VANCOMYCIN RANDOM: Vancomycin^random:MCnc:Pt:Ser/Plas:Qn:: 23.6

## 2020-01-10 LAB — FIBRINOGEN LEVEL: Fibrinogen:MCnc:Pt:PPP:Qn:Coag: 149 — ABNORMAL LOW

## 2020-01-10 LAB — CHLORIDE
Chloride:SCnc:Pt:Ser/Plas:Qn:: 118 — ABNORMAL HIGH
Chloride:SCnc:Pt:Ser/Plas:Qn:: 125 — ABNORMAL HIGH

## 2020-01-10 LAB — D-DIMER QUANTITATIVE (CH,ML,PD,ET): Fibrin D-dimer DDU:MCnc:Pt:PPP:Qn:: 813 — ABNORMAL HIGH

## 2020-01-10 LAB — PH ARTERIAL: pH:LsCnc:Pt:BldA:Qn:: 7.48 — ABNORMAL HIGH

## 2020-01-10 LAB — APTT: Coagulation surface induced:Time:Pt:PPP:Qn:Coag: 47.7 — ABNORMAL HIGH

## 2020-01-10 LAB — SMEAR REVIEW

## 2020-01-10 LAB — PROTIME-INR: INR: 2.51

## 2020-01-10 MED ORDER — GLYCOPYRROLATE 0.2 MG/ML INJECTION SOLUTION
Freq: Four times a day (QID) | SUBCUTANEOUS | 0 refills | 1.00000 days | PRN
Start: 2020-01-10 — End: 2020-02-09

## 2020-01-10 MED ORDER — GLYCOPYRROLATE 0.2 MG/ML INJECTION SOLUTION: 0 mg | mL | Freq: Four times a day (QID) | 0 refills | 1 days

## 2020-01-10 MED ORDER — LEVETIRACETAM 10 MG/ML IV PEDIATRIC
Freq: Two times a day (BID) | INTRAVENOUS | 0 refills | 30.00000 days
Start: 2020-01-10 — End: 2020-02-09

## 2020-01-10 MED ORDER — BISACODYL 10 MG RECTAL SUPPOSITORY
Freq: Every day | RECTAL | 0 refills | 12.00000 days | PRN
Start: 2020-01-10 — End: 2020-02-09

## 2020-01-10 MED ORDER — MORPHINE 2 MG/ML INJECTION PF WRAPPER
INTRAVENOUS | 0 refills | 0 days | PRN
Start: 2020-01-10 — End: 2020-01-15

## 2020-01-10 MED ORDER — ACETAMINOPHEN 650 MG RECTAL SUPPOSITORY
RECTAL | 0 refills | 2 days | PRN
Start: 2020-01-10 — End: ?

## 2020-01-10 MED ORDER — WHITE PETROLATUM-MINERAL OIL EYE OINTMENT WRAPPER
Freq: Three times a day (TID) | OPHTHALMIC | 0 refills | 0.00000 days | PRN
Start: 2020-01-10 — End: ?

## 2020-01-10 MED ORDER — HYDROCORTISONE SOD SUCCINATE (PF) 100 MG/2 ML SOLUTION INJ ACTOVIAL/VIAL
Freq: Four times a day (QID) | INTRAVENOUS | 0 refills | 0 days
Start: 2020-01-10 — End: ?

## 2020-01-10 MED ORDER — CARBOXYMETHYLCELLULOSE SODIUM 1 % EYE GEL IN A DROPPERETTE
Freq: Four times a day (QID) | OPHTHALMIC | 0 refills | 0 days | PRN
Start: 2020-01-10 — End: ?

## 2020-01-10 NOTE — Unmapped (Signed)
[facility]  Pediatric Hematology/Oncology Thrombosis Initial Consult Note   Date: 01/10/20  Patient Name: [name] Gross  MRN: [id number]  PCP: [name], MD  Requesting Attending Physician : Evalina Field, MD  Service Requesting Consult : Pediatric ICU (PMS)    Reason for consultation: embolic strokes/anticoagulation mgmt    Assessment:      [name] Gross  is a 19 y.o. male with with hx of primary sclerosing cholangitis s/p failed liver transplant who was admitted to [facility] for sepsis and shock liver and was found to have acute embolic strokes upon admission and possible papillary vegetation vs thrombus noted on echo. We are asked to consult with regards to anticoagulation management.    March has severe coagulopathy 2/2 liver dysfunction with known bleeding (NGT, black tarry stools) and it is unclear if his echo is consistent with thrombus or vegetation. As a result, we do not recommend anticoagulation at this time.       Patient Active Problem List   Diagnosis   ??? Adjustment disorder with anxious mood   ??? Malnutrition (CMS-HCC)   ??? History of liver transplant (CMS-HCC)   ??? Allergic rhinitis   ??? Major depressive disorder   ??? Transaminitis   ??? History of suicide attempt   ??? 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 Acute rejection of liver transplant (CMS-HCC)   ??? Liver transplant rejection (CMS-HCC)   ??? Normocytic anemia   ??? Hypoalbuminemia   ??? 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 High serum gamma glutamyl transferase (GGT)   ??? Steroid-induced hyperglycemia   ??? Pityrosporum folliculitis   ??? Thrombocytopenia (CMS-HCC)   ??? Ascites of liver   ??? Hyperbilirubinemia   ??? Steroid-induced acne   ??? 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 ATN (acute tubular necrosis) (CMS-HCC)   ??? Pleural effusion   ??? Hypokalemia   ??? Rectal bleeding   ??? Coagulopathy (CMS-HCC)   ??? Candidal esophagitis (CMS-HCC)   ??? Fibrinogen decreased (CMS-HCC)   ??? Vitamin D deficiency   ??? Sepsis (CMS-HCC)   ??? Hypovolemic shock         Plan:        #VTE ppx: given coagulopathy, known bleeding, and unclear echo, we do not recommend prophylactic or therapeutic anticoagulation at this time.   -continue collaborating with neurology for embolic strokes  -continually reassess bleeding/clotting risk    Thank you for allowing [place] to be involved in the care of Samanyu.  If there are any questions, please contact the peds heme onc consult pager. We will continue to follow along with you.        Subjective:       HPI: [name] Gross  is a 19 y.o. male with PMH??of??asthma, cryptogenic cirrhosis??thought to be secondary to unconfirmed PSC, with liver transplant in 2017, now with??chronic graft rejection, grade 1 esophageal varices and??sickle cell trait admitted to [facility] for sepsis/liver shock seen in consultation at the request of Dr. Evalina Field, MD for anticoagulation management.     Medical records reviewed from Epic and history obtained from family and medical records.    [name] Gross??is a 20 y.o.??with??PMH??of??asthma, depression, severe protein-calorie malnutrition, cryptogenic cirrhosis??thought to be 2/2 unconfirmed primary sclerosing cholangitis??s/p liver transplant in 2017 with??chronic graft rejection, grade 1 esophageal varices and??sickle cell trait with recent admission for ARF who presents from OSH with concern for sepsis  and shock liver. Coagulopathic on admission with hemoglobin 7.8, platelets 81, INR 3.  Given 10 mg IV vitamin K x3 days.  Required 1 unit of PRBCs and FFP on 3/1 for hemoglobin of 6 and elevated coags in setting of active bleeding from NG tube. Encephalopathy with embolic events seen on head CT. Echo completed on 3/1 given multiple embolic infarcts noted on head CT which showed concern for possible papillary vegetation vs thrombus.     Past Medical History:  Past Medical History:   Diagnosis Date   ??? 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 Depression    ??? MSSA bacteremia 08/27/2019   ??? Seasonal allergies    ??? Sickle cell trait (CMS-HCC)    ??? Strep Mitis Central line-associated bloodstream infection 08/27/2019     Immunization History   Administered Date(s) Administered   ??? DTaP 06/12/2000, 10/13/2000, 01/06/2001, 08/16/2001, 06/24/2004   ??? HPV Quadrivalent (Gardasil) 04/17/2011, 05/27/2012, 05/11/2013   ??? Hepatitis A 09/11/2006, 01/23/2009   ??? Hepatitis B vaccine, pediatric/adolescent dosage, 11/28/99, 04/24/2000, 01/06/2001   ??? HiB-PRP-OMP 06/12/2000, 10/13/2000, 01/06/2001, 05/18/2001   ??? Influenza LAIV (Nasal-Quad) 2-81yrs 09/14/2014   ??? Influenza Vaccine Quad (IIV4 PF) 22mo+ injectable 10/12/2017, 09/10/2019   ??? MMR 05/18/2001, 06/24/2004   ??? Meningococcal Conjugate MCV4P 04/17/2011, 05/22/2016   ??? PPD Test 05/17/2016   ??? Pneumococcal Conjugate 13-Valent 06/12/2000, 10/13/2000, 01/06/2001   ??? Poliovirus,inactivated (IPV) 06/12/2000, 10/13/2000, 05/18/2001, 06/24/2004   ??? TdaP 04/17/2011   ??? Varicella 05/18/2001, 07/24/2005, 09/11/2006     Development History: previously developmentally appropriate    Past Surgical History:  Past Surgical History:   Procedure Laterality Date   ??? FRENULECTOMY, LINGUAL     ??? PR ERCP REMOVE FOREIGN BODY/STENT BILIARY/PANC DUCT N/A 08/20/2017    Procedure: ENDOSCOPIC RETROGRADE CHOLANGIOPANCREATOGRAPHY (ERCP); W/ REMOVAL OF FOREIGN BODY/STENT FROM BILIARY/PANCREATIC DUCT(S);  Surgeon: Vonda Antigua, MD;  Location: GI PROCEDURES MEMORIAL [facility];  Service: Gastroenterology   ??? PR ERCP REMOVE FOREIGN BODY/STENT BILIARY/PANC DUCT N/A 06/04/2018    Procedure: ENDOSCOPIC RETROGRADE CHOLANGIOPANCREATOGRAPHY (ERCP); W/ REMOVAL OF FOREIGN BODY/STENT FROM BILIARY/PANCREATIC DUCT(S); Surgeon: Chriss Driver, MD;  Location: GI PROCEDURES MEMORIAL [facility];  Service: Gastroenterology   ??? PR ERCP STENT PLACEMENT BILIARY/PANCREATIC DUCT N/A 11/21/2016    Procedure: ENDOSCOPIC RETROGRADE CHOLANGIOPANCREATOGRAPHY (ERCP); WITH PLACEMENT OF ENDOSCOPIC STENT INTO BILIARY OR PANCREATIC DUCT;  Surgeon: Vonda Antigua, MD;  Location: GI PROCEDURES MEMORIAL [facility];  Service: Gastroenterology   ??? PR ERCP,W/REMOVAL STONE,BIL/PANCR DUCTS N/A 08/20/2017    Procedure: ERCP; W/ENDOSCOPIC RETROGRADE REMOVAL OF CALCULUS/CALCULI FROM BILIARY &/OR PANCREATIC DUCTS;  Surgeon: Vonda Antigua, MD;  Location: GI PROCEDURES MEMORIAL [facility];  Service: Gastroenterology   ??? PR ERCP,W/REMOVAL STONE,BIL/PANCR DUCTS N/A 06/04/2018    Procedure: ERCP; W/ENDOSCOPIC RETROGRADE REMOVAL OF CALCULUS/CALCULI FROM BILIARY &/OR PANCREATIC DUCTS;  Surgeon: Chriss Driver, MD;  Location: GI PROCEDURES MEMORIAL [facility];  Service: Gastroenterology   ??? PR TRANSPLANT LIVER,ALLOTRANSPLANT N/A 09/22/2016    Procedure: LIVER ALLOTRANSPLANTATION; ORTHOTOPIC, PARTIAL OR WHOLE, FROM CADAVER OR LIVING DONOR, ANY AGE;  Surgeon: Doyce Loose, MD;  Location: MAIN OR [facility];  Service: Transplant   ??? PR TRANSPLANT,PREP DONOR LIVER, WHOLE N/A 09/22/2016    Procedure: [facility] STD PREP CAD DONOR WHOLE LIVER GFT PRIOR TNSPLNT,INC CHOLE,DISS/REM SURR TISSU WO TRISEG/LOBE SPLT;  Surgeon: Doyce Loose, MD;  Location: MAIN OR [facility];  Service: Transplant   ??? PR UPGI ENDOSCOPY W/US FN BX N/A 08/20/2017    Procedure: UGI W/ TRANSENDOSCOPIC ULTRASOUND GUIDED INTRAMURAL/TRANSMURAL FINE NEEDLE ASPIRATION/BIOPSY(S), ESOPHAGUS;  Surgeon: Vonda Antigua, MD;  Location: GI PROCEDURES MEMORIAL [facility];  Service: Gastroenterology   ??? PR UPPER GI ENDOSCOPY,BIOPSY N/A 07/15/2016    Procedure: UGI ENDOSCOPY; WITH BIOPSY, SINGLE OR MULTIPLE;  Surgeon: Arnold Long Mir, MD;  Location: PEDS PROCEDURE ROOM [facility];  Service: Gastroenterology   ??? PR UPPER GI ENDOSCOPY,CTRL BLEED Left 05/05/2016    Procedure: UGI ENDOSCOPY; WITH CONTROL OF BLEEDING, ANY METHOD;  Surgeon: Arnold Long Mir, MD;  Location: PEDS PROCEDURE ROOM [facility];  Service: Gastroenterology   ??? PR UPPER GI ENDOSCOPY,CTRL BLEED N/A 05/12/2016    Procedure: UGI ENDOSCOPY; WITH CONTROL OF BLEEDING, ANY METHOD;  Surgeon: Annie Paras, MD;  Location: CHILDRENS OR [facility];  Service: Gastroenterology   ??? PR UPPER GI ENDOSCOPY,DIAGNOSIS N/A 09/21/2019    Procedure: UGI ENDO, INCLUDE ESOPHAGUS, STOMACH, & DUODENUM &/OR JEJUNUM; DX W/WO COLLECTION SPECIMN, BY BRUSH OR WASH;  Surgeon: Monte Fantasia, MD;  Location: GI PROCEDURES MEMORIAL [facility];  Service: Gastroenterology   ??? PR UPPER GI ENDOSCOPY,DIAGNOSIS N/A 12/13/2019    Procedure: UGI ENDO, INCLUDE ESOPHAGUS, STOMACH, & DUODENUM &/OR JEJUNUM; DX W/WO COLLECTION SPECIMN, BY BRUSH OR WASH;  Surgeon: Neysa Hotter, MD;  Location: GI PROCEDURES MEMORIAL [facility];  Service: Gastroenterology   ??? PR UPPER GI ENDOSCOPY,TUBE PLACE N/A 12/13/2019    Procedure: UGI ENDO; W/TRANSENDOSCOPIC TUBE/CATH PLCMT;  Surgeon: Neysa Hotter, MD;  Location: GI PROCEDURES MEMORIAL [facility];  Service: Gastroenterology       Family History:  Family History   Problem Relation Age of Onset   ??? Diabetes Maternal Grandmother    ??? Diabetes Paternal Grandmother    ??? Hypertension Maternal Grandfather    ??? Hypertension Paternal Grandfather    ??? Asthma Brother    ??? Anesthesia problems Neg Hx    ??? Melanoma Neg Hx    ??? Basal cell carcinoma Neg Hx    ??? Squamous cell carcinoma Neg Hx      [place] does not have a VTE history in the family.    Social History:  Social History     Social History Narrative    Lives part time with mother and part time with MGM.        Medications:  Scheduled Meds  ??? albumin human  25 g Intravenous TID   ??? Cefepime  2 g Intravenous Q12H   ??? hydrocortisone sod succ  50 mg Intravenous Q6H   ??? lactulose  10 g Enteral tube: gastric  BID   ??? metronidazole  250 mg Intravenous Q8H   ??? pantoprazole  40 mg Enteral tube: gastric  BID   ??? sucralfate  1 g Oral Q6H SCH   ??? [START ON 01/11/2020] sulfamethoxazole-trimethoprim  1 tablet Enteral tube: gastric  Once per day on Mon Wed Fri   ??? Tacrolimus  0.5 mg Enteral tube: gastric  BID   ??? valGANciclovir  450 mg Enteral tube: gastric  Daily       Scheduled Infusions  ??? heparin (porcine) 3 mL/hr (01/10/20 1000)   ??? sodium chloride 3 mL/hr (01/10/20 1000)   ??? PEDIATRIC Custom IV infusion builder 75 mL/hr (01/10/20 1000)       PRN Meds  albuterol, carboxymethylcellulose sodium, potassium chloride **OR** potassium chloride, ocular lubricant    Home Meds  Prior to Admission medications  Medication Sig Start Date End Date Taking? Authorizing Provider   calcium carbonate (TUMS) 200 mg calcium (500 mg) chewable tablet Chew 3 tablets (600 mg of elem calcium total) Three (3) times a day. 12/22/19 01/21/20 Yes Murvin Donning, MD   clindamycin-benzoyl peroxide 1.2 %(1 % base) -5 % gel Apply to rash on chest  Patient taking differently: Apply 1 application topically nightly. Apply to rash on chest 09/08/19  Yes Alfredia Ferguson, MD   hydrOXYzine (ATARAX) 25 MG tablet Take 1 tablet (25 mg total) by mouth every six (6) hours as needed. 12/22/19  Yes Murvin Donning, MD   magnesium oxide (MAGOX) 400 mg (241.3 mg magnesium) tablet Take 1 tablet (400 mg total) by mouth Two (2) times a day. 01/05/20  Yes Glorianne Manchester, MD   mirtazapine (REMERON) 7.5 MG tablet Take 1 tablet (7.5 mg total) by mouth nightly. 12/22/19 02/20/20 Yes Murvin Donning, MD   MVW Complete, pediatric multivit 61-D3-vit K, (MVW COMPLETE FORMULATION) 1,500-800 unit-mcg cap Take 1 capsule by mouth daily. 12/22/19  Yes Murvin Donning, MD   mycophenolate (MYFORTIC) 180 MG EC tablet Take 3 tablets (540 mg total) by mouth two (2) times a day. 12/22/19 12/21/20 Yes Murvin Donning, MD   ondansetron (ZOFRAN-ODT) 4 MG disintegrating tablet Take 1 tablet (4 mg total) by mouth every eight (8) hours as needed for up to 7 days. 09/07/19  Yes Alfredia Ferguson, MD   pantoprazole (PROTONIX) 40 MG tablet Take 1 tablet (40 mg total) by mouth daily. 01/05/20  Yes Glorianne Manchester, MD   predniSONE (DELTASONE) 20 MG tablet Take 1 tablet (20 mg total) by mouth daily. 12/22/19  Yes Murvin Donning, MD   rifAXIMin [place]man [place]) 550 mg Tab Take 1 tablet (550 mg total) by mouth Two (2) times a day. 11/08/19  Yes Glorianne Manchester, MD   sulfamethoxazole-trimethoprim (BACTRIM) 400-80 mg per tablet Take 1 tablet (80 mg of trimethoprim total) by mouth Every Monday, Wednesday, and Friday. 11/09/19 02/06/20 Yes Glorianne Manchester, MD   tacrolimus (PROGRAF) 0.5 MG capsule Take 1 capsule (0.5 mg total) by mouth two (2) times a day. 12/22/19 12/21/20 Yes Murvin Donning, MD   thiamine (B-1) 100 MG tablet Take 1 tablet (100 mg total) by mouth daily for 60 doses. 12/22/19 04/01/20 Yes Murvin Donning, MD   valGANciclovir (VALCYTE) 450 mg tablet Take 1 tablet (450 mg total) by mouth daily. 11/08/19 02/08/20 Yes Glorianne Manchester, MD   albuterol HFA 90 mcg/actuation inhaler Inhale 2 puffs every four (4) hours as needed (intractable coughing). 11/30/19 11/29/20  Glorianne Manchester, MD   benzonatate (TESSALON) 100 MG capsule Take by mouth daily as needed. 11/24/19   Historical Provider, MD   ergocalciferol (DRISDOL) 1,250 mcg (50,000 unit) capsule Take 1 capsule (50,000 Units total) by mouth once a week for 3 doses. 12/28/19 01/13/20  Murvin Donning, MD       Allergies:  Allergies   Allergen Reactions   ??? Bee Pollen Rash     Pt has seasonal allergies that cause excessive sneezing and running nose- [name], NAII   ??? Pollen Extracts Rash     Pt has seasonal allergies that cause excessive sneezing and running nose- [name], NAII       Review of Systems:  Did not assess re: pt unresponsive       Objective:   Objective:   Physical Exam:   Vitals:   Vitals:  01/10/20 0800 01/10/20 0814 01/10/20 0900 01/10/20 1000   BP: 107/58 113/66 125/64   Pulse: 112 91 116 116   Resp: 17 18 24 20    Temp: 36.6 ??C (97.9 ??F)      TempSrc: Axillary      SpO2: 100% 100% 100% 100%   Weight:           Growth:   Wt Readings from Last 3 Encounters:   01/08/20 51.8 kg (114 lb 3.2 oz) (1 %, Z= -2.21)*   12/23/19 56.7 kg (125 lb) (7 %, Z= -1.49)*   10/24/19 54 kg (119 lb) (3 %, Z= -1.85)*     * Growth percentiles are based on CDC (Boys, 2-20 Years) data.        Body surface area is 1.56 meters squared.  Body mass index is 17.88 kg/m??.    Growth Percentiles:   1 %ile (Z= -2.21) based on CDC (Boys, 2-20 Years) weight-for-age data using vitals from 01/08/2020.  No height on file for this encounter.  <1 %ile (Z= -2.35) based on CDC (Boys, 2-20 Years) BMI-for-age data using weight from 01/08/2020 and height from 12/11/2019.    I/O this shift:  In: 309.2 [I.V.:309.2]  Out: 150 [Urine:150]      General Appearance: Obtunded, not arousable, slight jaundice appreciated   HEENT: NGT to left nare in place, MMM, eyes partially closed, no discharge from eyes or nose   CV: RRR, clear s1,s2, no MRG, radial and DP pulses 2+ and equal bilaterally   Lungs: Symmetrical chest expansion, no retractions, CTA/B, no rales/ronchi/wheezes   Abd: Soft with slight fullness/distention, BSx 4 quadrants   MSK: Cool extremities, no erythema   Skin: Warm, dry, intact.  Scattered dark splotches across chest   Neurologic: No reaction to voice/commads, clonus noted to feet with dorsiflexion          Diagnostic Evaluation:        CBC w/diff:   Lab Results   Component Value Date    WBC 12.5 (H) 01/10/2020    RBC 2.58 (L) 01/10/2020    HGB 7.6 (L) 01/10/2020    HCT 21.7 (L) 01/10/2020    MCV 84.3 01/10/2020    MCH 29.6 01/10/2020    MCHC 35.1 01/10/2020    RDW 23.0 (H) 01/10/2020    MPV 6.6 (L) 01/10/2020    PLT 106 (L) 01/10/2020    NEUTROPCT 91.2 01/10/2020    LYMPHOPCT 2.6 01/10/2020    MONOPCT 5.2 01/10/2020    EOSPCT 0.1 01/10/2020    BASOPCT 0.4 01/10/2020    NEUTROABS 11.4 (H) 01/10/2020 LYMPHSABS 0.3 (L) 01/10/2020    MONOSABS 0.7 01/10/2020    BASOSABS 0.1 01/10/2020    EOSABS 0.0 01/10/2020    [id number] 0 02/11/2019    HYPOCHROM Marked (A) 01/03/2020       Iron Panel and Ferritin:  Lab Results   Component Value Date    IRON 188 (H) 09/30/2019    TIBC 232.5 (L) 09/30/2019    TRANSFERRIN 184.5 (L) 09/30/2019    LABIRON 81 (H) 09/30/2019    FERRITIN 22.2 02/05/2018       Coagulation:  Lab Results   Component Value Date    PT 28.6 (H) 01/10/2020    INR 2.51 01/10/2020    APTT 47.7 (H) 01/10/2020    FIBRINOGEN 149 (L) 01/10/2020    DDIMER 813 (H) 01/10/2020        Factor Levels:  Lab Results   Component Value Date  FII 117 09/21/2016     No components found for: FVIII    Thrombophilia Testing:  Lab Results   Component Value Date    PROTIENC 34 (L) 09/21/2019    PROTEINS 30 (L) 09/21/2019    AT3 66 (L) 09/21/2019    AT3 126 (H) 09/21/2016       Lupus Inhibitor Panel  No results found for: DRVVT, DRVVTCONF, DRVVTPANEL, PTTLA, ANTIPLINTRP    Beta-2 Glycoprotein antibodies:  No results found for: B2GLYCOIGG, B2GLYCOIGM    Cardiolipin Antibodies:   No results found for: CRDLPNIGG, CRDLPNIGM    Bleeding Testing:  Platelet Function:  No results found for: PLTFS    Renal Function:  Lab Results   Component Value Date    NA 148 (H) 01/10/2020    NA 147 (H) 01/10/2020    K 3.2 (L) 01/10/2020    K 3.2 (L) 01/10/2020    CL 118 (H) 01/10/2020    CO2  01/10/2020      Comment:      Specimen icteric.    BUN 40 (H) 01/10/2020    CREATININE 1.92 (H) 01/10/2020    GLU 230 (H) 01/10/2020    CALCIUM 8.1 (L) 01/10/2020    MG 1.9 01/10/2020    PHOS 2.5 (L) 01/10/2020     Liver Function:  Lab Results   Component Value Date    AST 185 (H) 01/10/2020    AST 254 (H) 01/09/2020    AST 434 (H) 01/09/2020    ALT  01/10/2020      Comment:      Specimen icteric.    ALT  01/09/2020      Comment:      Specimen icteric.      ALT  01/09/2020      Comment:      Specimen icteric.     Lorelee Cover, FNP-C  Pediatric Hemostasis/Thrombosis NP  [facility] Division of Pediatric Hematology/Oncology  Pgr# [phone]  01/10/2020    CC:   [name], MD  Pcp, Unknown Per Patient

## 2020-01-10 NOTE — Unmapped (Signed)
Inpatient Pediatric Nutrition Consult Note      Reason for Consult:   Visit Type: MD Consult, RN Consult  Reason for Visit:  Assessment, Malnutrition, Decrease PO, Weight loss     History of Present Illness: [name] Gross is a 19 y.o. 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 Status is tenuous with unclear prognosis and progressive decline with MSOF and persistent encephalopathy. Currently requiring pressor support.     Anthropometrics:  Current Wt: 51.8 kg (114 lb 3.2 oz) 1 %ile (Z= -2.21) based on CDC (Boys, 2-20 Years) weight-for-age data using vitals from 01/08/2020.  Length or Height:   No height on file for this encounter.  Current BMI:  Body mass index is 17.88 kg/m??. <1 %ile (Z= -2.35) based on CDC (Boys, 2-20 Years) BMI-for-age data using weight from 01/08/2020 and height from 12/11/2019.     Admission Wt: 51.8 kg (114 lb 3.2 oz)  Ideal Body Wt:  66.1 kg   Estimated Dry Wt: 52.3kg (115 lbs) reported by patient 09/2019     Dosing Wt:   52.9kg (from previous admission)    Growth velocity/trends: 2% weight loss x 1 month; however weight difficult to interpret due to fluid/ascites/edema    Nutrition-Focused Physical Exam:  Nutrition Evaluation  Nutrition Designation: Underweight (BMI < 18.50  kg/m2) (01/09/20 1726)    Nutritionally Relevant Data:  Meds: Nutritionally-relevant medications reviewed. Medications include cefepime, albumin 25%, hydrocortisone, lactulose, flagyl, protonix, vitamin K, carafate, tacrolimus   IV Fluids: D5-1/2 NS with 1/2 NaAcetate @ 75 mL/hr  Infusions: insulin gtt    Labs:  Low ionized Calcium, hyperglycemia, hypokalemia     Emesis: 0  Stools: today    Home Nutrition History: Food Allergies/Intolerances:  None on file     PO Diet:  Regular; poor PO intake for days prior to admission     Previous admission was on TF Vital AF 1.2 up to 75% goal but pt removed feeding tube due to abdominal fullness and wanting to trial PO independently at home. Was recommended to follow high calorie, high protein diet with small frequent meals 6-7 times/day     Current Medical Nutrition Therapy:  Enteral/Parenteral Access: PIV x 3, Art line, NGT     NPO    Estimated Nutrient Needs:  Nutrition Calculation Weight: Current  Calculation Method: Calculated REE((1471) x 1.3-1.5)     Estimated Energy Needs: (38-42 kcals/kg)  Total Protein Estimated Needs: 1.5-2 grams/kg  Total Fluid Estimated Needs: Per Medical Team    Weight and Growth Goal:  Weight Gain Velocity Goal: Prevent weight loss during hospitalization  Recommended body mass index (BMI): 25-50 %    Pediatric AND/ASPEN Malnutrition Screening:  Primary Indicator of Malnutrition  Body Mass Index (BMI) for age z-score (55-37 years of age): -3 or less, indicating SEVERE protein-calorie malnutrition  Chronicity: Chronic (>/equal to 3 months)  Etiology  Illness-related: Decreased nutrient intake, Increased nutrient requirement  Overall Impression: Patient meets criteria for SEVERE protein-calorie malnutrition (01/09/20 1727)    Nutrition Assessment:  Nutrition consulted for assessment and TF recommendations. Current weight 51.8kg has now reached below estimated dry weight (52.3kg). Weight history difficult to accurately assess due to fluid/ascites; however, given the unlikelihood of height decline, will use average trend of ~175.5cm as pt's adult height  which places BMI z-score -3.13 meeting criteria for severe malnutrition. Presents with days of poor PO intake and refusing meals p/w increased fatigue also contributing factor to malnourished state. Has been NPO since admission while on pressor support. NGT in place with low threshold to initiate enteral feeds for nutrition support pending clinical status. Last admission, had difficulty advancing tube feeds due to bloating/abdominal distention on Vital AF 1.2. Recommendations below to trial more concentrated formula with whey based protein for gastric emptying and low osmolarity for tolerance.      Nutrition Interventions/Recommendations:   ?? When medically permissible and stable to start enteral nutrition, recommend initiating non-formulary trophpic feeds with Peptamen 1.5 at 10 ml/hr. Advance as tolerated by 10 mL/hr q 6 hours to goal rate of 60 mL/hr. Will provide 42 kcals/kg, 1.9 g/kg protein, 270 grams CHO total.   ?? Once feeds at goal, consider FWF at minimum 30 mL q 6 hours for tube patency or can increase up to 75-150 mL q 4 hours to meet 75-100% maintenance fluid from free water pending fluid status   ?? Formula is ready-to-hang therefore will need order for Kangaroo pump.   ?? Weight 1-2 times weekly while IP as appropriate   Service RD to f/u on clinical course and adjust nutritional POC accordingly.  ?? Appreciate consult service recommendations and will follow up for adjustments to nutritional interventions/plan of care as indicated.     Discussed with: PICU team on medical rounds   Signed by:  Othelia Pulling MS, RDN, LDN   Pager #: [phone]

## 2020-01-10 NOTE — Unmapped (Signed)
Pediatric Palliative Care Consult Note:  ??  [name] Gross is a 19 y.o. male seen for pediatric palliative care consultation at the request of Dr. Arletha Pili for goals of care, advance care planning, and family support.  ??  Primary Care Provider: [name], MD  ??  History provided by: Mother   ??  Assessment:  ??  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 He is encephalopathic and unable to communicate. He has also begun to develop seizure-like activity. Family would like to focus on supporting him being comfortable and planning for a transition to inpatient hospice.  ??  Summary of Discussion and Recommendations:   ??  1. SYMPTOMS:  Appears with increased WOB and eye opening with any care times. Settles out when left to rest. Also with concern for seizure-like activity.  **Comfort-focused medications:    Pain and dyspnea  - Morphine 2 mg IV q2h PRN  - Albuterol PRN  - Glycopyrrolate for secretions PRN    Constipation  - Dulcolax supp every day PRN    Anxiety  - Ativan 1 mg IV q30 min PRN    Seizures  - Keppra 1000 mg loading dose as per Neuro, continued with 500 mg BID  - Ativan as above  ??  2. GOALS:   - This admission, family has learned that he has had embolic strokes and is not able to be a transplant candidate. MRI confirmed these findings and they have learned from Neuro that he will have significant debility due to these strokes.  - They would like to focus on his comfort, keeping him from suffering and treating any signs or symptoms of distress. They would prefer to take him home if possible but understand the concern about safely managing symptoms he may develop and worries that he is nearing end of life, and have decided to transition to inpatient hospice.  - They would like his siblings and other family members to be able to visit him and spend time with him  ??  3. DECISIONS:   - Family has opted to focus on comfort and transition Rollie to inpatient hospice. They do not want to continue aggressive care in light of his embolic strokes and inability to be a transplant candidate and do not believe he would want to continue suffering without being able to return to his prior quality of life.  - Comfort-focused care; discharge to inpatient hospice on 3/3 if remains stable for transport    4. ADVANCE CARE PLANNING  - Famile meeting held with his parents, [name], MGF, and step-father who are all in agreement with proceeding with comfort-focused care.  - Code status: DNR/DNI  - Prognosis: Poor, [name] Gross is nearing end of life. Shared with family concern that he has days of time left and that something may suddenly happen given his overall clinical severity.   ??  5. SUPPORT:   - Parents and living grandparents ([name], MGM, MGF) are supports for [name] and have been providing care. His siblings are planning to visit  this evening.  - Child Life engaging in Scientist, research (physical sciences)  - Chaplain support engaged for [facility] and other family members as needed  - Hospice services have been in place through [facility], medical and [facility] and family have good relationships with this team. His primary nurse Lupita Leash has been in close contact with mom throughout this admission and in planning for transition to their inpatient hospice unit.  ??  6. CARE COORDINATION:   - Care coordinated with PICU team, Case Management, nursing, [facility] team    7. FAMILY MEETING  - Please see ACP note from 3/2 for further information -Family meeting held with patient's mother, stepfather, father, paternal grandmother in person and maternal grandfather available via phone.  -Team members available included PICU team, neurology, bedside nursing, and supportive care team   - Medical update provided by the PICU and neurology regarding his recent embolic strokes and concern for possible clot or vegetation in his heart that have led to the strokes.  Questions answered from the family regarding when the strokes possibly occurred, outlining that several small strokes could have occurred over the past week that led to his changing mental status but it is hard to say of the exact timing.  Counseled on how it would have been impossible for the family to know that strokes were occurring and how this differs from large strokes that often occur in elderly populations of which they are more familiar.  Neurology reiterated concerns that because of the damage from the strokes Benedict will not regain prior function and we would would be dependent for all care and likely to be more minimally interactive with his environment.  It was reaffirmed that he is not a transplant candidate.  - Family had had previous discussions about possibility of transitioning to a comfort focused plan of care and reiterated that this is what they would like to do at this time for [name] Gross.  They feel he had been considering this for himself prior to the strokes with his refusal of care over the past several weeks and comments of wanting to be made comfortable.  They feel that this is especially important to do in light of his recent strokes and knowing that he would never again to be able to be independent, which is extremely important to [name] Gross.  He would like to focus on relieving his suffering and ensuring that he is comfortable for whatever time is left.  - Family asked specifics of what comfort care would look like, provided education on symptomatic management of pain, dyspnea, and anxiety, agitated delirium or confusion, and seizures, as well as any other distressing symptoms he may develop.  Family requested no further lab draws and discontinuing medications that are not focused on maintaining his comfort.  Advised that fluids and nutrition would not be continued in the comfort setting with concerns that this would lead to edema and difficulty breathing as his organ systems continue to shut down.  - Recommendation made for DNR/DNI and family all affirms that this would best be in line with their goals for Vicki and ensuring a natural, comfortable death.  -Explained concern that he may develop worsening symptoms in the coming hours to days due to his severe clinical status and recommended that he go to an inpatient hospice unit as opposed to home hospice.  Explained that if he did end up having a prognosis of days it would be possible to consider stabilizing his symptoms and transitioning to  home hospice at that time, but given his current instability, recommendation would be for inpatient hospice.  Family is in agreement.  - Family meeting included active listening, empathetic presence, life review, use of therapeutic silence, and review of discussion and GOC.        Total time spent with patient for evaluation & management (excluding ACP documented separately): 120 minutes; 11:00 AM - 11:30 AM; 1:00 PM - 2:00 PM; 3:30 PM - 4:00 PM    Greater than 50% of this time spent on counseling/coordination of care: Yes.  See ACP Note from today for additional billable service: No     We appreciate the consult. The Children's Supportive Care Team can be reached by pager [name]) or email ([email or web address]).   ??  ??  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 He has continued to struggle with decisions around his health care, maintaining some focus (or allowing family to do so - he seems ambivalent/exhausted/overwhelmed) on redo liver transplant but not clearly committing to the things needed to get him there. He and his family accepted a hospice referral given his prognosis and need for additional support, and he has been working with a team from [facility] for several months. They reached out to [place] early last week when Nasario had been seen in clinic and declined admission for hydration/evaluation of electrolyte derangement. His family wanted him to be admitted and he was not willing. He was counseled by hospice team that not making a decision about what he wanted and staying home was effectively making a decision, and that he may not survive this illness. His symptoms progressed as the week went on and comfort medications were made available; hospice team continued to reach out and make visits.  Family continued to urge him to seek emergency care and he ultimately agreed - mother shared that it wasn't so much agreeing as stopping disagreeing, but because he has not clearly stated that he is ready to stop pursuing life extending treatments or to let the possibility of transplant go, they opted to bring him to the ER.      Interval history:  MRI done over night. Family updated on results and considering comfort focused care. See above for further details.  ??   Allergies:  Bee pollen and Pollen extracts  ??  Medications:  Scheduled Meds:  ??? hydrocortisone sod succ  50 mg Intravenous Q6H   ??? levetiracetam  500 mg Intravenous Q12H SCH     Continuous Infusions:    PRN Meds:.acetaminophen, albuterol, bisacodyL, carboxymethylcellulose sodium, glycopyrrolate **OR** glycopyrrolate, haloperidol lactate, LORazepam, MORPhine injection, ocular lubricant    Physical Exam:   Vitals: 01/10/20 1400   BP: 114/67   Pulse: 105   Resp: 28   Temp:    SpO2: 100%   ??  Gen: chronically ill young man lying in bed, unresponsive to voice/touch, no obvious distress.   Skin: jaundiced, pale  HEENT: eyelids edematous, OP clear, MMM, patches of alopecia (recent haircut makes this apparent), audible stertor/secretions  Chest: normal WOB  Abd: protruberant but thin relative to last exam a month ago  Ext: no LE/UE edema  Neuro: unresponsive, no current seizure activity noted    Data: Reviewed test results in Epic.  Lab Results   Component Value Date    WBC 12.5 (H) 01/10/2020    HGB 7.6 (L) 01/10/2020    HCT 21.7 (L) 01/10/2020    PLT 106 (L) 01/10/2020       Lab Results   Component Value Date    NA 148 (H) 01/10/2020    NA 147 (H) 01/10/2020    K 3.2 (L) 01/10/2020    K 3.2 (L) 01/10/2020    CL 118 (H) 01/10/2020    CO2  01/10/2020      Comment:      Specimen icteric.    BUN 40 (H) 01/10/2020    CREATININE 1.92 (H) 01/10/2020    GLU 230 (H) 01/10/2020    CALCIUM 8.1 (L) 01/10/2020    MG 1.9 01/10/2020    PHOS 2.5 (L) 01/10/2020       Lab Results   Component Value Date    BILITOT 40.7 (H) 01/10/2020    BILIDIR 35.30 (H) 01/10/2020    PROT  01/10/2020      Comment:      Specimen icteric.    ALBUMIN 2.9 (L) 01/10/2020    ALT  01/10/2020      Comment:      Specimen icteric.    AST 185 (H) 01/10/2020    ALKPHOS 726 (H) 01/10/2020    GGT 359 (H) 01/03/2020       Lab Results   Component Value Date    PT 28.6 (H) 01/10/2020    INR 2.51 01/10/2020    APTT 47.7 (H) 01/10/2020       Imaging reviewed.    Vertell Limber, MD  Attending Physician, Children's Supportive Care Team

## 2020-01-10 NOTE — Unmapped (Signed)
Pediatric Vancomycin Therapeutic Monitoring Pharmacy Note    [name] Gross is a 19 y.o. male continuing vancomycin. Date of therapy initiation: 01/07/20    Indication: Sepsis R/O    Prior Dosing Information: Current regimen vancomycin dosed by level. Last dose vancomycin 1000 mg (~20 mg/kg) IV x 1 dose on 3/1 @ 1407    Dosing Weight: 52.9 kg    Goals:  Therapeutic Drug Levels  Vancomycin random goal: <20 mg/L to redose    Additional Clinical Monitoring/Outcomes  Renal function, volume status (intake and output)    Results:  ?? Vancomycin random level: 23.6 mg/L, drawn appropriately (~19 hour level) on 3/2 @ 0904    Wt Readings from Last 1 Encounters:   01/08/20 51.8 kg (114 lb 3.2 oz) (1 %, Z= -2.21)*     * Growth percentiles are based on CDC (Boys, 2-20 Years) data.     Creatinine   Date Value Ref Range Status   01/10/2020 1.92 (H) 0.70 - 1.30 mg/dL Final   14/78/2956 2.13 (H) 0.70 - 1.30 mg/dL Final   08/65/7846 9.62 (H) 0.70 - 1.30 mg/dL Final        UOP (24 hour average): 0.9 mL/kg/hr    Pharmacokinetic Considerations and Significant Drug Interactions:  ? Dosed per pediatric guideline and dosing by levels  ? Concurrent nephrotoxic meds: vasoactive agents, tacrolimus, valganciclovir (ppx), SMX/TMP (ppx)    Assessment/Plan:  Today's level was collected appropriately and resulted as a still supratherapeutic level, therefore he has not cleared the last dose. Given plans for antibiotics to fall off today anyway will not re-dose or reschedule another level check.     Recommendation(s)  ? Do not re-dose vancomycin at this time. Sepsis R/O period to end later today.    ? Maintain adequate hydration. Patient is currently receiving fluids at 75 mL/hr     Follow-up  ? No additional levels warranted or ordered   ? A pharmacist will continue to monitor and order levels as appropriate    Please page service pharmacist with questions/clarifications.    Lars Pinks, PharmD   Pediatric Clinical Pharmacist  [phone] (pager)  [phone] (pediatric pharmacy)

## 2020-01-10 NOTE — Unmapped (Signed)
N: Pupils round and reactive, scleral edema and jaundice. Clonus in the lower extremities, tremors in bilateral hands. Responds to painful stimulus in lower extremities. No response to voice or commands.   R: 2L [place] for comfort. Lung sounds crackles. No desat events. Snoring with obstruction. Has a strong cough.   CV: Afebrile. HR 80's-110's. BP 90's-120's goal maps >60. Norepi off since this morning. 20pt differential between cuff and a-line. K+ replaced. WWP.  GI/GU: NG in right nare clamped with bright red on aspiration. Foley remains in place. BM x1 this shift, with lactulose enema given. Bloody stool today. Insulin gtt started for BG 240's   Mom, dad, and grandmother were updated on plan of care today. Plan for Brain MRI before tomorrows family meeting. Questions and concerns were addressed. No further needs at this time.   Problem: Adult Inpatient Plan of Care  Goal: Plan of Care Review  Outcome: Not Progressing  Goal: Patient-Specific Goal (Individualization)  Outcome: Not Progressing  Goal: Absence of Hospital-Acquired Illness or Injury  Outcome: Not Progressing  Goal: Optimal Comfort and Wellbeing  Outcome: Not Progressing  Goal: Readiness for Transition of Care  Outcome: Not Progressing  Goal: Rounds/Family Conference  Outcome: Not Progressing     Problem: Self-Care Deficit  Goal: Improved Ability to Complete Activities of Daily Living  Outcome: Not Progressing     Problem: Fall Injury Risk  Goal: Absence of Fall and Fall-Related Injury  Outcome: Not Progressing     Problem: Skin Injury Risk Increased  Goal: Skin Health and Integrity  Outcome: Not Progressing     Problem: Pain Chronic (Persistent) (Comorbidity Management)  Goal: Acceptable Pain Control and Functional Ability  Outcome: Not Progressing

## 2020-01-10 NOTE — Unmapped (Signed)
[name] Gross remains a patient in the PICU. Mom at bedside and very involved with care. Dad and grandma visited this evening. Family is appropriately anxious and tearful regarding pts current condition, discussion of MRI results, and plan of care. They would like to have meeting today. Mom states she does not want him to suffer if he will have no quality of life.     Neuro: pt remains obtunded and unresponsive. He does not respond to commands, speak, make needs known, etc. PERRLA but sluggish. He occasionally withdraws to pain. He has tremors of the hands and clonus of his lower extremities. He is starting to get contracted. He groans with turns and cares sometimes but does not have purposeful attempts at speech or movement. Eyes are yellow.     Resp: remains on 2L [place]. Lungs continue to have crackles. No de-sats overnight. Continues to have wet cough. Continues to snore/sound obstructed when asleep.    Cardiac: remains afebrile. HR 100-110. Bps stable and MAP always greater than goal >65. Sys 100-120. Warm and well perfused with good cap refill. Finished FFP tonight. Checked blood glucose hourly at start of shift. Reached 88 by 10pm when he was about to go to MRI, so turned off per MD instruction. Restarted this AM once blood sugar reached >200. Gave one K so far tonight. Continues to receive albumin    GI/GU: abd soft and round. Hypoactive bowel sounds. Continues to have frequent tarry/bloody stools. G tube aspiration shows frank blood. Team is aware. Remains NPO. Continues on carafate and lactulose. NG tube in place R nare. Foley in place. Putting out approx 75 ml/hr of dark urine. Foley care performed.    Skin: periorbital edema. No other skin injury noted.

## 2020-01-10 NOTE — Unmapped (Signed)
PICU Progress Note    Interval events: Due to CT head with concerns for stroke, an MRI was obtained the night of 3/1, which showed evidence of multiple embolic infarcts, possible ICA tortuosity, and no evidence of any mycotic aneurysm. Goals of care discussion and supportive care meeting with PICU, [facility] team, and neurology and family today reviewing interval events. Mother and family expressed wishes to pursue palliative and comfort care. Ideally transition to inpatient/outpatient hospice. Family also would like to pursue DNR/DNI. Discontinued non-critical meds since transitioning to supportive care; keppra and hydrocortisone continuing.     LOS: 2 days    Assessment: [name] Gross is a 19 y.o. 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 Given MRI results are consistent with embolic infarcts as seen on CT, hypercoagulable state and no fevers or positive blood cultures, this likely represents nonbacterial source. Based on continued worsening of neurologic exam, as well as known liver failure and other co morbidities, without possibility of transplant, [name] Gross is unlikely to make a significant recovery. As a result, family has decided to transition [name] Gross to comfort care. Goals of Care discussion 3/2 with palliative care team and family resulted family choosing [name] Gross' wishes of being placed on DNR/DNI, and preparing for transfer to inpatient hospice. Family expressed wishes to discontinue non-critical labs and medications; during meeting 3/2 family expressed wishes to neither prolong discomfort nor expedite passing. Will give morphine and ativan for air hunger per palliative recs. He recently developed seizure-like activity, per neurology recommendations, will pursue keppra load and continue maintenance therapy with ativan PRN. Will transfer to inpatient hospice in the morning once facility finds it safe for [name] Gross' arrival.    RESP: history of bipap due to obstruction, on RA  - No oxygen requirement   - albuterol PRN    CV: history of tachycardia, hypotension, requiring NE  - CRM    Renal: AKI vs hepatorenal  - I/Os  - Keep foley in place for comfort    FEN/GI: chronic liver rejection, no longer transplant candidate, discontinue mIVF, NG, recent bloody emesis, discontinue tacro, MMF  - PRN dulcolax, robinul    Heme:  - Discontinue SCD prophylaxis    ID:  - S/p cefepime/flagyl/vanc, discontinued with bcx ngtd (72 hours)  - discontinued home prophylaxis valganciclovir, bactrim    Endocrine:  - Continue hydrocortisone to keep patient stable through transport to outside hospice.    Neuro: embolic strokes, seizures  - s/p keppra load 1g, 500mg  BID  - PRN ativan for sz, anxiety  - PRN haldol agitation, delirium, nausea  - PRN morphine pain, air hunger    Social: DNR/DNI  - After conversations with palliative team and family on goals of care, decided to transport to inpatient hospice, after remaining family members can visit.    Access: PIV    DVT ppx: SCDs     Changes to Lines/Tubes: None   Family communication: Updated at bedside, Goals of Care discussion with mother, father, paternal grandmother, and maternal grandfather present.   Dispo: Transfer to Floor for Comfort Care    PICU Resident Pager: [phone]  PICU Resident Phone: [phone]    Vitals:  Dosing weight:    Most recent weight: 51.8 kg (114 lb 3.2 oz) (01/08/20 0005)    Temp:  [36.6 ??C (97.9 ??F)-37.1 ??C (98.8 ??F)] 36.6 ??C (97.9 ??F)  Heart Rate:  [91-121] 116  SpO2 Pulse:  [  102-122] 109  Resp:  [12-24] 20  BP: (102-125)/(45-72) 125/64  MAP (mmHg):  [63-85] 82  A BP-2: (109-132)/(57-80) 119/70  MAP:  [74 mmHg-95 mmHg] 85 mmHg  FiO2 (%):  [100 %] 100 %  SpO2:  [100 %] 100 %     I/O       02/28 0701 - 03/01 0700 03/01 0701 - 03/02 0700 03/02 0701 - 03/03 0700    I.V. (mL/kg) 2914.6 (56.3) 1879.7 (36.3) 309.2 (6)    Blood 215 560.7     NG/GT 10 31     IV Piggyback 608 1208     Total Intake(mL/kg) 3747.6 (72.3) 3679.4 (71) 309.2 (6)    Urine (mL/kg/hr) 3232 (2.6) 1555 (1.3) 150 (0.7)    Stool  468     Total Output 3232 2023 150    Net +515.6 +1656.4 +159.2           Stool Occurrence  1 x            Physical Exam:  General: ill-appearing, immobile, does not respond to verbal stimuli  HEENT: Sclera icteric, oculocephalic reflex present. Left eye worsening asymmetric gaze.  CV: tachycardic, normal rhythm, 2+ pulses  Resp: coarse breath sounds, no increased WOB  Abd: soft, non-tender, non-distended  Ext: withdraws RLE to pain, other extremities immobile.   Neuro: Not alert, not oriented. Does not follow commands. Facial expression symmetric.     Labs and studies reviewed. Pertinent results include:  WBC 12.5 (11.4 ANC)  HGB 7.6 (stable since pRBC's given)  Platelets 106 (down from 118)    Na: 147  K: 3.2  Cl: 118  BUN: 40  Cr: 1.92  Glucose: 230    TBili: 40.7  DBili: 35.3  AST: 185  D-dimer: 813    Lines/Tubes:   Patient Lines/Drains/Airways Status    Active Active Lines, Drains, & Airways     Name:   Placement date:   Placement time:   Site:   Days:    NG/OG Tube Feedings 14 Fr. Left nostril   01/08/20    1100    Left nostril   2    Urethral Catheter Coude 14 Fr.   01/08/20    1300    Coude   1    Peripheral IV 01/08/20 Posterior;Right Hand   01/08/20    0000    Hand   2    Peripheral IV 01/08/20 Left;Posterior Hand   01/08/20    0000    Hand   2    Peripheral IV (Ped) 01/08/20 Left Antecubital   01/08/20    ???     2    Arterial Line 01/08/20 Left Radial   01/08/20    0935    Radial   2                [name] Gross, MS4  [employer]    I attest that I have reviewed the student note and that the components of the history of the present illness, the physical exam, and the assessment and plan documented were performed by me or were performed in my presence by the student where I verified the documentation and performed (or re-performed) the exam and medical decision making.     Kayren Eaves, MD  [facility], PGY-2

## 2020-01-10 NOTE — Unmapped (Signed)
Follow-up Consult Note        Requesting Attending Physician:  [name] Field, MD  Service Requesting Consult: Pediatric ICU (PMS)     Assessment and Plan          [name] Gross is a 19 y.o. male with cirrhosis possibly secondary to [name] Gross s/p transplant in 2017, chronic graft rejection, esophageal varices, SCC trait and recent admission for acute respiratory failure current here for AMS in the setting of possible sepsis on whom I have been asked by [name] Field, MD to consult for stroke.  ??  Stroke:  At this time [name] Gross' exam is slightly worse than yesterday and his MRI shows diffuse embolic infarcts with his right brain affected more than his left.  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  Given his hypercoagulable state and no positive blood cultures or fevers this likely represents nonbacterial and could be the source of his embolic strokes.  At this time based on exam and imaging he has severe damage to the brain and would be unlikely to make a significant recovery even without other comorbidities underlying.  Given his liver failure and inability to have a liver transplant now because of the strokes he is even less likely to make a meaningful recovery from this event.  Unfortunately at this time there is no intervention from a neurology standpoint that would necessarily prevent further strokes nor be able to help address the strokes have already taken place.  This was communicated with both the primary ICU team as well as the patient's mother.    RECOMMENDATIONS:   1. No further neurologic imaging or interventions at this time    This patient was discussed with Dr. Seward Gross who agrees with the above assessment and plan.      [name] Bamberg, MD  Neurology, PGY 3  Consult Pager: [phone]         Subjective      Reason for Consult: stroke    Unable to provide subjective         Objective        Temp: [36.6 ??C-37.1 ??C] 36.6 ??C  Heart Rate:  [102-121] 111  SpO2 Pulse:  [101-122] 104  Resp:  [12-23] 20  BP: (102-118)/(45-72) 103/52  MAP (mmHg):  [63-85] 67  A BP-1: (128)/(63) 128/63  MAP:  [80 mmHg] 80 mmHg  A BP-2: (109-145)/(57-74) 113/63  MAP:  [74 mmHg-92 mmHg] 78 mmHg  SpO2:  [100 %] 100 %  No intake/output data recorded.    Physical Exam: no sedation  General Appearance:Chronically ill appearing.  Not sedated, unresponsive.   HEENT: Head is atraumatic and normocephalic.  Scleral icterus present bilaterally.  Neck: Supple. Grossly normal range of motion.  Lungs: Normal work of breathing on nasal cannula, symmetric chest rise.  Heart: Warm and well-perfused, normal rate.  Abdomen: Nondistended.  Extremities: No clubbing, cyanosis, or edema. Diffusely jaundiced..    Neurological Examination:     Mental Status: Patient does not arouse to verbal or noxious stimuli. Unable to assess speech due to current level of arousal. Does not follow commands to open eyes raise their thumb.  Make some moaning noises and grimaces to noxious stimulus.     Cranial Nerves: Pupil 5 and sluggishly reactive on left, 4 and more briskly reactive on right. No blink to threat. Oculocephalic reflex is present and intact. Corneal reflex present on left, not on right. Face symmetric at rest and with spontaneous  activation.    Motor Exam: Globally decreased bulk.  Increased tone in the bilateral upper and lower extremities.  RUE: Flicker of movement to noxious stimulus.  LUE: Flicker of movement to noxious stimulus.  RLE: withdraws to noxious stimulus.  LLE: Flicker of movement to noxious stimulus.      Reflexes:   R L   Biceps +3 +3   Triceps +3 +3   Patella +4 +4   Achilles +4 +4   Toes are Mute bilaterally. Sustained clonus at the knees and ankles bilaterally    Sensory: Response to noxious stimulus as above.    Cerebellar/Coordination/Gait: Unable to assess due to mental status.          Medications:  Scheduled medications:   ??? albumin human  25 g Intravenous TID   ??? Cefepime  2 g Intravenous Q12H   ??? hydrocortisone sod succ  50 mg Intravenous Q6H   ??? lactulose  10 g Enteral tube: gastric  BID   ??? metronidazole  250 mg Intravenous Q8H   ??? pantoprazole (PROTONIX) intravenous solutio  40 mg Intravenous BID   ??? phytonadione (AQUA-MEPHYTON) intravenous  10 mg Intravenous Daily   ??? sucralfate  1 g Oral Q6H SCH   ??? sulfamethoxazole-trimethoprim  1 tablet Oral Once per day on Mon Wed Fri   ??? Tacrolimus  0.5 mg Enteral tube: gastric  BID   ??? valGANciclovir  450 mg Enteral tube: gastric  Daily     Continuous infusions:   ??? heparin (porcine) 3 mL/hr (01/10/20 0600)   ??? insulin regular (HumuLIN R,NovoLIN R) PEDIATRIC >= 5 kg infusion 0.02 Units/kg/hr (01/10/20 0600)   ??? sodium chloride 3 mL/hr (01/10/20 0600)   ??? PEDIATRIC Custom IV infusion builder 75 mL/hr (01/10/20 0616)     PRN medications: albuterol, carboxymethylcellulose sodium, potassium chloride **OR** potassium chloride, ocular lubricant     Diagnostic Studies      All Labs Last 24hrs:   Recent Results (from the past 24 hour(s))   Basic Metabolic Panel    Collection Time: 01/09/20  8:02 AM   Result Value Ref Range    Sodium 141 135 - 145 mmol/L    Potassium 2.8 (L) 3.5 - 5.0 mmol/L    Chloride 113 (H) 98 - 107 mmol/L    CO2      Anion Gap      BUN 42 (H) 7 - 21 mg/dL    Creatinine 1.61 (H) 0.70 - 1.30 mg/dL    BUN/Creatinine Ratio 22     EGFR CKD-EPI Non-African American, Male 49 (L) >=60 mL/min/1.35m2    EGFR CKD-EPI African American, Male 65 (L) >=60 mL/min/1.68m2    Glucose 248 (H) 70 - 179 mg/dL    Calcium 7.8 (L) 8.5 - 10.2 mg/dL   Magnesium Level    Collection Time: 01/09/20  8:02 AM   Result Value Ref Range    Magnesium 2.0 1.6 - 2.2 mg/dL   Phosphorus Level    Collection Time: 01/09/20  8:02 AM   Result Value Ref Range    Phosphorus 3.6 2.9 - 4.7 mg/dL   Tacrolimus Level, Trough    Collection Time: 01/09/20  8:02 AM   Result Value Ref Range    Tacrolimus, Trough 2.2 (L) 5.0 - 15.0 ng/mL Vancomycin, Random    Collection Time: 01/09/20  8:02 AM   Result Value Ref Range    Vancomycin Rm 20.2 Undefined ug/mL   Blood Gas Critical Care Panel, Arterial    Collection  Time: 01/09/20  3:47 PM   Result Value Ref Range    Specimen Source Arterial     FIO2 Arterial Not Specified     pH, Arterial 7.46 (H) 7.35 - 7.45    pCO2, Arterial 24.2 (L) 35.0 - 45.0 mm Hg    pO2, Arterial 169.0 (H) 80.0 - 110.0 mm Hg    HCO3 (Bicarbonate), Arterial 17 (L) 22 - 27 mmol/L    Base Excess, Arterial -6.1 (L) -2.0 - 2.0    O2 Sat, Arterial 98.3 94.0 - 100.0 %    Sodium Whole Blood 143 135 - 145 mmol/L    Potassium, Bld 2.4 (LL) 3.4 - 4.6 mmol/L    Calcium, Ionized Arterial 4.34 (L) 4.40 - 5.40 mg/dL    Glucose Whole Blood 277 (H) 70 - 179 mg/dL    Lactate, Arterial 1.7 (H) <1.3 mmol/L    Hgb, blood gas 6.80 (L) 13.50 - 17.50 g/dL   Basic Metabolic Panel    Collection Time: 01/09/20  3:47 PM   Result Value Ref Range    Sodium 146 (H) 135 - 145 mmol/L    Potassium 2.6 (LL) 3.5 - 5.0 mmol/L    Chloride 116 (H) 98 - 107 mmol/L    CO2      Anion Gap      BUN 43 (H) 7 - 21 mg/dL    Creatinine 0.98 (H) 0.70 - 1.30 mg/dL    BUN/Creatinine Ratio 22     EGFR CKD-EPI Non-African American, Male 48 (L) >=60 mL/min/1.64m2    EGFR CKD-EPI African American, Male 55 (L) >=60 mL/min/1.77m2    Glucose 268 (H) 70 - 179 mg/dL    Calcium 8.1 (L) 8.5 - 10.2 mg/dL   Magnesium Level    Collection Time: 01/09/20  3:47 PM   Result Value Ref Range    Magnesium 2.1 1.6 - 2.2 mg/dL   Phosphorus Level    Collection Time: 01/09/20  3:47 PM   Result Value Ref Range    Phosphorus 3.2 2.9 - 4.7 mg/dL   Hepatic Function Panel    Collection Time: 01/09/20  3:47 PM   Result Value Ref Range    Albumin 3.1 (L) 3.5 - 5.0 g/dL    Total Protein      Total Bilirubin 37.5 (H) 0.0 - 1.2 mg/dL    Bilirubin, Direct 11.91 (H) 0.00 - 0.40 mg/dL    AST 478 (H) 19 - 55 U/L    ALT      Alkaline Phosphatase 713 (H) 65 - 260 U/L   CBC w/ Differential    Collection Time: 01/09/20 3:47 PM   Result Value Ref Range    WBC 12.4 (H) 4.5 - 11.0 10*9/L    RBC 2.56 (L) 4.50 - 5.90 10*12/L    HGB 7.5 (L) 13.5 - 17.5 g/dL    HCT 29.5 (L) 62.1 - 53.0 %    MCV 86.4 80.0 - 100.0 fL    MCH 29.5 26.0 - 34.0 pg    MCHC 34.1 31.0 - 37.0 g/dL    RDW 30.8 (H) 65.7 - 15.0 %    MPV 6.7 (L) 7.0 - 10.0 fL    Platelet 118 (L) 150 - 440 10*9/L    Variable HGB Concentration Moderate (A) Not Present    Neutrophils % 92.9 %    Lymphocytes % 2.8 %    Monocytes % 3.4 %    Eosinophils % 0.0 %    Basophils % 0.5 %  Neutrophil Left Shift 3+ (A) Not Present    Absolute Neutrophils 11.5 (H) 2.0 - 7.5 10*9/L    Absolute Lymphocytes 0.3 (L) 1.5 - 5.0 10*9/L    Absolute Monocytes 0.4 0.2 - 0.8 10*9/L    Absolute Eosinophils 0.0 0.0 - 0.4 10*9/L    Absolute Basophils 0.1 0.0 - 0.1 10*9/L    Large Unstained Cells 0 0 - 4 %    Microcytosis Moderate (A) Not Present    Anisocytosis Marked (A) Not Present    Hyperchromasia Slight (A) Not Present   aPTT    Collection Time: 01/09/20  3:47 PM   Result Value Ref Range    APTT 49.5 (H) 25.3 - 37.1 sec    Heparin Correlation 0.3    PT-INR    Collection Time: 01/09/20  3:47 PM   Result Value Ref Range    PT 29.6 (H) 10.5 - 13.5 sec    INR 2.60    Fibrinogen    Collection Time: 01/09/20  3:47 PM   Result Value Ref Range    Fibrinogen 163 (L) 177 - 386 mg/dL   D-Dimer, Quantitative    Collection Time: 01/09/20  3:47 PM   Result Value Ref Range    D-Dimer 513 (H) <230 ng/mL DDU   Morphology Review    Collection Time: 01/09/20  3:47 PM   Result Value Ref Range    Smear Review Comments See Comment (A) Undefined    Schistocytes Moderate (AA) Not Present    Burr Cells Present (A) Not Present    Acanthocytes Present (A) Not Present    Hypersegmented Neutrophils Present (A) Not Present   POCT Glucose    Collection Time: 01/09/20  4:12 PM   Result Value Ref Range    Glucose, POC 242 (H) 70 - 179 mg/dL   POCT Glucose    Collection Time: 01/09/20  5:26 PM   Result Value Ref Range    Glucose, POC 230 (H) 70 - 179 mg/dL   Prepare Plasma    Collection Time: 01/09/20  6:21 PM   Result Value Ref Range    Unit Blood Type AB Pos     ISBT Number 8400     Unit # [id number]     Status Issued     Product ID FFP     PRODUCT CODE [id number]    POCT Glucose    Collection Time: 01/09/20  6:44 PM   Result Value Ref Range    Glucose, POC 177 70 - 179 mg/dL   POCT Glucose    Collection Time: 01/09/20  8:06 PM   Result Value Ref Range    Glucose, POC 102 70 - 179 mg/dL   POCT Glucose    Collection Time: 01/09/20  9:20 PM   Result Value Ref Range    Glucose, POC 87 70 - 179 mg/dL   Basic Metabolic Panel    Collection Time: 01/09/20  9:37 PM   Result Value Ref Range    Sodium 151 (H) 135 - 145 mmol/L    Potassium 2.6 (LL) 3.5 - 5.0 mmol/L    Chloride 125 (H) 98 - 107 mmol/L    CO2      Anion Gap      BUN 35 (H) 7 - 21 mg/dL    Creatinine 9.56 (H) 0.70 - 1.30 mg/dL    BUN/Creatinine Ratio 19     EGFR CKD-EPI Non-African American, Male 53 (L) >=60 mL/min/1.54m2    EGFR CKD-EPI African American,  Male 62 >=60 mL/min/1.58m2    Glucose 132 70 - 179 mg/dL    Calcium 7.2 (L) 8.5 - 10.2 mg/dL   Magnesium Level    Collection Time: 01/09/20  9:37 PM   Result Value Ref Range    Magnesium 1.7 1.6 - 2.2 mg/dL   Phosphorus Level    Collection Time: 01/09/20  9:37 PM   Result Value Ref Range    Phosphorus 8.6 (H) 2.9 - 4.7 mg/dL   POCT Glucose    Collection Time: 01/09/20 11:13 PM   Result Value Ref Range    Glucose, POC 99 70 - 179 mg/dL   POCT Glucose    Collection Time: 01/10/20  2:02 AM   Result Value Ref Range    Glucose, POC 153 70 - 179 mg/dL   Blood Gas Critical Care Panel, Arterial    Collection Time: 01/10/20  3:58 AM   Result Value Ref Range    Specimen Source Arterial     FIO2 Arterial Not Specified     pH, Arterial 7.48 (H) 7.35 - 7.45    pCO2, Arterial 24.3 (L) 35.0 - 45.0 mm Hg    pO2, Arterial 158.0 (H) 80.0 - 110.0 mm Hg    HCO3 (Bicarbonate), Arterial 18 (L) 22 - 27 mmol/L    Base Excess, Arterial -5.3 (L) -2.0 - 2.0    O2 Sat, Arterial 99.7 94.0 - 100.0 %    Sodium Whole Blood 148 (H) 135 - 145 mmol/L    Potassium, Bld 3.2 (L) 3.4 - 4.6 mmol/L    Calcium, Ionized Arterial 4.44 4.40 - 5.40 mg/dL    Glucose Whole Blood 250 (H) 70 - 179 mg/dL    Lactate, Arterial 2.0 (H) <1.3 mmol/L    Hgb, blood gas 6.30 (L) 13.50 - 17.50 g/dL   Basic Metabolic Panel    Collection Time: 01/10/20  3:58 AM   Result Value Ref Range    Sodium 147 (H) 135 - 145 mmol/L    Potassium 3.2 (L) 3.5 - 5.0 mmol/L    Chloride 118 (H) 98 - 107 mmol/L    CO2      Anion Gap      BUN 40 (H) 7 - 21 mg/dL    Creatinine 1.61 (H) 0.70 - 1.30 mg/dL    BUN/Creatinine Ratio 21     EGFR CKD-EPI Non-African American, Male 49 (L) >=60 mL/min/1.67m2    EGFR CKD-EPI African American, Male 73 (L) >=60 mL/min/1.67m2    Glucose 230 (H) 70 - 179 mg/dL    Calcium 8.1 (L) 8.5 - 10.2 mg/dL   Magnesium Level    Collection Time: 01/10/20  3:58 AM   Result Value Ref Range    Magnesium 1.9 1.6 - 2.2 mg/dL   Phosphorus Level    Collection Time: 01/10/20  3:58 AM   Result Value Ref Range    Phosphorus 2.5 (L) 2.9 - 4.7 mg/dL   Hepatic Function Panel    Collection Time: 01/10/20  3:58 AM   Result Value Ref Range    Albumin 2.9 (L) 3.5 - 5.0 g/dL    Total Protein      Total Bilirubin 40.7 (H) 0.0 - 1.2 mg/dL    Bilirubin, Direct 09.60 (H) 0.00 - 0.40 mg/dL    AST 454 (H) 19 - 55 U/L    ALT      Alkaline Phosphatase 726 (H) 65 - 260 U/L   CBC w/ Differential    Collection Time: 01/10/20  3:58  AM   Result Value Ref Range    Results Verified by Slide Scan Slide Reviewed     WBC 12.5 (H) 4.5 - 11.0 10*9/L    RBC 2.58 (L) 4.50 - 5.90 10*12/L    HGB 7.6 (L) 13.5 - 17.5 g/dL    HCT 16.1 (L) 09.6 - 53.0 %    MCV 84.3 80.0 - 100.0 fL    MCH 29.6 26.0 - 34.0 pg    MCHC 35.1 31.0 - 37.0 g/dL    RDW 04.5 (H) 40.9 - 15.0 %    MPV 6.6 (L) 7.0 - 10.0 fL    Platelet 106 (L) 150 - 440 10*9/L    Variable HGB Concentration Moderate (A) Not Present    Neutrophils % 91.2 %    Lymphocytes % 2.6 %    Monocytes % 5.2 % Eosinophils % 0.1 %    Basophils % 0.4 %    Neutrophil Left Shift 3+ (A) Not Present    Absolute Neutrophils 11.4 (H) 2.0 - 7.5 10*9/L    Absolute Lymphocytes 0.3 (L) 1.5 - 5.0 10*9/L    Absolute Monocytes 0.7 0.2 - 0.8 10*9/L    Absolute Eosinophils 0.0 0.0 - 0.4 10*9/L    Absolute Basophils 0.1 0.0 - 0.1 10*9/L    Large Unstained Cells 1 0 - 4 %    Microcytosis Marked (A) Not Present    Anisocytosis Marked (A) Not Present    Hyperchromasia Slight (A) Not Present   aPTT    Collection Time: 01/10/20  3:58 AM   Result Value Ref Range    APTT 47.7 (H) 25.3 - 37.1 sec    Heparin Correlation 0.3    PT-INR    Collection Time: 01/10/20  3:58 AM   Result Value Ref Range    PT 28.6 (H) 10.5 - 13.5 sec    INR 2.51    Fibrinogen    Collection Time: 01/10/20  3:58 AM   Result Value Ref Range    Fibrinogen 149 (L) 177 - 386 mg/dL   D-Dimer, Quantitative    Collection Time: 01/10/20  3:58 AM   Result Value Ref Range    D-Dimer 813 (H) <230 ng/mL DDU   Morphology Review    Collection Time: 01/10/20  3:58 AM   Result Value Ref Range    Smear Review Comments See Comment (A) Undefined    Target Cells Moderate (A) Not Present    Schistocytes Moderate (AA) Not Present    Burr Cells Present (A) Not Present    Acanthocytes Present (A) Not Present    Poikilocytosis Marked (A) Not Present   POCT Glucose    Collection Time: 01/10/20  4:07 AM   Result Value Ref Range    Glucose, POC 207 (H) 70 - 179 mg/dL   Prepare Plasma    Collection Time: 01/10/20  7:00 AM   Result Value Ref Range    Unit Blood Type AB Pos     ISBT Number 8400     Unit # [id number]     Status Transfused     Product ID FFP     PRODUCT CODE [id number]    Prepare RBC    Collection Time: 01/10/20  7:00 AM   Result Value Ref Range    Crossmatch Compatible     Unit Blood Type B Pos     ISBT Number 7300     Unit # [id number]     Status Transfused     Product  ID Red Blood Cells     PRODUCT CODE [id number]        MRI brain/MRA TOF Head, 01/09/20, personally reviewed:  MRI brain: --Numerous scattered areas of restricted diffusion throughout the bilateral cerebral and cerebellar hemispheres, consistent with infarcts, likely embolic in origin.    MRA head:  No evidence of mycotic aneurysm on MRA sequences.

## 2020-01-10 NOTE — Unmapped (Signed)
ICU Nightly Progress Note  Interval events: no response to voice today. Family discussion regarding goals of care continued in light of multiple embolic strokes and concern for endocarditis on ECHO today.     LOS: 1 days    Assessment: BLASE BECKNER is a 19 y.o. male with hx of primary sclerosing cholangitis s/p failed liver transplant, also with depression, severe protein-calorie malnutrition, esophageal varices now with sepsis and shock liver.  The reason the patient is critically ill is acute on chronic liver failure, AKI, acute embolic strokes, and concern for endocarditis and the nature of the treatment is antibiotic treatment, wean from vasoactive medications, need for stress steroids, renally dosed medicaitons.    Plan:   - Will obtain MRI head for further prognostication tonight  - continue broad antibiotic regimen- ID invovled  - Renal dosed medications  - IV protonix and karofate 2/2 bloody NG output- watch for significant GI bleed.   - serial CBC, DIC panel, Electrolytes. Replace as required or if bleeding.   - remains full code as of now. Family thinking about Dyer' comfort in making medical decisions. Supportive care team involved.       Vitals:  Dosing weight:    Most recent weight: 51.8 kg (114 lb 3.2 oz) (01/08/20 0005)    Temp:  [36.4 ??C (97.5 ??F)-37 ??C (98.6 ??F)] 37 ??C (98.6 ??F)  Heart Rate:  [97-120] 112  SpO2 Pulse:  [96-118] 112  Resp:  [10-22] 17  BP: (87-118)/(43-72) 110/65  MAP (mmHg):  [55-85] 76  A BP-1: (97-132)/(41-67) 128/63  MAP:  [63 mmHg-85 mmHg] 80 mmHg  A BP-2: (115-145)/(61-74) 129/70  MAP:  [77 mmHg-92 mmHg] 86 mmHg  FiO2 (%):  [30 %] 30 %  SpO2:  [100 %] 100 %     I/O       02/28 0701 - 03/01 0700 03/01 0701 - 03/02 0700    I.V. (mL/kg) 2914.6 (56.3) 1383.9 (26.7)    Blood 215 560.7    NG/GT 10 31    IV Piggyback 608 958    Total Intake 3747.6 2933.7    Urine (mL/kg/hr) 3232 (2.6) 975 (1.2)    Stool  313    Total Output(mL/kg) 3232 (62.4) 1288 (24.9)    Net +515.6 +1645.7                 Physical Exam:  General: obtunded. Unable to arouse. Jaundiced  HEENT: NCAT. Eyes closed, MMM  CV: RRR, no murmurs, 2+ pulses   Resp: CTAB, LFNC in nares.   Abd: soft, mild distension.   Ext: noted jaundice. Cool extremities peripherially  Neuro: withdraw to pain in lower extremities. No reaction to voice or commands.     Labs and studies reviewed. Pertinent results include : MRI head results pending    Lines/Tubes:   Patient Lines/Drains/Airways Status    Active Active Lines, Drains, & Airways     Name:   Placement date:   Placement time:   Site:   Days:    NG/OG Tube Feedings 14 Fr. Left nostril   01/08/20    1100    Left nostril   1    Urethral Catheter Coude 14 Fr.   01/08/20    1300    Coude   1    Peripheral IV 01/08/20 Posterior;Right Hand   01/08/20    0000    Hand   1    Peripheral IV 01/08/20 Left;Posterior Hand   01/08/20    0000  Hand   1    Peripheral IV (Ped) 01/08/20 Left Antecubital   01/08/20    ???     1    Arterial Line 01/08/20 Left Radial   01/08/20    0935    Radial   1                  PEDIATRIC ICU ATTENDING ATTESTATION     The patient was critically ill during the time that I saw the patient. The Critical Care Time excluding procedures was 45 minutes.     Zamani Crocker Dwan Bolt  01/09/2020  10:31 PM

## 2020-01-10 NOTE — Unmapped (Signed)
Pediatric Hospital Medicine (PHM) Discharge Summary    Patient Information:   [name] Gross  Date of Birth: [birth year]    Admission/Discharge Information:     Admit Date: 01/08/2020 Admitting Attending: Evalina Field, MD   Discharge Date: 01/11/2020 Discharge Attending: Andi Devon   Length of Stay: 3 days Discharge Service: Pediatric ICU (PMS)     Disposition: Hospice  **Condition at Discharge:   Comfort care    Final Diagnoses:   Active Problems:    Hypovolemic shock   Resolved Problems:    * No resolved hospital problems. 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 He was recently admitted for ARF, and is now presenting to [facility] from OSH with concern for sepsis or hypovolemic shock, and likely shock liver. Hospital course by system as follows:    Resp: Required intermittent  and BiPAP for obstructive breathing, but no episodes of hypoxia. No oxygen requirements upon discharge.    CV: Presented with hypotension, required NE to maintain adequate MAPs > 65, weaned off by 3/1. Given multiple embolic infarcts noted on head CT, echo was ordered and completed on 3/1 and showed a possible papillary vegetation in the LV.     Renal: Noted to have R hydronephrosis on 2/28 secondary to urinary retention with >1.5L of urine out with foley placement. Hydronephrosis resolved with decompression. Foley remained in place through discharge to hospice care.     FEN/GI: Patient NPO on arrival, given pressors and AMS. NGT placed for meds. Peds GI in transplant hematology consulted to assist with management.  Given albumin 25 g of 25% 3 times daily x3 days.  Home tacrolimus initially continued then discontinued given change in goals of care. Liver Doppler obtained showing normal hepatic transplant vasculature with patent veins. Given ongoing bleeding from NG tube, initially received pantoprazole and Carafate, both discontinued before discharge to hospice. Held fluids and feeds per family's wishes for comfort care.    Heme: Coagulopathic on admission with hemoglobin 7.8, platelets 81.  INR peaked at 3.  Given 10 mg IV vitamin K x3 days, finishing 3/2.  Required 1 unit of PRBCs and FFP on 3/1 for hemoglobin of 6 and elevated coags in setting of active bleeding from NG tube. DIC labs monitored until goals of care discussion.    ID: Transferred on broad spectrum antibiotics, continued vanc/cefepime/flagyl (2/27-3/2). OSH blood and urine cultures remained negative. Discontinued Valgancyclovir and Bactrim upon transition to comfort care. Given transition of care to supportive and no signs of infectious etiology other than possible papillary vegetation noted on echo, forewent consulting peds ID.    Endocrine: Given chronic prednisone the patient was on at home, was placed on stress dose steroids on HD1 given his profound hypotension requiring pressors.  Continued hydrocortisone 50 mg every 6 hours (2/27-discharge) to allow stable transport to hospice facility.    Neuro: Patient presented with profound encephalopathy and altered mental status with known coagulopathy. Obtained head CT accordingly on 2/28, which showed multiple embolic infarcts. Neurology was consulted, who recommended further brain imaging with MRI/MRA, obtained on 3/1, which showed embolic infarcts without concern for mycotic aneurysm.  Given MRI results are consistent with embolic infarcts as seen on CT, hypercoagulable state and no fevers or positive blood cultures, this likely represents nonbacterial source. Based on continued worsening of neurologic exam, as well as known liver failure and other co morbidities, without possibility of transplant, [name] Gross is unlikely to make a significant recovery. As a result, family has decided to transition [name] Gross to comfort care. Goals of Care discussion 3/2 with palliative care team and family resulted family choosing [name] wishes of being placed on DNR/DNI, and preparing for transfer to inpatient hospice. He has morphine 2 mg every 1 hour as needed available for air hunger and discomfort.     Social: Palliative care consulted given they know patient well and has followed previously on prior admissions.  Multiple goals of care conversations, ultimately resulting in decision to change patient status to DNR/DNI, discontinue all non-critical meds, and transport patient to inpatient hospice facility.     Discharge Exam:   BP 114/67  - Pulse 104  - Temp 37.1 ??C (Axillary)  - Resp 28  - Wt 51.8 kg (114 lb 3.2 oz)  - SpO2 100%  - BMI 17.88 kg/m??     General: ill-appearing, immobile, does not respond to verbal stimuli  HEENT: Sclera icteric. Left eye worsening asymmetric gaze.  CV: tachycardic, normal rhythm  Resp: coarse breath sounds, no increased WOB  Abd: soft, non-tender, non-distended  Ext: good pulses bilaterally  Neuro: Not alert, not oriented.     Discharge Medications and Orders:   Discharge Medications:     Your Medication List      STOP taking these medications    albuterol 90 mcg/actuation inhaler  Commonly known as: PROVENTIL HFA;VENTOLIN HFA     ANTACID (CALCIUM CARBONATE) 200 mg calcium (500 mg) chewable tablet  Generic drug: calcium carbonate     benzonatate 100 MG capsule  Commonly known as: TESSALON     clindamycin-benzoyl peroxide 1.2 %(1 % base) -5 % gel     ergocalciferol 1,250 mcg (50,000 unit) capsule  Commonly known as: DRISDOL     hydrOXYzine 25 MG tablet  Commonly known as: ATARAX     magnesium oxide 400 mg (241.3 mg magnesium) tablet  Commonly known as: MAGOX     mirtazapine 7.5 MG tablet Commonly known as: REMERON     MVW Complete (pediatric multivit 61-D3-vit K) 1,500-800 unit-mcg Cap  Commonly known as: MVW COMPLETE FORMULATION     mycophenolate 180 MG EC tablet  Commonly known as: MYFORTIC     ondansetron 4 MG disintegrating tablet  Commonly known as: ZOFRAN-ODT     pantoprazole 40 MG tablet  Commonly known as: PROTONIX     predniSONE 20 MG tablet  Commonly known as: DELTASONE     sulfamethoxazole-trimethoprim 400-80 mg per tablet  Commonly known as: BACTRIM     tacrolimus 0.5 MG capsule  Commonly known as: PROGRAF     valGANciclovir 450 mg tablet  Commonly known as: VALCYTE     VITAMIN B-1 100 MG tablet  Generic drug: thiamine     XIFAXAN 550 mg Tab  Generic drug: rifAXIMin        START taking these medications    acetaminophen 650 MG suppository  Commonly known as: TYLENOL  Insert 1 suppository (650 mg total) into the rectum every four (4) hours as needed for fever (temp > 38.3 C).     bisacodyL 10 mg suppository  Commonly known as: DULCOLAX  Insert 1 suppository (10 mg total) into  the rectum daily as needed.     carboxymethylcellulose sodium 1 % Dpge  Commonly known as: REFRESH CELLUVISC  Administer 1 drop to both eyes 4 (four) times a day as needed.     glycopyrrolate 0.2 mg/mL injection  Commonly known as: ROBINUL  Infuse 1 mL (0.2 mg total) into a venous catheter every six (6) hours as needed (airway secretions/congestion).     glycopyrrolate 0.2 mg/mL injection  Commonly known as: ROBINUL  Inject 1 mL (0.2 mg total) under the skin every six (6) hours as needed (airway secretions/congestion; administer subcutaneous if no IV line is present.).     hydrocortisone sod succ Solr  Commonly known as: Solu-CORTEF  Infuse 1 mL (50 mg total) into a venous catheter Every six (6) hours.     levETIRAcetam 10 mg/mL  injection  Commonly known as: KEPPRA  Infuse 50 mL (500 mg total) into a venous catheter every twelve (12) hours.     MORPhine 2 mg/mL Crtg  Infuse 1 mL (2 mg total) into a venous catheter every hour as needed for up to 5 days.     ocular lubricant Oint  Administer 1 application to both eyes Three (3) times a day as needed (dry eyes).               Discharge Instructions:   Activity:   Activity Instructions     Activity as tolerated          Instructions and Other Follow-ups after Discharge:  Follow Up instructions and Outpatient Referrals     Discharge instructions      It was an honor to meet [name] Gross and all of his loved ones. It was a pleasure to know you, our thoughts and prayers are with you all. Thank you for sharing your time, with blessings, love, and prayers.        Other Instructions:  Other Instructions     Discharge instructions      It was an honor to meet [name] Gross and all of his loved ones. It was a pleasure to know you, our thoughts and prayers are with you all. Thank you for sharing your time, with blessings, love, and prayers.         It was an honor to [name] Gross and all of his loved ones. It was a pleasure to know you, our thoughts and prayers are with you all. Thank you for sharing your time, with blessings, love, and prayers.        Future Appointments:  Appointments which have been scheduled for you    Jan 13, 2020 10:00 AM  (Arrive by 9:30 AM)  RETURN  HEPATOLOGY with Melvern Sample, ANP  [facility] LIVER TRANSPLANT [facility] REGION) [address]  [place] [address]  [phone]      Jan 13, 2020 11:00 AM  (Arrive by 10:30 AM)  URGENT with Theda Sers, PhD  [facility] TRANSPLANT SURGERY [place] [facility] REGION) [address]  [place] [address]  [phone]      Jan 13, 2020  1:00 PM  (Arrive by 12:30 PM)  RETURN NUTRITION with Lanelle Bal, RD/LDN  [facility] NUTRITION SERVICES TRANSPLANT [facility] REGION) [address]  [place] [address]  [phone]      Jan 13, 2020  2:00 PM  (Arrive by 1:30 PM)  LAB ONLY with SURTRA LAB ONLY  [facility] TRANSPLANT SURGERY [place]) [address]  [place] [address]  [phone]      Feb 07, 2020  8:15 AM  (Arrive by 8:05 AM)  RETURN VIDEO - EPIC AMWELL with Addison Lank, MD  [name] PSYCHIATRY Transplant AT [facility] REGION) [address]  [address]  [place] [address]  [phone]   Please sign into My [place] Chart at least 15 minutes before your appointment to complete any unfinished steps in the Get Started process. Get Started is required to be complete prior to your video visit.     Please visit [email or web address] for information about our safe promise as you receive care at [facility].    My [place] chart allows you to manage your health, send messages to your provider, view your test results, schedule and manage appointments, and request prescription refills securely and conveniently from your computer or mobile device.    You can go to [email or web address] to Sign in to your My [place] Chart account with your username and password. If you have forgotten them, please choose the Forgot Username? and/or Forgot Password? links to gain access. You can also access your My [place] Chart account with the free MyChart mobile app for Android or iPhone.    If you need further assistance with accessing your My [place] Chart account or for assistance in reaching your provider's office to reschedule or cancel your appointment  call [facility] at [phone].         Mar 15, 2020 12:00 PM  (Arrive by 11:30 AM)  RETURN VIDEO - OTHER with Ronelle Nigh, FNP  [facility] CHILDRENS HEMATOLOGY ONCOLOGY CANCER HOSP [facility] REGION) [address]  [place] [address]  [phone]             Jerrilyn [place] MD, MS  [facility] Pediatric[facility], PGY3

## 2020-01-10 NOTE — Unmapped (Signed)
ADVANCE CARE PLANNING NOTE    Discussion Date:  January 10, 2020    Patient has decisional capacity:  No    Patient has selected a Health Care Decision-Maker if loses capacity: Yes    Health Care Decision Maker as of 01/10/2020      Discussion Participants:  Parents  Grandparents    Communication of Medical Status/Prognosis:   New embolic infarcts  Liver failure with ongoing rejection and not a transplant candidate    Communication of Treatment Goals/Options:   Comfort is priority for family  Quality of life is important for family - ability to walk, feed himself    Treatment Decisions:   DNR/DNI - order entered into EPIC  Transfer to inpatient hospice - will work to arrange this for today  Will not start enteral feeds, limit medications that would not add to his comfort, and provide medications as necessary for comfort and air hunger    I spent between 46-75 minutes providing voluntary advance care planning services for this patient.

## 2020-01-11 MED ORDER — GENERIC EXTERNAL MEDICATION
Status: DC
Start: ? — End: 2020-01-11

## 2020-01-11 MED ORDER — MORPHINE SULFATE 2 MG/ML IJ SOLN
2.00 | INTRAMUSCULAR | Status: DC
Start: ? — End: 2020-01-11

## 2020-01-11 MED ORDER — LEVETIRACETAM IN NACL 1000 MG/100ML IV SOLN
500.00 | INTRAVENOUS | Status: DC
Start: 2020-01-11 — End: 2020-01-11

## 2020-01-11 MED ORDER — ACETAMINOPHEN 650 MG RE SUPP
650.00 | RECTAL | Status: DC
Start: ? — End: 2020-01-11

## 2020-01-11 MED ORDER — GENERIC EXTERNAL MEDICATION
0.50 | Status: DC
Start: ? — End: 2020-01-11

## 2020-01-11 MED ORDER — LORAZEPAM 2 MG/ML IJ SOLN
1.00 | INTRAMUSCULAR | Status: DC
Start: ? — End: 2020-01-11

## 2020-01-11 MED ORDER — BISACODYL 10 MG RE SUPP
10.00 | RECTAL | Status: DC
Start: ? — End: 2020-01-11

## 2020-01-11 MED ORDER — MORPHINE 2 MG/ML INJECTION PF WRAPPER
INTRAVENOUS | 0 refills | 0.00000 days | PRN
Start: 2020-01-11 — End: 2020-01-16

## 2020-01-11 NOTE — Unmapped (Signed)
MD orders signed by Dr. Jessee Avers and faxed to [facility] at [phone].

## 2020-01-11 NOTE — Unmapped (Signed)
[name] Gross remains a patient in the PICU. He remains on comfort care following liver transplant and stroke complications. The plan is for him to go to [facility] in [place] this morning at 9:30. Visited by family members this evening. Everybody was appropriate and grateful to staff.    Neuro: continues to be unresponsive. Pupils unequal and non-reactive. Unresponsive to pain. Remains stiff. No spontaneous movement. No notable seizure activity seen.    Resp: remains on 2L [place] for comfort. No de-sats overnight. Lungs remain with crackles and rattling.     Cardiac: delayed cap refill but overall warm. HR 80-120 with occasional irregularity and drops in rate to 50s briefly.      GI/GU: continues to have small tarry stools. Foley in place draining approx 50-75 mL dark urine per hour. Abd soft    Skin: dry, no wounds    Access: 2 PIV

## 2020-01-11 NOTE — Unmapped (Signed)
Pt remains in PICU. At the family meeting this afternoon it was decided to transition pt to comfort care and inpatient hospice of family's choosing. Pt plans to transfer to inpatient hospice in the morning. Q shift assessments and monitoring of HR and oxygen saturations only per mothers request, no BP, MD aware.     Neuro: Pt remains obtunded. No response to painful stimuli. Previously in shift pupils were sluggishly reactive but are now unequal and non-reactive. Morphine and ativan given PRN. Pt having episodes of tremors, eye deviations, and tachycardia, MD notified and ativan given for concern of seizures.     Resp: Pt remains on  for comfort. Continuous to have labored agonal breathing with grunting. No desats. Course and crackly breath sounds.     CV: Afebrile. Labile HR ranging from 70s-150s. BP not taken per mother's request, MD aware. A-line removed per order. 2 PIV in place and patent.    GI/GU: Foley remains in place and draining amber cloudy urine. Pt continues to have black loose stools frequently. NG tube removed per order.    Skin: Pt continues to be jaundiced. Yellow sclera. No new PI noted.    Family at bedside for majority of shift. Updated on plan of care with multiple teams in family meeting. Family at bedside and in PICU family room. Switching out family with a max of 3 family members in room at a time, okayed by MD and management. Will continue to monitor.     Problem: Adult Inpatient Plan of Care  Goal: Plan of Care Review  Outcome: Not Progressing  Goal: Patient-Specific Goal (Individualization)  Outcome: Not Progressing  Goal: Absence of Hospital-Acquired Illness or Injury  Outcome: Not Progressing  Goal: Optimal Comfort and Wellbeing  Outcome: Not Progressing  Goal: Readiness for Transition of Care  Outcome: Not Progressing  Goal: Rounds/Family Conference  Outcome: Not Progressing     Problem: Self-Care Deficit  Goal: Improved Ability to Complete Activities of Daily Living  Outcome: Not Progressing     Problem: Fall Injury Risk  Goal: Absence of Fall and Fall-Related Injury  Outcome: Not Progressing     Problem: Skin Injury Risk Increased  Goal: Skin Health and Integrity  Outcome: Not Progressing     Problem: Pain Chronic (Persistent) (Comorbidity Management)  Goal: Acceptable Pain Control and Functional Ability  Outcome: Not Progressing

## 2020-01-12 LAB — CULTURE, BLOOD (ROUTINE X 2)
Culture: NO GROWTH
Culture: NO GROWTH

## 2020-01-12 NOTE — Unmapped (Signed)
[employer] and spoke with Harriett Sine. Rodolph died peacefully this morning with his mother at his side. Notified providers/staff at [facility].

## 2020-01-12 NOTE — Unmapped (Signed)
-----   Message from [facility] sent at 01/31/2020  1:14 PM EST -----  Regarding: Passed Away  Contact: Minus [phone]  Just calling to let you know that he passed away at 11:22.  Please call if you have any questions.

## 2020-02-09 DEATH — deceased

## 2020-05-29 DIAGNOSIS — Z0271 Encounter for disability determination: Secondary | ICD-10-CM

## 2020-07-12 ENCOUNTER — Encounter: Payer: Self-pay | Admitting: Pediatrics
